# Patient Record
Sex: Female | Born: 1945 | Race: White | Hispanic: No | Smoking: Never smoker
Health system: Southern US, Community
[De-identification: ages and names within clinical notes are randomized; demographics above are authoritative.]

## PROBLEM LIST (undated history)

## (undated) DIAGNOSIS — M069 Rheumatoid arthritis, unspecified: Secondary | ICD-10-CM

## (undated) DIAGNOSIS — E041 Nontoxic single thyroid nodule: Secondary | ICD-10-CM

## (undated) DIAGNOSIS — K429 Umbilical hernia without obstruction or gangrene: Secondary | ICD-10-CM

## (undated) DIAGNOSIS — I5189 Other ill-defined heart diseases: Secondary | ICD-10-CM

## (undated) DIAGNOSIS — J45909 Unspecified asthma, uncomplicated: Secondary | ICD-10-CM

## (undated) DIAGNOSIS — M5090 Cervical disc disorder, unspecified, unspecified cervical region: Secondary | ICD-10-CM

## (undated) DIAGNOSIS — K219 Gastro-esophageal reflux disease without esophagitis: Secondary | ICD-10-CM

## (undated) DIAGNOSIS — G4733 Obstructive sleep apnea (adult) (pediatric): Secondary | ICD-10-CM

## (undated) DIAGNOSIS — N6019 Diffuse cystic mastopathy of unspecified breast: Secondary | ICD-10-CM

## (undated) DIAGNOSIS — J42 Unspecified chronic bronchitis: Secondary | ICD-10-CM

## (undated) DIAGNOSIS — I2699 Other pulmonary embolism without acute cor pulmonale: Secondary | ICD-10-CM

## (undated) DIAGNOSIS — E785 Hyperlipidemia, unspecified: Secondary | ICD-10-CM

## (undated) DIAGNOSIS — K589 Irritable bowel syndrome without diarrhea: Secondary | ICD-10-CM

## (undated) DIAGNOSIS — J449 Chronic obstructive pulmonary disease, unspecified: Secondary | ICD-10-CM

## (undated) DIAGNOSIS — D649 Anemia, unspecified: Secondary | ICD-10-CM

## (undated) DIAGNOSIS — F329 Major depressive disorder, single episode, unspecified: Secondary | ICD-10-CM

## (undated) DIAGNOSIS — J309 Allergic rhinitis, unspecified: Secondary | ICD-10-CM

## (undated) DIAGNOSIS — M2040 Other hammer toe(s) (acquired), unspecified foot: Secondary | ICD-10-CM

## (undated) DIAGNOSIS — M8580 Other specified disorders of bone density and structure, unspecified site: Secondary | ICD-10-CM

## (undated) DIAGNOSIS — N183 Chronic kidney disease, stage 3 unspecified: Secondary | ICD-10-CM

## (undated) DIAGNOSIS — I2510 Atherosclerotic heart disease of native coronary artery without angina pectoris: Secondary | ICD-10-CM

## (undated) DIAGNOSIS — E538 Deficiency of other specified B group vitamins: Secondary | ICD-10-CM

## (undated) DIAGNOSIS — N952 Postmenopausal atrophic vaginitis: Secondary | ICD-10-CM

## (undated) DIAGNOSIS — I471 Supraventricular tachycardia, unspecified: Secondary | ICD-10-CM

## (undated) DIAGNOSIS — R059 Cough, unspecified: Secondary | ICD-10-CM

## (undated) DIAGNOSIS — J039 Acute tonsillitis, unspecified: Secondary | ICD-10-CM

## (undated) DIAGNOSIS — F419 Anxiety disorder, unspecified: Secondary | ICD-10-CM

## (undated) DIAGNOSIS — D539 Nutritional anemia, unspecified: Secondary | ICD-10-CM

## (undated) DIAGNOSIS — M359 Systemic involvement of connective tissue, unspecified: Secondary | ICD-10-CM

## (undated) DIAGNOSIS — L97509 Non-pressure chronic ulcer of other part of unspecified foot with unspecified severity: Secondary | ICD-10-CM

## (undated) DIAGNOSIS — M1990 Unspecified osteoarthritis, unspecified site: Secondary | ICD-10-CM

## (undated) DIAGNOSIS — B029 Zoster without complications: Secondary | ICD-10-CM

## (undated) DIAGNOSIS — F32A Depression, unspecified: Secondary | ICD-10-CM

## (undated) DIAGNOSIS — E669 Obesity, unspecified: Secondary | ICD-10-CM

## (undated) HISTORY — DX: Nutritional anemia, unspecified: D53.9

## (undated) HISTORY — DX: Supraventricular tachycardia, unspecified: I47.10

## (undated) HISTORY — DX: Major depressive disorder, single episode, unspecified: F32.9

## (undated) HISTORY — DX: Chronic kidney disease, stage 3 unspecified: N18.30

## (undated) HISTORY — DX: Cough, unspecified: R05.9

## (undated) HISTORY — DX: Other ill-defined heart diseases: I51.89

## (undated) HISTORY — DX: Obesity, unspecified: E66.9

## (undated) HISTORY — DX: Other hammer toe(s) (acquired), unspecified foot: M20.40

## (undated) HISTORY — DX: Non-pressure chronic ulcer of other part of unspecified foot with unspecified severity: L97.509

## (undated) HISTORY — DX: Gastro-esophageal reflux disease without esophagitis: K21.9

## (undated) HISTORY — DX: Postmenopausal atrophic vaginitis: N95.2

## (undated) HISTORY — DX: Unspecified osteoarthritis, unspecified site: M19.90

## (undated) HISTORY — DX: Hyperlipidemia, unspecified: E78.5

## (undated) HISTORY — DX: Depression, unspecified: F32.A

## (undated) HISTORY — PX: BREAST BIOPSY: SHX20

## (undated) HISTORY — PX: TONSILLECTOMY: SUR1361

## (undated) HISTORY — PX: TUBAL LIGATION: SHX77

## (undated) HISTORY — PX: JOINT REPLACEMENT: SHX530

## (undated) HISTORY — DX: Unspecified chronic bronchitis: J42

## (undated) HISTORY — DX: Irritable bowel syndrome, unspecified: K58.9

## (undated) HISTORY — DX: Obstructive sleep apnea (adult) (pediatric): G47.33

## (undated) HISTORY — DX: Other specified disorders of bone density and structure, unspecified site: M85.80

## (undated) HISTORY — DX: Atherosclerotic heart disease of native coronary artery without angina pectoris: I25.10

## (undated) HISTORY — PX: OTHER SURGICAL HISTORY: SHX169

## (undated) HISTORY — PX: REPLACEMENT UNICONDYLAR JOINT KNEE: SUR1227

## (undated) HISTORY — DX: Allergic rhinitis, unspecified: J30.9

## (undated) HISTORY — DX: Deficiency of other specified B group vitamins: E53.8

## (undated) HISTORY — DX: Anemia, unspecified: D64.9

## (undated) HISTORY — PX: UPPER GI ENDOSCOPY: SHX6162

## (undated) HISTORY — DX: Acute tonsillitis, unspecified: J03.90

## (undated) HISTORY — PX: ESOPHAGOGASTRODUODENOSCOPY ENDOSCOPY: SHX5814

## (undated) HISTORY — PX: HERNIA REPAIR: SHX51

## (undated) HISTORY — DX: Cervical disc disorder, unspecified, unspecified cervical region: M50.90

## (undated) HISTORY — PX: EYE SURGERY: SHX253

## (undated) HISTORY — PX: CERVICAL FUSION: SHX112

## (undated) HISTORY — PX: SHOULDER ARTHROSCOPY WITH ROTATOR CUFF REPAIR: SHX5685

## (undated) HISTORY — PX: COLONOSCOPY: SHX174

---

## 1998-11-30 ENCOUNTER — Encounter: Admission: RE | Admit: 1998-11-30 | Discharge: 1998-11-30 | Payer: Self-pay | Admitting: Neurosurgery

## 1998-11-30 ENCOUNTER — Encounter: Payer: Self-pay | Admitting: Neurosurgery

## 1998-12-02 ENCOUNTER — Ambulatory Visit (HOSPITAL_COMMUNITY): Admission: RE | Admit: 1998-12-02 | Discharge: 1998-12-02 | Payer: Self-pay | Admitting: Neurosurgery

## 1998-12-02 ENCOUNTER — Encounter: Payer: Self-pay | Admitting: Neurosurgery

## 1998-12-12 ENCOUNTER — Encounter: Payer: Self-pay | Admitting: Neurosurgery

## 1998-12-12 ENCOUNTER — Ambulatory Visit (HOSPITAL_COMMUNITY): Admission: RE | Admit: 1998-12-12 | Discharge: 1998-12-12 | Payer: Self-pay | Admitting: Neurosurgery

## 2003-10-20 ENCOUNTER — Ambulatory Visit: Payer: Self-pay | Admitting: Rheumatology

## 2004-01-19 ENCOUNTER — Ambulatory Visit: Payer: Self-pay | Admitting: Ophthalmology

## 2004-02-23 ENCOUNTER — Ambulatory Visit: Payer: Self-pay | Admitting: Ophthalmology

## 2004-07-13 ENCOUNTER — Emergency Department: Payer: Self-pay | Admitting: General Practice

## 2004-10-06 ENCOUNTER — Ambulatory Visit: Payer: Self-pay | Admitting: Unknown Physician Specialty

## 2004-10-18 ENCOUNTER — Ambulatory Visit: Payer: Self-pay | Admitting: Unknown Physician Specialty

## 2005-08-31 ENCOUNTER — Ambulatory Visit: Payer: Self-pay | Admitting: Rheumatology

## 2006-03-13 ENCOUNTER — Ambulatory Visit: Payer: Self-pay | Admitting: Unknown Physician Specialty

## 2006-03-28 ENCOUNTER — Ambulatory Visit: Payer: Self-pay | Admitting: Otolaryngology

## 2006-05-17 ENCOUNTER — Ambulatory Visit: Payer: Self-pay | Admitting: Podiatry

## 2006-10-23 ENCOUNTER — Encounter: Admission: RE | Admit: 2006-10-23 | Discharge: 2006-10-23 | Payer: Self-pay | Admitting: Neurosurgery

## 2007-04-23 ENCOUNTER — Ambulatory Visit: Payer: Self-pay | Admitting: Unknown Physician Specialty

## 2007-05-09 ENCOUNTER — Ambulatory Visit: Payer: Self-pay | Admitting: Anesthesiology

## 2007-05-21 ENCOUNTER — Ambulatory Visit: Payer: Self-pay | Admitting: Anesthesiology

## 2007-06-17 ENCOUNTER — Ambulatory Visit: Payer: Self-pay | Admitting: Anesthesiology

## 2007-06-17 HISTORY — PX: REPLACEMENT TOTAL KNEE: SUR1224

## 2007-06-24 ENCOUNTER — Ambulatory Visit: Payer: Self-pay | Admitting: General Practice

## 2007-07-07 ENCOUNTER — Emergency Department: Payer: Self-pay | Admitting: Emergency Medicine

## 2007-07-07 ENCOUNTER — Other Ambulatory Visit: Payer: Self-pay

## 2007-07-09 ENCOUNTER — Inpatient Hospital Stay: Payer: Self-pay | Admitting: General Practice

## 2007-09-11 ENCOUNTER — Ambulatory Visit: Payer: Self-pay | Admitting: Anesthesiology

## 2008-04-29 ENCOUNTER — Ambulatory Visit: Payer: Self-pay | Admitting: Unknown Physician Specialty

## 2009-01-16 HISTORY — PX: BACK SURGERY: SHX140

## 2009-05-19 ENCOUNTER — Ambulatory Visit: Payer: Self-pay | Admitting: Unknown Physician Specialty

## 2009-06-05 ENCOUNTER — Ambulatory Visit: Payer: Self-pay | Admitting: Rheumatology

## 2009-10-27 ENCOUNTER — Ambulatory Visit: Payer: Self-pay | Admitting: Neurosurgery

## 2009-12-16 HISTORY — PX: LUMBAR DISC SURGERY: SHX700

## 2010-06-07 ENCOUNTER — Ambulatory Visit: Payer: Self-pay | Admitting: Unknown Physician Specialty

## 2010-07-21 ENCOUNTER — Ambulatory Visit: Payer: Self-pay | Admitting: Rheumatology

## 2011-06-19 ENCOUNTER — Ambulatory Visit: Payer: Self-pay | Admitting: Internal Medicine

## 2011-07-17 ENCOUNTER — Ambulatory Visit: Payer: Self-pay | Admitting: Unknown Physician Specialty

## 2011-09-13 ENCOUNTER — Ambulatory Visit: Payer: Self-pay | Admitting: Unknown Physician Specialty

## 2012-03-27 ENCOUNTER — Encounter: Payer: Self-pay | Admitting: *Deleted

## 2012-03-28 ENCOUNTER — Encounter: Payer: Self-pay | Admitting: *Deleted

## 2012-03-29 ENCOUNTER — Encounter: Payer: Self-pay | Admitting: *Deleted

## 2012-04-03 ENCOUNTER — Ambulatory Visit: Payer: Self-pay | Admitting: Internal Medicine

## 2012-04-30 ENCOUNTER — Other Ambulatory Visit: Payer: Self-pay | Admitting: *Deleted

## 2012-04-30 NOTE — Telephone Encounter (Signed)
I have not ever seen this pt.  She will need to call her primary MD for refill until can establish care with Dr Dan Humphreys.

## 2012-04-30 NOTE — Telephone Encounter (Signed)
Pt no showed for 3/19 appt & has appt on 7/2 with Dr. Dan Humphreys. Okay to refill?

## 2012-07-02 ENCOUNTER — Other Ambulatory Visit: Payer: Self-pay | Admitting: Physician Assistant

## 2012-07-17 ENCOUNTER — Ambulatory Visit: Payer: Self-pay | Admitting: Internal Medicine

## 2012-08-08 ENCOUNTER — Other Ambulatory Visit: Payer: Self-pay | Admitting: *Deleted

## 2012-08-09 ENCOUNTER — Other Ambulatory Visit: Payer: Self-pay | Admitting: *Deleted

## 2012-08-13 ENCOUNTER — Other Ambulatory Visit: Payer: Self-pay | Admitting: *Deleted

## 2012-08-14 ENCOUNTER — Other Ambulatory Visit: Payer: Self-pay | Admitting: *Deleted

## 2012-08-14 NOTE — Telephone Encounter (Signed)
I denied this yesterday as well. Pt has not been seen at [facility]. She has cancelled appt with Dr. Dan Humphreys & Dr. Lorin Picket, and no showed for appt with Dr. Lorin Picket. Pt must be seen for any refills or contact previous office.

## 2012-08-14 NOTE — Telephone Encounter (Signed)
Faxed denial for refill to [facility] also with a note

## 2012-10-23 ENCOUNTER — Ambulatory Visit: Payer: Self-pay | Admitting: Physician Assistant

## 2012-10-29 ENCOUNTER — Encounter: Payer: Self-pay | Admitting: Pulmonary Disease

## 2012-10-29 ENCOUNTER — Ambulatory Visit (INDEPENDENT_AMBULATORY_CARE_PROVIDER_SITE_OTHER): Payer: Medicare Other | Admitting: Pulmonary Disease

## 2012-10-29 VITALS — BP 120/76 | HR 69 | Temp 98.3°F | Ht 64.0 in | Wt 210.0 lb

## 2012-10-29 DIAGNOSIS — M069 Rheumatoid arthritis, unspecified: Secondary | ICD-10-CM

## 2012-10-29 DIAGNOSIS — M4802 Spinal stenosis, cervical region: Secondary | ICD-10-CM | POA: Insufficient documentation

## 2012-10-29 DIAGNOSIS — R29898 Other symptoms and signs involving the musculoskeletal system: Secondary | ICD-10-CM

## 2012-10-29 DIAGNOSIS — R0609 Other forms of dyspnea: Secondary | ICD-10-CM

## 2012-10-29 DIAGNOSIS — R0602 Shortness of breath: Secondary | ICD-10-CM | POA: Insufficient documentation

## 2012-10-29 NOTE — Patient Instructions (Signed)
Follow the GERD lifestyle modification sheet we gave you  Use Pepcid at night in addition to the Nexium Take a zyrtec daily Use your flonase regularly, 2 puffs each nostril once a day We will arrange pulmonary function testing, a chest x-ray, and a neurology referral for you  We will see you back in 2-4 weeks or sooner if needed

## 2012-10-29 NOTE — Assessment & Plan Note (Signed)
She is complaining of her current weakness in her right arm. Today on physical exam I cannot detect an obvious difference in strength between her right and left arms. Nor can I identify any other focal abnormality. However, she is very familiar with the symptoms she has had two neck surgeries in the past so it is wise for her to see a neurologist for further evaluation.  Plan: -Refer to neurology

## 2012-10-29 NOTE — Assessment & Plan Note (Signed)
I explained to Courtney Wilcox that there are multiple possible causes of shortness of breath for her. I am encouraged by the fact that her lung exam and oximetry today with ambulation in clinic was totally normal. She needs to have rudimentary pulmonary function testing as well as a chest x-ray for further evaluation of underlying lung disease.  However, I suspect she is going to have totally normal lungs that are trapped inside of an obese body and make it difficult for her to breathe. 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  Deconditioning is also likely contributing to her shortness of breath.  While the differential diagnosis includes rheumatoid related lung disease, I think this is less likely. I also need to see results of her recent stress test as well as a recent hemoglobin level which was checked.  She has had recurrent weakness in her right arm without other obvious focal symptoms. Considering her cervical stenosis she should be evaluated for recurrence of this problem. At this point. It is not clear to me that this is contributing to her shortness of breath.  Plan: -Full pulmonary function testing -Chest x-ray -Recommend a diet and weight loss -see cervical stenosis

## 2012-10-29 NOTE — Progress Notes (Signed)
Subjective:    Patient ID: Courtney Wilcox, female    DOB: 1945/06/12, 67 y.o.   MRN: [id number]  HPI  This is a very pleasant 67 year old female who comes to our clinic today for evaluation of shortness of breath. The symptom has been ongoing for 2-3 years and has been becoming more frequent and more intense in the last year or so. She states that minimal activity will make her short of breath and she frequently has to "breathing hard" while performing activities of daily living. Often strangers in the grocery store will last her if she is okay because she is breathing so hard. She states that walking at anything other than a slow pace will make her short of breath. She is capable of going to the grocery store but she does feel that activities like this that require a lot of walking her becoming more difficult. Vacuuming has become a problem for her lately. She recently had a stress test at [facility] which by report was normal. She said she had to stop because she became so short of breath with exercise. She often feels chest pain and discomfort with exertion. She had neck surgery in the past, one of which was an anterior approach which led to a choking sensation for some time afterwards. She eventually got over this. However she has had a raspy voice and problems with hoarseness since then. After one of her surgeries which was performed at [facility] she had unfortunately a laryngeal injury during intubation in the operating room. This required what sounds like the closure of a laryngeal laceration by Dr. Lenard Simmer [facility].  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 In 2013 she had an esophageal dilatation.  In the last several weeks she has been noticing increasing pain and weakness in her right arm which is similar to her previous cervical spine problems.  She has a lot of sinus drainage which she says is worse with allergic rhinitis.  She is supposed to undergo  a sleep study this week.  She has rheumatoid arthritis and was treated with methotrexate for many years. She's currently not taking that medicine. It does not sound like that was related to the onset or has any significant time course correlation with her dyspnea. To her knowledge she has never had an opportunistic pulmonary infection. She is currently taking Humira for this.   Past Medical History  Diagnosis Date  . Arthritis   . Anemia   . Hyperlipidemia   . Osteoarthrosis     Multi site  . Hammer toe   . Depression   . GERD (gastroesophageal reflux disease)   . IBS (irritable bowel syndrome)   . Tonsillitis     recurrent  . Atrophic vaginitis   . Bronchitis, chronic   . Allergic rhinitis   . Obstructive sleep apnea   . Obesity   . Foot ulcer   . Osteopenia   . Cervical disc disease   . B12 deficiency   . OSA (obstructive sleep apnea)     on CPAP     Family History  Problem Relation Age of Onset  . Asthma Mother   . Emphysema Father     smoked  . Heart disease Father   . Breast cancer Mother   . Lung cancer Father     smoked     History   Social History  . Marital Status: Married    Spouse Name: N/A  Number of Children: N/A  . Years of Education: N/A   Occupational History  . Not on file.   Social History Main Topics  . Smoking status: Never Smoker   . Smokeless tobacco: Never Used  . Alcohol Use: No  . Drug Use: No  . Sexual Activity: Not on file   Other Topics Concern  . Not on file   Social History Narrative  . No narrative on file     Allergies  Allergen Reactions  . Biaxin [Clarithromycin] Other (See Comments)    GI upset   . Sulfa Antibiotics Other (See Comments)    GI upset      Outpatient Prescriptions Prior to Visit  Medication Sig Dispense Refill  . Adalimumab (HUMIRA PEN [place]) Inject every two weeks      . albuterol (PROAIR HFA) 108 (90 BASE) MCG/ACT inhaler Inhale 2 puffs into the lungs every 6 (six) hours as needed for  wheezing.      . fluticasone (FLONASE) 50 MCG/ACT nasal spray Place 2 sprays into the nose daily.      . methotrexate (RHEUMATREX) 2.5 MG tablet Take 8 tablets by mouth once a week with meals (every Wednesday)  Caution:Chemotherapy. Protect from light.      . montelukast (SINGULAIR) 10 MG tablet Take 10 mg by mouth at bedtime.      Marland Kitchen PARoxetine (PAXIL) 20 MG tablet Take 30 mg by mouth every morning.       . traMADol (ULTRAM) 50 MG tablet Take 50 mg by mouth every 4 (four) hours as needed for pain.      Marland Kitchen estradiol (ESTRACE) 0.1 MG/GM vaginal cream Place 2 g vaginally daily.      Marland Kitchen estrogen, conjugated,-medroxyprogesterone (PREMPRO) 0.625-2.5 MG per tablet Take 1 tablet by mouth daily.      . folic acid (FOLVITE) 1 MG tablet Take 1 mg by mouth daily.      . hydrocortisone (ANUSOL-HC) 25 MG suppository Place 25 mg rectally as needed for hemorrhoids.      . hydrocortisone-pramoxine (PROCTOFOAM-HC) rectal foam Place 1 applicator rectally daily.      . Mouthwashes (BL ANTISEPTIC MOUTH WASH MT) [facility]      . naproxen (NAPROSYN) 500 MG tablet Take 500 mg by mouth 2 (two) times daily with a meal.      . omeprazole (PRILOSEC) 20 MG capsule Take 20 mg by mouth 2 (two) times daily.      Marland Kitchen zolpidem (AMBIEN) 10 MG tablet Take 1/2 tablet by mouth at bedtime as needed       No facility-administered medications prior to visit.      Review of Systems  Constitutional: Negative for fever, chills and unexpected weight change.  HENT: Positive for congestion, sinus pressure, sore throat, trouble swallowing and voice change. Negative for dental problem, ear pain, nosebleeds, postnasal drip, rhinorrhea and sneezing.   Eyes: Negative for visual disturbance.  Respiratory: Positive for cough and shortness of breath. Negative for choking.   Cardiovascular: Positive for chest pain. Negative for leg swelling.  Gastrointestinal: Negative for vomiting, abdominal pain and diarrhea.  Genitourinary: Negative for  difficulty urinating.  Musculoskeletal: Positive for arthralgias.  Skin: Negative for rash.  Neurological: Negative for tremors, syncope and headaches.  Hematological: Does not bruise/bleed easily.       Objective:   Physical Exam Filed Vitals:   10/29/12 1518  BP: 120/76  Pulse: 69  Temp: 98.3 F (36.8 C)  TempSrc: Oral  Height: 5\' 4"  (1.626 m)  Weight: 210 lb (95.255 kg)  SpO2: 95%   Gen: obese, no acute distress HEENT: NCAT, PERRL, EOMi, OP clear, neck supple without masses PULM: CTA B CV: RRR, no mgr, no JVD AB: BS+, soft, nontender, no hsm Ext: warm, trace edema, no clubbing, no cyanosis Derm: no rash or skin breakdown Neuro: A&Ox4, CN II-XII intact, strength 5/5 in all 4 extremities       Assessment & Plan:   Shortness of breath I explained to Ms. Cupit that there are multiple possible causes of shortness of breath for her. I am encouraged by the fact that her lung exam and oximetry today with ambulation in clinic was totally normal. She needs to have rudimentary pulmonary function testing as well as a chest x-ray for further evaluation of underlying lung disease.  However, I suspect she is going to have totally normal lungs that are trapped inside of an obese body and make it difficult for her to breathe. 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  Deconditioning is also likely contributing to her shortness of breath.  While the differential diagnosis includes rheumatoid related lung disease, I think this is less likely. I also need to see results of her recent stress test as well as a recent hemoglobin level which was checked.  She has had recurrent weakness in her right arm without other obvious focal symptoms. Considering her cervical stenosis she should be evaluated for recurrence of this problem. At this point. It is not clear to me that this is contributing to her shortness of breath.  Plan: -Full pulmonary  function testing -Chest x-ray -Recommend a diet and weight loss -see cervical stenosis  Spinal stenosis in cervical region She is complaining of her current weakness in her right arm. Today on physical exam I cannot detect an obvious difference in strength between her right and left arms. Nor can I identify any other focal abnormality. However, she is very familiar with the symptoms she has had two neck surgeries in the past so it is wise for her to see a neurologist for further evaluation.  Plan: -Refer to neurology  Rheumatoid arthritis Followed by Dr. Gavin Potters. Previously treated with methotrexate, currently on Humira I doubt there is any manifestation of rheumatoid arthritis causing pulmonary parenchymal disease considering her normal exam and oximetry.  We will see what the PFTs and CXR reveal.    Updated Medication List Outpatient Encounter Prescriptions as of 10/29/2012  Medication Sig Dispense Refill  . Adalimumab (HUMIRA PEN [place]) Inject every two weeks      . albuterol (PROAIR HFA) 108 (90 BASE) MCG/ACT inhaler Inhale 2 puffs into the lungs every 6 (six) hours as needed for wheezing.      Marland Kitchen ALPRAZolam (XANAX) 0.25 MG tablet Take 0.25 mg by mouth at bedtime as needed for sleep.      Marland Kitchen esomeprazole (NEXIUM) 40 MG capsule Take 40 mg by mouth daily before breakfast.      . fluticasone (FLONASE) 50 MCG/ACT nasal spray Place 2 sprays into the nose daily.      . folic acid (FOLVITE) 400 MCG tablet Take 400 mcg by mouth daily.      Marland Kitchen gabapentin (NEURONTIN) 300 MG capsule Take 300 mg by mouth daily.      . methotrexate (RHEUMATREX) 2.5 MG tablet Take 8 tablets by mouth once a week with meals (every Wednesday)  Caution:Chemotherapy. Protect from light.      . montelukast (SINGULAIR) 10 MG  tablet Take 10 mg by mouth at bedtime.      Marland Kitchen PARoxetine (PAXIL) 20 MG tablet Take 30 mg by mouth every morning.       . traMADol (ULTRAM) 50 MG tablet Take 50 mg by mouth every 4 (four) hours as  needed for pain.      . [DISCONTINUED] estradiol (ESTRACE) 0.1 MG/GM vaginal cream Place 2 g vaginally daily.      . [DISCONTINUED] estrogen, conjugated,-medroxyprogesterone (PREMPRO) 0.625-2.5 MG per tablet Take 1 tablet by mouth daily.      . [DISCONTINUED] folic acid (FOLVITE) 1 MG tablet Take 1 mg by mouth daily.      . [DISCONTINUED] hydrocortisone (ANUSOL-HC) 25 MG suppository Place 25 mg rectally as needed for hemorrhoids.      . [DISCONTINUED] hydrocortisone-pramoxine (PROCTOFOAM-HC) rectal foam Place 1 applicator rectally daily.      . [DISCONTINUED] Mouthwashes (BL ANTISEPTIC MOUTH WASH MT) [facility]      . [DISCONTINUED] naproxen (NAPROSYN) 500 MG tablet Take 500 mg by mouth 2 (two) times daily with a meal.      . [DISCONTINUED] omeprazole (PRILOSEC) 20 MG capsule Take 20 mg by mouth 2 (two) times daily.      . [DISCONTINUED] zolpidem (AMBIEN) 10 MG tablet Take 1/2 tablet by mouth at bedtime as needed       No facility-administered encounter medications on file as of 10/29/2012.

## 2012-10-30 DIAGNOSIS — M069 Rheumatoid arthritis, unspecified: Secondary | ICD-10-CM | POA: Insufficient documentation

## 2012-10-30 NOTE — Assessment & Plan Note (Signed)
Followed by Dr. Gavin Potters. Previously treated with methotrexate, currently on Humira I doubt there is any manifestation of rheumatoid arthritis causing pulmonary parenchymal disease considering her normal exam and oximetry.  We will see what the PFTs and CXR reveal.

## 2012-11-19 ENCOUNTER — Ambulatory Visit: Payer: Medicare Other | Admitting: Pulmonary Disease

## 2012-11-20 ENCOUNTER — Ambulatory Visit: Payer: Medicare Other | Admitting: Pulmonary Disease

## 2012-11-28 ENCOUNTER — Ambulatory Visit: Payer: Self-pay | Admitting: Pulmonary Disease

## 2012-11-28 LAB — PULMONARY FUNCTION TEST

## 2012-12-04 ENCOUNTER — Encounter: Payer: Self-pay | Admitting: Pulmonary Disease

## 2012-12-04 ENCOUNTER — Telehealth: Payer: Self-pay | Admitting: Pulmonary Disease

## 2012-12-04 DIAGNOSIS — R0602 Shortness of breath: Secondary | ICD-10-CM

## 2012-12-04 NOTE — Telephone Encounter (Signed)
I called and spoke with pt. Advised her the symbicort is 2 puffs BID. Make sure she washed her mouth out after each use. She voiced her understanding and needed nothing further

## 2012-12-04 NOTE — Telephone Encounter (Signed)
I called Courtney Wilcox to let her know that she had airflow obstruction on her PFT's so I want her to try Symbicort 160/4.5 2 puffs twice a day.  The [place] office is setting aside a sample for her to pickup.

## 2012-12-10 ENCOUNTER — Ambulatory Visit: Payer: Medicare Other | Admitting: Pulmonary Disease

## 2012-12-17 ENCOUNTER — Ambulatory Visit: Payer: Self-pay | Admitting: Physician Assistant

## 2012-12-18 ENCOUNTER — Ambulatory Visit: Payer: Medicare Other | Admitting: Pulmonary Disease

## 2012-12-18 NOTE — Progress Notes (Signed)
No show

## 2012-12-19 ENCOUNTER — Encounter: Payer: Self-pay | Admitting: Pulmonary Disease

## 2012-12-20 ENCOUNTER — Encounter: Payer: Self-pay | Admitting: Pulmonary Disease

## 2013-01-28 ENCOUNTER — Ambulatory Visit: Payer: Medicare Other | Admitting: Pulmonary Disease

## 2013-01-30 ENCOUNTER — Telehealth: Payer: Self-pay | Admitting: Pulmonary Disease

## 2013-01-30 MED ORDER — BUDESONIDE-FORMOTEROL FUMARATE 160-4.5 MCG/ACT IN AERO: 2.0000 | INHALATION_SPRAY | Freq: Two times a day (BID) | RESPIRATORY_TRACT | Status: DC

## 2013-01-30 NOTE — Telephone Encounter (Signed)
Rx has been sent in. Pt is aware. 

## 2013-02-12 ENCOUNTER — Ambulatory Visit: Payer: Self-pay | Admitting: General Practice

## 2013-02-19 ENCOUNTER — Encounter: Payer: Self-pay | Admitting: Pulmonary Disease

## 2013-02-19 ENCOUNTER — Ambulatory Visit (INDEPENDENT_AMBULATORY_CARE_PROVIDER_SITE_OTHER): Payer: Medicare Other | Admitting: Pulmonary Disease

## 2013-02-19 VITALS — BP 120/64 | HR 80 | Ht 64.0 in | Wt 218.0 lb

## 2013-02-19 DIAGNOSIS — J45909 Unspecified asthma, uncomplicated: Secondary | ICD-10-CM | POA: Insufficient documentation

## 2013-02-19 DIAGNOSIS — J8283 Eosinophilic asthma: Secondary | ICD-10-CM | POA: Insufficient documentation

## 2013-02-19 DIAGNOSIS — R079 Chest pain, unspecified: Secondary | ICD-10-CM | POA: Insufficient documentation

## 2013-02-19 DIAGNOSIS — R0602 Shortness of breath: Secondary | ICD-10-CM

## 2013-02-19 MED ORDER — PREDNISONE 10 MG PO TABS: ORAL_TABLET | ORAL | Status: DC

## 2013-02-19 MED ORDER — E-Z SPACER DEVI: Status: DC

## 2013-02-19 NOTE — Patient Instructions (Signed)
Take the prednisone taper as written Use the Symbicort with a spacer Use albuterol as needed for shortness of breath We will schedule a CT scan of your chest We will schedule and appointment with a cardiologist for your chest pain We will see you back in 3 months or sooner if needed

## 2013-02-19 NOTE — Assessment & Plan Note (Signed)
I was surprised to see that Courtney Wilcox had airflow obstruction on her pulmonary function test. We cannot really quantify this because she has concomitant restrictive lung disease as well.  I explained to her today that I think that the most likely etiology for the airflow obstruction is asthma which appears to be mild. However, she does have some wheezing today on exam with associated chest tightness and she has not completely responded to the Symbicort. It is not clear to me that she is taking it appropriately and she did not bring the medicine today to demonstrate to me her technique.  I doubt that she has rheumatoid lung disease as her chest x-ray and oxygenation are normal. However, she is quite nervous about this and considering her restrictive lung disease it would be worthwhile to rule this out.  Plan: -CT chest -Use Symbicort with spacer -Prednisone taper for wheezing

## 2013-02-19 NOTE — Progress Notes (Signed)
Subjective:    Patient ID: Courtney Wilcox, female    DOB: 07-Aug-1945, 68 y.o.   MRN: [id number]  Synopsis: This is a very pleasant 68 year old female with mild intermittent asthma as well as rheumatoid arthritis who came to the [facility] pulmonary clinic in December 2014 for evaluation of shortness of breath. She has taken methotrexate for many years for her rheumatoid arthritis as well as Humira.  HPI  02/19/2013 ROV> Courtney Wilcox states that she feels some tightness in her chest as well as a sharp pain.  Since the last visit she really had been doing well until the chest pain started a few weeks ago.  She started using the Symbicort sample that we gave her since the last visit.  She doesn't think that it helps a lot more than the proAir.  However, it has helped with the chest tightness a little bit. She only uses the proAir daily.  She feels like she can't keep a deep breath with the pain that started two weeks ago.  She has had a lot of thrush from the symbicort.  She had the flu back in December.  No recent fever or chills in the las tfew days, no sick contacts.    Past Medical History  Diagnosis Date  . Arthritis   . Anemia   . Hyperlipidemia   . Osteoarthrosis     Multi site  . Hammer toe   . Depression   . GERD (gastroesophageal reflux disease)   . IBS (irritable bowel syndrome)   . Tonsillitis     recurrent  . Atrophic vaginitis   . Bronchitis, chronic   . Allergic rhinitis   . Obstructive sleep apnea   . Obesity   . Foot ulcer   . Osteopenia   . Cervical disc disease   . B12 deficiency   . OSA (obstructive sleep apnea)     on CPAP     Review of Systems  Constitutional: Positive for fatigue. Negative for fever and chills.  HENT: Negative for postnasal drip, rhinorrhea and sinus pressure.   Respiratory: Positive for cough, shortness of breath and wheezing.   Cardiovascular: Positive for chest pain. Negative for palpitations and leg swelling.        Objective:   Physical Exam Filed Vitals:   02/19/13 1155  BP: 120/64  Pulse: 80  Height: 5\' 4"  (1.626 m)  Weight: 218 lb (98.884 kg)  SpO2: 100%  RA  Gen: anxious, no acute distress HEENT: NCAT, EOMi, OP clear, neck supple without masses PULM: mild wheezing bilaterally CV: RRR, no mgr, no JVD AB: BS+, soft, nontender, no hsm Ext: warm, no edema, no clubbing, no cyanosis Derm: no rash or skin breakdown      Assessment & Plan:   Shortness of breath I was surprised to see that Courtney Wilcox had airflow obstruction on her pulmonary function test. We cannot really quantify this because she has concomitant restrictive lung disease as well.  I explained to her today that I think that the most likely etiology for the airflow obstruction is asthma which appears to be mild. However, she does have some wheezing today on exam with associated chest tightness and she has not completely responded to the Symbicort. It is not clear to me that she is taking it appropriately and she did not bring the medicine today to demonstrate to me her technique.  I doubt that she has rheumatoid lung disease as her chest x-ray and oxygenation are normal. However, she is  quite nervous about this and considering her restrictive lung disease it would be worthwhile to rule this out.  Plan: -CT chest -Use Symbicort with spacer -Prednisone taper for wheezing  Asthma, mild As noted above Prednisone taper Add spacer to Symbicort  Chest pain This chest pain is not classic angina. However it has been sometime since her last stress test and she has previously seen cardiologist for her chest pain. Because she is quite nervous about this I have recommended that she see Dr. Mariah Milling or Dr. Kirke Corin for further evaluation.    Updated Medication List Outpatient Encounter Prescriptions as of 02/19/2013  Medication Sig  . Adalimumab (HUMIRA PEN [place]) Inject every two weeks  . albuterol (PROAIR HFA) 108 (90 BASE) MCG/ACT inhaler  Inhale 2 puffs into the lungs every 6 (six) hours as needed for wheezing.  Marland Kitchen ALPRAZolam (XANAX) 0.25 MG tablet Take 0.25 mg by mouth at bedtime as needed for sleep.  . budesonide-formoterol (SYMBICORT) 160-4.5 MCG/ACT inhaler Inhale 2 puffs into the lungs 2 (two) times daily.  Marland Kitchen esomeprazole (NEXIUM) 40 MG capsule Take 40 mg by mouth daily before breakfast.  . fluticasone (FLONASE) 50 MCG/ACT nasal spray Place 2 sprays into the nose daily.  . folic acid (FOLVITE) 400 MCG tablet Take 400 mcg by mouth daily.  . methotrexate (RHEUMATREX) 2.5 MG tablet Take 8 tablets by mouth once a week with meals (every Wednesday)  Caution:Chemotherapy. Protect from light.  . montelukast (SINGULAIR) 10 MG tablet Take 10 mg by mouth at bedtime.  Marland Kitchen PARoxetine (PAXIL) 20 MG tablet Take 30 mg by mouth every morning.   . traMADol (ULTRAM) 50 MG tablet Take 50 mg by mouth every 4 (four) hours as needed for pain.  . [DISCONTINUED] gabapentin (NEURONTIN) 300 MG capsule Take 300 mg by mouth daily.

## 2013-02-19 NOTE — Assessment & Plan Note (Signed)
As noted above Prednisone taper Add spacer to Symbicort

## 2013-02-19 NOTE — Assessment & Plan Note (Signed)
This chest pain is not classic angina. However it has been sometime since her last stress test and she has previously seen cardiologist for her chest pain. Because she is quite nervous about this I have recommended that she see Dr. Mariah Milling or Dr. Kirke Corin for further evaluation.

## 2013-02-21 ENCOUNTER — Ambulatory Visit: Payer: Medicare Other | Admitting: Cardiovascular Disease

## 2013-02-25 ENCOUNTER — Ambulatory Visit: Payer: Self-pay | Admitting: Pulmonary Disease

## 2013-03-05 ENCOUNTER — Encounter: Payer: Self-pay | Admitting: Pulmonary Disease

## 2013-03-05 ENCOUNTER — Ambulatory Visit: Payer: Medicare Other | Admitting: Cardiovascular Disease

## 2013-03-06 ENCOUNTER — Telehealth: Payer: Self-pay

## 2013-03-06 DIAGNOSIS — E041 Nontoxic single thyroid nodule: Secondary | ICD-10-CM

## 2013-03-06 NOTE — Telephone Encounter (Signed)
Pt aware of results.  She did not previously know about the thyroid nodule.  Per BQ's note, I have placed an order for the thyroid u/s.  Pt aware of this plan.  Nothing further needed at this time.

## 2013-03-06 NOTE — Telephone Encounter (Signed)
Message copied by Velvet Bathe on Thu Mar 06, 2013  4:45 PM ------      Message from: Max Fickle B      Created: Wed Mar 05, 2013 11:03 PM       A,            Please let her know that her CT lungs were normal.  HOwever there was a small thyroid nodule seen and the radiologist recommends an ultrasound to evaluate this.  We can order this if she is not aware of it and her PCP has not evaluated it previously.            Thanks      B ------

## 2013-03-11 ENCOUNTER — Ambulatory Visit (HOSPITAL_COMMUNITY): Payer: Medicare Other

## 2013-03-12 ENCOUNTER — Telehealth: Payer: Self-pay | Admitting: Pulmonary Disease

## 2013-03-12 NOTE — Telephone Encounter (Signed)
lmtcb x1 w/ spouse 

## 2013-03-14 NOTE — Telephone Encounter (Signed)
Called spoke with pt. gave her the # to r/s this. Nothing further needed

## 2013-03-19 ENCOUNTER — Telehealth: Payer: Self-pay | Admitting: Pulmonary Disease

## 2013-03-19 NOTE — Telephone Encounter (Signed)
I called and spoke with pt. She needs to r/s her appt to [facility] for her u/s. Please advise PCC's thanks

## 2013-03-19 NOTE — Telephone Encounter (Signed)
Patient is calling to check on the status of scheduling [place] of neck.  Please call to schedule at [facility].  [phone]

## 2013-03-19 NOTE — Telephone Encounter (Signed)
I called pt. She is going to call her insurance to see what is on her formulary. Will call us back

## 2013-03-19 NOTE — Telephone Encounter (Signed)
Spoke to pt and she wanted to call herself she was given to # to reschedule [place] Sos

## 2013-03-20 NOTE — Telephone Encounter (Signed)
Thyroid US scheduled @[facility]  03/21/13@3 :15pm pt aware 05/21/13

## 2013-03-26 ENCOUNTER — Ambulatory Visit: Payer: Self-pay | Admitting: Pulmonary Disease

## 2013-03-28 ENCOUNTER — Encounter: Payer: Self-pay | Admitting: Pulmonary Disease

## 2013-04-03 ENCOUNTER — Telehealth: Payer: Self-pay | Admitting: Pulmonary Disease

## 2013-04-03 DIAGNOSIS — E041 Nontoxic single thyroid nodule: Secondary | ICD-10-CM

## 2013-04-03 NOTE — Telephone Encounter (Signed)
Pt is aware that we are looking into this. She was just curious about the results, she was not upset.

## 2013-04-03 NOTE — Telephone Encounter (Signed)
In her chart under imaging tab an US soft tissue head/neck order by Morrie Sheldon back in Feb?  Maybe check with Morrie Sheldon

## 2013-04-03 NOTE — Telephone Encounter (Signed)
I've never ordered an ultrasound

## 2013-04-03 NOTE — Telephone Encounter (Signed)
You are right! She had a thyroid ultrasound ordered, but I haven't seen the results. I will check my look at folder and let you guys know by morning.  Sorry for the mix up, missed the phone note

## 2013-04-03 NOTE — Telephone Encounter (Signed)
Pt aware result not in box and will send message to BQ to give ultrasound results she is requesting. BQ please advise on these result thanks

## 2013-04-04 NOTE — Telephone Encounter (Signed)
Message copied by Velvet Bathe on Fri Apr 04, 2013  4:18 PM ------      Message from: Max Fickle B      Created: Fri Apr 04, 2013  6:14 AM       A,            Please let her know that her ultrasound report showed a nodule that is about 3 cm in her thyroid.  I really don't know what this is so she needs to see an endocrinologist.  I recommend Dr. Ellie Lunch in [place].            Thanks      B ------

## 2013-04-04 NOTE — Telephone Encounter (Signed)
LMTCB X1 to relay results to pt.  Have not placed order for endocrinology referral yet, want to speak to pt first.

## 2013-04-07 NOTE — Telephone Encounter (Signed)
Message copied by Velvet Bathe on Mon Apr 07, 2013  1:41 PM ------      Message from: Lupita Leash      Created: Fri Apr 04, 2013  6:14 AM       A,            Please let her know that her ultrasound report showed a nodule that is about 3 cm in her thyroid.  I really don't know what this is so she needs to see an endocrinologist.  I recommend Dr. Ellie Lunch in [place].            Thanks      B ------

## 2013-04-07 NOTE — Telephone Encounter (Signed)
Pt aware of results.  Referral to endocrinology placed.  Nothing further needed.

## 2013-04-09 ENCOUNTER — Telehealth: Payer: Self-pay | Admitting: Pulmonary Disease

## 2013-04-09 NOTE — Telephone Encounter (Signed)
Pt calling to check on statis of someone calling her to set up appointment at endo. Advised pt of Doctor's name and address and phone #. Also, told her someone would call her to set up appointment

## 2013-04-10 ENCOUNTER — Telehealth: Payer: Self-pay | Admitting: Pulmonary Disease

## 2013-04-10 NOTE — Telephone Encounter (Signed)
Spoke with the Courtney Wilcox  She states that her PCP prefers that she see Dr Rosalita Chessman rather than Dr Renae Fickle for her thyroid problem  Is this okay with you? Please advise thanks!

## 2013-04-11 NOTE — Telephone Encounter (Signed)
Pt is aware that BQ is okay with patient seeing Dr Rosalita Chessman instead of Dr Renae Fickle. I spoke with Junious Dresser at Dr Ollen Gross office in regards to changing pt's referral sent today-we need the referral to go to Dr Rosalita Chessman. Junious Dresser has made a note of this and will call our office back if any questions or concerns. Junious Dresser is covering for the usual referral employee today.

## 2013-04-11 NOTE — Telephone Encounter (Signed)
OK 

## 2013-05-15 ENCOUNTER — Ambulatory Visit: Payer: Self-pay | Admitting: General Practice

## 2013-05-15 DIAGNOSIS — R0602 Shortness of breath: Secondary | ICD-10-CM

## 2013-05-15 DIAGNOSIS — E669 Obesity, unspecified: Secondary | ICD-10-CM

## 2013-05-15 LAB — CBC WITH DIFFERENTIAL/PLATELET
BASOS ABS: 0.1 10*3/uL (ref 0.0–0.1)
Basophil %: 0.9 %
EOS PCT: 8.3 %
Eosinophil #: 0.5 10*3/uL (ref 0.0–0.7)
HCT: 34 % — AB (ref 35.0–47.0)
HGB: 11.1 g/dL — AB (ref 12.0–16.0)
LYMPHS ABS: 1.5 10*3/uL (ref 1.0–3.6)
LYMPHS PCT: 24.3 %
MCH: 30.7 pg (ref 26.0–34.0)
MCHC: 32.7 g/dL (ref 32.0–36.0)
MCV: 94 fL (ref 80–100)
Monocyte #: 0.6 x10 3/mm (ref 0.2–0.9)
Monocyte %: 9.6 %
NEUTROS ABS: 3.6 10*3/uL (ref 1.4–6.5)
Neutrophil %: 56.9 %
Platelet: 165 10*3/uL (ref 150–440)
RBC: 3.62 10*6/uL — ABNORMAL LOW (ref 3.80–5.20)
RDW: 13.9 % (ref 11.5–14.5)
WBC: 6.3 10*3/uL (ref 3.6–11.0)

## 2013-05-19 ENCOUNTER — Encounter: Payer: Self-pay | Admitting: Pulmonary Disease

## 2013-05-28 ENCOUNTER — Ambulatory Visit: Payer: Self-pay | Admitting: General Practice

## 2013-07-08 DIAGNOSIS — M8580 Other specified disorders of bone density and structure, unspecified site: Secondary | ICD-10-CM | POA: Insufficient documentation

## 2013-07-08 DIAGNOSIS — M1990 Unspecified osteoarthritis, unspecified site: Secondary | ICD-10-CM | POA: Insufficient documentation

## 2013-07-30 ENCOUNTER — Encounter: Payer: Self-pay | Admitting: Internal Medicine

## 2013-10-30 DIAGNOSIS — D649 Anemia, unspecified: Secondary | ICD-10-CM | POA: Insufficient documentation

## 2013-10-30 DIAGNOSIS — F419 Anxiety disorder, unspecified: Secondary | ICD-10-CM | POA: Insufficient documentation

## 2013-10-30 DIAGNOSIS — D638 Anemia in other chronic diseases classified elsewhere: Secondary | ICD-10-CM | POA: Insufficient documentation

## 2013-10-30 DIAGNOSIS — D539 Nutritional anemia, unspecified: Secondary | ICD-10-CM | POA: Insufficient documentation

## 2013-12-18 ENCOUNTER — Ambulatory Visit: Payer: Self-pay | Admitting: Gastroenterology

## 2014-02-13 ENCOUNTER — Ambulatory Visit: Payer: Self-pay

## 2014-02-13 ENCOUNTER — Ambulatory Visit: Payer: Self-pay | Admitting: Physician Assistant

## 2014-04-30 ENCOUNTER — Institutional Professional Consult (permissible substitution): Payer: Medicare Other | Admitting: Internal Medicine

## 2014-04-30 ENCOUNTER — Encounter: Payer: Self-pay | Admitting: *Deleted

## 2014-05-09 NOTE — Op Note (Signed)
PATIENT NAME:  Courtney Wilcox, Courtney Wilcox MR#:  [id number] DATE OF BIRTH:  1945/04/29  DATE OF PROCEDURE:  05/28/2013  PREOPERATIVE DIAGNOSIS: Right rotator cuff tear.   POSTOPERATIVE DIAGNOSIS: Tear of the supraspinatus tendon of the right shoulder.   PROCEDURE PERFORMED: Right subacromial decompression and rotator cuff repair.   SURGEON: Illene Labrador. Angie Fava., MD  ANESTHESIA: General.   ESTIMATED BLOOD LOSS: Minimal.   FLUIDS REPLACED: 1200 mL of crystalloid.   DRAINS: None.   IMPLANTS UTILIZED: ArthroCare Spartan 5.5 mm PEEK suture anchors x2.   INDICATIONS FOR SURGERY: The patient is a 69 year old female who has been seen for complaints of progressive right shoulder pain. MRI demonstrated findings consistent with full-thickness tear of the supraspinatus tendon. After discussion of the risks and benefits of surgical intervention, the patient expressed understanding of the risks and benefits and agreed with plans for surgical intervention.   PROCEDURE IN DETAIL: The patient was brought into the operating room and, after adequate general anesthesia was achieved, the patient was placed in the modified beach chair position. Head was secured in a headrest, and all bony prominences were well-padded. The patient's right shoulder and arm were cleaned and prepped with alcohol and DuraPrep and draped in the usual sterile fashion. A "timeout" was performed as per usual protocol. The anticipated incision site was injected with 0.25% Marcaine with epinephrine. An anterior oblique incision was made roughly bisecting the anterior aspect of the acromion. Fibers of the deltoid were split in line, and a "deltoid on" approach was utilized by elevating the deltoid in a subperiosteal fashion off of the acromion. The subdeltoid bursa was noted to be thickened and inflamed. Subdeltoid bursectomy was performed. A Darrach retractor was inserted so as to protect the cuff. An anterior-inferior wafer of bone was removed from the  acromion using an osteotome. Thickness of the fragment was approximately 3 to 4 mm. Additional debridement and contouring were performed using a TPS high-speed rasp. The wound was irrigated with copious amounts of normal saline with antibiotic solution. The shoulder was placed through range of motion, and there was noted to be a large full-thickness tear of the supraspinatus tendon with retraction. The supraspinatus tendon was mobilized and could be advanced to its insertion site. The bed of the insertion site was debrided using rongeurs. Two Spartan 5.5 mm PEEK suture anchors were inserted. The supraspinatus tendon was then repaired and anchored into place using the associated sutures. The shoulder was placed through range of motion, with good maintenance of the repair appreciated and good decompression of the subacromial space. The wound was irrigated with copious amounts of normal saline with antibiotic solution. The deltoid was repaired in a side-to-side fashion using interrupted sutures of #1 Ethibond. The subcutaneous tissue was approximated in layers using first #0 Vicryl, followed by #2-0 Vicryl. The skin was closed with a running subcuticular suture of #4-0 Vicryl. Steri-Strips were applied. Marcaine 0.25% with epinephrine was injected along the incision site as well as into the subacromial region. A sterile dressing was applied, followed by application of ice wrap.   The patient tolerated the procedure well. She was transported to the recovery room in stable condition.    ____________________________ Illene Labrador. Angie Fava., MD jph:lb D: 05/29/2013 09:13:50 ET T: 05/29/2013 09:43:42 ET JOB#: [id number]  cc: Fayrene Fearing P. Angie Fava., MD, <Dictator> JAMES P Angie Fava MD ELECTRONICALLY SIGNED 06/09/2013 6:52

## 2014-08-13 ENCOUNTER — Ambulatory Visit: Payer: Self-pay

## 2014-08-14 ENCOUNTER — Ambulatory Visit: Payer: Self-pay

## 2014-09-01 ENCOUNTER — Ambulatory Visit: Payer: Self-pay | Admitting: Urology

## 2014-09-15 ENCOUNTER — Ambulatory Visit: Payer: Self-pay | Admitting: Urology

## 2014-11-18 ENCOUNTER — Other Ambulatory Visit: Payer: Self-pay | Admitting: Physical Medicine and Rehabilitation

## 2014-11-18 DIAGNOSIS — M5416 Radiculopathy, lumbar region: Secondary | ICD-10-CM

## 2014-12-01 ENCOUNTER — Ambulatory Visit
Admission: RE | Admit: 2014-12-01 | Discharge: 2014-12-01 | Disposition: A | Discharge status: Home / Self Care | Payer: Medicare Other | Source: Ambulatory Visit | Attending: Physical Medicine and Rehabilitation | Admitting: Physical Medicine and Rehabilitation

## 2014-12-01 DIAGNOSIS — M4806 Spinal stenosis, lumbar region: Secondary | ICD-10-CM | POA: Insufficient documentation

## 2014-12-01 DIAGNOSIS — M47896 Other spondylosis, lumbar region: Secondary | ICD-10-CM | POA: Diagnosis not present

## 2014-12-01 DIAGNOSIS — M545 Low back pain: Secondary | ICD-10-CM | POA: Diagnosis present

## 2014-12-01 DIAGNOSIS — M5416 Radiculopathy, lumbar region: Secondary | ICD-10-CM

## 2014-12-30 ENCOUNTER — Other Ambulatory Visit: Payer: Self-pay | Admitting: Physician Assistant

## 2014-12-30 DIAGNOSIS — Z1231 Encounter for screening mammogram for malignant neoplasm of breast: Secondary | ICD-10-CM

## 2015-02-15 ENCOUNTER — Other Ambulatory Visit: Payer: Self-pay | Admitting: Pulmonary Disease

## 2015-02-15 ENCOUNTER — Ambulatory Visit: Payer: Medicare Other

## 2015-02-17 ENCOUNTER — Other Ambulatory Visit: Payer: Self-pay | Admitting: Pulmonary Disease

## 2015-02-18 ENCOUNTER — Other Ambulatory Visit: Payer: Self-pay | Admitting: Physician Assistant

## 2015-02-18 ENCOUNTER — Ambulatory Visit
Admission: RE | Admit: 2015-02-18 | Discharge: 2015-02-18 | Disposition: A | Discharge status: Home / Self Care | Payer: Medicare Other | Source: Ambulatory Visit | Attending: Physician Assistant | Admitting: Physician Assistant

## 2015-02-18 DIAGNOSIS — Z1231 Encounter for screening mammogram for malignant neoplasm of breast: Secondary | ICD-10-CM | POA: Insufficient documentation

## 2015-05-27 ENCOUNTER — Ambulatory Visit: Payer: Self-pay | Admitting: Urology

## 2015-06-10 ENCOUNTER — Ambulatory Visit: Payer: Self-pay | Admitting: Urology

## 2015-09-06 ENCOUNTER — Other Ambulatory Visit: Payer: Self-pay | Admitting: Physical Medicine and Rehabilitation

## 2015-09-06 DIAGNOSIS — M47816 Spondylosis without myelopathy or radiculopathy, lumbar region: Secondary | ICD-10-CM

## 2015-09-17 ENCOUNTER — Ambulatory Visit
Admission: RE | Admit: 2015-09-17 | Discharge: 2015-09-17 | Disposition: A | Discharge status: Home / Self Care | Payer: Medicare Other | Source: Ambulatory Visit | Attending: Physical Medicine and Rehabilitation | Admitting: Physical Medicine and Rehabilitation

## 2015-09-17 DIAGNOSIS — M5126 Other intervertebral disc displacement, lumbar region: Secondary | ICD-10-CM | POA: Diagnosis not present

## 2015-09-17 DIAGNOSIS — M4806 Spinal stenosis, lumbar region: Secondary | ICD-10-CM | POA: Insufficient documentation

## 2015-09-17 DIAGNOSIS — M47816 Spondylosis without myelopathy or radiculopathy, lumbar region: Secondary | ICD-10-CM | POA: Diagnosis present

## 2016-01-14 ENCOUNTER — Other Ambulatory Visit: Payer: Self-pay | Admitting: Physician Assistant

## 2016-01-14 DIAGNOSIS — Z1239 Encounter for other screening for malignant neoplasm of breast: Secondary | ICD-10-CM

## 2016-02-21 ENCOUNTER — Institutional Professional Consult (permissible substitution): Payer: Medicare Other | Admitting: Internal Medicine

## 2016-04-03 ENCOUNTER — Institutional Professional Consult (permissible substitution): Payer: Medicare Other | Admitting: Internal Medicine

## 2016-04-06 ENCOUNTER — Other Ambulatory Visit: Payer: Self-pay | Admitting: Physician Assistant

## 2016-04-06 DIAGNOSIS — R1011 Right upper quadrant pain: Secondary | ICD-10-CM

## 2016-04-06 DIAGNOSIS — R110 Nausea: Secondary | ICD-10-CM

## 2016-04-14 ENCOUNTER — Ambulatory Visit
Admission: RE | Admit: 2016-04-14 | Discharge: 2016-04-14 | Disposition: A | Discharge status: Home / Self Care | Payer: Medicare Other | Source: Ambulatory Visit | Attending: Physician Assistant | Admitting: Physician Assistant

## 2016-04-14 DIAGNOSIS — R1011 Right upper quadrant pain: Secondary | ICD-10-CM | POA: Diagnosis not present

## 2016-04-14 DIAGNOSIS — R110 Nausea: Secondary | ICD-10-CM

## 2016-04-18 ENCOUNTER — Ambulatory Visit: Payer: Medicare Other

## 2016-05-09 ENCOUNTER — Institutional Professional Consult (permissible substitution): Payer: Medicare Other | Admitting: Internal Medicine

## 2016-05-17 ENCOUNTER — Ambulatory Visit
Admission: RE | Admit: 2016-05-17 | Discharge: 2016-05-17 | Disposition: A | Discharge status: Home / Self Care | Payer: Medicare Other | Source: Ambulatory Visit | Attending: Physician Assistant | Admitting: Physician Assistant

## 2016-05-17 DIAGNOSIS — Z1239 Encounter for other screening for malignant neoplasm of breast: Secondary | ICD-10-CM

## 2016-05-17 DIAGNOSIS — Z1231 Encounter for screening mammogram for malignant neoplasm of breast: Secondary | ICD-10-CM | POA: Diagnosis not present

## 2016-06-28 ENCOUNTER — Encounter
Admission: RE | Admit: 2016-06-28 | Discharge: 2016-06-28 | Disposition: A | Discharge status: Home / Self Care | Payer: Medicare Other | Source: Ambulatory Visit | Attending: Orthopedic Surgery | Admitting: Orthopedic Surgery

## 2016-06-28 ENCOUNTER — Inpatient Hospital Stay: Admission: RE | Admit: 2016-06-28 | Payer: Medicare Other | Source: Ambulatory Visit

## 2016-06-28 DIAGNOSIS — Z01818 Encounter for other preprocedural examination: Secondary | ICD-10-CM | POA: Diagnosis present

## 2016-06-28 HISTORY — DX: Anxiety disorder, unspecified: F41.9

## 2016-06-28 LAB — URINALYSIS, ROUTINE W REFLEX MICROSCOPIC
BACTERIA UA: NONE SEEN
BILIRUBIN URINE: NEGATIVE
Glucose, UA: NEGATIVE mg/dL
HGB URINE DIPSTICK: NEGATIVE
KETONES UR: NEGATIVE mg/dL
Nitrite: NEGATIVE
Protein, ur: NEGATIVE mg/dL
RBC / HPF: NONE SEEN RBC/hpf (ref 0–5)
SPECIFIC GRAVITY, URINE: 1.029 (ref 1.005–1.030)
pH: 5 (ref 5.0–8.0)

## 2016-06-28 LAB — CBC
HEMATOCRIT: 35.6 % (ref 35.0–47.0)
HEMOGLOBIN: 12.4 g/dL (ref 12.0–16.0)
MCH: 31.9 pg (ref 26.0–34.0)
MCHC: 34.8 g/dL (ref 32.0–36.0)
MCV: 91.6 fL (ref 80.0–100.0)
Platelets: 187 10*3/uL (ref 150–440)
RBC: 3.89 MIL/uL (ref 3.80–5.20)
RDW: 13.7 % (ref 11.5–14.5)
WBC: 7.1 10*3/uL (ref 3.6–11.0)

## 2016-06-28 LAB — COMPREHENSIVE METABOLIC PANEL
ALK PHOS: 87 U/L (ref 38–126)
ALT: 18 U/L (ref 14–54)
ANION GAP: 6 (ref 5–15)
AST: 25 U/L (ref 15–41)
Albumin: 3.7 g/dL (ref 3.5–5.0)
BILIRUBIN TOTAL: 0.6 mg/dL (ref 0.3–1.2)
BUN: 13 mg/dL (ref 6–20)
CALCIUM: 9.1 mg/dL (ref 8.9–10.3)
CO2: 27 mmol/L (ref 22–32)
Chloride: 106 mmol/L (ref 101–111)
Creatinine, Ser: 1 mg/dL (ref 0.44–1.00)
GFR, EST NON AFRICAN AMERICAN: 57 mL/min — AB (ref >60)
Glucose, Bld: 86 mg/dL (ref 65–99)
Potassium: 3.3 mmol/L — ABNORMAL LOW (ref 3.5–5.1)
Sodium: 139 mmol/L (ref 135–145)
TOTAL PROTEIN: 6.6 g/dL (ref 6.5–8.1)

## 2016-06-28 LAB — PROTIME-INR
INR: 0.96
PROTHROMBIN TIME: 12.8 s (ref 11.4–15.2)

## 2016-06-28 LAB — SEDIMENTATION RATE: SED RATE: 26 mm/h (ref 0–30)

## 2016-06-28 LAB — SURGICAL PCR SCREEN
MRSA, PCR: NEGATIVE
Staphylococcus aureus: POSITIVE — AB

## 2016-06-28 LAB — APTT: aPTT: 26 seconds (ref 24–36)

## 2016-06-28 LAB — C-REACTIVE PROTEIN: CRP: 1 mg/dL — AB (ref <1.0)

## 2016-06-28 NOTE — Patient Instructions (Signed)
Your procedure is scheduled on: July 12, 2016 [facility]) Report to Same Day Surgery 2nd floor medical mall (Medical Mall Entrance-take elevator on left to 2nd floor.  Check in with surgery information desk.) To find out your arrival time please call [phone] between 1PM - 3PM on July 11, 2016 [facility])   Remember: Instructions that are not followed completely may result in serious medical risk, up to and including death, or upon the discretion of your surgeon and anesthesiologist your surgery may need to be rescheduled.    _x___ 1. Do not eat food or drink liquids after midnight. No gum chewing or hard candies                               __x__ 2. No Alcohol for 24 hours before or after surgery.   __x__3. No Smoking for 24 prior to surgery.   ____  4. Bring all medications with you on the day of surgery if instructed.    __x__ 5. Notify your doctor if there is any change in your medical condition     (cold, fever, infections).     Do not wear jewelry, make-up, hairpins, clips or nail polish.  Do not wear lotions, powders, or perfumes.   Do not shave 48 hours prior to surgery. Men may shave face and neck.  Do not bring valuables to the hospital.    [facility] is not responsible for any belongings or valuables.               Contacts, dentures or bridgework may not be worn into surgery.  Leave your suitcase in the car. After surgery it may be brought to your room.  For patients admitted to the hospital, discharge time is determined by your  treatment team                       Patients discharged the day of surgery will not be allowed to drive home.  You will need someone to drive you home and stay with you the night of your procedure.    Please read over the following fact sheets that you were given:   [facility] Preparing for Surgery and or MRSA Information   _x___ Take the following medications the morning of surgery with a sip of water :l  1.  CELEXA  2.    OMEPRAZOLE  3.  4.  5.  6.  ____Fleets enema or Magnesium Citrate as directed.   _x___ Use CHG Soap or sage wipes as directed on instruction sheet   _x___ Use inhalers on the day of surgery and bring to hospital day of surgery (USE SYMBICORT INHALER WITH SPACER THE MORNING OF SURGERY AND BRING TO HOSPITAL)  ____ Stop Metformin and Janumet 2 days prior to surgery.    ____ Take 1/2 of usual insulin dose the night before surgery and none on the morning  surgery     _x___ Follow recommendations from Cardiologist, Pulmonologist or PCP regarding          stopping Aspirin, Coumadin, Plavix ,Eliquis, Effient, or Pradaxa, and Pletal.  X____Stop Anti-inflammatories such as Advil, Aleve, Ibuprofen, Motrin, Naproxen, Naprosyn, Goodies powders or aspirin products. OK to take Tylenol  (STOP MELOXICAM ONE WEEK PRIOR TO SURGERY )   _x___ Stop supplements until after surgery.  But may continue Vitamin D, Vitamin B,  and multivitamin       ____  Bring C-Pap to the hospital.

## 2016-06-29 LAB — URINE CULTURE
Culture: <10000 — AB
Special Requests: NORMAL

## 2016-06-29 LAB — TYPE AND SCREEN
ABO/RH(D): O POS
Antibody Screen: NEGATIVE

## 2016-06-29 NOTE — Pre-Procedure Instructions (Signed)
Called kt 3.3 and positive staph called to tiffany at dr hooten,s

## 2016-07-12 ENCOUNTER — Encounter: Payer: Self-pay | Admitting: Orthopedic Surgery

## 2016-07-12 ENCOUNTER — Inpatient Hospital Stay
Admission: RE | Admit: 2016-07-12 | Discharge: 2016-07-14 | DRG: 470 | Disposition: A | Discharge status: Home / Self Care | Payer: Medicare Other | Source: Ambulatory Visit | Attending: Orthopedic Surgery | Admitting: Orthopedic Surgery

## 2016-07-12 ENCOUNTER — Inpatient Hospital Stay: Payer: Medicare Other | Admitting: Anesthesiology

## 2016-07-12 ENCOUNTER — Encounter
Admission: RE | Disposition: A | Discharge status: Home / Self Care | Payer: Self-pay | Source: Ambulatory Visit | Attending: Orthopedic Surgery

## 2016-07-12 ENCOUNTER — Inpatient Hospital Stay: Payer: Medicare Other

## 2016-07-12 DIAGNOSIS — D649 Anemia, unspecified: Secondary | ICD-10-CM | POA: Diagnosis present

## 2016-07-12 DIAGNOSIS — K589 Irritable bowel syndrome without diarrhea: Secondary | ICD-10-CM | POA: Diagnosis present

## 2016-07-12 DIAGNOSIS — Z79899 Other long term (current) drug therapy: Secondary | ICD-10-CM | POA: Diagnosis not present

## 2016-07-12 DIAGNOSIS — G4733 Obstructive sleep apnea (adult) (pediatric): Secondary | ICD-10-CM | POA: Diagnosis present

## 2016-07-12 DIAGNOSIS — M1712 Unilateral primary osteoarthritis, left knee: Principal | ICD-10-CM | POA: Diagnosis present

## 2016-07-12 DIAGNOSIS — F329 Major depressive disorder, single episode, unspecified: Secondary | ICD-10-CM | POA: Diagnosis present

## 2016-07-12 DIAGNOSIS — Z96659 Presence of unspecified artificial knee joint: Secondary | ICD-10-CM

## 2016-07-12 DIAGNOSIS — E669 Obesity, unspecified: Secondary | ICD-10-CM | POA: Diagnosis present

## 2016-07-12 DIAGNOSIS — Z888 Allergy status to other drugs, medicaments and biological substances status: Secondary | ICD-10-CM

## 2016-07-12 DIAGNOSIS — K219 Gastro-esophageal reflux disease without esophagitis: Secondary | ICD-10-CM | POA: Diagnosis present

## 2016-07-12 HISTORY — PX: KNEE ARTHROPLASTY: SHX992

## 2016-07-12 LAB — POCT I-STAT 4, (NA,K, GLUC, HGB,HCT)
GLUCOSE: 89 mg/dL (ref 65–99)
HEMATOCRIT: 32 % — AB (ref 36.0–46.0)
Hemoglobin: 10.9 g/dL — ABNORMAL LOW (ref 12.0–15.0)
POTASSIUM: 3.2 mmol/L — AB (ref 3.5–5.1)
SODIUM: 142 mmol/L (ref 135–145)

## 2016-07-12 LAB — ABO/RH: ABO/RH(D): O POS

## 2016-07-12 SURGERY — ARTHROPLASTY, KNEE, TOTAL, USING IMAGELESS COMPUTER-ASSISTED NAVIGATION
Anesthesia: General | Site: Knee | Laterality: Left | Wound class: Clean

## 2016-07-12 MED ORDER — ROCURONIUM BROMIDE 100 MG/10ML IV SOLN
INTRAVENOUS | Status: DC | PRN
  Administered 2016-07-12 (×2): 50 mg via INTRAVENOUS

## 2016-07-12 MED ORDER — TAPENTADOL HCL 50 MG PO TABS
50.0000 mg | ORAL_TABLET | ORAL | Status: DC | PRN
  Administered 2016-07-12 (×2): 50 mg via ORAL
  Administered 2016-07-13: 100 mg via ORAL
  Administered 2016-07-14: 50 mg via ORAL
  Filled 2016-07-12 (×3): qty 1
  Filled 2016-07-12: qty 2

## 2016-07-12 MED ORDER — LIDOCAINE HCL (PF) 2 % IJ SOLN
INTRAMUSCULAR | Status: AC
  Filled 2016-07-12: qty 2

## 2016-07-12 MED ORDER — LACTATED RINGERS IV SOLN
INTRAVENOUS | Status: DC
  Administered 2016-07-12 (×2): via INTRAVENOUS

## 2016-07-12 MED ORDER — PANTOPRAZOLE SODIUM 40 MG PO TBEC
40.0000 mg | DELAYED_RELEASE_TABLET | Freq: Two times a day (BID) | ORAL | Status: DC
  Administered 2016-07-12 – 2016-07-14 (×3): 40 mg via ORAL
  Filled 2016-07-12 (×4): qty 1

## 2016-07-12 MED ORDER — ENOXAPARIN SODIUM 30 MG/0.3ML ~~LOC~~ SOLN
30.0000 mg | Freq: Two times a day (BID) | SUBCUTANEOUS | Status: DC
  Administered 2016-07-13 – 2016-07-14 (×2): 30 mg via SUBCUTANEOUS
  Filled 2016-07-12 (×2): qty 0.3

## 2016-07-12 MED ORDER — MORPHINE SULFATE (PF) 2 MG/ML IV SOLN
2.0000 mg | INTRAVENOUS | Status: DC | PRN
  Administered 2016-07-12: 2 mg via INTRAVENOUS
  Filled 2016-07-12: qty 1

## 2016-07-12 MED ORDER — SENNOSIDES-DOCUSATE SODIUM 8.6-50 MG PO TABS
1.0000 | ORAL_TABLET | Freq: Two times a day (BID) | ORAL | Status: DC
  Administered 2016-07-13: 1 via ORAL
  Filled 2016-07-12 (×3): qty 1

## 2016-07-12 MED ORDER — FERROUS SULFATE 325 (65 FE) MG PO TABS
325.0000 mg | ORAL_TABLET | Freq: Two times a day (BID) | ORAL | Status: DC
  Administered 2016-07-12 – 2016-07-13 (×2): 325 mg via ORAL
  Filled 2016-07-12 (×3): qty 1

## 2016-07-12 MED ORDER — NEOMYCIN-POLYMYXIN B GU 40-200000 IR SOLN
Status: DC | PRN
  Administered 2016-07-12: 14 mL

## 2016-07-12 MED ORDER — SODIUM CHLORIDE 0.9 % IV SOLN
INTRAVENOUS | Status: DC
  Administered 2016-07-12 – 2016-07-13 (×2): via INTRAVENOUS

## 2016-07-12 MED ORDER — ONDANSETRON HCL 4 MG/2ML IJ SOLN: 4.0000 mg | Freq: Once | INTRAMUSCULAR | Status: DC | PRN

## 2016-07-12 MED ORDER — ALBUTEROL SULFATE HFA 108 (90 BASE) MCG/ACT IN AERS: 2.0000 | INHALATION_SPRAY | Freq: Four times a day (QID) | RESPIRATORY_TRACT | Status: DC | PRN

## 2016-07-12 MED ORDER — GLYCOPYRROLATE 0.2 MG/ML IJ SOLN
INTRAMUSCULAR | Status: AC
  Filled 2016-07-12: qty 1

## 2016-07-12 MED ORDER — BUPIVACAINE HCL (PF) 0.25 % IJ SOLN
INTRAMUSCULAR | Status: AC
  Filled 2016-07-12: qty 60

## 2016-07-12 MED ORDER — FENTANYL CITRATE (PF) 100 MCG/2ML IJ SOLN
INTRAMUSCULAR | Status: AC
  Filled 2016-07-12: qty 2

## 2016-07-12 MED ORDER — METOPROLOL TARTRATE 5 MG/5ML IV SOLN
INTRAVENOUS | Status: DC | PRN
  Administered 2016-07-12: 3 mg via INTRAVENOUS

## 2016-07-12 MED ORDER — CEFAZOLIN SODIUM-DEXTROSE 2-4 GM/100ML-% IV SOLN: 2.0000 g | INTRAVENOUS | Status: DC

## 2016-07-12 MED ORDER — MIDAZOLAM HCL 2 MG/2ML IJ SOLN
INTRAMUSCULAR | Status: AC
  Filled 2016-07-12: qty 2

## 2016-07-12 MED ORDER — SODIUM CHLORIDE 0.9 % IV SOLN
INTRAVENOUS | Status: DC | PRN
  Administered 2016-07-12: 60 mL

## 2016-07-12 MED ORDER — ALPRAZOLAM 0.5 MG PO TABS: 0.5000 mg | ORAL_TABLET | Freq: Every evening | ORAL | Status: DC | PRN

## 2016-07-12 MED ORDER — SODIUM CHLORIDE 0.9 % IJ SOLN
INTRAMUSCULAR | Status: AC
  Filled 2016-07-12: qty 50

## 2016-07-12 MED ORDER — ACETAMINOPHEN 10 MG/ML IV SOLN
INTRAVENOUS | Status: DC | PRN
  Administered 2016-07-12: 1000 mg via INTRAVENOUS

## 2016-07-12 MED ORDER — NEOMYCIN-POLYMYXIN B GU 40-200000 IR SOLN
Status: AC
  Filled 2016-07-12: qty 20

## 2016-07-12 MED ORDER — FENTANYL CITRATE (PF) 250 MCG/5ML IJ SOLN
INTRAMUSCULAR | Status: AC
  Filled 2016-07-12: qty 5

## 2016-07-12 MED ORDER — CEFAZOLIN SODIUM-DEXTROSE 2-4 GM/100ML-% IV SOLN
INTRAVENOUS | Status: AC
  Filled 2016-07-12: qty 100

## 2016-07-12 MED ORDER — BUPIVACAINE LIPOSOME 1.3 % IJ SUSP
INTRAMUSCULAR | Status: AC
  Filled 2016-07-12: qty 20

## 2016-07-12 MED ORDER — ONDANSETRON HCL 4 MG/2ML IJ SOLN
INTRAMUSCULAR | Status: DC | PRN
  Administered 2016-07-12: 4 mg via INTRAVENOUS

## 2016-07-12 MED ORDER — MAGNESIUM HYDROXIDE 400 MG/5ML PO SUSP: 30.0000 mL | Freq: Every day | ORAL | Status: DC | PRN

## 2016-07-12 MED ORDER — FLEET ENEMA 7-19 GM/118ML RE ENEM: 1.0000 | ENEMA | Freq: Once | RECTAL | Status: DC | PRN

## 2016-07-12 MED ORDER — ALUM & MAG HYDROXIDE-SIMETH 200-200-20 MG/5ML PO SUSP: 30.0000 mL | ORAL | Status: DC | PRN

## 2016-07-12 MED ORDER — ALBUTEROL SULFATE (2.5 MG/3ML) 0.083% IN NEBU: 2.5000 mg | INHALATION_SOLUTION | Freq: Four times a day (QID) | RESPIRATORY_TRACT | Status: DC | PRN

## 2016-07-12 MED ORDER — MENTHOL 3 MG MT LOZG
1.0000 | LOZENGE | OROMUCOSAL | Status: DC | PRN
  Filled 2016-07-12: qty 9

## 2016-07-12 MED ORDER — BUPIVACAINE HCL (PF) 0.25 % IJ SOLN
INTRAMUSCULAR | Status: DC | PRN
  Administered 2016-07-12: 60 mL

## 2016-07-12 MED ORDER — MOMETASONE FURO-FORMOTEROL FUM 200-5 MCG/ACT IN AERO
2.0000 | INHALATION_SPRAY | Freq: Two times a day (BID) | RESPIRATORY_TRACT | Status: DC
  Administered 2016-07-13 – 2016-07-14 (×2): 2 via RESPIRATORY_TRACT
  Filled 2016-07-12: qty 8.8

## 2016-07-12 MED ORDER — LIDOCAINE HCL (CARDIAC) 20 MG/ML IV SOLN
INTRAVENOUS | Status: DC | PRN
  Administered 2016-07-12: 100 mg via INTRAVENOUS

## 2016-07-12 MED ORDER — DEXAMETHASONE SODIUM PHOSPHATE 4 MG/ML IJ SOLN
INTRAMUSCULAR | Status: DC | PRN
  Administered 2016-07-12: 6 mg via INTRAVENOUS

## 2016-07-12 MED ORDER — FENTANYL CITRATE (PF) 100 MCG/2ML IJ SOLN
INTRAMUSCULAR | Status: DC | PRN
  Administered 2016-07-12: 250 ug via INTRAVENOUS
  Administered 2016-07-12: 50 ug via INTRAVENOUS
  Administered 2016-07-12 (×2): 25 ug via INTRAVENOUS
  Administered 2016-07-12: 50 ug via INTRAVENOUS

## 2016-07-12 MED ORDER — TRAMADOL HCL 50 MG PO TABS
50.0000 mg | ORAL_TABLET | ORAL | Status: DC | PRN
  Administered 2016-07-12: 100 mg via ORAL
  Administered 2016-07-13 – 2016-07-14 (×5): 50 mg via ORAL
  Filled 2016-07-12 (×4): qty 1
  Filled 2016-07-12: qty 2
  Filled 2016-07-12: qty 1

## 2016-07-12 MED ORDER — CHLORHEXIDINE GLUCONATE 4 % EX LIQD: 60.0000 mL | Freq: Once | CUTANEOUS | Status: DC

## 2016-07-12 MED ORDER — CITALOPRAM HYDROBROMIDE 20 MG PO TABS
20.0000 mg | ORAL_TABLET | Freq: Every day | ORAL | Status: DC
  Administered 2016-07-12 – 2016-07-14 (×3): 20 mg via ORAL
  Filled 2016-07-12 (×3): qty 1

## 2016-07-12 MED ORDER — ONDANSETRON HCL 4 MG/2ML IJ SOLN
INTRAMUSCULAR | Status: AC
  Filled 2016-07-12: qty 2

## 2016-07-12 MED ORDER — ONDANSETRON HCL 4 MG PO TABS: 4.0000 mg | ORAL_TABLET | Freq: Four times a day (QID) | ORAL | Status: DC | PRN

## 2016-07-12 MED ORDER — DIPHENHYDRAMINE HCL 12.5 MG/5ML PO ELIX: 12.5000 mg | ORAL_SOLUTION | ORAL | Status: DC | PRN

## 2016-07-12 MED ORDER — PSEUDOEPHEDRINE HCL 30 MG PO TABS
30.0000 mg | ORAL_TABLET | ORAL | Status: DC | PRN
  Filled 2016-07-12: qty 1

## 2016-07-12 MED ORDER — CHLORHEXIDINE GLUCONATE CLOTH 2 % EX PADS
6.0000 | MEDICATED_PAD | Freq: Every day | CUTANEOUS | Status: DC
  Administered 2016-07-12 – 2016-07-13 (×2): 6 via TOPICAL

## 2016-07-12 MED ORDER — ACETAMINOPHEN 10 MG/ML IV SOLN
INTRAVENOUS | Status: AC
  Filled 2016-07-12: qty 100

## 2016-07-12 MED ORDER — METOCLOPRAMIDE HCL 10 MG PO TABS
10.0000 mg | ORAL_TABLET | Freq: Three times a day (TID) | ORAL | Status: DC
  Administered 2016-07-12 – 2016-07-14 (×4): 10 mg via ORAL
  Filled 2016-07-12 (×6): qty 1

## 2016-07-12 MED ORDER — PROPOFOL 500 MG/50ML IV EMUL
INTRAVENOUS | Status: AC
  Filled 2016-07-12: qty 50

## 2016-07-12 MED ORDER — VITAMIN D 1000 UNITS PO TABS
2000.0000 [IU] | ORAL_TABLET | Freq: Every day | ORAL | Status: DC
  Administered 2016-07-13 – 2016-07-14 (×2): 2000 [IU] via ORAL
  Filled 2016-07-12: qty 1
  Filled 2016-07-12 (×2): qty 2

## 2016-07-12 MED ORDER — PROPOFOL 10 MG/ML IV BOLUS
INTRAVENOUS | Status: DC | PRN
  Administered 2016-07-12: 100 mg via INTRAVENOUS
  Administered 2016-07-12: 20 mg via INTRAVENOUS

## 2016-07-12 MED ORDER — ACETAMINOPHEN 325 MG PO TABS: 650.0000 mg | ORAL_TABLET | Freq: Four times a day (QID) | ORAL | Status: DC | PRN

## 2016-07-12 MED ORDER — SUGAMMADEX SODIUM 200 MG/2ML IV SOLN
INTRAVENOUS | Status: AC
  Filled 2016-07-12: qty 2

## 2016-07-12 MED ORDER — TRANEXAMIC ACID 1000 MG/10ML IV SOLN
1000.0000 mg | Freq: Once | INTRAVENOUS | Status: AC
  Administered 2016-07-12: 1000 mg via INTRAVENOUS
  Filled 2016-07-12: qty 10

## 2016-07-12 MED ORDER — ROCURONIUM BROMIDE 50 MG/5ML IV SOLN
INTRAVENOUS | Status: AC
  Filled 2016-07-12: qty 1

## 2016-07-12 MED ORDER — DEXAMETHASONE SODIUM PHOSPHATE 10 MG/ML IJ SOLN
INTRAMUSCULAR | Status: AC
  Filled 2016-07-12: qty 1

## 2016-07-12 MED ORDER — FENTANYL CITRATE (PF) 100 MCG/2ML IJ SOLN
25.0000 ug | INTRAMUSCULAR | Status: DC | PRN
  Administered 2016-07-12 (×4): 25 ug via INTRAVENOUS

## 2016-07-12 MED ORDER — SUGAMMADEX SODIUM 200 MG/2ML IV SOLN
INTRAVENOUS | Status: DC | PRN
  Administered 2016-07-12: 190 mg via INTRAVENOUS

## 2016-07-12 MED ORDER — CEFAZOLIN SODIUM-DEXTROSE 2-4 GM/100ML-% IV SOLN
2.0000 g | Freq: Four times a day (QID) | INTRAVENOUS | Status: AC
  Administered 2016-07-12 – 2016-07-13 (×4): 2 g via INTRAVENOUS
  Filled 2016-07-12 (×5): qty 100

## 2016-07-12 MED ORDER — ACETAMINOPHEN 650 MG RE SUPP
650.0000 mg | Freq: Four times a day (QID) | RECTAL | Status: DC | PRN
Start: 2016-07-12 — End: 2016-07-14

## 2016-07-12 MED ORDER — FLUTICASONE PROPIONATE 50 MCG/ACT NA SUSP
2.0000 | Freq: Every day | NASAL | Status: DC | PRN
  Filled 2016-07-12: qty 16

## 2016-07-12 MED ORDER — FENTANYL CITRATE (PF) 100 MCG/2ML IJ SOLN
INTRAMUSCULAR | Status: AC
  Administered 2016-07-12: 25 ug via INTRAVENOUS
  Filled 2016-07-12: qty 2

## 2016-07-12 MED ORDER — ONDANSETRON HCL 4 MG/2ML IJ SOLN: 4.0000 mg | Freq: Four times a day (QID) | INTRAMUSCULAR | Status: DC | PRN

## 2016-07-12 MED ORDER — MONTELUKAST SODIUM 10 MG PO TABS
10.0000 mg | ORAL_TABLET | Freq: Every day | ORAL | Status: DC
  Administered 2016-07-12: 10 mg via ORAL
  Filled 2016-07-12 (×2): qty 1

## 2016-07-12 MED ORDER — TRANEXAMIC ACID 1000 MG/10ML IV SOLN
1000.0000 mg | INTRAVENOUS | Status: DC
  Filled 2016-07-12: qty 10

## 2016-07-12 MED ORDER — PHENOL 1.4 % MT LIQD
1.0000 | OROMUCOSAL | Status: DC | PRN
  Filled 2016-07-12: qty 177

## 2016-07-12 MED ORDER — PROPOFOL 10 MG/ML IV BOLUS
INTRAVENOUS | Status: AC
  Filled 2016-07-12: qty 20

## 2016-07-12 MED ORDER — MUPIROCIN 2 % EX OINT
1.0000 "application " | TOPICAL_OINTMENT | Freq: Two times a day (BID) | CUTANEOUS | Status: DC
  Administered 2016-07-12 – 2016-07-14 (×3): 1 via NASAL
  Filled 2016-07-12: qty 22

## 2016-07-12 MED ORDER — BISACODYL 10 MG RE SUPP: 10.0000 mg | Freq: Every day | RECTAL | Status: DC | PRN

## 2016-07-12 MED ORDER — MIDAZOLAM HCL 5 MG/5ML IJ SOLN
INTRAMUSCULAR | Status: DC | PRN
  Administered 2016-07-12: 2 mg via INTRAVENOUS

## 2016-07-12 MED ORDER — MELOXICAM 7.5 MG PO TABS
7.5000 mg | ORAL_TABLET | Freq: Two times a day (BID) | ORAL | Status: DC
  Administered 2016-07-12 – 2016-07-14 (×3): 7.5 mg via ORAL
  Filled 2016-07-12 (×5): qty 1

## 2016-07-12 MED ORDER — CEFAZOLIN SODIUM-DEXTROSE 2-3 GM-% IV SOLR
INTRAVENOUS | Status: DC | PRN
  Administered 2016-07-12: 2 g via INTRAVENOUS

## 2016-07-12 MED ORDER — ACETAMINOPHEN 10 MG/ML IV SOLN
1000.0000 mg | Freq: Four times a day (QID) | INTRAVENOUS | Status: AC
  Administered 2016-07-12 – 2016-07-13 (×3): 1000 mg via INTRAVENOUS
  Filled 2016-07-12 (×4): qty 100

## 2016-07-12 MED ORDER — TRANEXAMIC ACID 1000 MG/10ML IV SOLN
INTRAVENOUS | Status: DC | PRN
  Administered 2016-07-12: 1000 mg via INTRAVENOUS

## 2016-07-12 SURGICAL SUPPLY — 69 items
BATTERY INSTRU NAVIGATION (MISCELLANEOUS) ×12 IMPLANT
BLADE SAW 1 (BLADE) ×3 IMPLANT
BLADE SAW 1/2 (BLADE) ×3 IMPLANT
BLADE SAW 70X12.5 (BLADE) IMPLANT
BONE CEMENT GENTAMICIN (Cement) ×6 IMPLANT
CANISTER SUCT 1200ML W/VALVE (MISCELLANEOUS) ×3 IMPLANT
CANISTER SUCT 3000ML PPV (MISCELLANEOUS) ×6 IMPLANT
CAP KNEE TOTAL 3 SIGMA ×3 IMPLANT
CATH TRAY METER 16FR LF (MISCELLANEOUS) ×3 IMPLANT
CEMENT BONE GENTAMICIN 40 (Cement) ×2 IMPLANT
COOLER POLAR GLACIER W/PUMP (MISCELLANEOUS) ×3 IMPLANT
CUFF TOURN 24 STER (MISCELLANEOUS) IMPLANT
CUFF TOURN 30 STER DUAL PORT (MISCELLANEOUS) ×3 IMPLANT
DRAPE SHEET LG 3/4 BI-LAMINATE (DRAPES) ×3 IMPLANT
DRESSING ALLEVYN LIFE SACRUM (GAUZE/BANDAGES/DRESSINGS) ×3 IMPLANT
DRSG DERMACEA 8X12 NADH (GAUZE/BANDAGES/DRESSINGS) ×3 IMPLANT
DRSG OPSITE POSTOP 4X14 (GAUZE/BANDAGES/DRESSINGS) ×3 IMPLANT
DRSG TEGADERM 4X4.75 (GAUZE/BANDAGES/DRESSINGS) ×3 IMPLANT
DURAPREP 26ML APPLICATOR (WOUND CARE) ×6 IMPLANT
ELECT CAUTERY BLADE 6.4 (BLADE) ×3 IMPLANT
ELECT REM PT RETURN 9FT ADLT (ELECTROSURGICAL) ×3
ELECTRODE REM PT RTRN 9FT ADLT (ELECTROSURGICAL) ×1 IMPLANT
EX-PIN ORTHOLOCK NAV 4X150 (PIN) ×6 IMPLANT
GLOVE BIO SURGEON STRL SZ7 (GLOVE) ×3 IMPLANT
GLOVE BIOGEL M STRL SZ7.5 (GLOVE) ×12 IMPLANT
GLOVE BIOGEL PI IND STRL 7.0 (GLOVE) ×2 IMPLANT
GLOVE BIOGEL PI IND STRL 7.5 (GLOVE) ×1 IMPLANT
GLOVE BIOGEL PI IND STRL 9 (GLOVE) ×2 IMPLANT
GLOVE BIOGEL PI INDICATOR 7.0 (GLOVE) ×4
GLOVE BIOGEL PI INDICATOR 7.5 (GLOVE) ×2
GLOVE BIOGEL PI INDICATOR 9 (GLOVE) ×4
GLOVE INDICATOR 8.0 STRL GRN (GLOVE) ×6 IMPLANT
GLOVE SURG SYN 9.0  PF PI (GLOVE) ×4
GLOVE SURG SYN 9.0 PF PI (GLOVE) ×2 IMPLANT
GOWN STRL REUS W/ TWL LRG LVL3 (GOWN DISPOSABLE) ×3 IMPLANT
GOWN STRL REUS W/TWL 2XL LVL3 (GOWN DISPOSABLE) ×3 IMPLANT
GOWN STRL REUS W/TWL LRG LVL3 (GOWN DISPOSABLE) ×6
HEMOVAC 400CC 10FR (MISCELLANEOUS) ×3 IMPLANT
HOLDER FOLEY CATH W/STRAP (MISCELLANEOUS) ×3 IMPLANT
HOOD PEEL AWAY FLYTE STAYCOOL (MISCELLANEOUS) ×6 IMPLANT
KIT RM TURNOVER STRD PROC AR (KITS) ×3 IMPLANT
KNIFE SCULPS 14X20 (INSTRUMENTS) ×3 IMPLANT
LABEL OR SOLS (LABEL) ×3 IMPLANT
NDL SAFETY 18GX1.5 (NEEDLE) ×3 IMPLANT
NEEDLE SPNL 20GX3.5 QUINCKE YW (NEEDLE) ×3 IMPLANT
NS IRRIG 500ML POUR BTL (IV SOLUTION) ×3 IMPLANT
PACK TOTAL KNEE (MISCELLANEOUS) ×3 IMPLANT
PAD WRAPON POLAR KNEE (MISCELLANEOUS) ×1 IMPLANT
PIN DRILL QUICK PACK ×3 IMPLANT
PIN FIXATION 1/8DIA X 3INL (PIN) ×3 IMPLANT
PULSAVAC PLUS IRRIG FAN TIP (DISPOSABLE) ×3
SOL .9 NS 3000ML IRR  AL (IV SOLUTION) ×2
SOL .9 NS 3000ML IRR UROMATIC (IV SOLUTION) ×1 IMPLANT
SOL PREP PVP 2OZ (MISCELLANEOUS) ×3
SOLUTION PREP PVP 2OZ (MISCELLANEOUS) ×1 IMPLANT
SPONGE DRAIN TRACH 4X4 STRL 2S (GAUZE/BANDAGES/DRESSINGS) ×3 IMPLANT
STAPLER SKIN PROX 35W (STAPLE) ×3 IMPLANT
STRAP TIBIA SHORT (MISCELLANEOUS) ×3 IMPLANT
SUCTION FRAZIER HANDLE 10FR (MISCELLANEOUS) ×2
SUCTION TUBE FRAZIER 10FR DISP (MISCELLANEOUS) ×1 IMPLANT
SUT VIC AB 0 CT1 36 (SUTURE) ×3 IMPLANT
SUT VIC AB 1 CT1 36 (SUTURE) ×6 IMPLANT
SUT VIC AB 2-0 CT2 27 (SUTURE) ×3 IMPLANT
SYR 20CC LL (SYRINGE) ×3 IMPLANT
SYR 30ML LL (SYRINGE) ×6 IMPLANT
TIP FAN IRRIG PULSAVAC PLUS (DISPOSABLE) ×1 IMPLANT
TOWEL OR 17X26 4PK STRL BLUE (TOWEL DISPOSABLE) IMPLANT
TOWER CARTRIDGE SMART MIX (DISPOSABLE) ×3 IMPLANT
WRAPON POLAR PAD KNEE (MISCELLANEOUS) ×3

## 2016-07-12 NOTE — Anesthesia Post-op Follow-up Note (Cosign Needed)
Anesthesia QCDR form completed.        

## 2016-07-12 NOTE — Anesthesia Procedure Notes (Signed)
Spinal  Patient location during procedure: OR Start time: 07/12/2016 12:00 PM End time: 07/12/2016 12:15 PM Staffing Anesthesiologist: Yves Dill Performed: anesthesiologist  Preanesthetic Checklist Completed: patient identified, site marked, surgical consent, pre-op evaluation, timeout performed, IV checked, risks and benefits discussed and monitors and equipment checked Spinal Block Patient position: sitting Prep: Betadine and site prepped and draped Patient monitoring: heart rate, cardiac monitor, continuous pulse ox and blood pressure Location: L3-4 Additional Notes Time out called.  Patient placed in sitting position.  Back prepped and draped in sterile fashion.  Adequate LA provided by skin wheal with 1% Lidocaine plain in the L3-L4 and L4-L5 interspaces.  Difficult to palpate spinous processes in both locations with lumbar lam scar down below.  Attempt at both locations was unsuccessful with no paresthesias or blood or CSF.  Patient tolerated the procedure well.   Decided on GOT

## 2016-07-12 NOTE — Op Note (Signed)
OPERATIVE NOTE  DATE OF SURGERY:  07/12/2016  PATIENT NAME:  Courtney Wilcox   DOB: 05/16/1945  MRN: [id number]  PRE-OPERATIVE DIAGNOSIS: Degenerative arthrosis of the left knee, primary  POST-OPERATIVE DIAGNOSIS:  Same  PROCEDURE:  Left total knee arthroplasty using computer-assisted navigation  SURGEON:  Jena Gauss. M.D.  ASSISTANT:  Van Clines, PA (present and scrubbed throughout the case, critical for assistance with exposure, retraction, instrumentation, and closure)  ANESTHESIA: general  ESTIMATED BLOOD LOSS: 50 mL  FLUIDS REPLACED: 1700 mL of crystalloid  TOURNIQUET TIME: 92 minutes  DRAINS: 2 medium Hemovac drains  SOFT TISSUE RELEASES: Anterior cruciate ligament, posterior cruciate ligament, deep medial collateral ligament, patellofemoral ligament  IMPLANTS UTILIZED: DePuy PFC Sigma size 3 posterior stabilized femoral component (cemented), size 3 MBT tibial component (cemented), 35 mm 3 peg oval dome patella (cemented), and a 10 mm stabilized rotating platform polyethylene insert.  INDICATIONS FOR SURGERY: Courtney Wilcox is a 71 y.o. year old female with a long history of progressive knee pain. X-rays demonstrated severe degenerative changes in tricompartmental fashion. The patient had not seen any significant improvement despite conservative nonsurgical intervention. After discussion of the risks and benefits of surgical intervention, the patient expressed understanding of the risks benefits and agree with plans for total knee arthroplasty.   The risks, benefits, and alternatives were discussed at length including but not limited to the risks of infection, bleeding, nerve injury, stiffness, blood clots, the need for revision surgery, cardiopulmonary complications, among others, and they were willing to proceed.  PROCEDURE IN DETAIL: The patient was brought into the operating room and, after adequate general anesthesia was achieved, a tourniquet was placed on the  patient's upper thigh. The patient's knee and leg were cleaned and prepped with alcohol and DuraPrep and draped in the usual sterile fashion. A "timeout" was performed as per usual protocol. The lower extremity was exsanguinated using an Esmarch, and the tourniquet was inflated to 300 mmHg. An anterior longitudinal incision was made followed by a standard mid vastus approach. The deep fibers of the medial collateral ligament were elevated in a subperiosteal fashion off of the medial flare of the tibia so as to maintain a continuous soft tissue sleeve. The patella was subluxed laterally and the patellofemoral ligament was incised. Inspection of the knee demonstrated severe degenerative changes with full-thickness loss of articular cartilage. Osteophytes were debrided using a rongeur. Anterior and posterior cruciate ligaments were excised. Two 4.0 mm Schanz pins were inserted in the femur and into the tibia for attachment of the array of trackers used for computer-assisted navigation. Hip center was identified using a circumduction technique. Distal landmarks were mapped using the computer. The distal femur and proximal tibia were mapped using the computer. The distal femoral cutting guide was positioned using computer-assisted navigation so as to achieve a 5 distal valgus cut. The femur was sized and it was felt that a size 3 femoral component was appropriate. A size 3 femoral cutting guide was positioned and the anterior cut was performed and verified using the computer. This was followed by completion of the posterior and chamfer cuts. Femoral cutting guide for the central box was then positioned in the center box cut was performed.  Attention was then directed to the proximal tibia. Medial and lateral menisci were excised. The extramedullary tibial cutting guide was positioned using computer-assisted navigation so as to achieve a 0 varus-valgus alignment and 0 posterior slope. The cut was performed and  verified using the computer. The  proximal tibia was sized and it was felt that a size 3 tibial tray was appropriate. Tibial and femoral trials were inserted followed by insertion of a 10 mm polyethylene insert. The knee was felt to be tight both in flexion and in extension and lacks full extension. The trial components were removed and the extra medullary tibial cutting guide was positioned so as to resect an additional 2 mm of bone. This was performed using the oscillating saw. Trial components were reinserted with the 10 mm polyethylene trial. This allowed for excellent mediolateral soft tissue balancing both in flexion and in full extension. Finally, the patella was cut and prepared so as to accommodate a 35 mm 3 peg oval dome patella. A patella trial was placed and the knee was placed through a range of motion with excellent patellar tracking appreciated. The femoral trial was removed after debridement of posterior osteophytes. The central post-hole for the tibial component was reamed followed by insertion of a keel punch. Tibial trials were then removed. Cut surfaces of bone were irrigated with copious amounts of normal saline with antibiotic solution using pulsatile lavage and then suctioned dry. Polymethylmethacrylate cement with gentamicin was prepared in the usual fashion using a vacuum mixer. Cement was applied to the cut surface of the proximal tibia as well as along the undersurface of a size 3 MBT tibial component. Tibial component was positioned and impacted into place. Excess cement was removed using Personal assistant. Cement was then applied to the cut surfaces of the femur as well as along the posterior flanges of the size 3 femoral component. The femoral component was positioned and impacted into place. Excess cement was removed using Personal assistant. A 10 mm polyethylene trial was inserted and the knee was brought into full extension with steady axial compression applied. Finally, cement was applied  to the backside of a 35 mm 3 peg oval dome patella and the patellar component was positioned and patellar clamp applied. Excess cement was removed using Personal assistant. After adequate curing of the cement, the tourniquet was deflated after a total tourniquet time of 92 minutes. Hemostasis was achieved using electrocautery. The knee was irrigated with copious amounts of normal saline with antibiotic solution using pulsatile lavage and then suctioned dry. 20 mL of 1.3% Exparel and 60 mL of 0.25% Marcaine in 40 mL of normal saline was injected along the posterior capsule, medial and lateral gutters, and along the arthrotomy site. A 10 mm stabilized rotating platform polyethylene insert was inserted and the knee was placed through a range of motion with excellent mediolateral soft tissue balancing appreciated and excellent patellar tracking noted. 2 medium drains were placed in the wound bed and brought out through separate stab incisions. The medial parapatellar portion of the incision was reapproximated using interrupted sutures of #1 Vicryl. Subcutaneous tissue was approximated in layers using first #0 Vicryl followed #2-0 Vicryl. The skin was approximated with skin staples. A sterile dressing was applied.  The patient tolerated the procedure well and was transported to the recovery room in stable condition.    James P. Angie Fava., M.D.

## 2016-07-12 NOTE — Progress Notes (Signed)
Admission: Patient alert and oriented. Dressing intact and hemovac. Patient complaining of moderate pain. Patient oriented to room and call bell system. Bone foam in place.   Harvie Heck, RN

## 2016-07-12 NOTE — Anesthesia Preprocedure Evaluation (Addendum)
Anesthesia Evaluation  Patient identified by MRN, date of birth, ID band Patient awake    Reviewed: Allergy & Precautions, NPO status , Patient's Chart, lab work & pertinent test results  Airway Mallampati: III  TM Distance: <3 FB     Dental  (+) Lower Dentures, Upper Dentures   Pulmonary shortness of breath and with exertion, asthma , sleep apnea ,           Cardiovascular negative cardio ROS Normal cardiovascular exam     Neuro/Psych PSYCHIATRIC DISORDERS Anxiety Depression    GI/Hepatic Neg liver ROS, GERD  Medicated,IBS   Endo/Other  negative endocrine ROS  Renal/GU negative Renal ROS  negative genitourinary   Musculoskeletal  (+) Arthritis , Osteoarthritis,    Abdominal (+) + obese,   Peds negative pediatric ROS (+)  Hematology  (+) anemia ,   Anesthesia Other Findings Past Medical History: No date: Allergic rhinitis No date: Anemia No date: Anxiety No date: Arthritis No date: Atrophic vaginitis No date: B12 deficiency No date: Bronchitis, chronic (HCC) No date: Cervical disc disease No date: Depression No date: Foot ulcer (HCC) No date: GERD (gastroesophageal reflux disease) No date: Hammer toe No date: Hyperlipidemia No date: IBS (irritable bowel syndrome) No date: Obesity No date: Obstructive sleep apnea No date: OSA (obstructive sleep apnea) No date: Osteoarthrosis     Comment: Multi site No date: Osteopenia No date: Tonsillitis     Comment: recurrent  Reproductive/Obstetrics                            Anesthesia Physical Anesthesia Plan  ASA: III  Anesthesia Plan: Spinal   Post-op Pain Management:    Induction: Intravenous  PONV Risk Score and Plan:   Airway Management Planned: Nasal Cannula  Additional Equipment:   Intra-op Plan:   Post-operative Plan:   Informed Consent: I have reviewed the patients History and Physical, chart, labs and discussed  the procedure including the risks, benefits and alternatives for the proposed anesthesia with the patient or authorized representative who has indicated his/her understanding and acceptance.   Dental advisory given  Plan Discussed with: CRNA and Surgeon  Anesthesia Plan Comments:         Anesthesia Quick Evaluation

## 2016-07-12 NOTE — Anesthesia Procedure Notes (Signed)
Procedure Name: Intubation Date/Time: 07/12/2016 12:22 PM Performed by: Rosaria Ferries, Dorthie Santini Pre-anesthesia Checklist: Patient identified, Emergency Drugs available, Suction available and Patient being monitored Patient Re-evaluated:Patient Re-evaluated prior to inductionOxygen Delivery Method: Circle system utilized Preoxygenation: Pre-oxygenation with 100% oxygen Intubation Type: IV induction Laryngoscope Size: Mac and 3 Grade View: Grade I Tube size: 7.0 mm Number of attempts: 1 Placement Confirmation: ETT inserted through vocal cords under direct vision,  positive ETCO2 and breath sounds checked- equal and bilateral Secured at: 21 cm Tube secured with: Tape Dental Injury: Teeth and Oropharynx as per pre-operative assessment

## 2016-07-12 NOTE — OR Nursing (Signed)
Dr. Noralyn Pick notified of K+ 3.2 and that patient stopped her plaquinil one week ago.

## 2016-07-12 NOTE — Transfer of Care (Signed)
Immediate Anesthesia Transfer of Care Note  Patient: Courtney Wilcox  Procedure(s) Performed: Procedure(s): COMPUTER ASSISTED TOTAL KNEE ARTHROPLASTY (Left)  Patient Location: PACU  Anesthesia Type:General  Level of Consciousness: sedated  Airway & Oxygen Therapy: Patient Spontanous Breathing and Patient connected to face mask oxygen  Post-op Assessment: Report given to RN and Post -op Vital signs reviewed and stable  Post vital signs: Reviewed and stable  Last Vitals:  Vitals:   07/12/16 1540 07/12/16 1545  BP:  138/64  Pulse: 66 (!) 58  Resp: 18 17  Temp: 36.2 C     Last Pain:  Vitals:   07/12/16 1107  TempSrc: Oral  PainSc: 5       Patients Stated Pain Goal: 2 (07/12/16 1107)  Complications: No apparent anesthesia complications

## 2016-07-12 NOTE — H&P (Signed)
The patient has been re-examined, and the chart reviewed, and there have been no interval changes to the documented history and physical.    The risks, benefits, and alternatives have been discussed at length. The patient expressed understanding of the risks benefits and agreed with plans for surgical intervention.  Steve Gregg P. Kelvyn Schunk, Jr. M.D.    

## 2016-07-12 NOTE — [place] FL2 (Signed)
[place] MEDICAID FL2 LEVEL OF CARE SCREENING TOOL     IDENTIFICATION  Patient Name: Courtney Wilcox Birthdate: [birth year] Sex: female Admission Date (Current Location): 07/12/2016  [place] and [place] Number:  Chiropodist and Address:  [facility], [address], [place], [place] [address]      Provider Number: [id number]  Attending Physician Name and Address:  Donato Heinz, MD  Relative Name and Phone Number:       Current Level of Care: Hospital Recommended Level of Care: Skilled Nursing Facility Prior Approval Number:    Date Approved/Denied:   PASRR Number:  [id number])  Discharge Plan: SNF    Current Diagnoses: Patient Active Problem List   Diagnosis Date Noted  . S/P total knee arthroplasty 07/12/2016  . Asthma, mild 02/19/2013  . Chest pain 02/19/2013  . Rheumatoid arthritis (HCC) 10/30/2012  . Shortness of breath 10/29/2012  . Spinal stenosis in cervical region 10/29/2012    Orientation RESPIRATION BLADDER Height & Weight     Self, Time, Situation, Place  Normal Incontinent Weight: 194 lb 0.1 oz (88 kg) Height:  5\' 1"  (154.9 cm)  BEHAVIORAL SYMPTOMS/MOOD NEUROLOGICAL BOWEL NUTRITION STATUS   (none)  (none) Continent Diet (Diet: clear liquid to be advanced )  AMBULATORY STATUS COMMUNICATION OF NEEDS Skin   Extensive Assist Verbally Surgical wounds (Incision: Left Knee)                       Personal Care Assistance Level of Assistance  Bathing, Feeding, Dressing Bathing Assistance: Limited assistance Feeding assistance: Independent Dressing Assistance: Limited assistance     Functional Limitations Info  Sight, Hearing, Speech Sight Info: Adequate Hearing Info: Adequate Speech Info: Adequate    SPECIAL CARE FACTORS FREQUENCY  PT (By licensed PT), OT (By licensed OT)     PT Frequency:  (5) OT Frequency:  (5)            Contractures      Additional Factors Info  Code Status Code Status  Info:  (Full Code. )             Current Medications (07/12/2016):  This is the current hospital active medication list Current Facility-Administered Medications  Medication Dose Route Frequency Provider Last Rate Last Dose  . 0.9 %  sodium chloride infusion   Intravenous Continuous Lyndell Allaire, 07/14/2016, MD 100 mL/hr at 07/12/16 1700    . acetaminophen (OFIRMEV) IV 1,000 mg  1,000 mg Intravenous Q6H Brenisha Tsui, 07/14/16, MD   Stopped at 07/12/16 1901  . acetaminophen (TYLENOL) tablet 650 mg  650 mg Oral Q6H PRN Oreatha Fabry, 07/14/16, MD       Or  . acetaminophen (TYLENOL) suppository 650 mg  650 mg Rectal Q6H PRN Kerron Sedano, Illene Labrador, MD      . albuterol (PROVENTIL) (2.5 MG/3ML) 0.083% nebulizer solution 2.5 mg  2.5 mg Nebulization Q6H PRN Illene Labrador, RPH      . ALPRAZolam (XANAX) tablet 0.5 mg  0.5 mg Oral QHS PRN Summer Mccolgan, Cindi Carbon, MD      . alum & mag hydroxide-simeth (MAALOX/MYLANTA) 200-200-20 MG/5ML suspension 30 mL  30 mL Oral Q4H PRN Tempie Gibeault, 07-25-2000, MD      . bisacodyl (DULCOLAX) suppository 10 mg  10 mg Rectal Daily PRN Alaynah Schutter, Illene Labrador, MD      . ceFAZolin (ANCEF) 2-4 GM/100ML-% IVPB           . ceFAZolin (ANCEF) IVPB 2g/100  mL premix  2 g Intravenous Q6H Mairead Schwarzkopf, Illene Labrador, MD 200 mL/hr at 07/12/16 2108 2 g at 07/12/16 2108  . Chlorhexidine Gluconate Cloth 2 % PADS 6 each  6 each Topical Daily Neenah Canter, Illene Labrador, MD   6 each at 07/12/16 1715  . citalopram (CELEXA) tablet 20 mg  20 mg Oral Daily Harriet Bollen, Illene Labrador, MD   20 mg at 07/12/16 1808  . diphenhydrAMINE (BENADRYL) 12.5 MG/5ML elixir 12.5-25 mg  12.5-25 mg Oral Q4H PRN Donato Heinz, MD      . Melene Muller ON 07/13/2016] enoxaparin (LOVENOX) injection 30 mg  30 mg Subcutaneous Q12H Fortune Brannigan, Illene Labrador, MD      . ferrous sulfate tablet 325 mg  325 mg Oral BID WC Donato Heinz, MD   325 mg at 07/12/16 2110  . fluticasone (FLONASE) 50 MCG/ACT nasal spray 2 spray  2 spray Each Nare Daily PRN Deitrich Steve, Illene Labrador, MD      . magnesium hydroxide (MILK OF MAGNESIA)  suspension 30 mL  30 mL Oral Daily PRN Vincent Streater, Illene Labrador, MD      . meloxicam (MOBIC) tablet 7.5 mg  7.5 mg Oral BID Donato Heinz, MD   7.5 mg at 07/12/16 2110  . menthol-cetylpyridinium (CEPACOL) lozenge 3 mg  1 lozenge Oral PRN Gwendlyon Zumbro, Illene Labrador, MD       Or  . phenol (CHLORASEPTIC) mouth spray 1 spray  1 spray Mouth/Throat PRN Jeydan Barner, Illene Labrador, MD      . metoCLOPramide (REGLAN) tablet 10 mg  10 mg Oral TID AC & HS Nupur Hohman, Illene Labrador, MD   10 mg at 07/12/16 2109  . mometasone-formoterol (DULERA) 200-5 MCG/ACT inhaler 2 puff  2 puff Inhalation BID Keanu Lesniak, Illene Labrador, MD      . montelukast (SINGULAIR) tablet 10 mg  10 mg Oral QHS Mikeila Burgen, Illene Labrador, MD   10 mg at 07/12/16 2109  . morphine 2 MG/ML injection 2 mg  2 mg Intravenous Q2H PRN Leilyn Frayre, Illene Labrador, MD   2 mg at 07/12/16 1926  . mupirocin ointment (BACTROBAN) 2 % 1 application  1 application Nasal BID Naraya Stoneberg, Illene Labrador, MD   1 application at 07/12/16 2108  . ondansetron (ZOFRAN) tablet 4 mg  4 mg Oral Q6H PRN Kenadi Miltner, Illene Labrador, MD       Or  . ondansetron (ZOFRAN) injection 4 mg  4 mg Intravenous Q6H PRN Dawit Tankard, Illene Labrador, MD      . pantoprazole (PROTONIX) EC tablet 40 mg  40 mg Oral BID Donato Heinz, MD   40 mg at 07/12/16 2109  . pseudoephedrine (SUDAFED) tablet 30 mg  30 mg Oral Q4H PRN Herbert Aguinaldo, Illene Labrador, MD      . senna-docusate (Senokot-S) tablet 1 tablet  1 tablet Oral BID Sion Reinders, Illene Labrador, MD      . sodium phosphate (FLEET) 7-19 GM/118ML enema 1 enema  1 enema Rectal Once PRN Galen Russman, Illene Labrador, MD      . tapentadol (NUCYNTA) tablet 50-100 mg  50-100 mg Oral Q4H PRN Donato Heinz, MD   50 mg at 07/12/16 2109  . traMADol (ULTRAM) tablet 50-100 mg  50-100 mg Oral Q4H PRN Donato Heinz, MD   100 mg at 07/12/16 1809  . [START ON 07/13/2016] Vitamin D 2,000 Units  2,000 Units Oral Daily Alexa Blish, Illene Labrador, MD         Discharge Medications: Please see discharge summary for a list of discharge medications.  Relevant  Imaging Results:  Relevant Lab  Results:   Additional Information  (SSN: [id number])  Sample, Darleen Crocker, LCSW

## 2016-07-13 ENCOUNTER — Encounter: Payer: Self-pay | Admitting: Orthopedic Surgery

## 2016-07-13 LAB — CBC
HCT: 31.5 % — ABNORMAL LOW (ref 35.0–47.0)
Hemoglobin: 10.8 g/dL — ABNORMAL LOW (ref 12.0–16.0)
MCH: 32.2 pg (ref 26.0–34.0)
MCHC: 34.3 g/dL (ref 32.0–36.0)
MCV: 93.9 fL (ref 80.0–100.0)
PLATELETS: 148 10*3/uL — AB (ref 150–440)
RBC: 3.35 MIL/uL — AB (ref 3.80–5.20)
RDW: 14.2 % (ref 11.5–14.5)
WBC: 7 10*3/uL (ref 3.6–11.0)

## 2016-07-13 LAB — BASIC METABOLIC PANEL
Anion gap: 3 — ABNORMAL LOW (ref 5–15)
BUN: 11 mg/dL (ref 6–20)
CALCIUM: 8.7 mg/dL — AB (ref 8.9–10.3)
CO2: 31 mmol/L (ref 22–32)
CREATININE: 0.99 mg/dL (ref 0.44–1.00)
Chloride: 108 mmol/L (ref 101–111)
GFR calc Af Amer: >60 mL/min (ref >60)
GFR, EST NON AFRICAN AMERICAN: 57 mL/min — AB (ref >60)
Glucose, Bld: 112 mg/dL — ABNORMAL HIGH (ref 65–99)
POTASSIUM: 4.1 mmol/L (ref 3.5–5.1)
SODIUM: 142 mmol/L (ref 135–145)

## 2016-07-13 NOTE — Progress Notes (Signed)
Subjective: 1 Day Post-Op Procedure(s) (LRB): COMPUTER ASSISTED TOTAL KNEE ARTHROPLASTY (Left) Patient reports pain as mild.   Patient is well, and has had no acute complaints or problems We will start therapy today. Patient set up that side. On the night but did not dangle leg. Plan is to go Home after hospital stay. no nausea and no vomiting Patient denies any chest pains or shortness of breath. Patient is placed on Nucynta secondary to allergies to oxycodone and hydrocodone.  Objective: Vital signs in last 24 hours: Temp:  [97.1 F (36.2 C)-98.6 F (37 C)] 98.2 F (36.8 C) (06/28 0537) Pulse Rate:  [54-82] 54 (06/28 0537) Resp:  [14-18] 17 (06/28 0537) BP: (128-158)/(48-84) 128/55 (06/28 0537) SpO2:  [97 %-100 %] 99 % (06/28 0537) Weight:  [88 kg (194 lb 0.1 oz)] 88 kg (194 lb 0.1 oz) (06/27 1750) Heels are non tender and elevated off the bed using rolled towels Lung sounds clear bilaterally. Bowel sounds active. Bone foam in place  Intake/Output from previous day: 06/27 0701 - 06/28 0700 In: 4408.3 [P.O.:760; I.V.:3038.3; IV Piggyback:610] Out: 2450 [Urine:2350; Drains:50; Blood:50] Intake/Output this shift: No intake/output data recorded.   Recent Labs  07/12/16 1128 07/13/16 0538  HGB 10.9* 10.8*    Recent Labs  07/12/16 1128 07/13/16 0538  WBC  --  7.0  RBC  --  3.35*  HCT 32.0* 31.5*  PLT  --  148*    Recent Labs  07/12/16 1128 07/13/16 0538  NA 142 142  K 3.2* 4.1  CL  --  108  CO2  --  31  BUN  --  11  CREATININE  --  0.99  GLUCOSE 89 112*  CALCIUM  --  8.7*   No results for input(s): LABPT, INR in the last 72 hours.  EXAM General - Patient is Alert, Appropriate and Oriented Extremity - Neurologically intact Neurovascular intact Sensation intact distally Intact pulses distally Dorsiflexion/Plantar flexion intact Compartment soft Dressing - dressing C/D/I Motor Function - intact, moving foot and toes well on exam. Patient able  to do straight leg raise on her own.  Past Medical History:  Diagnosis Date  . Allergic rhinitis   . Anemia   . Anxiety   . Arthritis   . Atrophic vaginitis   . B12 deficiency   . Bronchitis, chronic (HCC)   . Cervical disc disease   . Depression   . Foot ulcer (HCC)   . GERD (gastroesophageal reflux disease)   . Hammer toe   . Hyperlipidemia   . IBS (irritable bowel syndrome)   . Obesity   . Obstructive sleep apnea   . OSA (obstructive sleep apnea)   . Osteoarthrosis    Multi site  . Osteopenia   . Tonsillitis    recurrent    Assessment/Plan: 1 Day Post-Op Procedure(s) (LRB): COMPUTER ASSISTED TOTAL KNEE ARTHROPLASTY (Left) Active Problems:   S/P total knee arthroplasty  Estimated body mass index is 36.66 kg/m as calculated from the following:   Height as of this encounter: 5\' 1"  (1.549 m).   Weight as of this encounter: 88 kg (194 lb 0.1 oz). Advance diet Up with therapy D/C IV fluids Plan for discharge tomorrow Discharge home with home health  Labs: Were reviewed and acceptable DVT Prophylaxis - Lovenox, Foot Pumps and TED hose Weight-Bearing as tolerated to left leg D/C O2 and Pulse OX and try on Room Air Begin working on bowel movement Labs tomorrow morning  Moranda Billiot R. PA [facility] 07/13/2016, 7:09 AM

## 2016-07-13 NOTE — Discharge Instructions (Signed)

## 2016-07-13 NOTE — Progress Notes (Signed)
Clinical Social Worker (CSW) received SNF consult. PT is recommending home health. RN case manager aware of above. Please reconsult if future social work needs arise. CSW signing off.   Sashay Felling, LCSW [phone] 

## 2016-07-13 NOTE — Care Management Note (Signed)
Case Management Note  Patient Details  Name: Courtney Wilcox MRN: [id number] Date of Birth: 1945/04/05  Subjective/Objective:  POD # 1  Left TKA. Met with patient at bedside. She lives at home with her spouse. She has 2 daughters that will be caring for her over the next 1 1/2 weeks as well. Discussed PT recommendations. Offered choice of home health agencies. Referral to [facility] for home health PT. She has a walker and BSC. Pharmacy [facility] [address] [phone]. Call Lovenox  40 mg # 14 no refills. PCP is Dr. Carrie Mew.                   Action/Plan: [facility] for PT, Lovenox called in. It is anticipated that patient will discharge tomorrow.    Expected Discharge Date:                  Expected Discharge Plan:  [place]  In-House Referral:     Discharge planning Services  CM Consult  Post Acute Care Choice:  Home Health Choice offered to:  Patient  DME Arranged:    DME Agency:     HH Arranged:  PT [place]:  [facility] (formerly [facility])  Status of Service:  In process, will continue to follow  If discussed at Long Length of Stay Meetings, dates discussed:    Additional Comments:  Jolly Mango, RN 07/13/2016, 2:29 PM

## 2016-07-13 NOTE — Discharge Summary (Signed)
Physician Discharge Summary  Patient ID: Courtney Wilcox MRN: [id number] DOB/AGE: 1945/10/22 71 y.o.  Admit date: 07/12/2016 Discharge date: 07/14/2016  Admission Diagnoses:  PRIMARY OSTEOARTHRITIS OF LEFT KNEE   Discharge Diagnoses: Patient Active Problem List   Diagnosis Date Noted  . S/P total knee arthroplasty 07/12/2016  . Asthma, mild 02/19/2013  . Chest pain 02/19/2013  . Rheumatoid arthritis (HCC) 10/30/2012  . Shortness of breath 10/29/2012  . Spinal stenosis in cervical region 10/29/2012    Past Medical History:  Diagnosis Date  . Allergic rhinitis   . Anemia   . Anxiety   . Arthritis   . Atrophic vaginitis   . B12 deficiency   . Bronchitis, chronic (HCC)   . Cervical disc disease   . Depression   . Foot ulcer (HCC)   . GERD (gastroesophageal reflux disease)   . Hammer toe   . Hyperlipidemia   . IBS (irritable bowel syndrome)   . Obesity   . Obstructive sleep apnea   . OSA (obstructive sleep apnea)   . Osteoarthrosis    Multi site  . Osteopenia   . Tonsillitis    recurrent     Transfusion: No transfusions on this admission   Consultants (if any):   Discharged Condition: Improved  Hospital Course: Courtney Wilcox is an 71 y.o. female who was admitted 07/12/2016 with a diagnosis of degenerative arthrosis left knee and went to the operating room on 07/12/2016 and underwent the above named procedures.    Surgeries:Procedure(s): COMPUTER ASSISTED TOTAL KNEE ARTHROPLASTY on 07/12/2016  PRE-OPERATIVE DIAGNOSIS: Degenerative arthrosis of the left knee, primary  POST-OPERATIVE DIAGNOSIS:  Same  PROCEDURE:  Left total knee arthroplasty using computer-assisted navigation  SURGEON:  Jena Gauss. M.D.  ASSISTANT:  Van Clines, PA (present and scrubbed throughout the case, critical for assistance with exposure, retraction, instrumentation, and closure)  ANESTHESIA: general  ESTIMATED BLOOD LOSS: 50 mL  FLUIDS REPLACED: 1700 mL of  crystalloid  TOURNIQUET TIME: 92 minutes  DRAINS: 2 medium Hemovac drains  SOFT TISSUE RELEASES: Anterior cruciate ligament, posterior cruciate ligament, deep medial collateral ligament, patellofemoral ligament  IMPLANTS UTILIZED: DePuy PFC Sigma size 3 posterior stabilized femoral component (cemented), size 3 MBT tibial component (cemented), 35 mm 3 peg oval dome patella (cemented), and a 10 mm stabilized rotating platform polyethylene insert.  INDICATIONS FOR SURGERY: Courtney Wilcox is a 71 y.o. year old female with a long history of progressive knee pain. X-rays demonstrated severe degenerative changes in tricompartmental fashion. The patient had not seen any significant improvement despite conservative nonsurgical intervention. After discussion of the risks and benefits of surgical intervention, the patient expressed understanding of the risks benefits and agree with plans for total knee arthroplasty.   The risks, benefits, and alternatives were discussed at length including but not limited to the risks of infection, bleeding, nerve injury, stiffness, blood clots, the need for revision surgery, cardiopulmonary complications, among others, and they were willing to proceed.  Patient tolerated the surgery well. No complications .Patient was taken to PACU where she was stabilized and then transferred to the orthopedic floor.  Patient started on Lovenox 30 mg q 12 hrs. Foot pumps applied bilaterally at 80 mm hgb. Heels elevated off bed with rolled towels. No evidence of DVT. Calves non tender. Negative Homan. Physical therapy started on day #1 for gait training and transfer with OT starting on  day #1 for ADL and assisted devices. Patient has done well with therapy. Ambulated greater than 300  feet upon being discharged.  Patient's IV And Foley were discontinued on day #1 with Hemovac being discontinued on day #2. Dressing was changed on day 2 prior to patient being discharged   She was  given perioperative antibiotics:  Anti-infectives    Start     Dose/Rate Route Frequency Ordered Stop   07/12/16 1830  ceFAZolin (ANCEF) IVPB 2g/100 mL premix     2 g 200 mL/hr over 30 Minutes Intravenous Every 6 hours 07/12/16 1659 07/13/16 2059   07/12/16 1008  ceFAZolin (ANCEF) 2-4 GM/100ML-% IVPB    Comments:  Leilani Able: cabinet override      07/12/16 1008 07/12/16 2214   07/12/16 0050  ceFAZolin (ANCEF) IVPB 2g/100 mL premix  Status:  Discontinued     2 g 200 mL/hr over 30 Minutes Intravenous On call to O.R. 07/12/16 0050 07/12/16 1040    .  She was fitted with AV 1 compression foot pump devices, instructed on heel pumps, early ambulation, and fitted with TED stockings bilaterally for DVT prophylaxis.  She benefited maximally from the hospital stay and there were no complications.    Recent vital signs:  Vitals:   07/13/16 0009 07/13/16 0537  BP: (!) 148/60 (!) 128/55  Pulse: 82 (!) 54  Resp: 18 17  Temp: 98.6 F (37 C) 98.2 F (36.8 C)    Recent laboratory studies:  Lab Results  Component Value Date   HGB 10.8 (L) 07/13/2016   HGB 10.9 (L) 07/12/2016   HGB 12.4 06/28/2016   Lab Results  Component Value Date   WBC 7.0 07/13/2016   PLT 148 (L) 07/13/2016   Lab Results  Component Value Date   INR 0.96 06/28/2016   Lab Results  Component Value Date   NA 142 07/13/2016   K 4.1 07/13/2016   CL 108 07/13/2016   CO2 31 07/13/2016   BUN 11 07/13/2016   CREATININE 0.99 07/13/2016   GLUCOSE 112 (H) 07/13/2016    Discharge Medications:   Allergies as of 07/14/2016      Reactions   Lactose Other (See Comments), Diarrhea   Biaxin [clarithromycin] Other (See Comments)   GI upset   Hydrocodone-acetaminophen Itching   Tolerates acetaminophen    Oxycodone Itching   Sulfasalazine Diarrhea      Medication List    TAKE these medications   acetaminophen 500 MG tablet Commonly known as:  TYLENOL Take 1,000 mg by mouth every 8 (eight) hours as needed for  mild pain.   ALPRAZolam 0.5 MG tablet Commonly known as:  XANAX Take 0.5 mg by mouth at bedtime as needed for anxiety or sleep.   budesonide-formoterol 160-4.5 MCG/ACT inhaler Commonly known as:  SYMBICORT Inhale 2 puffs into the lungs 2 (two) times daily. What changed:  when to take this  reasons to take this   citalopram 20 MG tablet Commonly known as:  CELEXA Take 20 mg by mouth daily.   E-Z SPACER inhaler Use as instructed   enoxaparin 30 MG/0.3ML injection Commonly known as:  LOVENOX Inject 0.4 mLs (40 mg total) into the skin daily.   fluticasone 50 MCG/ACT nasal spray Commonly known as:  FLONASE Place 1-2 sprays into the nose daily as needed for allergies.   hydroxychloroquine 200 MG tablet Commonly known as:  PLAQUENIL Take 1 tablet (200 mg total) by mouth daily. Start taking on:  07/21/2016 What changed:  These instructions start on 07/21/2016. If you are unsure what to do until then, ask your doctor or other  care provider.   meloxicam 7.5 MG tablet Commonly known as:  MOBIC Take 7.5 mg by mouth 2 (two) times daily as needed for pain.   montelukast 10 MG tablet Commonly known as:  SINGULAIR Take 10 mg by mouth at bedtime.   omeprazole 20 MG capsule Commonly known as:  PRILOSEC Take 20 mg by mouth daily as needed (acid reflux).   PROAIR HFA 108 (90 Base) MCG/ACT inhaler Generic drug:  albuterol Inhale 2 puffs into the lungs every 6 (six) hours as needed for wheezing.   pseudoephedrine 30 MG tablet Commonly known as:  SUDAFED Take 30 mg by mouth every 4 (four) hours as needed for congestion.   ranitidine 150 MG tablet Commonly known as:  ZANTAC Take 150 mg by mouth every evening.   tapentadol 50 MG tablet Commonly known as:  NUCYNTA Take 1-2 tablets (50-100 mg total) by mouth every 4 (four) hours as needed for moderate pain.   traMADol 50 MG tablet Commonly known as:  ULTRAM Take 50-100 mg by mouth 2 (two) times daily as needed (pain).   Vitamin D  2000 units tablet Take 2,000 Units by mouth daily.            Durable Medical Equipment        Start     Ordered   07/12/16 1659  DME Walker rolling  Once    Question:  Patient needs a walker to treat with the following condition  Answer:  Total knee replacement status   07/12/16 1659   07/12/16 1659  DME Bedside commode  Once    Question:  Patient needs a bedside commode to treat with the following condition  Answer:  Total knee replacement status   07/12/16 1659      Diagnostic Studies: Dg Knee Left Port  Result Date: 07/12/2016 CLINICAL DATA:  Total knee replacement. EXAM: PORTABLE LEFT KNEE - 1-2 VIEW COMPARISON:  None. FINDINGS: The components of the total knee prosthesis appear in excellent position. No fracture. Surgical staples in soft tissue drains in place. IMPRESSION: Satisfactory appearance of the left knee after total knee replacement. Electronically Signed   By: Francene Boyers M.D.   On: 07/12/2016 16:02    Disposition:  Patient will begin taking her plaque [place] 1 week from surgery. Recommend that the patient follow-up here primary care physician in regards to her hypokalemia.   Follow-up Information    Tera Partridge, PA On 07/27/2016.   Specialty:  Physician Assistant Why:  at 1:15pm Contact information: [address] [facility] [place] [address] [phone]        Donato Heinz, MD On 08/24/2016.   Specialty:  Orthopedic Surgery Why:  at 1:30pm Contact information: [address] [facility] [address] [facility] [place] [address] [phone]            Signed: Tera Partridge. 07/13/2016, 7:17 AM

## 2016-07-13 NOTE — Progress Notes (Signed)
Physical Therapy Treatment Patient Details Name: Courtney Wilcox MRN: [id number] DOB: [birth year] Today's Date: 07/13/2016    History of Present Illness Pt is a 71 yo F, admitted to acute care post-op L TKA. Prior to admission pt ModI with all ADL's, using RW for amb; lives with spouse. PMH includes: anxiety, depression, arthritis, OA , anemia, allergic rhinitis, B12 deficiency, atrophic vaginitis, chronic bronchitis, cervical disc disease, foot ulcer, GERD, hammer toe, HLD, IBS, OSA, osteopenia, and tonsillitis. Per pt report, she also has history of R TKA.     PT Comments    Pt is a pleasant 71 y.o. F, admitted to acute care post-op L TKA. Bed mobility deferred as pt in chair upon arrival and preferred to remain in chair following session. Tranfers performed with supervision, due with supervision due to impaired LE strength and safety awareness. Performed total of 190 feet of ambulation with RW, with rest break after 100 ft for stair education. Initially CGA for amb, progressing to supervision due to reports of "stiffness" with initiation of gait. Pt slightly impulsive with ambulation and requires verbal cues for maintaining correct body mechanics and safety awareness, but does well with following cues and performs consistent reciprocal gait pattern. Pt able to ascend 1 step with B handrails x3 times with CGA secondary to impaired LE strength and safety awareness. Pt cont to present with the following deficits: strength, ROM, and safety awareness. Overall, pt responded well to today's treatment with no adverse affects,a dn is progressing towards mobility goals. Pt would benefit from cont skilled PT to address the previously mentioned impairments and promote return to PLOF. Currently recommending HHPT, pending d/c.    Follow Up Recommendations  Home health PT     Equipment Recommendations  None recommended by PT    Recommendations for Other Services       Precautions / Restrictions  Precautions Precautions: Fall Restrictions Weight Bearing Restrictions: Yes Other Position/Activity Restrictions: WBAT    Mobility  Bed Mobility Overal bed mobility: Needs Assistance Bed Mobility: Supine to Sit     Supine to sit: Min guard     General bed mobility comments: Pt in chair upon arrival and requested to sit back iin chair at end of session. Bed mobility not attempted during this session.   Transfers Overall transfer level: Needs assistance   Transfers: Sit to/from Stand Sit to Stand: Supervision         General transfer comment: Supervision with STS, secondary to impulsivity. Requires verbal cues to maintain correct body mechanics and safety awareness. However, pt demo good reciprical gait pattern, with no instances of LOB.   Ambulation/Gait Ambulation/Gait assistance: Supervision;Min guard Ambulation Distance (Feet): 190 Feet Assistive device: Rolling walker (2 wheeled) Gait Pattern/deviations: Step-through pattern     General Gait Details: Initially required CGA, aws L knee was "stiff;" once amb `20 ft, pt became supervision, requiring verbal cues for body mechanics and safety awareness. Follows cues well, but is slightly impulsive.    Stairs Stairs: Yes   Stair Management: Two rails (To mimick door frame) Number of Stairs: 3 (in increments of 1) General stair comments: Pt amb one step with B rails to mimick door frame. Performed x3, with no increase in pai. Required CGA due to impulivity, L LE weakness, and heavy cues for body mechanics and safety awareness. Overall, pt followed instructions well, and improved with each attempt.   Wheelchair Mobility    Modified Rankin (Stroke Patients Only)       Balance Overall  balance assessment: Modified Independent                                          Cognition Arousal/Alertness: Awake/alert Behavior During Therapy: WFL for tasks assessed/performed Overall Cognitive Status: Within  Functional Limits for tasks assessed                                 General Comments: Very talkative and slightly impulsive.       Exercises Total Joint Exercises Goniometric ROM: L knee lacks 1 deg extension; L knee flex at 87 degrees and limited by bandaging, per pt report.  Other Exercises Other Exercises: Seated therex in recliner performed with supervision to L LE x15: ankle pumps (B LE's), quad sets, glute sets. required mod verbal and tactile cues to maintain body mechanics with quad sets. Pt education re: HEP, d/c plan, and reveiw of correct body mechanics with functional mobility.   Other Exercises: Supine therex performed to B LE with supervision x10 reps: ankle pumps, glute sets, L hip abd, L LE SLR. Pt requires min verbal cues to maintian correct mechanics.   Other Exercises: Toileting at supervision level to manage transfer, hygeine, and clothing management. Requires verbal cues for correct mechanics and safety awareness. Pt impulsive.     General Comments        Pertinent Vitals/Pain Pain Assessment: Faces Pain Score: 5  (Max pain 5/5 at end of session.) Faces Pain Scale: Hurts a little bit Pain Location: L knee  Pain Descriptors / Indicators: Constant;Discomfort;Operative site guarding Pain Intervention(s): Limited activity within patient's tolerance;Monitored during session;Premedicated before session;Ice applied    Home Living Family/patient expects to be discharged to:: Private residence Living Arrangements: Spouse/significant other Available Help at Discharge: Family Type of Home: House Home Access: Stairs to enter Entrance Stairs-Rails: None (Has doorway) Home Layout: One level Home Equipment: Environmental consultant - 2 wheels (Ordering rollator)      Prior Function Level of Independence: Independent with assistive device(s)      Comments: modI with use of RW for amb. Indep in all ADL's.   PT Goals (current goals can now be found in the care plan section)  Acute Rehab PT Goals Patient Stated Goal: to go home  PT Goal Formulation: With patient/family Time For Goal Achievement: 07/27/16 Potential to Achieve Goals: Good Additional Goals Additional Goal #1: Pt to amb >125 ft with modI and LRAD with reports of pain no > 2/10 for return to PLOF. Progress towards PT goals: Progressing toward goals    Frequency    BID      PT Plan Current plan remains appropriate    Co-evaluation              AM-PAC PT "6 Clicks" Daily Activity  Outcome Measure  Difficulty turning over in bed (including adjusting bedclothes, sheets and blankets)?: Total Difficulty moving from lying on back to sitting on the side of the bed? : Total Difficulty sitting down on and standing up from a chair with arms (e.g., wheelchair, bedside commode, etc,.)?: A Little Help needed moving to and from a bed to chair (including a wheelchair)?: A Little Help needed walking in hospital room?: A Little Help needed climbing 3-5 steps with a railing? : A Little 6 Click Score: 14    End of Session Equipment Utilized During Treatment:  Gait belt Activity Tolerance: Patient tolerated treatment well Patient left: in chair;with call bell/phone within reach;with chair alarm set;with family/visitor present;with SCD's reapplied Nurse Communication: Mobility status PT Visit Diagnosis: Other abnormalities of gait and mobility (R26.89);Muscle weakness (generalized) (M62.81);History of falling (Z91.81);Pain Pain - Right/Left: Left Pain - part of body: Knee     Time: 9604-5409 PT Time Calculation (min) (ACUTE ONLY): 36 min  Charges:  $Therapeutic Exercise: 8-22 mins $Therapeutic Activity: 8-22 mins                    G Codes:       Sharman Cheek PT, SPT   Latanya Maudlin 07/13/2016, 2:15 PM

## 2016-07-13 NOTE — Anesthesia Postprocedure Evaluation (Signed)
Anesthesia Post Note  Patient: Courtney Wilcox  Procedure(s) Performed: Procedure(s) (LRB): COMPUTER ASSISTED TOTAL KNEE ARTHROPLASTY (Left)  Patient location during evaluation: PACU Anesthesia Type: General Level of consciousness: awake and alert and oriented Pain management: pain level controlled Vital Signs Assessment: post-procedure vital signs reviewed and stable Respiratory status: spontaneous breathing Cardiovascular status: blood pressure returned to baseline Anesthetic complications: no     Last Vitals:  Vitals:   07/13/16 0537 07/13/16 0800  BP: (!) 128/55 110/81  Pulse: (!) 54 60  Resp: 17 16  Temp: 36.8 C 37 C    Last Pain:  Vitals:   07/13/16 1228  TempSrc:   PainSc: 3                  Briza Bark

## 2016-07-13 NOTE — Evaluation (Signed)
Physical Therapy Evaluation Patient Details Name: Courtney Wilcox MRN: [id number] DOB: 1945-01-26 Today's Date: 07/13/2016   History of Present Illness  Pt is a 71 yo F, admitted to acute care post-op L TKA. Prior to admission pt ModI with all ADL's, using RW for amb; lives with spouse. PMH includes: anxiety, depression, arthritis, OA , anemia, allergic rhinitis, B12 deficiency, atrophic vaginitis, chronic bronchitis, cervical disc disease, foot ulcer, GERD, hammer toe, HLD, IBS, OSA, osteopenia, and tonsillitis. Per pt report, she also has history of R TKA.   Clinical Impression  Pt is a pleasant 71 y.o. F, admitted to acute care post-op L TKA. Pt performs bed mobility with min guard due to impulsivity and pt uneasy about allowing L LE "dangle." However, pt expressed no c/o pain or discomfort once performing transition. Tranfers performed with supervision due to impulsivity and required min cues for body mechanics and safety awareness. Pt performed max 300 ft amb (one break after 20 feet for toileting) with RW and CGA due to impulsivity and L LE weakness. Required min cues for maintaining close proximity to RW, good body mechanics, and safety awareness. Pt demo one instance of L knee buckling, but was able to self-correct with no assist from SPT. L knee AAROM: 87 deg flex and lacks 1 deg ext. Pt demonstrates ability to perform 10 SLRs with independence, therefore does not require KI for mobility. Pt presents with the following deficits: strength, ROM, and safety awareness. Overall, pt responded well to today's treatment with no adverse affects. Pt would benefit from skilled PT to address the previously mentioned impairments and promote return to PLOF.      Follow Up Recommendations Home health PT    Equipment Recommendations  None recommended by PT    Recommendations for Other Services       Precautions / Restrictions Precautions Precautions: Fall Restrictions Weight Bearing Restrictions:  Yes Other Position/Activity Restrictions: WBAT      Mobility  Bed Mobility Overal bed mobility: Needs Assistance Bed Mobility: Supine to Sit     Supine to sit: Min guard     General bed mobility comments: CGA with bed mobility; requires use of bed rail to pull self to sit and SPT provided CGA to L LE secodnary to pain.   Transfers Overall transfer level: Needs assistance   Transfers: Sit to/from Stand Sit to Stand: Supervision         General transfer comment: Supervision with STS transfers, requiring min cues on hand placement, beody mechanics, and safety awareness. Pt impulsive with transfers.   Ambulation/Gait Ambulation/Gait assistance: Min guard Ambulation Distance (Feet): 300 Feet Assistive device: Rolling walker (2 wheeled) Gait Pattern/deviations: Step-through pattern     General Gait Details: Pt able to amb total of 300 ft with RW and CGA. Pt experienced one instance of L knee buckling, but was able to self-correct, without physical assist from SPT. Education re: Technical brewer and safety awareness. Pt able to amb with reciprical gait pattern with min c/o pain. Highly confident with amb, but impulsive.   Stairs            Wheelchair Mobility    Modified Rankin (Stroke Patients Only)       Balance Overall balance assessment: Modified Independent  Pertinent Vitals/Pain Pain Assessment: 0-10 Pain Score: 5  (Max pain 5/5 at end of session.) Pain Location: L knee  Pain Descriptors / Indicators: Constant;Discomfort;Operative site guarding Pain Intervention(s): Limited activity within patient's tolerance;Monitored during session;Premedicated before session;Ice applied    Home Living Family/patient expects to be discharged to:: Private residence Living Arrangements: Spouse/significant other Available Help at Discharge: Family Type of Home: House Home Access: Stairs to enter Entrance  Stairs-Rails: None (Has doorway) Entrance Stairs-Number of Steps: 1 Home Layout: One level Home Equipment: Environmental consultant - 2 wheels (Ordering rollator)      Prior Function Level of Independence: Independent with assistive device(s)         Comments: modI with use of RW for amb. Indep in all ADL's.     Hand Dominance        Extremity/Trunk Assessment   Upper Extremity Assessment Upper Extremity Assessment: Overall WFL for tasks assessed    Lower Extremity Assessment Lower Extremity Assessment: Generalized weakness (MMT to B LE's grossly 4/5)       Communication   Communication: No difficulties  Cognition Arousal/Alertness: Awake/alert Behavior During Therapy: WFL for tasks assessed/performed Overall Cognitive Status: Within Functional Limits for tasks assessed                                 General Comments: Very talkative and slightly impulsive.       General Comments      Exercises Total Joint Exercises Goniometric ROM: L knee lacks 1 deg extension; L knee flex at 87 degrees and limited by bandaging, per pt report.  Other Exercises Other Exercises: Seated therex performed to L LE with supervision x 10 reps: knee flex stretch and quad sets (LE supported by reclyner chair). Min verbal and tactile cues provided for correct mechanics.  Other Exercises: Supine therex performed to B LE with supervision x10 reps: ankle pumps, glute sets, L hip abd, L LE SLR. Pt requires min verbal cues to maintian correct mechanics.   Other Exercises: Toileting at supervision level to manage transfer, hygeine, and clothing management. Requires verbal cues for correct mechanics and safety awareness. Pt impulsive.    Assessment/Plan    PT Assessment Patient needs continued PT services  PT Problem List Decreased strength;Decreased range of motion;Decreased activity tolerance;Decreased balance;Decreased mobility;Decreased coordination;Decreased knowledge of use of DME;Decreased  safety awareness;Pain       PT Treatment Interventions DME instruction;Gait training;Stair training;Functional mobility training;Therapeutic activities;Therapeutic exercise;Balance training;Neuromuscular re-education;Patient/family education    PT Goals (Current goals can be found in the Care Plan section)  Acute Rehab PT Goals Patient Stated Goal: to go home  PT Goal Formulation: With patient/family Time For Goal Achievement: 07/27/16 Potential to Achieve Goals: Good    Frequency BID   Barriers to discharge        Co-evaluation               AM-PAC PT "6 Clicks" Daily Activity  Outcome Measure Difficulty turning over in bed (including adjusting bedclothes, sheets and blankets)?: Total Difficulty moving from lying on back to sitting on the side of the bed? : Total Difficulty sitting down on and standing up from a chair with arms (e.g., wheelchair, bedside commode, etc,.)?: A Little Help needed moving to and from a bed to chair (including a wheelchair)?: A Little Help needed walking in hospital room?: A Little Help needed climbing 3-5 steps with a railing? : A Lot 6 Click Score: 13  End of Session Equipment Utilized During Treatment: Gait belt Activity Tolerance: Patient tolerated treatment well Patient left: in chair;with call bell/phone within reach;with chair alarm set;with family/visitor present;with SCD's reapplied Nurse Communication: Mobility status (Pill in bed) PT Visit Diagnosis: Other abnormalities of gait and mobility (R26.89);Muscle weakness (generalized) (M62.81);History of falling (Z91.81);Pain Pain - Right/Left: Left Pain - part of body: Knee    Time: 0850-0930 PT Time Calculation (min) (ACUTE ONLY): 40 min   Charges:         PT G Codes:        Sharman Cheek PT, SPT   Latanya Maudlin 07/13/2016, 11:14 AM

## 2016-07-13 NOTE — Progress Notes (Addendum)
POD 1. Pt. Alert and oriented. VSS. Pain controlled with meds per MAR. Bone foam and polar care in place. IS at the bedside and pt. Encouraged to use it. Heel up on towel roll.  Pt. Did excellent with dangling at the bedside. Neurochecks WDL. Pt. Resting quietly at this time.

## 2016-07-13 NOTE — Evaluation (Signed)
Occupational Therapy Evaluation Patient Details Name: Courtney Wilcox MRN: [id number] DOB: 1945/02/23 Today's Date: 07/13/2016    History of Present Illness Pt is a 71 yo F, admitted to acute care post-op L TKA. Prior to admission pt ModI with all ADL's, using RW for amb; lives with spouse. PMH includes: anxiety, depression, arthritis, OA , anemia, allergic rhinitis, B12 deficiency, atrophic vaginitis, chronic bronchitis, cervical disc disease, foot ulcer, GERD, hammer toe, HLD, IBS, OSA, osteopenia, and tonsillitis. Per pt report, she also has history of R TKA.    Clinical Impression   Pt is 71 year old female s/p L TKA who lives at home with her husband. She has 2 daughter with grand daughters in the area who are able to help her after she goes home.  Pt was independent in all ADLs prior to surgery using a RW for ambulation with moderate to severe pain in L knee and is eager to return to PLOF.  Pt currently requires min assist for LB dressing while in seated position due to pain and limited AROM of L knee.  Pt would benefit from instruction in dressing techniques with or without assistive devices for dressing and bathing skills.  Pt would also benefit from recommendations for home modifications to increase safety in the bathroom and prevent falls. Since she had her R knee replaced 9 years ago and is familiar with AD, most likely she will not need any HH OT services after discharge.     Follow Up Recommendations  No OT follow up    Equipment Recommendations  Other (comment) (rec non slip shower mat in and outside of tub)    Recommendations for Other Services       Precautions / Restrictions Precautions Precautions: Fall Restrictions Weight Bearing Restrictions: Yes Other Position/Activity Restrictions: WBAT      Mobility Bed Mobility               General bed mobility comments: Pt in chair upon arrival and requested to sit back iin chair at end of session. Bed mobility not  attempted during this session.   Transfers Overall transfer level: Needs assistance   Transfers: Sit to/from Stand Sit to Stand: Supervision         General transfer comment: Supervision with STS, secondary to impulsivity. Requires verbal cues to maintain correct body mechanics and safety awareness. However, pt demo good reciprical gait pattern, with no instances of LOB.     Balance                                           ADL either performed or assessed with clinical judgement   ADL Overall ADL's : Needs assistance/impaired Eating/Feeding: Independent;Set up   Grooming: Wash/dry hands;Wash/dry face;Applying deodorant;Oral care;Brushing hair;Independent;Set up           Upper Body Dressing : Independent;Set up   Lower Body Dressing: Minimal assistance;Set up;With adaptive equipment;Sit to/from stand Lower Body Dressing Details (indicate cue type and reason): using reacher and sock aid but most likely will not need sock aid at home. cues for impulsivity                     Vision Baseline Vision/History: Wears glasses Wears Glasses: At all times Patient Visual Report: No change from baseline       Perception     Praxis  Pertinent Vitals/Pain Pain Assessment: Faces Faces Pain Scale: Hurts a little bit Pain Location: L knee  Pain Descriptors / Indicators: Constant;Discomfort;Operative site guarding Pain Intervention(s): Limited activity within patient's tolerance;Monitored during session;Premedicated before session;Repositioned     Hand Dominance Right   Extremity/Trunk Assessment Upper Extremity Assessment Upper Extremity Assessment: Overall WFL for tasks assessed   Lower Extremity Assessment Lower Extremity Assessment: Defer to PT evaluation       Communication Communication Communication: No difficulties   Cognition Arousal/Alertness: Awake/alert Behavior During Therapy: WFL for tasks assessed/performed Overall  Cognitive Status: Within Functional Limits for tasks assessed                                 General Comments: Very talkative and slightly impulsive.    General Comments       Exercises Exercises: Other exercises Other Exercises Other Exercises: Seated therex in recliner performed with supervision to L LE x15: ankle pumps (B LE's), quad sets, glute sets. required mod verbal and tactile cues to maintain body mechanics with quad sets. Pt education re: HEP, d/c plan, and reveiw of correct body mechanics with functional mobility.     Shoulder Instructions      Home Living Family/patient expects to be discharged to:: Private residence Living Arrangements: Spouse/significant other Available Help at Discharge: Family Type of Home: House Home Access: Stairs to enter Secretary/administrator of Steps: 1 Entrance Stairs-Rails: None Home Layout: One level     Bathroom Shower/Tub: IT trainer: Standard Bathroom Accessibility: Yes How Accessible: Accessible via walker Home Equipment: Walker - 2 wheels;Shower seat;Adaptive equipment Adaptive Equipment: Reacher        Prior Functioning/Environment Level of Independence: Independent with assistive device(s)        Comments: modI with use of RW for amb. Indep in all ADL's.        OT Problem List: Decreased strength;Decreased range of motion;Decreased activity tolerance;Pain;Decreased safety awareness      OT Treatment/Interventions: Self-care/ADL training;DME and/or AE instruction;Therapeutic activities    OT Goals(Current goals can be found in the care plan section) Acute Rehab OT Goals Patient Stated Goal: to go home  OT Goal Formulation: With patient/family Time For Goal Achievement: 07/27/16 Potential to Achieve Goals: Good ADL Goals Pt Will Perform Lower Body Dressing: with supervision;with set-up;with adaptive equipment (with or without AD sitting in chair) Pt Will Transfer to  Toilet: with set-up;with supervision;bedside commode;regular height toilet (BSC over toilet with FWW for ambulation)  OT Frequency: Min 1X/week   Barriers to D/C:            Co-evaluation              AM-PAC PT "6 Clicks" Daily Activity     Outcome Measure Help from another person eating meals?: None Help from another person taking care of personal grooming?: None Help from another person toileting, which includes using toliet, bedpan, or urinal?: A Little Help from another person bathing (including washing, rinsing, drying)?: A Little Help from another person to put on and taking off regular upper body clothing?: None Help from another person to put on and taking off regular lower body clothing?: A Little 6 Click Score: 21   End of Session    Activity Tolerance: Patient tolerated treatment well Patient left: in bed;with call bell/phone within reach;with bed alarm set;with family/visitor present  OT Visit Diagnosis: Pain;Muscle weakness (generalized) (M62.81);Other abnormalities of gait and mobility (  R26.89) Pain - Right/Left: Left Pain - part of body: Knee                Time: 9798-9211 OT Time Calculation (min): 45 min Charges:  OT General Charges $OT Visit: 1 Procedure OT Evaluation $OT Eval Low Complexity: 1 Procedure OT Treatments $Self Care/Home Management : 23-37 mins G-Codes:        Susanne Borders, OTR/L ascom [phone] 07/13/16, 5:00 PM

## 2016-07-14 LAB — BASIC METABOLIC PANEL
ANION GAP: 5 (ref 5–15)
BUN: 13 mg/dL (ref 6–20)
CO2: 31 mmol/L (ref 22–32)
Calcium: 8.8 mg/dL — ABNORMAL LOW (ref 8.9–10.3)
Chloride: 106 mmol/L (ref 101–111)
Creatinine, Ser: 0.95 mg/dL (ref 0.44–1.00)
Glucose, Bld: 88 mg/dL (ref 65–99)
POTASSIUM: 3.3 mmol/L — AB (ref 3.5–5.1)
SODIUM: 142 mmol/L (ref 135–145)

## 2016-07-14 LAB — CBC
HCT: 30.1 % — ABNORMAL LOW (ref 35.0–47.0)
HEMOGLOBIN: 10.6 g/dL — AB (ref 12.0–16.0)
MCH: 33.2 pg (ref 26.0–34.0)
MCHC: 35.1 g/dL (ref 32.0–36.0)
MCV: 94.4 fL (ref 80.0–100.0)
PLATELETS: 128 10*3/uL — AB (ref 150–440)
RBC: 3.18 MIL/uL — AB (ref 3.80–5.20)
RDW: 14.3 % (ref 11.5–14.5)
WBC: 5.3 10*3/uL (ref 3.6–11.0)

## 2016-07-14 MED ORDER — HYDROXYCHLOROQUINE SULFATE 200 MG PO TABS: 200.0000 mg | ORAL_TABLET | Freq: Every day | ORAL | 0 refills | Status: DC

## 2016-07-14 MED ORDER — TAPENTADOL HCL 50 MG PO TABS: 50.0000 mg | ORAL_TABLET | ORAL | 0 refills | Status: DC | PRN

## 2016-07-14 MED ORDER — ENOXAPARIN SODIUM 30 MG/0.3ML ~~LOC~~ SOLN: 40.0000 mg | SUBCUTANEOUS | 0 refills | Status: DC

## 2016-07-14 NOTE — Progress Notes (Signed)
Physical Therapy Treatment Patient Details Name: LATAVIA GOGA MRN: [id number] DOB: Jun 15, 1945 Today's Date: 07/14/2016    History of Present Illness Pt is a 71 yo F, admitted to acute care post-op L TKA. Prior to admission pt ModI with all ADL's, using RW for amb; lives with spouse. PMH includes: anxiety, depression, arthritis, OA , anemia, allergic rhinitis, B12 deficiency, atrophic vaginitis, chronic bronchitis, cervical disc disease, foot ulcer, GERD, hammer toe, HLD, IBS, OSA, osteopenia, and tonsillitis. Per pt report, she also has history of R TKA.     PT Comments    Pt is a pleasant 71 y.o. F, admitted to acute care post-op L TKA. Found in bed upon arrival, being served breakfast; pt had not received pain medication at arrival. Pt performs bed mobility with minA this am, due to increased soreness and stiffness, likely from lying in bed throughout the night. Tranfers and ambulation performed with CGA due to increased soreness and stiffness. However, pt performed mobility with correct body mechanics and excellent safety awareness. She amb a total of 5 ft this am with RW and reciprical gait pattern. Pt not limited by pain/fatigue/weakness, but her breakfast had arrived, so she transitioned from bed to chair. Pt AAROM to L knee: full ext (0 deg) and 90 deg flex. Pt continues to present with the following deficits: strength, AROM, and pain. Overall, pt responded well to today's treatment with no adverse affects; she has met all functional goals.. Pt would benefit from continued skilled PT to address the previously mentioned impairments and promote return to PLOF. Currently recommending HHPT, pending d/c.    Follow Up Recommendations  Home health PT     Equipment Recommendations  None recommended by PT    Recommendations for Other Services       Precautions / Restrictions Precautions Precautions: Fall Restrictions Weight Bearing Restrictions: Yes Other Position/Activity  Restrictions: WBAT    Mobility  Bed Mobility Overal bed mobility: Needs Assistance Bed Mobility: Supine to Sit     Supine to sit: Min assist     General bed mobility comments: Pt required minA this am, secondary to increased stiffness and soreness through L knee  Transfers Overall transfer level: Needs assistance Equipment used: Rolling walker (2 wheeled) Transfers: Sit to/from Stand Sit to Stand: Min guard         General transfer comment: Min gurad with transfers this am, due to increased soreness and stiffness through L LE. Ptrequired increased time to obtain erect posture. Required no cues to maintain correct body mechanics/maintain safety awareness.   Ambulation/Gait Ambulation/Gait assistance: Min guard Ambulation Distance (Feet): 5 Feet Assistive device: Rolling walker (2 wheeled) Gait Pattern/deviations: Step-through pattern     General Gait Details: Min guard with amb this am, secondary to increased stiffness and soreness. Pt required no cueing, maintaining correct mechanics and safety awareness.    Stairs            Wheelchair Mobility    Modified Rankin (Stroke Patients Only)       Balance                                            Cognition Arousal/Alertness: Awake/alert Behavior During Therapy: WFL for tasks assessed/performed Overall Cognitive Status: Within Functional Limits for tasks assessed  General Comments: Very talkative and slightly impulsive.       Exercises Total Joint Exercises Goniometric ROM: L knee: able to obtain full ext (0 degrees); pt acheived 90 deg flex.  Other Exercises Other Exercises: Supine therex with supervision x15 reps: ankle pumps to B ft and quad sets to L knee. Required min verbal/tactile cues to correctly perform quad sets. Other Exercises: Seated therex performed with supervision to L LE x15 reps: flexion stretch with towel beneath L ft. Min  verbal cues required to obtain full AROM.     General Comments        Pertinent Vitals/Pain Pain Assessment: Faces Faces Pain Scale: Hurts little more Pain Location: L knee  Pain Descriptors / Indicators: Constant;Discomfort;Operative site guarding Pain Intervention(s): Limited activity within patient's tolerance;Monitored during session;RN gave pain meds during session;Repositioned;Ice applied    Home Living                      Prior Function            PT Goals (current goals can now be found in the care plan section) Acute Rehab PT Goals Patient Stated Goal: to go home  PT Goal Formulation: With patient/family Time For Goal Achievement: 07/27/16 Potential to Achieve Goals: Good Progress towards PT goals: Progressing toward goals    Frequency    BID      PT Plan Current plan remains appropriate    Co-evaluation              AM-PAC PT "6 Clicks" Daily Activity  Outcome Measure  Difficulty turning over in bed (including adjusting bedclothes, sheets and blankets)?: Total Difficulty moving from lying on back to sitting on the side of the bed? : Total Difficulty sitting down on and standing up from a chair with arms (e.g., wheelchair, bedside commode, etc,.)?: Total Help needed moving to and from a bed to chair (including a wheelchair)?: A Little Help needed walking in hospital room?: A Little Help needed climbing 3-5 steps with a railing? : A Little 6 Click Score: 12    End of Session Equipment Utilized During Treatment: Gait belt Activity Tolerance: Patient tolerated treatment well Patient left: in chair;with call bell/phone within reach;with chair alarm set;with SCD's reapplied (cryotherapy applied) Nurse Communication: Mobility status PT Visit Diagnosis: Other abnormalities of gait and mobility (R26.89);Muscle weakness (generalized) (M62.81);History of falling (Z91.81);Pain Pain - Right/Left: Left Pain - part of body: Knee     Time:  7510-2585 PT Time Calculation (min) (ACUTE ONLY): 21 min  Charges:                       G Codes:       Oran Rein PT, SPT   Bevelyn Ngo 07/14/2016, 9:25 AM

## 2016-07-14 NOTE — Care Management Important Message (Signed)
Important Message  Patient Details  Name: Courtney Wilcox MRN: [id number] Date of Birth: January 16, 1946   Medicare Important Message Given:       Marily Memos, RN 07/14/2016, 8:22 AM

## 2016-07-14 NOTE — Care Management Note (Signed)
Case Management Note  Patient Details  Name: Courtney Wilcox MRN: [id number] Date of Birth: 1945/03/12  Subjective/Objective:    Discharging today                Action/Plan: [facility] notified of discharge. Cost of Lovenox is $ 75.40. Patient denies issues paying for medications.   Expected Discharge Date:  07/14/16               Expected Discharge Plan:  Home w Home Health Services  In-House Referral:     Discharge planning Services  CM Consult  Post Acute Care Choice:  Home Health Choice offered to:  Patient  DME Arranged:    DME Agency:     HH Arranged:  PT HH Agency:  [facility] (formerly [facility]  Status of Service:  Completed, signed off  If discussed at [employer], dates discussed:    Additional Comments:  Marily Memos, RN 07/14/2016, 8:16 AM

## 2016-07-14 NOTE — Progress Notes (Signed)
Subjective: 2 Days Post-Op Procedure(s) (LRB): COMPUTER ASSISTED TOTAL KNEE ARTHROPLASTY (Left) Patient reports pain as 5 on 0-10 scale.   Patient is well, and has had no acute complaints or problems Patient did extremely well yesterday with physical therapy met all goals to go home today. Plan is to go Home after hospital stay. no nausea and no vomiting Patient denies any chest pains or shortness of breath. Patient a little quad side this morning. Appears to be slightly exhausted from yesterday's workout.  Objective: Vital signs in last 24 hours: Temp:  [97.8 F (36.6 C)-98.7 F (37.1 C)] 98.7 F (37.1 C) (06/28 2242) Pulse Rate:  [59-64] 64 (06/28 2242) Resp:  [16-18] 18 (06/28 2242) BP: (110-141)/(46-81) 126/46 (06/28 2242) SpO2:  [95 %-100 %] 96 % (06/28 2242) well approximated incision Heels are non tender and elevated off the bed using rolled towels Intake/Output from previous day: 06/28 0701 - 06/29 0700 In: 600 [P.O.:600] Out: 250 [Drains:250] Intake/Output this shift: No intake/output data recorded.   Recent Labs  07/12/16 1128 07/13/16 0538 07/14/16 0525  HGB 10.9* 10.8* 10.6*    Recent Labs  07/13/16 0538 07/14/16 0525  WBC 7.0 5.3  RBC 3.35* 3.18*  HCT 31.5* 30.1*  PLT 148* 128*    Recent Labs  07/13/16 0538 07/14/16 0525  NA 142 142  K 4.1 3.3*  CL 108 106  CO2 31 31  BUN 11 13  CREATININE 0.99 0.95  GLUCOSE 112* 88  CALCIUM 8.7* 8.8*   No results for input(s): LABPT, INR in the last 72 hours.  EXAM General - Patient is Alert, Appropriate and Oriented Extremity - Neurologically intact Neurovascular intact Sensation intact distally Intact pulses distally Dorsiflexion/Plantar flexion intact No cellulitis present Compartment soft Dressing - moderate drainage. This is mostly to the distal dressing and appeared to be more serosanguineous. Motor Function - intact, moving foot and toes well on exam.    Past Medical History:   Diagnosis Date  . Allergic rhinitis   . Anemia   . Anxiety   . Arthritis   . Atrophic vaginitis   . B12 deficiency   . Bronchitis, chronic ([place])   . Cervical disc disease   . Depression   . Foot ulcer ([place])   . GERD (gastroesophageal reflux disease)   . Hammer toe   . Hyperlipidemia   . IBS (irritable bowel syndrome)   . Obesity   . Obstructive sleep apnea   . OSA (obstructive sleep apnea)   . Osteoarthrosis    Multi site  . Osteopenia   . Tonsillitis    recurrent    Assessment/Plan: 2 Days Post-Op Procedure(s) (LRB): COMPUTER ASSISTED TOTAL KNEE ARTHROPLASTY (Left) Active Problems:   S/P total knee arthroplasty  Estimated body mass index is 36.66 kg/m as calculated from the following:   Height as of this encounter: _0  (1.549 m).   Weight as of this encounter: 88 kg (194 lb 0.1 oz). Up with therapy Discharge home with home health  Labs: Were reviewed. Potassium was 3.3. Looking over her past medical records this is the range that she stays in. DVT Prophylaxis - Lovenox, Foot Pumps and TED hose Weight-Bearing as tolerated to left leg Hemovac discontinued on today's visit Please wash the operative leg and apply TED stockings after a new dressing is applied. Please give the patient 2 extra honeycomb dressings to take home. Recommend that the patient follow up with her primary care physician for evaluation of her potassium.  Jillyn Ledger.  [place] 07/14/2016, 7:36 AM

## 2016-07-14 NOTE — Progress Notes (Signed)
Patient is being discharged home. IV removed by Martie Lee, CNA. Reviewed meds, scripts, last dose given, lovenox, and bone foam.  Discharge was reviewed with daughter at bedside. Allowed time for questions. Changed dressing and gave extra for patient to take home.

## 2016-12-13 DIAGNOSIS — M1611 Unilateral primary osteoarthritis, right hip: Secondary | ICD-10-CM | POA: Insufficient documentation

## 2016-12-13 DIAGNOSIS — M48062 Spinal stenosis, lumbar region with neurogenic claudication: Secondary | ICD-10-CM | POA: Insufficient documentation

## 2017-01-25 ENCOUNTER — Encounter
Admission: RE | Admit: 2017-01-25 | Discharge: 2017-01-25 | Disposition: A | Discharge status: Home / Self Care | Payer: Medicare Other | Source: Ambulatory Visit | Attending: Orthopedic Surgery | Admitting: Orthopedic Surgery

## 2017-01-25 ENCOUNTER — Other Ambulatory Visit: Payer: Self-pay

## 2017-01-25 DIAGNOSIS — Z01810 Encounter for preprocedural cardiovascular examination: Secondary | ICD-10-CM | POA: Insufficient documentation

## 2017-01-25 DIAGNOSIS — M1611 Unilateral primary osteoarthritis, right hip: Secondary | ICD-10-CM | POA: Insufficient documentation

## 2017-01-25 DIAGNOSIS — Z01812 Encounter for preprocedural laboratory examination: Secondary | ICD-10-CM | POA: Diagnosis present

## 2017-01-25 DIAGNOSIS — R9431 Abnormal electrocardiogram [ECG] [EKG]: Secondary | ICD-10-CM | POA: Insufficient documentation

## 2017-01-25 HISTORY — DX: Rheumatoid arthritis, unspecified: M06.9

## 2017-01-25 HISTORY — DX: Unspecified asthma, uncomplicated: J45.909

## 2017-01-25 HISTORY — DX: Umbilical hernia without obstruction or gangrene: K42.9

## 2017-01-25 HISTORY — DX: Zoster without complications: B02.9

## 2017-01-25 HISTORY — DX: Nontoxic single thyroid nodule: E04.1

## 2017-01-25 LAB — TYPE AND SCREEN
ABO/RH(D): O POS
Antibody Screen: NEGATIVE

## 2017-01-25 LAB — COMPREHENSIVE METABOLIC PANEL
ALBUMIN: 3.6 g/dL (ref 3.5–5.0)
ALK PHOS: 97 U/L (ref 38–126)
ALT: 15 U/L (ref 14–54)
ANION GAP: 8 (ref 5–15)
AST: 26 U/L (ref 15–41)
BILIRUBIN TOTAL: 0.6 mg/dL (ref 0.3–1.2)
BUN: 10 mg/dL (ref 6–20)
CALCIUM: 9.1 mg/dL (ref 8.9–10.3)
CO2: 29 mmol/L (ref 22–32)
CREATININE: 0.99 mg/dL (ref 0.44–1.00)
Chloride: 104 mmol/L (ref 101–111)
GFR calc Af Amer: >60 mL/min (ref >60)
GFR calc non Af Amer: 57 mL/min — ABNORMAL LOW (ref >60)
GLUCOSE: 92 mg/dL (ref 65–99)
Potassium: 3.2 mmol/L — ABNORMAL LOW (ref 3.5–5.1)
Sodium: 141 mmol/L (ref 135–145)
TOTAL PROTEIN: 6.4 g/dL — AB (ref 6.5–8.1)

## 2017-01-25 LAB — URINALYSIS, ROUTINE W REFLEX MICROSCOPIC
Bilirubin Urine: NEGATIVE
Glucose, UA: NEGATIVE mg/dL
Hgb urine dipstick: NEGATIVE
Ketones, ur: NEGATIVE mg/dL
Nitrite: NEGATIVE
Protein, ur: NEGATIVE mg/dL
SPECIFIC GRAVITY, URINE: 1.015 (ref 1.005–1.030)
pH: 5.5 (ref 5.0–8.0)

## 2017-01-25 LAB — PROTIME-INR
INR: 0.97
Prothrombin Time: 12.8 seconds (ref 11.4–15.2)

## 2017-01-25 LAB — CBC
HEMATOCRIT: 34.1 % — AB (ref 35.0–47.0)
HEMOGLOBIN: 11.6 g/dL — AB (ref 12.0–16.0)
MCH: 32 pg (ref 26.0–34.0)
MCHC: 34.1 g/dL (ref 32.0–36.0)
MCV: 93.9 fL (ref 80.0–100.0)
Platelets: 152 10*3/uL (ref 150–440)
RBC: 3.63 MIL/uL — ABNORMAL LOW (ref 3.80–5.20)
RDW: 13.9 % (ref 11.5–14.5)
WBC: 4.8 10*3/uL (ref 3.6–11.0)

## 2017-01-25 LAB — SEDIMENTATION RATE: Sed Rate: 15 mm/hr (ref 0–30)

## 2017-01-25 LAB — C-REACTIVE PROTEIN

## 2017-01-25 LAB — URINALYSIS, MICROSCOPIC (REFLEX)
BACTERIA UA: NONE SEEN
RBC / HPF: NONE SEEN RBC/hpf (ref 0–5)

## 2017-01-25 LAB — SURGICAL PCR SCREEN
MRSA, PCR: NEGATIVE
STAPHYLOCOCCUS AUREUS: POSITIVE — AB

## 2017-01-25 LAB — APTT: APTT: 27 s (ref 24–36)

## 2017-01-25 NOTE — Patient Instructions (Signed)
Your procedure is scheduled on: Wednesday, January 31, 2017 Report to Same Day Surgery on the 2nd floor in the [facility]. To find out your arrival time, please call [phone] between 1PM - 3PM on: Tuesday, January 30, 2017  REMEMBER: Instructions that are not followed completely may result in serious medical risk, up to and including death; or upon the discretion of your surgeon and anesthesiologist your surgery may need to be rescheduled.  Do not eat food after midnight the night before your procedure.  No gum chewing or hard candies.  You may however, drink CLEAR liquids up to 2 hours before you are scheduled to arrive at the hospital for your procedure.  Do not drink clear liquids within 2 hours of the start of your surgery.  Clear liquids include: - water  - apple juice without pulp - clear gatorade - black coffee or tea (Do NOT add anything to the coffee or tea) Do NOT drink anything that is not on this list.  No Alcohol for 24 hours before or after surgery.  No Smoking including e-cigarettes for 24 hours prior to surgery. No chewable tobacco products for at least 6 hours prior to surgery. No nicotine patches on the day of surgery.  Notify your doctor if there is any change in your medical condition (cold, fever, infection).  Do not wear jewelry, make-up, hairpins, clips or nail polish.  Do not wear lotions, powders, or perfumes. You may wear deodorant.  Do not shave 48 hours prior to surgery.   Contacts and dentures may not be worn into surgery.  Do not bring valuables to the hospital. [facility] is not responsible for any belongings or valuables.  TAKE THESE MEDICATIONS THE MORNING OF SURGERY WITH A SIP OF WATER:  1.  SYMBICORT INHALER 2.  OMEPRAZOLE 3.  TRAMADOL (if needed for pain)  Use CHG Soap or wipes as directed on instruction sheet.  Use inhalers on the day of surgery and bring to the hospital.  NOW!  Stop Anti-inflammatories such as MELOXICAM,  Advil, Aleve, Ibuprofen, Motrin, Naproxen, Naprosyn, Goodie powder, or aspirin products. (May take Tylenol or Acetaminophen if needed.)  NOW!  Stop ANY OVER THE COUNTER supplements (POTASSIUM) until after surgery. (May continue Vitamin D.)  If you are being admitted to the hospital overnight, leave your suitcase in the car. After surgery it may be brought to your room.  Please call the number above if you have any questions about these instructions.

## 2017-01-26 LAB — URINE CULTURE
Culture: <10000 — AB
Special Requests: NORMAL

## 2017-01-29 NOTE — Pre-Procedure Instructions (Signed)
EKG/REQUEST FOR CLEARANCE , AS INSTRUCTED BY DR Noralyn Pick FAXED TO DR [name] Legato AND LM FOR TIFFANY

## 2017-01-30 MED ORDER — CEFAZOLIN SODIUM-DEXTROSE 2-4 GM/100ML-% IV SOLN: 2.0000 g | INTRAVENOUS | Status: DC

## 2017-01-30 MED ORDER — SODIUM CHLORIDE 0.9 % IV SOLN
1000.0000 mg | INTRAVENOUS | Status: DC
  Filled 2017-01-30: qty 10

## 2017-01-31 ENCOUNTER — Inpatient Hospital Stay
Admission: RE | Admit: 2017-01-31 | Discharge: 2017-02-03 | DRG: 470 | Disposition: A | Discharge status: Home Health | Payer: Medicare Other | Source: Ambulatory Visit | Attending: Orthopedic Surgery | Admitting: Orthopedic Surgery

## 2017-01-31 ENCOUNTER — Encounter
Admission: RE | Disposition: A | Discharge status: Home Health | Payer: Self-pay | Source: Ambulatory Visit | Attending: Orthopedic Surgery

## 2017-01-31 ENCOUNTER — Inpatient Hospital Stay: Payer: Medicare Other | Admitting: Anesthesiology

## 2017-01-31 ENCOUNTER — Other Ambulatory Visit: Payer: Self-pay

## 2017-01-31 ENCOUNTER — Encounter: Payer: Self-pay | Admitting: Orthopedic Surgery

## 2017-01-31 ENCOUNTER — Inpatient Hospital Stay: Payer: Medicare Other

## 2017-01-31 DIAGNOSIS — G4733 Obstructive sleep apnea (adult) (pediatric): Secondary | ICD-10-CM | POA: Diagnosis present

## 2017-01-31 DIAGNOSIS — Z79899 Other long term (current) drug therapy: Secondary | ICD-10-CM | POA: Diagnosis not present

## 2017-01-31 DIAGNOSIS — J45909 Unspecified asthma, uncomplicated: Secondary | ICD-10-CM | POA: Diagnosis present

## 2017-01-31 DIAGNOSIS — Z882 Allergy status to sulfonamides status: Secondary | ICD-10-CM

## 2017-01-31 DIAGNOSIS — G4730 Sleep apnea, unspecified: Secondary | ICD-10-CM | POA: Diagnosis present

## 2017-01-31 DIAGNOSIS — Z881 Allergy status to other antibiotic agents status: Secondary | ICD-10-CM | POA: Diagnosis not present

## 2017-01-31 DIAGNOSIS — K589 Irritable bowel syndrome without diarrhea: Secondary | ICD-10-CM | POA: Diagnosis present

## 2017-01-31 DIAGNOSIS — F329 Major depressive disorder, single episode, unspecified: Secondary | ICD-10-CM | POA: Diagnosis present

## 2017-01-31 DIAGNOSIS — E669 Obesity, unspecified: Secondary | ICD-10-CM | POA: Diagnosis present

## 2017-01-31 DIAGNOSIS — Z888 Allergy status to other drugs, medicaments and biological substances status: Secondary | ICD-10-CM | POA: Diagnosis not present

## 2017-01-31 DIAGNOSIS — F419 Anxiety disorder, unspecified: Secondary | ICD-10-CM | POA: Diagnosis present

## 2017-01-31 DIAGNOSIS — M069 Rheumatoid arthritis, unspecified: Secondary | ICD-10-CM | POA: Diagnosis present

## 2017-01-31 DIAGNOSIS — Z6836 Body mass index (BMI) 36.0-36.9, adult: Secondary | ICD-10-CM

## 2017-01-31 DIAGNOSIS — J42 Unspecified chronic bronchitis: Secondary | ICD-10-CM | POA: Diagnosis present

## 2017-01-31 DIAGNOSIS — M8580 Other specified disorders of bone density and structure, unspecified site: Secondary | ICD-10-CM | POA: Diagnosis present

## 2017-01-31 DIAGNOSIS — K219 Gastro-esophageal reflux disease without esophagitis: Secondary | ICD-10-CM | POA: Diagnosis present

## 2017-01-31 DIAGNOSIS — Z885 Allergy status to narcotic agent status: Secondary | ICD-10-CM

## 2017-01-31 DIAGNOSIS — M1611 Unilateral primary osteoarthritis, right hip: Secondary | ICD-10-CM | POA: Diagnosis present

## 2017-01-31 DIAGNOSIS — E785 Hyperlipidemia, unspecified: Secondary | ICD-10-CM | POA: Diagnosis present

## 2017-01-31 DIAGNOSIS — E739 Lactose intolerance, unspecified: Secondary | ICD-10-CM | POA: Diagnosis present

## 2017-01-31 DIAGNOSIS — R000 Tachycardia, unspecified: Secondary | ICD-10-CM | POA: Diagnosis not present

## 2017-01-31 DIAGNOSIS — Z96649 Presence of unspecified artificial hip joint: Secondary | ICD-10-CM

## 2017-01-31 HISTORY — PX: TOTAL HIP ARTHROPLASTY: SHX124

## 2017-01-31 LAB — POCT I-STAT 4, (NA,K, GLUC, HGB,HCT)
GLUCOSE: 81 mg/dL (ref 65–99)
HCT: 34 % — ABNORMAL LOW (ref 36.0–46.0)
HEMOGLOBIN: 11.6 g/dL — AB (ref 12.0–15.0)
POTASSIUM: 3.3 mmol/L — AB (ref 3.5–5.1)
Sodium: 143 mmol/L (ref 135–145)

## 2017-01-31 SURGERY — ARTHROPLASTY, HIP, TOTAL,POSTERIOR APPROACH
Anesthesia: General | Site: Hip | Laterality: Right | Wound class: Clean

## 2017-01-31 MED ORDER — LIDOCAINE HCL (PF) 2 % IJ SOLN
INTRAMUSCULAR | Status: AC
  Filled 2017-01-31: qty 10

## 2017-01-31 MED ORDER — MIDAZOLAM HCL 2 MG/2ML IJ SOLN
INTRAMUSCULAR | Status: AC
  Filled 2017-01-31: qty 2

## 2017-01-31 MED ORDER — FLEET ENEMA 7-19 GM/118ML RE ENEM: 1.0000 | ENEMA | Freq: Once | RECTAL | Status: DC | PRN

## 2017-01-31 MED ORDER — GABAPENTIN 300 MG PO CAPS
ORAL_CAPSULE | ORAL | Status: AC
  Administered 2017-01-31: 300 mg via ORAL
  Filled 2017-01-31: qty 1

## 2017-01-31 MED ORDER — CELECOXIB 200 MG PO CAPS
400.0000 mg | ORAL_CAPSULE | Freq: Once | ORAL | Status: AC
  Administered 2017-01-31: 400 mg via ORAL

## 2017-01-31 MED ORDER — PROPOFOL 10 MG/ML IV BOLUS
INTRAVENOUS | Status: DC | PRN
  Administered 2017-01-31: 150 mg via INTRAVENOUS

## 2017-01-31 MED ORDER — SIMETHICONE 80 MG PO CHEW
80.0000 mg | CHEWABLE_TABLET | Freq: Four times a day (QID) | ORAL | Status: DC | PRN
  Filled 2017-01-31: qty 1

## 2017-01-31 MED ORDER — DIPHENHYDRAMINE HCL 12.5 MG/5ML PO ELIX: 12.5000 mg | ORAL_SOLUTION | ORAL | Status: DC | PRN

## 2017-01-31 MED ORDER — ONDANSETRON HCL 4 MG/2ML IJ SOLN
INTRAMUSCULAR | Status: DC | PRN
  Administered 2017-01-31: 4 mg via INTRAVENOUS

## 2017-01-31 MED ORDER — EPHEDRINE SULFATE 50 MG/ML IJ SOLN
INTRAMUSCULAR | Status: DC | PRN
  Administered 2017-01-31 (×2): 10 mg via INTRAVENOUS

## 2017-01-31 MED ORDER — TAPENTADOL HCL 50 MG PO TABS
50.0000 mg | ORAL_TABLET | ORAL | Status: DC | PRN
  Administered 2017-01-31 – 2017-02-01 (×4): 50 mg via ORAL
  Filled 2017-01-31 (×4): qty 1

## 2017-01-31 MED ORDER — PROPOFOL 10 MG/ML IV BOLUS
INTRAVENOUS | Status: AC
  Filled 2017-01-31: qty 20

## 2017-01-31 MED ORDER — NYSTATIN 100000 UNIT/ML MT SUSP: 1.0000 mL | Freq: Four times a day (QID) | OROMUCOSAL | Status: DC | PRN

## 2017-01-31 MED ORDER — SUGAMMADEX SODIUM 200 MG/2ML IV SOLN
INTRAVENOUS | Status: DC | PRN
  Administered 2017-01-31: 200 mg via INTRAVENOUS

## 2017-01-31 MED ORDER — POTASSIUM CHLORIDE ER 8 MEQ PO TBCR
4.0000 meq | EXTENDED_RELEASE_TABLET | Freq: Every day | ORAL | Status: DC
  Administered 2017-02-01 – 2017-02-02 (×2): 4 meq via ORAL
  Filled 2017-01-31 (×3): qty 1

## 2017-01-31 MED ORDER — GABAPENTIN 300 MG PO CAPS
300.0000 mg | ORAL_CAPSULE | Freq: Every day | ORAL | Status: DC
  Administered 2017-01-31 – 2017-02-02 (×3): 300 mg via ORAL
  Filled 2017-01-31 (×3): qty 1

## 2017-01-31 MED ORDER — SENNOSIDES-DOCUSATE SODIUM 8.6-50 MG PO TABS
1.0000 | ORAL_TABLET | Freq: Two times a day (BID) | ORAL | Status: DC
  Administered 2017-01-31 – 2017-02-02 (×4): 1 via ORAL
  Filled 2017-01-31 (×7): qty 1

## 2017-01-31 MED ORDER — FERROUS SULFATE 325 (65 FE) MG PO TABS
325.0000 mg | ORAL_TABLET | Freq: Two times a day (BID) | ORAL | Status: DC
  Filled 2017-01-31 (×3): qty 1

## 2017-01-31 MED ORDER — CELECOXIB 200 MG PO CAPS
ORAL_CAPSULE | ORAL | Status: AC
  Administered 2017-01-31: 400 mg via ORAL
  Filled 2017-01-31: qty 2

## 2017-01-31 MED ORDER — MOMETASONE FURO-FORMOTEROL FUM 200-5 MCG/ACT IN AERO
2.0000 | INHALATION_SPRAY | Freq: Two times a day (BID) | RESPIRATORY_TRACT | Status: DC
  Administered 2017-01-31 – 2017-02-01 (×2): 2 via RESPIRATORY_TRACT
  Filled 2017-01-31: qty 8.8

## 2017-01-31 MED ORDER — MORPHINE SULFATE (PF) 2 MG/ML IV SOLN: 2.0000 mg | INTRAVENOUS | Status: DC | PRN

## 2017-01-31 MED ORDER — CITALOPRAM HYDROBROMIDE 20 MG PO TABS
20.0000 mg | ORAL_TABLET | Freq: Every evening | ORAL | Status: DC
  Administered 2017-01-31 – 2017-02-02 (×3): 20 mg via ORAL
  Filled 2017-01-31 (×4): qty 1

## 2017-01-31 MED ORDER — TRAMADOL HCL 50 MG PO TABS
50.0000 mg | ORAL_TABLET | ORAL | Status: DC | PRN
  Administered 2017-02-01 – 2017-02-02 (×2): 50 mg via ORAL
  Administered 2017-02-02: 100 mg via ORAL
  Administered 2017-02-02: 50 mg via ORAL
  Filled 2017-01-31: qty 1
  Filled 2017-01-31: qty 2
  Filled 2017-01-31 (×2): qty 1

## 2017-01-31 MED ORDER — CEFAZOLIN SODIUM-DEXTROSE 2-4 GM/100ML-% IV SOLN
INTRAVENOUS | Status: AC
  Filled 2017-01-31: qty 100

## 2017-01-31 MED ORDER — CEFAZOLIN SODIUM-DEXTROSE 2-4 GM/100ML-% IV SOLN
2.0000 g | Freq: Four times a day (QID) | INTRAVENOUS | Status: AC
  Administered 2017-01-31 – 2017-02-01 (×4): 2 g via INTRAVENOUS
  Filled 2017-01-31 (×4): qty 100

## 2017-01-31 MED ORDER — ROCURONIUM BROMIDE 50 MG/5ML IV SOLN
INTRAVENOUS | Status: AC
  Filled 2017-01-31: qty 1

## 2017-01-31 MED ORDER — PANTOPRAZOLE SODIUM 40 MG PO TBEC
40.0000 mg | DELAYED_RELEASE_TABLET | Freq: Two times a day (BID) | ORAL | Status: DC
  Administered 2017-01-31 – 2017-02-03 (×5): 40 mg via ORAL
  Filled 2017-01-31 (×6): qty 1

## 2017-01-31 MED ORDER — MENTHOL 3 MG MT LOZG
1.0000 | LOZENGE | OROMUCOSAL | Status: DC | PRN
  Filled 2017-01-31: qty 9

## 2017-01-31 MED ORDER — FENTANYL CITRATE (PF) 100 MCG/2ML IJ SOLN
INTRAMUSCULAR | Status: AC
  Administered 2017-01-31: 25 ug via INTRAVENOUS
  Filled 2017-01-31: qty 2

## 2017-01-31 MED ORDER — FENTANYL CITRATE (PF) 100 MCG/2ML IJ SOLN
INTRAMUSCULAR | Status: DC | PRN
  Administered 2017-01-31: 50 ug via INTRAVENOUS
  Administered 2017-01-31: 200 ug via INTRAVENOUS
  Administered 2017-01-31 (×2): 50 ug via INTRAVENOUS
  Administered 2017-01-31: 150 ug via INTRAVENOUS

## 2017-01-31 MED ORDER — FLUTICASONE PROPIONATE 50 MCG/ACT NA SUSP
1.0000 | Freq: Every day | NASAL | Status: DC
  Administered 2017-01-31 – 2017-02-01 (×2): 2 via NASAL
  Filled 2017-01-31: qty 16

## 2017-01-31 MED ORDER — ACETAMINOPHEN 10 MG/ML IV SOLN
INTRAVENOUS | Status: DC | PRN
  Administered 2017-01-31: 1000 mg via INTRAVENOUS

## 2017-01-31 MED ORDER — CEFAZOLIN SODIUM-DEXTROSE 2-3 GM-%(50ML) IV SOLR
INTRAVENOUS | Status: DC | PRN
  Administered 2017-01-31: 2 g via INTRAVENOUS

## 2017-01-31 MED ORDER — CHLORHEXIDINE GLUCONATE 4 % EX LIQD: 60.0000 mL | Freq: Once | CUTANEOUS | Status: DC

## 2017-01-31 MED ORDER — ALUM & MAG HYDROXIDE-SIMETH 200-200-20 MG/5ML PO SUSP: 30.0000 mL | ORAL | Status: DC | PRN

## 2017-01-31 MED ORDER — ONDANSETRON HCL 4 MG/2ML IJ SOLN
INTRAMUSCULAR | Status: AC
  Filled 2017-01-31: qty 2

## 2017-01-31 MED ORDER — POTASSIUM 99 MG PO TABS: 99.0000 mg | ORAL_TABLET | Freq: Every day | ORAL | Status: DC

## 2017-01-31 MED ORDER — TRANEXAMIC ACID 1000 MG/10ML IV SOLN
1000.0000 mg | Freq: Once | INTRAVENOUS | Status: AC
  Administered 2017-01-31: 1000 mg via INTRAVENOUS
  Filled 2017-01-31: qty 10

## 2017-01-31 MED ORDER — BISACODYL 10 MG RE SUPP
10.0000 mg | Freq: Every day | RECTAL | Status: DC | PRN
Start: 2017-01-31 — End: 2017-02-03

## 2017-01-31 MED ORDER — LIDOCAINE HCL (CARDIAC) 20 MG/ML IV SOLN
INTRAVENOUS | Status: DC | PRN
  Administered 2017-01-31: 100 mg via INTRAVENOUS

## 2017-01-31 MED ORDER — ACETAMINOPHEN 10 MG/ML IV SOLN
1000.0000 mg | Freq: Four times a day (QID) | INTRAVENOUS | Status: AC
  Administered 2017-01-31 – 2017-02-01 (×4): 1000 mg via INTRAVENOUS
  Filled 2017-01-31 (×4): qty 100

## 2017-01-31 MED ORDER — PHENYLEPHRINE HCL 10 MG/ML IJ SOLN
INTRAMUSCULAR | Status: DC | PRN
  Administered 2017-01-31 (×4): 50 ug via INTRAVENOUS

## 2017-01-31 MED ORDER — VITAMIN D 1000 UNITS PO TABS
4000.0000 [IU] | ORAL_TABLET | Freq: Every day | ORAL | Status: DC
  Administered 2017-02-01 – 2017-02-03 (×3): 4000 [IU] via ORAL
  Filled 2017-01-31 (×3): qty 4

## 2017-01-31 MED ORDER — DEXAMETHASONE SODIUM PHOSPHATE 10 MG/ML IJ SOLN
INTRAMUSCULAR | Status: AC
  Administered 2017-01-31: 8 mg via INTRAVENOUS
  Filled 2017-01-31: qty 1

## 2017-01-31 MED ORDER — METOPROLOL TARTRATE 5 MG/5ML IV SOLN
INTRAVENOUS | Status: DC | PRN
  Administered 2017-01-31: 2 mg via INTRAVENOUS
  Administered 2017-01-31: 5 mg via INTRAVENOUS

## 2017-01-31 MED ORDER — PROMETHAZINE HCL 25 MG/ML IJ SOLN: 6.2500 mg | INTRAMUSCULAR | Status: DC | PRN

## 2017-01-31 MED ORDER — ROCURONIUM BROMIDE 100 MG/10ML IV SOLN
INTRAVENOUS | Status: DC | PRN
  Administered 2017-01-31 (×2): 50 mg via INTRAVENOUS

## 2017-01-31 MED ORDER — TAPENTADOL HCL 50 MG PO TABS: 100.0000 mg | ORAL_TABLET | ORAL | Status: DC | PRN

## 2017-01-31 MED ORDER — MAGNESIUM HYDROXIDE 400 MG/5ML PO SUSP: 30.0000 mL | Freq: Every day | ORAL | Status: DC | PRN

## 2017-01-31 MED ORDER — TRANEXAMIC ACID 1000 MG/10ML IV SOLN
INTRAVENOUS | Status: DC | PRN
  Administered 2017-01-31: 1000 mg via INTRAVENOUS

## 2017-01-31 MED ORDER — DEXAMETHASONE SODIUM PHOSPHATE 10 MG/ML IJ SOLN
8.0000 mg | Freq: Once | INTRAMUSCULAR | Status: AC
  Administered 2017-01-31: 8 mg via INTRAVENOUS

## 2017-01-31 MED ORDER — ENOXAPARIN SODIUM 30 MG/0.3ML ~~LOC~~ SOLN
30.0000 mg | Freq: Two times a day (BID) | SUBCUTANEOUS | Status: DC
  Administered 2017-02-01 – 2017-02-03 (×5): 30 mg via SUBCUTANEOUS
  Filled 2017-01-31 (×5): qty 0.3

## 2017-01-31 MED ORDER — ONDANSETRON HCL 4 MG/2ML IJ SOLN
4.0000 mg | Freq: Four times a day (QID) | INTRAMUSCULAR | Status: DC | PRN
  Administered 2017-02-01: 4 mg via INTRAVENOUS
  Filled 2017-01-31: qty 2

## 2017-01-31 MED ORDER — PHENYLEPHRINE HCL 10 MG/ML IJ SOLN
INTRAVENOUS | Status: DC | PRN
  Administered 2017-01-31: 15 ug/min via INTRAVENOUS

## 2017-01-31 MED ORDER — METOCLOPRAMIDE HCL 10 MG PO TABS
10.0000 mg | ORAL_TABLET | Freq: Three times a day (TID) | ORAL | Status: AC
  Administered 2017-01-31 – 2017-02-02 (×6): 10 mg via ORAL
  Filled 2017-01-31 (×7): qty 1

## 2017-01-31 MED ORDER — MONTELUKAST SODIUM 10 MG PO TABS: 10.0000 mg | ORAL_TABLET | Freq: Every day | ORAL | Status: DC | PRN

## 2017-01-31 MED ORDER — ACETAMINOPHEN 325 MG PO TABS: 650.0000 mg | ORAL_TABLET | ORAL | Status: DC | PRN

## 2017-01-31 MED ORDER — SUGAMMADEX SODIUM 200 MG/2ML IV SOLN
INTRAVENOUS | Status: AC
  Filled 2017-01-31: qty 2

## 2017-01-31 MED ORDER — ACETAMINOPHEN 650 MG RE SUPP
650.0000 mg | RECTAL | Status: DC | PRN
Start: 2017-01-31 — End: 2017-02-03

## 2017-01-31 MED ORDER — METOPROLOL TARTRATE 5 MG/5ML IV SOLN
INTRAVENOUS | Status: AC
  Filled 2017-01-31: qty 5

## 2017-01-31 MED ORDER — FENTANYL CITRATE (PF) 100 MCG/2ML IJ SOLN
25.0000 ug | INTRAMUSCULAR | Status: DC | PRN
  Administered 2017-01-31 (×4): 25 ug via INTRAVENOUS

## 2017-01-31 MED ORDER — ALPRAZOLAM 0.5 MG PO TABS
0.5000 mg | ORAL_TABLET | Freq: Every day | ORAL | Status: DC
  Administered 2017-01-31 – 2017-02-02 (×3): 0.5 mg via ORAL
  Filled 2017-01-31 (×3): qty 1

## 2017-01-31 MED ORDER — LACTATED RINGERS IV SOLN
INTRAVENOUS | Status: DC
  Administered 2017-01-31 (×2): via INTRAVENOUS

## 2017-01-31 MED ORDER — FENTANYL CITRATE (PF) 250 MCG/5ML IJ SOLN
INTRAMUSCULAR | Status: AC
  Filled 2017-01-31: qty 5

## 2017-01-31 MED ORDER — MEPERIDINE HCL 50 MG/ML IJ SOLN: 6.2500 mg | INTRAMUSCULAR | Status: DC | PRN

## 2017-01-31 MED ORDER — CELECOXIB 200 MG PO CAPS
200.0000 mg | ORAL_CAPSULE | Freq: Two times a day (BID) | ORAL | Status: DC
  Administered 2017-01-31 – 2017-02-03 (×6): 200 mg via ORAL
  Filled 2017-01-31 (×6): qty 1

## 2017-01-31 MED ORDER — GABAPENTIN 300 MG PO CAPS
300.0000 mg | ORAL_CAPSULE | Freq: Once | ORAL | Status: AC
  Administered 2017-01-31: 300 mg via ORAL

## 2017-01-31 MED ORDER — ONDANSETRON HCL 4 MG PO TABS: 4.0000 mg | ORAL_TABLET | Freq: Four times a day (QID) | ORAL | Status: DC | PRN

## 2017-01-31 MED ORDER — ACETAMINOPHEN 10 MG/ML IV SOLN
INTRAVENOUS | Status: AC
  Filled 2017-01-31: qty 100

## 2017-01-31 MED ORDER — ROCURONIUM BROMIDE 50 MG/5ML IV SOLN
INTRAVENOUS | Status: AC
Start: 2017-01-31 — End: 2017-01-31
  Filled 2017-01-31: qty 1

## 2017-01-31 MED ORDER — DEXAMETHASONE SODIUM PHOSPHATE 4 MG/ML IJ SOLN
INTRAMUSCULAR | Status: DC | PRN
  Administered 2017-01-31: 5 mg via INTRAVENOUS

## 2017-01-31 MED ORDER — NEOMYCIN-POLYMYXIN B GU 40-200000 IR SOLN
Status: DC | PRN
  Administered 2017-01-31: 14 mL

## 2017-01-31 MED ORDER — PHENOL 1.4 % MT LIQD
1.0000 | OROMUCOSAL | Status: DC | PRN
  Filled 2017-01-31: qty 177

## 2017-01-31 MED ORDER — SODIUM CHLORIDE 0.9 % IV SOLN
INTRAVENOUS | Status: DC
  Administered 2017-01-31: 19:00:00 via INTRAVENOUS

## 2017-01-31 MED ORDER — DEXAMETHASONE SODIUM PHOSPHATE 10 MG/ML IJ SOLN
10.0000 mg | Freq: Once | INTRAMUSCULAR | Status: AC
  Administered 2017-02-01: 10 mg via INTRAVENOUS
  Filled 2017-01-31: qty 1

## 2017-01-31 SURGICAL SUPPLY — 59 items
0 MONOCRYL 36" CT 1 ×3 IMPLANT
BLADE DRUM FLTD (BLADE) ×3 IMPLANT
BLADE SAW 1 (BLADE) ×3 IMPLANT
CANISTER SUCT 1200ML W/VALVE (MISCELLANEOUS) ×3 IMPLANT
CANISTER SUCT 3000ML PPV (MISCELLANEOUS) ×6 IMPLANT
CAPT HIP TOTAL 2 ×3 IMPLANT
CARTRIDGE OIL MAESTRO DRILL (MISCELLANEOUS) ×1 IMPLANT
DIFFUSER DRILL AIR PNEUMATIC (MISCELLANEOUS) ×3 IMPLANT
DRAPE INCISE IOBAN 66X60 STRL (DRAPES) ×3 IMPLANT
DRAPE SHEET LG 3/4 BI-LAMINATE (DRAPES) ×3 IMPLANT
DRSG DERMACEA 8X12 NADH (GAUZE/BANDAGES/DRESSINGS) ×3 IMPLANT
DRSG OPSITE POSTOP 4X12 (GAUZE/BANDAGES/DRESSINGS) ×3 IMPLANT
DRSG OPSITE POSTOP 4X14 (GAUZE/BANDAGES/DRESSINGS) IMPLANT
DRSG TEGADERM 4X4.75 (GAUZE/BANDAGES/DRESSINGS) ×3 IMPLANT
DURAPREP 26ML APPLICATOR (WOUND CARE) ×3 IMPLANT
ELECT BLADE 6.5 EXT (BLADE) ×3 IMPLANT
ELECT CAUTERY BLADE 6.4 (BLADE) ×3 IMPLANT
EVACUATOR 1/8 PVC DRAIN (DRAIN) ×3 IMPLANT
GLOVE BIOGEL M STRL SZ7.5 (GLOVE) ×6 IMPLANT
GLOVE BIOGEL PI IND STRL 7.0 (GLOVE) ×5 IMPLANT
GLOVE BIOGEL PI IND STRL 9 (GLOVE) ×1 IMPLANT
GLOVE BIOGEL PI INDICATOR 7.0 (GLOVE) ×10
GLOVE BIOGEL PI INDICATOR 9 (GLOVE) ×2
GLOVE INDICATOR 8.0 STRL GRN (GLOVE) ×3 IMPLANT
GLOVE SURG SYN 9.0  PF PI (GLOVE) ×2
GLOVE SURG SYN 9.0 PF PI (GLOVE) ×1 IMPLANT
GOWN STRL REUS W/ TWL LRG LVL3 (GOWN DISPOSABLE) ×3 IMPLANT
GOWN STRL REUS W/TWL 2XL LVL3 (GOWN DISPOSABLE) ×3 IMPLANT
GOWN STRL REUS W/TWL LRG LVL3 (GOWN DISPOSABLE) ×6
HOLDER FOLEY CATH W/STRAP (MISCELLANEOUS) ×3 IMPLANT
HOOD PEEL AWAY FLYTE STAYCOOL (MISCELLANEOUS) ×6 IMPLANT
KIT RM TURNOVER STRD PROC AR (KITS) ×3 IMPLANT
NDL SAFETY ECLIPSE 18X1.5 (NEEDLE) ×1 IMPLANT
NEEDLE HYPO 18GX1.5 SHARP (NEEDLE) ×2
NS IRRIG 500ML POUR BTL (IV SOLUTION) ×3 IMPLANT
OIL CARTRIDGE MAESTRO DRILL (MISCELLANEOUS) ×3
PACK HIP PROSTHESIS (MISCELLANEOUS) ×3 IMPLANT
PIN STEIN THRED 5/32 (Pin) ×3 IMPLANT
PULSAVAC PLUS IRRIG FAN TIP (DISPOSABLE) ×3
SOL .9 NS 3000ML IRR  AL (IV SOLUTION) ×2
SOL .9 NS 3000ML IRR UROMATIC (IV SOLUTION) ×1 IMPLANT
SOL PREP PVP 2OZ (MISCELLANEOUS) ×3
SOLUTION PREP PVP 2OZ (MISCELLANEOUS) ×1 IMPLANT
SPONGE DRAIN TRACH 4X4 STRL 2S (GAUZE/BANDAGES/DRESSINGS) ×3 IMPLANT
STAPLER SKIN PROX 35W (STAPLE) ×3 IMPLANT
SUT ETHIBOND #5 BRAIDED 30INL (SUTURE) ×3 IMPLANT
SUT MNCRL 0 1X36 CT-1 (SUTURE) ×1 IMPLANT
SUT MON AB 2-0 CT1 36 (SUTURE) ×3 IMPLANT
SUT MONOCRYL 0 (SUTURE) ×2
SUT VIC AB 0 CT1 36 (SUTURE) ×3 IMPLANT
SUT VIC AB 1 CT1 36 (SUTURE) ×6 IMPLANT
SUT VIC AB 2-0 CT1 27 (SUTURE) ×2
SUT VIC AB 2-0 CT1 TAPERPNT 27 (SUTURE) ×1 IMPLANT
SYR 20CC LL (SYRINGE) ×3 IMPLANT
TAPE ADH 3 LX (MISCELLANEOUS) ×3 IMPLANT
TAPE TRANSPORE STRL 2 31045 (GAUZE/BANDAGES/DRESSINGS) ×3 IMPLANT
TIP FAN IRRIG PULSAVAC PLUS (DISPOSABLE) ×1 IMPLANT
TOWEL OR 17X26 4PK STRL BLUE (TOWEL DISPOSABLE) ×3 IMPLANT
TRAY FOLEY W/METER SILVER 16FR (SET/KITS/TRAYS/PACK) ×3 IMPLANT

## 2017-01-31 NOTE — Op Note (Signed)
OPERATIVE NOTE  DATE OF SURGERY:  01/31/2017  PATIENT NAME:  Courtney Wilcox   DOB: 1946-01-11  MRN: [id number]  PRE-OPERATIVE DIAGNOSIS: Degenerative arthrosis of the right hip, primary  POST-OPERATIVE DIAGNOSIS:  Same  PROCEDURE:  Right total hip arthroplasty  SURGEON:  Jena Gauss. M.D.  ASSISTANT:  Van Clines, PA (present and scrubbed throughout the case, critical for assistance with exposure, retraction, instrumentation, and closure)  ANESTHESIA: general  ESTIMATED BLOOD LOSS: 400 mL  FLUIDS REPLACED: 1900 mL of crystalloid  DRAINS: 2 medium drains to a Hemovac reservoir  IMPLANTS UTILIZED: DePuy 13.5 mm all stature AML femoral stem, 50 mm OD Pinnacle 100 acetabular component, neutral Pinnacle Marathon polyethylene insert, and a 32 mm CoCr +1 mm hip ball  INDICATIONS FOR SURGERY: Courtney Wilcox is a 73 y.o. year old female with a long history of progressive hip and groin  pain. X-rays demonstrated severe degenerative changes. The patient had not seen any significant improvement despite conservative nonsurgical intervention. After discussion of the risks and benefits of surgical intervention, the patient expressed understanding of the risks benefits and agree with plans for total hip arthroplasty.   The risks, benefits, and alternatives were discussed at length including but not limited to the risks of infection, bleeding, nerve injury, stiffness, blood clots, the need for revision surgery, limb length inequality, dislocation, cardiopulmonary complications, among others, and they were willing to proceed.  PROCEDURE IN DETAIL: The patient was brought into the operating room and, after adequate general anesthesia was achieved, the patient was placed in a left lateral decubitus position. Axillary roll was placed and all bony prominences were well-padded. The patient's right hip was cleaned and prepped with alcohol and DuraPrep and draped in the usual sterile fashion. A  "timeout" was performed as per usual protocol. A lateral curvilinear incision was made gently curving towards the posterior superior iliac spine. The IT band was incised in line with the skin incision and the fibers of the gluteus maximus were split in line. The piriformis tendon was identified, skeletonized, and incised at its insertion to the proximal femur and reflected posteriorly. A T type posterior capsulotomy was performed. Prior to dislocation of the femoral head, a threaded Steinmann pin was inserted through a separate stab incision into the pelvis superior to the acetabulum and bent in the form of a stylus so as to assess limb length and hip offset throughout the procedure. The femoral head was then dislocated posteriorly. Inspection of the femoral head demonstrated severe degenerative changes with full-thickness loss of articular cartilage. The femoral neck cut was performed using an oscillating saw. The anterior capsule was elevated off of the femoral neck using a periosteal elevator. Attention was then directed to the acetabulum. The remnant of the labrum was excised using electrocautery. Inspection of the acetabulum also demonstrated significant degenerative changes. The acetabulum was reamed in sequential fashion up to a 49 mm diameter. Good punctate bleeding bone was encountered. A 50 mm Pinnacle 100 acetabular component was positioned and impacted into place. Good scratch fit was appreciated. A neutral polyethylene trial was inserted.  Attention was then directed to the proximal femur. A hole for reaming of the proximal femoral canal was created using a high-speed burr. The femoral canal was reamed in sequential fashion up to a 13 mm diameter. This allowed for approximately 6 cm of scratch fit. Serial broaches were inserted up to a 13.5 mm small stature femoral broach. Calcar region was planed and a trial reduction was performed  using a 32 mm hip ball with a +1 mm neck length. Good equalization of  limb lengths and hip offset was appreciated and excellent stability was noted both anteriorly and posteriorly. Trial components were removed. The acetabular shell was irrigated with copious amounts of normal saline with antibiotic solution and suctioned dry. A neutral Pinnacle Marathon polyethylene insert was positioned and impacted into place. Next, a 13.5 mm small stature AML femoral stem was positioned and impacted into place. Excellent scratch fit was appreciated. A trial reduction was again performed with a 32 mm hip ball with a +1 mm neck length. Again, good equalization of limb lengths was appreciated and excellent stability appreciated both anteriorly and posteriorly. The hip was then dislocated and the trial hip ball was removed. The Morse taper was cleaned and dried. A 32 mm cobalt chrome hip ball with a swollen mm neck length was placed on the trunnion and impacted into place. The hip was then reduced and placed through range of motion. Excellent stability was appreciated both anteriorly and posteriorly.  The wound was irrigated with copious amounts of normal saline with antibiotic solution and suctioned dry. Good hemostasis was appreciated. The posterior capsulotomy was repaired using #5 Ethibond. Piriformis tendon was reapproximated to the undersurface of the gluteus medius tendon using #5 Ethibond. Two medium drains were placed in the wound bed and brought out through separate stab incisions to be attached to a Hemovac reservoir. The IT band was reapproximated using interrupted sutures of #1 Vicryl. Subcutaneous tissue was approximated using first #0 Monocryl followed by #2-0 Monocryl. The skin was closed with skin staples.  The patient tolerated the procedure well and was transported to the recovery room in stable condition.   Jena Gauss., M.D.

## 2017-01-31 NOTE — Anesthesia Procedure Notes (Signed)
Procedure Name: Intubation Date/Time: 01/31/2017 10:50 AM Performed by: Bernardo Heater, CRNA Pre-anesthesia Checklist: Patient identified, Emergency Drugs available, Suction available and Patient being monitored Patient Re-evaluated:Patient Re-evaluated prior to induction Oxygen Delivery Method: Circle system utilized Preoxygenation: Pre-oxygenation with 100% oxygen Induction Type: IV induction Laryngoscope Size: Mac and 3 Grade View: Grade II Tube type: Oral Tube size: 7.0 mm Number of attempts: 1 Placement Confirmation: ETT inserted through vocal cords under direct vision,  positive ETCO2 and breath sounds checked- equal and bilateral Secured at: 21 cm Tube secured with: Tape Dental Injury: Teeth and Oropharynx as per pre-operative assessment

## 2017-01-31 NOTE — Anesthesia Post-op Follow-up Note (Signed)
Anesthesia QCDR form completed.        

## 2017-01-31 NOTE — Discharge Instructions (Signed)
Instructions after Total Hip Replacement ° ° °  Courtney Wilcox P. Deborah Lazcano, Jr., M.D.    ° Dept. of Orthopaedics & Sports Medicine ° [facility] ° [address] ° [place],   [address] ° Phone: [phone]   Fax: [phone] ° °  °DIET: °• Drink plenty of non-alcoholic fluids. °• Resume your normal diet. Include foods high in fiber. ° °ACTIVITY:  °• You may use crutches or a walker with weight-bearing as tolerated, unless instructed otherwise. °• You may be weaned off of the walker or crutches by your Physical Therapist.  °• Do NOT reach below the level of your knees or cross your legs until allowed.    °• Continue doing gentle exercises. Exercising will reduce the pain and swelling, increase motion, and prevent muscle weakness.   °• Please continue to use the TED compression stockings for 6 weeks. You may remove the stockings at night, but should reapply them in the morning. °• Do not drive or operate any equipment until instructed. ° °WOUND CARE:  °• Continue to use ice packs periodically to reduce pain and swelling. °• Keep the incision clean and dry. °• You may bathe or shower after the staples are removed at the first office visit following surgery. ° °MEDICATIONS: °• You may resume your regular medications. °• Please take the pain medication as prescribed on the medication. °• Do not take pain medication on an empty stomach. °• You have been given a prescription for a blood thinner to prevent blood clots. Please take the medication as instructed. (NOTE: After completing a 2 week course of Lovenox, take one Enteric-coated aspirin once a day.) °• Pain medications and iron supplements can cause constipation. Use a stool softener (Senokot or Colace) on a daily basis and a laxative (dulcolax or miralax) as needed. °• Do not drive or drink alcoholic beverages when taking pain medications. ° °CALL THE OFFICE FOR: °• Temperature above 101 degrees °• Excessive bleeding or drainage on the dressing. °• Excessive  swelling, coldness, or paleness of the toes. °• Persistent nausea and vomiting. ° °FOLLOW-UP:  °• You should have an appointment to return to the office in 6 weeks after surgery. °• Arrangements have been made for continuation of Physical Therapy (either home therapy or outpatient therapy). °  °

## 2017-01-31 NOTE — Transfer of Care (Signed)
Immediate Anesthesia Transfer of Care Note  Patient: Courtney Wilcox  Procedure(s) Performed: TOTAL HIP ARTHROPLASTY (Right Hip)  Patient Location: PACU  Anesthesia Type:General  Level of Consciousness: awake, alert , oriented and patient cooperative  Airway & Oxygen Therapy: Patient Spontanous Breathing and Patient connected to nasal cannula oxygen  Post-op Assessment: Report given to RN and Post -op Vital signs reviewed and stable  Post vital signs: Reviewed and stable  Last Vitals:  Vitals:   01/31/17 1000 01/31/17 1414  BP: (!) 149/86   Pulse: 80   Resp: 17 (P) 18  Temp: 36.9 C (P) 37.3 C  SpO2: 99%     Last Pain:  Vitals:   01/31/17 1414  TempSrc:   PainSc: (P) Asleep         Complications: No apparent anesthesia complications

## 2017-01-31 NOTE — [place] FL2 (Signed)
[place] MEDICAID FL2 LEVEL OF CARE SCREENING TOOL     IDENTIFICATION  Patient Name: Courtney Wilcox Birthdate: 1945/12/19 Sex: female Admission Date (Current Location): 01/31/2017  [place] and [place] Number:  Chiropodist and Address:  [facility], [address] [place], [place] [address]      Provider Number: [id number]  Attending Physician Name and Address:  Donato Heinz, MD  Relative Name and Phone Number:       Current Level of Care: Hospital Recommended Level of Care: Skilled Nursing Facility Prior Approval Number:    Date Approved/Denied:   PASRR Number: [id number]  Discharge Plan: SNF    Current Diagnoses: Patient Active Problem List   Diagnosis Date Noted  . 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/2017  . 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  . Primary osteoarthritis of right hip 12/13/2016  . S/P total knee arthroplasty 07/12/2016  . Asthma, mild 02/19/2013  . Chest pain 02/19/2013  . Rheumatoid arthritis (HCC) 10/30/2012  . Shortness of breath 10/29/2012  . Spinal stenosis in cervical region 10/29/2012    Orientation RESPIRATION BLADDER Height & Weight     Self, Time, Situation, Place  O2, Normal(2 liters) Continent Weight: 194 lb (88 kg) Height:  5\' 1"  (154.9 cm)  BEHAVIORAL SYMPTOMS/MOOD NEUROLOGICAL BOWEL NUTRITION STATUS  (none) (none) Continent Diet(regular)  AMBULATORY STATUS COMMUNICATION OF NEEDS Skin   Extensive Assist Verbally Surgical wounds                       Personal Care Assistance Level of Assistance  Bathing, Dressing, Feeding Bathing Assistance: Limited assistance Feeding assistance: Independent Dressing Assistance: Limited assistance     Functional Limitations Info  (no issues)          SPECIAL CARE FACTORS FREQUENCY  PT (By licensed PT)     PT Frequency: 5X Qweek,               Contractures Contractures Info: Not present     Additional Factors Info  Code Status Code Status Info: full             Current Medications (01/31/2017):  This is the current hospital active medication list Current Facility-Administered Medications  Medication Dose Route Frequency Provider Last Rate Last Dose  . 0.9 %  sodium chloride infusion   Intravenous Continuous Hooten, 02/02/2017, MD      . acetaminophen (OFIRMEV) IV 1,000 mg  1,000 mg Intravenous Q6H Hooten, Illene Labrador, MD      . acetaminophen (TYLENOL) tablet 650 mg  650 mg Oral Q4H PRN Hooten, Illene Labrador, MD       Or  . acetaminophen (TYLENOL) suppository 650 mg  650 mg Rectal Q4H PRN Hooten, Illene Labrador, MD      . ALPRAZolam Illene Labrador) tablet 0.5 mg  0.5 mg Oral QHS Hooten, Prudy Feeler, MD      . alum & mag hydroxide-simeth (MAALOX/MYLANTA) 200-200-20 MG/5ML suspension 30 mL  30 mL Oral Q4H PRN Hooten, 07-25-2000, MD      . bisacodyl (DULCOLAX) suppository 10 mg  10 mg Rectal Daily PRN Hooten, Illene Labrador, MD      . ceFAZolin (ANCEF) 2-4 GM/100ML-% IVPB           . ceFAZolin (ANCEF) IVPB 2g/100 mL premix  2 g Intravenous Q6H Hooten, Illene Labrador, MD      . celecoxib (CELEBREX) capsule 200 mg  200 mg Oral  Q12H Hooten, Illene Labrador, MD      . cholecalciferol (VITAMIN D) tablet 4,000 Units  4,000 Units Oral Daily Hooten, Illene Labrador, MD      . citalopram (CELEXA) tablet 20 mg  20 mg Oral QPM Hooten, Illene Labrador, MD      . Melene Muller ON 02/01/2017] dexamethasone (DECADRON) injection 10 mg  10 mg Intravenous Once Hooten, Illene Labrador, MD      . diphenhydrAMINE (BENADRYL) 12.5 MG/5ML elixir 12.5-25 mg  12.5-25 mg Oral Q4H PRN Hooten, Illene Labrador, MD      . Melene Muller ON 02/01/2017] enoxaparin (LOVENOX) injection 30 mg  30 mg Subcutaneous Q12H Hooten, Illene Labrador, MD      . ferrous sulfate tablet 325 mg  325 mg Oral BID WC Hooten, Illene Labrador, MD      . fluticasone (FLONASE) 50 MCG/ACT nasal spray 1-2 spray  1-2 spray Each Nare Daily Hooten, Illene Labrador, MD      . gabapentin (NEURONTIN) capsule 300 mg  300 mg Oral QHS Hooten, Illene Labrador, MD      .  magnesium hydroxide (MILK OF MAGNESIA) suspension 30 mL  30 mL Oral Daily PRN Hooten, Illene Labrador, MD      . menthol-cetylpyridinium (CEPACOL) lozenge 3 mg  1 lozenge Oral PRN Hooten, Illene Labrador, MD       Or  . phenol (CHLORASEPTIC) mouth spray 1 spray  1 spray Mouth/Throat PRN Hooten, Illene Labrador, MD      . metoCLOPramide (REGLAN) tablet 10 mg  10 mg Oral TID AC & HS Hooten, Illene Labrador, MD      . mometasone-formoterol (DULERA) 200-5 MCG/ACT inhaler 2 puff  2 puff Inhalation BID Hooten, Illene Labrador, MD      . montelukast (SINGULAIR) tablet 10 mg  10 mg Oral Daily PRN Hooten, Illene Labrador, MD      . morphine 2 MG/ML injection 2 mg  2 mg Intravenous Q2H PRN Hooten, Illene Labrador, MD      . nystatin (MYCOSTATIN) 100000 UNIT/ML suspension 100,000 Units  1 mL Oral QID PRN Hooten, Illene Labrador, MD      . ondansetron (ZOFRAN) tablet 4 mg  4 mg Oral Q6H PRN Hooten, Illene Labrador, MD       Or  . ondansetron (ZOFRAN) injection 4 mg  4 mg Intravenous Q6H PRN Hooten, Illene Labrador, MD      . pantoprazole (PROTONIX) EC tablet 40 mg  40 mg Oral BID Hooten, Illene Labrador, MD      . Melene Muller ON 02/01/2017] Potassium TABS 99 mg  99 mg Oral Q lunch Hooten, Illene Labrador, MD      . senna-docusate (Senokot-S) tablet 1 tablet  1 tablet Oral BID Hooten, Illene Labrador, MD      . simethicone (MYLICON) chewable tablet 80 mg  80 mg Oral QID PRN Hooten, Illene Labrador, MD      . sodium phosphate (FLEET) 7-19 GM/118ML enema 1 enema  1 enema Rectal Once PRN Hooten, Illene Labrador, MD      . tapentadol (NUCYNTA) tablet 100 mg  100 mg Oral Q4H PRN Hooten, Illene Labrador, MD      . tapentadol (NUCYNTA) tablet 50 mg  50 mg Oral Q4H PRN Hooten, Illene Labrador, MD      . traMADol Janean Sark) tablet 50-100 mg  50-100 mg Oral Q4H PRN Hooten, Illene Labrador, MD         Discharge Medications: Please see discharge summary for a list of discharge medications.  Relevant  Imaging Results:  Relevant Lab Results:   Additional Information ss: [id number]  York Spaniel, LCSW

## 2017-01-31 NOTE — Progress Notes (Signed)
   01/31/17 1800  Clinical Encounter Type  Visited With Patient  Visit Type Initial;Other (Comment) (HCPOA)  Referral From Nurse  Spiritual Encounters  Spiritual Needs Literature  HCPOA/AD materials dropped off with patient.  CH available to review as needed

## 2017-01-31 NOTE — Progress Notes (Signed)
Pt arrived to room 134 from PACU, awake, A&Ox4. Honeycomb dressing CDI to right hip, hemovac in place. Posterior hip precautions and pain medication regimen reviewed with pt and her daughter. Oriented to unit routine. VSS.  [place], Latricia Heft

## 2017-01-31 NOTE — Anesthesia Postprocedure Evaluation (Signed)
Anesthesia Post Note  Patient: Courtney Wilcox  Procedure(s) Performed: TOTAL HIP ARTHROPLASTY (Right Hip)  Patient location during evaluation: PACU Anesthesia Type: General Level of consciousness: awake and alert and oriented Pain management: pain level controlled Vital Signs Assessment: post-procedure vital signs reviewed and stable Respiratory status: spontaneous breathing, nonlabored ventilation and respiratory function stable Cardiovascular status: blood pressure returned to baseline and stable Postop Assessment: no signs of nausea or vomiting Anesthetic complications: no     Last Vitals:  Vitals:   01/31/17 1459 01/31/17 1514  BP: (!) 126/58 136/70  Pulse: 66 66  Resp: 16 16  Temp:    SpO2: 95% 99%    Last Pain:  Vitals:   01/31/17 1514  TempSrc:   PainSc: Asleep                 Lear Carstens

## 2017-01-31 NOTE — H&P (Signed)
The patient has been re-examined, and the chart reviewed, and there have been no interval changes to the documented history and physical.    The risks, benefits, and alternatives have been discussed at length. The patient expressed understanding of the risks benefits and agreed with plans for surgical intervention.  James P. Hooten, Jr. M.D.    

## 2017-01-31 NOTE — Anesthesia Preprocedure Evaluation (Signed)
Anesthesia Evaluation  Patient identified by MRN, date of birth, ID band Patient awake    Reviewed: Allergy & Precautions, NPO status , Patient's Chart, lab work & pertinent test results  History of Anesthesia Complications Negative for: history of anesthetic complications  Airway Mallampati: II  TM Distance: >3 FB Neck ROM: Full    Dental  (+) Upper Dentures, Edentulous Lower   Pulmonary asthma , sleep apnea and Continuous Positive Airway Pressure Ventilation ,    breath sounds clear to auscultation- rhonchi (-) wheezing      Cardiovascular (-) hypertension(-) CAD, (-) Past MI and (-) Cardiac Stents  Rhythm:Regular Rate:Normal - Systolic murmurs and - Diastolic murmurs    Neuro/Psych PSYCHIATRIC DISORDERS Anxiety Depression negative neurological ROS     GI/Hepatic Neg liver ROS, GERD  ,  Endo/Other  negative endocrine ROSneg diabetes  Renal/GU negative Renal ROS     Musculoskeletal  (+) Arthritis ,   Abdominal (+) + obese,   Peds  Hematology  (+) anemia ,   Anesthesia Other Findings Past Medical History: No date: Allergic rhinitis No date: Anemia No date: Anxiety No date: Arthritis No date: Asthma No date: Atrophic vaginitis No date: B12 deficiency No date: Bronchitis, chronic (HCC) No date: Cervical disc disease No date: Depression No date: Foot ulcer (HCC) No date: GERD (gastroesophageal reflux disease) No date: Hammer toe No date: Hyperlipidemia No date: IBS (irritable bowel syndrome) No date: Obesity No date: Obstructive sleep apnea No date: OSA (obstructive sleep apnea) No date: Osteoarthrosis     Comment:  Multi site No date: Osteopenia No date: Rheumatoid arthritis (HCC) No date: Shingles No date: Thyroid nodule     Comment:  MULTIPLE No date: Tonsillitis     Comment:  recurrent No date: Umbilical hernia   Reproductive/Obstetrics                              Anesthesia Physical Anesthesia Plan  ASA: III  Anesthesia Plan: General   Post-op Pain Management:    Induction: Intravenous  PONV Risk Score and Plan: 2 and Dexamethasone and Ondansetron  Airway Management Planned: Oral ETT  Additional Equipment:   Intra-op Plan:   Post-operative Plan: Extubation in OR  Informed Consent: I have reviewed the patients History and Physical, chart, labs and discussed the procedure including the risks, benefits and alternatives for the proposed anesthesia with the patient or authorized representative who has indicated his/her understanding and acceptance.   Dental advisory given  Plan Discussed with: CRNA and Anesthesiologist  Anesthesia Plan Comments:         Anesthesia Quick Evaluation

## 2017-02-01 ENCOUNTER — Encounter: Payer: Self-pay | Admitting: Orthopedic Surgery

## 2017-02-01 LAB — BASIC METABOLIC PANEL
ANION GAP: 8 (ref 5–15)
BUN: 15 mg/dL (ref 6–20)
CO2: 26 mmol/L (ref 22–32)
Calcium: 8.6 mg/dL — ABNORMAL LOW (ref 8.9–10.3)
Chloride: 104 mmol/L (ref 101–111)
Creatinine, Ser: 1.1 mg/dL — ABNORMAL HIGH (ref 0.44–1.00)
GFR, EST AFRICAN AMERICAN: 58 mL/min — AB (ref >60)
GFR, EST NON AFRICAN AMERICAN: 50 mL/min — AB (ref >60)
GLUCOSE: 104 mg/dL — AB (ref 65–99)
POTASSIUM: 3.8 mmol/L (ref 3.5–5.1)
Sodium: 138 mmol/L (ref 135–145)

## 2017-02-01 MED ORDER — ENOXAPARIN SODIUM 30 MG/0.3ML ~~LOC~~ SOLN: 30.0000 mg | Freq: Two times a day (BID) | SUBCUTANEOUS | 0 refills | Status: DC

## 2017-02-01 MED ORDER — TAPENTADOL HCL 50 MG PO TABS: 50.0000 mg | ORAL_TABLET | ORAL | 0 refills | Status: DC | PRN

## 2017-02-01 MED ORDER — TRAMADOL HCL 50 MG PO TABS: 50.0000 mg | ORAL_TABLET | ORAL | 0 refills | Status: DC | PRN

## 2017-02-01 NOTE — Progress Notes (Signed)
POD 1. Pt. Alert and oriented. VSS. Pain controled with PO pain meds. Foley patent. Pt. Dangled to bedside. Polarcare on and running fresh ice and water. Neurochecks WDL. Resting quietly. Will continue to monitor.

## 2017-02-01 NOTE — Progress Notes (Addendum)
PT in to see patient. Concerned regarding pt's heart rate. Pt laying comfortably in bed with daughter at bedside. Pt has been anxious with nausea all morning but nausea is "getting better" with holding Nucynta and scheduled Reglan and Zofran PRN. Pt noted to be jittery and rambling this AM but alert and oriented x 4. Pt states, "I think it is that pain medication that I took this morning." Discussed pt's ordered pain regimen and pt requests not to take Nucynta for pain but requests to proceed with Tramadol. Pt's pain has been well controlled today with neither pain medications. Pt states, "I have had good results with that Tramadol in the past." Vital signs retaken BP 162/81, temp 98.2, 92% 2 lpm, pulse 78 per dinamap and 72 bpm apical for one minute. Pt denies SOB, chest pain or any acute signs/symptoms. Pt is concerned about getting up due to PT's vital sign assessment. Reassured patient and daughter. Will mobilize patient after supper. Pt states, "I think I need to eat before getting up to chair cause I haven't been able to hold much on my stomach today." Instructed daughter and pt that we need to get her up and get her moving. Will rept all to oncoming night RN and will continue to monitor.

## 2017-02-01 NOTE — Progress Notes (Signed)
   Subjective: 1 Day Post-Op Procedure(s) (LRB): TOTAL HIP ARTHROPLASTY (Right) Patient reports pain as 5 on 0-10 scale.  Patient states the pain feels much better than it did prior to surgery Patient is well, and has had no acute complaints or problems We will start therapy today.  Plan is to go Rehab after hospital stay. no nausea and no vomiting Patient denies any chest pains or shortness of breath. Objective: Vital signs in last 24 hours: Temp:  [97.6 F (36.4 C)-99.1 F (37.3 C)] 98.5 F (36.9 C) (01/17 0353) Pulse Rate:  [65-80] 66 (01/17 0353) Resp:  [15-20] 19 (01/17 0353) BP: (120-152)/(49-86) 131/49 (01/17 0353) SpO2:  [95 %-100 %] 100 % (01/17 0353) Weight:  [88 kg (194 lb)] 88 kg (194 lb) (01/16 1000) well approximated incision Heels are non tender and elevated off the bed using rolled towels Intake/Output from previous day: 01/16 0701 - 01/17 0700 In: 4388.3 [P.O.:1200; I.V.:2888.3; IV Piggyback:300] Out: 1580 [Urine:950; Drains:230; Blood:400] Intake/Output this shift: No intake/output data recorded.  Recent Labs    01/31/17 1024  HGB 11.6*   Recent Labs    01/31/17 1024  HCT 34.0*   Recent Labs    01/31/17 1024  NA 143  K 3.3*  GLUCOSE 81   No results for input(s): LABPT, INR in the last 72 hours.  EXAM General - Patient is Alert, Appropriate and Oriented Extremity - Neurologically intact Neurovascular intact Sensation intact distally Intact pulses distally Dorsiflexion/Plantar flexion intact No cellulitis present Compartment soft Dressing - dressing C/D/I Motor Function - intact, moving foot and toes well on exam.    Past Medical History:  Diagnosis Date  . Allergic rhinitis   . Anemia   . Anxiety   . Arthritis   . Asthma   . Atrophic vaginitis   . B12 deficiency   . Bronchitis, chronic ([place])   . Cervical disc disease   . Depression   . Foot ulcer ([place])   . GERD (gastroesophageal reflux disease)   . Hammer toe   .  Hyperlipidemia   . IBS (irritable bowel syndrome)   . Obesity   . Obstructive sleep apnea   . OSA (obstructive sleep apnea)   . Osteoarthrosis    Multi site  . Osteopenia   . Rheumatoid arthritis ([place])   . Shingles   . Thyroid nodule    MULTIPLE  . Tonsillitis    recurrent  . Umbilical hernia     Assessment/Plan: 1 Day Post-Op Procedure(s) (LRB): TOTAL HIP ARTHROPLASTY (Right) Active Problems:   Status post total replacement of hip  Estimated body mass index is 36.66 kg/m as calculated from the following:   Height as of this encounter: _0  (1.549 m).   Weight as of this encounter: 88 kg (194 lb). Advance diet Up with therapy D/C IV fluids Plan for discharge tomorrow Discharge to SNF  Labs: Potassium 3.3. Yesterday. We'll repeat met B DVT Prophylaxis - Lovenox, Foot Pumps and TED hose Weight-Bearing as tolerated to right leg D/C O2 and Pulse OX and try on Room Air Begin working on bowel movement  Marlina Cataldi R. [place] 02/01/2017, 7:30 AM

## 2017-02-01 NOTE — Progress Notes (Addendum)
PT Cancellation Note  Patient Details Name: Courtney Wilcox MRN: [id number] DOB: 1945/11/19   Cancelled Treatment:    Reason Eval/Treat Not Completed: Medical issues which prohibited therapy. Treatment initiated with pt supine in bed discussing options for session. Pt has mild pain complaint 3-4/10, but notes nausea that is more under control lying in bed. Heart rate found to be 142-144 beats per minute at rest in bed; checked multiple ways and nursing called, also spoke to face to face due to concern of change in vitals. Pt was not noted to be overtly anxious at time of attempt and over course of time therapist in room. Deferred activity/exercise at this time. Re attempt tomorrow. Daughter has questions regarding recommendation to skilled nursing facility; spoke with daughter about why PT recommendations were such, and informed daughter that pt/family ultimately were able to make decision for best discharge plan according to pt needs/desires/comfort level.    Scot Dock, PTA 02/01/2017, 3:41 PM

## 2017-02-01 NOTE — Progress Notes (Signed)
Social work Theatre manager met with patient and her daughter Morey Hummingbird this afternoon to follow up on SNF bed choice. Per daughter they are going home and refused SNF. RN case Freight forwarder notified.   [employer], LCSW [phone]

## 2017-02-01 NOTE — Progress Notes (Signed)
Initial Nutrition Assessment  DOCUMENTATION CODES:   Obesity unspecified  INTERVENTION:  Recommend Ensure Enlive po daily, each supplement provides 350 kcal and 20 grams of protein.  NUTRITION DIAGNOSIS:   Increased nutrient needs related to post-op healing as evidenced by estimated needs.  GOAL:   Patient will meet greater than or equal to 90% of their needs  MONITOR:   PO intake, Supplement acceptance, Labs, Weight trends, Skin, I & O's  REASON FOR ASSESSMENT:   Malnutrition Screening Tool    ASSESSMENT:   72 year old female with PMHx of arthritis, HLD, OA, GERD, IBS, vitamin B12 deficiency, anxiety, OSA, RA admitted with degenerative arthrosis of right hip now s/p right total hip arthroplasty 01/31/2017.   Met with patient and her daughter at bedside. Patient reports she has had poor appetite and intake PTA for a while. She reports it is due to her acid reflux. She has had to take a lot of NSAIDs in setting of her pain PTA and it has made her stomach feel raw. She is also having occasional N/V and bloating. Reports she is tolerating her diet well here. Noted in chart she finished 100% of her breakfast today. She is lactose-intolerant.  UBW 230-235 lbs. Could not find a weight that high in chart all the way back to 2014. She was 194 lbs on 07/12/2016. RD obtained bed scale weight of 223 lbs, so unsure whether current weight in chart is accurate.  Meal Completion: 100%  Medications reviewed and include: Xanax, vitamin D 4000 units daily, ferrous sulfate 325 mg BID, Reglan 10 mg TID, pantoprazole, potassium chloride 4 mEq daily, senna, NS @ 100 mL/hr.  Labs reviewed: Creatinine 1.1.  Patient does not meet criteria for malnutrition.  NUTRITION - FOCUSED PHYSICAL EXAM:    Most Recent Value  Orbital Region  No depletion  Upper Arm Region  No depletion  Thoracic and Lumbar Region  No depletion  Buccal Region  No depletion  Temple Region  No depletion  Clavicle Bone Region   No depletion  Clavicle and Acromion Bone Region  No depletion  Scapular Bone Region  No depletion  Dorsal Hand  No depletion  Patellar Region  No depletion  Anterior Thigh Region  No depletion  Posterior Calf Region  No depletion  Edema (RD Assessment)  None  Hair  Reviewed  Eyes  Reviewed  Mouth  Reviewed  Skin  Reviewed  Nails  Reviewed     Diet Order:  Diet regular Room service appropriate? Yes; Fluid consistency: Thin Diet - low sodium heart healthy  EDUCATION NEEDS:   No education needs have been identified at this time  Skin:  Skin Assessment: Skin Integrity Issues: Skin Integrity Issues:: Incisions Incisions: closed incisions right hip and left knee  Last BM:  01/31/2017  Height:   Ht Readings from Last 1 Encounters:  01/31/17 _0  (1.549 m)    Weight:   Wt Readings from Last 1 Encounters:  01/31/17 194 lb (88 kg)    Ideal Body Weight:  47.7 kg  BMI:  Body mass index is 36.66 kg/m.  Estimated Nutritional Needs:   Kcal:  9485-4627 (MSJ x 1.2-1.4)  Protein:  90-105 grams (1-1.2 grams/kg)  Fluid:  1.4-1.7 L/day (30-35 mL/kg IBW)  Willey Blade, MS, RD, LDN Office: [phone] Pager: [phone] After Hours/Weekend Pager: [phone]

## 2017-02-01 NOTE — Progress Notes (Signed)
OT Cancellation Note  Patient Details Name: Courtney Wilcox MRN: [id number] DOB: 07-31-1945   Cancelled Treatment:     Attempted to see patient this afternoon and patient busy with nursing, patient then having dietary consult.   Re attempted later and patient with increased pain and increased heart rate.  Will continue attempts next date for OT evaluation.  Amy T Lovett, OTR/L, CLT   Lovett,Amy 02/01/2017, 4:41 PM

## 2017-02-01 NOTE — Evaluation (Signed)
Physical Therapy Evaluation Patient Details Name: Courtney Wilcox MRN: [id number] DOB: March 13, 1945 Today's Date: 02/01/2017   History of Present Illness  Pt is a 72 yo F s/p elective R THA with a PMH that includes anxiety, asthma, B12 deficiency, cervical disk Dz, depression, foot ulcer, GERD, HLD, IBS, OSA, OA, and RA.     Clinical Impression  Pt presents with deficits in strength, transfers, mobility, gait, balance, and activity tolerance.  Pt required Mod A with all bed mobility tasks and Min A with transfers.  Pt able to take several steps this session with RW with Min A to prevent posterior LOB and was generally unsteady during all standing activities.  Pt required extensive cueing during session to maintain posterior hip precautions during functional mobility tasks.  Pt's SpO2 and HR on room air WNL throughout session.  Pt will benefit from PT services in a SNF setting upon discharge to safely address above deficits for decreased caregiver assistance and eventual return to PLOF.      Follow Up Recommendations SNF    Equipment Recommendations  None recommended by PT    Recommendations for Other Services       Precautions / Restrictions Precautions Precautions: Posterior Hip;Fall Precaution Booklet Issued: Yes (comment) Precaution Comments: Extensive posterior hip precaution education provided to pt and daughter Restrictions Weight Bearing Restrictions: Yes RLE Weight Bearing: Weight bearing as tolerated Other Position/Activity Restrictions: Hip Abd pillow in place in bed/chair      Mobility  Bed Mobility Overal bed mobility: Needs Assistance Bed Mobility: Supine to Sit;Sit to Supine     Supine to sit: Mod assist Sit to supine: Mod assist   General bed mobility comments: Mod A for BLEs in and out of bed with cues for maintaining hip precautions  Transfers Overall transfer level: Needs assistance Equipment used: Rolling walker (2 wheeled) Transfers: Sit to/from  Stand Sit to Stand: Min assist         General transfer comment: Extensive cueing for sequencing for maintaining posterior hip precautions with min instability upon standing  Ambulation/Gait Ambulation/Gait assistance: Min assist Ambulation Distance (Feet): 5 Feet Assistive device: Rolling walker (2 wheeled) Gait Pattern/deviations: Step-to pattern;Decreased step length - left;Decreased stance time - right   Gait velocity interpretation: <1.8 ft/sec, indicative of risk for recurrent falls General Gait Details: Posterior instability during amb with min A required to prevent LOB  Stairs Stairs: (Deferred)          Wheelchair Mobility    Modified Rankin (Stroke Patients Only)       Balance Overall balance assessment: Needs assistance Sitting-balance support: Feet unsupported;Feet supported;No upper extremity supported Sitting balance-Leahy Scale: Good     Standing balance support: Bilateral upper extremity supported Standing balance-Leahy Scale: Poor Standing balance comment: Posterior instability in standing                             Pertinent Vitals/Pain Pain Assessment: 0-10 Pain Score: 4  Pain Location: R hip Pain Descriptors / Indicators: Aching;Operative site guarding;Sore Pain Intervention(s): Premedicated before session;Limited activity within patient's tolerance;Monitored during session    Home Living Family/patient expects to be discharged to:: Private residence Living Arrangements: Spouse/significant other Available Help at Discharge: Family;Available 24 hours/day Type of Home: House Home Access: Stairs to enter Entrance Stairs-Rails: None Entrance Stairs-Number of Steps: 1 Home Layout: One level;Other (Comment)(Has one step inside home without rails) Home Equipment: Walker - 2 wheels;Walker - 4 wheels  Prior Function Level of Independence: Needs assistance   Gait / Transfers Assistance Needed: Mod Ind amb limited community  distances with RW; occasional min A required with bed mobility and transfers, no fall history  ADL's / Homemaking Assistance Needed: Ind with ADLs        Hand Dominance   Dominant Hand: Right    Extremity/Trunk Assessment   Upper Extremity Assessment Upper Extremity Assessment: Defer to OT evaluation    Lower Extremity Assessment Lower Extremity Assessment: Generalized weakness;RLE deficits/detail RLE: Unable to fully assess due to pain       Communication   Communication: No difficulties  Cognition Arousal/Alertness: Awake/alert Behavior During Therapy: WFL for tasks assessed/performed Overall Cognitive Status: Within Functional Limits for tasks assessed                                        General Comments      Exercises Total Joint Exercises Ankle Circles/Pumps: AROM;Both;5 reps;10 reps Quad Sets: Strengthening;Both;5 reps;10 reps Gluteal Sets: Strengthening;Both;5 reps;10 reps Heel Slides: AROM;Both;5 reps(Limited ROM on RLE to maintain hip precautions) Hip ABduction/ADduction: AAROM;Right;5 reps Straight Leg Raises: AAROM;Right;5 reps Long Arc Quad: AROM;Both;10 reps Knee Flexion: AROM;Both;10 reps Marching in Standing: AROM;Both;10 reps Other Exercises Other Exercises: Extensive posterior hip precaution education to pt and daughter Other Exercises: Right turn education to pt and daugther to avoid CKC R hip IR Other Exercises: Sit to/from stand transfer training with extensive cueing for hand placement and general sequencing to prevent excess R hip flexion   Assessment/Plan    PT Assessment Patient needs continued PT services  PT Problem List Decreased strength;Decreased activity tolerance;Decreased balance;Decreased mobility;Decreased safety awareness;Decreased knowledge of precautions       PT Treatment Interventions DME instruction;Gait training;Stair training;Functional mobility training;Neuromuscular re-education;Balance  training;Therapeutic exercise;Therapeutic activities;Patient/family education    PT Goals (Current goals can be found in the Care Plan section)  Acute Rehab PT Goals Patient Stated Goal: To walk better PT Goal Formulation: With patient Time For Goal Achievement: 02/14/17 Potential to Achieve Goals: Good    Frequency BID   Barriers to discharge        Co-evaluation               AM-PAC PT "6 Clicks" Daily Activity  Outcome Measure Difficulty turning over in bed (including adjusting bedclothes, sheets and blankets)?: Unable Difficulty moving from lying on back to sitting on the side of the bed? : Unable Difficulty sitting down on and standing up from a chair with arms (e.g., wheelchair, bedside commode, etc,.)?: Unable Help needed moving to and from a bed to chair (including a wheelchair)?: A Lot Help needed walking in hospital room?: A Lot Help needed climbing 3-5 steps with a railing? : Total 6 Click Score: 8    End of Session Equipment Utilized During Treatment: Gait belt Activity Tolerance: Patient tolerated treatment well Patient left: in bed;with call bell/phone within reach;with SCD's reapplied;Other (comment);with family/visitor present;with bed alarm set(Hip abd pillow in place, ice bag to R hip) Nurse Communication: Mobility status PT Visit Diagnosis: Other abnormalities of gait and mobility (R26.89);Muscle weakness (generalized) (M62.81)    Time: 5361-4431 PT Time Calculation (min) (ACUTE ONLY): 56 min   Charges:   PT Evaluation $PT Eval Low Complexity: 1 Low PT Treatments $Gait Training: 8-22 mins $Therapeutic Exercise: 8-22 mins $Therapeutic Activity: 8-22 mins   PT G Codes:  Denyce Robert Gurnie Duris PT, DPT 02/01/17, 1:28 PM

## 2017-02-01 NOTE — Clinical Social Work Note (Addendum)
Clinical Social Work Assessment  Patient Details  Name: Courtney Wilcox MRN: [id number] Date of Birth: 11-12-1945  Date of referral:  02/01/17               Reason for consult:  Facility Placement, Discharge Planning                Permission sought to share information with:  Chartered certified accountant granted to share information::  Yes, Verbal Permission Granted  Name::      Courtney Wilcox::   [place]  Relationship::     Contact Information:     Housing/Transportation Living arrangements for the past 2 months:  [place] of Information:  Patient, Adult Children Patient Interpreter Needed:  None Criminal Activity/Legal Involvement Pertinent to Current Situation/Hospitalization:  No - Comment as needed Significant Relationships:  Adult Children, Spouse Lives with:  Spouse Do you feel safe going back to the place where you live?  Yes Need for family participation in patient care:  Yes (Comment)  Care giving concerns: Patient lives in [place] with husband Courtney Wilcox [phone]).   Social Worker assessment / plan: Holiday representative (CSW) received SNF consult. PT is recommending SNF. Social work Theatre manager met with patient and daughter Courtney Wilcox [phone]) at bedside. Patient was laying in bed and alert and oriented x4. Social work Theatre manager introduced self and explained the role of the [place]. Patient allowed her daughter to speak on her behalf. Patient's daughter shared that patient lives in [place] with her husband Courtney Wilcox but she is patient's primary contact. Social work Theatre manager explained that PT is recommending SNF, the process, and that medicare requires a qualifying 3 night inpatient stay. Patient was admitted on 02/01/16. Patient's daughter verbalized her understanding and wants to take patient home, however will agree to SNF if patient does not progress. FL2 completed and faxed out.   Social work Theatre manager presented bed  offers and patient's daughter is still reviewing them. CSW and Social work Theatre manager will continue to follow up and assist.  Employment status:  Retired Forensic scientist:  Medicare, Managed Care PT Recommendations:  [place] / Referral to community resources:  [place]  Patient/Family's Response to care: Patient's daughter prefers for her to go home, but is agreeable to SNF if no progress is made.  Patient/Family's Understanding of and Emotional Response to Diagnosis, Current Treatment, and Prognosis: Patient and her daughter were both pleasant and thanked social work Theatre manager for her assistance.  Emotional Assessment Appearance:  Appears stated age Attitude/Demeanor/Rapport:    Affect (typically observed):  Accepting, Pleasant, Calm Orientation:  Oriented to Self, Oriented to Place, Oriented to  Time, Oriented to Situation Alcohol / Substance use:  Not Applicable Psych involvement (Current and /or in the community):  No (Comment)  Discharge Needs  Concerns to be addressed:  Care Coordination, Discharge Planning Concerns Readmission within the last 30 days:  No Current discharge risk:  Dependent with Mobility Barriers to Discharge:  Continued Medical Work up   Smith Mince, Student-Social Work 02/01/2017, 2:00 PM

## 2017-02-01 NOTE — Discharge Summary (Signed)
Physician Discharge Summary  Patient ID: Courtney Wilcox MRN: [id number] DOB/AGE: 1945/08/03 72 y.o.  Admit date: 01/31/2017 Discharge date: 02/03/2017  Admission Diagnoses:  PRIMARY OSTEOARTHRITIS OF RIGHT HIP   Discharge Diagnoses: Patient Active Problem List   Diagnosis Date Noted  . 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/2017  . 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  . Primary osteoarthritis of right hip 12/13/2016  . S/P total knee arthroplasty 07/12/2016  . Asthma, mild 02/19/2013  . Chest pain 02/19/2013  . Rheumatoid arthritis (HCC) 10/30/2012  . Shortness of breath 10/29/2012  . Spinal stenosis in cervical region 10/29/2012    Past Medical History:  Diagnosis Date  . Allergic rhinitis   . Anemia   . Anxiety   . Arthritis   . Asthma   . Atrophic vaginitis   . B12 deficiency   . Bronchitis, chronic (HCC)   . Cervical disc disease   . Depression   . Foot ulcer (HCC)   . GERD (gastroesophageal reflux disease)   . Hammer toe   . Hyperlipidemia   . IBS (irritable bowel syndrome)   . Obesity   . Obstructive sleep apnea   . OSA (obstructive sleep apnea)   . Osteoarthrosis    Multi site  . Osteopenia   . Rheumatoid arthritis (HCC)   . Shingles   . Thyroid nodule    MULTIPLE  . Tonsillitis    recurrent  . Umbilical hernia      Transfusion: No transfusions during this admission   Consultants (if any):   Discharged Condition: Improved  Hospital Course: Courtney Wilcox is an 72 y.o. female who was admitted 01/31/2017 with a diagnosis of degenerative arthrosis right hip and went to the operating room on 01/31/2017 and underwent the above named procedures. The patient did minimal ambulation on postop day 1, but postop day 2 she improved. She was able to ambulate 175 feet and was able to go to the bedside commode with minimal assistance. She was ready to go home with home health physical therapy. She did have occasional  elevated heart rate to around 147 and then it would drop down to 90. Her EKG on postop day 2 was normal sinus rhythm. She had a bowel movement. Her labs were stable. She was ready to go home with home physical therapy.   Surgeries:Procedure(s): TOTAL HIP ARTHROPLASTY on 01/31/2017  PRE-OPERATIVE DIAGNOSIS: Degenerative arthrosis of the right hip, primary  POST-OPERATIVE DIAGNOSIS:  Same  PROCEDURE:  Right total hip arthroplasty  SURGEON:  Jena Gauss. M.D.  ASSISTANT:  Van Clines, PA (present and scrubbed throughout the case, critical for assistance with exposure, retraction, instrumentation, and closure)  ANESTHESIA: general  ESTIMATED BLOOD LOSS: 400 mL  FLUIDS REPLACED: 1900 mL of crystalloid  DRAINS: 2 medium drains to a Hemovac reservoir  IMPLANTS UTILIZED: DePuy 13.5 mm all stature AML femoral stem, 50 mm OD Pinnacle 100 acetabular component, neutral Pinnacle Marathon polyethylene insert, and a 32 mm CoCr +1 mm hip ball  INDICATIONS FOR SURGERY: Courtney Wilcox is a 72 y.o. year old female with a long history of progressive hip and groin  pain. X-rays demonstrated severe degenerative changes. The patient had not seen any significant improvement despite conservative nonsurgical intervention. After discussion of the risks and benefits of surgical intervention, the patient expressed understanding of the risks benefits and agree with plans for total hip arthroplasty.   The risks, benefits, and alternatives were discussed at length including but  not limited to the risks of infection, bleeding, nerve injury, stiffness, blood clots, the need for revision surgery, limb length inequality, dislocation, cardiopulmonary complications, among others, and they were willing to proceed.   Patient tolerated the surgery well. No complications .Patient was taken to PACU where she was stabilized and then transferred to the orthopedic floor.  Patient started on Lovenox 30 mg q 12  hrs. Foot pumps applied bilaterally at 80 mm hgb. Heels elevated off bed with rolled towels. No evidence of DVT. Calves non tender. Negative [place]. Physical therapy started on day #1 for gait training and transfer with OT starting on  day #1 for ADL and assisted devices. Patient has done well with therapy. Ambulated 175 feet upon being discharged.  Patient's IV And Foley were discontinued on day #1 with Hemovac being discontinued on day #2. Dressing was changed on day 2 prior to patient being discharged.  EKG on postoperative day 2 was normal sinus rhythm.  She had occasional tachycardia on exam.   She was given perioperative antibiotics:  Anti-infectives (From admission, onward)   Start     Dose/Rate Route Frequency Ordered Stop   01/31/17 1700  ceFAZolin (ANCEF) IVPB 2g/100 mL premix     2 g 200 mL/hr over 30 Minutes Intravenous Every 6 hours 01/31/17 1626 02/01/17 1344   01/31/17 1019  ceFAZolin (ANCEF) 2-4 GM/100ML-% IVPB    Comments:  Courtney Wilcox, Courtney Wilcox   : cabinet override      01/31/17 1019 01/31/17 2229   01/31/17 0600  ceFAZolin (ANCEF) IVPB 2g/100 mL premix  Status:  Discontinued     2 g 200 mL/hr over 30 Minutes Intravenous On call to O.R. 01/30/17 2212 01/31/17 2423    .  She was fitted with AV 1 compression foot pump devices, instructed on heel pumps, early ambulation, and fitted with TED stockings bilaterally for DVT prophylaxis.  She benefited maximally from the hospital stay and there were no complications.    Recent vital signs:  Vitals:   02/03/17 0358 02/03/17 0738  BP: 140/90 127/80  Pulse: (!) 147 (!) 155  Resp: 16 19  Temp: 98.3 F (36.8 C) 98 F (36.7 C)  SpO2: 95% 97%    Recent laboratory studies:  Lab Results  Component Value Date   HGB 11.6 (L) 01/31/2017   HGB 11.6 (L) 01/25/2017   HGB 10.6 (L) 07/14/2016   Lab Results  Component Value Date   WBC 4.8 01/25/2017   PLT 152 01/25/2017   Lab Results  Component Value Date   INR 0.97 01/25/2017    Lab Results  Component Value Date   NA 138 02/01/2017   K 3.8 02/01/2017   CL 104 02/01/2017   CO2 26 02/01/2017   BUN 15 02/01/2017   CREATININE 1.10 (H) 02/01/2017   GLUCOSE 104 (H) 02/01/2017    Discharge Medications:   Allergies as of 02/03/2017      Reactions   Lactose Other (See Comments), Diarrhea   Other Other (See Comments)   Pt ONLY tolerates SLOW-FE (slow released iron)---upsets IBS   Biaxin [clarithromycin] Other (See Comments)   GI upset   Hydrocodone-acetaminophen Itching   Tolerates acetaminophen    Oxycodone Itching   Sulfasalazine Diarrhea      Medication List    TAKE these medications   acetaminophen 500 MG tablet Commonly known as:  TYLENOL Take 1,000 mg by mouth 3 (three) times daily as needed for mild pain.   ALPRAZolam 0.5 MG tablet Commonly known as:  XANAX Take 0.5 mg by mouth at bedtime.   budesonide-formoterol 160-4.5 MCG/ACT inhaler Commonly known as:  SYMBICORT Inhale 2 puffs into the lungs 2 (two) times daily as needed.   citalopram 20 MG tablet Commonly known as:  CELEXA Take 20 mg by mouth every evening.   E-Z SPACER inhaler Use as instructed   enoxaparin 30 MG/0.3ML injection Commonly known as:  LOVENOX Inject 0.3 mLs (30 mg total) into the skin every 12 (twelve) hours for 14 days.   fluticasone 50 MCG/ACT nasal spray Commonly known as:  FLONASE Place 1-2 sprays into the nose daily.   hydroxychloroquine 200 MG tablet Commonly known as:  PLAQUENIL Take 1 tablet (200 mg total) by mouth daily. What changed:  when to take this   ibuprofen 200 MG tablet Commonly known as:  ADVIL,MOTRIN Take 400 mg by mouth every 6 (six) hours as needed (for pain.).   meloxicam 7.5 MG tablet Commonly known as:  MOBIC Take 7.5-15 mg by mouth See admin instructions. Take 1 tablet (7.5 mg) twice daily or 2 tablets (15 mg) in the evening.   montelukast 10 MG tablet Commonly known as:  SINGULAIR Take 10 mg by mouth daily as needed (for  allergies.).   naproxen sodium 220 MG tablet Commonly known as:  ALEVE Take 220 mg by mouth 2 (two) times daily as needed (for pain.).   nystatin 100000 UNIT/ML suspension Commonly known as:  MYCOSTATIN Take 1 mL by mouth 4 (four) times daily as needed. For thrush/mouth sores.   omeprazole 20 MG capsule Commonly known as:  PRILOSEC Take 20 mg by mouth daily before breakfast.   Potassium 99 MG Tabs Take 99 mg by mouth daily with lunch.   pseudoephedrine 30 MG tablet Commonly known as:  SUDAFED Take 30 mg by mouth every 4 (four) hours as needed for congestion.   ranitidine 150 MG tablet Commonly known as:  ZANTAC Take 150 mg by mouth 2 (two) times daily before a meal. Before lunch and before supper.   Simethicone 180 MG Caps Take 1-2 capsules by mouth as needed.   tapentadol 50 MG tablet Commonly known as:  NUCYNTA Take 1 tablet (50 mg total) by mouth every 4 (four) hours as needed for moderate pain.   traMADol 50 MG tablet Commonly known as:  ULTRAM Take 1-2 tablets (50-100 mg total) by mouth every 4 (four) hours as needed for moderate pain. What changed:    how much to take  when to take this  reasons to take this   Vitamin D 2000 units tablet Take 4,000 Units by mouth daily.            Durable Medical Equipment  (From admission, onward)        Start     Ordered   01/31/17 1626  DME Walker rolling  Once    Question:  Patient needs a walker to treat with the following condition  Answer:  S/P total hip arthroplasty   01/31/17 1626   01/31/17 1626  DME Bedside commode  Once    Question:  Patient needs a bedside commode to treat with the following condition  Answer:  S/P total hip arthroplasty   01/31/17 1626      Diagnostic Studies: Dg Hip Port Unilat With Pelvis 1v Right  Result Date: 01/31/2017 CLINICAL DATA:  Right hip replacement EXAM: DG HIP (WITH OR WITHOUT PELVIS) 1V PORT RIGHT COMPARISON:  None. FINDINGS: Interval right total hip arthroplasty  without failure complication. No fracture  or dislocation. Postsurgical changes in soft tissues surrounding the right hip. IMPRESSION: Interval right total hip arthroplasty. Electronically Signed   By: Elige Ko   On: 01/31/2017 15:07    Disposition: 01-Home or Self Care  Discharge Instructions    Diet - low sodium heart healthy   Complete by:  As directed    Increase activity slowly   Complete by:  As directed       Follow-up Information    Hooten, Illene Labrador, MD Follow up on 03/15/2017.   Specialty:  Orthopedic Surgery Why:  at 11:15am Contact information: [address] [facility] [place] [address] [phone]            Signed: Lenard Forth, Jourdin Connors 02/03/2017, 7:41 AM

## 2017-02-02 DIAGNOSIS — R000 Tachycardia, unspecified: Secondary | ICD-10-CM

## 2017-02-02 LAB — SURGICAL PATHOLOGY

## 2017-02-02 NOTE — Care Management (Signed)
RNCM spoke with patient. She hopes to be able to go home however she wants to see how she does with PT today.  She has used [facility] in the past and would agree to them again if she returns home. She has a front-wheeled walker available for use at home. She uses [facility] [phone] for medications. RNCM will follow.

## 2017-02-02 NOTE — Discharge Summary (Signed)
Physician Discharge Summary  Subjective: 2 Days Post-Op Procedure(s) (LRB): TOTAL HIP ARTHROPLASTY (Right) Patient reports pain as moderate.   Patient seen in rounds with Dr. Ernest Pine. Patient is well, and has had no acute complaints or problems Patient is ready to go to rehabilitation for physical therapy  Physician Discharge Summary  Patient ID: Courtney Wilcox MRN: [id number] DOB/AGE: 70-Aug-1947 70 y.o.  Admit date: 01/31/2017 Discharge date: 02/02/2017  Admission Diagnoses:  Discharge Diagnoses:  Active Problems:   Status post total replacement of hip   Discharged Condition: fair  Hospital Course: The patient is postop day 2 from a right total hip replacement. Postop day 1 she had nausea and vomiting and was unable to do much physical therapy. She had a potassium level of 3.3. She did improve with medication. Her pain management is becoming more controlled. On postop day 2 her drain was removed. Her potassium level was back up to 3.8. The patient was to do physical therapy before being discharged to rehabilitation.  Treatments: surgery:  Right total hip arthroplasty  SURGEON:  Jena Gauss. M.D.  ASSISTANT:  Van Clines, PA (present and scrubbed throughout the case, critical for assistance with exposure, retraction, instrumentation, and closure)  ANESTHESIA: general  ESTIMATED BLOOD LOSS: 400 mL  FLUIDS REPLACED: 1900 mL of crystalloid  DRAINS: 2 medium drains to a Hemovac reservoir  IMPLANTS UTILIZED: DePuy 13.5 mm all stature AML femoral stem, 50 mm OD Pinnacle 100 acetabular component, neutral Pinnacle Marathon polyethylene insert, and a 32 mm CoCr +1 mm hip ball    Discharge Exam: Blood pressure (!) 143/81, pulse (!) 137, temperature 97.9 F (36.6 C), temperature source Oral, resp. rate 19, height 5\' 1"  (1.549 m), weight 88 kg (194 lb), SpO2 95 %.   Disposition: 01-Home or Self Care  Discharge Instructions    Diet - low sodium heart healthy    Complete by:  As directed    Increase activity slowly   Complete by:  As directed      Allergies as of 02/02/2017      Reactions   Lactose Other (See Comments), Diarrhea   Other Other (See Comments)   Pt ONLY tolerates SLOW-FE (slow released iron)---upsets IBS   Biaxin [clarithromycin] Other (See Comments)   GI upset   Hydrocodone-acetaminophen Itching   Tolerates acetaminophen    Oxycodone Itching   Sulfasalazine Diarrhea      Medication List    TAKE these medications   acetaminophen 500 MG tablet Commonly known as:  TYLENOL Take 1,000 mg by mouth 3 (three) times daily as needed for mild pain.   ALPRAZolam 0.5 MG tablet Commonly known as:  XANAX Take 0.5 mg by mouth at bedtime.   budesonide-formoterol 160-4.5 MCG/ACT inhaler Commonly known as:  SYMBICORT Inhale 2 puffs into the lungs 2 (two) times daily as needed.   citalopram 20 MG tablet Commonly known as:  CELEXA Take 20 mg by mouth every evening.   E-Z SPACER inhaler Use as instructed   enoxaparin 30 MG/0.3ML injection Commonly known as:  LOVENOX Inject 0.3 mLs (30 mg total) into the skin every 12 (twelve) hours for 14 days. Start taking on:  02/03/2017   fluticasone 50 MCG/ACT nasal spray Commonly known as:  FLONASE Place 1-2 sprays into the nose daily.   hydroxychloroquine 200 MG tablet Commonly known as:  PLAQUENIL Take 1 tablet (200 mg total) by mouth daily. What changed:  when to take this   ibuprofen 200 MG tablet Commonly known as:  ADVIL,MOTRIN Take 400 mg by mouth every 6 (six) hours as needed (for pain.).   meloxicam 7.5 MG tablet Commonly known as:  MOBIC Take 7.5-15 mg by mouth See admin instructions. Take 1 tablet (7.5 mg) twice daily or 2 tablets (15 mg) in the evening.   montelukast 10 MG tablet Commonly known as:  SINGULAIR Take 10 mg by mouth daily as needed (for allergies.).   naproxen sodium 220 MG tablet Commonly known as:  ALEVE Take 220 mg by mouth 2 (two) times daily as  needed (for pain.).   nystatin 100000 UNIT/ML suspension Commonly known as:  MYCOSTATIN Take 1 mL by mouth 4 (four) times daily as needed. For thrush/mouth sores.   omeprazole 20 MG capsule Commonly known as:  PRILOSEC Take 20 mg by mouth daily before breakfast.   Potassium 99 MG Tabs Take 99 mg by mouth daily with lunch.   pseudoephedrine 30 MG tablet Commonly known as:  SUDAFED Take 30 mg by mouth every 4 (four) hours as needed for congestion.   ranitidine 150 MG tablet Commonly known as:  ZANTAC Take 150 mg by mouth 2 (two) times daily before a meal. Before lunch and before supper.   Simethicone 180 MG Caps Take 1-2 capsules by mouth as needed.   tapentadol 50 MG tablet Commonly known as:  NUCYNTA Take 1 tablet (50 mg total) by mouth every 4 (four) hours as needed for moderate pain.   traMADol 50 MG tablet Commonly known as:  ULTRAM Take 1-2 tablets (50-100 mg total) by mouth every 4 (four) hours as needed for moderate pain. What changed:    how much to take  when to take this  reasons to take this   Vitamin D 2000 units tablet Take 4,000 Units by mouth daily.            Durable Medical Equipment  (From admission, onward)        Start     Ordered   01/31/17 1626  DME Walker rolling  Once    Question:  Patient needs a walker to treat with the following condition  Answer:  S/P total hip arthroplasty   01/31/17 1626   01/31/17 1626  DME Bedside commode  Once    Question:  Patient needs a bedside commode to treat with the following condition  Answer:  S/P total hip arthroplasty   01/31/17 1626     Follow-up Information    Hooten, Illene Labrador, MD Follow up on 03/15/2017.   Specialty:  Orthopedic Surgery Why:  at 11:15am Contact information: [address] [facility] [place] [address] [phone]           Signed: Lenard Forth, Zayda Angell 02/02/2017, 7:39 AM   Objective: Vital signs in last 24 hours: Temp:  [97.9 F (36.6 C)-98.8 F  (37.1 C)] 97.9 F (36.6 C) (01/18 0732) Pulse Rate:  [70-144] 137 (01/18 0732) Resp:  [16-20] 19 (01/18 0732) BP: (141-162)/(62-81) 143/81 (01/18 0732) SpO2:  [92 %-100 %] 95 % (01/18 0732)  Intake/Output from previous day:  Intake/Output Summary (Last 24 hours) at 02/02/2017 0739 Last data filed at 02/01/2017 2256 Gross per 24 hour  Intake 1120 ml  Output 430 ml  Net 690 ml    Intake/Output this shift: No intake/output data recorded.  Labs: Recent Labs    01/31/17 1024  HGB 11.6*   Recent Labs    01/31/17 1024  HCT 34.0*   Recent Labs    01/31/17 1024 02/01/17 0804  NA 143 138  K 3.3* 3.8  CL  --  104  CO2  --  26  BUN  --  15  CREATININE  --  1.10*  GLUCOSE 81 104*  CALCIUM  --  8.6*   No results for input(s): LABPT, INR in the last 72 hours.  EXAM: General - Patient is Alert and Oriented Extremity - Neurovascular intact Dorsiflexion/Plantar flexion intact Compartment soft Incision - clean, dry, no drainage, with the Hemovac removed. Motor Function -  plantarflexion and dorsiflexion are intact.  Assessment/Plan: 2 Days Post-Op Procedure(s) (LRB): TOTAL HIP ARTHROPLASTY (Right) Procedure(s) (LRB): TOTAL HIP ARTHROPLASTY (Right) Past Medical History:  Diagnosis Date  . Allergic rhinitis   . Anemia   . Anxiety   . Arthritis   . Asthma   . Atrophic vaginitis   . B12 deficiency   . Bronchitis, chronic (HCC)   . Cervical disc disease   . Depression   . Foot ulcer (HCC)   . GERD (gastroesophageal reflux disease)   . Hammer toe   . Hyperlipidemia   . IBS (irritable bowel syndrome)   . Obesity   . Obstructive sleep apnea   . OSA (obstructive sleep apnea)   . Osteoarthrosis    Multi site  . Osteopenia   . Rheumatoid arthritis (HCC)   . Shingles   . Thyroid nodule    MULTIPLE  . Tonsillitis    recurrent  . Umbilical hernia    Active Problems:   Status post total replacement of hip  Estimated body mass index is 36.66 kg/m as calculated  from the following:   Height as of this encounter: 5\' 1"  (1.549 m).   Weight as of this encounter: 88 kg (194 lb). Advance diet Up with therapy Discharge to SNF Diet - Regular diet Follow up - in 6 weeks Activity - WBAT Disposition - Rehab Condition Upon Discharge - Stable DVT Prophylaxis - Lovenox and TED hose  , PA-C Orthopaedic Surgery 02/02/2017, 7:39 AM

## 2017-02-02 NOTE — Progress Notes (Addendum)
   Subjective: 2 Days Post-Op Procedure(s) (LRB): TOTAL HIP ARTHROPLASTY (Right) Patient reports pain as 5 on 0-10 scale.  Patient doing well this morning. Patient is well, and has had no acute complaints or problems We will continue therapy today.  Plan is to go home versus Rehab after hospital stay. Probable Saturday no nausea and no vomiting Patient denies any chest pains or shortness of breath.  Objective: Vital signs in last 24 hours: Temp:  [97.9 F (36.6 C)-98.8 F (37.1 C)] 97.9 F (36.6 C) (01/18 0732) Pulse Rate:  [70-144] 137 (01/18 0732) Resp:  [16-20] 19 (01/18 0732) BP: (141-162)/(62-81) 143/81 (01/18 0732) SpO2:  [92 %-100 %] 95 % (01/18 0732) well approximated incision Heels are non tender and elevated off the bed using rolled towels Intake/Output from previous day: 01/17 0701 - 01/18 0700 In: 1120 [P.O.:240; I.V.:880] Out: 430 [Urine:350; Drains:80] Intake/Output this shift: No intake/output data recorded.  Recent Labs    01/31/17 1024  HGB 11.6*   Recent Labs    01/31/17 1024  HCT 34.0*   Recent Labs    01/31/17 1024 02/01/17 0804  NA 143 138  K 3.3* 3.8  CL  --  104  CO2  --  26  BUN  --  15  CREATININE  --  1.10*  GLUCOSE 81 104*  CALCIUM  --  8.6*   No results for input(s): LABPT, INR in the last 72 hours.  EXAM General - Patient is Alert, Appropriate and Oriented Extremity - Neurologically intact Neurovascular intact Sensation intact distally Intact pulses distally Dorsiflexion/Plantar flexion intact No cellulitis present Compartment soft Dressing - dressing C/D/I. Hemovac removed. Motor Function - intact, moving foot and toes well on exam.    Past Medical History:  Diagnosis Date  . Allergic rhinitis   . Anemia   . Anxiety   . Arthritis   . Asthma   . Atrophic vaginitis   . B12 deficiency   . Bronchitis, chronic (HCC)   . Cervical disc disease   . Depression   . Foot ulcer (HCC)   . GERD (gastroesophageal reflux  disease)   . Hammer toe   . Hyperlipidemia   . IBS (irritable bowel syndrome)   . Obesity   . Obstructive sleep apnea   . OSA (obstructive sleep apnea)   . Osteoarthrosis    Multi site  . Osteopenia   . Rheumatoid arthritis (HCC)   . Shingles   . Thyroid nodule    MULTIPLE  . Tonsillitis    recurrent  . Umbilical hernia     Assessment/Plan: 2 Days Post-Op Procedure(s) (LRB): TOTAL HIP ARTHROPLASTY (Right) Active Problems:   Status post total replacement of hip  Estimated body mass index is 36.66 kg/m as calculated from the following:   Height as of this encounter: 5\' 1"  (1.549 m).   Weight as of this encounter: 88 kg (194 lb). Advance diet. Continue physical therapy. Discharge to home versus rehabilitation tomorrow. Patient had a bowel movement.  Labs: Potassium 3.8 DVT Prophylaxis - Lovenox, Foot Pumps and TED hose Weight-Bearing as tolerated to right leg D/C O2 and Pulse OX and try on Room Air   PA-C [facility] Orthopaedics 02/02/2017, 7:34 AM

## 2017-02-02 NOTE — Care Management (Signed)
Courtney Wilcox with [facility] is following for home health services. PT is currently recommending HHPT. I have reached out to [facility] [phone] regarding cost of Lovenox- $156.89. I have notified patient's granddaughter.

## 2017-02-02 NOTE — Evaluation (Signed)
Occupational Therapy Evaluation Patient Details Name: Courtney Wilcox MRN: [id number] DOB: 02-23-1945 Today's Date: 02/02/2017    History of Present Illness Pt is a 72 yo F s/p elective R THA with a PMH that includes anxiety, asthma, B12 deficiency, cervical disk Dz, depression, foot ulcer, GERD, HLD, IBS, OSA, OA, and RA.    Clinical Impression   Patient seen for OT evaluation this date.  Patient lives at home with her husband and has family support from her daughters as needed.  She is hoping to go home if possible.  She was previously independent with all tasks including driving.  She presents with muscle weakness, decreased transfers/mobility, decreased ability to perform self care tasks and now has posterior hip precautions to adhere to while performing self care.  Patient is able to state 2/3 precautions.  Patient would benefit from skilled OT to maximize safety and independence with self care tasks and would benefit from [facility] upon discharge.      Follow Up Recommendations  Home health OT    Equipment Recommendations       Recommendations for Other Services       Precautions / Restrictions Precautions Precautions: Posterior Hip;Fall Restrictions Weight Bearing Restrictions: Yes RLE Weight Bearing: Weight bearing as tolerated Other Position/Activity Restrictions: Hip Abd pillow in place in bed/chair      Mobility Bed Mobility Overal bed mobility: Needs Assistance Bed Mobility: Supine to Sit;Sit to Supine     Supine to sit: Min assist Sit to supine: Min assist      Transfers Overall transfer level: Needs assistance Equipment used: Rolling walker (2 wheeled) Transfers: Sit to/from Stand Sit to Stand: Min guard              Balance Overall balance assessment: Needs assistance Sitting-balance support: Feet unsupported;Feet supported;No upper extremity supported Sitting balance-Leahy Scale: Good     Standing balance support: Bilateral upper extremity  supported Standing balance-Leahy Scale: Good                             ADL either performed or assessed with clinical judgement   ADL Overall ADL's : Needs assistance/impaired Eating/Feeding: Modified independent   Grooming: Standing;Min guard   Upper Body Bathing: Modified independent   Lower Body Bathing: Maximal assistance;Adhering to hip precautions Lower Body Bathing Details (indicate cue type and reason): patient will require assistance due to hip precautions Upper Body Dressing : Modified independent   Lower Body Dressing: Maximal assistance Lower Body Dressing Details (indicate cue type and reason): Patient instructed on AE for lower body dressing and able to complete with min assist.  Otherwise she will require assistance due to hip precautions.  Toilet Transfer: Lawyer and Hygiene: Min guard       Functional mobility during ADLs: Min guard       Vision Baseline Vision/History: Wears glasses Wears Glasses: At all times       Perception     Praxis      Pertinent Vitals/Pain Pain Assessment: 0-10 Pain Score: 4  Pain Location: R hip Pain Descriptors / Indicators: Aching;Operative site guarding;Sore Pain Intervention(s): Limited activity within patient's tolerance;Monitored during session;Repositioned     Hand Dominance Right   Extremity/Trunk Assessment Upper Extremity Assessment Upper Extremity Assessment: Generalized weakness   Lower Extremity Assessment Lower Extremity Assessment: Generalized weakness       Communication Communication Communication: No difficulties   Cognition Arousal/Alertness: Awake/alert Behavior  During Therapy: WFL for tasks assessed/performed Overall Cognitive Status: Within Functional Limits for tasks assessed                                     General Comments       Exercises     Shoulder Instructions      Home Living Family/patient expects to  be discharged to:: Private residence Living Arrangements: Spouse/significant other Available Help at Discharge: Family;Available 24 hours/day Type of Home: House Home Access: Stairs to enter [employer] of Steps: 1 Entrance Stairs-Rails: None Home Layout: One level;Other (Comment)     Bathroom Shower/Tub: Tub/shower unit;Curtain   Firefighter: Standard     Home Equipment: Environmental consultant - 2 wheels;Walker - 4 wheels          Prior Functioning/Environment Level of Independence: Needs assistance  Gait / Transfers Assistance Needed: Mod Ind amb limited community distances with RW; occasional min A required with bed mobility and transfers, no fall history ADL's / Homemaking Assistance Needed: Ind with ADLs            OT Problem List: Decreased strength;Decreased knowledge of use of DME or AE;Decreased knowledge of precautions;Pain      OT Treatment/Interventions: Self-care/ADL training;DME and/or AE instruction;Therapeutic activities;Therapeutic exercise;Patient/family education    OT Goals(Current goals can be found in the care plan section) Acute Rehab OT Goals Patient Stated Goal: to be as independent as I can be OT Goal Formulation: With patient Time For Goal Achievement: 02/09/17 Potential to Achieve Goals: Good ADL Goals Pt Will Perform Lower Body Dressing: (P) with modified independence  OT Frequency: Min 2X/week   Barriers to D/C:            Co-evaluation              AM-PAC PT "6 Clicks" Daily Activity     Outcome Measure                 End of Session Equipment Utilized During Treatment: Gait belt;Rolling walker  Activity Tolerance: Patient tolerated treatment well Patient left: in bed;with call bell/phone within reach;with bed alarm set;with nursing/sitter in room  OT Visit Diagnosis: Unsteadiness on feet (R26.81);Muscle weakness (generalized) (M62.81);Repeated falls (R29.6);Pain Pain - Right/Left: Right Pain - part of body: Hip                 Time: 0828-0900 OT Time Calculation (min): 32 min Charges:  OT General Charges $OT Visit: 1 Visit OT Evaluation $OT Eval Low Complexity: 1 Low OT Treatments $Self Care/Home Management : 8-22 mins G-Codes:     Amy T Lovett, OTR/L, CLT   Lovett,Amy 02/02/2017, 10:13 AM

## 2017-02-02 NOTE — Progress Notes (Signed)
POD 1. Pt. Alert and oriented. VSS. Pain controlled with PO pain meds. Pt. Up ambulating to bathroom with walker. Steady gait. Neurochecks WDL. IS at bedside and pt. Has been encouraged to use it. Resting quietly. Will continue to monitor.

## 2017-02-02 NOTE — Progress Notes (Signed)
Physical Therapy Treatment Patient Details Name: Courtney Wilcox MRN: [id number] DOB: [birth year] Today's Date: 02/02/2017    History of Present Illness Pt is a 72 yo F s/p elective R THA with a PMH that includes anxiety, asthma, B12 deficiency, cervical disk Dz, depression, foot ulcer, GERD, HLD, IBS, OSA, OA, and RA.     PT Comments    Pt continues to improve with strength and endurance and was able to ambulate 175' using RW and supervision assist for the second time today.  Pt with good carry over from morning session and is more confident with her moements.  Pt tolerated therex well and did not have any increased c/o pain.  Discussed progression of PT goals and expectations of 1-step training next session.      Follow Up Recommendations  Home health PT     Equipment Recommendations  Rolling walker with 5" wheels    Recommendations for Other Services       Precautions / Restrictions Precautions Precautions: Posterior Hip;Fall Precaution Booklet Issued: Yes (comment) Precaution Comments: Reviewed all Posterior hip precautions with daughter present with demonstration and booklet review. Restrictions RLE Weight Bearing: Weight bearing as tolerated Other Position/Activity Restrictions: Hip Abd pillow in place in bed/chair    Mobility  Bed Mobility Overal bed mobility: Needs Assistance Bed Mobility: Sit to Supine       Sit to supine: Supervision   General bed mobility comments: HOB flat entering from R side, verbal cues for sequencing, some difficulty scooting hips to middle of bed but able to accomplish with repeated trials.  Transfers Overall transfer level: Needs assistance Equipment used: Rolling walker (2 wheeled) Transfers: Sit to/from Stand Sit to Stand: Supervision         General transfer comment: Pt able to rise while maintaining hip precautions but does not consistently extend surgical leg prio to transfering  Ambulation/Gait Ambulation/Gait  assistance: Supervision Ambulation Distance (Feet): 175 Feet Assistive device: Rolling walker (2 wheeled) Gait Pattern/deviations: Step-through pattern;Decreased stance time - right     General Gait Details: Short step through step pattern with consistent gait speed and no rest breaks or balance deviations   Stairs     Wheelchair Mobility    Modified Rankin (Stroke Patients Only)       Balance            Cognition Arousal/Alertness: Awake/alert Behavior During Therapy: WFL for tasks assessed/performed Overall Cognitive Status: Within Functional Limits for tasks assessed        Exercises Total Joint Exercises Ankle Circles/Pumps: AROM;Both;10 reps Quad Sets: Strengthening;Both;10 reps Gluteal Sets: Strengthening;Both;10 reps Heel Slides: AROM;Right;10 reps Hip ABduction/ADduction: AROM;Right;10 reps Straight Leg Raises: AAROM;Right;10 reps Other Exercises Other Exercises: education: pillow between legs in bed    General Comments General comments (skin integrity, edema, etc.): surgical dressing intact      Pertinent Vitals/Pain      Home Living         Prior Function         PT Goals (current goals can now be found in the care plan section) Acute Rehab PT Goals Patient Stated Goal: to be as independent as I can be PT Goal Formulation: With patient Time For Goal Achievement: 02/14/17 Potential to Achieve Goals: Good Progress towards PT goals: Progressing toward goals    Frequency    BID      PT Plan Current plan remains appropriate    Co-evaluation         AM-PAC PT "6 Clicks" Daily  Activity  Outcome Measure  Difficulty turning over in bed (including adjusting bedclothes, sheets and blankets)?: None Difficulty moving from lying on back to sitting on the side of the bed? : None Difficulty sitting down on and standing up from a chair with arms (e.g., wheelchair, bedside commode, etc,.)?: None Help needed moving to and from a bed to chair  (including a wheelchair)?: A Little Help needed walking in hospital room?: None Help needed climbing 3-5 steps with a railing? : A Little 6 Click Score: 22    End of Session   Activity Tolerance: Patient tolerated treatment well Patient left: in bed;with call bell/phone within reach;with family/visitor present Nurse Communication: Mobility status PT Visit Diagnosis: Other abnormalities of gait and mobility (R26.89);Muscle weakness (generalized) (M62.81)     Time: 5176-1607 PT Time Calculation (min) (ACUTE ONLY): 27 min  Charges:  $Gait Training: 8-22 mins $Therapeutic Exercise: 8-22 mins                    G Codes:       Colonel Krauser A Tavish Gettis, PT 02/02/2017, 4:31 PM

## 2017-02-02 NOTE — Progress Notes (Signed)
Clinical Social Worker (CSW) met with patient and her daughter Morey Hummingbird to discuss D/C plan. Per patient she did much better with PT today and made it around the nurses station. Per patient she feels comfortable going home. Daughter is in agreement with patient going home. CSW will continue to follow and assist as needed.   [employer], LCSW [phone]

## 2017-02-02 NOTE — Progress Notes (Signed)
Physical Therapy Treatment Patient Details Name: Courtney Wilcox MRN: [id number] DOB: Mar 19, 1945 Today's Date: 02/02/2017    History of Present Illness Pt is a 72 yo F s/p elective R THA with a PMH that includes anxiety, asthma, B12 deficiency, cervical disk Dz, depression, foot ulcer, GERD, HLD, IBS, OSA, OA, and RA.     PT Comments    Pt with improved tolerance to mobility during PT session this date.  Pt able to transfer supine to stand with supervision assist and cues for maintaining hip precautions.  Pt without balance deviations this date and able to increase gait distance to ~200 feet with RW and supervision assist.  Pain controlled at 4/5 at end of session. Discharge recs updated to reflect progress.  Cont with POC.   Follow Up Recommendations  Home health PT     Equipment Recommendations  Rolling walker with 5" wheels(wants new walker, current walker is ~70 years old)    Recommendations for Other Services       Precautions / Restrictions Precautions Precautions: Posterior Hip;Fall Precaution Booklet Issued: Yes (comment) Precaution Comments: Reviewed all Posterior hip precautions with daughter present with demonstration and booklet review. Required Braces or Orthoses: (ABD pillow) Restrictions Weight Bearing Restrictions: Yes RLE Weight Bearing: Weight bearing as tolerated Other Position/Activity Restrictions: Hip Abd pillow in place in bed/chair    Mobility  Bed Mobility Overal bed mobility: Needs Assistance Bed Mobility: Supine to Sit     Supine to sit: Supervision Sit to supine: Min assist   General bed mobility comments: HOB bed elevated, able to move legs off bed and rotate hips 90 degrees, verbal cues for using hands behind hips to scoot forward with emphasis on maintaining hip precuations.  Transfers Overall transfer level: Needs assistance Equipment used: Rolling walker (2 wheeled) Transfers: Sit to/from Stand Sit to Stand: Supervision          General transfer comment: Verbal cues to extend R LE before standing, good carry over of hand placement.  Ambulation/Gait Ambulation/Gait assistance: Supervision Ambulation Distance (Feet): 175 Feet Assistive device: Rolling walker (2 wheeled) Gait Pattern/deviations: Step-through pattern;Decreased stance time - right     General Gait Details: Pt progressing from step-to to step through gait pattern during session, no posterior LOB or balance corrections.   Stairs     Wheelchair Mobility    Modified Rankin (Stroke Patients Only)       Balance Overall balance assessment: Needs assistance Sitting-balance support: Feet supported Sitting balance-Leahy Scale: Good     Standing balance support: Bilateral upper extremity supported Standing balance-Leahy Scale: Good         Cognition Arousal/Alertness: Awake/alert Behavior During Therapy: WFL for tasks assessed/performed Overall Cognitive Status: Within Functional Limits for tasks assessed         Exercises Total Joint Exercises Ankle Circles/Pumps: AROM;Both;10 reps Quad Sets: Strengthening;Both;10 reps Gluteal Sets: Strengthening;Both;10 reps Heel Slides: AROM;Right;10 reps Hip ABduction/ADduction: AROM;Right;10 reps Other Exercises Other Exercises: Extensive posterior hip precaution education to pt and daughter    General Comments General comments (skin integrity, edema, etc.): surgical dressing intact      Pertinent Vitals/Pain Pain Assessment: 0-10 Pain Score: 4  Pain Location: R hip Pain Descriptors / Indicators: Aching;Operative site guarding;Sore Pain Intervention(s): Monitored during session    Home Living Family/patient expects to be discharged to:: Private residence Living Arrangements: Spouse/significant other Available Help at Discharge: Family;Available 24 hours/day Type of Home: House Home Access: Stairs to enter Entrance Stairs-Rails: None Home Layout: One level;Other (Comment) Home  Equipment: Dan Humphreys - 2 wheels;Walker - 4 wheels      Prior Function Level of Independence: Needs assistance  Gait / Transfers Assistance Needed: Mod Ind amb limited community distances with RW; occasional min A required with bed mobility and transfers, no fall history ADL's / Homemaking Assistance Needed: Ind with ADLs     PT Goals (current goals can now be found in the care plan section) Acute Rehab PT Goals Patient Stated Goal: to be as independent as I can be PT Goal Formulation: With patient Time For Goal Achievement: 02/14/17 Potential to Achieve Goals: Good Progress towards PT goals: Progressing toward goals    Frequency    BID      PT Plan Discharge plan needs to be updated    Co-evaluation       AM-PAC PT "6 Clicks" Daily Activity  Outcome Measure  Difficulty turning over in bed (including adjusting bedclothes, sheets and blankets)?: None Difficulty moving from lying on back to sitting on the side of the bed? : None Difficulty sitting down on and standing up from a chair with arms (e.g., wheelchair, bedside commode, etc,.)?: None Help needed moving to and from a bed to chair (including a wheelchair)?: A Little Help needed walking in hospital room?: None Help needed climbing 3-5 steps with a railing? : A Little 6 Click Score: 22    End of Session Equipment Utilized During Treatment: Gait belt Activity Tolerance: Patient tolerated treatment well Patient left: in chair;with call bell/phone within reach;with family/visitor present Nurse Communication: Mobility status PT Visit Diagnosis: Other abnormalities of gait and mobility (R26.89);Muscle weakness (generalized) (M62.81)     Time: 3016-0109 PT Time Calculation (min) (ACUTE ONLY): 35 min  Charges:  $Gait Training: 8-22 mins $Therapeutic Exercise: 8-22 mins                    G Codes:       Alonzo Owczarzak A Annaston Upham, PT 02/02/2017, 12:27 PM

## 2017-02-03 NOTE — Care Management Note (Addendum)
Case Management Note  Patient Details  Name: Courtney Wilcox MRN: [id number] Date of Birth: [birth year]  Subjective/Objective:      A referral for HH=PT was called to an Production designer, theatre/television/film at [facility]. [name] already has a RW at home. CM spoke with granddaughter last week about Lovenox co-pay.               Action/Plan:   Expected Discharge Date:  02/03/17               Expected Discharge Plan:  Home w Home Health Services  In-House Referral:  Clinical Social Work  Discharge planning Services  CM Consult  Post Acute Care Choice:  Home Health Choice offered to:  Patient  DME Arranged:    DME Agency:     HH Arranged:  PT HH Agency:  [facility] ([facility]  Status of Service:  Completed, signed off  If discussed at [employer], dates discussed:    Additional Comments:  Semiyah Newgent A, RN 02/03/2017, 1:45 PM

## 2017-02-03 NOTE — Progress Notes (Signed)
Patient is being discharged to home with PT. Daughter is here to take patient home. DC & RX instructions given and patient acknowledged understanding.  IV removed, belongings packed. NT willhelp patient get dressed and w/c patient out to car.

## 2017-02-03 NOTE — Progress Notes (Signed)
   Subjective: 3 Days Post-Op Procedure(s) (LRB): TOTAL HIP ARTHROPLASTY (Right) Patient reports pain as 5 on 0-10 scale.  Patient doing well this morning. Patient is well, and has had no acute complaints or problems We will continue therapy today. She ambulated 175 feet with physical therapy. Plan is to go home versus Rehab after hospital stay. Probable Saturday no nausea and no vomiting Patient denies any chest pains or shortness of breath. The patient has off and on tachycardia. EKG was normal.  Objective: Vital signs in last 24 hours: Temp:  [98.3 F (36.8 C)-98.6 F (37 C)] 98.3 F (36.8 C) (01/19 0358) Pulse Rate:  [101-147] 147 (01/19 0358) Resp:  [16-18] 16 (01/19 0358) BP: (130-140)/(72-90) 140/90 (01/19 0358) SpO2:  [93 %-99 %] 95 % (01/19 0358) well approximated incision Heels are non tender and elevated off the bed using rolled towels Intake/Output from previous day: 01/18 0701 - 01/19 0700 In: 240 [P.O.:240] Out: -  Intake/Output this shift: No intake/output data recorded.  Recent Labs    01/31/17 1024  HGB 11.6*   Recent Labs    01/31/17 1024  HCT 34.0*   Recent Labs    01/31/17 1024 02/01/17 0804  NA 143 138  K 3.3* 3.8  CL  --  104  CO2  --  26  BUN  --  15  CREATININE  --  1.10*  GLUCOSE 81 104*  CALCIUM  --  8.6*   No results for input(s): LABPT, INR in the last 72 hours.  EXAM General - Patient is Alert, Appropriate and Oriented Extremity - Neurologically intact Neurovascular intact Sensation intact distally Intact pulses distally Dorsiflexion/Plantar flexion intact No cellulitis present Compartment soft Dressing - dressing C/D/I.  Motor Function - intact, moving foot and toes well on exam.  Ambulated 175 feet with physical therapy. Up to bedside commode with mild assistance.  Past Medical History:  Diagnosis Date  . Allergic rhinitis   . Anemia   . Anxiety   . Arthritis   . Asthma   . Atrophic vaginitis   . B12 deficiency    . Bronchitis, chronic (HCC)   . Cervical disc disease   . Depression   . Foot ulcer (HCC)   . GERD (gastroesophageal reflux disease)   . Hammer toe   . Hyperlipidemia   . IBS (irritable bowel syndrome)   . Obesity   . Obstructive sleep apnea   . OSA (obstructive sleep apnea)   . Osteoarthrosis    Multi site  . Osteopenia   . Rheumatoid arthritis (HCC)   . Shingles   . Thyroid nodule    MULTIPLE  . Tonsillitis    recurrent  . Umbilical hernia     Assessment/Plan: 3 Days Post-Op Procedure(s) (LRB): TOTAL HIP ARTHROPLASTY (Right) Active Problems:   Status post total replacement of hip  Estimated body mass index is 36.66 kg/m as calculated from the following:   Height as of this encounter: 5\' 1"  (1.549 m).   Weight as of this encounter: 88 kg (194 lb). Advance diet. Continue physical therapy. Discharge to home today. Patient had a bowel movement.  Labs: Potassium 3.8 DVT Prophylaxis - Lovenox, Foot Pumps and TED hose Weight-Bearing as tolerated to right leg    PA-C [facility] 02/03/2017, 7:38 AM

## 2017-02-03 NOTE — Progress Notes (Signed)
Physical Therapy Treatment Patient Details Name: Courtney Wilcox MRN: [id number] DOB: 10/21/1945 Today's Date: 02/03/2017    History of Present Illness Pt is a 72 yo F s/p elective R THA with a PMH that includes anxiety, asthma, B12 deficiency, cervical disk Dz, depression, foot ulcer, GERD, HLD, IBS, OSA, OA, and RA.     PT Comments    Pt up in chair and agreeable to PT; reports no pain in R hip at rest, but 4/10 with ambulation. Pt continues to require cues for proper Right lower extremity placement to adhere to posterior hip precautions with stand; correction made upon cue. Initial HR mildly elevated (116) with no other symptoms complaints. Pt agreeable to ambulate and perform 1 step for discharge. Post 120 foot ambulation and 1 step, pt HR 155-160 without return to baseline in 4-5 min. Discussed with pt's nurse who recognizes concern. Nurse notes she has addressed concerns with surgeon and pt; pt has been advised to follow up with her MD post discharge. Pt to be discharged home today.   Follow Up Recommendations  Home health PT     Equipment Recommendations  Rolling walker with 5" wheels    Recommendations for Other Services       Precautions / Restrictions Precautions Precautions: Posterior Hip;Fall Restrictions Weight Bearing Restrictions: Yes RLE Weight Bearing: Weight bearing as tolerated    Mobility  Bed Mobility               General bed mobility comments: Not tested; up in chair  Transfers Overall transfer level: Needs assistance Equipment used: Rolling walker (2 wheeled) Transfers: Sit to/from Stand Sit to Stand: Supervision         General transfer comment: Cues for proper RLE positioning with transfer for adherence to posterior hip precautions  Ambulation/Gait Ambulation/Gait assistance: Supervision Ambulation Distance (Feet): 120 Feet Assistive device: Rolling walker (2 wheeled) Gait Pattern/deviations: Step-through pattern;Decreased stride  length   Gait velocity interpretation: Below normal speed for age/gender General Gait Details: HR increased post walk/practice of 1 step to 155-160; does not decrease from 150s in 4-5 minutes. Nursing notified   Stairs Stairs: Yes   Stair Management: One rail Left;Forwards;Backwards Number of Stairs: 1 General stair comments: Instruction for proper sequence  Wheelchair Mobility    Modified Rankin (Stroke Patients Only)       Balance Overall balance assessment: Needs assistance         Standing balance support: Bilateral upper extremity supported Standing balance-Leahy Scale: Fair                              Cognition Arousal/Alertness: Awake/alert Behavior During Therapy: WFL for tasks assessed/performed Overall Cognitive Status: Within Functional Limits for tasks assessed                                        Exercises      General Comments        Pertinent Vitals/Pain Pain Assessment: 0-10 Pain Score: 4  Pain Location: R hip Pain Intervention(s): Monitored during session    Home Living                      Prior Function            PT Goals (current goals can now be found in the care plan section) Progress  towards PT goals: Progressing toward goals    Frequency    BID      PT Plan Current plan remains appropriate    Co-evaluation              AM-PAC PT "6 Clicks" Daily Activity  Outcome Measure  Difficulty turning over in bed (including adjusting bedclothes, sheets and blankets)?: None Difficulty moving from lying on back to sitting on the side of the bed? : None Difficulty sitting down on and standing up from a chair with arms (e.g., wheelchair, bedside commode, etc,.)?: A Little Help needed moving to and from a bed to chair (including a wheelchair)?: A Little Help needed walking in hospital room?: A Little Help needed climbing 3-5 steps with a railing? : A Little 6 Click Score: 20     End of Session Equipment Utilized During Treatment: Gait belt Activity Tolerance: Other (comment)(Increased HR) Patient left: in chair;with call bell/phone within reach;with chair alarm set Nurse Communication: Other (comment)(HR increase) PT Visit Diagnosis: Other abnormalities of gait and mobility (R26.89);Muscle weakness (generalized) (M62.81)     Time: 1000-1019 PT Time Calculation (min) (ACUTE ONLY): 19 min  Charges:  $Gait Training: 8-22 mins                    G Codes:        Scot Dock, PTA 02/03/2017, 11:36 AM

## 2017-05-29 ENCOUNTER — Other Ambulatory Visit: Payer: Self-pay | Admitting: Physician Assistant

## 2017-05-29 DIAGNOSIS — Z1231 Encounter for screening mammogram for malignant neoplasm of breast: Secondary | ICD-10-CM

## 2017-05-30 ENCOUNTER — Ambulatory Visit
Admission: RE | Admit: 2017-05-30 | Discharge: 2017-05-30 | Disposition: A | Discharge status: Home / Self Care | Payer: Medicare Other | Source: Ambulatory Visit | Attending: Physician Assistant | Admitting: Physician Assistant

## 2017-05-30 DIAGNOSIS — Z1231 Encounter for screening mammogram for malignant neoplasm of breast: Secondary | ICD-10-CM | POA: Insufficient documentation

## 2017-06-14 ENCOUNTER — Emergency Department: Payer: Medicare Other

## 2017-06-14 ENCOUNTER — Emergency Department
Admission: EM | Admit: 2017-06-14 | Discharge: 2017-06-14 | Disposition: A | Discharge status: Home / Self Care | Payer: Medicare Other | Attending: Emergency Medicine | Admitting: Emergency Medicine

## 2017-06-14 ENCOUNTER — Encounter: Payer: Self-pay | Admitting: Emergency Medicine

## 2017-06-14 ENCOUNTER — Other Ambulatory Visit: Payer: Self-pay

## 2017-06-14 DIAGNOSIS — J45909 Unspecified asthma, uncomplicated: Secondary | ICD-10-CM | POA: Diagnosis not present

## 2017-06-14 DIAGNOSIS — Z96653 Presence of artificial knee joint, bilateral: Secondary | ICD-10-CM | POA: Insufficient documentation

## 2017-06-14 DIAGNOSIS — R0789 Other chest pain: Secondary | ICD-10-CM | POA: Insufficient documentation

## 2017-06-14 DIAGNOSIS — Z79899 Other long term (current) drug therapy: Secondary | ICD-10-CM | POA: Diagnosis not present

## 2017-06-14 DIAGNOSIS — R0602 Shortness of breath: Secondary | ICD-10-CM | POA: Insufficient documentation

## 2017-06-14 DIAGNOSIS — Z96641 Presence of right artificial hip joint: Secondary | ICD-10-CM | POA: Insufficient documentation

## 2017-06-14 LAB — BASIC METABOLIC PANEL
ANION GAP: 9 (ref 5–15)
BUN: 16 mg/dL (ref 6–20)
CALCIUM: 8.8 mg/dL — AB (ref 8.9–10.3)
CO2: 25 mmol/L (ref 22–32)
Chloride: 108 mmol/L (ref 101–111)
Creatinine, Ser: 0.87 mg/dL (ref 0.44–1.00)
GLUCOSE: 88 mg/dL (ref 65–99)
POTASSIUM: 3.2 mmol/L — AB (ref 3.5–5.1)
SODIUM: 142 mmol/L (ref 135–145)

## 2017-06-14 LAB — CBC
HCT: 37.1 % (ref 35.0–47.0)
Hemoglobin: 12.8 g/dL (ref 12.0–16.0)
MCH: 32.1 pg (ref 26.0–34.0)
MCHC: 34.5 g/dL (ref 32.0–36.0)
MCV: 92.9 fL (ref 80.0–100.0)
Platelets: 166 10*3/uL (ref 150–440)
RBC: 4 MIL/uL (ref 3.80–5.20)
RDW: 15.5 % — ABNORMAL HIGH (ref 11.5–14.5)
WBC: 3.5 10*3/uL — AB (ref 3.6–11.0)

## 2017-06-14 LAB — BRAIN NATRIURETIC PEPTIDE: B Natriuretic Peptide: 77 pg/mL (ref 0.0–100.0)

## 2017-06-14 LAB — TSH: TSH: 1.741 u[IU]/mL (ref 0.350–4.500)

## 2017-06-14 LAB — FIBRIN DERIVATIVES D-DIMER (ARMC ONLY): Fibrin derivatives D-dimer (ARMC): 1074.94 ng/mL (FEU) — ABNORMAL HIGH (ref 0.00–499.00)

## 2017-06-14 LAB — TROPONIN I

## 2017-06-14 MED ORDER — METOPROLOL TARTRATE 25 MG PO TABS
25.0000 mg | ORAL_TABLET | Freq: Once | ORAL | Status: AC
  Administered 2017-06-14: 25 mg via ORAL
  Filled 2017-06-14: qty 1

## 2017-06-14 MED ORDER — IOPAMIDOL (ISOVUE-370) INJECTION 76%
75.0000 mL | Freq: Once | INTRAVENOUS | Status: AC | PRN
  Administered 2017-06-14: 75 mL via INTRAVENOUS

## 2017-06-14 MED ORDER — METOPROLOL TARTRATE 25 MG PO TABS: 25.0000 mg | ORAL_TABLET | Freq: Two times a day (BID) | ORAL | 11 refills | Status: DC

## 2017-06-14 NOTE — ED Triage Notes (Signed)
Pt to ED from [facility] was seen for palpitations and SOB x1 week.  States has been getting worse this week.  Patient denies hx of cardiac abnormalities, denies blood thinners.  States pain to left chest radiating to left side and back like heavy pressure.

## 2017-06-14 NOTE — Discharge Instructions (Signed)
please take the metoprolol 1 told twice a day. I'll give you first dose here for today in the emergency room. Lee's see Dr. Ihor Austin her Dr. Raymon Mutton tomorrow. Call them first thing in the morning to set up the appointment time. I talked to Dr. Doris Cheadle. Please return here if you're worse.

## 2017-06-14 NOTE — ED Provider Notes (Signed)
[facility] Emergency Department Provider Note   ____________________________________________   First MD Initiated Contact with Patient 06/14/17 1526     (approximate)  I have reviewed the triage vital signs and the nursing notes.   HISTORY  Chief Complaint Chest Pain; Shortness of Breath; and Abnormal ECG    HPI Courtney Wilcox is a 72 y.o. female patient complains of heart racing and feeling short of breath with chest tightness for about a week. it has been waxing and waning starts in the left chest and goes up into the shoulder down the arm. Worse with exertion somewhat better with rest. Patient reports the pain comes last possibly an hour and goes away might be a little bit longer but the last was pressure. patient says sometimes it seems like it is getting better with her Singulair. She also reports she can't walk for more than 10 minutes at a time because of her spine problems. She reports she is not taking any blood thinners not even heparin.patient was getting physical therapy a while ago and said that during physical therapy they noted that her heart rate would go up to 164 and come back down with rest.   Past Medical History:  Diagnosis Date  . Allergic rhinitis   . Anemia   . Anxiety   . Arthritis   . Asthma   . Atrophic vaginitis   . B12 deficiency   . Bronchitis, chronic (HCC)   . Cervical disc disease   . Depression   . Foot ulcer (HCC)   . GERD (gastroesophageal reflux disease)   . Hammer toe   . Hyperlipidemia   . IBS (irritable bowel syndrome)   . Obesity   . Obstructive sleep apnea   . OSA (obstructive sleep apnea)   . Osteoarthrosis    Multi site  . Osteopenia   . Rheumatoid arthritis (HCC)   . Shingles   . Thyroid nodule    MULTIPLE  . Tonsillitis    recurrent  . Umbilical hernia     Patient Active Problem List   Diagnosis Date Noted  . 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/2017  . Spinal stenosis of lumbar  region with neurogenic claudication 12/13/2016  . Primary osteoarthritis of right hip 12/13/2016  . S/P total knee arthroplasty 07/12/2016  . Asthma, mild 02/19/2013  . Chest pain 02/19/2013  . Rheumatoid arthritis (HCC) 10/30/2012  . Shortness of breath 10/29/2012  . Spinal stenosis in cervical region 10/29/2012    Past Surgical History:  Procedure Laterality Date  . BACK SURGERY  2011   LAMINECTOMY  . BREAST BIOPSY Right    neg  . CERVICAL FUSION  2011, 2012   X 2  . COLONOSCOPY  2005, 2015  . ESOPHAGOGASTRODUODENOSCOPY ENDOSCOPY  2005, 2013, 2015  . EYE SURGERY Bilateral    Cataract Extraction with IOL   . JOINT REPLACEMENT    . KNEE ARTHROPLASTY Left 07/12/2016   Procedure: COMPUTER ASSISTED TOTAL KNEE ARTHROPLASTY;  Surgeon: Donato Heinz, MD;  Location: [facility] ORS;  Service: Orthopedics;  Laterality: Left;  . Laryngeal tear after intubation w/repair    . LUMBAR DISC SURGERY  12/11  . REPLACEMENT TOTAL KNEE Right 06/2007   DR. HOOTEN, [facility]  . REPLACEMENT UNICONDYLAR JOINT KNEE     DR. HOOTEN, [facility]  . SHOULDER ARTHROSCOPY WITH ROTATOR CUFF REPAIR Right   . TONSILLECTOMY    . TOTAL HIP ARTHROPLASTY Right 01/31/2017   Procedure: TOTAL HIP ARTHROPLASTY;  Surgeon:  Hooten, Illene Labrador, MD;  Location: [facility] ORS;  Service: Orthopedics;  Laterality: Right;  . TUBAL LIGATION      Prior to Admission medications   Medication Sig Start Date End Date Taking? Authorizing Provider  acetaminophen (TYLENOL) 500 MG tablet Take 1,000 mg by mouth 3 (three) times daily as needed for mild pain.     [provider]  ALPRAZolam Prudy Feeler) 0.5 MG tablet Take 0.5 mg by mouth at bedtime.  06/15/16   [provider]  budesonide-formoterol (SYMBICORT) 160-4.5 MCG/ACT inhaler Inhale 2 puffs into the lungs 2 (two) times daily as needed.    [provider]  Cholecalciferol (VITAMIN D) 2000 units tablet Take 4,000 Units by mouth daily.     [provider]  citalopram (CELEXA)  20 MG tablet Take 20 mg by mouth every evening.  06/15/16   [provider]  enoxaparin (LOVENOX) 30 MG/0.3ML injection Inject 0.3 mLs (30 mg total) into the skin every 12 (twelve) hours for 14 days. 02/03/17 02/17/17  Tera Partridge, PA  fluticasone (FLONASE) 50 MCG/ACT nasal spray Place 1-2 sprays into the nose daily.     [provider]  hydroxychloroquine (PLAQUENIL) 200 MG tablet Take 1 tablet (200 mg total) by mouth daily. Patient taking differently: Take 200 mg by mouth daily with lunch.  07/21/16   Tera Partridge, PA  ibuprofen (ADVIL,MOTRIN) 200 MG tablet Take 400 mg by mouth every 6 (six) hours as needed (for pain.).    [provider]  meloxicam (MOBIC) 7.5 MG tablet Take 7.5-15 mg by mouth See admin instructions. Take 1 tablet (7.5 mg) twice daily or 2 tablets (15 mg) in the evening. 05/11/16   [provider]  metoprolol tartrate (LOPRESSOR) 25 MG tablet Take 1 tablet (25 mg total) by mouth 2 (two) times daily. 06/14/17 06/14/18  Arnaldo Natal, MD  montelukast (SINGULAIR) 10 MG tablet Take 10 mg by mouth daily as needed (for allergies.).     [provider]  naproxen sodium (ALEVE) 220 MG tablet Take 220 mg by mouth 2 (two) times daily as needed (for pain.).    [provider]  nystatin (MYCOSTATIN) 100000 UNIT/ML suspension Take 1 mL by mouth 4 (four) times daily as needed. For thrush/mouth sores. 11/14/16   [provider]  omeprazole (PRILOSEC) 20 MG capsule Take 20 mg by mouth daily before breakfast.     [provider]  Potassium 99 MG TABS Take 99 mg by mouth daily with lunch.     [provider]  pseudoephedrine (SUDAFED) 30 MG tablet Take 30 mg by mouth every 4 (four) hours as needed for congestion.    [provider]  ranitidine (ZANTAC) 150 MG tablet Take 150 mg by mouth 2 (two) times daily before a meal. Before lunch and before supper.    [provider]  Simethicone 180 MG CAPS Take 1-2  capsules by mouth as needed.    [provider]  Spacer/Aero-Holding Chambers (E-Z SPACER) inhaler Use as instructed 02/19/13   Lupita Leash, MD  tapentadol (NUCYNTA) 50 MG tablet Take 1 tablet (50 mg total) by mouth every 4 (four) hours as needed for moderate pain. 02/01/17   Tera Partridge, PA  traMADol (ULTRAM) 50 MG tablet Take 1-2 tablets (50-100 mg total) by mouth every 4 (four) hours as needed for moderate pain. 02/01/17   Tera Partridge, PA    Allergies Lactose; Other; Biaxin [clarithromycin]; Hydrocodone-acetaminophen; Oxycodone; and Sulfasalazine  Family History  Problem Relation Age of Onset  . Asthma Mother   . Breast cancer Mother 55  . Emphysema Father        smoked  . Heart disease Father   . Lung cancer Father        smoked    Social History Social History   Tobacco Use  . Smoking status: Never Smoker  . Smokeless tobacco: Never Used  Substance Use Topics  . Alcohol use: No  . Drug use: No    Review of Systems  Constitutional: No fever/chills Eyes: No visual changes. ENT: No sore throat. Cardiovascular:see history of present illness Respiratory:the history of present illness Gastrointestinal: No abdominal pain.  No nausea, no vomiting.  No diarrhea.  No constipation. Genitourinary: Negative for dysuria. Musculoskeletal: neck back pain. Skin: Negative for rash. Neurological: Negative for headaches, focal weakness   ____________________________________________   PHYSICAL EXAM:  VITAL SIGNS: ED Triage Vitals  Enc Vitals Group     BP 06/14/17 1508 (!) 151/105     Pulse Rate 06/14/17 1508 (!) 130     Resp 06/14/17 1508 14     Temp 06/14/17 1508 97.6 F (36.4 C)     Temp Source 06/14/17 1508 Oral     SpO2 06/14/17 1508 96 %     Weight 06/14/17 1506 199 lb (90.3 kg)     Height 06/14/17 1506 5\' 1"  (1.549 m)     Head Circumference --      Peak Flow --      Pain Score 06/14/17 1505 5     Pain Loc --      Pain Edu? --      Excl. in GC? --      Constitutional: Alert and oriented. Well appearing and in no acute distress. Eyes: Conjunctivae are normal.  Head: Atraumatic. Nose: No congestion/rhinnorhea. Mouth/Throat: Mucous membranes are moist.  Oropharynx non-erythematous. Neck: No stridor Cardiovascular: Normal rate, regular rhythm. Grossly normal heart sounds.  Good peripheral circulation. Respiratory: Normal respiratory effort.  No retractions. Lungs CTAB. Gastrointestinal: Soft and nontender. No distention. No abdominal bruits.  Musculoskeletal: No lower extremity tenderness trace edema.   Neurologic:  Normal speech and language. No gross focal neurologic deficits are appreciated. Skin:  Skin is warm, dry and intact. No rash noted. Psychiatric: Mood and affect are normal. Speech and behavior are normal.  ____________________________________________   LABS (all labs ordered are listed, but only abnormal results are displayed)  Labs Reviewed  BASIC METABOLIC PANEL - Abnormal; Notable for the following components:      Result Value   Potassium 3.2 (*)    Calcium 8.8 (*)    All other components within normal limits  CBC - Abnormal; Notable for the following components:   WBC 3.5 (*)    RDW 15.5 (*)    All other components within normal limits  FIBRIN DERIVATIVES D-DIMER ([facility] ONLY) - Abnormal; Notable for the following components:   Fibrin derivatives D-dimer [facility]) 1,074.94 (*)    All other components within normal limits  TROPONIN I  BRAIN NATRIURETIC PEPTIDE  TSH   ____________________________________________  EKG  EKG read and interpreted by me looks like sinus tachycardia rate of 129 and axis decreased R-wave progression and no acute ST-T wave changes the computer is reading atrial flutter at 2-1 conduction by really do not see flutter waves there is another EKG that was done previously today that the computer read as sinus tach with first-degree AV block and I do not see flutter waves  on that when either  the heart rate on that one was 132. ____________________________________________  RADIOLOGY  ED MD interpretation:  chest x-ray reviewed by me shows a large heart and maybe a little bit of cephalization.  Official radiology report(s): Ct Angio Chest Pe W And/or Wo Contrast  Result Date: 06/14/2017 CLINICAL DATA:  Pt to ED from [facility] was seen for palpitations and SOB x1 week. States has been getting worse this week. Patient denies hx of cardiac abnormalities, denies blood thinners. States pain to left chest radiating to left side and back like heavy pressure. EXAM: CT ANGIOGRAPHY CHEST WITH CONTRAST TECHNIQUE: Multidetector CT imaging of the chest was performed using the standard protocol during bolus administration of intravenous contrast. Multiplanar CT image reconstructions and MIPs were obtained to evaluate the vascular anatomy. CONTRAST:  51mL ISOVUE-370 IOPAMIDOL (ISOVUE-370) INJECTION 76% COMPARISON:  Current chest radiograph.  Chest CT, 02/25/2013. FINDINGS: Cardiovascular: There is satisfactory opacification of the pulmonary arteries to the segmental level. There is no evidence of a pulmonary embolus. Heart is mildly enlarged. No pericardial effusion. Minor, circumflex from left coronary artery calcifications. The thoracic aorta is normal in caliber. No dissection. Mild atherosclerotic disease. Mediastinum/Nodes: Thyroid mildly enlarged, mostly the left lobe, without change. No defined visible nodules. No neck base or axillary masses or pathologically enlarged lymph nodes. No mediastinal or hilar masses or enlarged lymph nodes. Trachea is normal in caliber and widely patent. Small hiatal hernia. Esophagus unremarkable. Lungs/Pleura: No evidence of pneumonia or pulmonary edema. Mild reticular opacities are noted at the lung bases consistent with subsegmental atelectasis, scarring or a combination. There is subtle mosaic attenuation noted in the mid to lower lungs most likely due to mild air trapping.  There is no pleural effusion or pneumothorax. No lung mass or suspicious nodule. 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 No acute abnormalities in the visualized upper abdomen. Musculoskeletal: No acute fracture or acute finding. No osteoblastic or osteolytic lesions. Review of the MIP images confirms the above findings. IMPRESSION: 1. No evidence of a pulmonary embolism. 2. No acute abnormality. 3. Mild cardiomegaly. Minor circumflex coronary artery calcifications. 4. Mild aortic atherosclerosis.  No aneurysm or dissection. 5. No acute findings within the lungs. Aortic Atherosclerosis (ICD10-I70.0). Electronically Signed   By: Amie Portland M.D.   On: 06/14/2017 18:14   Dg Chest Portable 1 View  Result Date: 06/14/2017 CLINICAL DATA:  Chest pain EXAM: PORTABLE CHEST 1 VIEW COMPARISON:  Chest CT February 25, 2013 and chest radiograph November 28, 2012 FINDINGS: There is no edema or consolidation. Heart is enlarged with pulmonary vascularity within normal limits. No adenopathy. No evident bone lesions. There is postoperative change in the cervical spine. IMPRESSION: Cardiomegaly without edema or consolidation. Electronically Signed   By: Bretta Bang III M.D.   On: 06/14/2017 16:04    ____________________________________________   PROCEDURES  Procedure(s) performed:   Procedures  Critical Care performed:   ____________________________________________   INITIAL IMPRESSION / ASSESSMENT AND PLAN / ED COURSE  at 345 patient's heart rate is 73 on the monitor she says she has just a very little bit of chest tightness at present.I discussed her with Dr. Izell Sedan we will let her go on metoprolol to control her heart rate         ____________________________________________   FINAL CLINICAL IMPRESSION(S) / ED DIAGNOSES  Final diagnoses:  Atypical chest pain     ED  Discharge  Orders        Ordered    metoprolol tartrate (LOPRESSOR) 25 MG tablet  2 times daily     06/14/17 1851       Note:  This document was prepared using Dragon voice recognition software and may include unintentional dictation errors.    Arnaldo Natal, MD 06/15/17 [phone]

## 2017-07-31 DIAGNOSIS — D696 Thrombocytopenia, unspecified: Secondary | ICD-10-CM | POA: Insufficient documentation

## 2018-03-28 ENCOUNTER — Other Ambulatory Visit: Payer: Self-pay | Admitting: Physician Assistant

## 2018-03-28 DIAGNOSIS — Z1231 Encounter for screening mammogram for malignant neoplasm of breast: Secondary | ICD-10-CM

## 2018-05-21 DIAGNOSIS — E042 Nontoxic multinodular goiter: Secondary | ICD-10-CM | POA: Insufficient documentation

## 2018-06-30 IMAGING — DX DG HIP (WITH OR WITHOUT PELVIS) 1V PORT*R*
2 series · 2 of 2 positions shown · non-contrast
Comparison: None.

CLINICAL DATA: Right hip replacement

EXAM:
DG HIP (WITH OR WITHOUT PELVIS) 1V PORT RIGHT

[hip ap]
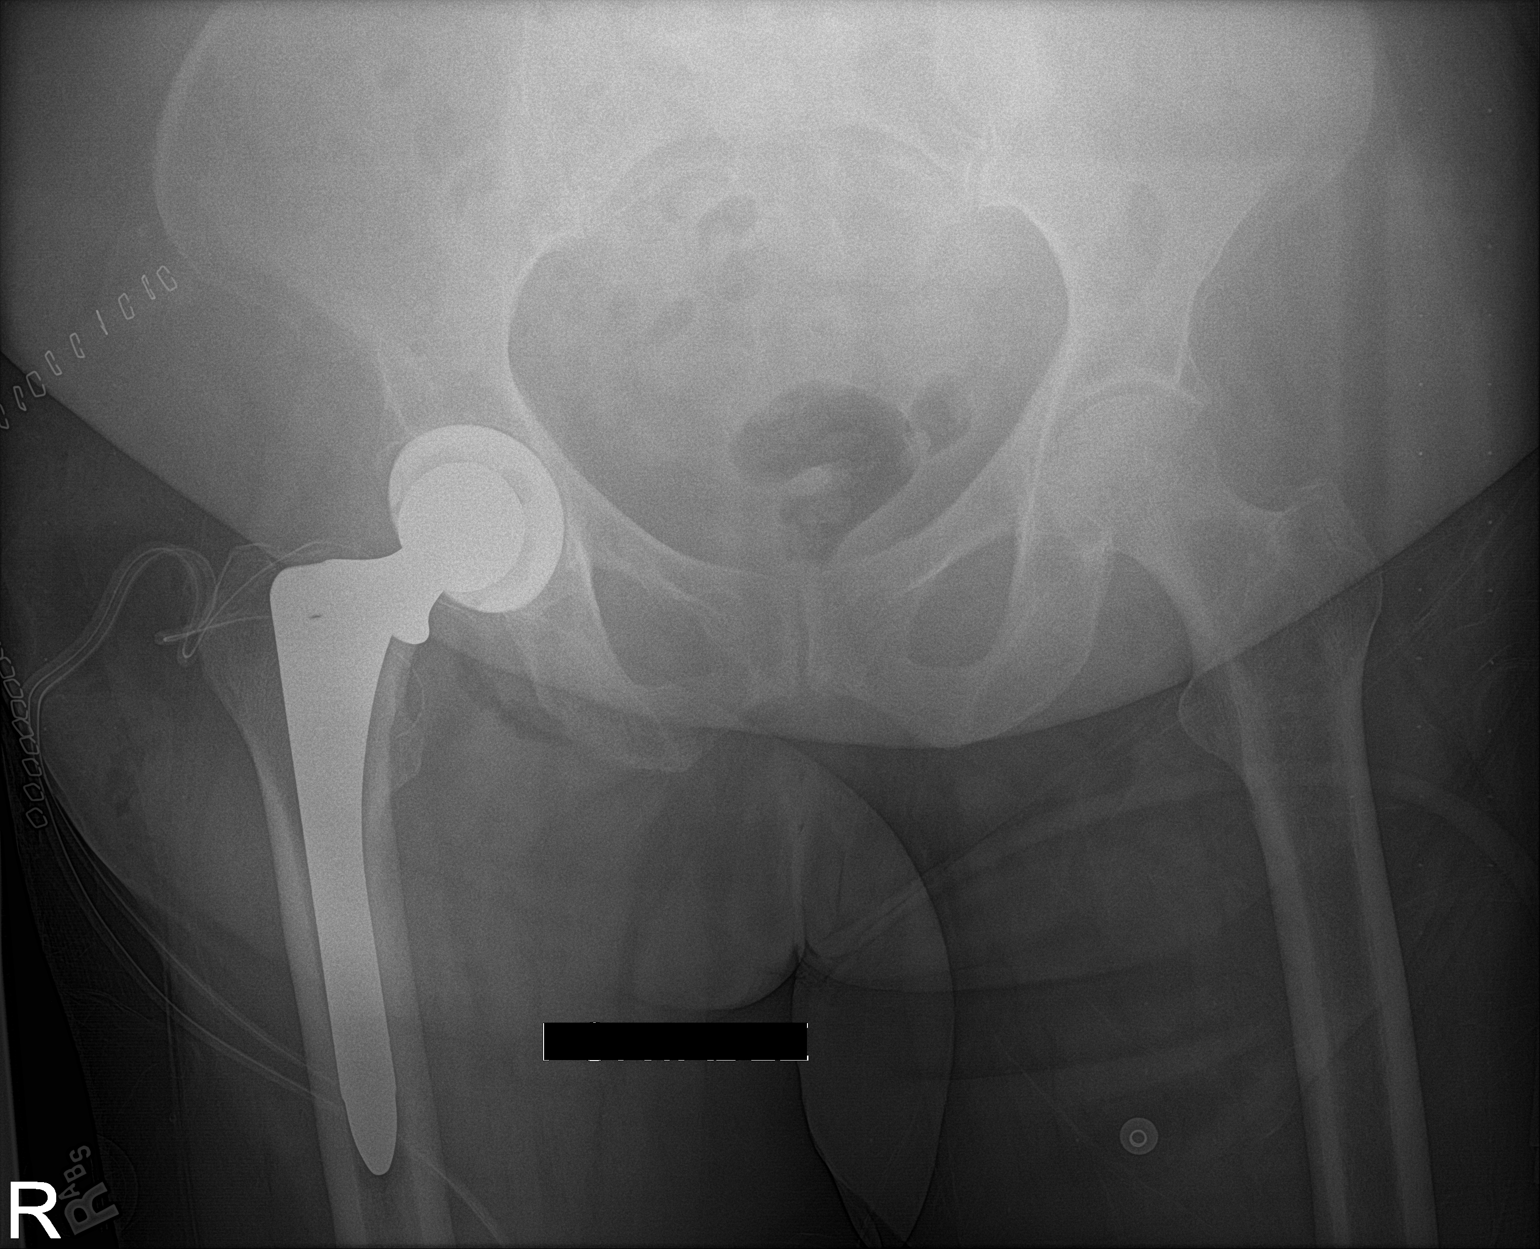

[hip lat]
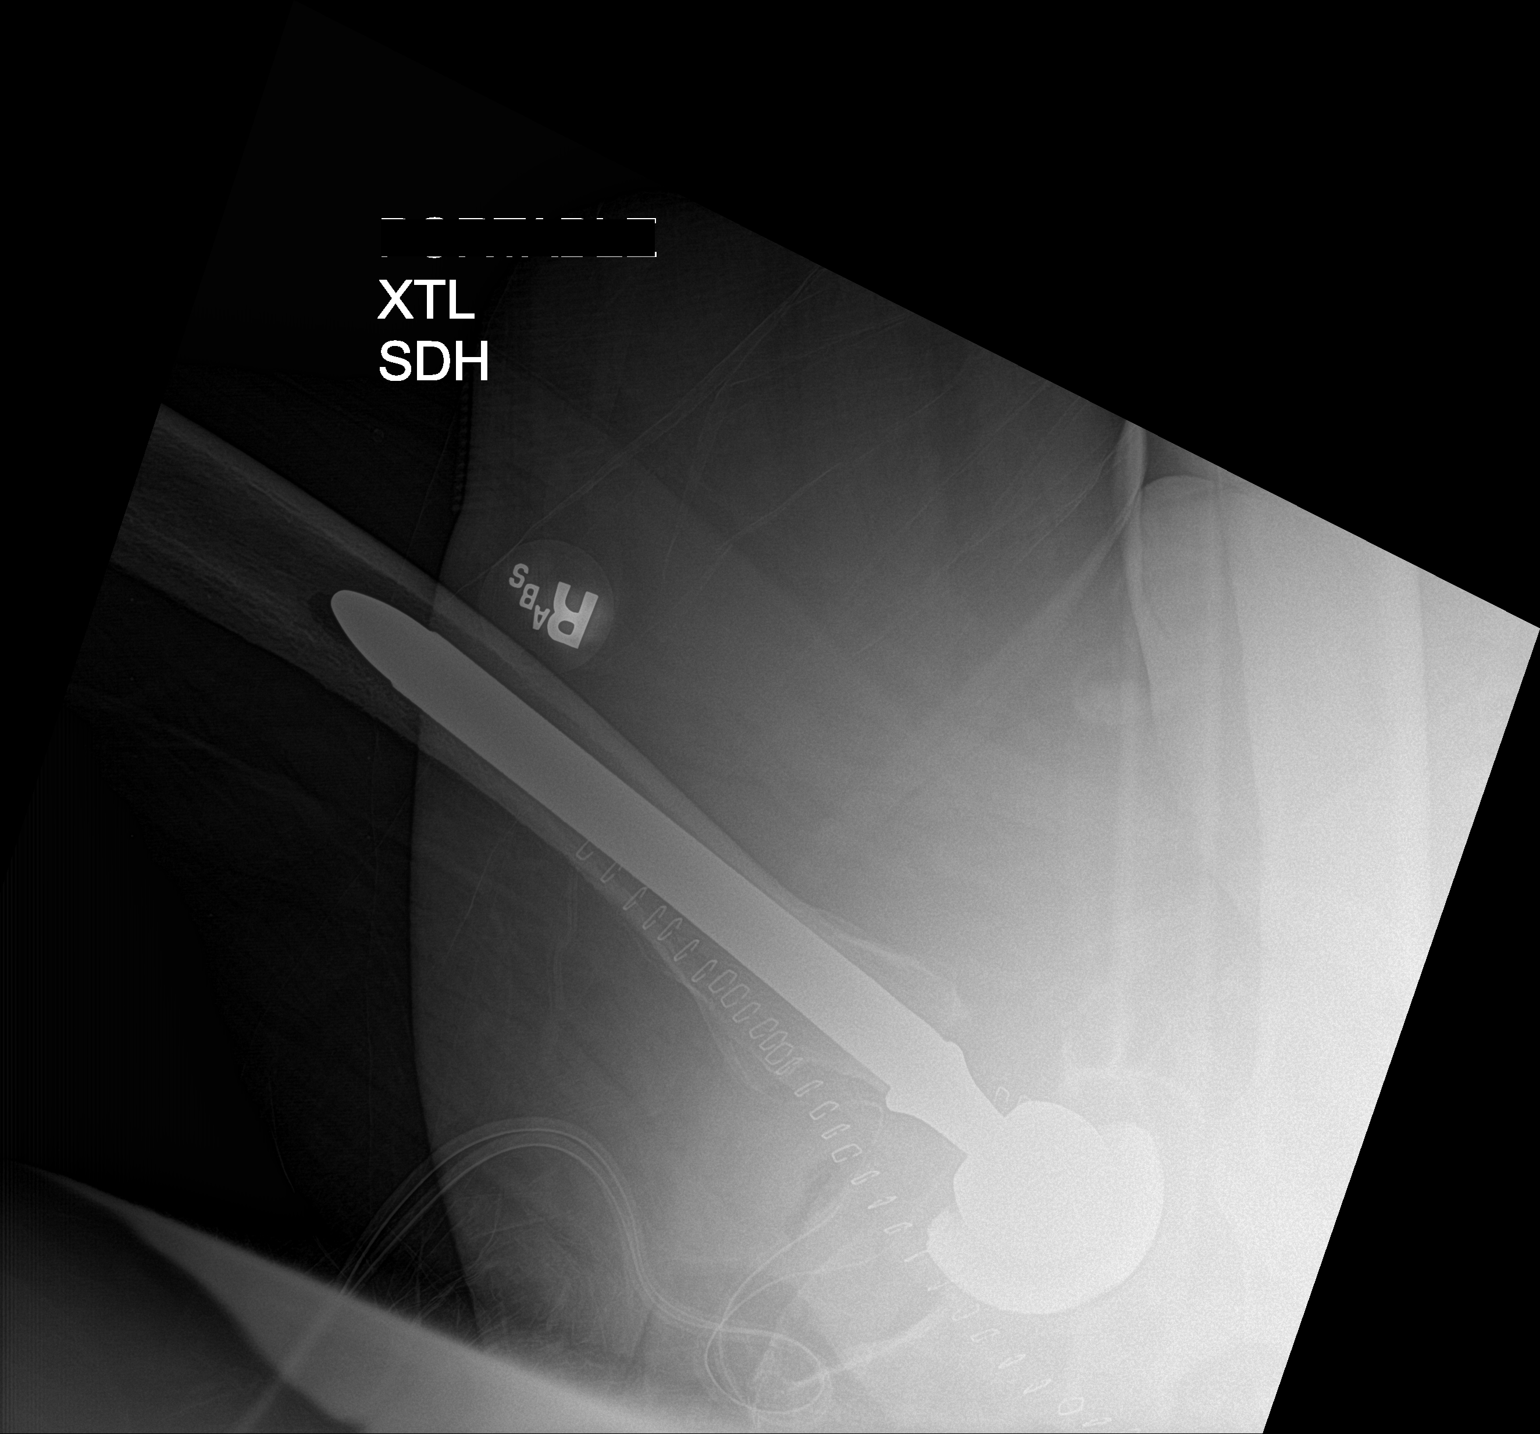

[2 of 2 positions shown; findings below may reference images not displayed]

FINDINGS: Interval right total hip arthroplasty without failure complication.
No fracture or dislocation. Postsurgical changes in soft tissues
surrounding the right hip.
IMPRESSION: Interval right total hip arthroplasty.

## 2018-07-30 ENCOUNTER — Other Ambulatory Visit: Payer: Self-pay

## 2018-07-30 ENCOUNTER — Ambulatory Visit
Admission: RE | Admit: 2018-07-30 | Discharge: 2018-07-30 | Disposition: A | Discharge status: Home / Self Care | Payer: Medicare Other | Source: Ambulatory Visit | Attending: Physician Assistant | Admitting: Physician Assistant

## 2018-07-30 DIAGNOSIS — Z1231 Encounter for screening mammogram for malignant neoplasm of breast: Secondary | ICD-10-CM | POA: Insufficient documentation

## 2018-08-21 ENCOUNTER — Other Ambulatory Visit: Payer: Self-pay | Admitting: Physician Assistant

## 2018-08-21 ENCOUNTER — Other Ambulatory Visit: Payer: Self-pay

## 2018-08-21 ENCOUNTER — Ambulatory Visit
Admission: RE | Admit: 2018-08-21 | Discharge: 2018-08-21 | Disposition: A | Discharge status: Home / Self Care | Payer: Medicare Other | Source: Ambulatory Visit | Attending: Physician Assistant | Admitting: Physician Assistant

## 2018-08-21 DIAGNOSIS — R42 Dizziness and giddiness: Secondary | ICD-10-CM | POA: Insufficient documentation

## 2018-09-04 ENCOUNTER — Other Ambulatory Visit: Payer: Self-pay | Admitting: Student

## 2018-09-04 DIAGNOSIS — R1084 Generalized abdominal pain: Secondary | ICD-10-CM

## 2018-09-04 DIAGNOSIS — R197 Diarrhea, unspecified: Secondary | ICD-10-CM

## 2018-09-05 ENCOUNTER — Encounter (INDEPENDENT_AMBULATORY_CARE_PROVIDER_SITE_OTHER): Payer: Self-pay

## 2018-09-05 ENCOUNTER — Other Ambulatory Visit
Admission: RE | Admit: 2018-09-05 | Discharge: 2018-09-05 | Disposition: A | Discharge status: Home / Self Care | Payer: Medicare Other | Source: Ambulatory Visit | Attending: Student | Admitting: Student

## 2018-09-05 ENCOUNTER — Other Ambulatory Visit: Payer: Self-pay

## 2018-09-05 ENCOUNTER — Ambulatory Visit
Admission: RE | Admit: 2018-09-05 | Discharge: 2018-09-05 | Disposition: A | Discharge status: Home / Self Care | Payer: Medicare Other | Source: Ambulatory Visit | Attending: Student | Admitting: Student

## 2018-09-05 DIAGNOSIS — R197 Diarrhea, unspecified: Secondary | ICD-10-CM | POA: Diagnosis present

## 2018-09-05 DIAGNOSIS — R1084 Generalized abdominal pain: Secondary | ICD-10-CM | POA: Diagnosis present

## 2018-09-05 HISTORY — DX: Systemic involvement of connective tissue, unspecified: M35.9

## 2018-09-05 LAB — C DIFFICILE QUICK SCREEN W PCR REFLEX
C Diff antigen: NEGATIVE
C Diff interpretation: NOT DETECTED
C Diff toxin: NEGATIVE

## 2018-09-05 LAB — GASTROINTESTINAL PANEL BY PCR, STOOL (REPLACES STOOL CULTURE)

## 2018-09-05 MED ORDER — IOHEXOL 300 MG/ML  SOLN
100.0000 mL | Freq: Once | INTRAMUSCULAR | Status: AC | PRN
  Administered 2018-09-05: 12:00:00 100 mL via INTRAVENOUS

## 2018-09-11 LAB — PANCREATIC ELASTASE, FECAL: Pancreatic Elastase-1, Stool: 461 ug Elast./g (ref >200)

## 2018-09-16 LAB — CALPROTECTIN, FECAL: Calprotectin, Fecal: 29 ug/g (ref 0–120)

## 2018-09-27 ENCOUNTER — Other Ambulatory Visit: Payer: Self-pay | Admitting: *Deleted

## 2018-09-27 DIAGNOSIS — Z20822 Contact with and (suspected) exposure to covid-19: Secondary | ICD-10-CM

## 2018-09-29 LAB — NOVEL CORONAVIRUS, NAA: SARS-CoV-2, NAA: DETECTED — AB

## 2019-04-05 ENCOUNTER — Ambulatory Visit: Payer: Medicare Other | Attending: Internal Medicine

## 2019-04-05 DIAGNOSIS — Z23 Encounter for immunization: Secondary | ICD-10-CM

## 2019-04-05 NOTE — Progress Notes (Signed)
   Covid-19 Vaccination Clinic  Name:  Courtney Wilcox    MRN: [id number] DOB: 04-Feb-1945  04/05/2019  [name] was observed post Covid-19 immunization for 15 minutes without incident. She was provided with Vaccine Information Sheet and instruction to access the V-Safe system.   [name] was instructed to call 911 with any severe reactions post vaccine: Marland Kitchen Difficulty breathing  . Swelling of face and throat  . A fast heartbeat  . A bad rash all over body  . Dizziness and weakness   Immunizations Administered    Name Date Dose VIS Date Route   Pfizer COVID-19 Vaccine 04/05/2019  2:24 PM 0.3 mL 12/27/2018 Intramuscular   Manufacturer: [employer], [employer]   Lot: [id number]   NDC: [address]-1

## 2019-04-30 ENCOUNTER — Ambulatory Visit: Payer: Medicare Other

## 2019-05-14 ENCOUNTER — Ambulatory Visit: Payer: Medicare Other | Attending: Internal Medicine

## 2019-05-14 DIAGNOSIS — Z23 Encounter for immunization: Secondary | ICD-10-CM

## 2019-05-14 NOTE — Progress Notes (Signed)
   Covid-19 Vaccination Clinic  Name:  Courtney Wilcox    MRN: [id number] DOB: Nov 02, 1945  05/14/2019  Courtney Wilcox was observed post Covid-19 immunization for 15 minutes without incident. She was provided with Vaccine Information Sheet and instruction to access the V-Safe system.   Courtney Wilcox was instructed to call 911 with any severe reactions post vaccine: Marland Kitchen Difficulty breathing  . Swelling of face and throat  . A fast heartbeat  . A bad rash all over body  . Dizziness and weakness   Immunizations Administered    Name Date Dose VIS Date Route   Pfizer COVID-19 Vaccine 05/14/2019  2:49 PM 0.3 mL 03/12/2018 Intramuscular   Manufacturer: [employer], [employer]   Lot: [id number]   NDC: [address]-6

## 2019-05-20 ENCOUNTER — Ambulatory Visit: Payer: Medicare Other

## 2020-01-18 IMAGING — CT CT HEAD WITHOUT CONTRAST
3 series · 15 of 47 positions shown, 18 images · non-contrast
Comparison: None

CLINICAL DATA: Dizziness over the last 3 days.

EXAM:
CT HEAD WITHOUT CONTRAST
TECHNIQUE: Contiguous axial images were obtained from the base of the skull
through the vertex without intravenous contrast.

[Series 2: head wo · axial · 0.47mm/px · z∈[-136,-11]mm · 9 of 30 slices shown, 12 images]
[im 3/30  brain]
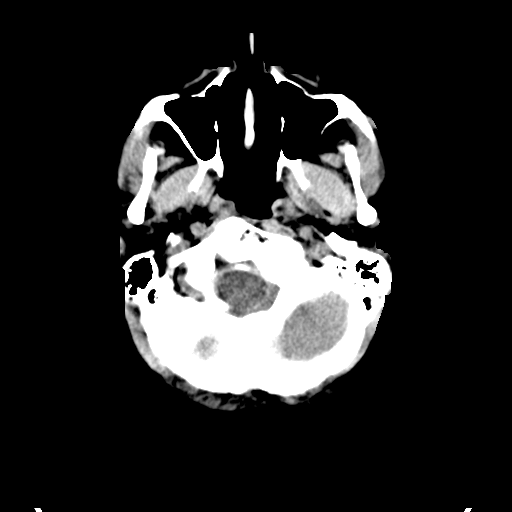
[im 3/30  bone]
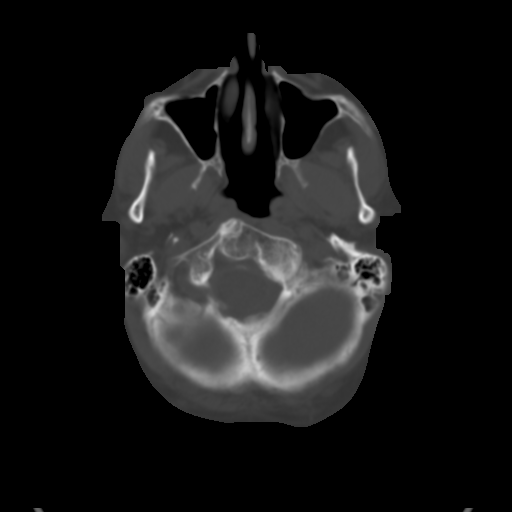
[im 6/30  brain]
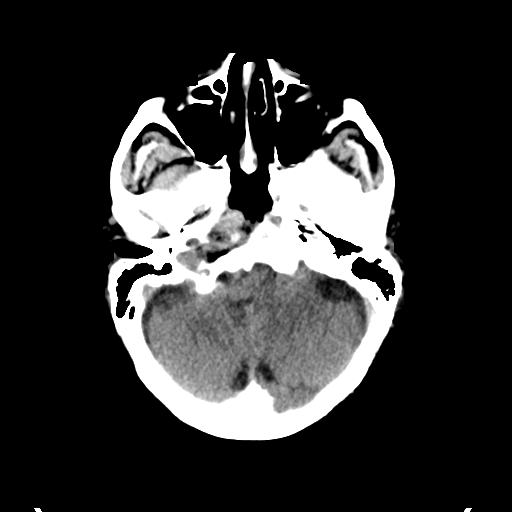
[im 9/30  brain]
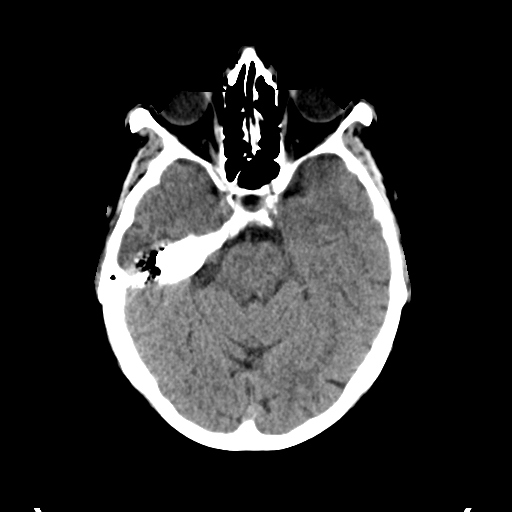
[im 12/30  brain]
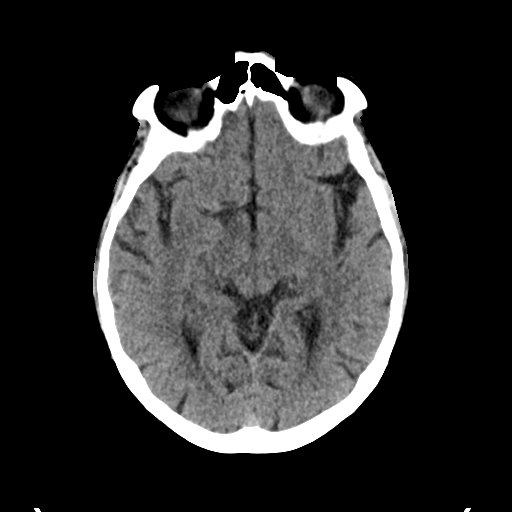
[im 16/30  brain]
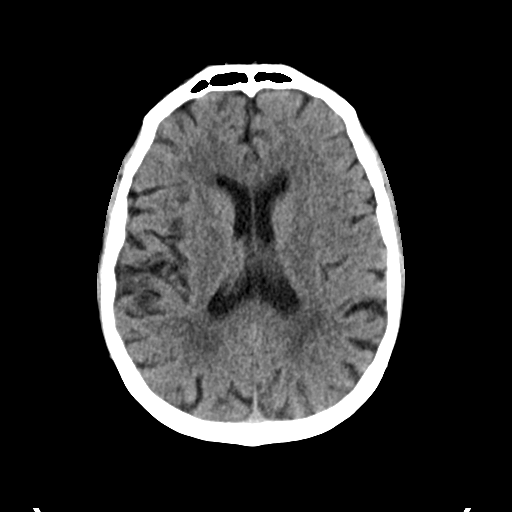
[im 16/30  bone]
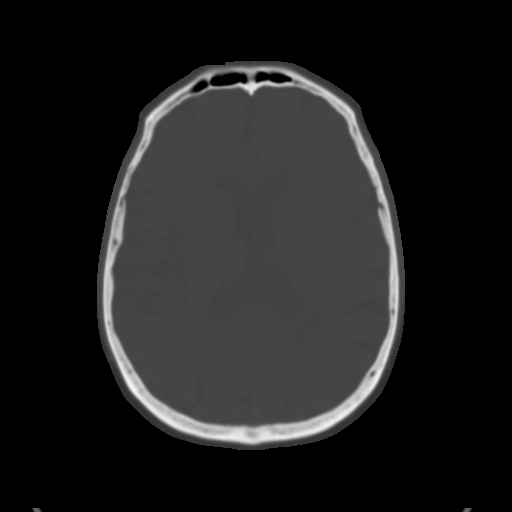
[im 19/30  brain]
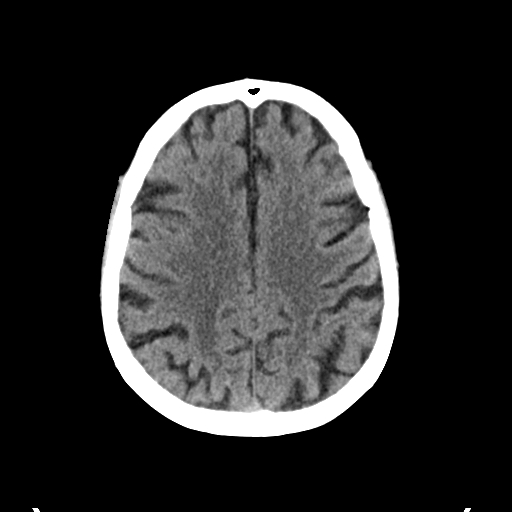
[im 22/30  brain]
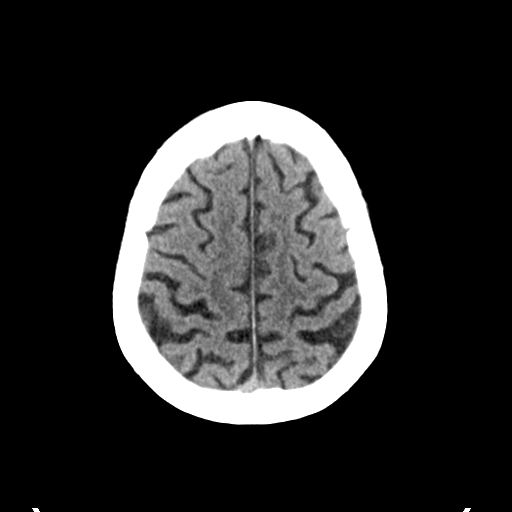
[im 25/30  brain]
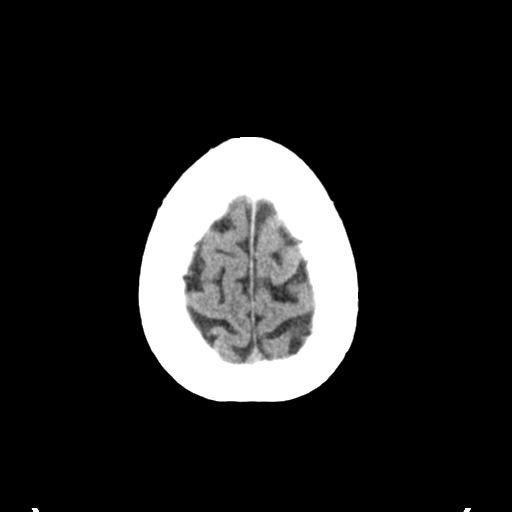
[im 28/30  brain]
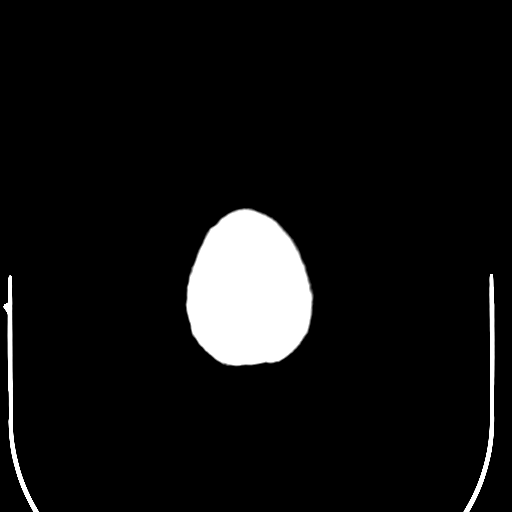
[im 28/30  bone]
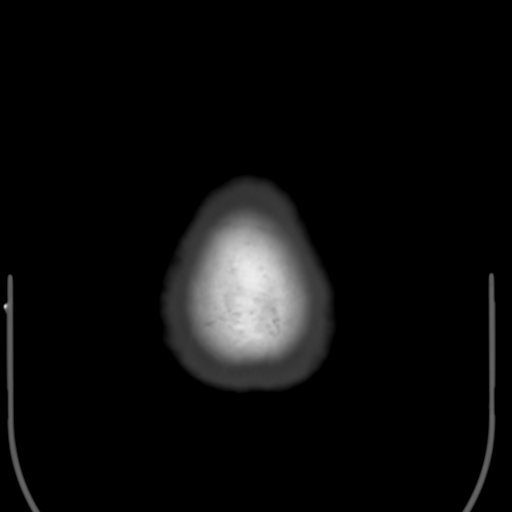

[Series 4: coronal soft tissue · coronal · 0.32mm/px · 3 of 64 slices shown]
[im 22/64  brain]
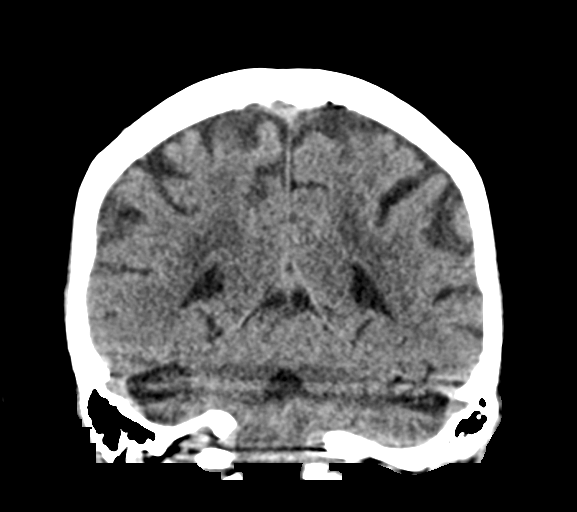
[im 29/64  brain]
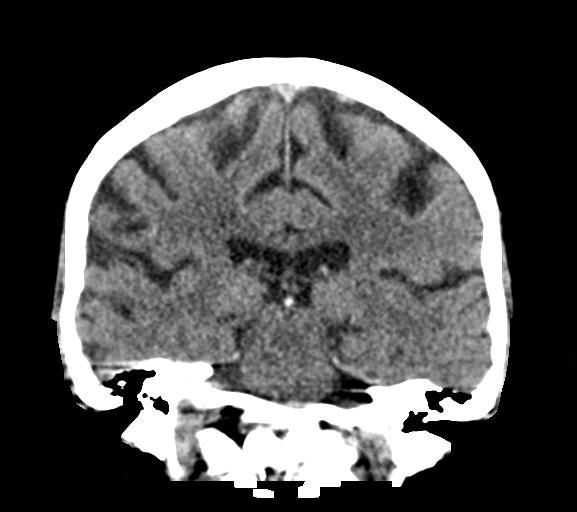
[im 36/64  brain]
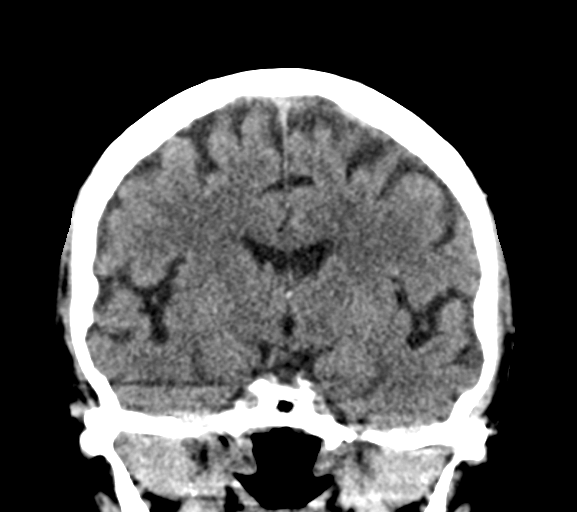

[Series 5: sagittal soft tissue · sagittal · 0.30mm/px · 3 of 52 slices shown]
[im 18/52  brain]
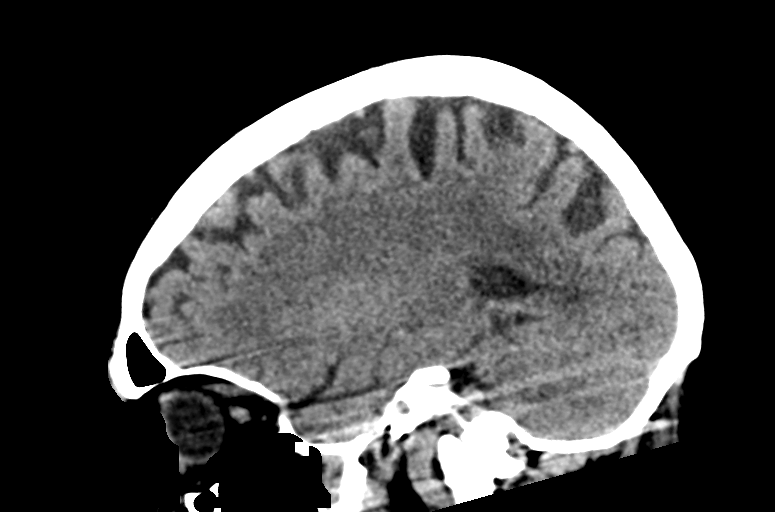
[im 26/52  brain]
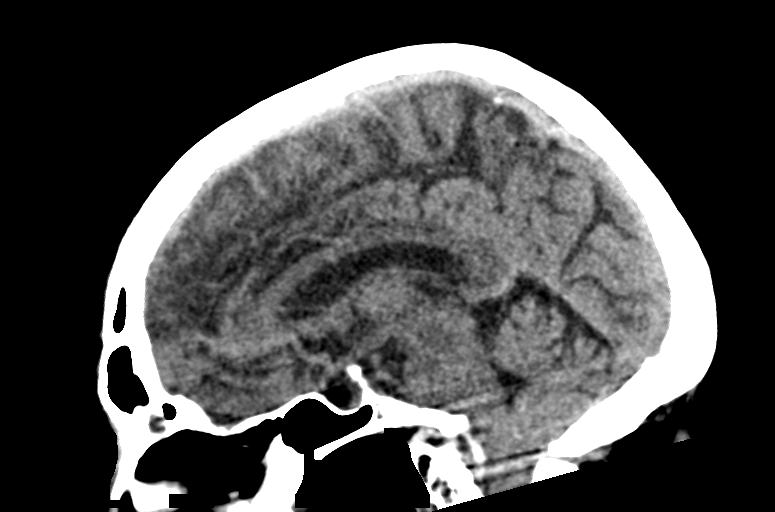
[im 35/52  brain]
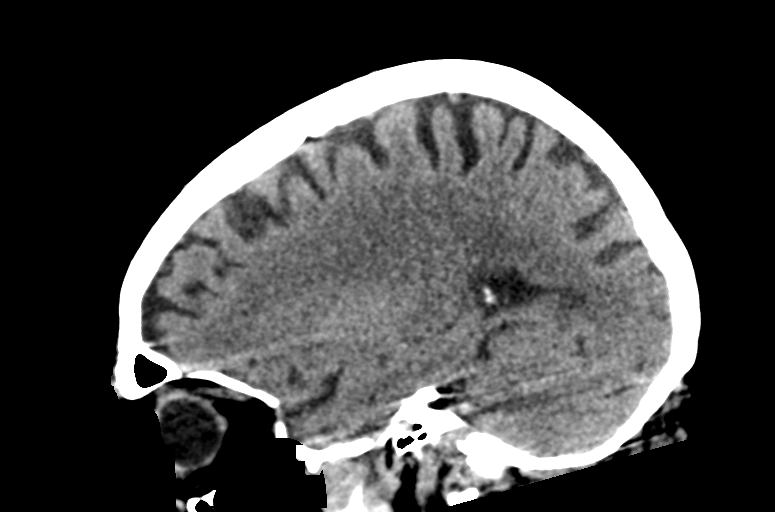

[15 of 47 positions shown; findings below may reference images not displayed]

FINDINGS: Brain: Mild age related atrophy. Mild chronic small-vessel ischemic
change of the cerebral hemispheric white matter. No evidence of
acute or subacute infarction, mass lesion, hemorrhage, hydrocephalus
or extra-axial collection. Patient does have a craniocervical
malformation with cerebellar tonsillar extension through the foramen
magnum, left more than right.

Vascular: No abnormal vascular finding. Ordinary atherosclerotic
calcification of the major vessels at base of the brain.

Skull: Otherwise negative

Sinuses/Orbits: Clear/normal

Other: None
IMPRESSION: No acute finding. Chronic craniocervical malformation as shown
previously. Cerebellar tonsillar extension through the foramen
magnum, left more than right. These findings are all chronic and
would not be expected to relate to a clinical change. Patient does
have generalized age related atrophy and chronic small-vessel
ischemic changes of the hemispheric white matter

## 2020-02-04 DIAGNOSIS — I6529 Occlusion and stenosis of unspecified carotid artery: Secondary | ICD-10-CM | POA: Insufficient documentation

## 2020-02-04 DIAGNOSIS — E785 Hyperlipidemia, unspecified: Secondary | ICD-10-CM | POA: Insufficient documentation

## 2020-02-04 NOTE — Progress Notes (Signed)
MRN : [id number]  Courtney Wilcox is a 75 y.o. (21-Jul-1945) female who presents with chief complaint of blockage in the carotid arteries.  History of Present Illness:   The patient is seen for evaluation of carotid stenosis. The carotid stenosis was identified after a duplex ultrasound of the carotid arteries obtained at [facility] clinic on 01/06/2020.  The patient denies amaurosis fugax. There is no recent history of TIA symptoms or focal motor deficits. There is no prior documented CVA.  There is no history of migraine headaches. There is no history of seizures.  The patient is taking enteric-coated aspirin 81 mg daily.  The patient has a history of coronary artery disease, no recent episodes of angina or shortness of breath. The patient denies PAD or claudication symptoms. There is a history of hyperlipidemia which is being treated with a statin.   Duplex ultrasound from [facility] dated 01/06/2020 is reported as less than 70% bilateral ICA  No outpatient medications have been marked as taking for the 02/05/20 encounter (Appointment) with Gilda Crease, Latina Craver, MD.    Past Medical History:  Diagnosis Date  . Allergic rhinitis   . Anemia   . Anxiety   . Arthritis   . Asthma   . Atrophic vaginitis   . B12 deficiency   . Bronchitis, chronic (HCC)   . Cervical disc disease   . Collagen vascular disease (HCC)    RA  . Depression   . Foot ulcer (HCC)   . GERD (gastroesophageal reflux disease)   . Hammer toe   . Hyperlipidemia   . IBS (irritable bowel syndrome)   . Obesity   . Obstructive sleep apnea   . OSA (obstructive sleep apnea)   . Osteoarthrosis    Multi site  . Osteopenia   . Rheumatoid arthritis (HCC)   . Shingles   . Thyroid nodule    MULTIPLE  . Tonsillitis    recurrent  . Umbilical hernia     Past Surgical History:  Procedure Laterality Date  . BACK SURGERY  2011   LAMINECTOMY  . BREAST BIOPSY Right    neg  . CERVICAL FUSION  2011, 2012   X 2  .  COLONOSCOPY  2005, 2015  . ESOPHAGOGASTRODUODENOSCOPY ENDOSCOPY  2005, 2013, 2015  . EYE SURGERY Bilateral    Cataract Extraction with IOL   . JOINT REPLACEMENT    . KNEE ARTHROPLASTY Left 07/12/2016   Procedure: COMPUTER ASSISTED TOTAL KNEE ARTHROPLASTY;  Surgeon: Donato Heinz, MD;  Location: [facility] ORS;  Service: Orthopedics;  Laterality: Left;  . Laryngeal tear after intubation w/repair    . LUMBAR DISC SURGERY  12/11  . REPLACEMENT TOTAL KNEE Right 06/2007   DR. HOOTEN, [facility]  . REPLACEMENT UNICONDYLAR JOINT KNEE     DR. HOOTEN, [facility]  . SHOULDER ARTHROSCOPY WITH ROTATOR CUFF REPAIR Right   . TONSILLECTOMY    . TOTAL HIP ARTHROPLASTY Right 01/31/2017   Procedure: TOTAL HIP ARTHROPLASTY;  Surgeon: Donato Heinz, MD;  Location: [facility] ORS;  Service: Orthopedics;  Laterality: Right;  . TUBAL LIGATION      Social History Social History   Tobacco Use  . Smoking status: Never Smoker  . Smokeless tobacco: Never Used  Vaping Use  . Vaping Use: Never used  Substance Use Topics  . Alcohol use: No  . Drug use: No    Family History Family History  Problem Relation Age of Onset  . Asthma Mother   . Breast cancer Mother  65  . Emphysema Father        smoked  . Heart disease Father   . Lung cancer Father        smoked  No family history of bleeding/clotting disorders, porphyria or autoimmune disease   Allergies  Allergen Reactions  . Lactose Other (See Comments) and Diarrhea  . Other Other (See Comments)    Pt ONLY tolerates SLOW-FE (slow released iron)---upsets IBS  . Biaxin [Clarithromycin] Other (See Comments)    GI upset   . Hydrocodone-Acetaminophen Itching    Tolerates acetaminophen   . Oxycodone Itching  . Sulfasalazine Diarrhea     REVIEW OF SYSTEMS (Negative unless checked)  Constitutional: [] Weight loss  [] Fever  [] Chills Cardiac: [] Chest pain   [] Chest pressure   [] Palpitations   [] Shortness of breath when laying flat   [] Shortness of breath with  exertion. Vascular:  [] Pain in legs with walking   [] Pain in legs at rest  [] History of DVT   [] Phlebitis   [] Swelling in legs   [] Varicose veins   [] Non-healing ulcers Pulmonary:   [] Uses home oxygen   [] Productive cough   [] Hemoptysis   [] Wheeze  [] COPD   [] Asthma Neurologic:  [] Dizziness   [] Seizures   [] History of stroke   [] History of TIA  [] Aphasia   [] Vissual changes   [] Weakness or numbness in arm   [] Weakness or numbness in leg Musculoskeletal:   [] Joint swelling   [] Joint pain   [] Low back pain Hematologic:  [] Easy bruising  [] Easy bleeding   [] Hypercoagulable state   [] Anemic Gastrointestinal:  [] Diarrhea   [] Vomiting  [] Gastroesophageal reflux/heartburn   [] Difficulty swallowing. Genitourinary:  [] Chronic kidney disease   [] Difficult urination  [] Frequent urination   [] Blood in urine Skin:  [] Rashes   [] Ulcers  Psychological:  [] History of anxiety   []  History of major depression.  Physical Examination  There were no vitals filed for this visit. There is no height or weight on file to calculate BMI. Gen: WD/WN, NAD Head: [place]/AT, No temporalis wasting.  Ear/Nose/Throat: Hearing grossly intact, nares w/o erythema or drainage, poor dentition Eyes: PER, EOMI, sclera nonicteric.  Neck: Supple, no masses.  No bruit or JVD.  Pulmonary:  Good air movement, clear to auscultation bilaterally, no use of accessory muscles.  Cardiac: RRR, normal S1, S2, no Murmurs. Vascular: bilateral carotid bruits Vessel Right Left  Radial Palpable Palpable  Carotid Palpable Palpable  PT Palpable Palpable  DP Palpable Palpable  Gastrointestinal: soft, non-distended. No guarding/no peritoneal signs.  Musculoskeletal: M/S 5/5 throughout.  No deformity or atrophy.  Neurologic: CN 2-12 intact. Pain and light touch intact in extremities.  Symmetrical.  Speech is fluent. Motor exam as listed above. Psychiatric: Judgment intact, Mood & affect appropriate for pt's clinical situation. Dermatologic: No rashes  or ulcers noted.  No changes consistent with cellulitis. Lymph : No Cervical lymphadenopathy, no lichenification or skin changes of chronic lymphedema.  CBC Lab Results  Component Value Date   WBC 3.5 (L) 06/14/2017   HGB 12.8 06/14/2017   HCT 37.1 06/14/2017   MCV 92.9 06/14/2017   PLT 166 06/14/2017    BMET    Component Value Date/Time   NA 142 06/14/2017 1513   K 3.2 (L) 06/14/2017 1513   CL 108 06/14/2017 1513   CO2 25 06/14/2017 1513   GLUCOSE 88 06/14/2017 1513   BUN 16 06/14/2017 1513   CREATININE 0.87 06/14/2017 1513   CALCIUM 8.8 (L) 06/14/2017 1513   GFRNONAA >60 06/14/2017 1513  GFRAA >60 06/14/2017 1513   CrCl cannot be calculated (Patient's most recent lab result is older than the maximum 21 days allowed.).  COAG Lab Results  Component Value Date   INR 0.97 01/25/2017   INR 0.96 06/28/2016    Radiology No results found.   Assessment/Plan 1. Bilateral carotid artery stenosis Recommend:  Given the patient's asymptomatic subcritical stenosis no further invasive testing or surgery at this time.  Duplex ultrasound shows <70% stenosis bilaterally.  Continue antiplatelet therapy as prescribed Continue management of CAD, HTN and Hyperlipidemia Healthy heart diet,  encouraged exercise at least 4 times per week Follow up in 6 months with duplex ultrasound and physical exam  - VAS US CAROTID; Future  2. Mild asthma without complication, unspecified whether persistent Continue pulmonary medications and aerosols as already ordered, these medications have been reviewed and there are no changes at this time.    3. Spinal stenosis of lumbar region with neurogenic claudication Continue NSAID medications as already ordered, these medications have been reviewed and there are no changes at this time.  Continued activity and therapy was stressed.   4. Mixed hyperlipidemia Continue statin as ordered and reviewed, no changes at this time     Levora Dredge, MD  02/04/2020 3:25 PM

## 2020-02-05 ENCOUNTER — Ambulatory Visit (INDEPENDENT_AMBULATORY_CARE_PROVIDER_SITE_OTHER): Payer: Medicare Other | Admitting: Vascular Surgery

## 2020-02-05 ENCOUNTER — Other Ambulatory Visit: Payer: Self-pay

## 2020-02-05 ENCOUNTER — Encounter (INDEPENDENT_AMBULATORY_CARE_PROVIDER_SITE_OTHER): Payer: Self-pay | Admitting: Vascular Surgery

## 2020-02-05 VITALS — BP 137/86 | HR 86 | Ht 61.0 in | Wt 193.0 lb

## 2020-02-05 DIAGNOSIS — E782 Mixed hyperlipidemia: Secondary | ICD-10-CM | POA: Diagnosis not present

## 2020-02-05 DIAGNOSIS — J45909 Unspecified asthma, uncomplicated: Secondary | ICD-10-CM | POA: Diagnosis not present

## 2020-02-05 DIAGNOSIS — I6523 Occlusion and stenosis of bilateral carotid arteries: Secondary | ICD-10-CM

## 2020-02-05 DIAGNOSIS — M48062 Spinal stenosis, lumbar region with neurogenic claudication: Secondary | ICD-10-CM | POA: Diagnosis not present

## 2020-02-20 ENCOUNTER — Encounter (INDEPENDENT_AMBULATORY_CARE_PROVIDER_SITE_OTHER): Payer: Self-pay | Admitting: Vascular Surgery

## 2020-04-04 ENCOUNTER — Emergency Department: Payer: Medicare Other

## 2020-04-04 ENCOUNTER — Other Ambulatory Visit: Payer: Self-pay

## 2020-04-04 ENCOUNTER — Emergency Department
Admission: EM | Admit: 2020-04-04 | Discharge: 2020-04-04 | Disposition: A | Discharge status: Home / Self Care | Payer: Medicare Other | Attending: Emergency Medicine | Admitting: Emergency Medicine

## 2020-04-04 DIAGNOSIS — W01198A Fall on same level from slipping, tripping and stumbling with subsequent striking against other object, initial encounter: Secondary | ICD-10-CM | POA: Insufficient documentation

## 2020-04-04 DIAGNOSIS — S0993XA Unspecified injury of face, initial encounter: Secondary | ICD-10-CM | POA: Insufficient documentation

## 2020-04-04 DIAGNOSIS — S6992XA Unspecified injury of left wrist, hand and finger(s), initial encounter: Secondary | ICD-10-CM | POA: Diagnosis present

## 2020-04-04 DIAGNOSIS — S63259A Unspecified dislocation of unspecified finger, initial encounter: Secondary | ICD-10-CM

## 2020-04-04 DIAGNOSIS — Z79899 Other long term (current) drug therapy: Secondary | ICD-10-CM | POA: Diagnosis not present

## 2020-04-04 DIAGNOSIS — Z96653 Presence of artificial knee joint, bilateral: Secondary | ICD-10-CM | POA: Diagnosis not present

## 2020-04-04 DIAGNOSIS — S62617A Displaced fracture of proximal phalanx of left little finger, initial encounter for closed fracture: Secondary | ICD-10-CM | POA: Insufficient documentation

## 2020-04-04 DIAGNOSIS — Z96641 Presence of right artificial hip joint: Secondary | ICD-10-CM | POA: Diagnosis not present

## 2020-04-04 DIAGNOSIS — I6782 Cerebral ischemia: Secondary | ICD-10-CM | POA: Diagnosis not present

## 2020-04-04 DIAGNOSIS — Y9302 Activity, running: Secondary | ICD-10-CM | POA: Diagnosis not present

## 2020-04-04 DIAGNOSIS — Y92007 Garden or yard of unspecified non-institutional (private) residence as the place of occurrence of the external cause: Secondary | ICD-10-CM | POA: Insufficient documentation

## 2020-04-04 DIAGNOSIS — J45909 Unspecified asthma, uncomplicated: Secondary | ICD-10-CM | POA: Insufficient documentation

## 2020-04-04 DIAGNOSIS — W19XXXA Unspecified fall, initial encounter: Secondary | ICD-10-CM

## 2020-04-04 MED ORDER — HYDROCODONE-ACETAMINOPHEN 5-325 MG PO TABS: 1.0000 | ORAL_TABLET | Freq: Four times a day (QID) | ORAL | 0 refills | Status: AC | PRN

## 2020-04-04 MED ORDER — HYDROCODONE-ACETAMINOPHEN 5-325 MG PO TABS
1.0000 | ORAL_TABLET | Freq: Once | ORAL | Status: AC
Start: 2020-04-04 — End: 2020-04-04
  Administered 2020-04-04: 1 via ORAL
  Filled 2020-04-04: qty 1

## 2020-04-04 MED ORDER — LIDOCAINE HCL 1 % IJ SOLN
5.0000 mL | Freq: Once | INTRAMUSCULAR | Status: AC
  Administered 2020-04-04: 5 mL
  Filled 2020-04-04: qty 10

## 2020-04-04 MED ORDER — HYDROXYZINE HCL 10 MG PO TABS
10.0000 mg | ORAL_TABLET | Freq: Once | ORAL | Status: DC
  Filled 2020-04-04: qty 1

## 2020-04-04 MED ORDER — ONDANSETRON 4 MG PO TBDP
4.0000 mg | ORAL_TABLET | Freq: Once | ORAL | Status: AC
  Administered 2020-04-04: 4 mg via ORAL
  Filled 2020-04-04: qty 1

## 2020-04-04 MED ORDER — ONDANSETRON 4 MG PO TBDP: 4.0000 mg | ORAL_TABLET | Freq: Three times a day (TID) | ORAL | 0 refills | Status: AC | PRN

## 2020-04-04 NOTE — ED Triage Notes (Addendum)
Pt states she had a fall in her yard chasing a little tikes car- pt states she tripped and fell forward- pt having pain in her left pinky- obvious deformity noted, pinky folded on top of ring finger- pt states she fell face first but is not having pain in her head- does not take blood thinners

## 2020-04-04 NOTE — Discharge Instructions (Signed)
Take Norco as needed for pain. Please make follow-up appointment with Dr. Ernest Pine. Please wear finger splint until evaluated by Ortho.

## 2020-04-04 NOTE — ED Provider Notes (Signed)
[facility]EMERGENCY DEPARTMENT  ____________________________________________  Time seen: Approximately 8:20 PM  I have reviewed the triage vital signs and the nursing notes.   HISTORY  Chief Complaint Fall   Historian Patient     HPI Courtney Wilcox is a 75 y.o. female presents to the emergency department with acute left hand pain.  Patient was chasing a take scar in her yard and tripped in her grass, falling forward.  She states that she did hit her face but did not lose consciousness.  She has a deformity of the left fifth digit.  She denies left wrist pain.  No chest pain, chest tightness or abdominal pain.  Patient has been able to ambulate easily since fall occurred.  Patient has small abrasion at superior aspect of nose but no lacerations.  No other alleviating measures have been attempted.   Past Medical History:  Diagnosis Date  . Allergic rhinitis   . Anemia   . Anxiety   . Arthritis   . Asthma   . Atrophic vaginitis   . B12 deficiency   . Bronchitis, chronic (HCC)   . Cervical disc disease   . Collagen vascular disease (HCC)    RA  . Depression   . Foot ulcer (HCC)   . GERD (gastroesophageal reflux disease)   . Hammer toe   . Hyperlipidemia   . IBS (irritable bowel syndrome)   . Obesity   . Obstructive sleep apnea   . OSA (obstructive sleep apnea)   . Osteoarthrosis    Multi site  . Osteopenia   . Rheumatoid arthritis (HCC)   . Shingles   . Thyroid nodule    MULTIPLE  . Tonsillitis    recurrent  . Umbilical hernia      Immunizations up to date:  Yes.     Past Medical History:  Diagnosis Date  . Allergic rhinitis   . Anemia   . Anxiety   . Arthritis   . Asthma   . Atrophic vaginitis   . B12 deficiency   . Bronchitis, chronic (HCC)   . Cervical disc disease   . Collagen vascular disease (HCC)    RA  . Depression   . Foot ulcer (HCC)   . GERD (gastroesophageal reflux disease)   . Hammer toe   . Hyperlipidemia   . IBS (irritable  bowel syndrome)   . Obesity   . Obstructive sleep apnea   . OSA (obstructive sleep apnea)   . Osteoarthrosis    Multi site  . Osteopenia   . Rheumatoid arthritis (HCC)   . Shingles   . Thyroid nodule    MULTIPLE  . Tonsillitis    recurrent  . Umbilical hernia     Patient Active Problem List   Diagnosis Date Noted  . Carotid stenosis 02/04/2020  . Hyperlipidemia 02/04/2020  . Multiple thyroid nodules 05/21/2018  . Thrombocytopenia (HCC) 07/31/2017  . 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/2017  . 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  . Primary osteoarthritis of right hip 12/13/2016  . S/P total knee arthroplasty 07/12/2016  . Anemia 10/30/2013  . Anxiety 10/30/2013  . Osteoarthritis 07/08/2013  . Osteopenia 07/08/2013  . Asthma, mild 02/19/2013  . Chest pain 02/19/2013  . Rheumatoid arthritis (HCC) 10/30/2012  . Shortness of breath 10/29/2012  . Spinal stenosis in cervical region 10/29/2012    Past Surgical History:  Procedure Laterality Date  . BACK SURGERY  2011   LAMINECTOMY  . BREAST BIOPSY Right  neg  . CERVICAL FUSION  2011, 2012   X 2  . COLONOSCOPY  2005, 2015  . ESOPHAGOGASTRODUODENOSCOPY ENDOSCOPY  2005, 2013, 2015  . EYE SURGERY Bilateral    Cataract Extraction with IOL   . JOINT REPLACEMENT    . KNEE ARTHROPLASTY Left 07/12/2016   Procedure: COMPUTER ASSISTED TOTAL KNEE ARTHROPLASTY;  Surgeon: Donato Heinz, MD;  Location: [facility] ORS;  Service: Orthopedics;  Laterality: Left;  . Laryngeal tear after intubation w/repair    . LUMBAR DISC SURGERY  12/11  . REPLACEMENT TOTAL KNEE Right 06/2007   DR. HOOTEN, [facility]  . REPLACEMENT UNICONDYLAR JOINT KNEE     DR. HOOTEN, [facility]  . SHOULDER ARTHROSCOPY WITH ROTATOR CUFF REPAIR Right   . TONSILLECTOMY    . TOTAL HIP ARTHROPLASTY Right 01/31/2017   Procedure: TOTAL HIP ARTHROPLASTY;  Surgeon: Donato Heinz, MD;  Location: [facility] ORS;  Service: Orthopedics;  Laterality:  Right;  . TUBAL LIGATION      Prior to Admission medications   Medication Sig Start Date End Date Taking? Authorizing Provider  acetaminophen (TYLENOL) 500 MG tablet Take 1,000 mg by mouth 3 (three) times daily as needed for mild pain.     [provider]  Adalimumab (HUMIRA PEN) 40 MG/0.4ML PNKT Inject into the skin. 04/08/19   [provider]  ALPRAZolam Prudy Feeler) 0.5 MG tablet Take 0.5 mg by mouth at bedtime. 06/15/16   [provider]  budesonide-formoterol (SYMBICORT) 160-4.5 MCG/ACT inhaler Inhale 2 puffs into the lungs 2 (two) times daily as needed.    [provider]  Cholecalciferol (VITAMIN D) 2000 units tablet Take 4,000 Units by mouth daily.     [provider]  citalopram (CELEXA) 20 MG tablet Take 20 mg by mouth every evening.  06/15/16   [provider]  dicyclomine (BENTYL) 10 MG capsule Take by mouth. 11/14/19 11/13/20  [provider]  enoxaparin (LOVENOX) 30 MG/0.3ML injection Inject 0.3 mLs (30 mg total) into the skin every 12 (twelve) hours for 14 days. 02/03/17 02/17/17  Tera Partridge, PA  estradiol (ESTRACE) 0.1 MG/GM vaginal cream Insert pea size amount vaginally nightly x 2 weeks, then every other night x 2 weeks, then 2-3 times weekly for maintenance 06/18/18   [provider]  famotidine (PEPCID) 20 MG tablet Take by mouth.    [provider]  fluticasone (FLONASE) 50 MCG/ACT nasal spray Place 1-2 sprays into the nose daily.    [provider]  fluticasone furoate-vilanterol (BREO ELLIPTA) 100-25 MCG/INH AEPB Inhale into the lungs. 06/25/19   [provider]  hydroxychloroquine (PLAQUENIL) 200 MG tablet Take 1 tablet (200 mg total) by mouth daily. Patient taking differently: Take 200 mg by mouth daily with lunch. 07/21/16   Tera Partridge, PA  ibuprofen (ADVIL,MOTRIN) 200 MG tablet Take 400 mg by mouth every 6 (six) hours as needed (for pain.).    [provider]  leflunomide  (ARAVA) 20 MG tablet Take 1 tablet by mouth daily. 09/27/17   [provider]  meclizine (ANTIVERT) 12.5 MG tablet PLEASE SEE ATTACHED FOR DETAILED DIRECTIONS 10/05/18   [provider]  meloxicam (MOBIC) 7.5 MG tablet Take 7.5-15 mg by mouth See admin instructions. Take 1 tablet (7.5 mg) twice daily or 2 tablets (15 mg) in the evening. 05/11/16   [provider]  metoprolol succinate (TOPROL-XL) 25 MG 24 hr tablet Take 1 tablet by mouth daily. 09/21/17   [provider]  metoprolol tartrate (LOPRESSOR) 25  MG tablet Take 1 tablet (25 mg total) by mouth 2 (two) times daily. 06/14/17 06/14/18  Arnaldo Natal, MD  montelukast (SINGULAIR) 10 MG tablet Take 10 mg by mouth daily as needed (for allergies.).     [provider]  Multiple Vitamins-Minerals (CENTRUM SILVER 50+WOMEN PO) Take by mouth.    [provider]  naproxen sodium (ALEVE) 220 MG tablet Take 220 mg by mouth 2 (two) times daily as needed (for pain.).    [provider]  nystatin (MYCOSTATIN) 100000 UNIT/ML suspension Take 1 mL by mouth 4 (four) times daily as needed. For thrush/mouth sores. 11/14/16   [provider]  omeprazole (PRILOSEC) 20 MG capsule Take 20 mg by mouth daily before breakfast.    [provider]  Potassium 99 MG TABS Take 99 mg by mouth daily with lunch.     [provider]  potassium chloride (KLOR-CON) 10 MEQ tablet Take by mouth. 06/26/19   [provider]  pseudoephedrine (SUDAFED) 30 MG tablet Take 30 mg by mouth every 4 (four) hours as needed for congestion.    [provider]  ranitidine (ZANTAC) 150 MG tablet Take 150 mg by mouth 2 (two) times daily before a meal. Before lunch and before supper.    [provider]  Simethicone 180 MG CAPS Take 1-2 capsules by mouth as needed.    [provider]  Spacer/Aero-Holding Chambers (E-Z SPACER) inhaler Use as instructed 02/19/13   Lupita Leash, MD   tapentadol (NUCYNTA) 50 MG tablet Take 1 tablet (50 mg total) by mouth every 4 (four) hours as needed for moderate pain. 02/01/17   Tera Partridge, PA  traMADol (ULTRAM) 50 MG tablet Take 1-2 tablets (50-100 mg total) by mouth every 4 (four) hours as needed for moderate pain. 02/01/17   Tera Partridge, PA    Allergies Lactose, Other, Biaxin [clarithromycin], Hydrocodone-acetaminophen, Oxycodone, Sulfa antibiotics, and Sulfasalazine  Family History  Problem Relation Age of Onset  . Asthma Mother   . Breast cancer Mother 96  . Emphysema Father        smoked  . Heart disease Father   . Lung cancer Father        smoked    Social History Social History   Tobacco Use  . Smoking status: Never Smoker  . Smokeless tobacco: Never Used  Vaping Use  . Vaping Use: Never used  Substance Use Topics  . Alcohol use: No  . Drug use: No     Review of Systems  Constitutional: No fever/chills Eyes:  No discharge ENT: Patient has facial pain.  Respiratory: no cough. No SOB/ use of accessory muscles to breath Gastrointestinal:   No nausea, no vomiting.  No diarrhea.  No constipation. Musculoskeletal: Patient has left fifth digit pain.  Skin: Negative for rash, abrasions, lacerations, ecchymosis.   ____________________________________________   PHYSICAL EXAM:  VITAL SIGNS: ED Triage Vitals [04/04/20 1642]  Enc Vitals Group     BP (!) 134/114     Pulse Rate (!) 58     Resp 18     Temp 98.1 F (36.7 C)     Temp Source Oral     SpO2 99 %     Weight 194 lb (88 kg)     Height 5' (1.524 m)     Head Circumference      Peak Flow      Pain Score 9     Pain Loc  Pain Edu?      Excl. in GC?      Constitutional: Alert and oriented. Well appearing and in no acute distress. Eyes: Conjunctivae are normal. PERRL. EOMI. Head: Atraumatic. ENT:      Nose: No congestion/rhinnorhea.      Mouth/Throat: Mucous membranes are moist.  Neck: No stridor.  FROM.  Cardiovascular: Normal rate,  regular rhythm. Normal S1 and S2.  Good peripheral circulation. Respiratory: Normal respiratory effort without tachypnea or retractions. Lungs CTAB. Good air entry to the bases with no decreased or absent breath sounds Gastrointestinal: Bowel sounds x 4 quadrants. Soft and nontender to palpation. No guarding or rigidity. No distention. Musculoskeletal: Full range of motion to all extremities. No obvious deformities noted.  Patient performs limited range of motion at the left fifth digit.  Palpable radial pulse, left.  Capillary refill less than 2 seconds on the left. Neurologic:  Normal for age. No gross focal neurologic deficits are appreciated.  Skin:  Skin is warm, dry and intact. No rash noted. Psychiatric: Mood and affect are normal for age. Speech and behavior are normal.   ____________________________________________   LABS (all labs ordered are listed, but only abnormal results are displayed)  Labs Reviewed - No data to display ____________________________________________  EKG   ____________________________________________  RADIOLOGY Geraldo Pitter, personally viewed and evaluated these images (plain radiographs) as part of my medical decision making, as well as reviewing the written report by the radiologist.  DG Hand Complete Left  Result Date: 04/04/2020 CLINICAL DATA:  Pt states she had a fall in her yard chasing a little tikes car- pt states she tripped and fell forward EXAM: LEFT HAND - COMPLETE 3+ VIEW COMPARISON:  None. FINDINGS: There is a displaced and angulated transverse fracture at the base of the proximal phalanx of the little finger. No definite intra-articular extension. No evidence of dislocation. Osteopenia. Degenerative changes throughout the carpal bones and distal interphalangeal joints. There is bony irregularity at the base of the fifth metacarpal which is favored to be degenerative. Regional soft tissues are unremarkable. IMPRESSION: 1. Displaced and  angulated fracture at the base of the proximal phalanx of the left little finger. 2. Bony irregularity at the base of the fifth metacarpal, favored to be degenerative. Recommend correlation with point tenderness. Electronically Signed   By: Emmaline Kluver M.D.   On: 04/04/2020 18:26   DG Finger Little Left  Result Date: 04/04/2020 CLINICAL DATA:  Postreduction film EXAM: LEFT LITTLE FINGER 2+V COMPARISON:  April 04, 2020 6pm FINDINGS: There is also comminuted nondisplaced fracture at the base of the fifth proximal phalanx. Again noted is slight cortical irregularity at the base of the fifth metacarpal. Overlying splint material is noted. IMPRESSION: Again noted is nondisplaced fracture at the base of the fifth proximal phalanx. Electronically Signed   By: Jonna Clark M.D.   On: 04/04/2020 20:05    ____________________________________________    PROCEDURES  Procedure(s) performed:     Reduction of fracture  Date/Time: 04/04/2020 8:23 PM Performed by: Orvil Feil, PA-C Authorized by: Orvil Feil, PA-C  Consent: Verbal consent obtained. Consent given by: patient Patient identity confirmed: verbally with patient Local anesthesia used: yes Anesthesia: digital block  Anesthesia: Local anesthesia used: yes Local Anesthetic: lidocaine 1% without epinephrine Anesthetic total: 5 mL  Sedation: Patient sedated: no         Medications  hydrOXYzine (ATARAX/VISTARIL) tablet 10 mg (0 mg Oral Hold 04/04/20 1855)  HYDROcodone-acetaminophen (NORCO/VICODIN) 5-325 MG per  tablet 1 tablet (1 tablet Oral Given 04/04/20 1852)  ondansetron (ZOFRAN-ODT) disintegrating tablet 4 mg (4 mg Oral Given 04/04/20 1852)  lidocaine (XYLOCAINE) 1 % (with pres) injection 5 mL (5 mLs Infiltration Given by Other 04/04/20 1938)     ____________________________________________   INITIAL IMPRESSION / ASSESSMENT AND PLAN / ED COURSE  Pertinent labs & imaging results that were available during my  care of the patient were reviewed by me and considered in my medical decision making (see chart for details).  Clinical Course as of 04/04/20 2021  Sun Apr 04, 2020  2020 DG Hand Complete Left [RB]    Clinical Course User Index [RB] Lorre Munroe, NP      Assessment and plan 75 year old female presents to the emergency department with acute left hand pain after mechanical fall.  Patient was hypertensive and mildly bradycardic at triage.  She had an initial deformity visualized of the left fifth digit.  Initial x-ray showed a displaced and angulated proximal phalanx fracture.  Patient's digit was reduced in the emergency department and post reduction films showed significantly improved alignment.  Patient's left fifth digit was splinted into extension.  Patient was given Norco in the emergency department for pain and tolerated medication well.  CT head shows no evidence of intracranial bleed or skull fracture.  Patient was given a prescription for Norco for pain and advised to follow-up with her orthopedist, Dr. Ernest Pine.  Return precautions were given to return with new or worsening symptoms.   ____________________________________________  FINAL CLINICAL IMPRESSION(S) / ED DIAGNOSES  Final diagnoses:  Fall      NEW MEDICATIONS STARTED DURING THIS VISIT:  ED Discharge Orders    None          This chart was dictated using voice recognition software/Dragon. Despite best efforts to proofread, errors can occur which can change the meaning. Any change was purely unintentional.     Orvil Feil, PA-C 04/04/20 2128    Gilles Chiquito, MD 04/05/20 [phone]

## 2020-06-18 ENCOUNTER — Other Ambulatory Visit (HOSPITAL_COMMUNITY): Payer: Self-pay | Admitting: Physician Assistant

## 2020-06-18 ENCOUNTER — Other Ambulatory Visit: Payer: Self-pay | Admitting: Physician Assistant

## 2020-06-18 DIAGNOSIS — R1032 Left lower quadrant pain: Secondary | ICD-10-CM

## 2020-06-24 ENCOUNTER — Other Ambulatory Visit: Payer: Self-pay | Admitting: Gastroenterology

## 2020-06-24 DIAGNOSIS — R194 Change in bowel habit: Secondary | ICD-10-CM

## 2020-06-24 DIAGNOSIS — R1032 Left lower quadrant pain: Secondary | ICD-10-CM

## 2020-06-24 DIAGNOSIS — D61818 Other pancytopenia: Secondary | ICD-10-CM

## 2020-06-25 ENCOUNTER — Other Ambulatory Visit: Payer: Self-pay

## 2020-06-25 ENCOUNTER — Ambulatory Visit
Admission: RE | Admit: 2020-06-25 | Discharge: 2020-06-25 | Disposition: A | Discharge status: Home / Self Care | Payer: Medicare Other | Source: Ambulatory Visit | Attending: Gastroenterology | Admitting: Gastroenterology

## 2020-06-25 DIAGNOSIS — R194 Change in bowel habit: Secondary | ICD-10-CM | POA: Diagnosis present

## 2020-06-25 DIAGNOSIS — D61818 Other pancytopenia: Secondary | ICD-10-CM | POA: Diagnosis present

## 2020-06-25 DIAGNOSIS — R1032 Left lower quadrant pain: Secondary | ICD-10-CM | POA: Diagnosis not present

## 2020-07-07 ENCOUNTER — Ambulatory Visit: Payer: Medicare Other

## 2020-07-08 ENCOUNTER — Inpatient Hospital Stay: Payer: Medicare Other

## 2020-07-08 ENCOUNTER — Inpatient Hospital Stay: Payer: Medicare Other | Admitting: Nurse Practitioner

## 2020-07-15 ENCOUNTER — Inpatient Hospital Stay: Payer: Medicare Other | Attending: Nurse Practitioner | Admitting: Nurse Practitioner

## 2020-07-15 ENCOUNTER — Encounter: Payer: Self-pay | Admitting: Nurse Practitioner

## 2020-07-15 ENCOUNTER — Inpatient Hospital Stay: Payer: Medicare Other

## 2020-07-15 VITALS — BP 140/90 | HR 90 | Temp 97.3°F | Resp 19 | Ht 61.2 in | Wt 194.6 lb

## 2020-07-15 DIAGNOSIS — Z803 Family history of malignant neoplasm of breast: Secondary | ICD-10-CM

## 2020-07-15 DIAGNOSIS — K582 Mixed irritable bowel syndrome: Secondary | ICD-10-CM | POA: Insufficient documentation

## 2020-07-15 DIAGNOSIS — E538 Deficiency of other specified B group vitamins: Secondary | ICD-10-CM

## 2020-07-15 DIAGNOSIS — Z862 Personal history of diseases of the blood and blood-forming organs and certain disorders involving the immune mechanism: Secondary | ICD-10-CM

## 2020-07-15 DIAGNOSIS — D649 Anemia, unspecified: Secondary | ICD-10-CM

## 2020-07-15 DIAGNOSIS — D61818 Other pancytopenia: Secondary | ICD-10-CM

## 2020-07-15 DIAGNOSIS — Z801 Family history of malignant neoplasm of trachea, bronchus and lung: Secondary | ICD-10-CM | POA: Insufficient documentation

## 2020-07-15 DIAGNOSIS — R7402 Elevation of levels of lactic acid dehydrogenase (LDH): Secondary | ICD-10-CM | POA: Diagnosis not present

## 2020-07-15 DIAGNOSIS — Z836 Family history of other diseases of the respiratory system: Secondary | ICD-10-CM

## 2020-07-15 DIAGNOSIS — M549 Dorsalgia, unspecified: Secondary | ICD-10-CM

## 2020-07-15 DIAGNOSIS — R197 Diarrhea, unspecified: Secondary | ICD-10-CM

## 2020-07-15 DIAGNOSIS — Z8249 Family history of ischemic heart disease and other diseases of the circulatory system: Secondary | ICD-10-CM

## 2020-07-15 DIAGNOSIS — K5900 Constipation, unspecified: Secondary | ICD-10-CM

## 2020-07-15 LAB — IRON AND TIBC
Iron: 52 ug/dL (ref 28–170)
Saturation Ratios: 17 % (ref 10.4–31.8)
TIBC: 314 ug/dL (ref 250–450)
UIBC: 262 ug/dL

## 2020-07-15 LAB — FOLATE: Folate: 7.2 ng/mL (ref >5.9)

## 2020-07-15 LAB — HEPATITIS B SURFACE ANTIGEN: Hepatitis B Surface Ag: NONREACTIVE

## 2020-07-15 LAB — FIBRINOGEN: Fibrinogen: 293 mg/dL (ref 210–475)

## 2020-07-15 LAB — DAT, POLYSPECIFIC AHG (ARMC ONLY): Polyspecific AHG test: NEGATIVE

## 2020-07-15 LAB — VITAMIN B12: Vitamin B-12: 575 pg/mL (ref 180–914)

## 2020-07-15 LAB — FERRITIN: Ferritin: 23 ng/mL (ref 11–307)

## 2020-07-15 LAB — LACTATE DEHYDROGENASE: LDH: 220 U/L — ABNORMAL HIGH (ref 98–192)

## 2020-07-15 LAB — SEDIMENTATION RATE: Sed Rate: 16 mm/hr (ref 0–30)

## 2020-07-15 NOTE — Progress Notes (Signed)
[facility] [address], [address] [place], [place] [address] [phone] (phone) [phone] (fax)  Clinic Day:  07/15/2020  Referring physician: Freda Munro*  CHIEF COMPLAINT:  CC: Pancytopenia  HISTORY OF PRESENT ILLNESS:  JAYLN BRANSCOM is a 75 y.o. female with a history of cad, asthma, RA, osteoarthritis, DDD, HLD, anemia, anxiety, thrombocytopenia, IBS, who is referred in consultation with {Christiane Kathryne Hitch, [place] for assessment and management of pancytopenia.  Patient is followed for IBS and was seen by GI for ongoing symptoms.  Reports changes in her bowel movements which prompted blood work.  CBC showed pancytopenia.  CT was performed for GI symptoms and was negative.  Per patient she has a long history of anemia which was thought to be secondary to her IBS.  Believes she has had low platelets in the past.  Unaware of low white blood cell count.  She generally feels well other than arthritis and GI complaints. Denies any neurologic complaints. Denies recent fevers or illnesses. Denies any easy bleeding or bruising. No melena or hematochezia. No pica or restless leg. Reports good appetite and denies weight loss. Denies chest pain. Denies any nausea, vomiting, constipation, or diarrhea. Denies urinary complaints. Patient offers no further specific complaints today.  REVIEW OF SYSTEMS:  Review of Systems  Constitutional:  Negative for appetite change, chills, fatigue, fever and unexpected weight change.  HENT:   Negative for lump/mass, mouth sores, sore throat and trouble swallowing.   Respiratory:  Negative for chest tightness, cough, hemoptysis and shortness of breath.   Cardiovascular:  Negative for leg swelling.  Gastrointestinal:  Positive for constipation and diarrhea. Negative for abdominal pain, nausea and vomiting.  Genitourinary:  Negative for bladder incontinence, dysuria, hematuria, pelvic pain and vaginal bleeding.    Musculoskeletal:  Positive for back pain. Negative for flank pain and neck stiffness.  Skin:  Negative for itching, rash and wound.  Neurological:  Negative for dizziness, headaches, light-headedness and numbness.  Hematological:  Negative for adenopathy. Does not bruise/bleed easily.  Psychiatric/Behavioral:  Negative for confusion, depression and sleep disturbance. The patient is not nervous/anxious.      HISTORY:   Past Medical History:  Diagnosis Date   Allergic rhinitis    Anemia    Anxiety    Arthritis    Asthma    Atrophic vaginitis    B12 deficiency    Bronchitis, chronic (HCC)    Cervical disc disease    Collagen vascular disease (HCC)    RA   Depression    Foot ulcer (HCC)    GERD (gastroesophageal reflux disease)    Hammer toe    Hyperlipidemia    IBS (irritable bowel syndrome)    Obesity    Obstructive sleep apnea    OSA (obstructive sleep apnea)    Osteoarthrosis    Multi site   Osteopenia    Rheumatoid arthritis (HCC)    Shingles    Thyroid nodule    MULTIPLE   Tonsillitis    recurrent   Umbilical hernia    Past Surgical History:  Procedure Laterality Date   BACK SURGERY  2011   LAMINECTOMY   BREAST BIOPSY Right    neg   CERVICAL FUSION  2011, 2012   X 2   COLONOSCOPY  2005, 2015   ESOPHAGOGASTRODUODENOSCOPY ENDOSCOPY  2005, 2013, 2015   EYE SURGERY Bilateral    Cataract Extraction with IOL    JOINT REPLACEMENT     KNEE ARTHROPLASTY Left  07/12/2016   Procedure: COMPUTER ASSISTED TOTAL KNEE ARTHROPLASTY;  Surgeon: Donato Heinz, MD;  Location: [facility] ORS;  Service: Orthopedics;  Laterality: Left;   Laryngeal tear after intubation w/repair     LUMBAR DISC SURGERY  12/11   REPLACEMENT TOTAL KNEE Right 06/2007   DR. HOOTEN, [facility]   REPLACEMENT UNICONDYLAR JOINT KNEE     DR. HOOTEN, [facility]   SHOULDER ARTHROSCOPY WITH ROTATOR CUFF REPAIR Right    TONSILLECTOMY     TOTAL HIP ARTHROPLASTY Right 01/31/2017   Procedure: TOTAL HIP ARTHROPLASTY;   Surgeon: Donato Heinz, MD;  Location: [facility] OR[facility];  Service: Orthopedics;  Laterality: Right;   TUBAL LIGATION     Family History  Problem Relation Age of Onset   Asthma Mother    Breast cancer Mother 20   Emphysema Father        smoked   Heart disease Father    Lung cancer Father        smoked   Social History   Socioeconomic History   Marital status: Married    Spouse name: Not on file   Number of children: Not on file   Years of education: Not on file   Highest education level: Not on file  Occupational History   Not on file  Tobacco Use   Smoking status: Never   Smokeless tobacco: Never  Vaping Use   Vaping Use: Never used  Substance and Sexual Activity   Alcohol use: No   Drug use: No   Sexual activity: Not on file  Other Topics Concern   Not on file  Social History Narrative   Not on file   Social Determinants of Health   Financial Resource Strain: Not on file  Food Insecurity: Not on file  Transportation Needs: Not on file  Physical Activity: Not on file  Stress: Not on file  Social Connections: Not on file   Allergies  Allergen Reactions   Lactose Other (See Comments) and Diarrhea   Other Other (See Comments)    Pt ONLY tolerates SLOW-FE (slow released iron)---upsets IBS   Biaxin [Clarithromycin] Other (See Comments)    GI upset    Hydrocodone-Acetaminophen Itching    Tolerates acetaminophen    Oxycodone Itching   Sulfa Antibiotics Other (See Comments) and Diarrhea    GI upset  GI upset GI upset    Sulfasalazine Diarrhea   Current Outpatient Medications on File Prior to Visit  Medication Sig Dispense Refill   acetaminophen (TYLENOL) 500 MG tablet Take 1,000 mg by mouth 3 (three) times daily as needed for mild pain.      ALPRAZolam (XANAX) 0.5 MG tablet Take by mouth.     budesonide (PULMICORT) 0.25 MG/2ML nebulizer solution Inhale into the lungs.     budesonide-formoterol (SYMBICORT) 160-4.5 MCG/ACT inhaler Inhale 2 puffs into the lungs 2  (two) times daily as needed.     Cholecalciferol (VITAMIN D) 2000 units tablet Take 4,000 Units by mouth daily.      citalopram (CELEXA) 20 MG tablet Take 20 mg by mouth every evening.   5   dicyclomine (BENTYL) 10 MG capsule Take by mouth.     estradiol (ESTRACE) 0.1 MG/GM vaginal cream Insert pea size amount vaginally nightly x 2 weeks, then every other night x 2 weeks, then 2-3 times weekly for maintenance     famotidine (PEPCID) 20 MG tablet Take by mouth.     ferrous sulfate 325 (65 FE) MG tablet Take 1 tablet by mouth  daily with breakfast.     fluticasone furoate-vilanterol (BREO ELLIPTA) 100-25 MCG/INH AEPB Inhale into the lungs.     hydroxychloroquine (PLAQUENIL) 200 MG tablet Take 1 tablet (200 mg total) by mouth daily. (Patient taking differently: Take 200 mg by mouth daily with lunch.) 1 tablet 0   ibuprofen (ADVIL,MOTRIN) 200 MG tablet Take 400 mg by mouth every 6 (six) hours as needed (for pain.).     leflunomide (ARAVA) 20 MG tablet Take by mouth.     levalbuterol (XOPENEX) 1.25 MG/3ML nebulizer solution Inhale into the lungs.     meclizine (ANTIVERT) 12.5 MG tablet PLEASE SEE ATTACHED FOR DETAILED DIRECTIONS     meloxicam (MOBIC) 7.5 MG tablet Take 7.5-15 mg by mouth See admin instructions. Take 1 tablet (7.5 mg) twice daily or 2 tablets (15 mg) in the evening.  0   metoprolol succinate (TOPROL-XL) 25 MG 24 hr tablet Take 1 tablet by mouth daily.     montelukast (SINGULAIR) 10 MG tablet Take 10 mg by mouth daily as needed (for allergies.).      Multiple Vitamins-Minerals (CENTRUM SILVER 50+WOMEN PO) Take by mouth.     naproxen sodium (ALEVE) 220 MG tablet Take 220 mg by mouth 2 (two) times daily as needed (for pain.).     omeprazole (PRILOSEC) 40 MG capsule Take by mouth.     potassium chloride (KLOR-CON) 10 MEQ tablet Take by mouth.     Simethicone 180 MG CAPS Take 1-2 capsules by mouth as needed.     Spacer/Aero-Holding Chambers (E-Z SPACER) inhaler Use as instructed 1 each 2    triamcinolone (NASACORT ALLERGY 24HR) 55 MCG/ACT AERO nasal inhaler Place 2 sprays into the nose daily.     Adalimumab (HUMIRA PEN) 40 MG/0.4ML PNKT Inject into the skin. (Patient not taking: Reported on 07/15/2020)     albuterol (VENTOLIN HFA) 108 (90 Base) MCG/ACT inhaler SMARTSIG:2 Inhalation Via Inhaler Every 6 Hours PRN     enoxaparin (LOVENOX) 30 MG/0.3ML injection Inject 0.3 mLs (30 mg total) into the skin every 12 (twelve) hours for 14 days. 28 Syringe 0   fluconazole (DIFLUCAN) 150 MG tablet Take 150 mg by mouth once.     nystatin (MYCOSTATIN) 100000 UNIT/ML suspension Take 1 mL by mouth 4 (four) times daily as needed. For thrush/mouth sores. (Patient not taking: Reported on 07/15/2020)  0   Potassium 99 MG TABS Take 99 mg by mouth daily with lunch.      pseudoephedrine (SUDAFED) 30 MG tablet Take 30 mg by mouth every 4 (four) hours as needed for congestion. (Patient not taking: Reported on 07/15/2020)     No current facility-administered medications on file prior to visit.    VITALS:  Blood pressure 140/90, pulse 90, temperature (!) 97.3 F (36.3 C), temperature source Tympanic, resp. rate 19, height 5' 1.2" (1.554 m), weight 194 lb 9.6 oz (88.3 kg), SpO2 97 %.  Wt Readings from Last 3 Encounters:  07/15/20 194 lb 9.6 oz (88.3 kg)  04/04/20 194 lb (88 kg)  02/05/20 193 lb (87.5 kg)    Body mass index is 36.53 kg/m.  Performance status (ECOG): 1 - Symptomatic but completely ambulatory  PHYSICAL EXAM:  Physical Exam Nursing note reviewed.  Constitutional:      Appearance: She is not ill-appearing.  HENT:     Head: Normocephalic.  Eyes:     General: No scleral icterus.    Conjunctiva/sclera: Conjunctivae normal.  Cardiovascular:     Rate and Rhythm: Normal rate and regular  rhythm.     Pulses: Normal pulses.  Pulmonary:     Effort: Pulmonary effort is normal. No respiratory distress.     Breath sounds: Normal breath sounds.  Abdominal:     General: There is no distension.      Tenderness: There is no abdominal tenderness. There is no guarding.  Musculoskeletal:        General: No swelling or deformity.     Cervical back: Neck supple.  Lymphadenopathy:     Cervical: No cervical adenopathy.  Skin:    General: Skin is warm and dry.     Coloration: Skin is not pale.     Findings: No rash.  Neurological:     Mental Status: She is alert and oriented to person, place, and time.  Psychiatric:        Behavior: Behavior normal.        Thought Content: Thought content normal.        Judgment: Judgment normal.    LABS:   CBC EXTENDED Latest Ref Rng & Units 06/14/2017 01/31/2017 01/25/2017  WBC 3.6 - 11.0 K/uL 3.5(L) - 4.8  RBC 3.80 - 5.20 MIL/uL 4.00 - 3.63(L)  HGB 12.0 - 16.0 g/dL 96.0 11.6(L) 11.6(L)  HCT 35.0 - 47.0 % 37.1 34.0(L) 34.1(L)  PLT 150 - 440 K/uL 166 - 152  NEUTROABS 1.4 - 6.5 x10 3/mm 3 - - -  LYMPHSABS 1.0 - 3.6 x10 3/mm 3 - - -   CMP Latest Ref Rng & Units 06/14/2017 02/01/2017 01/31/2017  Glucose 65 - 99 mg/dL 88 454(U) 81  BUN 6 - 20 mg/dL 16 15 -  Creatinine 9.81 - 1.00 mg/dL 1.91 4.78(G) -  Sodium 135 - 145 mmol/L 142 138 143  Potassium 3.5 - 5.1 mmol/L 3.2(L) 3.8 3.3(L)  Chloride 101 - 111 mmol/L 108 104 -  CO2 22 - 32 mmol/L 25 26 -  Calcium 8.9 - 10.3 mg/dL 9.5(A) 2.1(H) -  Total Protein 6.5 - 8.1 g/dL - - -  Total Bilirubin 0.3 - 1.2 mg/dL - - -  Alkaline Phos 38 - 126 U/L - - -  AST 15 - 41 U/L - - -  ALT 14 - 54 U/L - - -   No results found for: CEA1 / No results found for: CEA1 No results found for: PSA1 No results found for: YQM578 No results found for: ION629  Lab Results  Component Value Date   TOTALPROTELP 6.1 07/15/2020   ALBUMINELP 3.6 07/15/2020   A1GS 0.2 07/15/2020   A2GS 0.7 07/15/2020   BETS 0.9 07/15/2020   GAMS 0.6 07/15/2020   MSPIKE Not Observed 07/15/2020   SPEI Comment 07/15/2020   Lab Results  Component Value Date   TIBC 314 07/15/2020   FERRITIN 23 07/15/2020   IRONPCTSAT 17 07/15/2020    Lab Results  Component Value Date   LDH 220 (H) 07/15/2020    STUDIES:  CT ABDOMEN PELVIS WO CONTRAST  Result Date: 06/25/2020 CLINICAL DATA:  Left lower quadrant abdominal pain x2 months EXAM: CT ABDOMEN AND PELVIS WITHOUT CONTRAST TECHNIQUE: Multidetector CT imaging of the abdomen and pelvis was performed following the standard protocol without IV contrast. COMPARISON:  09/05/2018 FINDINGS: Lower chest: Lung bases are clear. Hepatobiliary: Unenhanced liver is unremarkable. Gallbladder is unremarkable. No intrahepatic or extrahepatic ductal dilatation. Pancreas: Within normal limits. Spleen: Within normal limits. Adrenals/Urinary Tract: Adrenal glands are within normal limits. Kidneys are within normal limits. No renal, ureteral, or bladder calculi. No hydronephrosis. Bladder is  underdistended but unremarkable. Stomach/Bowel: Stomach is notable for a small hiatal hernia. No evidence of bowel obstruction. Normal appendix (series 2/image 53). No colonic wall thickening or inflammatory changes. Vascular/Lymphatic: No evidence of abdominal aortic aneurysm. Atherosclerotic calcifications of the abdominal aorta and branch vessels. No suspicious abdominopelvic lymphadenopathy. Reproductive: Uterus is within normal limits. Bilateral ovaries are within normal limits. Other: No abdominopelvic ascites. Large fat containing periumbilical hernia (series 2/image 47). Musculoskeletal: Degenerative changes of the visualized thoracolumbar spine. Right hip arthroplasty, without evidence of complication. IMPRESSION: No colonic wall thickening or inflammatory changes. No CT findings to account for the patient's chronic left lower quadrant abdominal pain. Additional stable ancillary findings as above. Electronically Signed   By: Charline Bills M.D.   On: 06/25/2020 09:21      ASSESSMENT & PLAN:   Assessment:  FELIPE CABELL is a 76 y.o. female who presents as new patient for evaluation of pancytopenia.    Anemia- chronic since at least 2015 ranging from 10 to 12s. Macrocytic and Hyperchromic most recently.  Leukopenia- recently resolved with normal wbc count of 5.0 however, elevated AEC 7.9%. Etiology unclear.  Thrombocytopenia- chronic since at least 2018, ranging from 120s to 160s.  Alternating constipation and diarrhea- plan for colonoscopy on 08/04/20.   Plan: 1.  Labs today: cbc w/ diff, cmp, ana, coombs test, ferritin, iron and tibc, folate, b12, hep b & c, spep, ldh, haptoglobin, fibrinogen, sed rate, crp, platelet antibody profile, blood smear.   LDH was elevated. Recommend ultrasound liver and spleen. If negative, consider bone marrow.   Will have patient follow up with MD for labs and discussion of results and management.   I discussed the assessment and treatment plan with the patient.  The patient was provided an opportunity to ask questions and all were answered.  The patient agreed with the plan and demonstrated an understanding of the instructions.  The patient was advised to call back if the symptoms worsen or if the condition fails to improve as anticipated.  Thank you for the opportunity to participate in the care of this pleasant patient.    I spent 45 minutes face-to-face visit time dedicated to the care of this patient on the date of this encounter to including pre-visit review of pcp, gi notes, face-to-face time with the patient, and post visit ordering of testing/documentation.    Consuello Masse, DNP, AGNP-C [facility]

## 2020-07-15 NOTE — Progress Notes (Signed)
New pt today for pancytopenia. States she has had anemia for the last year or 2.Has Hx asthma. Dyspnea walking to exam room, she states she has had this dypnea for a couple of years now. Has hx diverticulitis and pain in left side of abdomen with a lot of gas. This started bothering her in January.

## 2020-07-16 LAB — PLATELET ANTIBODY PROFILE
Glycoprotein IV Antibody: NEGATIVE
HLA Ab Ser Ql EIA: NEGATIVE
IA/IIA Antibody: NEGATIVE
IB/IX Antibody: NEGATIVE
IIB/IIIA Antibody: NEGATIVE

## 2020-07-16 LAB — PROTEIN ELECTROPHORESIS, SERUM
A/G Ratio: 1.4 (ref 0.7–1.7)
Albumin ELP: 3.6 g/dL (ref 2.9–4.4)
Alpha-1-Globulin: 0.2 g/dL (ref 0.0–0.4)
Alpha-2-Globulin: 0.7 g/dL (ref 0.4–1.0)
Beta Globulin: 0.9 g/dL (ref 0.7–1.3)
Gamma Globulin: 0.6 g/dL (ref 0.4–1.8)
Globulin, Total: 2.5 g/dL (ref 2.2–3.9)
Total Protein ELP: 6.1 g/dL (ref 6.0–8.5)

## 2020-07-16 LAB — HAPTOGLOBIN: Haptoglobin: 160 mg/dL (ref 42–346)

## 2020-07-20 LAB — ANA W/REFLEX: Anti Nuclear Antibody (ANA): NEGATIVE

## 2020-08-02 ENCOUNTER — Ambulatory Visit (INDEPENDENT_AMBULATORY_CARE_PROVIDER_SITE_OTHER): Payer: Medicare Other | Admitting: Vascular Surgery

## 2020-08-02 ENCOUNTER — Encounter (INDEPENDENT_AMBULATORY_CARE_PROVIDER_SITE_OTHER): Payer: Medicare Other

## 2020-08-03 ENCOUNTER — Other Ambulatory Visit: Payer: Self-pay | Admitting: General Surgery

## 2020-08-03 ENCOUNTER — Encounter: Payer: Self-pay | Admitting: General Surgery

## 2020-08-03 ENCOUNTER — Ambulatory Visit: Admission: RE | Admit: 2020-08-03 | Payer: Medicare Other | Source: Ambulatory Visit

## 2020-08-03 NOTE — Progress Notes (Signed)
PATIENT PROFILE: Courtney Wilcox is a 75 y.o. 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)   Osteopenia(GWK)   Chronic bronchitis (CMS-HCC)   Asthma without status asthmaticus, unspecified   Spinal stenosis in cervical region   Cervical disc disease   Lumbar disc disease   Fibrocystic disease of both breasts   Anemia   Sleep apnea   GERD (gastroesophageal reflux disease)   Anxiety   Other specified rheumatoid arthritis, multiple sites (CMS-HCC)   Thyroid nodule   Recurrent UTI   Osteoarthritis of spine with radiculopathy, lumbar region   DDD (degenerative disc disease), lumbar   Lumbar radiculitis   Lumbar stenosis with neurogenic claudication   Other asthma   Chest pain, atypical   Status post total left knee replacement   Spinal 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 Random biopsy negative. 10y repeat noted. EGD 12/18/13- Dr Wohl/gerd- benign intrinsic esophageal stenosis that was dilated, gastritis normal duodenum. There was no H pylori,  Dysplasia/malignancy. Barium swallow 1/16- dysphagia- negative exam RUQ [place] 3/18- fatty liver CT 8/20 abdomen/pelvis with contras-t IMPRESSION:  1. Mild bowel wall thickening of duodenum and proximal jejunum which  may be secondary to underdis tention versus mild enteritis.  2. Fat containing umbilical hernia.   (Enteritis was treated with cipro and flagyl with improvement   Treated for SIBO with  metronidazole   INTERVAL HISTORY:   Courtney Wilcox was last seen on 04/27/20 for IBSD/+ SIBO that had improved after treatment with metronidazole- still had some intermittent LLQ pain. Noted dicyclomine made her sleepy. At that time "1. IBSD- better after metronidazole- try some ib gard otc prior to meals, discouraged dicyclomine due to side effects she has reported. Diet mods reviewed trial of lactose free milk - may have component of lactose maldigestion- can also try some lactaid wafers. As she reports she is better after recent treatment will hold further testing at this time. 2. GERD- stable 3. Follow up 21m sooner if problems * in further review note some mild thrombocytopenia over the last year after patient left- plan on discussing at next visit."   Since the last visit, was seen by PCP last month for recurrence of bloating/gassiness and increase in llq pain- symptoms started a few weeks after her last appt with me CBC with some pancytopenia, cmp unremarkable, covid negative and stool pcr negative- was to have CT a/p but this has not been done yet- this is now scheduled for 22 Jun States she is having problems with peristent llq pain and gassiness/bloating. Bowels have been irregular in consistency from "normal" to explosive/loose, sometimes bowels difficult to evacuate her LLQ pain has increased Appetite down due to symptoms but weight up 3lb since last visit Feels better after she passes gas but not necessarily defecating. She has been mindful about her diet and avoiding known triggers however does endorse drinking several pepsis daily- not taking any fiber at this time Denies all  further GI concerns. There is some intermittent dysuria but she has no vaginal complaints.  She has RA and is to start Humira for same soon. She is on SSRI.   Medications:   Encounter Medications        Outpatient Encounter Medications as of 06/24/2020  Medication Sig Dispense Refill   acetaminophen (TYLENOL  EXTRA STRENGTH) 500 MG tablet 2 tab by mouth as needed as needed       albuterol 90 mcg/actuation inhaler INHALE 2 INHALATIONS INTO THE LUNGS EVERY 6 (SIX) HOURS AS NEEDED 18 g 2   ALPRAZolam (XANAX) 0.5 MG tablet TAKE 1 TABLET (0.5 MG TOTAL) BY MOUTH ONCE DAILY AS NEEDED FOR SLEEP OR ANXIETY 30 tablet 1   cholecalciferol (VITAMIN D3) 2,000 unit tablet Take 2,000 Units by mouth once daily.          citalopram (CELEXA) 20 MG tablet TAKE 1 AND 1/2 TABLETS BY MOUTH EVERY DAY 45 tablet 5   dicyclomine (BENTYL) 10 mg capsule Take 1 capsule (10 mg total) by mouth 3 (three) times daily as needed 120 capsule 11   estradioL (ESTRACE) 0.01 % (0.1 mg/gram) vaginal cream Insert pea size amount vaginally nightly x 2 weeks, then every other night x 2 weeks, then 2-3 times weekly for maintenance 30 g 3   famotidine (PEPCID) 20 MG tablet Take 20 mg by mouth as needed for Heartburn       ferrous sulfate 325 (65 FE) MG tablet Take 1 tablet (325 mg total) by mouth daily with breakfast 30 tablet 1   leflunomide (ARAVA) 20 MG tablet Take 1 tablet (20 mg total) by mouth once daily 30 tablet 6   metoprolol succinate (TOPROL-XL) 25 MG XL tablet TAKE 1 TABLET BY MOUTH EVERY DAY 30 tablet 11   montelukast (SINGULAIR) 10 mg tablet TAKE 1 TABLET BY MOUTH EVERY DAY AT NIGHT 90 tablet 0   nystatin (MYCOSTATIN) 100,000 unit/mL suspension SWISH AND SWALLOW 5 MLS 4 (FOUR) TIMES DAILY FOR 10 DAYS 200 mL 2   omeprazole (PRILOSEC) 40 MG DR capsule Take 1 capsule (40 mg total) by mouth once daily Take 30 minutes before breakfast. 90 capsule 1   prednisoLONE acetate (PRED FORTE) 1 % ophthalmic suspension Administer 1 drop to the right eye Four (4) times a day.       pseudoephedrine (SUDAFED) 30 mg tablet Take 30 mg by mouth once daily as needed       adalimumab (HUMIRA,CF, PEN) 40 mg/0.4 mL pen injector kit Inject 40 mg subcutaneously every 14 (fourteen) days (Patient not taking: Reported on 06/24/2020) 6 each 1   budesonide (PULMICORT)  0.25 mg/2 mL nebulizer solution Take 2 mLs (0.25 mg total) by nebulization 2 (two) times daily (Patient not taking: No sig reported) 360 mL 3   fluticasone furoate-vilanteroL (BREO ELLIPTA) 100-25 mcg/dose DsDv inhaler Inhale 1 inhalation into the lungs once daily (Patient not taking: No sig reported) 3 Inhaler 3   gabapentin (NEURONTIN) 100 MG capsule TAKE 1 CAPSULE BY MOUTH EVERY EVENING (Patient not taking: No sig reported) 30 capsule 3   HYDROcodone-acetaminophen (NORCO) 5-325 mg tablet 1 tablet every 4 (four) hours as needed    (Patient not taking: No sig reported)       levalbuterol (XOPENEX) 1.25 mg/3 mL nebulizer solution Take 3 mLs (1.25 mg total) by nebulization 2 (two) times daily (Patient not taking: No sig reported) 540 mL 3   meloxicam (MOBIC) 7.5 MG tablet Take 1 tablet (7.5 mg total) by  mouth 2 (two) times daily with meals (Patient not taking: Reported on 06/24/2020) 60 tablet 1   predniSONE (DELTASONE) 10 MG tablet 6-day taper; 6 pills day one, 5 pills day 2, 4 pills day 3, etc. Follow package instructions. (Patient not taking: Reported on 06/24/2020) 21 tablet 0             Facility-Administered Encounter Medications as of 06/24/2020  Medication Dose Route Frequency Provider Last Rate Last Admin   cyanocobalamin (VITAMIN B12) injection 1,000 mcg  1,000 mcg Intramuscular Q30 Days Paulita Cradle [place], [place]   1,000 mcg at 05/24/20 1552        ALLERGIES: Biaxin [clarithromycin], Lactose, Norco [hydrocodone-acetaminophen], Other, Oxycontin [oxycodone], and Sulfa (sulfonamide antibiotics)     PMHx:       Past Medical History:  Diagnosis Date   Allergic state     Anemia     Anxiety     Arthritis     Asthma without status asthmaticus, unspecified     Atrophic vaginitis     Cervical disc disease      with spinal stenosis   Chronic bronchitis (CMS-HCC)     Depression     Environmental allergies     Fibrocystic disease of both breasts     GERD (gastroesophageal reflux disease)      H/O mammogram 12/17/2012   IBS (irritable bowel syndrome)     Lumbar disc disease     Multiple thyroid nodules      Stable on follow-up ultrasounds from 2015 - 2020. No routine follow-up needed.    Obesity     Osteoarthritis     Osteopenia     Recurrent tonsillitis     Rheumatoid arthritis (CMS-HCC)     Shingles     Sleep apnea     Ulcer      Foot      PSHx:      Past Surgical History:  Procedure Laterality Date   ANTERIOR FUSION CERVICAL SPINE   2011   BACK SURGERY   2011    laminectomy   Breast biopsies       CATARACT EXTRACTION       COLONOSCOPY   04/28/2003    Hyperplastic Polyp: CBF 07/2013   COLONOSCOPY   12/18/2013    Int Hemorrhoids (Wohl): CBF 12/2023   EGD   09/13/2011, 07/28/2003   EGD   12/18/2013   ENDOSCOPIC CARPAL TUNNEL RELEASE Left 07/09/2019    Dr. Rudene Christians   Exterior cervical fusion   2012    cleaned out infection   HERNIA REPAIR       Left total knee arthroplasty using computer-assisted navigation   07/12/2016    Dr Marry Guan   Right subacromial decompression and rotator cuff repair Right 05/28/13   Right total hip arthroplasty   01/31/2017    Dr Marry Guan   Right total knee arthroplasty   07/09/2007    Dr. Marry Guan   TONSILLECTOMY       TUBAL LIGATION            FAMILY HISTORY:      Family History  Problem Relation Age of Onset   Breast cancer Mother     High blood pressure (Hypertension) Mother     Diabetes Mother     Myocardial Infarction (Heart attack) Father     Lung cancer Father     Heart murmur Sister        Social History: Social History  Socioeconomic History   Marital status: Married      Spouse name: Dominica Severin   Number of children: 3   Years of education: 35  Occupational History   Occupation: Retired - Futures trader  Tobacco Use   Smoking status: Never Smoker   Smokeless tobacco: Never Used  Scientific laboratory technician Use: Never used  Substance and Sexual Activity   Alcohol use: No      Alcohol/week: 0.0 standard  drinks   Drug use: No   Sexual activity: Not Currently      Partners: Male      Birth control/protection: Surgical, Post-menopausal         REVIEW OF SYSTEMS:    10 systems reviewed, unremarkable other than what is written above with the exception of arthalgias, back pain.   PHYSICAL EXAM:      Vitals:    06/24/20 0932  BP: (!) 151/87  Pulse: 67  Temp: 36.2 C (97.1 F)    Body mass index is 36.66 kg/m. Weight: 88 kg (194 lb)    General Appearance:    Alert, cooperative, no distress, appears stated age Head:     Atraumatic, normocephalic Eyes:     Anciteric, conjunctiva pink. Mouth: no oral ulcers Lungs:               Respirations eupneic.  Clear to auscultation bilaterally  Heart:               Regular rate and rhythm, S1 and S2 normal, no murmur, rub   or gallop Abdomen:          Flat, bowel sounds x 4.Soft, there is some llq tenderness/nondistended. No guarding, rigidity, rebound tenderness or other peritoneal signs Extremities:       No cyanosis or edema, moves all extremities well. Strength 5/5. Skin:      Skin color, texture, turgor normal, no rashes or lesions Neuro: alert, oriented x 3, cranial nerves II-XII intact Psych: pleasant, calm, cooperative, Logical thought and good insight.     REVIEW OF DATA: I have reviewed the following data today:   Prior notes, labs, imaging       ASSESSMENT AND PLAN:   1. LLQ pain/bowel habit changes- try some daily fiber and ib gard. Recommend increasing water intake and no more than one 12oz pepsi/d. Ua/c&s, cbc, cmp Move ct up ( now for tomorrow) and schedule colonoscopy.  Procedure information given: indications, benefits, risks- including, but not limited to bleeding, infection, perforation, difficulty with sedation, were discussed with the patient, and they are agreeable to the procedure 2. Pancytopenia- hematology referral 3. Follow up a couple weeks after procedure, sooner if problems     Addendum 6/10- ct  unrevealing. UA with some wbc and leuks but no nitrites-and no rbcs. Culture pending- will wait for this before prescibing. CT A/P today unrevealing. CBC with improved WBC,  CMP unremarkable with the exception of gfr 49

## 2020-08-04 ENCOUNTER — Ambulatory Visit: Payer: Medicare Other | Admitting: Anesthesiology

## 2020-08-04 ENCOUNTER — Ambulatory Visit
Admission: RE | Admit: 2020-08-04 | Discharge: 2020-08-04 | Disposition: A | Discharge status: Home / Self Care | Payer: Medicare Other | Attending: General Surgery | Admitting: General Surgery

## 2020-08-04 ENCOUNTER — Encounter
Admission: RE | Disposition: A | Discharge status: Home / Self Care | Payer: Self-pay | Source: Home / Self Care | Attending: General Surgery

## 2020-08-04 ENCOUNTER — Encounter: Payer: Self-pay | Admitting: General Surgery

## 2020-08-04 ENCOUNTER — Ambulatory Visit: Payer: Medicare Other | Admitting: Oncology

## 2020-08-04 DIAGNOSIS — Z7951 Long term (current) use of inhaled steroids: Secondary | ICD-10-CM | POA: Insufficient documentation

## 2020-08-04 DIAGNOSIS — K5730 Diverticulosis of large intestine without perforation or abscess without bleeding: Secondary | ICD-10-CM | POA: Insufficient documentation

## 2020-08-04 DIAGNOSIS — K589 Irritable bowel syndrome without diarrhea: Secondary | ICD-10-CM | POA: Insufficient documentation

## 2020-08-04 DIAGNOSIS — Z791 Long term (current) use of non-steroidal anti-inflammatories (NSAID): Secondary | ICD-10-CM | POA: Diagnosis not present

## 2020-08-04 DIAGNOSIS — Z885 Allergy status to narcotic agent status: Secondary | ICD-10-CM | POA: Diagnosis not present

## 2020-08-04 DIAGNOSIS — Z882 Allergy status to sulfonamides status: Secondary | ICD-10-CM | POA: Insufficient documentation

## 2020-08-04 DIAGNOSIS — R1032 Left lower quadrant pain: Secondary | ICD-10-CM | POA: Diagnosis present

## 2020-08-04 DIAGNOSIS — Z79899 Other long term (current) drug therapy: Secondary | ICD-10-CM | POA: Insufficient documentation

## 2020-08-04 DIAGNOSIS — Z96653 Presence of artificial knee joint, bilateral: Secondary | ICD-10-CM | POA: Insufficient documentation

## 2020-08-04 DIAGNOSIS — Z96641 Presence of right artificial hip joint: Secondary | ICD-10-CM | POA: Diagnosis not present

## 2020-08-04 HISTORY — PX: COLONOSCOPY WITH PROPOFOL: SHX5780

## 2020-08-04 HISTORY — DX: Diffuse cystic mastopathy of unspecified breast: N60.19

## 2020-08-04 SURGERY — COLONOSCOPY WITH PROPOFOL
Anesthesia: General

## 2020-08-04 MED ORDER — PROPOFOL 500 MG/50ML IV EMUL
INTRAVENOUS | Status: DC | PRN
  Administered 2020-08-04: 115 ug/kg/min via INTRAVENOUS

## 2020-08-04 MED ORDER — PROPOFOL 10 MG/ML IV BOLUS
INTRAVENOUS | Status: DC | PRN
  Administered 2020-08-04: 30 mg via INTRAVENOUS

## 2020-08-04 MED ORDER — SODIUM CHLORIDE 0.9 % IV SOLN: INTRAVENOUS | Status: DC

## 2020-08-04 MED ORDER — LIDOCAINE HCL (CARDIAC) PF 100 MG/5ML IV SOSY
PREFILLED_SYRINGE | INTRAVENOUS | Status: DC | PRN
  Administered 2020-08-04: 40 mg via INTRAVENOUS

## 2020-08-04 NOTE — H&P (Signed)
Courtney Wilcox [id number] [birth year]     HPI: 75 year old woman with a long history of IBS.  49-monthhistory of increasing gas and intermittent left lower quadrant pain.  Not dietary associated.  No change in daily regular stool.  No history of blood or mucus in the stools.  No associated with nausea or vomiting.  Not activity related.  [facility] June, 2022.  History of an umbilical hernia in the distant past.  Recurrence based on recent CT.  No tenderness in this area reported by the patient.  History of mildly depressed white blood cell, hemoglobin and platelet count.  Recent evaluation by hematology.  Modest thrombocytopenia unchanged since 2018.  Work-up in progress.  Colonoscopy in 2015 showed diverticulosis.  Medications Prior to Admission  Medication Sig Dispense Refill Last Dose   Cholecalciferol (VITAMIN D) 2000 units tablet Take 4,000 Units by mouth daily.    Past Week   citalopram (CELEXA) 20 MG tablet Take 20 mg by mouth every evening.   5 08/03/2020   Cyanocobalamin 1000 MCG/ML KIT Inject as directed every 30 (thirty) days.   Past Week   famotidine (PEPCID) 20 MG tablet Take by mouth.   08/03/2020   hydroxychloroquine (PLAQUENIL) 200 MG tablet Take 1 tablet (200 mg total) by mouth daily. (Patient taking differently: Take 200 mg by mouth daily with lunch.) 1 tablet 0 08/03/2020   leflunomide (ARAVA) 20 MG tablet Take by mouth.   08/03/2020   levalbuterol (XOPENEX) 1.25 MG/3ML nebulizer solution Inhale into the lungs.   08/04/2020   meloxicam (MOBIC) 7.5 MG tablet Take 7.5-15 mg by mouth See admin instructions. Take 1 tablet (7.5 mg) twice daily or 2 tablets (15 mg) in the evening.  0 Past Month   Multiple Vitamins-Minerals (CENTRUM SILVER 50+WOMEN PO) Take by mouth.   Past Week   prednisoLONE acetate (PRED FORTE) 1 % ophthalmic suspension 1 drop 4 (four) times daily.      acetaminophen (TYLENOL) 500 MG tablet Take 1,000 mg by mouth 3 (three) times daily as needed for mild  pain.       Adalimumab (HUMIRA PEN) 40 MG/0.4ML PNKT Inject into the skin. (Patient not taking: Reported on 07/15/2020)      albuterol (VENTOLIN HFA) 108 (90 Base) MCG/ACT inhaler SMARTSIG:2 Inhalation Via Inhaler Every 6 Hours PRN      ALPRAZolam (XANAX) 0.5 MG tablet Take by mouth daily as needed.      budesonide (PULMICORT) 0.25 MG/2ML nebulizer solution Inhale into the lungs.      budesonide-formoterol (SYMBICORT) 160-4.5 MCG/ACT inhaler Inhale 2 puffs into the lungs 2 (two) times daily as needed.      dicyclomine (BENTYL) 10 MG capsule Take by mouth 3 (three) times daily as needed.      enoxaparin (LOVENOX) 30 MG/0.3ML injection Inject 0.3 mLs (30 mg total) into the skin every 12 (twelve) hours for 14 days. 28 Syringe 0    estradiol (ESTRACE) 0.1 MG/GM vaginal cream Insert pea size amount vaginally nightly x 2 weeks, then every other night x 2 weeks, then 2-3 times weekly for maintenance      ferrous sulfate 325 (65 FE) MG tablet Take 1 tablet by mouth daily with breakfast. (Patient not taking: Reported on 08/04/2020)   Not Taking   fluconazole (DIFLUCAN) 150 MG tablet Take 150 mg by mouth once.      fluticasone furoate-vilanterol (BREO ELLIPTA) 100-25 MCG/INH AEPB Inhale into the lungs.      ibuprofen (ADVIL,MOTRIN) 200 MG tablet  Take 400 mg by mouth every 6 (six) hours as needed (for pain.).      meclizine (ANTIVERT) 12.5 MG tablet PLEASE SEE ATTACHED FOR DETAILED DIRECTIONS      metoprolol succinate (TOPROL-XL) 25 MG 24 hr tablet Take 1 tablet by mouth daily. (Patient not taking: Reported on 08/04/2020)   Not Taking   montelukast (SINGULAIR) 10 MG tablet Take 10 mg by mouth daily as needed (for allergies.).       naproxen sodium (ALEVE) 220 MG tablet Take 220 mg by mouth 2 (two) times daily as needed (for pain.).      nystatin (MYCOSTATIN) 100000 UNIT/ML suspension Take 1 mL by mouth 4 (four) times daily as needed. For thrush/mouth sores. (Patient not taking: Reported on 07/15/2020)  0     omeprazole (PRILOSEC) 40 MG capsule Take by mouth.      Potassium 99 MG TABS Take 99 mg by mouth daily with lunch.       potassium chloride (KLOR-CON) 10 MEQ tablet Take by mouth.      pseudoephedrine (SUDAFED) 30 MG tablet Take 30 mg by mouth every 4 (four) hours as needed for congestion. (Patient not taking: Reported on 07/15/2020)      Simethicone 180 MG CAPS Take 1-2 capsules by mouth as needed.      Spacer/Aero-Holding Chambers (E-Z SPACER) inhaler Use as instructed 1 each 2    triamcinolone (NASACORT ALLERGY 24HR) 55 MCG/ACT AERO nasal inhaler Place 2 sprays into the nose daily.      Allergies  Allergen Reactions   Lactose Other (See Comments) and Diarrhea   Other Other (See Comments)    Pt ONLY tolerates SLOW-FE (slow released iron)---upsets IBS   Biaxin [Clarithromycin] Other (See Comments)    GI upset    Hydrocodone-Acetaminophen Itching    Tolerates acetaminophen    Oxycodone Itching   Sulfa Antibiotics Other (See Comments) and Diarrhea    GI upset  GI upset GI upset    Sulfasalazine Diarrhea   Past Medical History:  Diagnosis Date   Allergic rhinitis    Anemia    Anxiety    Arthritis    Asthma    Atrophic vaginitis    B12 deficiency    Bronchitis, chronic (HCC)    Cervical disc disease    Collagen vascular disease (HCC)    RA   Depression    Fibrocystic breast disease    Foot ulcer (HCC)    GERD (gastroesophageal reflux disease)    Hammer toe    Hyperlipidemia    IBS (irritable bowel syndrome)    Obesity    Obstructive sleep apnea    OSA (obstructive sleep apnea)    Osteoarthrosis    Multi site   Osteopenia    Rheumatoid arthritis ([place])    Shingles    Thyroid nodule    MULTIPLE   Tonsillitis    recurrent   Umbilical hernia    Past Surgical History:  Procedure Laterality Date   BACK SURGERY  2011   LAMINECTOMY   BREAST BIOPSY Right    neg   CERVICAL FUSION  2011, 2012   X 2   COLONOSCOPY  2005, 2015   ESOPHAGOGASTRODUODENOSCOPY ENDOSCOPY   2005, 2013, 2015   EYE SURGERY Bilateral    Cataract Extraction with IOL    HERNIA REPAIR     JOINT REPLACEMENT     KNEE ARTHROPLASTY Left 07/12/2016   Procedure: COMPUTER ASSISTED TOTAL KNEE ARTHROPLASTY;  Surgeon: Dereck Leep, MD;  Location:  [facility] ORS;  Service: Orthopedics;  Laterality: Left;   Laryngeal tear after intubation w/repair     LUMBAR DISC SURGERY  12/2009   REPLACEMENT TOTAL KNEE Right 06/2007   DR. HOOTEN, [facility]   REPLACEMENT UNICONDYLAR JOINT KNEE     DR. HOOTEN, [facility]   SHOULDER ARTHROSCOPY WITH ROTATOR CUFF REPAIR Right    TONSILLECTOMY     TOTAL HIP ARTHROPLASTY Right 01/31/2017   Procedure: TOTAL HIP ARTHROPLASTY;  Surgeon: Dereck Leep, MD;  Location: [facility] ORS;  Service: Orthopedics;  Laterality: Right;   TUBAL LIGATION     UPPER GI ENDOSCOPY     Social History   Socioeconomic History   Marital status: Married    Spouse name: Not on file   Number of children: Not on file   Years of education: Not on file   Highest education level: Not on file  Occupational History   Not on file  Tobacco Use   Smoking status: Never   Smokeless tobacco: Never  Vaping Use   Vaping Use: Never used  Substance and Sexual Activity   Alcohol use: No   Drug use: No   Sexual activity: Not on file  Other Topics Concern   Not on file  Social History Narrative   Not on file   Social Determinants of Health   Financial Resource Strain: Not on file  Food Insecurity: Not on file  Transportation Needs: Not on file  Physical Activity: Not on file  Stress: Not on file  Social Connections: Not on file  Intimate Partner Violence: Not on file   Social History   Social History Narrative   Not on file     ROS: Negative.   June 25, 2020 CT of the abdomen and pelvis: IMPRESSION: No colonic wall thickening or inflammatory changes.   No CT findings to account for the patient's chronic left lower quadrant abdominal pain.   Additional stable ancillary findings as  above.     PE: HEENT: Negative. Lungs: Clear. Cardio: RR. Abdomen: Soft, nontender.  4 cm fullness above the level of the umbilicus consistent grade a ventral hernia with enclosed fat..   Assessment/Plan:  Proceed with planned endoscopy to assess for possible source of left lower quadrant pain.Forest Gleason Tashi Andujo 08/04/2020

## 2020-08-04 NOTE — Anesthesia Postprocedure Evaluation (Signed)
Anesthesia Post Note  Patient: Courtney Wilcox  Procedure(s) Performed: COLONOSCOPY WITH PROPOFOL  Patient location during evaluation: Endoscopy Anesthesia Type: General Level of consciousness: awake and alert Pain management: pain level controlled Vital Signs Assessment: post-procedure vital signs reviewed and stable Respiratory status: spontaneous breathing, nonlabored ventilation, respiratory function stable and patient connected to nasal cannula oxygen Cardiovascular status: blood pressure returned to baseline and stable Postop Assessment: no apparent nausea or vomiting Anesthetic complications: no   No notable events documented.   Last Vitals:  Vitals:   08/04/20 0955 08/04/20 1005  BP: (!) 156/119 (!) 173/78  Pulse: 67 66  Resp: 15 14  Temp:    SpO2: 99% 99%    Last Pain:  Vitals:   08/04/20 1005  TempSrc:   PainSc: 0-No pain                 Cleda Mccreedy [name]

## 2020-08-04 NOTE — Transfer of Care (Signed)
Immediate Anesthesia Transfer of Care Note  Patient: Courtney Wilcox  Procedure(s) Performed: COLONOSCOPY WITH PROPOFOL  Patient Location: PACU and Endoscopy Unit  Anesthesia Type:General  Level of Consciousness: drowsy  Airway & Oxygen Therapy: Patient Spontanous Breathing  Post-op Assessment: Report given to RN and Post -op Vital signs reviewed and stable  Post vital signs: Reviewed and stable  Last Vitals:  Vitals Value Taken Time  BP 113/95 08/04/20 0935  Temp 36.2 C 08/04/20 0935  Pulse 72 08/04/20 0936  Resp 23 08/04/20 0936  SpO2 97 % 08/04/20 0936  Vitals shown include unvalidated device data.  Last Pain:  Vitals:   08/04/20 0935  TempSrc: Tympanic  PainSc: Asleep         Complications: No notable events documented.

## 2020-08-04 NOTE — Op Note (Signed)
[facility] Gastroenterology Patient Name: Courtney Wilcox Procedure Date: 08/04/2020 8:53 AM MRN: [id number] Account #: [id number] Date of Birth: [birth year] Admit Type: Outpatient Age: 75 Room: [facility] ENDO ROOM 1 Gender: Female Note Status: Finalized Procedure:             Colonoscopy Indications:           Abdominal pain in the left lower quadrant Providers:             Earline Mayotte, MD Referring MD:          Marilynne Halsted, MD (Referring MD) Medicines:             Propofol per Anesthesia Complications:         No immediate complications. Procedure:             Pre-Anesthesia Assessment:                        - Prior to the procedure, a History and Physical was                         performed, and patient medications, allergies and                         sensitivities were reviewed. The patient's tolerance                         of previous anesthesia was reviewed.                        - The risks and benefits of the procedure and the                         sedation options and risks were discussed with the                         patient. All questions were answered and informed                         consent was obtained.                        After obtaining informed consent, the colonoscope was                         passed under direct vision. Throughout the procedure,                         the patient's blood pressure, pulse, and oxygen                         saturations were monitored continuously. The                         Colonoscope was introduced through the anus and                         advanced to the the cecum, identified by appendiceal  orifice and ileocecal valve. The colonoscopy was                         performed without difficulty. The patient tolerated                         the procedure well. The quality of the bowel                         preparation was excellent. Findings:      A  few small-mouthed diverticula were found in the sigmoid colon. Modest       muscular hypertrophy in the sigmoid was appreciated.      The retroflexed view of the distal rectum and anal verge was normal and       showed no anal or rectal abnormalities. Impression:            - Diverticulosis in the sigmoid colon.                        - The distal rectum and anal verge are normal on                         retroflexion view.                        - No specimens collected. Recommendation:        - Discharge patient to home (via wheelchair).                        - Resume previous diet. Procedure Code(s):     --- Professional ---                        [phone], Colonoscopy, flexible; diagnostic, including                         collection of specimen(s) by brushing or washing, when                         performed (separate procedure) Diagnosis Code(s):     --- Professional ---                        R10.32, Left lower quadrant pain                        K57.30, Diverticulosis of large intestine without                         perforation or abscess without bleeding CPT copyright 2019 American Medical Association. All rights reserved. The codes documented in this report are preliminary and upon coder review may  be revised to meet current compliance requirements. Earline Mayotte, MD 08/04/2020 9:32:04 AM This report has been signed electronically. Number of Addenda: 0 Note Initiated On: 08/04/2020 8:53 AM Scope Withdrawal Time: 0 hours 9 minutes 48 seconds  Total Procedure Duration: 0 hours 20 minutes 9 seconds  Estimated Blood Loss:  Estimated blood loss: none.      [facility]

## 2020-08-04 NOTE — Anesthesia Preprocedure Evaluation (Signed)
Anesthesia Evaluation  Patient identified by MRN, date of birth, ID band Patient awake    Reviewed: Allergy & Precautions, NPO status , Patient's Chart, lab work & pertinent test results  History of Anesthesia Complications (+) DIFFICULT AIRWAY and history of anesthetic complications  Airway Mallampati: III  TM Distance: <3 FB Neck ROM: limited    Dental  (+) Chipped, Poor Dentition, Missing, Upper Dentures   Pulmonary shortness of breath, asthma , sleep apnea ,    Pulmonary exam normal        Cardiovascular Exercise Tolerance: Good (-) angina(-) Past MI and (-) DOE negative cardio ROS Normal cardiovascular exam     Neuro/Psych PSYCHIATRIC DISORDERS negative neurological ROS     GI/Hepatic Neg liver ROS, GERD  Medicated and Controlled,  Endo/Other  negative endocrine ROS  Renal/GU negative Renal ROS  negative genitourinary   Musculoskeletal  (+) Arthritis ,   Abdominal   Peds  Hematology negative hematology ROS (+)   Anesthesia Other Findings Past Medical History: No date: Allergic rhinitis No date: Anemia No date: Anxiety No date: Arthritis No date: Asthma No date: Atrophic vaginitis No date: B12 deficiency No date: Bronchitis, chronic (HCC) No date: Cervical disc disease No date: Collagen vascular disease (HCC)     Comment:  RA No date: Depression No date: Fibrocystic breast disease No date: Foot ulcer (HCC) No date: GERD (gastroesophageal reflux disease) No date: Hammer toe No date: Hyperlipidemia No date: IBS (irritable bowel syndrome) No date: Obesity No date: Obstructive sleep apnea No date: OSA (obstructive sleep apnea) No date: Osteoarthrosis     Comment:  Multi site No date: Osteopenia No date: Rheumatoid arthritis (HCC) No date: Shingles No date: Thyroid nodule     Comment:  MULTIPLE No date: Tonsillitis     Comment:  recurrent No date: Umbilical hernia  Past Surgical  History: 2011: BACK SURGERY     Comment:  LAMINECTOMY No date: BREAST BIOPSY; Right     Comment:  neg 2011, 2012: CERVICAL FUSION     Comment:  X 2 2005, 2015: COLONOSCOPY 2005, 2013, 2015: ESOPHAGOGASTRODUODENOSCOPY ENDOSCOPY No date: EYE SURGERY; Bilateral     Comment:  Cataract Extraction with IOL  No date: HERNIA REPAIR No date: JOINT REPLACEMENT 07/12/2016: KNEE ARTHROPLASTY; Left     Comment:  Procedure: COMPUTER ASSISTED TOTAL KNEE ARTHROPLASTY;                Surgeon: Donato Heinz, MD;  Location: [facility] ORS;                Service: Orthopedics;  Laterality: Left; No date: Laryngeal tear after intubation w/repair 12/2009: LUMBAR DISC SURGERY 06/2007: REPLACEMENT TOTAL KNEE; Right     Comment:  DR. Ernest Pine, [facility] No date: REPLACEMENT UNICONDYLAR JOINT KNEE     Comment:  DR. Ernest Pine, [facility] No date: SHOULDER ARTHROSCOPY WITH ROTATOR CUFF REPAIR; Right No date: TONSILLECTOMY 01/31/2017: TOTAL HIP ARTHROPLASTY; Right     Comment:  Procedure: TOTAL HIP ARTHROPLASTY;  Surgeon: Donato Heinz, MD;  Location: [facility] ORS;  Service: Orthopedics;               Laterality: Right; No date: TUBAL LIGATION No date: UPPER GI ENDOSCOPY  BMI    Body Mass Index: 35.90 kg/m      Reproductive/Obstetrics negative OB ROS  Anesthesia Physical Anesthesia Plan  ASA: 3  Anesthesia Plan: General   Post-op Pain Management:    Induction: Intravenous  PONV Risk Score and Plan: Propofol infusion and TIVA  Airway Management Planned: Natural Airway and Nasal Cannula  Additional Equipment:   Intra-op Plan:   Post-operative Plan:   Informed Consent: I have reviewed the patients History and Physical, chart, labs and discussed the procedure including the risks, benefits and alternatives for the proposed anesthesia with the patient or authorized representative who has indicated his/her understanding and acceptance.      Dental Advisory Given  Plan Discussed with: Anesthesiologist, CRNA and Surgeon  Anesthesia Plan Comments: (Patient consented for risks of anesthesia including but not limited to:  - adverse reactions to medications - risk of airway placement if required - damage to eyes, teeth, lips or other oral mucosa - nerve damage due to positioning  - sore throat or hoarseness - Damage to heart, brain, nerves, lungs, other parts of body or loss of life  Patient voiced understanding.)        Anesthesia Quick Evaluation

## 2020-08-05 ENCOUNTER — Encounter: Payer: Self-pay | Admitting: General Surgery

## 2020-08-09 ENCOUNTER — Inpatient Hospital Stay: Payer: Medicare Other

## 2020-08-09 ENCOUNTER — Other Ambulatory Visit: Payer: Self-pay

## 2020-08-09 ENCOUNTER — Inpatient Hospital Stay: Payer: Medicare Other | Attending: Oncology | Admitting: Oncology

## 2020-08-09 ENCOUNTER — Encounter: Payer: Self-pay | Admitting: Oncology

## 2020-08-09 VITALS — BP 138/61 | HR 72 | Temp 98.1°F | Resp 18

## 2020-08-09 DIAGNOSIS — D696 Thrombocytopenia, unspecified: Secondary | ICD-10-CM

## 2020-08-09 DIAGNOSIS — R5383 Other fatigue: Secondary | ICD-10-CM | POA: Diagnosis not present

## 2020-08-09 DIAGNOSIS — D649 Anemia, unspecified: Secondary | ICD-10-CM

## 2020-08-09 DIAGNOSIS — D61818 Other pancytopenia: Secondary | ICD-10-CM

## 2020-08-09 LAB — RETIC PANEL
Immature Retic Fract: 8.2 % (ref 2.3–15.9)
RBC.: 3.63 MIL/uL — ABNORMAL LOW (ref 3.87–5.11)
Retic Count, Absolute: 40.7 10*3/uL (ref 19.0–186.0)
Retic Ct Pct: 1.1 % (ref 0.4–3.1)
Reticulocyte Hemoglobin: 33.2 pg (ref >27.9)

## 2020-08-09 LAB — CBC WITH DIFFERENTIAL/PLATELET
Abs Immature Granulocytes: 0.01 10*3/uL (ref 0.00–0.07)
Basophils Absolute: 0.1 10*3/uL (ref 0.0–0.1)
Basophils Relative: 2 %
Eosinophils Absolute: 0.3 10*3/uL (ref 0.0–0.5)
Eosinophils Relative: 8 %
HCT: 35.9 % — ABNORMAL LOW (ref 36.0–46.0)
Hemoglobin: 11.7 g/dL — ABNORMAL LOW (ref 12.0–15.0)
Immature Granulocytes: 0 %
Lymphocytes Relative: 32 %
Lymphs Abs: 1.4 10*3/uL (ref 0.7–4.0)
MCH: 32.2 pg (ref 26.0–34.0)
MCHC: 32.6 g/dL (ref 30.0–36.0)
MCV: 98.9 fL (ref 80.0–100.0)
Monocytes Absolute: 0.4 10*3/uL (ref 0.1–1.0)
Monocytes Relative: 9 %
Neutro Abs: 2.1 10*3/uL (ref 1.7–7.7)
Neutrophils Relative %: 49 %
Platelets: 137 10*3/uL — ABNORMAL LOW (ref 150–400)
RBC: 3.63 MIL/uL — ABNORMAL LOW (ref 3.87–5.11)
RDW: 13.2 % (ref 11.5–15.5)
WBC: 4.3 10*3/uL (ref 4.0–10.5)
nRBC: 0 % (ref 0.0–0.2)

## 2020-08-09 LAB — COMPREHENSIVE METABOLIC PANEL
ALT: 15 U/L (ref 0–44)
AST: 20 U/L (ref 15–41)
Albumin: 3.6 g/dL (ref 3.5–5.0)
Alkaline Phosphatase: 71 U/L (ref 38–126)
Anion gap: 7 (ref 5–15)
BUN: 16 mg/dL (ref 8–23)
CO2: 26 mmol/L (ref 22–32)
Calcium: 8.5 mg/dL — ABNORMAL LOW (ref 8.9–10.3)
Chloride: 107 mmol/L (ref 98–111)
Creatinine, Ser: 1.09 mg/dL — ABNORMAL HIGH (ref 0.44–1.00)
GFR, Estimated: 54 mL/min — ABNORMAL LOW (ref >60)
Glucose, Bld: 108 mg/dL — ABNORMAL HIGH (ref 70–99)
Potassium: 3.2 mmol/L — ABNORMAL LOW (ref 3.5–5.1)
Sodium: 140 mmol/L (ref 135–145)
Total Bilirubin: 0.5 mg/dL (ref 0.3–1.2)
Total Protein: 6.2 g/dL — ABNORMAL LOW (ref 6.5–8.1)

## 2020-08-09 LAB — HEPATITIS C ANTIBODY: HCV Ab: NONREACTIVE

## 2020-08-09 LAB — TECHNOLOGIST SMEAR REVIEW
Plt Morphology: ADEQUATE
WBC MORPHOLOGY: ABNORMAL

## 2020-08-09 LAB — IMMATURE PLATELET FRACTION: Immature Platelet Fraction: 8.2 % (ref 1.2–8.6)

## 2020-08-09 LAB — C-REACTIVE PROTEIN: CRP: 0.7 mg/dL (ref <1.0)

## 2020-08-09 LAB — TSH: TSH: 1.628 u[IU]/mL (ref 0.350–4.500)

## 2020-08-09 NOTE — Progress Notes (Signed)
Hematology/Oncology Follow Up Note [facility]  [phone] [phone]  Patient Care Team: Marinda Elk, MD as PCP - General (Physician Assistant)   Name of the patient: Courtney Wilcox  [id number]  Oct 14, 1945   REASON FOR VISIT Follow up for pancytopenia   INTERVAL HISTORY KEASHA MALKIEWICZ is a 75 y.o. female who has above history reviewed by me today presents for follow up visit for management of pancytopenia. Patient was seen by Beckey Rutter 2 weeks ago and had blood work done.  Presents to discuss lab results.  Accompanied by her daughter.  Reports feeling tired. She has insomnia..   She had chronic LLQ abdominal pain and CT 06/25/20 did not showed any acute process to explain her chronic left lower quadrant pain.  08/04/2020 colonoscopy by Dr.Byrnett, showed diverticulosis in the sigmoid colon. No specimen was collected. She has Rheumatoid arthritis, follows up with Dr.Kernodle.  She is on Leflutamide and recently resumed on plaquenil. She previously was off plaquenil for about 6 months.      Review of Systems  Constitutional:  Positive for fatigue. Negative for appetite change, chills and fever.  HENT:   Negative for hearing loss and voice change.   Eyes:  Negative for eye problems.  Respiratory:  Negative for chest tightness and cough.   Cardiovascular:  Negative for chest pain.  Gastrointestinal:  Positive for abdominal pain. Negative for abdominal distention and blood in stool.  Endocrine: Negative for hot flashes.  Genitourinary:  Negative for difficulty urinating and frequency.   Musculoskeletal:  Negative for arthralgias.  Skin:  Negative for itching and rash.  Neurological:  Negative for extremity weakness.  Hematological:  Negative for adenopathy.  Psychiatric/Behavioral:  Negative for confusion.      Allergies  Allergen Reactions   Lactose Other (See Comments) and Diarrhea   Other Other (See Comments)     Pt ONLY tolerates SLOW-FE (slow released iron)---upsets IBS   Biaxin [Clarithromycin] Other (See Comments)    GI upset    Hydrocodone-Acetaminophen Itching    Tolerates acetaminophen    Oxycodone Itching   Sulfa Antibiotics Other (See Comments) and Diarrhea    GI upset  GI upset GI upset    Sulfasalazine Diarrhea     Past Medical History:  Diagnosis Date   Allergic rhinitis    Anemia    Anxiety    Arthritis    Asthma    Atrophic vaginitis    B12 deficiency    Bronchitis, chronic (HCC)    Cervical disc disease    Collagen vascular disease (HCC)    RA   Depression    Fibrocystic breast disease    Foot ulcer (HCC)    GERD (gastroesophageal reflux disease)    Hammer toe    Hyperlipidemia    IBS (irritable bowel syndrome)    Obesity    Obstructive sleep apnea    OSA (obstructive sleep apnea)    Osteoarthrosis    Multi site   Osteopenia    Rheumatoid arthritis ([place])    Shingles    Thyroid nodule    MULTIPLE   Tonsillitis    recurrent   Umbilical hernia      Past Surgical History:  Procedure Laterality Date   BACK SURGERY  2011   LAMINECTOMY   BREAST BIOPSY Right    neg   CERVICAL FUSION  2011, 2012   X 2   COLONOSCOPY  2005, 2015   COLONOSCOPY WITH PROPOFOL N/A 08/04/2020  Procedure: COLONOSCOPY WITH PROPOFOL;  Surgeon: Robert Bellow, MD;  Location: [facility] ENDOSCOPY;  Service: Endoscopy;  Laterality: N/A;   ESOPHAGOGASTRODUODENOSCOPY ENDOSCOPY  2005, 2013, 2015   EYE SURGERY Bilateral    Cataract Extraction with IOL    HERNIA REPAIR     JOINT REPLACEMENT     KNEE ARTHROPLASTY Left 07/12/2016   Procedure: COMPUTER ASSISTED TOTAL KNEE ARTHROPLASTY;  Surgeon: Dereck Leep, MD;  Location: [facility] ORS;  Service: Orthopedics;  Laterality: Left;   Laryngeal tear after intubation w/repair     LUMBAR DISC SURGERY  12/2009   REPLACEMENT TOTAL KNEE Right 06/2007   DR. HOOTEN, [facility]   REPLACEMENT UNICONDYLAR JOINT KNEE     DR. HOOTEN, [facility]   SHOULDER  ARTHROSCOPY WITH ROTATOR CUFF REPAIR Right    TONSILLECTOMY     TOTAL HIP ARTHROPLASTY Right 01/31/2017   Procedure: TOTAL HIP ARTHROPLASTY;  Surgeon: Dereck Leep, MD;  Location: [facility] ORS;  Service: Orthopedics;  Laterality: Right;   TUBAL LIGATION     UPPER GI ENDOSCOPY      Social History   Socioeconomic History   Marital status: Married    Spouse name: Not on file   Number of children: Not on file   Years of education: Not on file   Highest education level: Not on file  Occupational History   Not on file  Tobacco Use   Smoking status: Never   Smokeless tobacco: Never  Vaping Use   Vaping Use: Never used  Substance and Sexual Activity   Alcohol use: No   Drug use: No   Sexual activity: Not on file  Other Topics Concern   Not on file  Social History Narrative   Not on file   Social Determinants of Health   Financial Resource Strain: Not on file  Food Insecurity: Not on file  Transportation Needs: Not on file  Physical Activity: Not on file  Stress: Not on file  Social Connections: Not on file  Intimate Partner Violence: Not on file    Family History  Problem Relation Age of Onset   Asthma Mother    Breast cancer Mother 73   Emphysema Father        smoked   Heart disease Father    Lung cancer Father        smoked     Current Outpatient Medications:    acetaminophen (TYLENOL) 500 MG tablet, Take 1,000 mg by mouth 3 (three) times daily as needed for mild pain. , Disp: , Rfl:    albuterol (VENTOLIN HFA) 108 (90 Base) MCG/ACT inhaler, SMARTSIG:2 Inhalation Via Inhaler Every 6 Hours PRN, Disp: , Rfl:    ALPRAZolam (XANAX) 0.5 MG tablet, Take by mouth daily as needed., Disp: , Rfl:    budesonide (PULMICORT) 0.25 MG/2ML nebulizer solution, Inhale into the lungs., Disp: , Rfl:    budesonide-formoterol (SYMBICORT) 160-4.5 MCG/ACT inhaler, Inhale 2 puffs into the lungs 2 (two) times daily as needed., Disp: , Rfl:    Cholecalciferol (VITAMIN D) 2000 units tablet,  Take 4,000 Units by mouth daily. , Disp: , Rfl:    citalopram (CELEXA) 20 MG tablet, Take 20 mg by mouth every evening. , Disp: , Rfl: 5   Cyanocobalamin 1000 MCG/ML KIT, Inject as directed every 30 (thirty) days., Disp: , Rfl:    dicyclomine (BENTYL) 10 MG capsule, Take by mouth 3 (three) times daily as needed., Disp: , Rfl:    estradiol (ESTRACE) 0.1 MG/GM vaginal cream, Insert pea size  amount vaginally nightly x 2 weeks, then every other night x 2 weeks, then 2-3 times weekly for maintenance, Disp: , Rfl:    famotidine (PEPCID) 20 MG tablet, Take by mouth., Disp: , Rfl:    ferrous sulfate 325 (65 FE) MG tablet, Take 1 tablet by mouth daily with breakfast., Disp: , Rfl:    hydroxychloroquine (PLAQUENIL) 200 MG tablet, Take 1 tablet (200 mg total) by mouth daily. (Patient taking differently: Take 200 mg by mouth daily with lunch.), Disp: 1 tablet, Rfl: 0   ibuprofen (ADVIL,MOTRIN) 200 MG tablet, Take 400 mg by mouth every 6 (six) hours as needed (for pain.)., Disp: , Rfl:    leflunomide (ARAVA) 20 MG tablet, Take by mouth., Disp: , Rfl:    levalbuterol (XOPENEX) 1.25 MG/3ML nebulizer solution, Inhale into the lungs., Disp: , Rfl:    meclizine (ANTIVERT) 12.5 MG tablet, PLEASE SEE ATTACHED FOR DETAILED DIRECTIONS, Disp: , Rfl:    meloxicam (MOBIC) 7.5 MG tablet, Take 7.5-15 mg by mouth See admin instructions. Take 1 tablet (7.5 mg) twice daily or 2 tablets (15 mg) in the evening., Disp: , Rfl: 0   montelukast (SINGULAIR) 10 MG tablet, Take 10 mg by mouth daily as needed (for allergies.). , Disp: , Rfl:    Multiple Vitamins-Minerals (CENTRUM SILVER 50+WOMEN PO), Take by mouth., Disp: , Rfl:    naproxen sodium (ALEVE) 220 MG tablet, Take 220 mg by mouth 2 (two) times daily as needed (for pain.)., Disp: , Rfl:    omeprazole (PRILOSEC) 40 MG capsule, Take by mouth., Disp: , Rfl:    Potassium 99 MG TABS, Take 99 mg by mouth daily with lunch. , Disp: , Rfl:    potassium chloride (KLOR-CON) 10 MEQ tablet,  Take by mouth., Disp: , Rfl:    prednisoLONE acetate (PRED FORTE) 1 % ophthalmic suspension, 1 drop 4 (four) times daily., Disp: , Rfl:    pseudoephedrine (SUDAFED) 30 MG tablet, Take 30 mg by mouth every 4 (four) hours as needed for congestion., Disp: , Rfl:    Simethicone 180 MG CAPS, Take 1-2 capsules by mouth as needed., Disp: , Rfl:    Spacer/Aero-Holding Chambers (E-Z SPACER) inhaler, Use as instructed, Disp: 1 each, Rfl: 2   triamcinolone (NASACORT) 55 MCG/ACT AERO nasal inhaler, Place 2 sprays into the nose daily., Disp: , Rfl:    fluconazole (DIFLUCAN) 150 MG tablet, Take 150 mg by mouth once., Disp: , Rfl:    fluticasone furoate-vilanterol (BREO ELLIPTA) 100-25 MCG/INH AEPB, Inhale into the lungs., Disp: , Rfl:    metoprolol succinate (TOPROL-XL) 25 MG 24 hr tablet, Take 1 tablet by mouth daily. (Patient not taking: Reported on 08/09/2020), Disp: , Rfl:   Physical exam:  Vitals:   08/09/20 1520  BP: 138/61  Pulse: 72  Resp: 18  Temp: 98.1 F (36.7 C)  TempSrc: Oral  SpO2: 99%   Physical Exam Constitutional:      General: She is not in acute distress.    Comments: She ambulates independantly  HENT:     Head: Normocephalic and atraumatic.  Eyes:     General: No scleral icterus. Cardiovascular:     Rate and Rhythm: Normal rate and regular rhythm.     Heart sounds: Normal heart sounds.  Pulmonary:     Effort: Pulmonary effort is normal. No respiratory distress.     Breath sounds: No wheezing.  Abdominal:     General: Bowel sounds are normal. There is no distension.     Palpations:  Abdomen is soft.  Musculoskeletal:        General: No deformity. Normal range of motion.     Cervical back: Normal range of motion and neck supple.  Skin:    General: Skin is warm and dry.     Findings: No erythema or rash.  Neurological:     Mental Status: She is alert and oriented to person, place, and time. Mental status is at baseline.     Cranial Nerves: No cranial nerve deficit.      Coordination: Coordination normal.  Psychiatric:        Mood and Affect: Mood normal.    CMP Latest Ref Rng & Units 08/09/2020  Glucose 70 - 99 mg/dL 108(H)  BUN 8 - 23 mg/dL 16  Creatinine 0.44 - 1.00 mg/dL 1.09(H)  Sodium 135 - 145 mmol/L 140  Potassium 3.5 - 5.1 mmol/L 3.2(L)  Chloride 98 - 111 mmol/L 107  CO2 22 - 32 mmol/L 26  Calcium 8.9 - 10.3 mg/dL 8.5(L)  Total Protein 6.5 - 8.1 g/dL 6.2(L)  Total Bilirubin 0.3 - 1.2 mg/dL 0.5  Alkaline Phos 38 - 126 U/L 71  AST 15 - 41 U/L 20  ALT 0 - 44 U/L 15   CBC Latest Ref Rng & Units 08/09/2020  WBC 4.0 - 10.5 K/uL 4.3  Hemoglobin 12.0 - 15.0 g/dL 11.7(L)  Hematocrit 36.0 - 46.0 % 35.9(L)  Platelets 150 - 400 K/uL 137(L)    RADIOGRAPHIC STUDIES: I have personally reviewed the radiological images as listed and agreed with the findings in the report. No results found.   Assessment and plan 1. Anemia, unspecified type   2. Thrombocytopenia ([place])   3. Other fatigue     #  Normocytic anemia,  Labs are reviewed and discussed with patient. Normal Iron panel, negative DAT, normal B12 and folate, normal ESR, fibrinogen,normal haptoglobin, slightly increase LDH, no M protein on SPEP, negative ANA Probably due to Leflutamide, plaquenil side effects, and/or autoimmune disease. Check retic panel, smear, Zinc Anemia is mild, recommend continue observation.   # Thrombocytopenia Negative platelet antibody. Hep B surface antigen negative.  check immature platelet fraction, Hep C, flowcytometry.  Recommend observation.   # Fatigue, check TSH   Orders Placed This Encounter  Procedures   Retic Panel    Standing Status:   Future    Number of Occurrences:   1    Standing Expiration Date:   08/09/2021   Immature Platelet Fraction    Standing Status:   Future    Number of Occurrences:   1    Standing Expiration Date:   08/09/2021   TSH    Standing Status:   Future    Number of Occurrences:   1    Standing Expiration Date:    08/09/2021   Flow cytometry panel-leukemia/lymphoma work-up    Standing Status:   Future    Number of Occurrences:   1    Standing Expiration Date:   08/09/2021   Zinc    Standing Status:   Future    Number of Occurrences:   1    Standing Expiration Date:   08/09/2021   CBC with Differential/Platelet    Standing Status:   Future    Standing Expiration Date:   08/09/2021   Comprehensive metabolic panel    Standing Status:   Future    Standing Expiration Date:   08/09/2021   Technologist smear review    Standing Status:   Future  Standing Expiration Date:   08/09/2021   Hepatitis C antibody    Standing Status:   Future    Number of Occurrences:   1    Standing Expiration Date:   08/09/2021     Follow up in 4 months.    Earlie Server, MD, PhD Hematology Oncology [facility] Pager- [phone] 08/09/2020

## 2020-08-10 LAB — ZINC: Zinc: 66 ug/dL (ref 44–115)

## 2020-08-11 LAB — COMP PANEL: LEUKEMIA/LYMPHOMA

## 2020-08-12 ENCOUNTER — Ambulatory Visit (INDEPENDENT_AMBULATORY_CARE_PROVIDER_SITE_OTHER): Payer: Medicare Other

## 2020-08-12 ENCOUNTER — Other Ambulatory Visit: Payer: Self-pay

## 2020-08-12 ENCOUNTER — Ambulatory Visit (INDEPENDENT_AMBULATORY_CARE_PROVIDER_SITE_OTHER): Payer: Medicare Other | Admitting: Vascular Surgery

## 2020-08-12 ENCOUNTER — Encounter (INDEPENDENT_AMBULATORY_CARE_PROVIDER_SITE_OTHER): Payer: Self-pay | Admitting: Vascular Surgery

## 2020-08-12 VITALS — BP 147/82 | HR 66 | Resp 15 | Wt 191.4 lb

## 2020-08-12 DIAGNOSIS — M8949 Other hypertrophic osteoarthropathy, multiple sites: Secondary | ICD-10-CM

## 2020-08-12 DIAGNOSIS — I872 Venous insufficiency (chronic) (peripheral): Secondary | ICD-10-CM

## 2020-08-12 DIAGNOSIS — I6523 Occlusion and stenosis of bilateral carotid arteries: Secondary | ICD-10-CM | POA: Diagnosis not present

## 2020-08-12 DIAGNOSIS — M159 Polyosteoarthritis, unspecified: Secondary | ICD-10-CM

## 2020-08-12 DIAGNOSIS — E782 Mixed hyperlipidemia: Secondary | ICD-10-CM

## 2020-08-12 DIAGNOSIS — J45909 Unspecified asthma, uncomplicated: Secondary | ICD-10-CM

## 2020-08-12 NOTE — Progress Notes (Signed)
MRN : [id number]  Courtney Wilcox is a 75 y.o. ([birth year]) female who presents with chief complaint of No chief complaint on file. Marland Kitchen  History of Present Illness:   The patient is seen for evaluation of carotid stenosis. The carotid stenosis was identified after a duplex ultrasound of the carotid arteries obtained at [facility] clinic on 01/06/2020.   The patient denies amaurosis fugax. There is no recent history of TIA symptoms or focal motor deficits. There is no prior documented CVA.   There is no history of migraine headaches. There is no history of seizures.   The patient is taking enteric-coated aspirin 81 mg daily.  The patient is also seen for venous insufficiency.  The swelling has improved quite a bit and the pain associated with swelling has decreased substantially. There have not been any interval development of a ulcerations or wounds.  Since the previous visit the patient has been wearing graduated compression stockings and has noted little significant improvement in the lymphedema. The patient has been using compression routinely morning until night.  The patient also states elevation during the day and exercise is being done too.   The patient has a history of coronary artery disease, no recent episodes of angina or shortness of breath. The patient denies PAD or claudication symptoms. There is a history of hyperlipidemia which is being treated with a statin.    Duplex ultrasound of the carotid arteries done today shows RICA<40% and LICA <40%.  Previous duplex ultrasound from [facility] dated 01/06/2020 is reported as less than 70% bilateral ICA  No outpatient medications have been marked as taking for the 08/12/20 encounter (Appointment) with Gilda Crease, Latina Craver, MD.    Past Medical History:  Diagnosis Date   Allergic rhinitis    Anemia    Anxiety    Arthritis    Asthma    Atrophic vaginitis    B12 deficiency    Bronchitis, chronic (HCC)    Cervical disc disease     Collagen vascular disease (HCC)    RA   Depression    Fibrocystic breast disease    Foot ulcer (HCC)    GERD (gastroesophageal reflux disease)    Hammer toe    Hyperlipidemia    IBS (irritable bowel syndrome)    Obesity    Obstructive sleep apnea    OSA (obstructive sleep apnea)    Osteoarthrosis    Multi site   Osteopenia    Rheumatoid arthritis (HCC)    Shingles    Thyroid nodule    MULTIPLE   Tonsillitis    recurrent   Umbilical hernia     Past Surgical History:  Procedure Laterality Date   BACK SURGERY  2011   LAMINECTOMY   BREAST BIOPSY Right    neg   CERVICAL FUSION  2011, 2012   X 2   COLONOSCOPY  2005, 2015   COLONOSCOPY WITH PROPOFOL N/A 08/04/2020   Procedure: COLONOSCOPY WITH PROPOFOL;  Surgeon: Earline Mayotte, MD;  Location: [facility] ENDOSCOPY;  Service: Endoscopy;  Laterality: N/A;   ESOPHAGOGASTRODUODENOSCOPY ENDOSCOPY  2005, 2013, 2015   EYE SURGERY Bilateral    Cataract Extraction with IOL    HERNIA REPAIR     JOINT REPLACEMENT     KNEE ARTHROPLASTY Left 07/12/2016   Procedure: COMPUTER ASSISTED TOTAL KNEE ARTHROPLASTY;  Surgeon: Donato Heinz, MD;  Location: [facility] ORS;  Service: Orthopedics;  Laterality: Left;   Laryngeal tear after intubation w/repair     LUMBAR DISC  SURGERY  12/2009   REPLACEMENT TOTAL KNEE Right 06/2007   DR. HOOTEN, [facility]   REPLACEMENT UNICONDYLAR JOINT KNEE     DR. HOOTEN, [facility]   SHOULDER ARTHROSCOPY WITH ROTATOR CUFF REPAIR Right    TONSILLECTOMY     TOTAL HIP ARTHROPLASTY Right 01/31/2017   Procedure: TOTAL HIP ARTHROPLASTY;  Surgeon: Donato Heinz, MD;  Location: [facility] ORS;  Service: Orthopedics;  Laterality: Right;   TUBAL LIGATION     UPPER GI ENDOSCOPY      Social History Social History   Tobacco Use   Smoking status: Never   Smokeless tobacco: Never  Vaping Use   Vaping Use: Never used  Substance Use Topics   Alcohol use: No   Drug use: No    Family History Family History  Problem Relation Age of  Onset   Asthma Mother    Breast cancer Mother 73   Emphysema Father        smoked   Heart disease Father    Lung cancer Father        smoked    Allergies  Allergen Reactions   Lactose Other (See Comments) and Diarrhea   Other Other (See Comments)    Pt ONLY tolerates SLOW-FE (slow released iron)---upsets IBS   Biaxin [Clarithromycin] Other (See Comments)    GI upset    Hydrocodone-Acetaminophen Itching    Tolerates acetaminophen    Oxycodone Itching   Sulfa Antibiotics Other (See Comments) and Diarrhea    GI upset  GI upset GI upset    Sulfasalazine Diarrhea     REVIEW OF SYSTEMS (Negative unless checked)  Constitutional: [] Weight loss  [] Fever  [] Chills Cardiac: [] Chest pain   [] Chest pressure   [] Palpitations   [] Shortness of breath when laying flat   [] Shortness of breath with exertion. Vascular:  [] Pain in legs with walking   [] Pain in legs at rest  [] History of DVT   [] Phlebitis   [x] Swelling in legs   [x] Varicose veins   [] Non-healing ulcers Pulmonary:   [] Uses home oxygen   [] Productive cough   [] Hemoptysis   [] Wheeze  [] COPD   [] Asthma Neurologic:  [] Dizziness   [] Seizures   [] History of stroke   [] History of TIA  [] Aphasia   [] Vissual changes   [] Weakness or numbness in arm   [] Weakness or numbness in leg Musculoskeletal:   [] Joint swelling   [] Joint pain   [] Low back pain Hematologic:  [] Easy bruising  [] Easy bleeding   [] Hypercoagulable state   [] Anemic Gastrointestinal:  [] Diarrhea   [] Vomiting  [] Gastroesophageal reflux/heartburn   [] Difficulty swallowing. Genitourinary:  [] Chronic kidney disease   [] Difficult urination  [] Frequent urination   [] Blood in urine Skin:  [] Rashes   [] Ulcers  Psychological:  [] History of anxiety   []  History of major depression.  Physical Examination  There were no vitals filed for this visit. There is no height or weight on file to calculate BMI. Gen: WD/WN, NAD Head: [place]/AT, No temporalis wasting.  Ear/Nose/Throat: Hearing  grossly intact, nares w/o erythema or drainage Eyes: PER, EOMI, sclera nonicteric.  Neck: Supple, no large masses.   Pulmonary:  Good air movement, no audible wheezing bilaterally, no use of accessory muscles.  Cardiac: RRR, no JVD Vascular:   bilateral carotid bruits; scattered varicosities present bilaterally.  Moderate venous stasis changes to the legs bilaterally.  1+ soft pitting edema  Vessel Right Left  Radial Palpable Palpable  Carotid Palpable Palpable  PT Palpable Palpable  DP Palpable Palpable  Gastrointestinal: Non-distended.  No guarding/no peritoneal signs.  Musculoskeletal: M/S 5/5 throughout.  No deformity or atrophy.  Neurologic: CN 2-12 intact. Symmetrical.  Speech is fluent. Motor exam as listed above. Psychiatric: Judgment intact, Mood & affect appropriate for pt's clinical situation. Dermatologic: Moderate venous rashes no ulcers noted.  No changes consistent with cellulitis. Lymph : No lichenification or skin changes of chronic lymphedema.  CBC Lab Results  Component Value Date   WBC 4.3 08/09/2020   HGB 11.7 (L) 08/09/2020   HCT 35.9 (L) 08/09/2020   MCV 98.9 08/09/2020   PLT 137 (L) 08/09/2020    BMET    Component Value Date/Time   NA 140 08/09/2020 1453   K 3.2 (L) 08/09/2020 1453   CL 107 08/09/2020 1453   CO2 26 08/09/2020 1453   GLUCOSE 108 (H) 08/09/2020 1453   BUN 16 08/09/2020 1453   CREATININE 1.09 (H) 08/09/2020 1453   CALCIUM 8.5 (L) 08/09/2020 1453   GFRNONAA 54 (L) 08/09/2020 1453   GFRAA >60 06/14/2017 1513   Estimated Creatinine Clearance: 46.5 mL/min (A) (by C-G formula based on SCr of 1.09 mg/dL (H)).  COAG Lab Results  Component Value Date   INR 0.97 01/25/2017   INR 0.96 06/28/2016    Radiology No results found.   Assessment/Plan 1. Bilateral carotid artery stenosis Recommend:  Given the patient's asymptomatic subcritical stenosis no further invasive testing or surgery at this time.  Duplex ultrasound shows <40%  stenosis bilaterally.  Continue antiplatelet therapy as prescribed Continue management of CAD, HTN and Hyperlipidemia Healthy heart diet,  encouraged exercise at least 4 times per week Follow up in 12 months with duplex ultrasound and physical exam.    - VAS US CAROTID; Future  2. Chronic venous insufficiency No surgery or intervention at this point in time.    I have had a long discussion with the patient regarding venous insufficiency and why it  causes symptoms. I have discussed with the patient the chronic skin changes that accompany venous insufficiency and the long term sequela such as infection and ulceration.  Patient will begin wearing graduated compression stockings class 1 (20-30 mmHg) or compression wraps on a daily basis a prescription was given. The patient will put the stockings on first thing in the morning and removing them in the evening. The patient is instructed specifically not to sleep in the stockings.    In addition, behavioral modification including several periods of elevation of the lower extremities during the day will be continued. I have demonstrated that proper elevation is a position with the ankles at heart level.  The patient is instructed to begin routine exercise, especially walking on a daily basis   3. Mixed hyperlipidemia Continue statin as ordered and reviewed, no changes at this time   4. Primary osteoarthritis involving multiple joints Continue NSAID medications as already ordered, these medications have been reviewed and there are no changes at this time.  Continued activity and therapy was stressed.   5. Mild asthma without complication, unspecified whether persistent Continue pulmonary medications and aerosols as already ordered, these medications have been reviewed and there are no changes at this time.      Levora Dredge, MD  08/12/2020 11:03 AM

## 2020-08-15 ENCOUNTER — Encounter (INDEPENDENT_AMBULATORY_CARE_PROVIDER_SITE_OTHER): Payer: Self-pay | Admitting: Vascular Surgery

## 2020-08-15 DIAGNOSIS — I872 Venous insufficiency (chronic) (peripheral): Secondary | ICD-10-CM | POA: Insufficient documentation

## 2020-11-19 ENCOUNTER — Other Ambulatory Visit: Payer: Self-pay

## 2020-11-19 ENCOUNTER — Ambulatory Visit
Admission: RE | Admit: 2020-11-19 | Discharge: 2020-11-19 | Disposition: A | Discharge status: Home / Self Care | Payer: Medicare Other | Source: Ambulatory Visit | Attending: Pulmonary Disease | Admitting: Pulmonary Disease

## 2020-11-19 ENCOUNTER — Inpatient Hospital Stay: Payer: Medicare Other

## 2020-11-19 ENCOUNTER — Inpatient Hospital Stay
Admission: EM | Admit: 2020-11-19 | Discharge: 2020-11-22 | DRG: 176 | Disposition: A | Discharge status: Home / Self Care | Payer: Medicare Other | Attending: Internal Medicine | Admitting: Internal Medicine

## 2020-11-19 ENCOUNTER — Other Ambulatory Visit: Payer: Self-pay | Admitting: Pulmonary Disease

## 2020-11-19 DIAGNOSIS — Z8679 Personal history of other diseases of the circulatory system: Secondary | ICD-10-CM

## 2020-11-19 DIAGNOSIS — Z825 Family history of asthma and other chronic lower respiratory diseases: Secondary | ICD-10-CM

## 2020-11-19 DIAGNOSIS — J45909 Unspecified asthma, uncomplicated: Secondary | ICD-10-CM | POA: Diagnosis present

## 2020-11-19 DIAGNOSIS — I4892 Unspecified atrial flutter: Secondary | ICD-10-CM | POA: Diagnosis present

## 2020-11-19 DIAGNOSIS — Z96641 Presence of right artificial hip joint: Secondary | ICD-10-CM | POA: Diagnosis present

## 2020-11-19 DIAGNOSIS — I480 Paroxysmal atrial fibrillation: Secondary | ICD-10-CM | POA: Diagnosis present

## 2020-11-19 DIAGNOSIS — I2694 Multiple subsegmental pulmonary emboli without acute cor pulmonale: Principal | ICD-10-CM | POA: Diagnosis present

## 2020-11-19 DIAGNOSIS — J42 Unspecified chronic bronchitis: Secondary | ICD-10-CM | POA: Diagnosis present

## 2020-11-19 DIAGNOSIS — M069 Rheumatoid arthritis, unspecified: Secondary | ICD-10-CM | POA: Diagnosis present

## 2020-11-19 DIAGNOSIS — E782 Mixed hyperlipidemia: Secondary | ICD-10-CM

## 2020-11-19 DIAGNOSIS — I2699 Other pulmonary embolism without acute cor pulmonale: Secondary | ICD-10-CM | POA: Diagnosis not present

## 2020-11-19 DIAGNOSIS — E538 Deficiency of other specified B group vitamins: Secondary | ICD-10-CM | POA: Diagnosis present

## 2020-11-19 DIAGNOSIS — Z86718 Personal history of other venous thrombosis and embolism: Secondary | ICD-10-CM

## 2020-11-19 DIAGNOSIS — Z981 Arthrodesis status: Secondary | ICD-10-CM | POA: Diagnosis not present

## 2020-11-19 DIAGNOSIS — Z20822 Contact with and (suspected) exposure to covid-19: Secondary | ICD-10-CM | POA: Diagnosis present

## 2020-11-19 DIAGNOSIS — I471 Supraventricular tachycardia, unspecified: Secondary | ICD-10-CM | POA: Diagnosis present

## 2020-11-19 DIAGNOSIS — Z79899 Other long term (current) drug therapy: Secondary | ICD-10-CM | POA: Diagnosis not present

## 2020-11-19 DIAGNOSIS — Z7952 Long term (current) use of systemic steroids: Secondary | ICD-10-CM

## 2020-11-19 DIAGNOSIS — F32A Depression, unspecified: Secondary | ICD-10-CM | POA: Diagnosis present

## 2020-11-19 DIAGNOSIS — F419 Anxiety disorder, unspecified: Secondary | ICD-10-CM | POA: Diagnosis present

## 2020-11-19 DIAGNOSIS — M5090 Cervical disc disorder, unspecified, unspecified cervical region: Secondary | ICD-10-CM | POA: Diagnosis present

## 2020-11-19 DIAGNOSIS — K589 Irritable bowel syndrome without diarrhea: Secondary | ICD-10-CM | POA: Diagnosis present

## 2020-11-19 DIAGNOSIS — Z9851 Tubal ligation status: Secondary | ICD-10-CM

## 2020-11-19 DIAGNOSIS — E785 Hyperlipidemia, unspecified: Secondary | ICD-10-CM | POA: Diagnosis present

## 2020-11-19 DIAGNOSIS — Z79890 Hormone replacement therapy (postmenopausal): Secondary | ICD-10-CM | POA: Diagnosis not present

## 2020-11-19 DIAGNOSIS — M8580 Other specified disorders of bone density and structure, unspecified site: Secondary | ICD-10-CM | POA: Diagnosis present

## 2020-11-19 DIAGNOSIS — Z882 Allergy status to sulfonamides status: Secondary | ICD-10-CM

## 2020-11-19 DIAGNOSIS — Z96653 Presence of artificial knee joint, bilateral: Secondary | ICD-10-CM | POA: Diagnosis present

## 2020-11-19 DIAGNOSIS — R000 Tachycardia, unspecified: Secondary | ICD-10-CM

## 2020-11-19 DIAGNOSIS — G4733 Obstructive sleep apnea (adult) (pediatric): Secondary | ICD-10-CM | POA: Diagnosis present

## 2020-11-19 DIAGNOSIS — Z888 Allergy status to other drugs, medicaments and biological substances status: Secondary | ICD-10-CM

## 2020-11-19 DIAGNOSIS — K219 Gastro-esophageal reflux disease without esophagitis: Secondary | ICD-10-CM | POA: Diagnosis present

## 2020-11-19 DIAGNOSIS — Z885 Allergy status to narcotic agent status: Secondary | ICD-10-CM

## 2020-11-19 DIAGNOSIS — J4541 Moderate persistent asthma with (acute) exacerbation: Secondary | ICD-10-CM | POA: Insufficient documentation

## 2020-11-19 LAB — CBC WITH DIFFERENTIAL/PLATELET
Abs Immature Granulocytes: 0.02 10*3/uL (ref 0.00–0.07)
Basophils Absolute: 0.1 10*3/uL (ref 0.0–0.1)
Basophils Relative: 1 %
Eosinophils Absolute: 0.1 10*3/uL (ref 0.0–0.5)
Eosinophils Relative: 1 %
HCT: 36.2 % (ref 36.0–46.0)
Hemoglobin: 12 g/dL (ref 12.0–15.0)
Immature Granulocytes: 0 %
Lymphocytes Relative: 8 %
Lymphs Abs: 0.5 10*3/uL — ABNORMAL LOW (ref 0.7–4.0)
MCH: 33 pg (ref 26.0–34.0)
MCHC: 33.1 g/dL (ref 30.0–36.0)
MCV: 99.5 fL (ref 80.0–100.0)
Monocytes Absolute: 0.2 10*3/uL (ref 0.1–1.0)
Monocytes Relative: 3 %
Neutro Abs: 5.6 10*3/uL (ref 1.7–7.7)
Neutrophils Relative %: 87 %
Platelets: 139 10*3/uL — ABNORMAL LOW (ref 150–400)
RBC: 3.64 MIL/uL — ABNORMAL LOW (ref 3.87–5.11)
RDW: 14.1 % (ref 11.5–15.5)
WBC: 6.5 10*3/uL (ref 4.0–10.5)
nRBC: 0 % (ref 0.0–0.2)

## 2020-11-19 LAB — COMPREHENSIVE METABOLIC PANEL
ALT: 21 U/L (ref 0–44)
AST: 23 U/L (ref 15–41)
Albumin: 3.6 g/dL (ref 3.5–5.0)
Alkaline Phosphatase: 84 U/L (ref 38–126)
Anion gap: 9 (ref 5–15)
BUN: 13 mg/dL (ref 8–23)
CO2: 25 mmol/L (ref 22–32)
Calcium: 8.8 mg/dL — ABNORMAL LOW (ref 8.9–10.3)
Chloride: 107 mmol/L (ref 98–111)
Creatinine, Ser: 1.21 mg/dL — ABNORMAL HIGH (ref 0.44–1.00)
GFR, Estimated: 47 mL/min — ABNORMAL LOW (ref >60)
Glucose, Bld: 114 mg/dL — ABNORMAL HIGH (ref 70–99)
Potassium: 3.7 mmol/L (ref 3.5–5.1)
Sodium: 141 mmol/L (ref 135–145)
Total Bilirubin: 0.8 mg/dL (ref 0.3–1.2)
Total Protein: 6.8 g/dL (ref 6.5–8.1)

## 2020-11-19 LAB — PROTIME-INR
INR: 1 (ref 0.8–1.2)
Prothrombin Time: 13 seconds (ref 11.4–15.2)

## 2020-11-19 LAB — RESP PANEL BY RT-PCR (FLU A&B, COVID) ARPGX2
Influenza A by PCR: NEGATIVE
Influenza B by PCR: NEGATIVE
SARS Coronavirus 2 by RT PCR: NEGATIVE

## 2020-11-19 LAB — TROPONIN I (HIGH SENSITIVITY)
Troponin I (High Sensitivity): 17 ng/L (ref <18)
Troponin I (High Sensitivity): 17 ng/L (ref <18)

## 2020-11-19 LAB — POCT I-STAT CREATININE: Creatinine, Ser: 1.1 mg/dL — ABNORMAL HIGH (ref 0.44–1.00)

## 2020-11-19 LAB — APTT: aPTT: 25 seconds (ref 24–36)

## 2020-11-19 MED ORDER — METOPROLOL TARTRATE 5 MG/5ML IV SOLN: 5.0000 mg | Freq: Once | INTRAVENOUS | Status: AC

## 2020-11-19 MED ORDER — ACETAMINOPHEN 650 MG RE SUPP
650.0000 mg | Freq: Four times a day (QID) | RECTAL | Status: DC | PRN
  Filled 2020-11-19: qty 1

## 2020-11-19 MED ORDER — IOHEXOL 350 MG/ML SOLN
75.0000 mL | Freq: Once | INTRAVENOUS | Status: AC | PRN
  Administered 2020-11-19: 75 mL via INTRAVENOUS

## 2020-11-19 MED ORDER — ACETAMINOPHEN 325 MG PO TABS
650.0000 mg | ORAL_TABLET | Freq: Four times a day (QID) | ORAL | Status: DC | PRN
  Administered 2020-11-19 – 2020-11-21 (×3): 650 mg via ORAL
  Filled 2020-11-19 (×3): qty 2

## 2020-11-19 MED ORDER — HYDROXYCHLOROQUINE SULFATE 200 MG PO TABS
200.0000 mg | ORAL_TABLET | Freq: Every day | ORAL | Status: DC
  Administered 2020-11-20 – 2020-11-21 (×2): 200 mg via ORAL
  Filled 2020-11-19 (×3): qty 1

## 2020-11-19 MED ORDER — SODIUM CHLORIDE 0.9 % IV BOLUS
500.0000 mL | Freq: Once | INTRAVENOUS | Status: AC
  Administered 2020-11-19: 500 mL via INTRAVENOUS

## 2020-11-19 MED ORDER — DILTIAZEM HCL-DEXTROSE 125-5 MG/125ML-% IV SOLN (PREMIX)
5.0000 mg/h | INTRAVENOUS | Status: DC
  Administered 2020-11-19 – 2020-11-21 (×2): 5 mg/h via INTRAVENOUS
  Filled 2020-11-19 (×2): qty 125

## 2020-11-19 MED ORDER — DILTIAZEM LOAD VIA INFUSION
10.0000 mg | Freq: Once | INTRAVENOUS | Status: DC
  Filled 2020-11-19: qty 10

## 2020-11-19 MED ORDER — MELATONIN 5 MG PO TABS
10.0000 mg | ORAL_TABLET | Freq: Every evening | ORAL | Status: DC | PRN
  Administered 2020-11-20: 10 mg via ORAL
  Filled 2020-11-19 (×3): qty 2

## 2020-11-19 MED ORDER — POTASSIUM CHLORIDE 10 MEQ/100ML IV SOLN
10.0000 meq | INTRAVENOUS | Status: AC
  Administered 2020-11-19: 10 meq via INTRAVENOUS
  Filled 2020-11-19 (×2): qty 100

## 2020-11-19 MED ORDER — APIXABAN 5 MG PO TABS
10.0000 mg | ORAL_TABLET | Freq: Two times a day (BID) | ORAL | Status: DC
  Administered 2020-11-19 – 2020-11-22 (×6): 10 mg via ORAL
  Filled 2020-11-19 (×6): qty 2

## 2020-11-19 MED ORDER — METOPROLOL TARTRATE 5 MG/5ML IV SOLN
2.5000 mg | Freq: Once | INTRAVENOUS | Status: AC
  Administered 2020-11-19: 2.5 mg via INTRAVENOUS
  Filled 2020-11-19: qty 5

## 2020-11-19 MED ORDER — ADENOSINE 6 MG/2ML IV SOLN: 6.0000 mg | Freq: Once | INTRAVENOUS | Status: AC

## 2020-11-19 MED ORDER — ONDANSETRON HCL 4 MG/2ML IJ SOLN: 4.0000 mg | Freq: Four times a day (QID) | INTRAMUSCULAR | Status: DC | PRN

## 2020-11-19 MED ORDER — ADENOSINE 6 MG/2ML IV SOLN
INTRAVENOUS | Status: AC
  Administered 2020-11-19: 6 mg via INTRAVENOUS
  Filled 2020-11-19: qty 2

## 2020-11-19 MED ORDER — METOPROLOL TARTRATE 5 MG/5ML IV SOLN
2.5000 mg | Freq: Once | INTRAVENOUS | Status: AC
  Administered 2020-11-19: 2.5 mg via INTRAVENOUS

## 2020-11-19 MED ORDER — MAGNESIUM SULFATE 2 GM/50ML IV SOLN
2.0000 g | Freq: Once | INTRAVENOUS | Status: AC
  Administered 2020-11-19: 2 g via INTRAVENOUS
  Filled 2020-11-19: qty 50

## 2020-11-19 MED ORDER — PANTOPRAZOLE SODIUM 40 MG PO TBEC
40.0000 mg | DELAYED_RELEASE_TABLET | Freq: Every day | ORAL | Status: DC
  Administered 2020-11-20 – 2020-11-22 (×3): 40 mg via ORAL
  Filled 2020-11-19 (×3): qty 1

## 2020-11-19 MED ORDER — APIXABAN 5 MG PO TABS: 5.0000 mg | ORAL_TABLET | Freq: Two times a day (BID) | ORAL | Status: DC

## 2020-11-19 MED ORDER — ALPRAZOLAM 0.25 MG PO TABS
0.2500 mg | ORAL_TABLET | Freq: Every day | ORAL | Status: DC | PRN
  Administered 2020-11-19 – 2020-11-21 (×3): 0.25 mg via ORAL
  Filled 2020-11-19 (×3): qty 1

## 2020-11-19 MED ORDER — METOPROLOL TARTRATE 5 MG/5ML IV SOLN
INTRAVENOUS | Status: AC
  Administered 2020-11-19: 5 mg via INTRAVENOUS
  Filled 2020-11-19: qty 5

## 2020-11-19 MED ORDER — ONDANSETRON HCL 4 MG PO TABS: 4.0000 mg | ORAL_TABLET | Freq: Four times a day (QID) | ORAL | Status: DC | PRN

## 2020-11-19 MED ORDER — MONTELUKAST SODIUM 10 MG PO TABS: 10.0000 mg | ORAL_TABLET | Freq: Every day | ORAL | Status: DC | PRN

## 2020-11-19 MED ORDER — CITALOPRAM HYDROBROMIDE 20 MG PO TABS
20.0000 mg | ORAL_TABLET | Freq: Every evening | ORAL | Status: DC
  Administered 2020-11-20: 20 mg via ORAL
  Filled 2020-11-19 (×2): qty 1

## 2020-11-19 NOTE — Subjective & Objective (Addendum)
CC: pulmonary embolism HPI: 75 year old female with a history of rheumatoid arthritis, hyperlipidemia, asthma presents to the ER today from home.  She was seen yesterday by her pulmonologist.  She has been having increasing shortness of breath.  D-dimer was positive as an outpatient.  Patient underwent CTPA today which demonstrated bilateral subsegmental PE.  Sent to the ER for evaluation.  Patient states that she has been having more difficulty breathing of the last week.  Her inhalers have not been working.  Patient does have a history of atrial flutter.  She is not on any anticoagulation.  This is on EKG from May 2019.  Patient is followed by [facility] cardiology.  Not clear if her EKG from May 2019 was reviewed by cardiology.  Patient has not taken any anticoagulants for atrial flutter.  She has a prior history of DVT in her left leg that she says was over 30 years ago.  Patient states that intermittently over the last several months, she has noticed that she has left arm pain radiating to her left shoulder blade and back.  She is not sure if she has felt any palpitations.  In the ER, patient noted to be tachycardic with a heart rate 141.  This continued despite 5 mg of IV Lopressor.  When the patient's heart rate was in the high 130s and 140s, the patient was complaining of left arm pain and back pain.  She states this is the same pain she has had at home.  She wonders if her arm pain and back pain is related to her fast heart rate.  6 mg of IV adenosine were given which did slow her down to approximately 60 bpm.  This did show normal sinus rhythm and then the patient went back to a heart rate of approximately 135 beats a minute.  Due the patient's SVT and acute PE, patient be admitted to stepdown ICU.  Patient is currently getting 20 mEq of IV potassium and 2 g of IV magnesium.

## 2020-11-19 NOTE — Assessment & Plan Note (Signed)
Stable

## 2020-11-19 NOTE — Assessment & Plan Note (Signed)
Patient with a prior history of atrial flutter.  This was evident on her EKG from May 2019.

## 2020-11-19 NOTE — ED Triage Notes (Signed)
Pt to ED saw pulmonologist yesterday for asthma flare and d dimer was elevated, told to have CT scan today so pt came here with daughter. CT was done and showed bilateral PE s and they were called and told to come to ED. Pt has dyspnea with exertion, ongoing for about 3 weeks.  Had palpitations and cold sweats last night and felt HR was fast.HR currently 143 on monitor.  Pt has been off of metoprolol for 5 months (ran out). Hx a fib.

## 2020-11-19 NOTE — ED Notes (Signed)
Dr Darnelle Catalan informed HR still 137 after 2.5 mg metoprolol IV.

## 2020-11-19 NOTE — ED Provider Notes (Signed)
[facility] Emergency Department Provider Note  ____________________________________________   Event Date/Time   First MD Initiated Contact with Patient 11/19/20 1759     (approximate)  I have reviewed the triage vital signs and the nursing notes.   HISTORY  Chief Complaint Shortness of Breath    HPI Courtney Wilcox is a 75 y.o. female patient reports she saw her pulmonologist yesterday for asthma flare and had a high the D-dimer so she had a CAT scan ordered CAT scan shows bilateral PEs and they came to the emergency room.  Patient reports she has been short of breath with exertion for about 3 weeks.  She has had palpitations last night and this morning.  Patient has a history of A. fib/a flutter.  She was taking metoprolol 25 a day but has not had it for about 5 months because she ran out.  She has never been on a blood thinner.  She had a DVT while pregnant many years ago.         Past Medical History:  Diagnosis Date   Allergic rhinitis    Anemia    Anxiety    Arthritis    Asthma    Atrophic vaginitis    B12 deficiency    Bronchitis, chronic (HCC)    Cervical disc disease    Collagen vascular disease ([place])    RA   Depression    Fibrocystic breast disease    Foot ulcer ([place])    GERD (gastroesophageal reflux disease)    Hammer toe    Hyperlipidemia    IBS (irritable bowel syndrome)    Obesity    Obstructive sleep apnea    OSA (obstructive sleep apnea)    Osteoarthrosis    Multi site   Osteopenia    Rheumatoid arthritis ([place])    Shingles    Thyroid nodule    MULTIPLE   Tonsillitis    recurrent   Umbilical hernia     Patient Active Problem List   Diagnosis Date Noted   SVT (supraventricular tachycardia) ([place]) 11/19/2020   Acute pulmonary embolism ([place]) 11/19/2020   History of atrial flutter 11/19/2020   Chronic venous insufficiency 08/15/2020   Carotid stenosis 02/04/2020   Hyperlipidemia 02/04/2020   Multiple thyroid  nodules 05/21/2018   Thrombocytopenia ([place]) 07/31/2017   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/2017   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   Primary osteoarthritis of right hip 12/13/2016   S/P total knee arthroplasty 07/12/2016   Anemia 10/30/2013   Anxiety 10/30/2013   Osteoarthritis 07/08/2013   Osteopenia 07/08/2013   Asthma, mild 02/19/2013   Chest pain 02/19/2013   Rheumatoid arthritis ([place]) 10/30/2012   Shortness of breath 10/29/2012   Spinal stenosis in cervical region 10/29/2012    Past Surgical History:  Procedure Laterality Date   BACK SURGERY  2011   LAMINECTOMY   BREAST BIOPSY Right    neg   CERVICAL FUSION  2011, 2012   X 2   COLONOSCOPY  2005, 2015   COLONOSCOPY WITH PROPOFOL N/A 08/04/2020   Procedure: COLONOSCOPY WITH PROPOFOL;  Surgeon: Robert Bellow, MD;  Location: [facility] ENDOSCOPY;  Service: Endoscopy;  Laterality: N/A;   ESOPHAGOGASTRODUODENOSCOPY ENDOSCOPY  2005, 2013, 2015   EYE SURGERY Bilateral    Cataract Extraction with IOL    HERNIA REPAIR     JOINT REPLACEMENT     KNEE ARTHROPLASTY Left 07/12/2016   Procedure: COMPUTER ASSISTED TOTAL KNEE ARTHROPLASTY;  Surgeon: Dereck Leep, MD;  Location: [facility] ORS;  Service: Orthopedics;  Laterality: Left;   Laryngeal tear after intubation w/repair     LUMBAR DISC SURGERY  12/2009   REPLACEMENT TOTAL KNEE Right 06/2007   DR. HOOTEN, [facility]   REPLACEMENT UNICONDYLAR JOINT KNEE     DR. HOOTEN, [facility]   SHOULDER ARTHROSCOPY WITH ROTATOR CUFF REPAIR Right    TONSILLECTOMY     TOTAL HIP ARTHROPLASTY Right 01/31/2017   Procedure: TOTAL HIP ARTHROPLASTY;  Surgeon: Dereck Leep, MD;  Location: [facility] ORS;  Service: Orthopedics;  Laterality: Right;   TUBAL LIGATION     UPPER GI ENDOSCOPY      Prior to Admission medications   Medication Sig Start Date End Date Taking? Authorizing Provider  acetaminophen (TYLENOL) 500 MG tablet Take 1,000 mg by mouth 3 (three) times  daily as needed for mild pain.    Yes [provider]  albuterol (VENTOLIN HFA) 108 (90 Base) MCG/ACT inhaler SMARTSIG:2 Inhalation Via Inhaler Every 6 Hours PRN 06/17/20  Yes [provider]  ALPRAZolam Duanne Moron) 0.5 MG tablet Take by mouth daily as needed. 07/07/20  Yes [provider]  budesonide (PULMICORT) 0.25 MG/2ML nebulizer solution Inhale into the lungs. 04/01/20 04/01/21 Yes [provider]  budesonide-formoterol (SYMBICORT) 160-4.5 MCG/ACT inhaler Inhale 2 puffs into the lungs 2 (two) times daily as needed.   Yes [provider]  Cholecalciferol (VITAMIN D) 2000 units tablet Take 4,000 Units by mouth daily.    Yes [provider]  citalopram (CELEXA) 20 MG tablet Take 20 mg by mouth every evening.  06/15/16  Yes [provider]  estradiol (ESTRACE) 0.1 MG/GM vaginal cream Insert pea size amount vaginally nightly x 2 weeks, then every other night x 2 weeks, then 2-3 times weekly for maintenance 06/18/18  Yes [provider]  famotidine (PEPCID) 20 MG tablet Take by mouth.   Yes [provider]  ferrous sulfate 325 (65 FE) MG tablet Take 1 tablet by mouth daily with breakfast. 01/01/20 12/31/20 Yes [provider]  fluconazole (DIFLUCAN) 150 MG tablet Take 150 mg by mouth once. 03/19/20  Yes [provider]  hydroxychloroquine (PLAQUENIL) 200 MG tablet Take 1 tablet (200 mg total) by mouth daily. Patient taking differently: Take 200 mg by mouth daily with lunch. 07/21/16  Yes Watt Climes, PA  ibuprofen (ADVIL,MOTRIN) 200 MG tablet Take 400 mg by mouth every 6 (six) hours as needed (for pain.).   Yes [provider]  leflunomide (ARAVA) 20 MG tablet Take by mouth. 06/30/20  Yes [provider]  levalbuterol Penne Lash) 1.25 MG/3ML nebulizer solution Inhale into the lungs. 04/01/20 04/01/21 Yes [provider]  meclizine (ANTIVERT) 12.5 MG tablet PLEASE SEE ATTACHED FOR DETAILED  DIRECTIONS 10/05/18  Yes [provider]  metoprolol succinate (TOPROL-XL) 25 MG 24 hr tablet Take 1 tablet by mouth daily. 09/21/17  Yes [provider]  montelukast (SINGULAIR) 10 MG tablet Take 10 mg by mouth daily as needed (for allergies.).    Yes [provider]  Multiple Vitamins-Minerals (CENTRUM SILVER 50+WOMEN PO) Take by mouth.   Yes [provider]  omeprazole (PRILOSEC) 40 MG capsule Take by mouth. 09/04/18  Yes [provider]  Potassium 99 MG TABS Take 99 mg by mouth daily with lunch.    Yes [provider]  potassium chloride (KLOR-CON) 10 MEQ tablet Take by mouth. 06/26/19  Yes [provider]  predniSONE (DELTASONE) 10 MG tablet Take 10 mg  by mouth daily. 11/18/20  Yes [provider]  pseudoephedrine (SUDAFED) 30 MG tablet Take 30 mg by mouth every 4 (four) hours as needed for congestion.   Yes [provider]  Simethicone 180 MG CAPS Take 1-2 capsules by mouth as needed.   Yes [provider]  triamcinolone (NASACORT) 55 MCG/ACT AERO nasal inhaler Place 2 sprays into the nose daily.   Yes [provider]  Cyanocobalamin 1000 MCG/ML KIT Inject as directed every 30 (thirty) days.    [provider]  dicyclomine (BENTYL) 10 MG capsule Take by mouth 3 (three) times daily as needed. 11/14/19 11/13/20  [provider]  fluticasone furoate-vilanterol (BREO ELLIPTA) 100-25 MCG/INH AEPB Inhale into the lungs. Patient not taking: No sig reported 06/25/19   [provider]  meloxicam (MOBIC) 7.5 MG tablet Take 7.5-15 mg by mouth See admin instructions. Take 1 tablet (7.5 mg) twice daily or 2 tablets (15 mg) in the evening. Patient not taking: Reported on 11/19/2020 05/11/16   [provider]  naproxen sodium (ALEVE) 220 MG tablet Take 220 mg by mouth 2 (two) times daily as needed (for pain.). Patient not taking: Reported on 11/19/2020    [provider]   prednisoLONE acetate (PRED FORTE) 1 % ophthalmic suspension 1 drop 4 (four) times daily. Patient not taking: Reported on 11/19/2020    [provider]  Spacer/Aero-Holding Chambers (E-Z SPACER) inhaler Use as instructed 02/19/13   Juanito Doom, MD    Allergies Lactose, Other, Biaxin [clarithromycin], Hydrocodone-acetaminophen, Oxycodone, Sulfa antibiotics, and Sulfasalazine  Family History  Problem Relation Age of Onset   Asthma Mother    Breast cancer Mother 5   Emphysema Father        smoked   Heart disease Father    Lung cancer Father        smoked    Social History Social History   Tobacco Use   Smoking status: Never   Smokeless tobacco: Never  Vaping Use   Vaping Use: Never used  Substance Use Topics   Alcohol use: No   Drug use: No    Review of Systems  Constitutional: No fever/chills Eyes: No visual changes. ENT: No sore throat. Cardiovascular:   Respiratory:  shortness of breath. Gastrointestinal: No abdominal pain.  No nausea, no vomiting.  No diarrhea.  No constipation. Genitourinary: Negative for dysuria. Musculoskeletal: Negative for back pain. Skin: Negative for rash. Neurological: Negative for headaches, focal weakness or numbness  ____________________________________________   PHYSICAL EXAM:  VITAL SIGNS: ED Triage Vitals  Enc Vitals Group     BP 11/19/20 1753 (!) 137/106     Pulse Rate 11/19/20 1753 (!) 141     Resp 11/19/20 1753 16     Temp --      Temp src --      SpO2 11/19/20 1747 97 %     Weight 11/19/20 1759 190 lb (86.2 kg)     Height 11/19/20 1759 _0  (1.549 m)     Head Circumference --      Peak Flow --      Pain Score 11/19/20 1754 5     Pain Loc --      Pain Edu? --      Excl. in [place]? --     Constitutional: Alert and oriented. Well appearing and in no acute distress patient reports she does not feel well however. Eyes: Conjunctivae are normal. Head: Atraumatic. Nose: No  congestion/rhinnorhea. Mouth/Throat: Mucous membranes are moist.  Oropharynx non-erythematous. Neck: No stridor. Cardiovascular: Rapid ate, regular rhythm. Grossly normal heart sounds.  Good peripheral circulation. Respiratory: Normal respiratory effort.  No retractions. Lungs CTAB. Gastrointestinal: Soft and nontender. No distention. No abdominal bruits Musculoskeletal: No lower extremity tenderness slight trace edema.   Neurologic:  Normal speech and language. No gross focal neurologic deficits are appreciated.  Skin:  Skin is warm, dry and intact. No rash noted.   ____________________________________________   LABS (all labs ordered are listed, but only abnormal results are displayed)  Labs Reviewed  CBC WITH DIFFERENTIAL/PLATELET - Abnormal; Notable for the following components:      Result Value   RBC 3.64 (*)    Platelets 139 (*)    Lymphs Abs 0.5 (*)    All other components within normal limits  COMPREHENSIVE METABOLIC PANEL - Abnormal; Notable for the following components:   Glucose, Bld 114 (*)    Creatinine, Ser 1.21 (*)    Calcium 8.8 (*)    GFR, Estimated 47 (*)    All other components within normal limits  RESP PANEL BY RT-PCR (FLU A&B, COVID) ARPGX2  APTT  PROTIME-INR  CBC  TROPONIN I (HIGH SENSITIVITY)  TROPONIN I (HIGH SENSITIVITY)  TROPONIN I (HIGH SENSITIVITY)  TROPONIN I (HIGH SENSITIVITY)   ____________________________________________  EKG EKG read interpreted by me shows sinus tachycardia at a rate of 137 nonspecific ST-T wave changes likely rate related. _______________________________________  RADIOLOGY Gertha Calkin, personally viewed and evaluated these images (plain radiographs) as part of my medical decision making, as well as reviewing the written report by the radiologist.  ED MD interpretation: Chest CT read by radiology reviewed by me shows bilateral pulmonary emboli  Official radiology report(s): CT Angio Chest Pulmonary Embolism  (PE) W or WO Contrast  Result Date: 11/19/2020 CLINICAL DATA:  No known injury, shortness of breath EXAM: CT ANGIOGRAPHY CHEST WITH CONTRAST TECHNIQUE: Multidetector CT imaging of the chest was performed using the standard protocol during bolus administration of intravenous contrast. Multiplanar CT image reconstructions and MIPs were obtained to evaluate the vascular anatomy. CONTRAST:  33m OMNIPAQUE IOHEXOL 350 MG/ML SOLN COMPARISON:  None. FINDINGS: Cardiovascular: Satisfactory opacification of the pulmonary arteries to the segmental level. Small segmental pulmonary embolus in the left lower lobe and subsegmental branch of the right upper lobe. Main pulmonary artery is mildly dilated measuring 3.3 cm in diameter as can be seen with pulmonary arterial hypertension. Normal heart size. No pericardial effusion. Thoracic aortic atherosclerosis. Multi vessel coronary artery atherosclerosis. Mediastinum/Nodes: No enlarged mediastinal, hilar, or axillary lymph nodes. Thyroid gland, trachea, and esophagus demonstrate no significant findings. Lungs/Pleura: Lungs are clear. No pleural effusion or pneumothorax. Upper Abdomen: No acute abnormality. Musculoskeletal: No acute osseous abnormality. No aggressive osseous lesion. Review of the MIP images confirms the above findings. IMPRESSION: 1. Small segmental pulmonary embolus in the left lower lobe and subsegmental branch of the right upper lobe. Critical Value/emergent results were called by telephone at the time of interpretation on 11/19/2020 at 12:26 pm to provider FUAD ALESKEROV , who verbally acknowledged these results. Electronically Signed   By: HKathreen DevoidM.D.   On: 11/19/2020 12:37    ____________________________________________   PROCEDURES  Procedure(s) performed (including Critical Care):  Procedures   ____________________________________________   INITIAL IMPRESSION / ASSESSMENT AND PLAN / ED COURSE   Patient with bilateral pulmonary emboli  not on any blood thinners history of A. fib flutter.  She is currently in sinus tach at a rate of up to  140.  Will she normally takes metoprolol but ran out I will try a little bit of metoprolol and see if I get her heart rate down and see if this improves her chest tightness.  There is no sign of MI on the EKG.  I will call the hospitalist shortly and discuss treatment for the PEs.   ----------------------------------------- 10:55 PM on 11/19/2020 ----------------------------------------- Hospitalist had come and assume management tried some more beta-blockers and then it looked low so the patient went into SVT 1 dose of adenosine briefly brought her heart rate down but it went back up again hospitalist is managing this problem.  It may be due to either to her past history or the pulmonary emboli.  Patient otherwise appears to be stable she is maintaining good blood pressure good sats.       ____________________________________________   FINAL CLINICAL IMPRESSION(S) / ED DIAGNOSES  Final diagnoses:  Acute pulmonary embolism, unspecified pulmonary embolism type, unspecified whether acute cor pulmonale present ([place])  Sinus tachycardia     ED Discharge Orders     None        Note:  This document was prepared using Dragon voice recognition software and may include unintentional dictation errors.    Nena Polio, MD 11/19/20 2256

## 2020-11-19 NOTE — ED Notes (Signed)
Dr Imogene Burn at bedside. Pt daughter and son at bedside.

## 2020-11-19 NOTE — Assessment & Plan Note (Signed)
Admit to stepdown unit.  Telemetry.  Patient's ready been given 5 mg of IV Lopressor x2.  If her heart rate remains elevated after getting IV potassium and IV magnesium, will give the other 5 mg of IV Lopressor.  If her heart rate still has not slowed down, could try another 6 mg of IV adenosine while in the ICU.  We had difficulty getting her rhythm strip captured while in the ER.  This shows atrial flutter, she may benefit from IV Cardizem drip.  If she does have atrial flutter, patient will need cardiology consultation in the morning.  We will check echocardiogram.

## 2020-11-19 NOTE — Progress Notes (Signed)
   There appears to be atrial flutter waves in lead II.  Will start cardizem gtts.  Will need cardiology consult in AM. May need TEE and cardioversion.

## 2020-11-19 NOTE — Assessment & Plan Note (Signed)
We will check lower extremity ultrasounds.  Patient started on Eliquis.  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

## 2020-11-19 NOTE — Assessment & Plan Note (Signed)
Patient on Plaquenil and Arava

## 2020-11-19 NOTE — H&P (Signed)
History and Physical    MARGEART ALLENDER IRW:[id number] DOB: 18-May-1945 DOA: 11/19/2020  PCP: Marinda Elk, MD   Patient coming from: Home  I have personally briefly reviewed patient's old medical records in [place]  CC: pulmonary embolism HPI: 75 year old female with a history of rheumatoid arthritis, hyperlipidemia, asthma presents to the ER today from home.  She was seen yesterday by her pulmonologist.  She has been having increasing shortness of breath.  D-dimer was positive as an outpatient.  Patient underwent CTPA today which demonstrated bilateral subsegmental PE.  Sent to the ER for evaluation.  Patient states that she has been having more difficulty breathing of the last week.  Her inhalers have not been working.  Patient does have a history of atrial flutter.  She is not on any anticoagulation.  This is on EKG from May 2019.  Patient is followed by [facility] cardiology.  Not clear if her EKG from May 2019 was reviewed by cardiology.  Patient has not taken any anticoagulants for atrial flutter.  She has a prior history of DVT in her left leg that she says was over 30 years ago.  Patient states that intermittently over the last several months, she has noticed that she has left arm pain radiating to her left shoulder blade and back.  She is not sure if she has felt any palpitations.  In the ER, patient noted to be tachycardic with a heart rate 141.  This continued despite 5 mg of IV Lopressor.  When the patient's heart rate was in the high 130s and 140s, the patient was complaining of left arm pain and back pain.  She states this is the same pain she has had at home.  She wonders if her arm pain and back pain is related to her fast heart rate.  6 mg of IV adenosine were given which did slow her down to approximately 60 bpm.  This did show normal sinus rhythm and then the patient went back to a heart rate of approximately 135 beats a minute.  Due the patient's SVT and  acute PE, patient be admitted to stepdown ICU.  Patient is currently getting 20 mEq of IV potassium and 2 g of IV magnesium.   ED Course: noted to be tachycardic. Given Iv lopressor 5 mg. No change in HR.  6 mg IV adenosine given. EKG leads fell off while IV adenosine given. Only partial EKG obtained.  Review of Systems:  Review of Systems  Constitutional: Negative.   HENT: Negative.    Respiratory:  Positive for cough and shortness of breath. Negative for wheezing.   Cardiovascular:  Positive for chest pain.       With radiation to left arm and back. Only happens intermittently. No associated with SOB or diaphoresis. Resolves spontaneously.  Gastrointestinal: Negative.   Musculoskeletal:  Positive for back pain.  Skin: Negative.   Neurological: Negative.   Endo/Heme/Allergies: Negative.   Psychiatric/Behavioral: Negative.    All other systems reviewed and are negative.  Past Medical History:  Diagnosis Date   Allergic rhinitis    Anemia    Anxiety    Arthritis    Asthma    Atrophic vaginitis    B12 deficiency    Bronchitis, chronic (HCC)    Cervical disc disease    Collagen vascular disease ([place])    RA   Depression    Fibrocystic breast disease    Foot ulcer (HCC)    GERD (gastroesophageal reflux disease)  Hammer toe    Hyperlipidemia    IBS (irritable bowel syndrome)    Obesity    Obstructive sleep apnea    OSA (obstructive sleep apnea)    Osteoarthrosis    Multi site   Osteopenia    Rheumatoid arthritis ([place])    Shingles    Thyroid nodule    MULTIPLE   Tonsillitis    recurrent   Umbilical hernia     Past Surgical History:  Procedure Laterality Date   BACK SURGERY  2011   LAMINECTOMY   BREAST BIOPSY Right    neg   CERVICAL FUSION  2011, 2012   X 2   COLONOSCOPY  2005, 2015   COLONOSCOPY WITH PROPOFOL N/A 08/04/2020   Procedure: COLONOSCOPY WITH PROPOFOL;  Surgeon: Robert Bellow, MD;  Location: [facility] ENDOSCOPY;  Service: Endoscopy;  Laterality:  N/A;   ESOPHAGOGASTRODUODENOSCOPY ENDOSCOPY  2005, 2013, 2015   EYE SURGERY Bilateral    Cataract Extraction with IOL    HERNIA REPAIR     JOINT REPLACEMENT     KNEE ARTHROPLASTY Left 07/12/2016   Procedure: COMPUTER ASSISTED TOTAL KNEE ARTHROPLASTY;  Surgeon: Dereck Leep, MD;  Location: [facility] ORS;  Service: Orthopedics;  Laterality: Left;   Laryngeal tear after intubation w/repair     LUMBAR DISC SURGERY  12/2009   REPLACEMENT TOTAL KNEE Right 06/2007   DR. HOOTEN, [facility]   REPLACEMENT UNICONDYLAR JOINT KNEE     DR. HOOTEN, [facility]   SHOULDER ARTHROSCOPY WITH ROTATOR CUFF REPAIR Right    TONSILLECTOMY     TOTAL HIP ARTHROPLASTY Right 01/31/2017   Procedure: TOTAL HIP ARTHROPLASTY;  Surgeon: Dereck Leep, MD;  Location: [facility] ORS;  Service: Orthopedics;  Laterality: Right;   TUBAL LIGATION     UPPER GI ENDOSCOPY       reports that she has never smoked. She has never used smokeless tobacco. She reports that she does not drink alcohol and does not use drugs.  Allergies  Allergen Reactions   Lactose Other (See Comments) and Diarrhea   Other Other (See Comments)    Pt ONLY tolerates SLOW-FE (slow released iron)---upsets IBS   Biaxin [Clarithromycin] Other (See Comments)    GI upset    Hydrocodone-Acetaminophen Itching    Tolerates acetaminophen    Oxycodone Itching   Sulfa Antibiotics Other (See Comments) and Diarrhea    GI upset  GI upset GI upset    Sulfasalazine Diarrhea    Family History  Problem Relation Age of Onset   Asthma Mother    Breast cancer Mother 37   Emphysema Father        smoked   Heart disease Father    Lung cancer Father        smoked    Prior to Admission medications   Medication Sig Start Date End Date Taking? Authorizing Provider  acetaminophen (TYLENOL) 500 MG tablet Take 1,000 mg by mouth 3 (three) times daily as needed for mild pain.    Yes [provider]  albuterol (VENTOLIN HFA) 108 (90 Base) MCG/ACT inhaler SMARTSIG:2  Inhalation Via Inhaler Every 6 Hours PRN 06/17/20  Yes [provider]  ALPRAZolam Duanne Moron) 0.5 MG tablet Take by mouth daily as needed. 07/07/20  Yes [provider]  budesonide (PULMICORT) 0.25 MG/2ML nebulizer solution Inhale into the lungs. 04/01/20 04/01/21 Yes [provider]  budesonide-formoterol (SYMBICORT) 160-4.5 MCG/ACT inhaler Inhale 2 puffs into the lungs 2 (two) times daily as needed.   Yes [provider]  Cholecalciferol (VITAMIN D) 2000 units tablet Take 4,000 Units by mouth daily.    Yes [provider]  citalopram (CELEXA) 20 MG tablet Take 20 mg by mouth every evening.  06/15/16  Yes [provider]  estradiol (ESTRACE) 0.1 MG/GM vaginal cream Insert pea size amount vaginally nightly x 2 weeks, then every other night x 2 weeks, then 2-3 times weekly for maintenance 06/18/18  Yes [provider]  famotidine (PEPCID) 20 MG tablet Take by mouth.   Yes [provider]  ferrous sulfate 325 (65 FE) MG tablet Take 1 tablet by mouth daily with breakfast. 01/01/20 12/31/20 Yes [provider]  fluconazole (DIFLUCAN) 150 MG tablet Take 150 mg by mouth once. 03/19/20  Yes [provider]  hydroxychloroquine (PLAQUENIL) 200 MG tablet Take 1 tablet (200 mg total) by mouth daily. Patient taking differently: Take 200 mg by mouth daily with lunch. 07/21/16  Yes Watt Climes, PA  ibuprofen (ADVIL,MOTRIN) 200 MG tablet Take 400 mg by mouth every 6 (six) hours as needed (for pain.).   Yes [provider]  leflunomide (ARAVA) 20 MG tablet Take by mouth. 06/30/20  Yes [provider]  levalbuterol Penne Lash) 1.25 MG/3ML nebulizer solution Inhale into the lungs. 04/01/20 04/01/21 Yes [provider]  meclizine (ANTIVERT) 12.5 MG tablet PLEASE SEE ATTACHED FOR DETAILED DIRECTIONS 10/05/18  Yes [provider]  metoprolol succinate (TOPROL-XL) 25 MG 24 hr tablet Take 1 tablet by mouth daily.  09/21/17  Yes [provider]  montelukast (SINGULAIR) 10 MG tablet Take 10 mg by mouth daily as needed (for allergies.).    Yes [provider]  Multiple Vitamins-Minerals (CENTRUM SILVER 50+WOMEN PO) Take by mouth.   Yes [provider]  omeprazole (PRILOSEC) 40 MG capsule Take by mouth. 09/04/18  Yes [provider]  Potassium 99 MG TABS Take 99 mg by mouth daily with lunch.    Yes [provider]  potassium chloride (KLOR-CON) 10 MEQ tablet Take by mouth. 06/26/19  Yes [provider]  predniSONE (DELTASONE) 10 MG tablet Take 10 mg by mouth daily. 11/18/20  Yes [provider]  pseudoephedrine (SUDAFED) 30 MG tablet Take 30 mg by mouth every 4 (four) hours as needed for congestion.   Yes [provider]  Simethicone 180 MG CAPS Take 1-2 capsules by mouth as needed.   Yes [provider]  triamcinolone (NASACORT) 55 MCG/ACT AERO nasal inhaler Place 2 sprays into the nose daily.   Yes [provider]  Cyanocobalamin 1000 MCG/ML KIT Inject as directed every 30 (thirty) days.    [provider]  dicyclomine (BENTYL) 10 MG capsule Take by mouth 3 (three) times daily as needed. 11/14/19 11/13/20  [provider]  fluticasone furoate-vilanterol (BREO ELLIPTA) 100-25 MCG/INH AEPB Inhale into the lungs. Patient not taking: No sig reported 06/25/19   [provider]  meloxicam (MOBIC) 7.5 MG tablet Take 7.5-15 mg by mouth See admin instructions. Take 1 tablet (7.5 mg) twice daily or 2 tablets (15 mg) in the evening. Patient not taking: Reported on 11/19/2020 05/11/16   [provider]  naproxen sodium (ALEVE) 220 MG tablet Take 220 mg by mouth 2 (two) times daily as needed (for pain.). Patient not taking: Reported on 11/19/2020    [provider]  prednisoLONE acetate (PRED FORTE) 1 % ophthalmic suspension 1 drop 4 (four) times daily. Patient not taking: Reported on 11/19/2020     [provider]  Spacer/Aero-Holding Chambers (E-Z SPACER)  inhaler Use as instructed 02/19/13   Juanito Doom, MD    Physical Exam: Vitals:   11/19/20 1810 11/19/20 1812 11/19/20 1815 11/19/20 1820  BP:      Pulse: (!) 138  (!) 135 (!) 136  Resp: (!) 24  (!) 21 (!) 21  Temp:  98.6 F (37 C)    TempSrc:  Oral    SpO2: 97%  97% 97%  Weight:      Height:        Physical Exam Vitals and nursing note reviewed.  Constitutional:      General: She is not in acute distress.    Appearance: Normal appearance. She is normal weight. She is not ill-appearing, toxic-appearing or diaphoretic.  HENT:     Head: Normocephalic and atraumatic.     Nose: Nose normal. No rhinorrhea.  Eyes:     General: No scleral icterus.       Right eye: No discharge.        Left eye: No discharge.  Cardiovascular:     Rate and Rhythm: Regular rhythm. Tachycardia present.     Pulses: Normal pulses.     Heart sounds: No murmur heard. Pulmonary:     Effort: Pulmonary effort is normal. No respiratory distress.     Breath sounds: Normal breath sounds. No wheezing or rales.  Abdominal:     General: Bowel sounds are normal. There is no distension.     Palpations: Abdomen is soft.     Tenderness: There is no abdominal tenderness. There is no guarding or rebound.  Musculoskeletal:     Right lower leg: No edema.     Left lower leg: No edema.  Skin:    General: Skin is warm and dry.     Capillary Refill: Capillary refill takes less than 2 seconds.  Neurological:     General: No focal deficit present.     Mental Status: She is alert and oriented to person, place, and time.     Labs on Admission: I have personally reviewed following labs and imaging studies  CBC: Recent Labs  Lab 11/19/20 1802  WBC 6.5  NEUTROABS 5.6  HGB 12.0  HCT 36.2  MCV 99.5  PLT 557*   Basic Metabolic Panel: Recent Labs  Lab 11/19/20 1208 11/19/20 1802  NA  --  141  K  --  3.7  CL  --  107  CO2  --  25   GLUCOSE  --  114*  BUN  --  13  CREATININE 1.10* 1.21*  CALCIUM  --  8.8*   GFR: Estimated Creatinine Clearance: 41.3 mL/min (A) (by C-G formula based on SCr of 1.21 mg/dL (H)). Liver Function Tests: Recent Labs  Lab 11/19/20 1802  AST 23  ALT 21  ALKPHOS 84  BILITOT 0.8  PROT 6.8  ALBUMIN 3.6   No results for input(s): LIPASE, AMYLASE in the last 168 hours. No results for input(s): AMMONIA in the last 168 hours. Coagulation Profile: Recent Labs  Lab 11/19/20 1802  INR 1.0   Cardiac Enzymes: No results for input(s): CKTOTAL, CKMB, CKMBINDEX, TROPONINI in the last 168 hours. BNP (last 3 results) No results for input(s): PROBNP in the last 8760 hours. HbA1C: No results for input(s): HGBA1C in the last 72 hours. CBG: No results for input(s): GLUCAP in the last 168 hours. Lipid Profile: No results for input(s): CHOL, HDL, LDLCALC, TRIG, CHOLHDL, LDLDIRECT in the last 72 hours. Thyroid Function Tests: No results for input(s): TSH,  T4TOTAL, FREET4, T3FREE, THYROIDAB in the last 72 hours. Anemia Panel: No results for input(s): VITAMINB12, FOLATE, FERRITIN, TIBC, IRON, RETICCTPCT in the last 72 hours. Urine analysis:    Component Value Date/Time   COLORURINE YELLOW 01/25/2017 [place] 01/25/2017 1219   LABSPEC 1.015 01/25/2017 1219   PHURINE 5.5 01/25/2017 [place] 01/25/2017 1219   HGBUR NEGATIVE 01/25/2017 1219   [place] 01/25/2017 1219   KETONESUR NEGATIVE 01/25/2017 1219   PROTEINUR NEGATIVE 01/25/2017 1219   NITRITE NEGATIVE 01/25/2017 1219   LEUKOCYTESUR SMALL (A) 01/25/2017 1219    Radiological Exams on Admission: I have personally reviewed images CT Angio Chest Pulmonary Embolism (PE) W or WO Contrast  Result Date: 11/19/2020 CLINICAL DATA:  No known injury, shortness of breath EXAM: CT ANGIOGRAPHY CHEST WITH CONTRAST TECHNIQUE: Multidetector CT imaging of the chest was performed using the standard protocol  during bolus administration of intravenous contrast. Multiplanar CT image reconstructions and MIPs were obtained to evaluate the vascular anatomy. CONTRAST:  20m OMNIPAQUE IOHEXOL 350 MG/ML SOLN COMPARISON:  None. FINDINGS: Cardiovascular: Satisfactory opacification of the pulmonary arteries to the segmental level. Small segmental pulmonary embolus in the left lower lobe and subsegmental branch of the right upper lobe. Main pulmonary artery is mildly dilated measuring 3.3 cm in diameter as can be seen with pulmonary arterial hypertension. Normal heart size. No pericardial effusion. Thoracic aortic atherosclerosis. Multi vessel coronary artery atherosclerosis. Mediastinum/Nodes: No enlarged mediastinal, hilar, or axillary lymph nodes. Thyroid gland, trachea, and esophagus demonstrate no significant findings. Lungs/Pleura: Lungs are clear. No pleural effusion or pneumothorax. Upper Abdomen: No acute abnormality. Musculoskeletal: No acute osseous abnormality. No aggressive osseous lesion. Review of the MIP images confirms the above findings. IMPRESSION: 1. Small segmental pulmonary embolus in the left lower lobe and subsegmental branch of the right upper lobe. Critical Value/emergent results were called by telephone at the time of interpretation on 11/19/2020 at 12:26 pm to provider FUAD ALESKEROV , who verbally acknowledged these results. Electronically Signed   By: HKathreen DevoidM.D.   On: 11/19/2020 12:37    EKG: I have personally reviewed EKG:    Assessment/Plan Principal Problem:   SVT (supraventricular tachycardia) (HCC) Active Problems:   Acute pulmonary embolism (HCC)   History of atrial flutter   [place]oid [place]   Hyperlipidemia    [place]ricular tachycardia) ([place] Admit to stepdown unit.  Telemetry.  Patient's ready been given 5 mg of IV Lopressor x2.  If her heart rate remains elevated after getting IV potassium and IV magnesium, will give the other 5 mg of IV Lopressor.  If  her heart rate still has not slowed down, could try another 6 mg of IV adenosine while in the ICU.  We had difficulty getting her rhythm strip captured while in the ER.  This shows atrial flutter, she may benefit from IV Cardizem drip.  If she does have atrial flutter, patient will need cardiology consultation in the morning.  We will check echocardiogram.  Acute pulmonary embolism ([place] We will check lower extremity ultrasounds.  Patient started on Eliquis.  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  History of atrial flutter Patient with a prior history of atrial flutter.  This was evident on her EKG from May 2019.  [place] on Plaquenil and Arava  Hyperlipidemia Stable  DVT prophylaxis: Eliquis Code Status: Full Code Family Communication: discussed with  pt's dtr carrie at bedside  Disposition Plan: return home  Consults called: none  Admission status: Inpatient, Step Down Unit   Kristopher Oppenheim, DO [facility] 11/19/2020, 7:23 PM

## 2020-11-19 NOTE — ED Notes (Signed)
Repeat EKG obtained. Verbal order for 2.5mg  metoprolol IV again.

## 2020-11-19 NOTE — ED Notes (Signed)
Fluids added to potassium infusion and rate changed to 70 due to pt complaining of burning in her IV.

## 2020-11-20 ENCOUNTER — Inpatient Hospital Stay
Admit: 2020-11-20 | Discharge: 2020-11-20 | Disposition: A | Discharge status: Home / Self Care | Payer: Medicare Other | Attending: Internal Medicine | Admitting: Internal Medicine

## 2020-11-20 DIAGNOSIS — I2699 Other pulmonary embolism without acute cor pulmonale: Secondary | ICD-10-CM | POA: Diagnosis present

## 2020-11-20 LAB — CBC WITH DIFFERENTIAL/PLATELET
Abs Immature Granulocytes: 0.01 10*3/uL (ref 0.00–0.07)
Basophils Absolute: 0 10*3/uL (ref 0.0–0.1)
Basophils Relative: 1 %
Eosinophils Absolute: 0.1 10*3/uL (ref 0.0–0.5)
Eosinophils Relative: 3 %
HCT: 32.5 % — ABNORMAL LOW (ref 36.0–46.0)
Hemoglobin: 10.1 g/dL — ABNORMAL LOW (ref 12.0–15.0)
Immature Granulocytes: 0 %
Lymphocytes Relative: 34 %
Lymphs Abs: 1.8 10*3/uL (ref 0.7–4.0)
MCH: 31.2 pg (ref 26.0–34.0)
MCHC: 31.1 g/dL (ref 30.0–36.0)
MCV: 100.3 fL — ABNORMAL HIGH (ref 80.0–100.0)
Monocytes Absolute: 0.5 10*3/uL (ref 0.1–1.0)
Monocytes Relative: 9 %
Neutro Abs: 2.9 10*3/uL (ref 1.7–7.7)
Neutrophils Relative %: 53 %
Platelets: 123 10*3/uL — ABNORMAL LOW (ref 150–400)
RBC: 3.24 MIL/uL — ABNORMAL LOW (ref 3.87–5.11)
RDW: 14.1 % (ref 11.5–15.5)
WBC: 5.3 10*3/uL (ref 4.0–10.5)
nRBC: 0 % (ref 0.0–0.2)

## 2020-11-20 LAB — COMPREHENSIVE METABOLIC PANEL
ALT: 18 U/L (ref 0–44)
AST: 22 U/L (ref 15–41)
Albumin: 3.2 g/dL — ABNORMAL LOW (ref 3.5–5.0)
Alkaline Phosphatase: 63 U/L (ref 38–126)
Anion gap: 6 (ref 5–15)
BUN: 13 mg/dL (ref 8–23)
CO2: 26 mmol/L (ref 22–32)
Calcium: 8.3 mg/dL — ABNORMAL LOW (ref 8.9–10.3)
Chloride: 109 mmol/L (ref 98–111)
Creatinine, Ser: 1.03 mg/dL — ABNORMAL HIGH (ref 0.44–1.00)
GFR, Estimated: 57 mL/min — ABNORMAL LOW (ref >60)
Glucose, Bld: 94 mg/dL (ref 70–99)
Potassium: 3.5 mmol/L (ref 3.5–5.1)
Sodium: 141 mmol/L (ref 135–145)
Total Bilirubin: 0.8 mg/dL (ref 0.3–1.2)
Total Protein: 5.5 g/dL — ABNORMAL LOW (ref 6.5–8.1)

## 2020-11-20 LAB — ECHOCARDIOGRAM COMPLETE
AR max vel: 1.73 cm2
AV Peak grad: 5.3 mmHg
Ao pk vel: 1.15 m/s
Area-P 1/2: 4.46 cm2
Height: 61 in
S' Lateral: 3.68 cm
Single Plane A4C EF: 56.1 %
Weight: 3040 oz

## 2020-11-20 LAB — MAGNESIUM: Magnesium: 2.3 mg/dL (ref 1.7–2.4)

## 2020-11-20 MED ORDER — ZOLPIDEM TARTRATE 5 MG PO TABS: 5.0000 mg | ORAL_TABLET | Freq: Every evening | ORAL | Status: DC | PRN

## 2020-11-20 MED ORDER — FERROUS SULFATE 325 (65 FE) MG PO TABS
325.0000 mg | ORAL_TABLET | Freq: Every day | ORAL | Status: DC
  Filled 2020-11-20 (×2): qty 1

## 2020-11-20 MED ORDER — ALPRAZOLAM 0.5 MG PO TABS
0.2500 mg | ORAL_TABLET | Freq: Once | ORAL | Status: AC
  Administered 2020-11-20: 0.25 mg via ORAL
  Filled 2020-11-20: qty 1

## 2020-11-20 MED ORDER — BUDESONIDE 0.25 MG/2ML IN SUSP
0.2500 mg | Freq: Every day | RESPIRATORY_TRACT | Status: DC
  Administered 2020-11-20 – 2020-11-22 (×3): 0.25 mg via RESPIRATORY_TRACT
  Filled 2020-11-20 (×3): qty 2

## 2020-11-20 MED ORDER — FAMOTIDINE 20 MG PO TABS
10.0000 mg | ORAL_TABLET | Freq: Every day | ORAL | Status: DC
  Administered 2020-11-20 – 2020-11-22 (×3): 10 mg via ORAL
  Filled 2020-11-20 (×3): qty 1

## 2020-11-20 MED ORDER — LEFLUNOMIDE 20 MG PO TABS
20.0000 mg | ORAL_TABLET | Freq: Every day | ORAL | Status: DC
  Administered 2020-11-20 – 2020-11-21 (×2): 20 mg via ORAL
  Filled 2020-11-20 (×3): qty 1

## 2020-11-20 MED ORDER — VITAMIN D3 25 MCG (1000 UNIT) PO TABS
4000.0000 [IU] | ORAL_TABLET | Freq: Every day | ORAL | Status: DC
Start: 2020-11-21 — End: 2020-11-22
  Administered 2020-11-21 – 2020-11-22 (×2): 4000 [IU] via ORAL
  Filled 2020-11-20 (×4): qty 4

## 2020-11-20 MED ORDER — SIMETHICONE 80 MG PO CHEW
80.0000 mg | CHEWABLE_TABLET | Freq: Four times a day (QID) | ORAL | Status: DC | PRN
  Filled 2020-11-20: qty 1

## 2020-11-20 MED ORDER — METOPROLOL TARTRATE 25 MG PO TABS
25.0000 mg | ORAL_TABLET | Freq: Two times a day (BID) | ORAL | Status: DC
  Administered 2020-11-20 – 2020-11-22 (×5): 25 mg via ORAL
  Filled 2020-11-20 (×5): qty 1

## 2020-11-20 NOTE — ED Notes (Signed)
Patient reports she is starting to feel anxious.  States "I just found out recently that I have clots and it is worrying me."  Reassurance provided to patient.  Patient requesting medication to help with anxiety and states she would like something to help her sleep.  Patient medicated per PRN orders

## 2020-11-20 NOTE — ED Notes (Signed)
First contact: Assumed care of patient. Pt noted to be ambulating in room to bathroom with daughter. Pt able to void sans complications.   Pt placed back on monitor, VS updated. Given personal care items: Tooth brush, rags with hot water basin & no rinse wash, deodorant. Pt requests to perform her own self care.  Call light in reach, linens changed.  Denies further needs at this time.

## 2020-11-20 NOTE — Progress Notes (Signed)
[facility]  - [place] at [facility]   PATIENT NAME: Courtney Wilcox    MR#:  [id number]  DATE OF BIRTH:  1945/10/02  SUBJECTIVE:   patient was sent in from pulmonary office after her CT scan of the chest as outpatient showed PE. Patient was followed up for ongoing cough and shortness of breath. She follows with [facility]  patient was also found to be in tachyarrhythmia. Initially thought it was SBT received adenosine. Then she went into flutter failure and was placed on IV diltiazem drip. REVIEW OF SYSTEMS:   Review of Systems  Constitutional:  Negative for chills, fever and weight loss.  HENT:  Negative for ear discharge, ear pain and nosebleeds.   Eyes:  Negative for blurred vision, pain and discharge.  Respiratory:  Positive for cough and shortness of breath. Negative for sputum production, wheezing and stridor.   Cardiovascular:  Positive for palpitations. Negative for chest pain, orthopnea and PND.  Gastrointestinal:  Negative for abdominal pain, diarrhea, nausea and vomiting.  Genitourinary:  Negative for frequency and urgency.  Musculoskeletal:  Negative for back pain and joint pain.  Neurological:  Negative for sensory change, speech change, focal weakness and weakness.  Psychiatric/Behavioral:  Negative for depression and hallucinations. The patient is not nervous/anxious.   Tolerating Diet: Tolerating PT:   DRUG ALLERGIES:   Allergies  Allergen Reactions   Lactose Other (See Comments) and Diarrhea   Other Other (See Comments)    Pt ONLY tolerates SLOW-FE (slow released iron)---upsets IBS   Biaxin [Clarithromycin] Other (See Comments)    GI upset    Hydrocodone-Acetaminophen Itching    Tolerates acetaminophen    Oxycodone Itching   Sulfa Antibiotics Other (See Comments) and Diarrhea    GI upset  GI upset GI upset    Sulfasalazine Diarrhea    VITALS:  Blood pressure 133/89, pulse 71, temperature 98.6 F (37 C),  temperature source Oral, resp. rate (!) 22, height 5\' 1"  (1.549 m), weight 86.2 kg, SpO2 94 %.  PHYSICAL EXAMINATION:   Physical Exam  GENERAL:  75 y.o.-year-old patient lying in the bed with no acute distress.  LUNGS: Normal breath sounds bilaterally, no wheezing, rales, rhonchi. No use of accessory muscles of respiration.  CARDIOVASCULAR: S1, S2 normal. No murmurs, rubs, or gallops. Tachy-brady ABDOMEN: Soft, nontender, nondistended. Bowel sounds present. No organomegaly or mass.  EXTREMITIES: No cyanosis, clubbing or edema b/l.    NEUROLOGIC: Cranial nerves II through XII are intact. No focal Motor or sensory deficits b/l.   PSYCHIATRIC:  patient is alert and oriented x 3.  SKIN: No obvious rash, lesion, or ulcer.   LABORATORY PANEL:  CBC Recent Labs  Lab 11/20/20 0656  WBC 5.3  HGB 10.1*  HCT 32.5*  PLT 123*    Chemistries  Recent Labs  Lab 11/20/20 0656  NA 141  K 3.5  CL 109  CO2 26  GLUCOSE 94  BUN 13  CREATININE 1.03*  CALCIUM 8.3*  MG 2.3  AST 22  ALT 18  ALKPHOS 63  BILITOT 0.8   Cardiac Enzymes No results for input(s): TROPONINI in the last 168 hours. RADIOLOGY:  CT Angio Chest Pulmonary Embolism (PE) W or WO Contrast  Result Date: 11/19/2020 CLINICAL DATA:  No known injury, shortness of breath EXAM: CT ANGIOGRAPHY CHEST WITH CONTRAST TECHNIQUE: Multidetector CT imaging of the chest was performed using the standard protocol during bolus administration of intravenous contrast. Multiplanar CT image reconstructions and MIPs were obtained to evaluate  the vascular anatomy. CONTRAST:  74mL OMNIPAQUE IOHEXOL 350 MG/ML SOLN COMPARISON:  None. FINDINGS: Cardiovascular: Satisfactory opacification of the pulmonary arteries to the segmental level. Small segmental pulmonary embolus in the left lower lobe and subsegmental branch of the right upper lobe. Main pulmonary artery is mildly dilated measuring 3.3 cm in diameter as can be seen with pulmonary arterial  hypertension. Normal heart size. No pericardial effusion. Thoracic aortic atherosclerosis. Multi vessel coronary artery atherosclerosis. Mediastinum/Nodes: No enlarged mediastinal, hilar, or axillary lymph nodes. Thyroid gland, trachea, and esophagus demonstrate no significant findings. Lungs/Pleura: Lungs are clear. No pleural effusion or pneumothorax. Upper Abdomen: No acute abnormality. Musculoskeletal: No acute osseous abnormality. No aggressive osseous lesion. Review of the MIP images confirms the above findings. IMPRESSION: 1. Small segmental pulmonary embolus in the left lower lobe and subsegmental branch of the right upper lobe. Critical Value/emergent results were called by telephone at the time of interpretation on 11/19/2020 at 12:26 pm to provider FUAD ALESKEROV , who verbally acknowledged these results. Electronically Signed   By: Kathreen Devoid M.D.   On: 11/19/2020 12:37   US Venous Img Lower Bilateral (DVT)  Result Date: 11/20/2020 CLINICAL DATA:  Known pulmonary embolism EXAM: BILATERAL LOWER EXTREMITY VENOUS DOPPLER ULTRASOUND TECHNIQUE: Gray-scale sonography with graded compression, as well as color Doppler and duplex ultrasound were performed to evaluate the lower extremity deep venous systems from the level of the common femoral vein and including the common femoral, femoral, profunda femoral, popliteal and calf veins including the posterior tibial, peroneal and gastrocnemius veins when visible. The superficial great saphenous vein was also interrogated. Spectral Doppler was utilized to evaluate flow at rest and with distal augmentation maneuvers in the common femoral, femoral and popliteal veins. COMPARISON:  None. FINDINGS: RIGHT LOWER EXTREMITY Common Femoral Vein: No evidence of thrombus. Normal compressibility, respiratory phasicity and response to augmentation. Saphenofemoral Junction: No evidence of thrombus. Normal compressibility and flow on color Doppler imaging. Profunda Femoral  Vein: No evidence of thrombus. Normal compressibility and flow on color Doppler imaging. Femoral Vein: No evidence of thrombus. Normal compressibility, respiratory phasicity and response to augmentation. Popliteal Vein: No evidence of thrombus. Normal compressibility, respiratory phasicity and response to augmentation. Calf Veins: No evidence of thrombus. Normal compressibility and flow on color Doppler imaging. Superficial Great Saphenous Vein: No evidence of thrombus. Normal compressibility. Venous Reflux:  None. Other Findings:  None. LEFT LOWER EXTREMITY Common Femoral Vein: No evidence of thrombus. Normal compressibility, respiratory phasicity and response to augmentation. Saphenofemoral Junction: No evidence of thrombus. Normal compressibility and flow on color Doppler imaging. Profunda Femoral Vein: No evidence of thrombus. Normal compressibility and flow on color Doppler imaging. Femoral Vein: No evidence of thrombus. Normal compressibility, respiratory phasicity and response to augmentation. Popliteal Vein: No evidence of thrombus. Normal compressibility, respiratory phasicity and response to augmentation. Calf Veins: No evidence of thrombus. Normal compressibility and flow on color Doppler imaging. Superficial Great Saphenous Vein: No evidence of thrombus. Normal compressibility. Venous Reflux:  None. Other Findings:  None. IMPRESSION: No evidence of deep venous thrombosis in either lower extremity. Electronically Signed   By: Inez Catalina M.D.   On: 11/20/2020 00:51   ECHOCARDIOGRAM COMPLETE  Result Date: 11/20/2020    ECHOCARDIOGRAM REPORT   Patient Name:   Courtney Wilcox Date of Exam: 11/20/2020 Medical Rec #:  [id number]         Height:       61.0 in Accession #:    [id number]  Weight:       190.0 lb Date of Birth:  05-18-1945         BSA:          1.848 m Patient Age:    15 years          BP:           130/84 mmHg Patient Gender: F                 HR:           63 bpm. Exam Location:  [facility]  Procedure: 2D Echo Indications:     Atrial Flutter I48.92  History:         Patient has no prior history of Echocardiogram examinations.  Sonographer:     Kathlen Brunswick RDCS Referring Phys:  [place] Diagnosing Phys: [place]  1. Left ventricular ejection fraction, by estimation, is 45 to 50%. The left ventricle has mildly decreased function. The left ventricle demonstrates global hypokinesis. The left ventricular internal cavity size was mildly to moderately dilated. There is mild concentric left ventricular hypertrophy. Left ventricular diastolic parameters are consistent with Grade I diastolic dysfunction (impaired relaxation).  2. Right ventricular systolic function is mildly reduced. The right ventricular size is normal.  3. Left atrial size was severely dilated.  4. Right atrial size was moderately dilated.  5. The mitral valve is grossly normal. Mild to moderate mitral valve regurgitation.  6. Tricuspid valve regurgitation is mild to moderate.  7. The aortic valve is calcified. Aortic valve regurgitation is not visualized. Mild aortic valve sclerosis is present, with no evidence of aortic valve stenosis. FINDINGS  Left Ventricle: Left ventricular ejection fraction, by estimation, is 45 to 50%. The left ventricle has mildly decreased function. The left ventricle demonstrates global hypokinesis. The left ventricular internal cavity size was mildly to moderately dilated. There is mild concentric left ventricular hypertrophy. Left ventricular diastolic parameters are consistent with Grade I diastolic dysfunction (impaired relaxation). Right Ventricle: The right ventricular size is normal. No increase in right ventricular wall thickness. Right ventricular systolic function is mildly reduced. Left Atrium: Left atrial size was severely dilated. Right Atrium: Right atrial size was moderately dilated. Pericardium: There is no evidence of pericardial effusion. Mitral Valve: The mitral valve is  grossly normal. Mild to moderate mitral valve regurgitation. Tricuspid Valve: The tricuspid valve is grossly normal. Tricuspid valve regurgitation is mild to moderate. Aortic Valve: The aortic valve is calcified. Aortic valve regurgitation is not visualized. Mild aortic valve sclerosis is present, with no evidence of aortic valve stenosis. Aortic valve peak gradient measures 5.3 mmHg. Pulmonic Valve: The pulmonic valve was normal in structure. Pulmonic valve regurgitation is trivial. Aorta: The aortic root, ascending aorta and aortic arch are all structurally normal, with no evidence of dilitation or obstruction. IAS/Shunts: The interatrial septum was not well visualized.  LEFT VENTRICLE PLAX 2D LVIDd:         4.94 cm     Diastology LVIDs:         3.68 cm     LV e' medial:   4.57 cm/s LV PW:         1.29 cm     LV E/e' medial: 20.6 LV IVS:        1.34 cm LVOT diam:     1.80 cm LV SV:         44 LV SV Index:   24 LVOT Area:     2.54  cm  LV Volumes (MOD) LV vol d, MOD A4C: 90.5 ml LV vol s, MOD A4C: 39.7 ml LV SV MOD A4C:     90.5 ml RIGHT VENTRICLE RV Basal diam:  2.79 cm RV S prime:     11.10 cm/s TAPSE (M-mode): 1.6 cm LEFT ATRIUM             Index        RIGHT ATRIUM           Index LA diam:        3.70 cm 2.00 cm/m   RA Area:     12.90 cm LA Vol (A2C):   52.6 ml 28.46 ml/m  RA Volume:   30.20 ml  16.34 ml/m LA Vol (A4C):   65.6 ml 35.50 ml/m LA Biplane Vol: 63.4 ml 34.31 ml/m  AORTIC VALVE                 PULMONIC VALVE AV Area (Vmax): 1.73 cm     PV Vmax:       0.65 m/s AV Vmax:        115.00 cm/s  PV Peak grad:  1.7 mmHg AV Peak Grad:   5.3 mmHg LVOT Vmax:      78.30 cm/s LVOT Vmean:     51.400 cm/s LVOT VTI:       0.172 m  AORTA Ao Root diam: 3.10 cm Ao Asc diam:  2.60 cm MITRAL VALVE MV Area (PHT): 4.46 cm    SHUNTS MV Decel Time: 170 msec    Systemic VTI:  0.17 m MV E velocity: 94.15 cm/s  Systemic Diam: 1.80 cm MV A velocity: 57.80 cm/s MV E/A ratio:  1.63 [employer] signed by  Neoma Laming Signature Date/Time: 11/20/2020/12:12:53 PM    Final    ASSESSMENT AND PLAN:  75 year old female with a history of rheumatoid arthritis, hyperlipidemia, asthma presents to the ER today from home.  She was seen yesterday by her pulmonologist.  She has been having increasing shortness of breath.  D-dimer was positive as an outpatient.  Patient underwent CTPA today which demonstrated bilateral subsegmental PE.  Sent to the ER for evaluation.   Patient states that she has been having more difficulty breathing of the last week.  Her inhalers have not been working.  Patient does have a history of atrial flutter.  She is not on any anticoagulation.  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 Risk and benefits of blood thinners discussed  Arrhythmia-- SVT versus flutter fib -- patient has history of atrial flutter/fibrillation per old records -- will resume metoprolol 25 BID -- try wean her off diltiazem drip -- currently on eliquis for PE should be adequate for anticoagulation -- cardiology consultation with Dr. Yancey Flemings (covering for [facility])  rheumatoid arthritis -- patient on plaquenil and Arava  history of asthma -- continue bronchodilators -- sats 99-100% on room air  Procedures: Family communication :none Consults :[facility] cards CODE STATUS: full DVT Prophylaxis :Eliquis Level of care: Progressive Cardiac Status is: Inpatient  Remains inpatient appropriate because: PE and arrhythmia        TOTAL TIME TAKING CARE OF THIS PATIENT: 30 minutes.  >50% time spent on counselling and coordination of care  Note: This dictation was prepared with Dragon dictation along with smaller phrase technology. Any  transcriptional errors that result from this process are unintentional.  Fritzi Mandes M.D    [facility]   CC: Primary care physician; Marinda Elk, MD Patient ID: Jenne Campus, female   DOB: 1945/02/13, 75 y.o.   MRN: [id number]

## 2020-11-20 NOTE — ED Notes (Signed)
Message sent to pharmacy for arava

## 2020-11-20 NOTE — ED Notes (Signed)
Pt up to toilet in room, tolerate well but had increase in heart rate and work of breathing.  Patient will try a purewick device.  Purewick placed.

## 2020-11-20 NOTE — ED Notes (Signed)
NT assisted Pt to the bedside commode.

## 2020-11-20 NOTE — ED Notes (Signed)
Patient provided with hospital bed for comfort.  Assisted to use bedside commode and back to bed.  Warm blankets provided for comfort.  Patient updated on plan of care.

## 2020-11-20 NOTE — Progress Notes (Signed)
*  PRELIMINARY RESULTS* Echocardiogram 2D Echocardiogram has been performed.  Lenor Coffin 11/20/2020, 11:41 AM

## 2020-11-21 MED ORDER — DILTIAZEM HCL 30 MG PO TABS
30.0000 mg | ORAL_TABLET | Freq: Four times a day (QID) | ORAL | Status: DC
  Administered 2020-11-21 – 2020-11-22 (×4): 30 mg via ORAL
  Filled 2020-11-21 (×4): qty 1

## 2020-11-21 NOTE — ED Notes (Signed)
Pt states she would like to wait a little while to take her ferrous sulfate- pt given breakfast tray, will do budesonide after pt eats

## 2020-11-21 NOTE — Progress Notes (Signed)
[facility]  - [place] at [facility]   PATIENT NAME: Courtney Wilcox    MR#:  [id number]  DATE OF BIRTH:  11-03-1945  SUBJECTIVE:   patient was sent in from pulmonary office after her CT scan of the chest as outpatient showed PE. Patient was followed up for ongoing cough and shortness of breath. She follows with [facility]  daughter at bedside in the ER. Patient discusses with me a lot of her issues with anxiety/panic at home. She is worried about too many things. Explain to her at length. Daughter was appreciative of the conversation.  REVIEW OF SYSTEMS:   Review of Systems  Constitutional:  Negative for chills, fever and weight loss.  HENT:  Negative for ear discharge, ear pain and nosebleeds.   Eyes:  Negative for blurred vision, pain and discharge.  Respiratory:  Positive for cough and shortness of breath. Negative for sputum production, wheezing and stridor.   Cardiovascular:  Positive for palpitations. Negative for chest pain, orthopnea and PND.  Gastrointestinal:  Negative for abdominal pain, diarrhea, nausea and vomiting.  Genitourinary:  Negative for frequency and urgency.  Musculoskeletal:  Negative for back pain and joint pain.  Neurological:  Negative for sensory change, speech change, focal weakness and weakness.  Psychiatric/Behavioral:  Negative for depression and hallucinations. The patient is not nervous/anxious.   Tolerating Diet:yes Tolerating PT:   DRUG ALLERGIES:   Allergies  Allergen Reactions   Lactose Other (See Comments) and Diarrhea   Other Other (See Comments)    Pt ONLY tolerates SLOW-FE (slow released iron)---upsets IBS   Biaxin [Clarithromycin] Other (See Comments)    GI upset    Hydrocodone-Acetaminophen Itching    Tolerates acetaminophen    Oxycodone Itching   Sulfa Antibiotics Other (See Comments) and Diarrhea    GI upset  GI upset GI upset    Sulfasalazine Diarrhea    VITALS:  Blood pressure (!)  141/84, pulse 91, temperature (!) 97.3 F (36.3 C), temperature source Oral, resp. rate 15, height 5\' 1"  (1.549 m), weight 86.2 kg, SpO2 98 %.  PHYSICAL EXAMINATION:   Physical Exam  GENERAL:  75 y.o.-year-old patient lying in the bed with no acute distress.  LUNGS: Normal breath sounds bilaterally, no wheezing, rales, rhonchi. No use of accessory muscles of respiration.  CARDIOVASCULAR: S1, S2 normal. No murmurs, rubs, or gallops. Tachy-brady ABDOMEN: Soft, nontender, nondistended. Bowel sounds present. No organomegaly or mass.  EXTREMITIES: No cyanosis, clubbing or edema b/l.    NEUROLOGIC: Cranial nerves II through XII are intact. No focal Motor or sensory deficits b/l.   PSYCHIATRIC:  patient is alert and oriented x 3.  SKIN: No obvious rash, lesion, or ulcer.   LABORATORY PANEL:  CBC Recent Labs  Lab 11/20/20 0656  WBC 5.3  HGB 10.1*  HCT 32.5*  PLT 123*     Chemistries  Recent Labs  Lab 11/20/20 0656  NA 141  K 3.5  CL 109  CO2 26  GLUCOSE 94  BUN 13  CREATININE 1.03*  CALCIUM 8.3*  MG 2.3  AST 22  ALT 18  ALKPHOS 63  BILITOT 0.8    Cardiac Enzymes No results for input(s): TROPONINI in the last 168 hours. RADIOLOGY:  13/05/22 Venous Img Lower Bilateral (DVT)  Result Date: 11/20/2020 CLINICAL DATA:  Known pulmonary embolism EXAM: BILATERAL LOWER EXTREMITY VENOUS DOPPLER ULTRASOUND TECHNIQUE: Gray-scale sonography with graded compression, as well as color Doppler and duplex ultrasound were performed to evaluate the lower extremity deep  venous systems from the level of the common femoral vein and including the common femoral, femoral, profunda femoral, popliteal and calf veins including the posterior tibial, peroneal and gastrocnemius veins when visible. The superficial great saphenous vein was also interrogated. Spectral Doppler was utilized to evaluate flow at rest and with distal augmentation maneuvers in the common femoral, femoral and popliteal veins. COMPARISON:   None. FINDINGS: RIGHT LOWER EXTREMITY Common Femoral Vein: No evidence of thrombus. Normal compressibility, respiratory phasicity and response to augmentation. Saphenofemoral Junction: No evidence of thrombus. Normal compressibility and flow on color Doppler imaging. Profunda Femoral Vein: No evidence of thrombus. Normal compressibility and flow on color Doppler imaging. Femoral Vein: No evidence of thrombus. Normal compressibility, respiratory phasicity and response to augmentation. Popliteal Vein: No evidence of thrombus. Normal compressibility, respiratory phasicity and response to augmentation. Calf Veins: No evidence of thrombus. Normal compressibility and flow on color Doppler imaging. Superficial Great Saphenous Vein: No evidence of thrombus. Normal compressibility. Venous Reflux:  None. Other Findings:  None. LEFT LOWER EXTREMITY Common Femoral Vein: No evidence of thrombus. Normal compressibility, respiratory phasicity and response to augmentation. Saphenofemoral Junction: No evidence of thrombus. Normal compressibility and flow on color Doppler imaging. Profunda Femoral Vein: No evidence of thrombus. Normal compressibility and flow on color Doppler imaging. Femoral Vein: No evidence of thrombus. Normal compressibility, respiratory phasicity and response to augmentation. Popliteal Vein: No evidence of thrombus. Normal compressibility, respiratory phasicity and response to augmentation. Calf Veins: No evidence of thrombus. Normal compressibility and flow on color Doppler imaging. Superficial Great Saphenous Vein: No evidence of thrombus. Normal compressibility. Venous Reflux:  None. Other Findings:  None. IMPRESSION: No evidence of deep venous thrombosis in either lower extremity. Electronically Signed   By: Inez Catalina M.D.   On: 11/20/2020 00:51   ECHOCARDIOGRAM COMPLETE  Result Date: 11/20/2020    ECHOCARDIOGRAM REPORT   Patient Name:   CLEOPHAS GAYDON Date of Exam: 11/20/2020 Medical Rec #:   [id number]         Height:       61.0 in Accession #:    [id number]        Weight:       190.0 lb Date of Birth:  1945-02-13         BSA:          1.848 m Patient Age:    49 years          BP:           130/84 mmHg Patient Gender: F                 HR:           63 bpm. Exam Location:  [facility] Procedure: 2D Echo Indications:     Atrial Flutter I48.92  History:         Patient has no prior history of Echocardiogram examinations.  Sonographer:     Kathlen Brunswick RDCS Referring Phys:  [place] Diagnosing Phys: [place]  1. Left ventricular ejection fraction, by estimation, is 45 to 50%. The left ventricle has mildly decreased function. The left ventricle demonstrates global hypokinesis. The left ventricular internal cavity size was mildly to moderately dilated. There is mild concentric left ventricular hypertrophy. Left ventricular diastolic parameters are consistent with Grade I diastolic dysfunction (impaired relaxation).  2. Right ventricular systolic function is mildly reduced. The right ventricular size is normal.  3. Left atrial size was severely dilated.  4. Right atrial size  was moderately dilated.  5. The mitral valve is grossly normal. Mild to moderate mitral valve regurgitation.  6. Tricuspid valve regurgitation is mild to moderate.  7. The aortic valve is calcified. Aortic valve regurgitation is not visualized. Mild aortic valve sclerosis is present, with no evidence of aortic valve stenosis. FINDINGS  Left Ventricle: Left ventricular ejection fraction, by estimation, is 45 to 50%. The left ventricle has mildly decreased function. The left ventricle demonstrates global hypokinesis. The left ventricular internal cavity size was mildly to moderately dilated. There is mild concentric left ventricular hypertrophy. Left ventricular diastolic parameters are consistent with Grade I diastolic dysfunction (impaired relaxation). Right Ventricle: The right ventricular size is normal. No increase in  right ventricular wall thickness. Right ventricular systolic function is mildly reduced. Left Atrium: Left atrial size was severely dilated. Right Atrium: Right atrial size was moderately dilated. Pericardium: There is no evidence of pericardial effusion. Mitral Valve: The mitral valve is grossly normal. Mild to moderate mitral valve regurgitation. Tricuspid Valve: The tricuspid valve is grossly normal. Tricuspid valve regurgitation is mild to moderate. Aortic Valve: The aortic valve is calcified. Aortic valve regurgitation is not visualized. Mild aortic valve sclerosis is present, with no evidence of aortic valve stenosis. Aortic valve peak gradient measures 5.3 mmHg. Pulmonic Valve: The pulmonic valve was normal in structure. Pulmonic valve regurgitation is trivial. Aorta: The aortic root, ascending aorta and aortic arch are all structurally normal, with no evidence of dilitation or obstruction. IAS/Shunts: The interatrial septum was not well visualized.  LEFT VENTRICLE PLAX 2D LVIDd:         4.94 cm     Diastology LVIDs:         3.68 cm     LV e' medial:   4.57 cm/s LV PW:         1.29 cm     LV E/e' medial: 20.6 LV IVS:        1.34 cm LVOT diam:     1.80 cm LV SV:         44 LV SV Index:   24 LVOT Area:     2.54 cm  LV Volumes (MOD) LV vol d, MOD A4C: 90.5 ml LV vol s, MOD A4C: 39.7 ml LV SV MOD A4C:     90.5 ml RIGHT VENTRICLE RV Basal diam:  2.79 cm RV S prime:     11.10 cm/s TAPSE (M-mode): 1.6 cm LEFT ATRIUM             Index        RIGHT ATRIUM           Index LA diam:        3.70 cm 2.00 cm/m   RA Area:     12.90 cm LA Vol (A2C):   52.6 ml 28.46 ml/m  RA Volume:   30.20 ml  16.34 ml/m LA Vol (A4C):   65.6 ml 35.50 ml/m LA Biplane Vol: 63.4 ml 34.31 ml/m  AORTIC VALVE                 PULMONIC VALVE AV Area (Vmax): 1.73 cm     PV Vmax:       0.65 m/s AV Vmax:        115.00 cm/s  PV Peak grad:  1.7 mmHg AV Peak Grad:   5.3 mmHg LVOT Vmax:      78.30 cm/s LVOT Vmean:     51.400 cm/s LVOT VTI:  0.172 m  AORTA Ao Root diam: 3.10 cm Ao Asc diam:  2.60 cm MITRAL VALVE MV Area (PHT): 4.46 cm    SHUNTS MV Decel Time: 170 msec    Systemic VTI:  0.17 m MV E velocity: 94.15 cm/s  Systemic Diam: 1.80 cm MV A velocity: 57.80 cm/s MV E/A ratio:  1.63 [employer] signed by Neoma Laming Signature Date/Time: 11/20/2020/12:12:53 PM    Final    ASSESSMENT AND PLAN:  75 year old female with a history of rheumatoid arthritis, hyperlipidemia, asthma presents to the ER today from home.  She was seen yesterday by her pulmonologist.  She has been having increasing shortness of breath.  D-dimer was positive as an outpatient.  Patient underwent CTPA today which demonstrated bilateral subsegmental PE.  Sent to the ER for evaluation.   Patient states that she has been having more difficulty breathing of the last week.  Her inhalers have not been working.  Patient does have a history of atrial flutter.  She is not on any anticoagulation.  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 Risk and benefits of blood thinners discussed  Arrhythmia-- SVT versus flutter fib -- patient has history of atrial flutter/fibrillation per old records -- will resume metoprolol 25 BID -- try wean her off diltiazem drip -- currently on eliquis for PE should be adequate for anticoagulation -- cardiology consultation with Dr. Yancey Flemings (covering for [facility]) --will add cardizem po today and wean her off the gtt  rheumatoid arthritis -- patient on plaquenil and Arava  history of asthma -- continue bronchodilators -- sats 99-100% on room air  Chronic anxiety -- continue PRN Xanax, continue Celexa, discussed with patient regarding mindfulness, meditation and listening to music hopefully to help her with her  anxiety.  Procedures: Family communication : daughter at bedside  consults :[facility] cards CODE STATUS: full DVT Prophylaxis :Eliquis Level of care: Progressive Cardiac Status is: Inpatient  Remains inpatient appropriate because: PE and arrhythmia  anticipate discharge tomorrow if remains stable.        TOTAL TIME TAKING CARE OF THIS PATIENT: 25 minutes.  >50% time spent on counselling and coordination of care  Note: This dictation was prepared with Dragon dictation along with smaller phrase technology. Any transcriptional errors that result from this process are unintentional.  Fritzi Mandes M.D    [facility]   CC: Primary care physician; Marinda Elk, MD Patient ID: Jenne Campus, female   DOB: [birth year], 75 y.o.   MRN: [id number]

## 2020-11-21 NOTE — Consult Note (Signed)
Courtney Wilcox is a 75 y.o. female  [id number]  Primary Cardiologist: Neoma Laming Reason for Consultation: Paroxysmal atrial fibrillation  HPI: 75 year old white female with a history of multiple medical problems including sleep apnea was diagnosed of pulmonary embolism by pulmonologist and was sent to the emergency room yesterday since she was having paroxysmal atrial fibrillation along with occasional sinus tachycardia but currently in sinus rhythm.  She denies any chest pain or shortness of breath.   Review of Systems: Yesterday felt palpitation and shortness of breath   Past Medical History:  Diagnosis Date   Allergic rhinitis    Anemia    Anxiety    Arthritis    Asthma    Atrophic vaginitis    B12 deficiency    Bronchitis, chronic (HCC)    Cervical disc disease    Collagen vascular disease (HCC)    RA   Depression    Fibrocystic breast disease    Foot ulcer (HCC)    GERD (gastroesophageal reflux disease)    Hammer toe    Hyperlipidemia    IBS (irritable bowel syndrome)    Obesity    Obstructive sleep apnea    OSA (obstructive sleep apnea)    Osteoarthrosis    Multi site   Osteopenia    Rheumatoid arthritis (HCC)    Shingles    Thyroid nodule    MULTIPLE   Tonsillitis    recurrent   Umbilical hernia     (Not in a hospital admission)     apixaban  10 mg Oral BID   Followed by   Derrill Memo ON 11/26/2020] apixaban  5 mg Oral BID   budesonide  0.25 mg Nebulization Daily   cholecalciferol  4,000 Units Oral Daily   citalopram  20 mg Oral QPM   famotidine  10 mg Oral Daily   ferrous sulfate  325 mg Oral Q breakfast   hydroxychloroquine  200 mg Oral Q lunch   leflunomide  20 mg Oral Daily   metoprolol tartrate  25 mg Oral BID   pantoprazole  40 mg Oral Daily    Infusions:  diltiazem (CARDIZEM) infusion 10 mg/hr (11/21/20 0416)    Allergies  Allergen Reactions   Lactose Other (See Comments) and Diarrhea   Other Other (See Comments)    Pt ONLY  tolerates SLOW-FE (slow released iron)---upsets IBS   Biaxin [Clarithromycin] Other (See Comments)    GI upset    Hydrocodone-Acetaminophen Itching    Tolerates acetaminophen    Oxycodone Itching   Sulfa Antibiotics Other (See Comments) and Diarrhea    GI upset  GI upset GI upset    Sulfasalazine Diarrhea    Social History   Socioeconomic History   Marital status: Married    Spouse name: Not on file   Number of children: Not on file   Years of education: Not on file   Highest education level: Not on file  Occupational History   Not on file  Tobacco Use   Smoking status: Never   Smokeless tobacco: Never  Vaping Use   Vaping Use: Never used  Substance and Sexual Activity   Alcohol use: No   Drug use: No   Sexual activity: Not on file  Other Topics Concern   Not on file  Social History Narrative   Not on file   Social Determinants of Health   Financial Resource Strain: Not on file  Food Insecurity: Not on file  Transportation Needs: Not on file  Physical  Activity: Not on file  Stress: Not on file  Social Connections: Not on file  Intimate Partner Violence: Not on file    Family History  Problem Relation Age of Onset   Asthma Mother    Breast cancer Mother 88   Emphysema Father        smoked   Heart disease Father    Lung cancer Father        smoked    PHYSICAL EXAM: Vitals:   11/21/20 0952 11/21/20 1000  BP:    Pulse: 70 68  Resp:  (!) 22  Temp:    SpO2:  96%     Intake/Output Summary (Last 24 hours) at 11/21/2020 1052 Last data filed at 11/21/2020 0416 Gross per 24 hour  Intake 99.43 ml  Output --  Net 99.43 ml    General:  Well appearing. No respiratory difficulty HEENT: normal Neck: supple. no JVD. Carotids 2+ bilat; no bruits. No lymphadenopathy or thryomegaly appreciated. Cor: PMI nondisplaced. Regular rate & rhythm. No rubs, gallops or murmurs. Lungs: clear Abdomen: soft, nontender, nondistended. No hepatosplenomegaly. No bruits  or masses. Good bowel sounds. Extremities: no cyanosis, clubbing, rash, edema Neuro: alert & oriented x 3, cranial nerves grossly intact. moves all 4 extremities w/o difficulty. Affect pleasant.  ECG: EKG today shows sinus rhythm with nonspecific ST-T changes and occasional PVCs but EKG yesterday and day before showed paroxysmal atrial fibrillation and sinus tachycardia  No results found for this or any previous visit (from the past 24 hour(s)). CT Angio Chest Pulmonary Embolism (PE) W or WO Contrast  Result Date: 11/19/2020 CLINICAL DATA:  No known injury, shortness of breath EXAM: CT ANGIOGRAPHY CHEST WITH CONTRAST TECHNIQUE: Multidetector CT imaging of the chest was performed using the standard protocol during bolus administration of intravenous contrast. Multiplanar CT image reconstructions and MIPs were obtained to evaluate the vascular anatomy. CONTRAST:  35mL OMNIPAQUE IOHEXOL 350 MG/ML SOLN COMPARISON:  None. FINDINGS: Cardiovascular: Satisfactory opacification of the pulmonary arteries to the segmental level. Small segmental pulmonary embolus in the left lower lobe and subsegmental branch of the right upper lobe. Main pulmonary artery is mildly dilated measuring 3.3 cm in diameter as can be seen with pulmonary arterial hypertension. Normal heart size. No pericardial effusion. Thoracic aortic atherosclerosis. Multi vessel coronary artery atherosclerosis. Mediastinum/Nodes: No enlarged mediastinal, hilar, or axillary lymph nodes. Thyroid gland, trachea, and esophagus demonstrate no significant findings. Lungs/Pleura: Lungs are clear. No pleural effusion or pneumothorax. Upper Abdomen: No acute abnormality. Musculoskeletal: No acute osseous abnormality. No aggressive osseous lesion. Review of the MIP images confirms the above findings. IMPRESSION: 1. Small segmental pulmonary embolus in the left lower lobe and subsegmental branch of the right upper lobe. Critical Value/emergent results were called by  telephone at the time of interpretation on 11/19/2020 at 12:26 pm to provider FUAD ALESKEROV , who verbally acknowledged these results. Electronically Signed   By: Kathreen Devoid M.D.   On: 11/19/2020 12:37   US Venous Img Lower Bilateral (DVT)  Result Date: 11/20/2020 CLINICAL DATA:  Known pulmonary embolism EXAM: BILATERAL LOWER EXTREMITY VENOUS DOPPLER ULTRASOUND TECHNIQUE: Gray-scale sonography with graded compression, as well as color Doppler and duplex ultrasound were performed to evaluate the lower extremity deep venous systems from the level of the common femoral vein and including the common femoral, femoral, profunda femoral, popliteal and calf veins including the posterior tibial, peroneal and gastrocnemius veins when visible. The superficial great saphenous vein was also interrogated. Spectral Doppler was utilized to evaluate flow  at rest and with distal augmentation maneuvers in the common femoral, femoral and popliteal veins. COMPARISON:  None. FINDINGS: RIGHT LOWER EXTREMITY Common Femoral Vein: No evidence of thrombus. Normal compressibility, respiratory phasicity and response to augmentation. Saphenofemoral Junction: No evidence of thrombus. Normal compressibility and flow on color Doppler imaging. Profunda Femoral Vein: No evidence of thrombus. Normal compressibility and flow on color Doppler imaging. Femoral Vein: No evidence of thrombus. Normal compressibility, respiratory phasicity and response to augmentation. Popliteal Vein: No evidence of thrombus. Normal compressibility, respiratory phasicity and response to augmentation. Calf Veins: No evidence of thrombus. Normal compressibility and flow on color Doppler imaging. Superficial Great Saphenous Vein: No evidence of thrombus. Normal compressibility. Venous Reflux:  None. Other Findings:  None. LEFT LOWER EXTREMITY Common Femoral Vein: No evidence of thrombus. Normal compressibility, respiratory phasicity and response to augmentation.  Saphenofemoral Junction: No evidence of thrombus. Normal compressibility and flow on color Doppler imaging. Profunda Femoral Vein: No evidence of thrombus. Normal compressibility and flow on color Doppler imaging. Femoral Vein: No evidence of thrombus. Normal compressibility, respiratory phasicity and response to augmentation. Popliteal Vein: No evidence of thrombus. Normal compressibility, respiratory phasicity and response to augmentation. Calf Veins: No evidence of thrombus. Normal compressibility and flow on color Doppler imaging. Superficial Great Saphenous Vein: No evidence of thrombus. Normal compressibility. Venous Reflux:  None. Other Findings:  None. IMPRESSION: No evidence of deep venous thrombosis in either lower extremity. Electronically Signed   By: Inez Catalina M.D.   On: 11/20/2020 00:51   ECHOCARDIOGRAM COMPLETE  Result Date: 11/20/2020    ECHOCARDIOGRAM REPORT   Patient Name:   Courtney Wilcox Date of Exam: 11/20/2020 Medical Rec #:  [id number]         Height:       61.0 in Accession #:    [id number]        Weight:       190.0 lb Date of Birth:  1945/05/28         BSA:          1.848 m Patient Age:    27 years          BP:           130/84 mmHg Patient Gender: F                 HR:           63 bpm. Exam Location:  [facility] Procedure: 2D Echo Indications:     Atrial Flutter I48.92  History:         Patient has no prior history of Echocardiogram examinations.  Sonographer:     Kathlen Brunswick RDCS Referring Phys:  [place] Diagnosing Phys: [place]  1. Left ventricular ejection fraction, by estimation, is 45 to 50%. The left ventricle has mildly decreased function. The left ventricle demonstrates global hypokinesis. The left ventricular internal cavity size was mildly to moderately dilated. There is mild concentric left ventricular hypertrophy. Left ventricular diastolic parameters are consistent with Grade I diastolic dysfunction (impaired relaxation).  2. Right ventricular  systolic function is mildly reduced. The right ventricular size is normal.  3. Left atrial size was severely dilated.  4. Right atrial size was moderately dilated.  5. The mitral valve is grossly normal. Mild to moderate mitral valve regurgitation.  6. Tricuspid valve regurgitation is mild to moderate.  7. The aortic valve is calcified. Aortic valve regurgitation is not visualized. Mild aortic valve sclerosis is present, with no  evidence of aortic valve stenosis. FINDINGS  Left Ventricle: Left ventricular ejection fraction, by estimation, is 45 to 50%. The left ventricle has mildly decreased function. The left ventricle demonstrates global hypokinesis. The left ventricular internal cavity size was mildly to moderately dilated. There is mild concentric left ventricular hypertrophy. Left ventricular diastolic parameters are consistent with Grade I diastolic dysfunction (impaired relaxation). Right Ventricle: The right ventricular size is normal. No increase in right ventricular wall thickness. Right ventricular systolic function is mildly reduced. Left Atrium: Left atrial size was severely dilated. Right Atrium: Right atrial size was moderately dilated. Pericardium: There is no evidence of pericardial effusion. Mitral Valve: The mitral valve is grossly normal. Mild to moderate mitral valve regurgitation. Tricuspid Valve: The tricuspid valve is grossly normal. Tricuspid valve regurgitation is mild to moderate. Aortic Valve: The aortic valve is calcified. Aortic valve regurgitation is not visualized. Mild aortic valve sclerosis is present, with no evidence of aortic valve stenosis. Aortic valve peak gradient measures 5.3 mmHg. Pulmonic Valve: The pulmonic valve was normal in structure. Pulmonic valve regurgitation is trivial. Aorta: The aortic root, ascending aorta and aortic arch are all structurally normal, with no evidence of dilitation or obstruction. IAS/Shunts: The interatrial septum was not well visualized.  LEFT  VENTRICLE PLAX 2D LVIDd:         4.94 cm     Diastology LVIDs:         3.68 cm     LV e' medial:   4.57 cm/s LV PW:         1.29 cm     LV E/e' medial: 20.6 LV IVS:        1.34 cm LVOT diam:     1.80 cm LV SV:         44 LV SV Index:   24 LVOT Area:     2.54 cm  LV Volumes (MOD) LV vol d, MOD A4C: 90.5 ml LV vol s, MOD A4C: 39.7 ml LV SV MOD A4C:     90.5 ml RIGHT VENTRICLE RV Basal diam:  2.79 cm RV S prime:     11.10 cm/s TAPSE (M-mode): 1.6 cm LEFT ATRIUM             Index        RIGHT ATRIUM           Index LA diam:        3.70 cm 2.00 cm/m   RA Area:     12.90 cm LA Vol (A2C):   52.6 ml 28.46 ml/m  RA Volume:   30.20 ml  16.34 ml/m LA Vol (A4C):   65.6 ml 35.50 ml/m LA Biplane Vol: 63.4 ml 34.31 ml/m  AORTIC VALVE                 PULMONIC VALVE AV Area (Vmax): 1.73 cm     PV Vmax:       0.65 m/s AV Vmax:        115.00 cm/s  PV Peak grad:  1.7 mmHg AV Peak Grad:   5.3 mmHg LVOT Vmax:      78.30 cm/s LVOT Vmean:     51.400 cm/s LVOT VTI:       0.172 m  AORTA Ao Root diam: 3.10 cm Ao Asc diam:  2.60 cm MITRAL VALVE MV Area (PHT): 4.46 cm    SHUNTS MV Decel Time: 170 msec    Systemic VTI:  0.17 m MV E velocity: 94.15 cm/s  Systemic Diam:  1.80 cm MV A velocity: 57.80 cm/s MV E/A ratio:  1.63 Courtney Wilcox Electronically signed by Neoma Laming Signature Date/Time: 11/20/2020/12:12:53 PM    Final      ASSESSMENT AND PLAN: Atrial fibrillation which appears to be paroxysmal as currently in sinus rhythm.  Patient can be given p.o. Cardizem and Eliquis.  Will get echocardiogram to further evaluate and make further recommendations.  Patient has pulmonary embolism which probably causing paroxysmal atrial fibrillation.  Courtney Wilcox A

## 2020-11-21 NOTE — ED Notes (Signed)
Pt not started back to cardizem gtt as she continues to self convert to SB at rate of 40's to 50's

## 2020-11-21 NOTE — ED Notes (Signed)
This RN responds to call light, Pt disconnected from monitor to ambulate to bathroom. Able to void sans complications. Pt notably anxious, therapeutic communication attempted. Pt expresses relief. Call light in reach. Denies further needs at this time.

## 2020-11-22 MED ORDER — DILTIAZEM HCL 60 MG PO TABS: 60.0000 mg | ORAL_TABLET | Freq: Two times a day (BID) | ORAL | 2 refills | Status: DC

## 2020-11-22 MED ORDER — APIXABAN 5 MG PO TABS: 10.0000 mg | ORAL_TABLET | Freq: Two times a day (BID) | ORAL | 3 refills | Status: DC

## 2020-11-22 MED ORDER — METOPROLOL TARTRATE 25 MG PO TABS: 25.0000 mg | ORAL_TABLET | Freq: Two times a day (BID) | ORAL | 3 refills | Status: DC

## 2020-11-22 MED ORDER — DILTIAZEM HCL 30 MG PO TABS: 60.0000 mg | ORAL_TABLET | Freq: Two times a day (BID) | ORAL | Status: DC

## 2020-11-22 NOTE — Consult Note (Addendum)
Pulmonary Medicine          Date: 11/22/2020,   MRN# [id number] Courtney Wilcox October 08, 1945     AdmissionWeight: 86.2 kg                 CurrentWeight: 84.7 kg   Referring physician: Dr Posey Pronto    CHIEF COMPLAINT:   Subsegmental pulmonary venous thromboembolism   HISTORY OF PRESENT ILLNESS   75 year old female with a history of rheumatoid arthritis, hyperlipidemia, asthma presents to the ER today from home.  She was seen yesterday by her pulmonologist.  She has been having increasing shortness of breath.  D-dimer was positive as an outpatient.  Patient underwent CTPA today which demonstrated bilateral subsegmental PE.  Sent to the ER for evaluation.  Patient states that she has been having more difficulty breathing of the last week.  Her inhalers have not been working.  Patient does have a history of atrial flutter.  She is not on any anticoagulation.  This is on EKG from May 2019.  Patient is followed by [facility] cardiology.  Not clear if her EKG from May 2019 was reviewed by cardiology.  Patient has not taken any anticoagulants for atrial flutter.  She has a prior history of DVT in her left leg that she says was over 30 years ago.  Patient states that intermittently over the last several months, she has noticed that she has left arm pain radiating to her left shoulder blade and back.  She is not sure if she has felt any palpitations.  In the ER, patient noted to be tachycardic with a heart rate 141.  This continued despite 5 mg of IV Lopressor.  When the patient's heart rate was in the high 130s and 140s, the patient was complaining of left arm pain and back pain.  She states this is the same pain she has had at home.  She wonders if her arm pain and back pain is related to her fast heart rate.  6 mg of IV adenosine were given which did slow her down to approximately 60 bpm.  PCCM called to evaluate clinical significance of PE due to patient request.    PAST MEDICAL HISTORY    Past Medical History:  Diagnosis Date   Allergic rhinitis    Anemia    Anxiety    Arthritis    Asthma    Atrophic vaginitis    B12 deficiency    Bronchitis, chronic ([place])    Cervical disc disease    Collagen vascular disease ([place])    RA   Depression    Fibrocystic breast disease    Foot ulcer ([place])    GERD (gastroesophageal reflux disease)    Hammer toe    Hyperlipidemia    IBS (irritable bowel syndrome)    Obesity    Obstructive sleep apnea    OSA (obstructive sleep apnea)    Osteoarthrosis    Multi site   Osteopenia    Rheumatoid arthritis ([place])    Shingles    Thyroid nodule    MULTIPLE   Tonsillitis    recurrent   Umbilical hernia      SURGICAL HISTORY   Past Surgical History:  Procedure Laterality Date   BACK SURGERY  2011   LAMINECTOMY   BREAST BIOPSY Right    neg   CERVICAL FUSION  2011, 2012   X 2   COLONOSCOPY  2005, 2015   COLONOSCOPY WITH PROPOFOL N/A 08/04/2020   Procedure:  COLONOSCOPY WITH PROPOFOL;  Surgeon: Robert Bellow, MD;  Location: [facility] ENDOSCOPY;  Service: Endoscopy;  Laterality: N/A;   ESOPHAGOGASTRODUODENOSCOPY ENDOSCOPY  2005, 2013, 2015   EYE SURGERY Bilateral    Cataract Extraction with IOL    HERNIA REPAIR     JOINT REPLACEMENT     KNEE ARTHROPLASTY Left 07/12/2016   Procedure: COMPUTER ASSISTED TOTAL KNEE ARTHROPLASTY;  Surgeon: Dereck Leep, MD;  Location: [facility] ORS;  Service: Orthopedics;  Laterality: Left;   Laryngeal tear after intubation w/repair     LUMBAR DISC SURGERY  12/2009   REPLACEMENT TOTAL KNEE Right 06/2007   DR. HOOTEN, [facility]   REPLACEMENT UNICONDYLAR JOINT KNEE     DR. HOOTEN, [facility]   SHOULDER ARTHROSCOPY WITH ROTATOR CUFF REPAIR Right    TONSILLECTOMY     TOTAL HIP ARTHROPLASTY Right 01/31/2017   Procedure: TOTAL HIP ARTHROPLASTY;  Surgeon: Dereck Leep, MD;  Location: [facility] ORS;  Service: Orthopedics;  Laterality: Right;   TUBAL LIGATION     UPPER GI ENDOSCOPY       FAMILY HISTORY   Family  History  Problem Relation Age of Onset   Asthma Mother    Breast cancer Mother 90   Emphysema Father        smoked   Heart disease Father    Lung cancer Father        smoked     SOCIAL HISTORY   Social History   Tobacco Use   Smoking status: Never   Smokeless tobacco: Never  Vaping Use   Vaping Use: Never used  Substance Use Topics   Alcohol use: No   Drug use: No     MEDICATIONS    Home Medication:    Current Medication:  Current Facility-Administered Medications:    acetaminophen (TYLENOL) tablet 650 mg, 650 mg, Oral, Q6H PRN, 650 mg at 11/21/20 0951 **OR** acetaminophen (TYLENOL) suppository 650 mg, 650 mg, Rectal, Q6H PRN, Kristopher Oppenheim, DO   ALPRAZolam Duanne Moron) tablet 0.25 mg, 0.25 mg, Oral, Daily PRN, Kristopher Oppenheim, DO, 0.25 mg at 11/21/20 2253   apixaban (ELIQUIS) tablet 10 mg, 10 mg, Oral, BID, 10 mg at 11/22/20 0900 **FOLLOWED BY** [START ON 11/26/2020] apixaban (ELIQUIS) tablet 5 mg, 5 mg, Oral, BID, Bridgett Larsson, Eric, DO   budesonide (PULMICORT) nebulizer solution 0.25 mg, 0.25 mg, Nebulization, Daily, Posey Pronto, Sona, MD, 0.25 mg at 11/22/20 0830   cholecalciferol (VITAMIN D) tablet 4,000 Units, 4,000 Units, Oral, Daily, Fritzi Mandes, MD, 4,000 Units at 11/22/20 0900   citalopram (CELEXA) tablet 20 mg, 20 mg, Oral, QPM, Chen, Eric, DO, 20 mg at 11/20/20 1754   diltiazem (CARDIZEM) tablet 30 mg, 30 mg, Oral, Q6H, Fritzi Mandes, MD, 30 mg at 11/22/20 0517   famotidine (PEPCID) tablet 10 mg, 10 mg, Oral, Daily, Posey Pronto, Sona, MD, 10 mg at 11/22/20 0900   ferrous sulfate tablet 325 mg, 325 mg, Oral, Q breakfast, Fritzi Mandes, MD   hydroxychloroquine (PLAQUENIL) tablet 200 mg, 200 mg, Oral, Q lunch, Kristopher Oppenheim, DO, 200 mg at 11/21/20 1350   leflunomide (ARAVA) tablet 20 mg, 20 mg, Oral, Daily, Fritzi Mandes, MD, 20 mg at 11/21/20 0951   melatonin tablet 10 mg, 10 mg, Oral, QHS PRN, Kristopher Oppenheim, DO, 10 mg at 11/20/20 2218   metoprolol tartrate (LOPRESSOR) tablet 25 mg, 25 mg, Oral, BID,  Fritzi Mandes, MD, 25 mg at 11/22/20 0900   montelukast (SINGULAIR) tablet 10 mg, 10 mg, Oral, Daily PRN, Kristopher Oppenheim, DO   ondansetron [facility])  tablet 4 mg, 4 mg, Oral, Q6H PRN **OR** ondansetron (ZOFRAN) injection 4 mg, 4 mg, Intravenous, Q6H PRN, Carollee Herter, DO   pantoprazole (PROTONIX) EC tablet 40 mg, 40 mg, Oral, Daily, Carollee Herter, DO, 40 mg at 11/22/20 0900   simethicone (MYLICON) chewable tablet 80 mg, 80 mg, Oral, QID PRN, Enedina Finner, MD   zolpidem (AMBIEN) tablet 5 mg, 5 mg, Oral, QHS PRN, Enedina Finner, MD    ALLERGIES   Lactose, Other, Biaxin [clarithromycin], Hydrocodone-acetaminophen, Oxycodone, Sulfa antibiotics, and Sulfasalazine     REVIEW OF SYSTEMS    Review of Systems:  Gen:  Denies  fever, sweats, chills weigh loss  HEENT: Denies blurred vision, double vision, ear pain, eye pain, hearing loss, nose bleeds, sore throat Cardiac:  No dizziness, chest pain or heaviness, chest tightness,edema Resp:   Denies cough or sputum porduction, shortness of breath,wheezing, hemoptysis,  Gi: Denies swallowing difficulty, stomach pain, nausea or vomiting, diarrhea, constipation, bowel incontinence Gu:  Denies bladder incontinence, burning urine Ext:   Denies Joint pain, stiffness or swelling Skin: Denies  skin rash, easy bruising or bleeding or hives Endoc:  Denies polyuria, polydipsia , polyphagia or weight change Psych:   Denies depression, insomnia or hallucinations   Other:  All other systems negative   VS: BP (!) 158/93 (BP Location: Left Arm)   Pulse (!) 53   Temp 97.8 F (36.6 C) (Oral)   Resp 18   Ht 5\' 2"  (1.575 m)   Wt 84.7 kg   SpO2 99%   BMI 34.17 kg/m      PHYSICAL EXAM    GENERAL:NAD, no fevers, chills, no weakness no fatigue HEAD: Normocephalic, atraumatic.  EYES: Pupils equal, round, reactive to light. Extraocular muscles intact. No scleral icterus.  MOUTH: Moist mucosal membrane. Dentition intact. No abscess noted.  EAR, NOSE, THROAT: Clear  without exudates. No external lesions.  NECK: Supple. No thyromegaly. No nodules. No JVD.  PULMONARY: clear to auscultation.  CARDIOVASCULAR: S1 and S2. Regular rate and rhythm. No murmurs, rubs, or gallops. No edema. Pedal pulses 2+ bilaterally.  GASTROINTESTINAL: Soft, nontender, nondistended. No masses. Positive bowel sounds. No hepatosplenomegaly.  MUSCULOSKELETAL: No swelling, clubbing, or edema. Range of motion full in all extremities.  NEUROLOGIC: Cranial nerves II through XII are intact. No gross focal neurological deficits. Sensation intact. Reflexes intact.  SKIN: No ulceration, lesions, rashes, or cyanosis. Skin warm and dry. Turgor intact.  PSYCHIATRIC: Mood, affect within normal limits. The patient is awake, alert and oriented x 3. Insight, judgment intact.       IMAGING    CT Angio Chest Pulmonary Embolism (PE) W or WO Contrast  Result Date: 11/19/2020 CLINICAL DATA:  No known injury, shortness of breath EXAM: CT ANGIOGRAPHY CHEST WITH CONTRAST TECHNIQUE: Multidetector CT imaging of the chest was performed using the standard protocol during bolus administration of intravenous contrast. Multiplanar CT image reconstructions and MIPs were obtained to evaluate the vascular anatomy. CONTRAST:  43mL OMNIPAQUE IOHEXOL 350 MG/ML SOLN COMPARISON:  None. FINDINGS: Cardiovascular: Satisfactory opacification of the pulmonary arteries to the segmental level. Small segmental pulmonary embolus in the left lower lobe and subsegmental branch of the right upper lobe. Main pulmonary artery is mildly dilated measuring 3.3 cm in diameter as can be seen with pulmonary arterial hypertension. Normal heart size. No pericardial effusion. Thoracic aortic atherosclerosis. Multi vessel coronary artery atherosclerosis. Mediastinum/Nodes: No enlarged mediastinal, hilar, or axillary lymph nodes. Thyroid gland, trachea, and esophagus demonstrate no significant findings. Lungs/Pleura: Lungs are clear. No  pleural  effusion or pneumothorax. Upper Abdomen: No acute abnormality. Musculoskeletal: No acute osseous abnormality. No aggressive osseous lesion. Review of the MIP images confirms the above findings. IMPRESSION: 1. Small segmental pulmonary embolus in the left lower lobe and subsegmental branch of the right upper lobe. Critical Value/emergent results were called by telephone at the time of interpretation on 11/19/2020 at 12:26 pm to provider Katsumi Wisler , who verbally acknowledged these results. Electronically Signed   By: Kathreen Devoid M.D.   On: 11/19/2020 12:37   US Venous Img Lower Bilateral (DVT)  Result Date: 11/20/2020 CLINICAL DATA:  Known pulmonary embolism EXAM: BILATERAL LOWER EXTREMITY VENOUS DOPPLER ULTRASOUND TECHNIQUE: Gray-scale sonography with graded compression, as well as color Doppler and duplex ultrasound were performed to evaluate the lower extremity deep venous systems from the level of the common femoral vein and including the common femoral, femoral, profunda femoral, popliteal and calf veins including the posterior tibial, peroneal and gastrocnemius veins when visible. The superficial great saphenous vein was also interrogated. Spectral Doppler was utilized to evaluate flow at rest and with distal augmentation maneuvers in the common femoral, femoral and popliteal veins. COMPARISON:  None. FINDINGS: RIGHT LOWER EXTREMITY Common Femoral Vein: No evidence of thrombus. Normal compressibility, respiratory phasicity and response to augmentation. Saphenofemoral Junction: No evidence of thrombus. Normal compressibility and flow on color Doppler imaging. Profunda Femoral Vein: No evidence of thrombus. Normal compressibility and flow on color Doppler imaging. Femoral Vein: No evidence of thrombus. Normal compressibility, respiratory phasicity and response to augmentation. Popliteal Vein: No evidence of thrombus. Normal compressibility, respiratory phasicity and response to augmentation. Calf Veins:  No evidence of thrombus. Normal compressibility and flow on color Doppler imaging. Superficial Great Saphenous Vein: No evidence of thrombus. Normal compressibility. Venous Reflux:  None. Other Findings:  None. LEFT LOWER EXTREMITY Common Femoral Vein: No evidence of thrombus. Normal compressibility, respiratory phasicity and response to augmentation. Saphenofemoral Junction: No evidence of thrombus. Normal compressibility and flow on color Doppler imaging. Profunda Femoral Vein: No evidence of thrombus. Normal compressibility and flow on color Doppler imaging. Femoral Vein: No evidence of thrombus. Normal compressibility, respiratory phasicity and response to augmentation. Popliteal Vein: No evidence of thrombus. Normal compressibility, respiratory phasicity and response to augmentation. Calf Veins: No evidence of thrombus. Normal compressibility and flow on color Doppler imaging. Superficial Great Saphenous Vein: No evidence of thrombus. Normal compressibility. Venous Reflux:  None. Other Findings:  None. IMPRESSION: No evidence of deep venous thrombosis in either lower extremity. Electronically Signed   By: Inez Catalina M.D.   On: 11/20/2020 00:51   ECHOCARDIOGRAM COMPLETE  Result Date: 11/20/2020    ECHOCARDIOGRAM REPORT   Patient Name:   ANNELYSE WAHEED Date of Exam: 11/20/2020 Medical Rec #:  [id number]         Height:       61.0 in Accession #:    [id number]        Weight:       190.0 lb Date of Birth:  [birth year]         BSA:          1.848 m Patient Age:    27 years          BP:           130/84 mmHg Patient Gender: F                 HR:           63 bpm. Exam Location:  [facility] Procedure: 2D Echo Indications:     Atrial Flutter I48.92  History:         Patient has no prior history of Echocardiogram examinations.  Sonographer:     Kathlen Brunswick RDCS Referring Phys:  [place] Diagnosing Phys: [place]  1. Left ventricular ejection fraction, by estimation, is 45 to 50%. The left  ventricle has mildly decreased function. The left ventricle demonstrates global hypokinesis. The left ventricular internal cavity size was mildly to moderately dilated. There is mild concentric left ventricular hypertrophy. Left ventricular diastolic parameters are consistent with Grade I diastolic dysfunction (impaired relaxation).  2. Right ventricular systolic function is mildly reduced. The right ventricular size is normal.  3. Left atrial size was severely dilated.  4. Right atrial size was moderately dilated.  5. The mitral valve is grossly normal. Mild to moderate mitral valve regurgitation.  6. Tricuspid valve regurgitation is mild to moderate.  7. The aortic valve is calcified. Aortic valve regurgitation is not visualized. Mild aortic valve sclerosis is present, with no evidence of aortic valve stenosis. FINDINGS  Left Ventricle: Left ventricular ejection fraction, by estimation, is 45 to 50%. The left ventricle has mildly decreased function. The left ventricle demonstrates global hypokinesis. The left ventricular internal cavity size was mildly to moderately dilated. There is mild concentric left ventricular hypertrophy. Left ventricular diastolic parameters are consistent with Grade I diastolic dysfunction (impaired relaxation). Right Ventricle: The right ventricular size is normal. No increase in right ventricular wall thickness. Right ventricular systolic function is mildly reduced. Left Atrium: Left atrial size was severely dilated. Right Atrium: Right atrial size was moderately dilated. Pericardium: There is no evidence of pericardial effusion. Mitral Valve: The mitral valve is grossly normal. Mild to moderate mitral valve regurgitation. Tricuspid Valve: The tricuspid valve is grossly normal. Tricuspid valve regurgitation is mild to moderate. Aortic Valve: The aortic valve is calcified. Aortic valve regurgitation is not visualized. Mild aortic valve sclerosis is present, with no evidence of aortic  valve stenosis. Aortic valve peak gradient measures 5.3 mmHg. Pulmonic Valve: The pulmonic valve was normal in structure. Pulmonic valve regurgitation is trivial. Aorta: The aortic root, ascending aorta and aortic arch are all structurally normal, with no evidence of dilitation or obstruction. IAS/Shunts: The interatrial septum was not well visualized.  LEFT VENTRICLE PLAX 2D LVIDd:         4.94 cm     Diastology LVIDs:         3.68 cm     LV e' medial:   4.57 cm/s LV PW:         1.29 cm     LV E/e' medial: 20.6 LV IVS:        1.34 cm LVOT diam:     1.80 cm LV SV:         44 LV SV Index:   24 LVOT Area:     2.54 cm  LV Volumes (MOD) LV vol d, MOD A4C: 90.5 ml LV vol s, MOD A4C: 39.7 ml LV SV MOD A4C:     90.5 ml RIGHT VENTRICLE RV Basal diam:  2.79 cm RV S prime:     11.10 cm/s TAPSE (M-mode): 1.6 cm LEFT ATRIUM             Index        RIGHT ATRIUM           Index LA diam:        3.70 cm 2.00 cm/m  RA Area:     12.90 cm LA Vol (A2C):   52.6 ml 28.46 ml/m  RA Volume:   30.20 ml  16.34 ml/m LA Vol (A4C):   65.6 ml 35.50 ml/m LA Biplane Vol: 63.4 ml 34.31 ml/m  AORTIC VALVE                 PULMONIC VALVE AV Area (Vmax): 1.73 cm     PV Vmax:       0.65 m/s AV Vmax:        115.00 cm/s  PV Peak grad:  1.7 mmHg AV Peak Grad:   5.3 mmHg LVOT Vmax:      78.30 cm/s LVOT Vmean:     51.400 cm/s LVOT VTI:       0.172 m  AORTA Ao Root diam: 3.10 cm Ao Asc diam:  2.60 cm MITRAL VALVE MV Area (PHT): 4.46 cm    SHUNTS MV Decel Time: 170 msec    Systemic VTI:  0.17 m MV E velocity: 94.15 cm/s  Systemic Diam: 1.80 cm MV A velocity: 57.80 cm/s MV E/A ratio:  1.63 [employer] signed by Neoma Laming Signature Date/Time: 11/20/2020/12:12:53 PM    Final       ASSESSMENT/PLAN   Subsegmental PE Small segmental pulmonary embolus in the left lower lobe and subsegmental branch of the right upper lobe. -patient is stable on room air she may continue eliquis 5mg  bid for 3-6 months and may need it for life if she  has permanent AF    Thank you for allowing me to participate in the care of this patient.  Total face to face encounter time for this patient visit was 45 min. >50% of the time was  spent in counseling and coordination of care.   Patient/Family are satisfied with care plan and all questions have been answered.  This document was prepared using Dragon voice recognition software and may include unintentional dictation errors.     Ottie Glazier, M.D.  Division of [place]

## 2020-11-22 NOTE — Discharge Summary (Signed)
[place] [facility] [place] NAME: Courtney Wilcox    MR#:  [id number]  [place] OF BIRTH:  1946-01-09  DATE OF ADMISSION:  11/19/2020 ADMITTING PHYSICIAN: Fritzi Mandes, MD  DATE OF DISCHARGE: 11/22/2020  PRIMARY CARE PHYSICIAN: Marinda Elk, MD    ADMISSION DIAGNOSIS:  Sinus tachycardia [R00.0] SVT (supraventricular tachycardia) (HCC) [I47.1] PE (pulmonary thromboembolism) ([place]) [I26.99] Acute pulmonary embolism (HCC) [I26.99] Acute pulmonary embolism, unspecified pulmonary embolism type, unspecified whether acute cor pulmonale present ([place]) [I26.99]  DISCHARGE DIAGNOSIS:  acute pulmonary embolism, bilateral sub segmental atrial fibrillation acute on chronic  SECONDARY DIAGNOSIS:   Past Medical History:  Diagnosis Date   Allergic rhinitis    Anemia    Anxiety    Arthritis    Asthma    Atrophic vaginitis    B12 deficiency    Bronchitis, chronic (HCC)    Cervical disc disease    Collagen vascular disease (HCC)    RA   Depression    Fibrocystic breast disease    Foot ulcer (HCC)    GERD (gastroesophageal reflux disease)    Hammer toe    Hyperlipidemia    IBS (irritable bowel syndrome)    Obesity    Obstructive sleep apnea    OSA (obstructive sleep apnea)    Osteoarthrosis    Multi site   Osteopenia    Rheumatoid arthritis (HCC)    Shingles    Thyroid nodule    MULTIPLE   Tonsillitis    recurrent   Umbilical hernia     HOSPITAL COURSE:   75 year old female with a history of rheumatoid arthritis, hyperlipidemia, asthma presents to the ER today from home.  She was seen yesterday by her pulmonologist.  She has been having increasing shortness of breath.  D-dimer was positive as an outpatient.  Patient underwent CTPA today which demonstrated bilateral subsegmental PE.  Sent to the ER for evaluation.   Patient states that she has been having more difficulty breathing of the last week.  Her inhalers have not been working.   Patient does have a history of atrial flutter.  She is not on any anticoagulation.   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 Risk and benefits of blood thinners discussed   Arrhythmia-- SVT versus flutter fib -- patient has history of atrial flutter/fibrillation per old records -- will resume metoprolol 25 BID -- try wean her off diltiazem drip -- currently on eliquis for PE should be adequate for anticoagulation -- cardiology consultation with Dr. Humphrey Rolls (covering for [facility]) --will add cardizem po today and wean her off the gtt --11/7--switched to po cardizem 60 mg bid and BB 25 mg bd--HR 49--70 with few NSVT --pt and dters preference was to get [facility] cardiology to follow up as out pt. Pt has appt to see dr Quentin Ore 12/15/20   rheumatoid arthritis -- patient on plaquenil and Arava   history of asthma -- continue bronchodilators -- sats 99-100% on room air   Chronic anxiety -- continue PRN Xanax, continue Celexa, discussed with patient regarding mindfulness, meditation and listening to music hopefully to help her with her anxiety.   Procedures: Family communication : daughter at bedside  consults :[facility] cards CODE STATUS: full DVT Prophylaxis :Eliquis Level of care: Progressive Cardiac Status is: Inpatient   Discharge plan  was d/w pt and dter CONSULTS OBTAINED:  Treatment Team:  Yolonda Kida, MD Ottie Glazier, MD  DRUG ALLERGIES:   Allergies  Allergen Reactions   Lactose Other (See Comments) and Diarrhea   Other Other (See Comments)    Pt ONLY tolerates SLOW-FE (slow released iron)---upsets IBS   Biaxin [Clarithromycin] Other (See Comments)    GI upset    Hydrocodone-Acetaminophen Itching    Tolerates acetaminophen    Oxycodone Itching   Sulfa Antibiotics Other  (See Comments) and Diarrhea    GI upset  GI upset GI upset    Sulfasalazine Diarrhea    DISCHARGE MEDICATIONS:   Allergies as of 11/22/2020       Reactions   Lactose Other (See Comments), Diarrhea   Other Other (See Comments)   Pt ONLY tolerates SLOW-FE (slow released iron)---upsets IBS   Biaxin [clarithromycin] Other (See Comments)   GI upset   Hydrocodone-acetaminophen Itching   Tolerates acetaminophen    Oxycodone Itching   Sulfa Antibiotics Other (See Comments), Diarrhea   GI upset GI upset GI upset   Sulfasalazine Diarrhea        Medication List     STOP taking these medications    budesonide 0.25 MG/2ML nebulizer solution Commonly known as: PULMICORT   dicyclomine 10 MG capsule Commonly known as: BENTYL   fluconazole 150 MG tablet Commonly known as: DIFLUCAN   ibuprofen 200 MG tablet Commonly known as: ADVIL   meloxicam 7.5 MG tablet Commonly known as: MOBIC   metoprolol succinate 25 MG 24 hr tablet Commonly known as: TOPROL-XL   naproxen sodium 220 MG tablet Commonly known as: ALEVE   Potassium 99 MG Tabs   prednisoLONE acetate 1 % ophthalmic suspension Commonly known as: PRED FORTE       TAKE these medications    acetaminophen 500 MG tablet Commonly known as: TYLENOL Take 1,000 mg by mouth 3 (three) times daily as needed for mild pain.   albuterol 108 (90 Base) MCG/ACT inhaler Commonly known as: VENTOLIN HFA SMARTSIG:2 Inhalation Via Inhaler Every 6 Hours PRN   ALPRAZolam 0.5 MG tablet Commonly known as: XANAX Take by mouth daily as needed.   apixaban 5 MG Tabs tablet Commonly known as: ELIQUIS Take 2 tablets (10 mg total) by mouth 2 (two) times daily. Take 5 mg two times a day from 11/26/2020   budesonide-formoterol 160-4.5 MCG/ACT inhaler Commonly known as: SYMBICORT Inhale 2 puffs into the lungs 2 (two) times daily as needed.   CENTRUM SILVER 50+WOMEN PO Take by mouth.   citalopram 20 MG tablet Commonly known as:  CELEXA Take 20 mg by mouth every evening.   Cyanocobalamin 1000 MCG/ML Kit Inject as directed every 30 (thirty) days.   diltiazem 60 MG tablet Commonly known as: CARDIZEM Take 1 tablet (60 mg total) by mouth every 12 (twelve) hours.   E-Z Spacer inhaler Use as instructed   estradiol 0.1 MG/GM vaginal cream Commonly known as: ESTRACE Insert pea size amount vaginally nightly x 2 weeks, then every other night x 2 weeks, then 2-3 times weekly for maintenance   famotidine 20 MG tablet Commonly known as: PEPCID Take by mouth.   ferrous sulfate 325 (65 FE) MG tablet Take 1 tablet by mouth daily with breakfast.   hydroxychloroquine 200 MG tablet Commonly known as: PLAQUENIL Take 1 tablet (200 mg total) by mouth daily. What changed: when to take this   leflunomide 20 MG tablet Commonly known as: ARAVA Take by mouth.   levalbuterol  1.25 MG/3ML nebulizer solution Commonly known as: XOPENEX Inhale into the lungs.   meclizine 12.5 MG tablet Commonly known as: ANTIVERT PLEASE SEE ATTACHED FOR DETAILED DIRECTIONS   metoprolol tartrate 25 MG tablet Commonly known as: LOPRESSOR Take 1 tablet (25 mg total) by mouth 2 (two) times daily.   montelukast 10 MG tablet Commonly known as: SINGULAIR Take 10 mg by mouth daily as needed (for allergies.).   omeprazole 40 MG capsule Commonly known as: PRILOSEC Take by mouth.   potassium chloride 10 MEQ tablet Commonly known as: KLOR-CON Take by mouth.   predniSONE 10 MG tablet Commonly known as: DELTASONE Take 10 mg by mouth daily.   pseudoephedrine 30 MG tablet Commonly known as: SUDAFED Take 30 mg by mouth every 4 (four) hours as needed for congestion.   Simethicone 180 MG Caps Take 1-2 capsules by mouth as needed.   triamcinolone 55 MCG/ACT Aero nasal inhaler Commonly known as: NASACORT Place 2 sprays into the nose daily.   Vitamin D 50 MCG (2000 UT) tablet Take 4,000 Units by mouth daily.        If you experience  worsening of your admission symptoms, develop shortness of breath, life threatening emergency, suicidal or homicidal thoughts you must seek medical attention immediately by calling 911 or calling your MD immediately  if symptoms less severe.  You Must read complete instructions/literature along with all the possible adverse reactions/side effects for all the Medicines you take and that have been prescribed to you. Take any new Medicines after you have completely understood and accept all the possible adverse reactions/side effects.   Please note  You were cared for by a hospitalist during your hospital stay. If you have any questions about your discharge medications or the care you received while you were in the hospital after you are discharged, you can call the unit and asked to speak with the hospitalist on call if the hospitalist that took care of you is not available. Once you are discharged, your primary care physician will handle any further medical issues. Please note that NO REFILLS for any discharge medications will be authorized once you are discharged, as it is imperative that you return to your primary care physician (or establish a relationship with a primary care physician if you do not have one) for your aftercare needs so that they can reassess your need for medications and monitor your lab values. Today   SUBJECTIVE   Doing overall well. Dter at bedside. Slept better  VITAL SIGNS:  Blood pressure (!) 141/70, pulse (!) 59, temperature 98.3 F (36.8 C), temperature source Oral, resp. rate 18, height $RemoveBe'5\' 2"'jvBnlCkeR$  (1.575 m), weight 84.7 kg, SpO2 96 %.  I/O:   Intake/Output Summary (Last 24 hours) at 11/22/2020 1348 Last data filed at 11/22/2020 1023 Gross per 24 hour  Intake 444.96 ml  Output --  Net 444.96 ml    PHYSICAL EXAMINATION:  GENERAL:  75 y.o.-year-old patient lying in the bed with no acute distress.  LUNGS: Normal breath sounds bilaterally, no wheezing, rales,rhonchi or  crepitation. No use of accessory muscles of respiration.  CARDIOVASCULAR: S1, S2 normal. No murmurs, rubs, or gallops.  ABDOMEN: Soft, non-tender, non-distended. Bowel sounds present. No organomegaly or mass.  EXTREMITIES: No pedal edema, cyanosis, or clubbing.  NEUROLOGIC: non-focal PSYCHIATRIC:  patient is alert and oriented x 3.  SKIN: No obvious rash, lesion, or ulcer.   DATA REVIEW:   CBC  Recent Labs  Lab 11/20/20 0656  WBC 5.3  HGB 10.1*  HCT 32.5*  PLT 123*    Chemistries  Recent Labs  Lab 11/20/20 0656  NA 141  K 3.5  CL 109  CO2 26  GLUCOSE 94  BUN 13  CREATININE 1.03*  CALCIUM 8.3*  MG 2.3  AST 22  ALT 18  ALKPHOS 63  BILITOT 0.8    Microbiology Results   Recent Results (from the past 240 hour(s))  Resp Panel by RT-PCR (Flu A&B, Covid) Nasopharyngeal Swab     Status: None   Collection Time: 11/19/20  6:02 PM   Specimen: Nasopharyngeal Swab; Nasopharyngeal(NP) swabs in vial transport medium  Result Value Ref Range Status   SARS Coronavirus 2 by RT PCR NEGATIVE NEGATIVE Final    Comment: (NOTE) SARS-CoV-2 target nucleic acids are NOT DETECTED.  The SARS-CoV-2 RNA is generally detectable in upper respiratory specimens during the acute phase of infection. The lowest concentration of SARS-CoV-2 viral copies this assay can detect is 138 copies/mL. A negative result does not preclude SARS-Cov-2 infection and should not be used as the sole basis for treatment or other patient management decisions. A negative result may occur with  improper specimen collection/handling, submission of specimen other than nasopharyngeal swab, presence of viral mutation(s) within the areas targeted by this assay, and inadequate number of viral copies(<138 copies/mL). A negative result must be combined with clinical observations, patient history, and epidemiological information. The expected result is Negative.  Fact Sheet for Patients:   EntrepreneurPulse.com.au  Fact Sheet for Healthcare Providers:  IncredibleEmployment.be  This test is no t yet approved or cleared by the [place] FDA and  has been authorized for detection and/or diagnosis of SARS-CoV-2 by FDA under an Emergency Use Authorization (EUA). This EUA will remain  in effect (meaning this test can be used) for the duration of the COVID-19 declaration under Section 564(b)(1) of the Act, 21 U.S.C.section 360bbb-3(b)(1), unless the authorization is terminated  or revoked sooner.       Influenza A by PCR NEGATIVE NEGATIVE Final   Influenza B by PCR NEGATIVE NEGATIVE Final    Comment: (NOTE) The Xpert Xpress SARS-CoV-2/FLU/RSV plus assay is intended as an aid in the diagnosis of influenza from Nasopharyngeal swab specimens and should not be used as a sole basis for treatment. Nasal washings and aspirates are unacceptable for Xpert Xpress SARS-CoV-2/FLU/RSV testing.  Fact Sheet for Patients: EntrepreneurPulse.com.au  Fact Sheet for Healthcare Providers: IncredibleEmployment.be  This test is not yet approved or cleared by the [place] FDA and has been authorized for detection and/or diagnosis of SARS-CoV-2 by FDA under an Emergency Use Authorization (EUA). This EUA will remain in effect (meaning this test can be used) for the duration of the COVID-19 declaration under Section 564(b)(1) of the Act, 21 U.S.C. section 360bbb-3(b)(1), unless the authorization is terminated or revoked.  Performed at [facility], [address] [place], [place] [address]     RADIOLOGY:  No results found.   CODE STATUS:     Code Status Orders  (From admission, onward)           Start     Ordered   11/19/20 2317  Full code  Continuous        11/19/20 2316           Code Status History     Date Active Date Inactive Code Status Order ID Comments User Context    11/19/2020 1848 11/19/2020 2316 Full Code [id number]  Kristopher Oppenheim, DO ED   01/31/2017  1626 02/03/2017 1621 Full Code [id number]  Dereck Leep, MD Inpatient   07/12/2016 1659 07/14/2016 1549 Full Code [id number]  Dereck Leep, MD Inpatient      Advance Directive Documentation    Flowsheet Row Most Recent Value  Type of Advance Directive Living will  Pre-existing out of facility DNR order (yellow form or pink MOST form) --  "MOST" Form in Place? --        TOTAL TIME TAKING CARE OF THIS PATIENT: 35 minutes.    Fritzi Mandes M.D  [facility]    CC: Primary care physician; Marinda Elk, MD

## 2020-11-22 NOTE — Plan of Care (Signed)

## 2020-11-22 NOTE — Progress Notes (Signed)
SUBJECTIVE: No chest pain   Vitals:   11/21/20 2000 11/22/20 0000 11/22/20 0400 11/22/20 0500  BP: (!) 141/77 137/83 (!) 160/91   Pulse: 62  (!) 54   Resp: 18 20 20    Temp: 98.1 F (36.7 C) 98.2 F (36.8 C) 98.4 F (36.9 C)   TempSrc:      SpO2: 98% 96% 100%   Weight:    84.7 kg  Height:        Intake/Output Summary (Last 24 hours) at 11/22/2020 0738 Last data filed at 11/21/2020 2000 Gross per 24 hour  Intake 204.96 ml  Output --  Net 204.96 ml    LABS: Basic Metabolic Panel: Recent Labs    11/19/20 1802 11/20/20 0656  NA 141 141  K 3.7 3.5  CL 107 109  CO2 25 26  GLUCOSE 114* 94  BUN 13 13  CREATININE 1.21* 1.03*  CALCIUM 8.8* 8.3*  MG  --  2.3   Liver Function Tests: Recent Labs    11/19/20 1802 11/20/20 0656  AST 23 22  ALT 21 18  ALKPHOS 84 63  BILITOT 0.8 0.8  PROT 6.8 5.5*  ALBUMIN 3.6 3.2*   No results for input(s): LIPASE, AMYLASE in the last 72 hours. CBC: Recent Labs    11/19/20 1802 11/20/20 0656  WBC 6.5 5.3  NEUTROABS 5.6 2.9  HGB 12.0 10.1*  HCT 36.2 32.5*  MCV 99.5 100.3*  PLT 139* 123*   Cardiac Enzymes: No results for input(s): CKTOTAL, CKMB, CKMBINDEX, TROPONINI in the last 72 hours. BNP: Invalid input(s): POCBNP D-Dimer: No results for input(s): DDIMER in the last 72 hours. Hemoglobin A1C: No results for input(s): HGBA1C in the last 72 hours. Fasting Lipid Panel: No results for input(s): CHOL, HDL, LDLCALC, TRIG, CHOLHDL, LDLDIRECT in the last 72 hours. Thyroid Function Tests: No results for input(s): TSH, T4TOTAL, T3FREE, THYROIDAB in the last 72 hours.  Invalid input(s): FREET3 Anemia Panel: No results for input(s): VITAMINB12, FOLATE, FERRITIN, TIBC, IRON, RETICCTPCT in the last 72 hours.   PHYSICAL EXAM General: Well developed, well nourished, in no acute distress HEENT:  Normocephalic and atramatic Neck:  No JVD.  Lungs: Clear bilaterally to auscultation and percussion. Heart: HRRR . Normal S1 and S2  without gallops or murmurs.  Abdomen: Bowel sounds are positive, abdomen soft and non-tender  Msk:  Back normal, normal gait. Normal strength and tone for age. Extremities: No clubbing, cyanosis or edema.   Neuro: Alert and oriented X 3. Psych:  Good affect, responds appropriately  TELEMETRY: Sinus rhythm  ASSESSMENT AND PLAN: Status post paroxysmal atrial fibrillation/sinus tachycardia in the setting of pulmonary embolism.  Patient is on Cardizem and doing very well no chest pain or palpitation.  Cardiac point of view.  10 patient can be discharged with follow-up in the office next week on Monday at 9 AM.  Principal Problem:   SVT (supraventricular tachycardia) (HCC) Active Problems:   Rheumatoid arthritis (HCC)   Hyperlipidemia   Acute pulmonary embolism (HCC)   History of atrial flutter   PE (pulmonary thromboembolism) (HCC)    Saturday A, MD, [facility] 11/22/2020 7:38 AM

## 2020-12-08 ENCOUNTER — Telehealth: Payer: Self-pay | Admitting: Oncology

## 2020-12-08 NOTE — Telephone Encounter (Signed)
Pt called to cancel her appt. Needs to reschedule. Please call back at [phone]

## 2020-12-13 ENCOUNTER — Inpatient Hospital Stay: Payer: Medicare Other

## 2020-12-13 ENCOUNTER — Telehealth: Payer: Self-pay | Admitting: Oncology

## 2020-12-13 ENCOUNTER — Inpatient Hospital Stay: Payer: Medicare Other | Admitting: Oncology

## 2020-12-13 NOTE — Telephone Encounter (Signed)
Daughter called to cancel appt. Again for today. Please call her at [phone]

## 2020-12-15 ENCOUNTER — Institutional Professional Consult (permissible substitution): Payer: Medicare Other | Admitting: Cardiology

## 2020-12-16 ENCOUNTER — Other Ambulatory Visit
Admission: RE | Admit: 2020-12-16 | Discharge: 2020-12-16 | Disposition: A | Discharge status: Home / Self Care | Payer: Medicare Other | Source: Ambulatory Visit | Attending: Cardiology | Admitting: Cardiology

## 2020-12-16 DIAGNOSIS — I2699 Other pulmonary embolism without acute cor pulmonale: Secondary | ICD-10-CM | POA: Insufficient documentation

## 2020-12-16 DIAGNOSIS — I471 Supraventricular tachycardia: Secondary | ICD-10-CM | POA: Insufficient documentation

## 2020-12-16 DIAGNOSIS — R0602 Shortness of breath: Secondary | ICD-10-CM | POA: Diagnosis not present

## 2020-12-16 LAB — BRAIN NATRIURETIC PEPTIDE: B Natriuretic Peptide: 483.9 pg/mL — ABNORMAL HIGH (ref 0.0–100.0)

## 2020-12-29 ENCOUNTER — Other Ambulatory Visit: Payer: Self-pay | Admitting: Physician Assistant

## 2020-12-29 DIAGNOSIS — Z1231 Encounter for screening mammogram for malignant neoplasm of breast: Secondary | ICD-10-CM

## 2021-01-16 DIAGNOSIS — H3530 Unspecified macular degeneration: Secondary | ICD-10-CM

## 2021-01-16 HISTORY — DX: Unspecified macular degeneration: H35.30

## 2021-02-09 ENCOUNTER — Institutional Professional Consult (permissible substitution): Payer: Medicare Other | Admitting: Cardiology

## 2021-03-09 ENCOUNTER — Institutional Professional Consult (permissible substitution): Payer: Medicare Other | Admitting: Cardiology

## 2021-05-04 ENCOUNTER — Other Ambulatory Visit
Admission: RE | Admit: 2021-05-04 | Discharge: 2021-05-04 | Disposition: A | Discharge status: Home / Self Care | Payer: Medicare Other | Source: Ambulatory Visit | Attending: Cardiology | Admitting: Cardiology

## 2021-05-04 DIAGNOSIS — R0602 Shortness of breath: Secondary | ICD-10-CM | POA: Insufficient documentation

## 2021-05-04 LAB — BRAIN NATRIURETIC PEPTIDE: B Natriuretic Peptide: 590.5 pg/mL — ABNORMAL HIGH (ref 0.0–100.0)

## 2021-05-16 ENCOUNTER — Encounter: Admission: RE | Payer: Self-pay | Source: Home / Self Care

## 2021-05-16 ENCOUNTER — Ambulatory Visit: Admission: RE | Admit: 2021-05-16 | Payer: Medicare Other | Source: Home / Self Care

## 2021-05-16 SURGERY — ESOPHAGOGASTRODUODENOSCOPY (EGD) WITH PROPOFOL
Anesthesia: General

## 2021-07-13 ENCOUNTER — Ambulatory Visit (INDEPENDENT_AMBULATORY_CARE_PROVIDER_SITE_OTHER): Payer: Medicare Other | Admitting: Cardiology

## 2021-07-13 ENCOUNTER — Encounter: Payer: Self-pay | Admitting: Cardiology

## 2021-07-13 ENCOUNTER — Other Ambulatory Visit
Admission: RE | Admit: 2021-07-13 | Discharge: 2021-07-13 | Disposition: A | Discharge status: Home / Self Care | Payer: Medicare Other | Attending: Cardiology | Admitting: Cardiology

## 2021-07-13 VITALS — BP 126/76 | HR 55 | Ht 62.0 in | Wt 195.0 lb

## 2021-07-13 DIAGNOSIS — R0602 Shortness of breath: Secondary | ICD-10-CM | POA: Diagnosis not present

## 2021-07-13 DIAGNOSIS — I471 Supraventricular tachycardia: Secondary | ICD-10-CM | POA: Insufficient documentation

## 2021-07-13 DIAGNOSIS — I483 Typical atrial flutter: Secondary | ICD-10-CM | POA: Insufficient documentation

## 2021-07-13 LAB — BASIC METABOLIC PANEL
Anion gap: 9 (ref 5–15)
BUN: 18 mg/dL (ref 8–23)
CO2: 31 mmol/L (ref 22–32)
Calcium: 8.9 mg/dL (ref 8.9–10.3)
Chloride: 106 mmol/L (ref 98–111)
Creatinine, Ser: 1.55 mg/dL — ABNORMAL HIGH (ref 0.44–1.00)
GFR, Estimated: 35 mL/min — ABNORMAL LOW (ref >60)
Glucose, Bld: 88 mg/dL (ref 70–99)
Potassium: 3 mmol/L — ABNORMAL LOW (ref 3.5–5.1)
Sodium: 146 mmol/L — ABNORMAL HIGH (ref 135–145)

## 2021-07-13 LAB — MAGNESIUM: Magnesium: 1.9 mg/dL (ref 1.7–2.4)

## 2021-07-13 LAB — BRAIN NATRIURETIC PEPTIDE: B Natriuretic Peptide: 239.1 pg/mL — ABNORMAL HIGH (ref 0.0–100.0)

## 2021-07-13 MED ORDER — METOPROLOL SUCCINATE ER 50 MG PO TB24: 75.0000 mg | ORAL_TABLET | Freq: Every day | ORAL | 3 refills | Status: DC

## 2021-07-13 NOTE — Patient Instructions (Addendum)
Medications: Stop Metoprolol Tartrate Start Metoprolol Succinate 75 mg two times a day. (1.5 tablets)  Your physician recommends that you continue on your current medications as directed. Please refer to the Current Medication list given to you today. *If you need a refill on your cardiac medications before your next appointment, please call your pharmacy*  Lab Work: BMP, MAG, BNP If you have labs (blood work) drawn today and your tests are completely normal, you will receive your results only by: MyChart Message (if you have MyChart) OR A paper copy in the mail If you have any lab test that is abnormal or we need to change your treatment, we will call you to review the results.  Testing/Procedures: None.  Follow-Up: At [facility], you and your health needs are our priority.  As part of our continuing mission to provide you with exceptional heart care, we have created designated Provider Care Teams.  These Care Teams include your primary Cardiologist (physician) and Advanced Practice Providers (APPs -  Physician Assistants and Nurse Practitioners) who all work together to provide you with the care you need, when you need it.  Your physician wants you to follow-up in: 6 weeks with Steffanie Dunn, MD  We recommend signing up for the patient portal called "MyChart".  Sign up information is provided on this After Visit Summary.  MyChart is used to connect with patients for Virtual Visits (Telemedicine).  Patients are able to view lab/test results, encounter notes, upcoming appointments, etc.  Non-urgent messages can be sent to your provider as well.   To learn more about what you can do with MyChart, go to [email or web address].    Any Other Special Instructions Will Be Listed Below (If Applicable).

## 2021-07-13 NOTE — Progress Notes (Signed)
Electrophysiology Office Note:    Date:  07/13/2021   ID:  Courtney Wilcox, DOB 05-29-1945, MRN [id number]  PCP:  Marinda Elk, MD  [facility] Cardiologist:  None  [facility] Electrophysiologist:  None   Referring MD: Marinda Elk, MD and Dr Corky Sox  Chief Complaint: SVT  History of Present Illness:    Courtney Wilcox is a 76 y.o. female who presents for an evaluation of SVT at the request of Dr Corky Sox. Their medical history includes OSA, obesity, atypical chest pain and PE. She last saw Dr Corky Sox 06/06/2021 for SVT. She reports chronic dyspnea. She is with her daughter today in clinic. She reports dyspnea that occurs with minimal exertion. Today she used a wheelchair to get to clinic.     Past Medical History:  Diagnosis Date   Allergic rhinitis    Anemia    Anxiety    Arthritis    Asthma    Atrophic vaginitis    B12 deficiency    Bronchitis, chronic (HCC)    Cervical disc disease    Collagen vascular disease (HCC)    RA   Depression    Fibrocystic breast disease    Foot ulcer (HCC)    GERD (gastroesophageal reflux disease)    Hammer toe    Hyperlipidemia    IBS (irritable bowel syndrome)    Obesity    Obstructive sleep apnea    OSA (obstructive sleep apnea)    Osteoarthrosis    Multi site   Osteopenia    Rheumatoid arthritis ([place])    Shingles    Thyroid nodule    MULTIPLE   Tonsillitis    recurrent   Umbilical hernia     Past Surgical History:  Procedure Laterality Date   BACK SURGERY  2011   LAMINECTOMY   BREAST BIOPSY Right    neg   CERVICAL FUSION  2011, 2012   X 2   COLONOSCOPY  2005, 2015   COLONOSCOPY WITH PROPOFOL N/A 08/04/2020   Procedure: COLONOSCOPY WITH PROPOFOL;  Surgeon: Robert Bellow, MD;  Location: [facility] ENDOSCOPY;  Service: Endoscopy;  Laterality: N/A;   ESOPHAGOGASTRODUODENOSCOPY ENDOSCOPY  2005, 2013, 2015   EYE SURGERY Bilateral    Cataract Extraction with IOL    HERNIA REPAIR     JOINT REPLACEMENT      KNEE ARTHROPLASTY Left 07/12/2016   Procedure: COMPUTER ASSISTED TOTAL KNEE ARTHROPLASTY;  Surgeon: Dereck Leep, MD;  Location: [facility] ORS;  Service: Orthopedics;  Laterality: Left;   Laryngeal tear after intubation w/repair     LUMBAR DISC SURGERY  12/2009   REPLACEMENT TOTAL KNEE Right 06/2007   DR. HOOTEN, [facility]   REPLACEMENT UNICONDYLAR JOINT KNEE     DR. HOOTEN, [facility]   SHOULDER ARTHROSCOPY WITH ROTATOR CUFF REPAIR Right    TONSILLECTOMY     TOTAL HIP ARTHROPLASTY Right 01/31/2017   Procedure: TOTAL HIP ARTHROPLASTY;  Surgeon: Dereck Leep, MD;  Location: [facility] ORS;  Service: Orthopedics;  Laterality: Right;   TUBAL LIGATION     UPPER GI ENDOSCOPY      Current Medications: Current Meds  Medication Sig   acetaminophen (TYLENOL) 500 MG tablet Take 1,000 mg by mouth 3 (three) times daily as needed for mild pain.    ALPRAZolam (XANAX) 0.5 MG tablet Take by mouth daily as needed.   apixaban (ELIQUIS) 5 MG TABS tablet Take 2 tablets (10 mg total) by mouth 2 (two) times daily. Take 5 mg two times a day  from 11/26/2020   budesonide-formoterol (SYMBICORT) 160-4.5 MCG/ACT inhaler Inhale 2 puffs into the lungs 2 (two) times daily as needed.   Cholecalciferol (VITAMIN D) 2000 units tablet Take 4,000 Units by mouth daily.    citalopram (CELEXA) 20 MG tablet Take 20 mg by mouth every evening.    Cyanocobalamin 1000 MCG/ML KIT Inject as directed every 30 (thirty) days.   estradiol (ESTRACE) 0.1 MG/GM vaginal cream Insert pea size amount vaginally nightly x 2 weeks, then every other night x 2 weeks, then 2-3 times weekly for maintenance   famotidine (PEPCID) 20 MG tablet Take by mouth.   furosemide (LASIX) 40 MG tablet Take 40 mg by mouth daily.   hydroxychloroquine (PLAQUENIL) 200 MG tablet Take 1 tablet (200 mg total) by mouth daily.   leflunomide (ARAVA) 20 MG tablet Take by mouth.   levalbuterol (XOPENEX) 0.63 MG/3ML nebulizer solution as needed for wheezing.   meclizine (ANTIVERT) 12.5  MG tablet PLEASE SEE ATTACHED FOR DETAILED DIRECTIONS   metoprolol succinate (TOPROL-XL) 50 MG 24 hr tablet Take 1.5 tablets (75 mg total) by mouth daily. Take with or immediately following a meal.   montelukast (SINGULAIR) 10 MG tablet Take 10 mg by mouth daily as needed (for allergies.).    Multiple Vitamins-Minerals (CENTRUM SILVER 50+WOMEN PO) Take by mouth.   nystatin (MYCOSTATIN) 100000 UNIT/ML suspension Take 5 mLs by mouth as needed.   pantoprazole (PROTONIX) 40 MG tablet Take 40 mg by mouth 2 (two) times daily.   predniSONE (DELTASONE) 10 MG tablet Take 10 mg by mouth daily.   Simethicone 180 MG CAPS Take 1-2 capsules by mouth as needed.   triamcinolone (NASACORT) 55 MCG/ACT AERO nasal inhaler Place 2 sprays into the nose daily.   [DISCONTINUED] albuterol (VENTOLIN HFA) 108 (90 Base) MCG/ACT inhaler SMARTSIG:2 Inhalation Via Inhaler Every 6 Hours PRN   [DISCONTINUED] budesonide (PULMICORT) 0.25 MG/2ML nebulizer solution Take 2 mLs by nebulization 2 (two) times daily.   [DISCONTINUED] diltiazem (CARDIZEM) 60 MG tablet Take 1 tablet (60 mg total) by mouth every 12 (twelve) hours.   [DISCONTINUED] metoprolol tartrate (LOPRESSOR) 25 MG tablet Take 1 tablet (25 mg total) by mouth 2 (two) times daily. (Patient taking differently: Take 50 mg by mouth 2 (two) times daily.)   [DISCONTINUED] omeprazole (PRILOSEC) 40 MG capsule Take by mouth.   [DISCONTINUED] potassium chloride (KLOR-CON) 10 MEQ tablet Take by mouth.   [DISCONTINUED] pseudoephedrine (SUDAFED) 30 MG tablet Take 30 mg by mouth every 4 (four) hours as needed for congestion.   [DISCONTINUED] Spacer/Aero-Holding Chambers (E-Z SPACER) inhaler Use as instructed     Allergies:   Lactose, Other, Biaxin [clarithromycin], Hydrocodone-acetaminophen, Oxycodone, Sulfa antibiotics, and Sulfasalazine   Social History   Socioeconomic History   Marital status: Married    Spouse name: Not on file   Number of children: Not on file   Years of  education: Not on file   Highest education level: Not on file  Occupational History   Not on file  Tobacco Use   Smoking status: Never   Smokeless tobacco: Never  Vaping Use   Vaping Use: Never used  Substance and Sexual Activity   Alcohol use: No   Drug use: No   Sexual activity: Not on file  Other Topics Concern   Not on file  Social History Narrative   Not on file   Social Determinants of Health   Financial Resource Strain: Not on file  Food Insecurity: Not on file  Transportation Needs: Not on file  Physical Activity: Not on file  Stress: Not on file  Social Connections: Not on file     Family History: The patient's family history includes Asthma in her mother; Breast cancer (age of onset: 55) in her mother; Emphysema in her father; Heart disease in her father; Lung cancer in her father.  ROS:   Please see the history of present illness.    All other systems reviewed and are negative.  EKGs/Labs/Other Studies Reviewed:    The following studies were reviewed today:  11/20/2020 Echo EF 45-50% RV mildly reduced Severely dilated LA Moderately dilated RA Mild-mod MR Mild-mod TR   EKG:  The ekg ordered today demonstrates sinus bradycardia with PAC. PR 1109m. QRS duration 1067m   Recent Labs: 08/09/2020: TSH 1.628 11/20/2020: ALT 18; Hemoglobin 10.1; Platelets 123 07/13/2021: B Natriuretic Peptide 239.1; BUN 18; Creatinine, Ser 1.55; Magnesium 1.9; Potassium 3.0; Sodium 146  Recent Lipid Panel No results found for: "CHOL", "TRIG", "HDL", "CHOLHDL", "VLDL", "LDLCALC", "LDLDIRECT"  Physical Exam:    VS:  BP 126/76   Pulse (!) 55   Ht '5\' 2"'  (1.575 m)   Wt 195 lb (88.5 kg)   SpO2 97%   BMI 35.67 kg/m     Wt Readings from Last 3 Encounters:  07/13/21 195 lb (88.5 kg)  11/22/20 186 lb 12.8 oz (84.7 kg)  08/12/20 191 lb 6.4 oz (86.8 kg)     GEN:  Well nourished, well developed in mild distress 2/2 dyspnea HEENT: Normal NECK: No JVD; No carotid  bruits LYMPHATICS: No lymphadenopathy CARDIAC: RRR, no murmurs, rubs, gallops RESPIRATORY:  Clear to auscultation without rales, wheezing or rhonchi  ABDOMEN: Soft, non-tender, non-distended MUSCULOSKELETAL:  No edema; No deformity  SKIN: Warm and dry NEUROLOGIC:  Alert and oriented x 3 PSYCHIATRIC:  Normal affect       ASSESSMENT:    1. SVT (supraventricular tachycardia) (HCPike  2. Shortness of breath    PLAN:    In order of problems listed above:  #SVT Likely AT. Today doing well without AT and still feeling very dyspneic. I do not think her SVT is driving her symptoms. I do suspect that the SVT is 2/2 to her severe lung disease. For now, I have recommended changing from short to long acting metoprolol.   The next step in management would be to add a Class IC agent such as flecainide or propafenone. This would require her to transition off Citalopram. I have asked the patient to reach out to her PCP to discuss this transition. I would recommend changing to a SNRI which are better tolerated alongside Class IC agents.  #Dyspnea I suspect her dyspnea is mainly driven by her lung disease. While the arrhythmias could exacerbate the dyspnea at times, it is not the main driver. Recommend continued pulmonary evaluation/treatment.  I will plan to check blood work today and have the patient return in 6 weeks to discuss possible addition of antiarrhythmic.   Medication Adjustments/Labs and Tests Ordered: Current medicines are reviewed at length with the patient today.  Concerns regarding medicines are outlined above.  Orders Placed This Encounter  Procedures   Basic Metabolic Panel (BMET)   Magnesium   B Nat Peptide   EKG 12-Lead   Meds ordered this encounter  Medications   metoprolol succinate (TOPROL-XL) 50 MG 24 hr tablet    Sig: Take 1.5 tablets (75 mg total) by mouth daily. Take with or immediately following a meal.    Dispense:  135 tablet  Refill:  3      Signed, Lysbeth Galas T. Quentin Ore, MD, [facility], [facility] 07/13/2021 6:16 PM    Electrophysiology [facility]

## 2021-07-15 ENCOUNTER — Telehealth: Payer: Self-pay | Admitting: Cardiology

## 2021-07-15 ENCOUNTER — Telehealth: Payer: Self-pay | Admitting: *Deleted

## 2021-07-15 MED ORDER — POTASSIUM CHLORIDE CRYS ER 20 MEQ PO TBCR: 40.0000 meq | EXTENDED_RELEASE_TABLET | ORAL | 3 refills | Status: DC

## 2021-07-15 MED ORDER — FUROSEMIDE 40 MG PO TABS: 40.0000 mg | ORAL_TABLET | ORAL | 3 refills | Status: DC

## 2021-07-15 MED ORDER — METOPROLOL SUCCINATE ER 50 MG PO TB24: 75.0000 mg | ORAL_TABLET | Freq: Two times a day (BID) | ORAL | 3 refills | Status: DC

## 2021-07-15 NOTE — Telephone Encounter (Signed)
-----   Message from Lanier Prude, MD sent at 07/14/2021  8:18 AM EDT ----- Labs reviewed.  I recommend we decrease the lasix to every other day. On days she takes the lasix, she needs to also take potassium. Please send a prescription for Kdur once daily on days she takes lasix.  Thanks!  Sheria Lang T. Lalla Brothers, MD, [facility], [facility] Cardiac Electrophysiology

## 2021-07-15 NOTE — Addendum Note (Signed)
Addended by: Sampson Goon on: 07/15/2021 04:27 PM   Modules accepted: Orders

## 2021-07-15 NOTE — Telephone Encounter (Signed)
Pt c/o medication issue:  1. Name of Medication: metoprolol succinate (TOPROL-XL) 50 MG 24 hr tablet  2. How are you currently taking this medication (dosage and times per day)? As written  3. Are you having a reaction (difficulty breathing--STAT)? no  4. What is your medication issue? Pt states that she was told to take medication twice a day but prescription and pharmacy says once daily. She just needs clarification. Please advise.

## 2021-07-15 NOTE — Telephone Encounter (Signed)
Adjusted prescriptions.

## 2021-08-11 ENCOUNTER — Encounter (INDEPENDENT_AMBULATORY_CARE_PROVIDER_SITE_OTHER): Payer: Medicare Other

## 2021-08-11 ENCOUNTER — Ambulatory Visit (INDEPENDENT_AMBULATORY_CARE_PROVIDER_SITE_OTHER): Payer: Medicare Other | Admitting: Vascular Surgery

## 2021-08-15 ENCOUNTER — Telehealth: Payer: Self-pay | Admitting: Cardiology

## 2021-08-15 NOTE — Telephone Encounter (Signed)
Incoming call number keeps ringing busy. Left voicemail to call back on listed number.

## 2021-08-15 NOTE — Telephone Encounter (Signed)
Discussed the blood work that Dr. Lalla Brothers got in June and reduced her lasix because of her kidney function. Patient since had lab with PCP and he has put it on hold because of kidney function, recommending she see a Nephrologist. Talked to the patient about her general cardiologist Dr. Beatrix Fetters and they said he had retired and they are not sure who she is going to follow with now. They will try and get an new gen cards at [facility] and if not will see if Dr. Lalla Brothers can refer to one in our practice.  Will remove put Lasix on hold in patients chart.

## 2021-08-15 NOTE — Telephone Encounter (Signed)
Daughter called in to say that meds that dr Lalla Brothers has on her could be effecting a kidney. Daughter would like to speak with the dr about it. Please advise

## 2021-08-24 ENCOUNTER — Other Ambulatory Visit
Admission: RE | Admit: 2021-08-24 | Discharge: 2021-08-24 | Disposition: A | Discharge status: Home / Self Care | Payer: Medicare Other | Source: Ambulatory Visit | Attending: Physician Assistant | Admitting: Physician Assistant

## 2021-08-24 DIAGNOSIS — N1832 Chronic kidney disease, stage 3b: Secondary | ICD-10-CM | POA: Insufficient documentation

## 2021-08-24 DIAGNOSIS — R0602 Shortness of breath: Secondary | ICD-10-CM | POA: Insufficient documentation

## 2021-08-24 LAB — BRAIN NATRIURETIC PEPTIDE: B Natriuretic Peptide: 181.1 pg/mL — ABNORMAL HIGH (ref 0.0–100.0)

## 2021-08-26 ENCOUNTER — Other Ambulatory Visit: Payer: Self-pay | Admitting: Physician Assistant

## 2021-08-26 DIAGNOSIS — N1832 Chronic kidney disease, stage 3b: Secondary | ICD-10-CM

## 2021-08-30 ENCOUNTER — Ambulatory Visit
Admission: RE | Admit: 2021-08-30 | Discharge: 2021-08-30 | Disposition: A | Discharge status: Home / Self Care | Payer: Medicare Other | Source: Ambulatory Visit | Attending: Physician Assistant | Admitting: Physician Assistant

## 2021-08-30 DIAGNOSIS — N1832 Chronic kidney disease, stage 3b: Secondary | ICD-10-CM

## 2021-09-15 DIAGNOSIS — I119 Hypertensive heart disease without heart failure: Secondary | ICD-10-CM | POA: Insufficient documentation

## 2021-09-21 ENCOUNTER — Encounter: Payer: Self-pay | Admitting: Cardiology

## 2021-09-21 ENCOUNTER — Ambulatory Visit: Payer: Medicare Other | Attending: Cardiology | Admitting: Cardiology

## 2021-09-21 VITALS — BP 140/66 | HR 70 | Ht 62.0 in | Wt 190.2 lb

## 2021-09-21 DIAGNOSIS — I471 Supraventricular tachycardia: Secondary | ICD-10-CM | POA: Diagnosis not present

## 2021-09-21 DIAGNOSIS — I5022 Chronic systolic (congestive) heart failure: Secondary | ICD-10-CM | POA: Insufficient documentation

## 2021-09-21 DIAGNOSIS — R0602 Shortness of breath: Secondary | ICD-10-CM | POA: Diagnosis not present

## 2021-09-21 MED ORDER — METOPROLOL SUCCINATE ER 50 MG PO TB24: 50.0000 mg | ORAL_TABLET | Freq: Two times a day (BID) | ORAL | 3 refills | Status: DC

## 2021-09-21 NOTE — Patient Instructions (Signed)
Medication Instructions:  Reduce Metoprolol Succinate to 50 mg two times a day  *If you need a refill on your cardiac medications before your next appointment, please call your pharmacy*   Lab Work: none If you have labs (blood work) drawn today and your tests are completely normal, you will receive your results only by: MyChart Message (if you have MyChart) OR A paper copy in the mail If you have any lab test that is abnormal or we need to change your treatment, we will call you to review the results.   Testing/Procedures: Your physician has requested that you have an echocardiogram. Echocardiography is a painless test that uses sound waves to create images of your heart. It provides your doctor with information about the size and shape of your heart and how well your heart's chambers and valves are working. This procedure takes approximately one hour. There are no restrictions for this procedure.    Follow-Up: At [facility], you and your health needs are our priority.  As part of our continuing mission to provide you with exceptional heart care, we have created designated Provider Care Teams.  These Care Teams include your primary Cardiologist (physician) and Advanced Practice Providers (APPs -  Physician Assistants and Nurse Practitioners) who all work together to provide you with the care you need, when you need it.  We recommend signing up for the patient portal called "MyChart".  Sign up information is provided on this After Visit Summary.  MyChart is used to connect with patients for Virtual Visits (Telemedicine).  Patients are able to view lab/test results, encounter notes, upcoming appointments, etc.  Non-urgent messages can be sent to your provider as well.   To learn more about what you can do with MyChart, go to [email or web address].    Your next appointment:   Next available with Dr. Lalla Brothers for a loop implant. - MDT device  The format for your next appointment:    In Person  Provider:   Steffanie Dunn, MD    Important Information About Sugar

## 2021-09-21 NOTE — Progress Notes (Signed)
Electrophysiology Office Follow up Visit Note:    Date:  09/21/2021   ID:  Courtney Wilcox, DOB [birth year], MRN [id number]  PCP:  Marinda Elk, MD  [facility] Cardiologist:  None  [facility] Electrophysiologist:  None    Interval History:    Courtney Wilcox is a 76 y.o. female who presents for a follow up visit. They were last seen in clinic July 13, 2021.  She has SVT and was previously seen by Dr. Corky Sox.  She has severe lung disease and I suspected her SVT was secondary to her respiratory difficulties.  She saw Dr. Nehemiah Massed in follow-up September 15, 2021.  The patient also carries a diagnosis of pulmonary embolism and is on Eliquis.  She is in clinic today with her family.  She reports a lot of frustration dealing with her kidney, lung and heart issues.  She has not noticed a significant amount of palpitations recently.  She does report feeling wobbly in her legs and she is concerned that it may be the metoprolol.  She is taking her Lasix on an as-needed basis.  She has an appointment next week with a nephrologist.     Past Medical History:  Diagnosis Date   Allergic rhinitis    Anemia    Anxiety    Arthritis    Asthma    Atrophic vaginitis    B12 deficiency    Bronchitis, chronic ([place])    Cervical disc disease    Collagen vascular disease ([place])    RA   Depression    Fibrocystic breast disease    Foot ulcer ([place])    GERD (gastroesophageal reflux disease)    Hammer toe    Hyperlipidemia    IBS (irritable bowel syndrome)    Obesity    Obstructive sleep apnea    OSA (obstructive sleep apnea)    Osteoarthrosis    Multi site   Osteopenia    Rheumatoid arthritis ([place])    Shingles    Thyroid nodule    MULTIPLE   Tonsillitis    recurrent   Umbilical hernia     Past Surgical History:  Procedure Laterality Date   BACK SURGERY  2011   LAMINECTOMY   BREAST BIOPSY Right    neg   CERVICAL FUSION  2011, 2012   X 2   COLONOSCOPY  2005, 2015    COLONOSCOPY WITH PROPOFOL N/A 08/04/2020   Procedure: COLONOSCOPY WITH PROPOFOL;  Surgeon: Robert Bellow, MD;  Location: [facility] ENDOSCOPY;  Service: Endoscopy;  Laterality: N/A;   ESOPHAGOGASTRODUODENOSCOPY ENDOSCOPY  2005, 2013, 2015   EYE SURGERY Bilateral    Cataract Extraction with IOL    HERNIA REPAIR     JOINT REPLACEMENT     KNEE ARTHROPLASTY Left 07/12/2016   Procedure: COMPUTER ASSISTED TOTAL KNEE ARTHROPLASTY;  Surgeon: Dereck Leep, MD;  Location: [facility] ORS;  Service: Orthopedics;  Laterality: Left;   Laryngeal tear after intubation w/repair     LUMBAR DISC SURGERY  12/2009   REPLACEMENT TOTAL KNEE Right 06/2007   DR. HOOTEN, [facility]   REPLACEMENT UNICONDYLAR JOINT KNEE     DR. HOOTEN, [facility]   SHOULDER ARTHROSCOPY WITH ROTATOR CUFF REPAIR Right    TONSILLECTOMY     TOTAL HIP ARTHROPLASTY Right 01/31/2017   Procedure: TOTAL HIP ARTHROPLASTY;  Surgeon: Dereck Leep, MD;  Location: [facility] ORS;  Service: Orthopedics;  Laterality: Right;   TUBAL LIGATION     UPPER GI ENDOSCOPY  Current Medications: Current Meds  Medication Sig   acetaminophen (TYLENOL) 500 MG tablet Take 1,000 mg by mouth 3 (three) times daily as needed for mild pain.    ALPRAZolam (XANAX) 0.5 MG tablet Take by mouth daily as needed.   apixaban (ELIQUIS) 5 MG TABS tablet Take 2 tablets (10 mg total) by mouth 2 (two) times daily. Take 5 mg two times a day from 11/26/2020   budesonide-formoterol (SYMBICORT) 160-4.5 MCG/ACT inhaler Inhale 2 puffs into the lungs 2 (two) times daily as needed.   Cholecalciferol (VITAMIN D) 2000 units tablet Take 4,000 Units by mouth daily.    citalopram (CELEXA) 20 MG tablet Take 20 mg by mouth every evening.    Cyanocobalamin 1000 MCG/ML KIT Inject as directed every 30 (thirty) days.   estradiol (ESTRACE) 0.1 MG/GM vaginal cream Insert pea size amount vaginally nightly x 2 weeks, then every other night x 2 weeks, then 2-3 times weekly for maintenance   famotidine (PEPCID) 20  MG tablet Take by mouth.   furosemide (LASIX) 40 MG tablet Take 1 tablet (40 mg total) by mouth every other day.   hydroxychloroquine (PLAQUENIL) 200 MG tablet Take 1 tablet (200 mg total) by mouth daily.   leflunomide (ARAVA) 20 MG tablet Take by mouth.   levalbuterol (XOPENEX) 0.63 MG/3ML nebulizer solution as needed for wheezing.   meclizine (ANTIVERT) 12.5 MG tablet PLEASE SEE ATTACHED FOR DETAILED DIRECTIONS   metoprolol succinate (TOPROL-XL) 50 MG 24 hr tablet Take 1 tablet (50 mg total) by mouth 2 (two) times daily after a meal. Take with or immediately following a meal.   montelukast (SINGULAIR) 10 MG tablet Take 10 mg by mouth daily as needed (for allergies.).    Multiple Vitamins-Minerals (CENTRUM SILVER 50+WOMEN PO) Take by mouth.   nystatin (MYCOSTATIN) 100000 UNIT/ML suspension Take 5 mLs by mouth as needed.   pantoprazole (PROTONIX) 40 MG tablet Take 40 mg by mouth 2 (two) times daily.   potassium chloride SA (KLOR-CON M) 20 MEQ tablet Take 2 tablets (40 mEq total) by mouth every other day. With your Lasix.   predniSONE (DELTASONE) 10 MG tablet Take 10 mg by mouth daily.   Simethicone 180 MG CAPS Take 1-2 capsules by mouth as needed.   triamcinolone (NASACORT) 55 MCG/ACT AERO nasal inhaler Place 2 sprays into the nose daily.   [DISCONTINUED] metoprolol succinate (TOPROL-XL) 50 MG 24 hr tablet Take 1.5 tablets (75 mg total) by mouth in the morning and at bedtime. Take with or immediately following a meal.     Allergies:   Lactose, Other, Biaxin [clarithromycin], Hydrocodone-acetaminophen, Oxycodone, Sulfa antibiotics, and Sulfasalazine   Social History   Socioeconomic History   Marital status: Married    Spouse name: Not on file   Number of children: Not on file   Years of education: Not on file   Highest education level: Not on file  Occupational History   Not on file  Tobacco Use   Smoking status: Never   Smokeless tobacco: Never  Vaping Use   Vaping Use: Never used   Substance and Sexual Activity   Alcohol use: No   Drug use: No   Sexual activity: Not on file  Other Topics Concern   Not on file  Social History Narrative   Not on file   Social Determinants of Health   Financial Resource Strain: Not on file  Food Insecurity: Not on file  Transportation Needs: Not on file  Physical Activity: Not on file  Stress: Not  on file  Social Connections: Not on file     Family History: The patient's family history includes Asthma in her mother; Breast cancer (age of onset: 60) in her mother; Emphysema in her father; Heart disease in her father; Lung cancer in her father.  ROS:   Please see the history of present illness.    All other systems reviewed and are negative.  EKGs/Labs/Other Studies Reviewed:    The following studies were reviewed today:      Recent Labs: 11/20/2020: ALT 18; Hemoglobin 10.1; Platelets 123 07/13/2021: BUN 18; Creatinine, Ser 1.55; Magnesium 1.9; Potassium 3.0; Sodium 146 08/24/2021: B Natriuretic Peptide 181.1  Recent Lipid Panel No results found for: "CHOL", "TRIG", "HDL", "CHOLHDL", "VLDL", "LDLCALC", "LDLDIRECT"  Physical Exam:    VS:  BP (!) 140/66   Pulse 70   Ht '5\' 2"'  (1.575 m)   Wt 190 lb 3.2 oz (86.3 kg)   SpO2 96%   BMI 34.79 kg/m     Wt Readings from Last 3 Encounters:  09/21/21 190 lb 3.2 oz (86.3 kg)  07/13/21 195 lb (88.5 kg)  11/22/20 186 lb 12.8 oz (84.7 kg)     GEN: Elderly, obese, no distress sitting at 90 degrees in a chair  HEENT: Normal NECK: No JVD; No carotid bruits LYMPHATICS: No lymphadenopathy CARDIAC: RRR, no murmurs, rubs, gallops RESPIRATORY: Scattered expiratory wheezing bilaterally.  No rhonchi.  No increased work of breathing.   ABDOMEN: Soft, non-tender, non-distended MUSCULOSKELETAL:  No edema; No deformity  SKIN: Warm and dry NEUROLOGIC:  Alert and oriented x 3 PSYCHIATRIC:  Normal affect        ASSESSMENT:    1. SVT (supraventricular tachycardia) ([place])   2.  Shortness of breath   3. Chronic systolic heart failure (HCC)    PLAN:    In order of problems listed above:   #SVT   Most likely AT given ECG history. See above image of termination and initiation of rhythm. Continue metoprolol succinate 75 mg by mouth twice daily.  It is unclear to me right now what the burden of her arrhythmia truly is.  She is unable to tell when she is in or out of atrial tachycardia.  I am concerned that it could be causing worsening diastolic heart failure symptoms and fluid retention when more frequent.  We discussed monitoring strategies during today's appointment and she would like to proceed with loop recorder monitoring.  If it turns out that there is a significant burden of arrhythmia on metoprolol, would favor initiating an antiarrhythmic drug.  Class Ic agents will be limited because of her use of citalopram.  Would likely need to reach for amiodarone.  Decrease the metoprolol to 50 mg by mouth twice daily to see if this helps her wobbly sensation in the legs.  #History of PE Continue Eliquis  #Chronic systolic heart failure NYHA class II-III.  Unclear how much of her symptoms are related to heart disease versus her lung disease.  Her last EF was 45 to 50% in November 2022.  I will repeat her echocardiogram today.  She would like to establish with one of the [facility] general cardiologists.  I will put a referral in.  Follow-up in clinic for loop recorder implant.  We will likely plan to see her back 3 months after loop recorder implant to review data and to adjust treatment plan accordingly.     Total time of encounter: 45 minutes total time of encounter, including face-to-face patient care, coordination of  care and counseling regarding high complexity medical decision making re: AT and HFrEF.  Medication Adjustments/Labs and Tests Ordered: Current medicines are reviewed at length with the patient today.  Concerns regarding medicines are outlined above.   Orders Placed This Encounter  Procedures   Ambulatory referral to Cardiology   ECHOCARDIOGRAM COMPLETE   Meds ordered this encounter  Medications   metoprolol succinate (TOPROL-XL) 50 MG 24 hr tablet    Sig: Take 1 tablet (50 mg total) by mouth 2 (two) times daily after a meal. Take with or immediately following a meal.    Dispense:  180 tablet    Refill:  3     Signed, Lars Mage, MD, [facility], [facility] 09/21/2021 3:25 PM    Electrophysiology [place]

## 2021-09-26 ENCOUNTER — Encounter
Admission: RE | Disposition: A | Discharge status: Home / Self Care | Payer: Self-pay | Source: Home / Self Care | Attending: Gastroenterology

## 2021-09-26 ENCOUNTER — Encounter: Payer: Self-pay | Admitting: Gastroenterology

## 2021-09-26 ENCOUNTER — Ambulatory Visit: Payer: Medicare Other | Admitting: Anesthesiology

## 2021-09-26 ENCOUNTER — Ambulatory Visit
Admission: RE | Admit: 2021-09-26 | Discharge: 2021-09-26 | Disposition: A | Discharge status: Home / Self Care | Payer: Medicare Other | Attending: Gastroenterology | Admitting: Gastroenterology

## 2021-09-26 DIAGNOSIS — J449 Chronic obstructive pulmonary disease, unspecified: Secondary | ICD-10-CM | POA: Insufficient documentation

## 2021-09-26 DIAGNOSIS — Z7952 Long term (current) use of systemic steroids: Secondary | ICD-10-CM | POA: Insufficient documentation

## 2021-09-26 DIAGNOSIS — K219 Gastro-esophageal reflux disease without esophagitis: Secondary | ICD-10-CM | POA: Insufficient documentation

## 2021-09-26 DIAGNOSIS — K449 Diaphragmatic hernia without obstruction or gangrene: Secondary | ICD-10-CM | POA: Diagnosis not present

## 2021-09-26 DIAGNOSIS — Z7951 Long term (current) use of inhaled steroids: Secondary | ICD-10-CM | POA: Diagnosis not present

## 2021-09-26 DIAGNOSIS — K2960 Other gastritis without bleeding: Secondary | ICD-10-CM | POA: Diagnosis not present

## 2021-09-26 DIAGNOSIS — Z6835 Body mass index (BMI) 35.0-35.9, adult: Secondary | ICD-10-CM | POA: Insufficient documentation

## 2021-09-26 DIAGNOSIS — M069 Rheumatoid arthritis, unspecified: Secondary | ICD-10-CM | POA: Diagnosis not present

## 2021-09-26 DIAGNOSIS — G4733 Obstructive sleep apnea (adult) (pediatric): Secondary | ICD-10-CM | POA: Diagnosis not present

## 2021-09-26 DIAGNOSIS — Z86711 Personal history of pulmonary embolism: Secondary | ICD-10-CM | POA: Insufficient documentation

## 2021-09-26 DIAGNOSIS — Z7901 Long term (current) use of anticoagulants: Secondary | ICD-10-CM | POA: Insufficient documentation

## 2021-09-26 DIAGNOSIS — Z79899 Other long term (current) drug therapy: Secondary | ICD-10-CM | POA: Insufficient documentation

## 2021-09-26 DIAGNOSIS — K224 Dyskinesia of esophagus: Secondary | ICD-10-CM | POA: Diagnosis not present

## 2021-09-26 DIAGNOSIS — K589 Irritable bowel syndrome without diarrhea: Secondary | ICD-10-CM | POA: Diagnosis not present

## 2021-09-26 DIAGNOSIS — K317 Polyp of stomach and duodenum: Secondary | ICD-10-CM | POA: Insufficient documentation

## 2021-09-26 DIAGNOSIS — R1310 Dysphagia, unspecified: Secondary | ICD-10-CM | POA: Diagnosis present

## 2021-09-26 DIAGNOSIS — E785 Hyperlipidemia, unspecified: Secondary | ICD-10-CM | POA: Insufficient documentation

## 2021-09-26 DIAGNOSIS — E669 Obesity, unspecified: Secondary | ICD-10-CM | POA: Insufficient documentation

## 2021-09-26 DIAGNOSIS — F418 Other specified anxiety disorders: Secondary | ICD-10-CM | POA: Insufficient documentation

## 2021-09-26 DIAGNOSIS — Q399 Congenital malformation of esophagus, unspecified: Secondary | ICD-10-CM | POA: Insufficient documentation

## 2021-09-26 HISTORY — PX: ESOPHAGOGASTRODUODENOSCOPY (EGD) WITH PROPOFOL: SHX5813

## 2021-09-26 SURGERY — ESOPHAGOGASTRODUODENOSCOPY (EGD) WITH PROPOFOL
Anesthesia: General

## 2021-09-26 MED ORDER — EPHEDRINE SULFATE (PRESSORS) 50 MG/ML IJ SOLN
INTRAMUSCULAR | Status: DC | PRN
  Administered 2021-09-26: 10 mg via INTRAVENOUS

## 2021-09-26 MED ORDER — PROPOFOL 10 MG/ML IV BOLUS
INTRAVENOUS | Status: DC | PRN
  Administered 2021-09-26: 20 mg via INTRAVENOUS
  Administered 2021-09-26 (×2): 10 mg via INTRAVENOUS
  Administered 2021-09-26: 30 mg via INTRAVENOUS

## 2021-09-26 MED ORDER — PROPOFOL 500 MG/50ML IV EMUL
INTRAVENOUS | Status: DC | PRN
  Administered 2021-09-26: 125 ug/kg/min via INTRAVENOUS

## 2021-09-26 MED ORDER — GLYCOPYRROLATE 0.2 MG/ML IJ SOLN
INTRAMUSCULAR | Status: DC | PRN
  Administered 2021-09-26: .2 mg via INTRAVENOUS

## 2021-09-26 MED ORDER — LIDOCAINE HCL (CARDIAC) PF 100 MG/5ML IV SOSY
PREFILLED_SYRINGE | INTRAVENOUS | Status: DC | PRN
  Administered 2021-09-26: 100 mg via INTRAVENOUS

## 2021-09-26 MED ORDER — SODIUM CHLORIDE 0.9 % IV SOLN
INTRAVENOUS | Status: DC
Start: 2021-09-26 — End: 2021-09-26
  Administered 2021-09-26: 20 mL/h via INTRAVENOUS

## 2021-09-26 NOTE — Anesthesia Preprocedure Evaluation (Addendum)
Anesthesia Evaluation  Patient identified by MRN, date of birth, ID band Patient awake    Reviewed: Allergy & Precautions, NPO status , Patient's Chart, lab work & pertinent test results  History of Anesthesia Complications (+) DIFFICULT AIRWAY and history of anesthetic complications  Airway Mallampati: II  TM Distance: <3 FB Neck ROM: limited    Dental  (+) Chipped, Poor Dentition, Missing, Upper Dentures   Pulmonary shortness of breath, asthma , sleep apnea ,  H/o pulmonary embolism and is on Eliquis. Held per cardiology recs   Pulmonary exam normal        Cardiovascular Exercise Tolerance: Good (-) angina+CHF (HFrEF)  (-) Past MI and (-) DOE Normal cardiovascular exam  H/o SVT   Neuro/Psych PSYCHIATRIC DISORDERS negative neurological ROS     GI/Hepatic Neg liver ROS, GERD  Medicated and Controlled,  Endo/Other  negative endocrine ROS  Renal/GU negative Renal ROS  negative genitourinary   Musculoskeletal  (+) Arthritis , Rheumatoid disorders,    Abdominal Normal abdominal exam  (+)   Peds  Hematology negative hematology ROS (+)   Anesthesia Other Findings Past Medical History: No date: Allergic rhinitis No date: Anemia No date: Anxiety No date: Arthritis No date: Asthma No date: Atrophic vaginitis No date: B12 deficiency No date: Bronchitis, chronic (HCC) No date: Cervical disc disease No date: Collagen vascular disease (HCC)     Comment:  RA No date: Depression No date: Fibrocystic breast disease No date: Foot ulcer (HCC) No date: GERD (gastroesophageal reflux disease) No date: Hammer toe No date: Hyperlipidemia No date: IBS (irritable bowel syndrome) No date: Obesity No date: Obstructive sleep apnea No date: OSA (obstructive sleep apnea) No date: Osteoarthrosis     Comment:  Multi site No date: Osteopenia No date: Rheumatoid arthritis (HCC) No date: Shingles No date: Thyroid nodule      Comment:  MULTIPLE No date: Tonsillitis     Comment:  recurrent No date: Umbilical hernia  Past Surgical History: 2011: BACK SURGERY     Comment:  LAMINECTOMY No date: BREAST BIOPSY; Right     Comment:  neg 2011, 2012: CERVICAL FUSION     Comment:  X 2 2005, 2015: COLONOSCOPY 2005, 2013, 2015: ESOPHAGOGASTRODUODENOSCOPY ENDOSCOPY No date: EYE SURGERY; Bilateral     Comment:  Cataract Extraction with IOL  No date: HERNIA REPAIR No date: JOINT REPLACEMENT 07/12/2016: KNEE ARTHROPLASTY; Left     Comment:  Procedure: COMPUTER ASSISTED TOTAL KNEE ARTHROPLASTY;                Surgeon: Donato Heinz, MD;  Location: [facility] ORS;                Service: Orthopedics;  Laterality: Left; No date: Laryngeal tear after intubation w/repair 12/2009: LUMBAR DISC SURGERY 06/2007: REPLACEMENT TOTAL KNEE; Right     Comment:  DR. Ernest Pine, [facility] No date: REPLACEMENT UNICONDYLAR JOINT KNEE     Comment:  DR. Ernest Pine, [facility] No date: SHOULDER ARTHROSCOPY WITH ROTATOR CUFF REPAIR; Right No date: TONSILLECTOMY 01/31/2017: TOTAL HIP ARTHROPLASTY; Right     Comment:  Procedure: TOTAL HIP ARTHROPLASTY;  Surgeon: Donato Heinz, MD;  Location: [facility] ORS;  Service: Orthopedics;               Laterality: Right; No date: TUBAL LIGATION No date: UPPER GI ENDOSCOPY  BMI    Body Mass Index: 35.90 kg/m      Reproductive/Obstetrics  negative OB ROS                           Anesthesia Physical  Anesthesia Plan  ASA: 3  Anesthesia Plan: General   Post-op Pain Management: Minimal or no pain anticipated   Induction: Intravenous  PONV Risk Score and Plan: Propofol infusion and TIVA  Airway Management Planned: Natural Airway and Nasal Cannula  Additional Equipment:   Intra-op Plan:   Post-operative Plan:   Informed Consent: I have reviewed the patients History and Physical, chart, labs and discussed the procedure including the risks, benefits and alternatives for  the proposed anesthesia with the patient or authorized representative who has indicated his/her understanding and acceptance.     Dental Advisory Given  Plan Discussed with: Anesthesiologist, CRNA and Surgeon  Anesthesia Plan Comments: (Patient consented for risks of anesthesia including but not limited to:  - adverse reactions to medications - risk of airway placement if required - damage to eyes, teeth, lips or other oral mucosa - nerve damage due to positioning  - sore throat or hoarseness - Damage to heart, brain, nerves, lungs, other parts of body or loss of life  Patient voiced understanding.)       Anesthesia Quick Evaluation

## 2021-09-26 NOTE — Anesthesia Postprocedure Evaluation (Signed)
Anesthesia Post Note  Patient: Courtney Wilcox  Procedure(s) Performed: ESOPHAGOGASTRODUODENOSCOPY (EGD) WITH PROPOFOL  Patient location during evaluation: PACU Anesthesia Type: General Level of consciousness: awake and alert Pain management: pain level controlled Vital Signs Assessment: post-procedure vital signs reviewed and stable Respiratory status: spontaneous breathing, nonlabored ventilation and respiratory function stable Cardiovascular status: stable and blood pressure returned to baseline Postop Assessment: no apparent nausea or vomiting Anesthetic complications: no   No notable events documented.   Last Vitals:  Vitals:   09/26/21 1006 09/26/21 1026  BP: 116/61   Pulse: 66   Resp: (!) 23   Temp: (!) 35.8 C   SpO2: 91% 100%    Last Pain:  Vitals:   09/26/21 1026  TempSrc:   PainSc: 0-No pain                 Foye Deer

## 2021-09-26 NOTE — H&P (Addendum)
Pre-Procedure H&P   Patient ID: Courtney Wilcox is a 76 y.o. female.  Gastroenterology Provider: Annamaria Helling, DO  Referring Provider: Stephens November, NP PCP: Marinda Elk, MD  Date: 09/26/2021  HPI Courtney Wilcox is a 76 y.o. female who presents today for Esophagogastroduodenoscopy for dysphagia.  Patient with a history of SVT on Eliquis which has been held since Friday (last dose Thursday night), rheumatoid arthritis on Plaquenil and leflunomide who presents for evaluation for worsening heartburn and solid food dysphagia.  She has no abnormal weight loss or history of odynophagia.  Similar symptoms in 2015 where she underwent EGD with dilation with through-the-scope balloon up to 18 mm.  She is known to have a medium size hiatal hernia and negative for H. pylori at that point in time.  She does have a right hip replacement  Denies any melena hematochezia or constipation.  Has noted some loose stools but is started multiple new medications as well.  Last hemoglobin 9.3 MCV 99.7 platelets 157,000   Past Medical History:  Diagnosis Date   Allergic rhinitis    Anemia    Anxiety    Arthritis    Asthma    Atrophic vaginitis    B12 deficiency    Bronchitis, chronic (HCC)    Cervical disc disease    Collagen vascular disease (HCC)    RA   Depression    Fibrocystic breast disease    Foot ulcer (HCC)    GERD (gastroesophageal reflux disease)    Hammer toe    Hyperlipidemia    IBS (irritable bowel syndrome)    Obesity    Obstructive sleep apnea    OSA (obstructive sleep apnea)    Osteoarthrosis    Multi site   Osteopenia    Rheumatoid arthritis ([place])    Shingles    Thyroid nodule    MULTIPLE   Tonsillitis    recurrent   Umbilical hernia     Past Surgical History:  Procedure Laterality Date   BACK SURGERY  2011   LAMINECTOMY   BREAST BIOPSY Right    neg   CERVICAL FUSION  2011, 2012   X 2   COLONOSCOPY  2005, 2015   COLONOSCOPY  WITH PROPOFOL N/A 08/04/2020   Procedure: COLONOSCOPY WITH PROPOFOL;  Surgeon: Robert Bellow, MD;  Location: [facility];  Service: Endoscopy;  Laterality: N/A;   ESOPHAGOGASTRODUODENOSCOPY ENDOSCOPY  2005, 2013, 2015   EYE SURGERY Bilateral    Cataract Extraction with IOL    HERNIA REPAIR     JOINT REPLACEMENT     KNEE ARTHROPLASTY Left 07/12/2016   Procedure: COMPUTER ASSISTED TOTAL KNEE ARTHROPLASTY;  Surgeon: Dereck Leep, MD;  Location: [facility] ORS;  Service: Orthopedics;  Laterality: Left;   Laryngeal tear after intubation w/repair     LUMBAR DISC SURGERY  12/2009   REPLACEMENT TOTAL KNEE Right 06/2007   DR. HOOTEN, [facility]   REPLACEMENT UNICONDYLAR JOINT KNEE     DR. HOOTEN, [facility]   SHOULDER ARTHROSCOPY WITH ROTATOR CUFF REPAIR Right    TONSILLECTOMY     TOTAL HIP ARTHROPLASTY Right 01/31/2017   Procedure: TOTAL HIP ARTHROPLASTY;  Surgeon: Dereck Leep, MD;  Location: [facility] ORS;  Service: Orthopedics;  Laterality: Right;   TUBAL LIGATION     UPPER GI ENDOSCOPY      Family History No h/o GI disease or malignancy  Review of Systems  Constitutional:  Negative for activity change, appetite change, chills, diaphoresis, fatigue, fever  and unexpected weight change.  HENT:  Positive for trouble swallowing. Negative for voice change.   Respiratory:  Negative for shortness of breath and wheezing.   Cardiovascular:  Negative for chest pain, palpitations and leg swelling.  Gastrointestinal:  Positive for nausea. Negative for abdominal distention, abdominal pain, anal bleeding, blood in stool, constipation, diarrhea, rectal pain and vomiting.  Musculoskeletal:  Negative for arthralgias and myalgias.  Skin:  Negative for color change and pallor.  Neurological:  Negative for dizziness, syncope and weakness.  Psychiatric/Behavioral:  Negative for confusion.   All other systems reviewed and are negative.    Medications No current facility-administered medications on file prior to  encounter.   Current Outpatient Medications on File Prior to Encounter  Medication Sig Dispense Refill   ALPRAZolam (XANAX) 0.5 MG tablet Take by mouth daily as needed.     apixaban (ELIQUIS) 5 MG TABS tablet Take 2 tablets (10 mg total) by mouth 2 (two) times daily. Take 5 mg two times a day from 11/26/2020 60 tablet 3   budesonide-formoterol (SYMBICORT) 160-4.5 MCG/ACT inhaler Inhale 2 puffs into the lungs 2 (two) times daily as needed.     Cholecalciferol (VITAMIN D) 2000 units tablet Take 4,000 Units by mouth daily.      citalopram (CELEXA) 20 MG tablet Take 20 mg by mouth every evening.   5   Cyanocobalamin 1000 MCG/ML KIT Inject as directed every 30 (thirty) days.     estradiol (ESTRACE) 0.1 MG/GM vaginal cream Insert pea size amount vaginally nightly x 2 weeks, then every other night x 2 weeks, then 2-3 times weekly for maintenance     famotidine (PEPCID) 20 MG tablet Take by mouth.     furosemide (LASIX) 40 MG tablet Take 1 tablet (40 mg total) by mouth every other day. 45 tablet 3   hydroxychloroquine (PLAQUENIL) 200 MG tablet Take 1 tablet (200 mg total) by mouth daily. 1 tablet 0   leflunomide (ARAVA) 20 MG tablet Take by mouth.     levalbuterol (XOPENEX) 0.63 MG/3ML nebulizer solution as needed for wheezing.     meclizine (ANTIVERT) 12.5 MG tablet PLEASE SEE ATTACHED FOR DETAILED DIRECTIONS     montelukast (SINGULAIR) 10 MG tablet Take 10 mg by mouth daily as needed (for allergies.).      Multiple Vitamins-Minerals (CENTRUM SILVER 50+WOMEN PO) Take by mouth.     nystatin (MYCOSTATIN) 100000 UNIT/ML suspension Take 5 mLs by mouth as needed.     pantoprazole (PROTONIX) 40 MG tablet Take 40 mg by mouth 2 (two) times daily.     potassium chloride SA (KLOR-CON M) 20 MEQ tablet Take 2 tablets (40 mEq total) by mouth every other day. With your Lasix. 90 tablet 3   predniSONE (DELTASONE) 10 MG tablet Take 10 mg by mouth daily.     Simethicone 180 MG CAPS Take 1-2 capsules by mouth as  needed.     triamcinolone (NASACORT) 55 MCG/ACT AERO nasal inhaler Place 2 sprays into the nose daily.     acetaminophen (TYLENOL) 500 MG tablet Take 1,000 mg by mouth 3 (three) times daily as needed for mild pain.       Pertinent medications related to GI and procedure were reviewed by me with the patient prior to the procedure   Current Facility-Administered Medications:    0.9 %  sodium chloride infusion, , Intravenous, Continuous, Annamaria Helling, DO, Last Rate: 20 mL/hr at 09/26/21 0848, 20 mL/hr at 09/26/21 0848  sodium chloride 20 mL/hr (09/26/21  [email or web address])       Allergies  Allergen Reactions   Lactose Other (See Comments) and Diarrhea   Other Other (See Comments)    Pt ONLY tolerates SLOW-FE (slow released iron)---upsets IBS   Biaxin [Clarithromycin] Other (See Comments)    GI upset    Hydrocodone-Acetaminophen Itching    Tolerates acetaminophen    Oxycodone Itching   Sulfa Antibiotics Other (See Comments) and Diarrhea    GI upset  GI upset GI upset    Sulfasalazine Diarrhea   Allergies were reviewed by me prior to the procedure  Objective   Body mass index is 34.39 kg/m. Vitals:   09/26/21 0837  BP: (!) 146/71  Pulse: 70  Resp: 20  Temp: (!) 96.9 F (36.1 C)  TempSrc: Temporal  SpO2: 100%  Weight: 85.3 kg  Height: '5\' 2"'  (1.575 m)     Physical Exam Vitals and nursing note reviewed.  Constitutional:      General: She is not in acute distress.    Appearance: She is obese. She is not ill-appearing, toxic-appearing or diaphoretic.     Comments: Frail appearing  HENT:     Head: Normocephalic and atraumatic.     Nose: Nose normal.     Mouth/Throat:     Mouth: Mucous membranes are moist.     Pharynx: Oropharynx is clear.     Comments: Edentulous Eyes:     General: No scleral icterus.    Extraocular Movements: Extraocular movements intact.  Cardiovascular:     Rate and Rhythm: Normal rate and regular rhythm.     Heart sounds: Normal heart  sounds. No murmur heard.    No friction rub. No gallop.  Pulmonary:     Effort: Pulmonary effort is normal. No respiratory distress.     Breath sounds: Normal breath sounds. No wheezing, rhonchi or rales.  Abdominal:     General: Bowel sounds are normal. There is no distension.     Palpations: Abdomen is soft.     Tenderness: There is no abdominal tenderness. There is no guarding or rebound.  Musculoskeletal:     Cervical back: Neck supple.     Right lower leg: No edema.     Left lower leg: No edema.  Skin:    General: Skin is warm and dry.     Coloration: Skin is not jaundiced or pale.  Neurological:     General: No focal deficit present.     Mental Status: She is alert and oriented to person, place, and time. Mental status is at baseline.  Psychiatric:        Mood and Affect: Mood normal.        Behavior: Behavior normal.        Thought Content: Thought content normal.        Judgment: Judgment normal.      Assessment:  Courtney Wilcox is a 76 y.o. female  who presents today for Esophagogastroduodenoscopy for dysphagia.  Plan:  Esophagogastroduodenoscopy with possible intervention today  Esophagogastroduodenoscopy with possible biopsy, control of bleeding, polypectomy, and interventions as necessary has been discussed with the patient/patient representative. Informed consent was obtained from the patient/patient representative after explaining the indication, nature, and risks of the procedure including but not limited to death, bleeding, perforation, missed neoplasm/lesions, cardiorespiratory compromise, and reaction to medications. Opportunity for questions was given and appropriate answers were provided. Patient/patient representative has verbalized understanding is amenable to undergoing the procedure.   Annamaria Helling, DO  [facility]  Portions of the record may have been created with voice recognition software. Occasional wrong-word or  'sound-a-like' substitutions may have occurred due to the inherent limitations of voice recognition software.  Read the chart carefully and recognize, using context, where substitutions may have occurred.

## 2021-09-26 NOTE — Transfer of Care (Signed)
Immediate Anesthesia Transfer of Care Note  Patient: Courtney Wilcox  Procedure(s) Performed: ESOPHAGOGASTRODUODENOSCOPY (EGD) WITH PROPOFOL  Patient Location: Endoscopy Unit  Anesthesia Type:General  Level of Consciousness: awake, drowsy and patient cooperative  Airway & Oxygen Therapy: Patient Spontanous Breathing and Patient connected to face mask oxygen  Post-op Assessment: Report given to RN and Post -op Vital signs reviewed and stable  Post vital signs: Reviewed and stable  Last Vitals:  Vitals Value Taken Time  BP 116/61 09/26/21 1007  Temp 35.8 C 09/26/21 1006  Pulse 77 09/26/21 1008  Resp 26 09/26/21 1009  SpO2 80 % 09/26/21 1008  Vitals shown include unvalidated device data.  Last Pain:  Vitals:   09/26/21 1006  TempSrc: Temporal  PainSc:          Complications: No notable events documented.

## 2021-09-26 NOTE — Interval H&P Note (Signed)
History and Physical Interval Note: Preprocedure H&P from 09/26/21  was reviewed and there was no interval change after seeing and examining the patient.  Written consent was obtained from the patient after discussion of risks, benefits, and alternatives. Patient has consented to proceed with Esophagogastroduodenoscopy with possible intervention   09/26/2021 9:36 AM  Courtney Wilcox  has presented today for surgery, with the diagnosis of GERD, ESOPHAGEAL DYSPHAGIA.  The various methods of treatment have been discussed with the patient and family. After consideration of risks, benefits and other options for treatment, the patient has consented to  Procedure(s): ESOPHAGOGASTRODUODENOSCOPY (EGD) WITH PROPOFOL (N/A) as a surgical intervention.  The patient's history has been reviewed, patient examined, no change in status, stable for surgery.  I have reviewed the patient's chart and labs.  Questions were answered to the patient's satisfaction.     Jaynie Collins

## 2021-09-26 NOTE — Op Note (Signed)
[facility] Gastroenterology Patient Name: Courtney Wilcox Procedure Date: 09/26/2021 9:32 AM MRN: [id number] Account #: [id number] Date of Birth: Mar 28, 1945 Admit Type: Outpatient Age: 76 Room: [facility] ENDO ROOM 2 Gender: Female Note Status: Finalized Instrument Name: Upper Endoscope [id number] Procedure:             Upper GI endoscopy Indications:           Dysphagia Providers:             Annamaria Helling DO, DO Medicines:             Monitored Anesthesia Care Complications:         No immediate complications. Estimated blood loss:                         Minimal. Procedure:             Pre-Anesthesia Assessment:                        - Prior to the procedure, a History and Physical was                         performed, and patient medications and allergies were                         reviewed. The patient is competent. The risks and                         benefits of the procedure and the sedation options and                         risks were discussed with the patient. All questions                         were answered and informed consent was obtained.                         Patient identification and proposed procedure were                         verified by the physician, the nurse, the anesthetist                         and the technician in the endoscopy suite. Mental                         Status Examination: alert and oriented. Airway                         Examination: normal oropharyngeal airway and neck                         mobility. Respiratory Examination: clear to                         auscultation. CV Examination: RRR, no murmurs, no S3                         or S4. Prophylactic Antibiotics: The patient does not  require prophylactic antibiotics. Prior                         Anticoagulants: The patient has taken Eliquis                         (apixaban), last dose was 4 days prior to procedure.                          ASA Grade Assessment: III - A patient with severe                         systemic disease. After reviewing the risks and                         benefits, the patient was deemed in satisfactory                         condition to undergo the procedure. The anesthesia                         plan was to use monitored anesthesia care (MAC).                         Immediately prior to administration of medications,                         the patient was re-assessed for adequacy to receive                         sedatives. The heart rate, respiratory rate, oxygen                         saturations, blood pressure, adequacy of pulmonary                         ventilation, and response to care were monitored                         throughout the procedure. The physical status of the                         patient was re-assessed after the procedure.                        After obtaining informed consent, the endoscope was                         passed under direct vision. Throughout the procedure,                         the patient's blood pressure, pulse, and oxygen                         saturations were monitored continuously. The Endoscope                         was introduced through the mouth, and advanced to the  second part of duodenum. The upper GI endoscopy was                         accomplished without difficulty. The patient tolerated                         the procedure well. Findings:      The duodenal bulb, first portion of the duodenum and second portion of       the duodenum were normal. Estimated blood loss: none.      Localized mild inflammation characterized by erosions and erythema was       found in the stomach. Biopsies were taken with a cold forceps for       histology. Biopsies taken of antrum and body separately. Estimated blood       loss was minimal.      A single less than 1 mm sessile polyp with no bleeding and no  stigmata       of recent bleeding was found in the gastric antrum. The polyp was       removed with a cold biopsy forceps. Resection and retrieval were       complete. Estimated blood loss was minimal.      A small hiatal hernia was present. Estimated blood loss: none.      The Z-line was regular. Estimated blood loss: none.      Esophagogastric landmarks were identified: the gastroesophageal junction       was found at 35 cm from the incisors.      Normal mucosa was found in the entire esophagus. Biopsies were taken       with a cold forceps for histology. Bx from mid esophagus. Estimated       blood loss was minimal.      The examined esophagus was mildly tortuous. Estimated blood loss: none.      Abnormal motility was noted in the esophagus. The cricopharyngeus was       normal. There is spasticity of the esophageal body. The distal       esophagus/lower esophageal sphincter is open. Tertiary peristaltic waves       are noted. A guidewire was placed and the scope was withdrawn. Dilation       was performed with a Savary dilator with no resistance at 42 Fr, 45 Fr       and 51 Fr. The dilation site was examined following endoscope       reinsertion and showed mild mucosal disruption. Superficial tear noted       on 51 Fr Savary. Estimated blood loss was minimal.      The exam of the esophagus was otherwise normal. Impression:            - Normal duodenal bulb, first portion of the duodenum                         and second portion of the duodenum.                        - Gastritis. Biopsied.                        - A single gastric polyp. Resected and retrieved.                        -  Small hiatal hernia.                        - Z-line regular.                        - Esophagogastric landmarks identified.                        - Normal mucosa was found in the entire esophagus.                         Biopsied.                        - Tortuous esophagus.                         - Abnormal esophageal motility, suspicious for                         presbyesophagus. Dilated. Recommendation:        - Patient has a contact number available for                         emergencies. The signs and symptoms of potential                         delayed complications were discussed with the patient.                         Return to normal activities tomorrow. Written                         discharge instructions were provided to the patient.                        - Discharge patient to home.                        - Resume previous diet.                        - Continue present medications.                        - No aspirin, ibuprofen, naproxen, or other                         non-steroidal anti-inflammatory drugs for 5 days.                        - continue twice a day protonix/pantroprazole.                        - Resume Eliquis (apixaban) at prior dose in 2 days.                         Refer to managing physician for further adjustment of                         therapy.                        - (  [facility] 09/28/21)                        - Await pathology results.                        - Return to GI clinic as previously scheduled.                        - The findings and recommendations were discussed with                         the patient. Procedure Code(s):     --- Professional ---                        [phone], Esophagogastroduodenoscopy, flexible,                         transoral; with insertion of guide wire followed by                         passage of dilator(s) through esophagus over guide wire                        [id number], 59,51, Esophagogastroduodenoscopy, flexible,                         transoral; with biopsy, single or multiple Diagnosis Code(s):     --- Professional ---                        K29.70, Gastritis, unspecified, without bleeding                        K31.7, Polyp of stomach and duodenum                        K44.9,  Diaphragmatic hernia without obstruction or                         gangrene                        Q39.9, Congenital malformation of esophagus,                         unspecified                        K22.4, Dyskinesia of esophagus                        R13.10, Dysphagia, unspecified CPT copyright 2019 [employer]. All rights reserved. The codes documented in this report are preliminary and upon coder review may  be revised to meet current compliance requirements. Attending Participation:      I personally performed the entire procedure. Volney American, DO Annamaria Helling DO, DO 09/26/2021 10:12:31 AM This report has been signed electronically. Number of Addenda: 0 Note Initiated On: 09/26/2021 9:32 AM Estimated Blood Loss:  Estimated blood loss was minimal.      [facility]

## 2021-09-27 ENCOUNTER — Encounter: Payer: Self-pay | Admitting: Gastroenterology

## 2021-09-27 LAB — SURGICAL PATHOLOGY

## 2021-09-28 ENCOUNTER — Other Ambulatory Visit: Payer: Self-pay | Admitting: Nephrology

## 2021-09-28 DIAGNOSIS — N179 Acute kidney failure, unspecified: Secondary | ICD-10-CM

## 2021-10-06 ENCOUNTER — Ambulatory Visit
Admission: RE | Admit: 2021-10-06 | Discharge: 2021-10-06 | Disposition: A | Discharge status: Home / Self Care | Payer: Medicare Other | Source: Ambulatory Visit | Attending: Nephrology | Admitting: Nephrology

## 2021-10-06 DIAGNOSIS — N179 Acute kidney failure, unspecified: Secondary | ICD-10-CM

## 2021-10-11 ENCOUNTER — Other Ambulatory Visit (HOSPITAL_COMMUNITY): Payer: Self-pay | Admitting: Gastroenterology

## 2021-10-11 ENCOUNTER — Other Ambulatory Visit: Payer: Self-pay | Admitting: Gastroenterology

## 2021-10-11 DIAGNOSIS — R197 Diarrhea, unspecified: Secondary | ICD-10-CM

## 2021-10-11 DIAGNOSIS — R1032 Left lower quadrant pain: Secondary | ICD-10-CM

## 2021-10-11 DIAGNOSIS — K219 Gastro-esophageal reflux disease without esophagitis: Secondary | ICD-10-CM

## 2021-10-21 ENCOUNTER — Ambulatory Visit: Payer: Medicare Other

## 2021-10-25 ENCOUNTER — Ambulatory Visit
Admission: RE | Admit: 2021-10-25 | Discharge: 2021-10-25 | Disposition: A | Discharge status: Home / Self Care | Payer: Medicare Other | Source: Ambulatory Visit | Attending: Gastroenterology | Admitting: Gastroenterology

## 2021-10-25 DIAGNOSIS — R1032 Left lower quadrant pain: Secondary | ICD-10-CM | POA: Diagnosis present

## 2021-10-28 ENCOUNTER — Ambulatory Visit
Admission: RE | Admit: 2021-10-28 | Discharge: 2021-10-28 | Disposition: A | Discharge status: Home / Self Care | Payer: Medicare Other | Source: Ambulatory Visit | Attending: Gastroenterology | Admitting: Gastroenterology

## 2021-10-28 DIAGNOSIS — K219 Gastro-esophageal reflux disease without esophagitis: Secondary | ICD-10-CM | POA: Insufficient documentation

## 2021-10-28 DIAGNOSIS — R197 Diarrhea, unspecified: Secondary | ICD-10-CM | POA: Insufficient documentation

## 2021-10-31 ENCOUNTER — Ambulatory Visit: Payer: Medicare Other | Attending: Cardiology

## 2021-10-31 DIAGNOSIS — R0602 Shortness of breath: Secondary | ICD-10-CM

## 2021-10-31 DIAGNOSIS — R079 Chest pain, unspecified: Secondary | ICD-10-CM | POA: Diagnosis not present

## 2021-10-31 DIAGNOSIS — I471 Supraventricular tachycardia, unspecified: Secondary | ICD-10-CM

## 2021-10-31 DIAGNOSIS — I517 Cardiomegaly: Secondary | ICD-10-CM | POA: Insufficient documentation

## 2021-10-31 DIAGNOSIS — E785 Hyperlipidemia, unspecified: Secondary | ICD-10-CM | POA: Diagnosis not present

## 2021-10-31 DIAGNOSIS — I340 Nonrheumatic mitral (valve) insufficiency: Secondary | ICD-10-CM | POA: Insufficient documentation

## 2021-11-01 LAB — ECHOCARDIOGRAM COMPLETE
AR max vel: 3.08 cm2
AV Area VTI: 2.87 cm2
AV Area mean vel: 2.91 cm2
AV Mean grad: 4 mmHg
AV Peak grad: 8.5 mmHg
Ao pk vel: 1.46 m/s
Area-P 1/2: 3.97 cm2
Calc EF: 55 %
S' Lateral: 3.5 cm
Single Plane A2C EF: 55.7 %
Single Plane A4C EF: 54.7 %

## 2021-11-02 ENCOUNTER — Encounter: Payer: Self-pay | Admitting: Cardiology

## 2021-11-02 ENCOUNTER — Ambulatory Visit: Payer: Medicare Other | Attending: Cardiology | Admitting: Cardiology

## 2021-11-02 VITALS — BP 132/84 | HR 69 | Ht 62.0 in | Wt 193.8 lb

## 2021-11-02 DIAGNOSIS — R0602 Shortness of breath: Secondary | ICD-10-CM | POA: Insufficient documentation

## 2021-11-02 DIAGNOSIS — I471 Supraventricular tachycardia, unspecified: Secondary | ICD-10-CM | POA: Diagnosis not present

## 2021-11-02 NOTE — Progress Notes (Signed)
Electrophysiology Office Follow up Visit Note:    Date:  11/02/2021   ID:  Courtney Wilcox, DOB [birth year], MRN [id number]  PCP:  Marinda Elk, MD  [facility] Cardiologist:  None  [facility] Electrophysiologist:  None    Interval History:    Courtney Wilcox is a 76 y.o. female who presents for a follow up visit. They were last seen in clinic September 21, 2021 for SVT and shortness of breath.  Today she continues to report severe shortness of breath with exertion and wheezing.  This is only minimally responsive to inhaler therapies.   At her last appointment we discussed potentially using a loop recorder for ongoing heart rhythm surveillance given history of SVT.  Today her history is more consistent with a primary pulmonary process as the cause of her shortness of breath.  She is with her family who I have previously met.       Past Medical History:  Diagnosis Date   Allergic rhinitis    Anemia    Anxiety    Arthritis    Asthma    Atrophic vaginitis    B12 deficiency    Bronchitis, chronic (HCC)    Cervical disc disease    Collagen vascular disease (HCC)    RA   Depression    Fibrocystic breast disease    Foot ulcer (HCC)    GERD (gastroesophageal reflux disease)    Hammer toe    Hyperlipidemia    IBS (irritable bowel syndrome)    Obesity    Obstructive sleep apnea    OSA (obstructive sleep apnea)    Osteoarthrosis    Multi site   Osteopenia    Rheumatoid arthritis ([place])    Shingles    Thyroid nodule    MULTIPLE   Tonsillitis    recurrent   Umbilical hernia     Past Surgical History:  Procedure Laterality Date   BACK SURGERY  2011   LAMINECTOMY   BREAST BIOPSY Right    neg   CERVICAL FUSION  2011, 2012   X 2   COLONOSCOPY  2005, 2015   COLONOSCOPY WITH PROPOFOL N/A 08/04/2020   Procedure: COLONOSCOPY WITH PROPOFOL;  Surgeon: Robert Bellow, MD;  Location: [facility];  Service: Endoscopy;  Laterality: N/A;    ESOPHAGOGASTRODUODENOSCOPY (EGD) WITH PROPOFOL N/A 09/26/2021   Procedure: ESOPHAGOGASTRODUODENOSCOPY (EGD) WITH PROPOFOL;  Surgeon: Annamaria Helling, DO;  Location: [place];  Service: Gastroenterology;  Laterality: N/A;   ESOPHAGOGASTRODUODENOSCOPY ENDOSCOPY  2005, 2013, 2015   EYE SURGERY Bilateral    Cataract Extraction with IOL    HERNIA REPAIR     JOINT REPLACEMENT     KNEE ARTHROPLASTY Left 07/12/2016   Procedure: COMPUTER ASSISTED TOTAL KNEE ARTHROPLASTY;  Surgeon: Dereck Leep, MD;  Location: [facility];  Service: Orthopedics;  Laterality: Left;   Laryngeal tear after intubation w/repair     LUMBAR DISC SURGERY  12/2009   REPLACEMENT TOTAL KNEE Right 06/2007   DR. HOOTEN, [facility]   REPLACEMENT UNICONDYLAR JOINT KNEE     DR. HOOTEN, [facility]   SHOULDER ARTHROSCOPY WITH ROTATOR CUFF REPAIR Right    TONSILLECTOMY     TOTAL HIP ARTHROPLASTY Right 01/31/2017   Procedure: TOTAL HIP ARTHROPLASTY;  Surgeon: Dereck Leep, MD;  Location: [facility];  Service: Orthopedics;  Laterality: Right;   TUBAL LIGATION     UPPER GI ENDOSCOPY      Current Medications: Current Meds  Medication Sig   acetaminophen (TYLENOL)  500 MG tablet Take 1,000 mg by mouth 3 (three) times daily as needed for mild pain.    ALPRAZolam (XANAX) 0.5 MG tablet Take by mouth daily as needed.   apixaban (ELIQUIS) 5 MG TABS tablet Take 2 tablets (10 mg total) by mouth 2 (two) times daily. Take 5 mg two times a day from 11/26/2020   budesonide (PULMICORT) 0.25 MG/2ML nebulizer solution Inhale into the lungs.   Cholecalciferol (VITAMIN D) 2000 units tablet Take 4,000 Units by mouth daily.    citalopram (CELEXA) 20 MG tablet Take 20 mg by mouth every evening.    Cyanocobalamin 1000 MCG/ML KIT Inject as directed every 30 (thirty) days.   estradiol (ESTRACE) 0.1 MG/GM vaginal cream Insert pea size amount vaginally nightly x 2 weeks, then every other night x 2 weeks, then 2-3 times weekly for maintenance   famotidine  (PEPCID) 20 MG tablet Take by mouth.   furosemide (LASIX) 40 MG tablet Take 1 tablet (40 mg total) by mouth every other day.   hydroxychloroquine (PLAQUENIL) 200 MG tablet Take 1 tablet (200 mg total) by mouth daily.   leflunomide (ARAVA) 20 MG tablet Take by mouth.   levalbuterol (XOPENEX) 0.63 MG/3ML nebulizer solution as needed for wheezing.   meclizine (ANTIVERT) 12.5 MG tablet PLEASE SEE ATTACHED FOR DETAILED DIRECTIONS   metoprolol succinate (TOPROL-XL) 50 MG 24 hr tablet Take 1 tablet (50 mg total) by mouth 2 (two) times daily after a meal. Take with or immediately following a meal.   montelukast (SINGULAIR) 10 MG tablet Take 10 mg by mouth daily as needed (for allergies.).    Multiple Vitamins-Minerals (CENTRUM SILVER 50+WOMEN PO) Take by mouth.   nystatin (MYCOSTATIN) 100000 UNIT/ML suspension Take 5 mLs by mouth as needed.   pantoprazole (PROTONIX) 40 MG tablet Take 40 mg by mouth 2 (two) times daily.   potassium chloride SA (KLOR-CON M) 20 MEQ tablet Take 2 tablets (40 mEq total) by mouth every other day. With your Lasix.   predniSONE (DELTASONE) 10 MG tablet Take 10 mg by mouth daily.   Simethicone 180 MG CAPS Take 1-2 capsules by mouth as needed.   triamcinolone (NASACORT) 55 MCG/ACT AERO nasal inhaler Place 2 sprays into the nose daily.     Allergies:   Lactose, Other, Biaxin [clarithromycin], Hydrocodone-acetaminophen, Oxycodone, Sulfa antibiotics, and Sulfasalazine   Social History   Socioeconomic History   Marital status: Married    Spouse name: Not on file   Number of children: Not on file   Years of education: Not on file   Highest education level: Not on file  Occupational History   Not on file  Tobacco Use   Smoking status: Never   Smokeless tobacco: Never  Vaping Use   Vaping Use: Never used  Substance and Sexual Activity   Alcohol use: No   Drug use: No   Sexual activity: Not on file  Other Topics Concern   Not on file  Social History Narrative   Not on  file   Social Determinants of Health   Financial Resource Strain: Not on file  Food Insecurity: Not on file  Transportation Needs: Not on file  Physical Activity: Not on file  Stress: Not on file  Social Connections: Not on file     Family History: The patient's family history includes Asthma in her mother; Breast cancer (age of onset: 37) in her mother; Emphysema in her father; Heart disease in her father; Lung cancer in her father.  ROS:  Please see the history of present illness.    All other systems reviewed and are negative.  EKGs/Labs/Other Studies Reviewed:    The following studies were reviewed today:   EKG:  The ekg ordered today demonstrates sinus rhythm  Recent Labs: 11/20/2020: ALT 18; Hemoglobin 10.1; Platelets 123 07/13/2021: BUN 18; Creatinine, Ser 1.55; Magnesium 1.9; Potassium 3.0; Sodium 146 08/24/2021: B Natriuretic Peptide 181.1  Recent Lipid Panel No results found for: "CHOL", "TRIG", "HDL", "CHOLHDL", "VLDL", "LDLCALC", "LDLDIRECT"  Physical Exam:    VS:  BP 132/84   Pulse 69   Ht '5\' 2"'  (1.575 m)   Wt 193 lb 12.8 oz (87.9 kg)   SpO2 96%   BMI 35.45 kg/m     Wt Readings from Last 3 Encounters:  11/02/21 193 lb 12.8 oz (87.9 kg)  09/26/21 188 lb (85.3 kg)  09/21/21 190 lb 3.2 oz (86.3 kg)     GEN: Elderly, in no distress HEENT: Normal NECK: No JVD; No carotid bruits LYMPHATICS: No lymphadenopathy CARDIAC: RRR, no murmurs, rubs, gallops RESPIRATORY: Scattered wheezing on end expiration.  No rhonchi or rales.    ABDOMEN: Soft, non-tender, non-distended MUSCULOSKELETAL:  No edema; No deformity  SKIN: Warm and dry NEUROLOGIC:  Alert and oriented x 3 PSYCHIATRIC:  Normal affect        ASSESSMENT:    1. SVT (supraventricular tachycardia)   2. Shortness of breath    PLAN:    In order of problems listed above:  #SVT Very low burden.  Does not seem to be the primary cause of her symptoms.  I have recommended that she continue taking  metoprolol succinate as prescribed.  #DVT/PE history Continue Eliquis  #Shortness of breath Her shortness of breath seems most consistent with a primary pulmonary process.  She has requested a referral to [facility] pulmonology for second opinion.  I put that in for her today.  She will continue with her current medication regimen.   Follow-up with APP in 6 months.   Medication Adjustments/Labs and Tests Ordered: Current medicines are reviewed at length with the patient today.  Concerns regarding medicines are outlined above.  Orders Placed This Encounter  Procedures   Ambulatory referral to Pulmonology   EKG 12-Lead   No orders of the defined types were placed in this encounter.    Signed, Lars Mage, MD, [facility], [facility] 11/02/2021 8:32 PM    Electrophysiology [facility]

## 2021-11-02 NOTE — Patient Instructions (Signed)
Medication Instructions:  None *If you need a refill on your cardiac medications before your next appointment, please call your pharmacy*   Lab Work: none If you have labs (blood work) drawn today and your tests are completely normal, you will receive your results only by: [place] (if you have MyChart) OR A paper copy in the mail If you have any lab test that is abnormal or we need to change your treatment, we will call you to review the results.   Testing/Procedures: none   Follow-Up: At [facility], you and your health needs are our priority.  As part of our continuing mission to provide you with exceptional heart care, we have created designated Provider Care Teams.  These Care Teams include your primary Cardiologist (physician) and Advanced Practice Providers (APPs -  Physician Assistants and Nurse Practitioners) who all work together to provide you with the care you need, when you need it.  We recommend signing up for the patient portal called "MyChart".  Sign up information is provided on this After Visit Summary.  MyChart is used to connect with patients for Virtual Visits (Telemedicine).  Patients are able to view lab/test results, encounter notes, upcoming appointments, etc.  Non-urgent messages can be sent to your provider as well.   To learn more about what you can do with MyChart, go to [email or web address].    Your next appointment:   6 month(s)  The format for your next appointment:   In Person  Provider:   You will see one of the following Advanced Practice Providers on your designated Care Team:   Murray Hodgkins, NP Christell Faith, PA-C Cadence Kathlen Mody, PA-C Gerrie Nordmann, NP      Other Instructions None   Important Information About Sugar

## 2021-11-14 ENCOUNTER — Encounter (INDEPENDENT_AMBULATORY_CARE_PROVIDER_SITE_OTHER): Payer: Self-pay

## 2021-11-29 ENCOUNTER — Ambulatory Visit: Payer: Medicare Other | Admitting: Oncology

## 2021-11-29 ENCOUNTER — Other Ambulatory Visit: Payer: Medicare Other

## 2021-11-30 ENCOUNTER — Institutional Professional Consult (permissible substitution): Payer: Medicare Other | Admitting: Student in an Organized Health Care Education/Training Program

## 2021-12-02 ENCOUNTER — Ambulatory Visit: Payer: Medicare Other | Admitting: Internal Medicine

## 2021-12-07 ENCOUNTER — Encounter: Payer: Self-pay | Admitting: Oncology

## 2021-12-07 ENCOUNTER — Inpatient Hospital Stay: Payer: Medicare Other | Attending: Oncology | Admitting: Oncology

## 2021-12-07 ENCOUNTER — Inpatient Hospital Stay: Payer: Medicare Other

## 2021-12-07 ENCOUNTER — Ambulatory Visit: Payer: Medicare Other | Admitting: Oncology

## 2021-12-07 ENCOUNTER — Other Ambulatory Visit: Payer: Medicare Other

## 2021-12-07 VITALS — BP 155/74 | HR 55 | Temp 98.7°F | Resp 18 | Wt 185.1 lb

## 2021-12-07 DIAGNOSIS — D631 Anemia in chronic kidney disease: Secondary | ICD-10-CM

## 2021-12-07 DIAGNOSIS — M069 Rheumatoid arthritis, unspecified: Secondary | ICD-10-CM | POA: Diagnosis not present

## 2021-12-07 DIAGNOSIS — Z86711 Personal history of pulmonary embolism: Secondary | ICD-10-CM | POA: Diagnosis not present

## 2021-12-07 DIAGNOSIS — Z7901 Long term (current) use of anticoagulants: Secondary | ICD-10-CM | POA: Insufficient documentation

## 2021-12-07 DIAGNOSIS — D539 Nutritional anemia, unspecified: Secondary | ICD-10-CM

## 2021-12-07 DIAGNOSIS — Z79899 Other long term (current) drug therapy: Secondary | ICD-10-CM | POA: Diagnosis not present

## 2021-12-07 DIAGNOSIS — D61818 Other pancytopenia: Secondary | ICD-10-CM | POA: Diagnosis not present

## 2021-12-07 DIAGNOSIS — N183 Chronic kidney disease, stage 3 unspecified: Secondary | ICD-10-CM

## 2021-12-07 DIAGNOSIS — R0602 Shortness of breath: Secondary | ICD-10-CM

## 2021-12-07 DIAGNOSIS — D696 Thrombocytopenia, unspecified: Secondary | ICD-10-CM | POA: Diagnosis not present

## 2021-12-07 LAB — CBC WITH DIFFERENTIAL/PLATELET
Abs Immature Granulocytes: 0.06 10*3/uL (ref 0.00–0.07)
Basophils Absolute: 0.1 10*3/uL (ref 0.0–0.1)
Basophils Relative: 1 %
Eosinophils Absolute: 0.3 10*3/uL (ref 0.0–0.5)
Eosinophils Relative: 5 %
HCT: 31.8 % — ABNORMAL LOW (ref 36.0–46.0)
Hemoglobin: 9.9 g/dL — ABNORMAL LOW (ref 12.0–15.0)
Immature Granulocytes: 1 %
Lymphocytes Relative: 16 %
Lymphs Abs: 0.9 10*3/uL (ref 0.7–4.0)
MCH: 31.9 pg (ref 26.0–34.0)
MCHC: 31.1 g/dL (ref 30.0–36.0)
MCV: 102.6 fL — ABNORMAL HIGH (ref 80.0–100.0)
Monocytes Absolute: 0.4 10*3/uL (ref 0.1–1.0)
Monocytes Relative: 6 %
Neutro Abs: 4.1 10*3/uL (ref 1.7–7.7)
Neutrophils Relative %: 71 %
Platelets: 140 10*3/uL — ABNORMAL LOW (ref 150–400)
RBC: 3.1 MIL/uL — ABNORMAL LOW (ref 3.87–5.11)
RDW: 13.6 % (ref 11.5–15.5)
WBC: 5.8 10*3/uL (ref 4.0–10.5)
nRBC: 0 % (ref 0.0–0.2)

## 2021-12-07 LAB — TECHNOLOGIST SMEAR REVIEW
Plt Morphology: NORMAL
RBC MORPHOLOGY: NORMAL
WBC MORPHOLOGY: NORMAL

## 2021-12-07 LAB — COMPREHENSIVE METABOLIC PANEL
ALT: 20 U/L (ref 0–44)
AST: 30 U/L (ref 15–41)
Albumin: 3.1 g/dL — ABNORMAL LOW (ref 3.5–5.0)
Alkaline Phosphatase: 68 U/L (ref 38–126)
Anion gap: 6 (ref 5–15)
BUN: 10 mg/dL (ref 8–23)
CO2: 28 mmol/L (ref 22–32)
Calcium: 8.2 mg/dL — ABNORMAL LOW (ref 8.9–10.3)
Chloride: 105 mmol/L (ref 98–111)
Creatinine, Ser: 1.12 mg/dL — ABNORMAL HIGH (ref 0.44–1.00)
GFR, Estimated: 52 mL/min — ABNORMAL LOW (ref >60)
Glucose, Bld: 80 mg/dL (ref 70–99)
Potassium: 3.3 mmol/L — ABNORMAL LOW (ref 3.5–5.1)
Sodium: 139 mmol/L (ref 135–145)
Total Bilirubin: 0.7 mg/dL (ref 0.3–1.2)
Total Protein: 5.8 g/dL — ABNORMAL LOW (ref 6.5–8.1)

## 2021-12-07 LAB — RETIC PANEL
Immature Retic Fract: 10.8 % (ref 2.3–15.9)
RBC.: 3.12 MIL/uL — ABNORMAL LOW (ref 3.87–5.11)
Retic Count, Absolute: 32.1 10*3/uL (ref 19.0–186.0)
Retic Ct Pct: 1 % (ref 0.4–3.1)
Reticulocyte Hemoglobin: 33.4 pg (ref >27.9)

## 2021-12-07 LAB — LACTATE DEHYDROGENASE: LDH: 216 U/L — ABNORMAL HIGH (ref 98–192)

## 2021-12-07 LAB — FERRITIN: Ferritin: 21 ng/mL (ref 11–307)

## 2021-12-07 LAB — IRON AND TIBC
Iron: 76 ug/dL (ref 28–170)
Saturation Ratios: 25 % (ref 10.4–31.8)
TIBC: 302 ug/dL (ref 250–450)
UIBC: 226 ug/dL

## 2021-12-07 LAB — IMMATURE PLATELET FRACTION: Immature Platelet Fraction: 8.2 % (ref 1.2–8.6)

## 2021-12-07 LAB — FOLATE: Folate: 9.4 ng/mL (ref >5.9)

## 2021-12-07 NOTE — Progress Notes (Signed)
PT here for follow. Daughter reports that short of breath has increased and pulmonology was concerned about low blood count levels. Pt also reports paleness and tiredness.

## 2021-12-09 ENCOUNTER — Encounter: Payer: Self-pay | Admitting: Oncology

## 2021-12-09 DIAGNOSIS — D631 Anemia in chronic kidney disease: Secondary | ICD-10-CM | POA: Insufficient documentation

## 2021-12-09 LAB — KAPPA/LAMBDA LIGHT CHAINS
Kappa free light chain: 27.2 mg/L — ABNORMAL HIGH (ref 3.3–19.4)
Kappa, lambda light chain ratio: 2.03 — ABNORMAL HIGH (ref 0.26–1.65)
Lambda free light chains: 13.4 mg/L (ref 5.7–26.3)

## 2021-12-09 NOTE — Assessment & Plan Note (Addendum)
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 Patient has normal B12 and folate level, check CBC, CMP, iron, TIBC ferritin, reticulocyte panel, CMP, myeloma panel, peripheral blood flow cytometry, LDH, immature platelet fraction, folate,   I suspect this is either secondary to the marrow suppression due to by rheumatology arthritis medication [Leflutamide, Plaquenil] versus underlying bone marrow disorders.  Recommend discussion with her rheumatologist about other alternative medications, temporarily holding off her current regimen to see if marrow shows any improvement.  If not, consider bone marrow biopsy for further evaluation.  I have placed a call to her rheumatologist office, awaiting callback.  There is also likely a component of anemia secondary to chronic kidney disease. Iron panel was ordered  I discussed about the option of IV Venofer treatments. I discussed about the potential risks including but not limited to allergic reactions/infusion reactions including anaphylactic reactions, phlebitis, high blood pressure, wheezing, SOB, skin rash, weight gain, leg swelling, headache, nausea and fatigue, etc. patient and her daughter agreed with IV Venofer treatment if needed.  Results were available after her clinical encounter.  Plan IV venofer weekly x 4

## 2021-12-09 NOTE — Assessment & Plan Note (Signed)
Chronic thrombocytopenia, mild.  Previous workup was negative. Pending above workup.

## 2021-12-09 NOTE — Progress Notes (Signed)
Hematology/Oncology Follow Up Note [facility]  [phone] [phone]  Patient Care Team: Courtney Elk, MD as PCP - General (Physician Assistant) Courtney Epley, MD as PCP - Electrophysiology (Cardiology)   ASSESSMENT & PLAN:   Macrocytic anemia Microcytic anemia, this is a chronic issue for her. Patient has normal B12 and folate level, check CBC, CMP, iron, TIBC ferritin, reticulocyte panel, CMP, myeloma panel, peripheral blood flow cytometry, LDH, immature platelet fraction, folate,   I suspect this is either secondary to the marrow suppression due to by rheumatology arthritis medication [Leflutamide, Plaquenil] versus underlying bone marrow disorders.  Recommend discussion with her rheumatologist about other alternative medications, temporarily holding off her current regimen to see if marrow shows any improvement.  If not, consider bone marrow biopsy for further evaluation.  I have placed a call to her rheumatologist office, awaiting callback.  There is also likely a component of anemia secondary to chronic kidney disease. Iron panel was ordered  I discussed about the option of IV Venofer treatments. I discussed about the potential risks including but not limited to allergic reactions/infusion reactions including anaphylactic reactions, phlebitis, high blood pressure, wheezing, SOB, skin rash, weight gain, leg swelling, headache, nausea and fatigue, etc. patient and her daughter agreed with IV Venofer treatment if needed.  Results were available after her clinical encounter.  Plan IV venofer weekly x 4    Rheumatoid arthritis ([place]) Patient is currently on leflunomide and Plaquenil. Awaiting rheumatologist to call back for further discussion of her current regimen.  Shortness of breath Multifactorial, secondary to her chronic lung disease, anemia, may be possible drug side effects.  Thrombocytopenia (HCC) Chronic  thrombocytopenia, mild.  Previous workup was negative. Pending above workup.  Orders Placed This Encounter  Procedures   Ferritin    Standing Status:   Future    Number of Occurrences:   1    Standing Expiration Date:   06/07/2022   Iron and TIBC    Standing Status:   Future    Number of Occurrences:   1    Standing Expiration Date:   12/08/2022   CBC with Differential/Platelet    Standing Status:   Future    Number of Occurrences:   1    Standing Expiration Date:   12/08/2022   Retic Panel    Standing Status:   Future    Number of Occurrences:   1    Standing Expiration Date:   12/08/2022   Comprehensive metabolic panel    Standing Status:   Future    Number of Occurrences:   1    Standing Expiration Date:   12/08/2022   Multiple Myeloma Panel (SPEP&IFE w/QIG)    Standing Status:   Future    Number of Occurrences:   1    Standing Expiration Date:   12/08/2022   Kappa/lambda light chains    Standing Status:   Future    Number of Occurrences:   1    Standing Expiration Date:   12/08/2022   Flow cytometry panel-leukemia/lymphoma work-up    Standing Status:   Future    Number of Occurrences:   1    Standing Expiration Date:   12/08/2022   Lactate dehydrogenase    Standing Status:   Future    Number of Occurrences:   1    Standing Expiration Date:   12/08/2022   Immature Platelet Fraction    Standing Status:   Future  Number of Occurrences:   1    Standing Expiration Date:   12/08/2022   Folate    Standing Status:   Future    Number of Occurrences:   1    Standing Expiration Date:   12/08/2022   Technologist smear review    Standing Status:   Future    Number of Occurrences:   1    Standing Expiration Date:   12/08/2022    Order Specific Question:   Clinical information:    Answer:   anemia thrombocytopenia   Follow-up to be determined. All questions were answered. The patient knows to call the clinic with any problems, questions or concerns.  Courtney Server, MD,  PhD [facility] Hematology Oncology 12/07/2021   REASON FOR VISIT Follow up for pancytopenia   INTERVAL HISTORY Courtney Wilcox is a 76 y.o. female who has above history reviewed by me today presents for follow up visit for management of pancytopenia. She has Rheumatoid arthritis, follows up with rheumatology. She is on Leflutamide and on plaquenil.  Patient reports progressively worsening of fatigue level, shortness of breath."  Choking " sensation of her throat. 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 She has a history of acute pulmonary embolism currently she takes Eliquis 5 mg twice daily.  Pulmonology is obtaining PFT for evaluation of her lung function. Some weight loss of 5 to 7 pounds since September.  Review of Systems  Constitutional:  Positive for fatigue. Negative for appetite change, chills and fever.  HENT:   Negative for hearing loss and voice change.   Eyes:  Negative for eye problems.  Respiratory:  Positive for shortness of breath. Negative for chest tightness and cough.   Cardiovascular:  Negative for chest pain.  Gastrointestinal:  Negative for abdominal distention and blood in stool.  Endocrine: Negative for hot flashes.  Genitourinary:  Negative for difficulty urinating and frequency.   Musculoskeletal:  Negative for arthralgias.  Skin:  Negative for itching and rash.  Neurological:  Negative for extremity weakness.  Hematological:  Negative for adenopathy.  Psychiatric/Behavioral:  Negative for confusion.       Allergies  Allergen Reactions   Lactose Other (See Comments) and Diarrhea   Other Other (See Comments)    Pt ONLY tolerates SLOW-FE (slow released iron)---upsets IBS   Biaxin [Clarithromycin] Other (See Comments)    GI upset    Hydrocodone-Acetaminophen Itching    Tolerates acetaminophen    Oxycodone Itching   Sulfa Antibiotics Other (See Comments) and Diarrhea    GI  upset  GI upset GI upset    Sulfasalazine Diarrhea     Past Medical History:  Diagnosis Date   Allergic rhinitis    Anemia    Anxiety    Arthritis    Asthma    Atrophic vaginitis    B12 deficiency    Bronchitis, chronic (HCC)    Cervical disc disease    Collagen vascular disease (HCC)    RA   Depression    Fibrocystic breast disease    Foot ulcer (HCC)    GERD (gastroesophageal reflux disease)    Hammer toe    Hyperlipidemia    IBS (irritable bowel syndrome)    Obesity    Obstructive sleep apnea    OSA (obstructive sleep apnea)    Osteoarthrosis    Multi site   Osteopenia    Rheumatoid arthritis (HCC)    Shingles    Thyroid nodule  MULTIPLE   Tonsillitis    recurrent   Umbilical hernia      Past Surgical History:  Procedure Laterality Date   BACK SURGERY  2011   LAMINECTOMY   BREAST BIOPSY Right    neg   CERVICAL FUSION  2011, 2012   X 2   COLONOSCOPY  2005, 2015   COLONOSCOPY WITH PROPOFOL N/A 08/04/2020   Procedure: COLONOSCOPY WITH PROPOFOL;  Surgeon: Robert Bellow, MD;  Location: [facility];  Service: Endoscopy;  Laterality: N/A;   ESOPHAGOGASTRODUODENOSCOPY (EGD) WITH PROPOFOL N/A 09/26/2021   Procedure: ESOPHAGOGASTRODUODENOSCOPY (EGD) WITH PROPOFOL;  Surgeon: Annamaria Helling, DO;  Location: [place];  Service: Gastroenterology;  Laterality: N/A;   ESOPHAGOGASTRODUODENOSCOPY ENDOSCOPY  2005, 2013, 2015   EYE SURGERY Bilateral    Cataract Extraction with IOL    HERNIA REPAIR     JOINT REPLACEMENT     KNEE ARTHROPLASTY Left 07/12/2016   Procedure: COMPUTER ASSISTED TOTAL KNEE ARTHROPLASTY;  Surgeon: Dereck Leep, MD;  Location: [facility];  Service: Orthopedics;  Laterality: Left;   Laryngeal tear after intubation w/repair     LUMBAR DISC SURGERY  12/2009   REPLACEMENT TOTAL KNEE Right 06/2007   DR. HOOTEN, [facility]   REPLACEMENT UNICONDYLAR JOINT KNEE     DR. HOOTEN, [facility]   SHOULDER ARTHROSCOPY WITH ROTATOR CUFF REPAIR  Right    TONSILLECTOMY     TOTAL HIP ARTHROPLASTY Right 01/31/2017   Procedure: TOTAL HIP ARTHROPLASTY;  Surgeon: Dereck Leep, MD;  Location: [facility];  Service: Orthopedics;  Laterality: Right;   TUBAL LIGATION     UPPER GI ENDOSCOPY      Social History   Socioeconomic History   Marital status: Married    Spouse name: Not on file   Number of children: Not on file   Years of education: Not on file   Highest education level: Not on file  Occupational History   Not on file  Tobacco Use   Smoking status: Never   Smokeless tobacco: Never  Vaping Use   Vaping Use: Never used  Substance and Sexual Activity   Alcohol use: No   Drug use: No   Sexual activity: Not on file  Other Topics Concern   Not on file  Social History Narrative   Not on file   Social Determinants of Health   Financial Resource Strain: Not on file  Food Insecurity: Not on file  Transportation Needs: Not on file  Physical Activity: Not on file  Stress: Not on file  Social Connections: Not on file  Intimate Partner Violence: Not on file    Family History  Problem Relation Age of Onset   Asthma Mother    Breast cancer Mother 28   Emphysema Father        smoked   Heart disease Father    Lung cancer Father        smoked     Current Outpatient Medications:    acetaminophen (TYLENOL) 500 MG tablet, Take 1,000 mg by mouth 3 (three) times daily as needed for mild pain. , Disp: , Rfl:    ALPRAZolam (XANAX) 0.5 MG tablet, Take by mouth daily as needed., Disp: , Rfl:    apixaban (ELIQUIS) 5 MG TABS tablet, Take 2 tablets (10 mg total) by mouth 2 (two) times daily. Take 5 mg two times a day from 11/26/2020, Disp: 60 tablet, Rfl: 3   budesonide (PULMICORT) 0.25 MG/2ML nebulizer solution, Inhale into the lungs., Disp: , Rfl:  Cholecalciferol (VITAMIN D) 2000 units tablet, Take 4,000 Units by mouth daily. , Disp: , Rfl:    citalopram (CELEXA) 20 MG tablet, Take 20 mg by mouth every evening. , Disp: ,  Rfl: 5   Cyanocobalamin 1000 MCG/ML KIT, Inject as directed every 30 (thirty) days., Disp: , Rfl:    estradiol (ESTRACE) 0.1 MG/GM vaginal cream, Insert pea size amount vaginally nightly x 2 weeks, then every other night x 2 weeks, then 2-3 times weekly for maintenance, Disp: , Rfl:    famotidine (PEPCID) 20 MG tablet, Take by mouth., Disp: , Rfl:    hydroxychloroquine (PLAQUENIL) 200 MG tablet, Take 1 tablet (200 mg total) by mouth daily., Disp: 1 tablet, Rfl: 0   leflunomide (ARAVA) 20 MG tablet, Take by mouth., Disp: , Rfl:    levalbuterol (XOPENEX) 0.63 MG/3ML nebulizer solution, as needed for wheezing., Disp: , Rfl:    meclizine (ANTIVERT) 12.5 MG tablet, PLEASE SEE ATTACHED FOR DETAILED DIRECTIONS, Disp: , Rfl:    metoprolol succinate (TOPROL-XL) 50 MG 24 hr tablet, Take 1 tablet (50 mg total) by mouth 2 (two) times daily after a meal. Take with or immediately following a meal., Disp: 180 tablet, Rfl: 3   montelukast (SINGULAIR) 10 MG tablet, Take 10 mg by mouth daily as needed (for allergies.). , Disp: , Rfl:    Multiple Vitamins-Minerals (CENTRUM SILVER 50+WOMEN PO), Take by mouth., Disp: , Rfl:    nystatin (MYCOSTATIN) 100000 UNIT/ML suspension, Take 5 mLs by mouth as needed., Disp: , Rfl:    pantoprazole (PROTONIX) 40 MG tablet, Take 40 mg by mouth 2 (two) times daily., Disp: , Rfl:    potassium chloride SA (KLOR-CON M) 20 MEQ tablet, Take 2 tablets (40 mEq total) by mouth every other day. With your Lasix., Disp: 90 tablet, Rfl: 3   predniSONE (DELTASONE) 10 MG tablet, Take 10 mg by mouth daily., Disp: , Rfl:    Simethicone 180 MG CAPS, Take 1-2 capsules by mouth as needed., Disp: , Rfl:    triamcinolone (NASACORT) 55 MCG/ACT AERO nasal inhaler, Place 2 sprays into the nose daily., Disp: , Rfl:    furosemide (LASIX) 40 MG tablet, Take 1 tablet (40 mg total) by mouth every other day. (Patient not taking: Reported on 12/07/2021), Disp: 45 tablet, Rfl: 3  Physical exam:  Vitals:    12/07/21 1329  BP: (!) 155/74  Pulse: (!) 55  Resp: 18  Temp: 98.7 F (37.1 C)  SpO2: 99%  Weight: 185 lb 1.6 oz (84 kg)   Physical Exam Constitutional:      General: She is not in acute distress.    Comments: She ambulates independantly  HENT:     Head: Normocephalic and atraumatic.  Eyes:     General: No scleral icterus. Cardiovascular:     Rate and Rhythm: Normal rate and regular rhythm.  Pulmonary:     Effort: Pulmonary effort is normal. No respiratory distress.     Breath sounds: No wheezing.  Abdominal:     General: There is no distension.     Palpations: Abdomen is soft.  Musculoskeletal:        General: No deformity. Normal range of motion.     Cervical back: Normal range of motion and neck supple.  Skin:    General: Skin is warm and dry.     Findings: No erythema or rash.  Neurological:     Mental Status: She is alert and oriented to person, place, and time. Mental status is  at baseline.     Cranial Nerves: No cranial nerve deficit.     Coordination: Coordination normal.  Psychiatric:        Mood and Affect: Mood normal.        Latest Ref Rng & Units 12/07/2021    2:10 PM  CMP  Glucose 70 - 99 mg/dL 80   BUN 8 - 23 mg/dL 10   Creatinine 0.44 - 1.00 mg/dL 1.12   Sodium 135 - 145 mmol/L 139   Potassium 3.5 - 5.1 mmol/L 3.3   Chloride 98 - 111 mmol/L 105   CO2 22 - 32 mmol/L 28   Calcium 8.9 - 10.3 mg/dL 8.2   Total Protein 6.5 - 8.1 g/dL 5.8   Total Bilirubin 0.3 - 1.2 mg/dL 0.7   Alkaline Phos 38 - 126 U/L 68   AST 15 - 41 U/L 30   ALT 0 - 44 U/L 20       Latest Ref Rng & Units 12/07/2021    2:10 PM  CBC  WBC 4.0 - 10.5 K/uL 5.8   Hemoglobin 12.0 - 15.0 g/dL 9.9   Hematocrit 36.0 - 46.0 % 31.8   Platelets 150 - 400 K/uL 140     RADIOGRAPHIC STUDIES: I have personally reviewed the radiological images as listed and agreed with the findings in the report. No results found.

## 2021-12-09 NOTE — Assessment & Plan Note (Signed)
Multifactorial, secondary to her chronic lung disease, anemia, may be possible drug side effects.

## 2021-12-09 NOTE — Assessment & Plan Note (Signed)
Patient is currently on leflunomide and Plaquenil. Awaiting rheumatologist to call back for further discussion of her current regimen.

## 2021-12-12 LAB — COMP PANEL: LEUKEMIA/LYMPHOMA

## 2021-12-12 LAB — MULTIPLE MYELOMA PANEL, SERUM
Albumin SerPl Elph-Mcnc: 3.2 g/dL (ref 2.9–4.4)
Albumin/Glob SerPl: 1.5 (ref 0.7–1.7)
Alpha 1: 0.2 g/dL (ref 0.0–0.4)
Alpha2 Glob SerPl Elph-Mcnc: 0.6 g/dL (ref 0.4–1.0)
B-Globulin SerPl Elph-Mcnc: 0.8 g/dL (ref 0.7–1.3)
Gamma Glob SerPl Elph-Mcnc: 0.5 g/dL (ref 0.4–1.8)
Globulin, Total: 2.2 g/dL (ref 2.2–3.9)
IgA: 123 mg/dL (ref 64–422)
IgG (Immunoglobin G), Serum: 625 mg/dL (ref 586–1602)
IgM (Immunoglobulin M), Srm: 26 mg/dL (ref 26–217)
Total Protein ELP: 5.4 g/dL — ABNORMAL LOW (ref 6.0–8.5)

## 2021-12-25 ENCOUNTER — Other Ambulatory Visit: Payer: Self-pay | Admitting: Oncology

## 2021-12-25 DIAGNOSIS — D539 Nutritional anemia, unspecified: Secondary | ICD-10-CM

## 2021-12-26 ENCOUNTER — Telehealth: Payer: Self-pay

## 2021-12-26 DIAGNOSIS — D539 Nutritional anemia, unspecified: Secondary | ICD-10-CM

## 2021-12-26 NOTE — Telephone Encounter (Signed)
Pt daughter Lyla Son) called and is concerned about pt's care at [facility] rheumatology. She wants to know if you are willing to send a referral to a new rheum for second opinion.

## 2021-12-26 NOTE — Telephone Encounter (Signed)
Pt informed of results and MD recommendations. She requests appt to be scheduled after lunch.   Please schedule and inform pt of appts:   IV venofer weekly x4 Labs in 6 weeks MD/ venofer or retacrit 1-2 days AFTER labs

## 2021-12-26 NOTE — Telephone Encounter (Signed)
-----   Message from [name], MD sent at 12/25/2021  9:09 PM EST ----- Please let her know that her iron level could be further improved, hopefully to help her blood level. Recommend IV Venofer weekly x4  Follow up in 6 weeks, labs prior to MD +/- Venofer +/- Retacrit. Thanks.

## 2021-12-29 ENCOUNTER — Encounter: Payer: Self-pay | Admitting: Oncology

## 2021-12-29 NOTE — Telephone Encounter (Signed)
Dr. Cathie Hoops, do you have an update?

## 2022-01-02 ENCOUNTER — Inpatient Hospital Stay: Payer: Medicare Other | Attending: Oncology

## 2022-01-02 VITALS — BP 132/95 | HR 68 | Temp 97.2°F | Resp 18

## 2022-01-02 DIAGNOSIS — D631 Anemia in chronic kidney disease: Secondary | ICD-10-CM | POA: Insufficient documentation

## 2022-01-02 DIAGNOSIS — N189 Chronic kidney disease, unspecified: Secondary | ICD-10-CM | POA: Diagnosis not present

## 2022-01-02 MED ORDER — SODIUM CHLORIDE 0.9 % IV SOLN
Freq: Once | INTRAVENOUS | Status: AC
  Filled 2022-01-02: qty 250

## 2022-01-02 MED ORDER — SODIUM CHLORIDE 0.9 % IV SOLN
200.0000 mg | Freq: Once | INTRAVENOUS | Status: AC
  Administered 2022-01-02: 200 mg via INTRAVENOUS
  Filled 2022-01-02: qty 200

## 2022-01-02 NOTE — Patient Instructions (Signed)

## 2022-01-03 ENCOUNTER — Institutional Professional Consult (permissible substitution): Payer: Medicare Other | Admitting: Pulmonary Disease

## 2022-01-04 ENCOUNTER — Telehealth: Payer: Self-pay | Admitting: Cardiology

## 2022-01-04 DIAGNOSIS — I2699 Other pulmonary embolism without acute cor pulmonale: Secondary | ICD-10-CM

## 2022-01-04 NOTE — Telephone Encounter (Signed)
*  STAT* If patient is at the pharmacy, call can be transferred to refill team.   1. Which medications need to be refilled? (please list name of each medication and dose if known)   apixaban (ELIQUIS) 5 MG TABS tablet   2. Which pharmacy/location (including street and city if local pharmacy) is medication to be sent to?  [id number] - [place], [place] - [address]   3. Do they need a 30 day or 90 day supply?  30 days  Daughter stated the patient is completely out of this medication.

## 2022-01-05 MED ORDER — APIXABAN 5 MG PO TABS: 5.0000 mg | ORAL_TABLET | Freq: Two times a day (BID) | ORAL | 2 refills | Status: DC

## 2022-01-05 NOTE — Telephone Encounter (Signed)
Prescription refill request for Eliquis received. Indication: DVT/PE Last office visit: 11/02/21 Lalla Brothers)  Scr: 1.12 (12/07/21)  Age: 76 Weight: 84kg  Appropriate dose and refill sent to requested pharmacy.

## 2022-01-05 NOTE — Telephone Encounter (Signed)
Refill Request.  

## 2022-01-11 ENCOUNTER — Inpatient Hospital Stay: Payer: Medicare Other

## 2022-01-11 VITALS — BP 139/61 | HR 60 | Temp 98.2°F | Resp 16

## 2022-01-11 DIAGNOSIS — N189 Chronic kidney disease, unspecified: Secondary | ICD-10-CM | POA: Diagnosis not present

## 2022-01-11 DIAGNOSIS — D631 Anemia in chronic kidney disease: Secondary | ICD-10-CM

## 2022-01-11 MED ORDER — EPOETIN ALFA-EPBX 20000 UNIT/ML IJ SOLN: 20000.0000 [IU] | Freq: Once | INTRAMUSCULAR | Status: DC

## 2022-01-11 MED ORDER — SODIUM CHLORIDE 0.9 % IV SOLN
Freq: Once | INTRAVENOUS | Status: AC
  Filled 2022-01-11: qty 250

## 2022-01-11 MED ORDER — SODIUM CHLORIDE 0.9 % IV SOLN
200.0000 mg | Freq: Once | INTRAVENOUS | Status: AC
  Administered 2022-01-11: 200 mg via INTRAVENOUS
  Filled 2022-01-11: qty 200

## 2022-01-11 NOTE — Patient Instructions (Signed)

## 2022-01-17 ENCOUNTER — Inpatient Hospital Stay: Payer: Medicare Other | Attending: Oncology

## 2022-01-17 VITALS — BP 149/79 | HR 59 | Temp 97.5°F | Resp 17

## 2022-01-17 DIAGNOSIS — N189 Chronic kidney disease, unspecified: Secondary | ICD-10-CM | POA: Diagnosis not present

## 2022-01-17 DIAGNOSIS — D631 Anemia in chronic kidney disease: Secondary | ICD-10-CM | POA: Insufficient documentation

## 2022-01-17 DIAGNOSIS — N183 Chronic kidney disease, stage 3 unspecified: Secondary | ICD-10-CM

## 2022-01-17 MED ORDER — SODIUM CHLORIDE 0.9 % IV SOLN
Freq: Once | INTRAVENOUS | Status: AC
  Filled 2022-01-17: qty 250

## 2022-01-17 MED ORDER — SODIUM CHLORIDE 0.9 % IV SOLN
200.0000 mg | Freq: Once | INTRAVENOUS | Status: AC
  Administered 2022-01-17: 200 mg via INTRAVENOUS
  Filled 2022-01-17: qty 200

## 2022-01-19 ENCOUNTER — Ambulatory Visit: Payer: Medicare Other | Attending: Internal Medicine | Admitting: Internal Medicine

## 2022-01-19 ENCOUNTER — Encounter: Payer: Self-pay | Admitting: Internal Medicine

## 2022-01-19 VITALS — BP 100/58 | HR 54 | Ht 62.0 in | Wt 185.0 lb

## 2022-01-19 DIAGNOSIS — R0989 Other specified symptoms and signs involving the circulatory and respiratory systems: Secondary | ICD-10-CM

## 2022-01-19 DIAGNOSIS — I5022 Chronic systolic (congestive) heart failure: Secondary | ICD-10-CM

## 2022-01-19 DIAGNOSIS — Z86711 Personal history of pulmonary embolism: Secondary | ICD-10-CM

## 2022-01-19 DIAGNOSIS — I471 Supraventricular tachycardia, unspecified: Secondary | ICD-10-CM | POA: Diagnosis not present

## 2022-01-19 DIAGNOSIS — R0602 Shortness of breath: Secondary | ICD-10-CM | POA: Diagnosis not present

## 2022-01-19 DIAGNOSIS — M79606 Pain in leg, unspecified: Secondary | ICD-10-CM

## 2022-01-19 MED ORDER — METOPROLOL SUCCINATE ER 50 MG PO TB24: 50.0000 mg | ORAL_TABLET | Freq: Every day | ORAL | 3 refills | Status: DC

## 2022-01-19 NOTE — Progress Notes (Signed)
New Outpatient Visit Date: 01/19/2022  Primary Care provider: Marinda Elk, MD [facility] [place],  [place] [address]  Chief Complaint: Shortness of breath  HPI:  Courtney Wilcox is a 77 y.o. female who is being seen today for the evaluation of shortness of breath. She has a history of evaluation of supraventricular tachycardia, pulmonary embolism (11/2020), rheumatoid arthritis, and obstructive sleep apnea.  She was previously followed at [facility] clinic by Dr. Corky Sox and has also seen Dr. Quentin Ore in our practice for EP evaluation.  She last saw Dr. Quentin Ore in October, at which time she complained of severe shortness of breath and wheezing, only minimally responsive to her inhaled therapies.  It was felt that her SVT burden was low and not contributing to her dyspnea.  She was referred to [facility] pulmonary for a second opinion regarding her shortness of breath.  Today, Courtney Wilcox reports that she has had progressive shortness of breath for over a year.  It is present with minimal activity such as walking from our waiting room to the exam room.  She also wakes up at night short of breath at times.  She typically sleeps on 2-3 pillows, which is stable.  She and her daughter both noticed persistent wheezing.  She has been on multiple treatments for her asthma and has not noticed much improvement.  She was recently started on dupilumab by Dr. Glenis Smoker off.  She has also started iron infusions through Dr. Tasia Catchings for treatment of her iron deficiency anemia.  Courtney Wilcox has not noticed much improvement, though she believes not enough time has elapsed to see a response.  Courtney Wilcox has occasional chest pain localized to the left upper side just below the axilla.  It is tender and does not worsen with activity.  She has not had any palpitations, lightheadedness, or edema.  She is concerned about PAD because her legs often feel weak.  She also notes cramping in her calves, usually at  the Kamya Watling of the day.  --------------------------------------------------------------------------------------------------  Cardiovascular History & Procedures: Cardiovascular Problems: Supraventricular tachycardia Shortness of breath  Risk Factors: Age greater than 105, family history, and obesity  Cath/PCI: LHC (04/16/2003): Normal coronary arteries LVEF 60-65%.  LVEDP 17 mmHg.  CV Surgery: None  EP Procedures and Devices: None  Non-Invasive Evaluation(s): TTE (10/31/2021): Normal LV size with mild LVH.  LVEF 60-65% with grade 2 diastolic dysfunction.  Normal RV size and function.  Severe left atrial enlargement.  Mild mitral regurgitation.  Trivial tricuspid regurgitation.  Normal aortic valve.  Normal CVP.  Recent CV Pertinent Labs: Lab Results  Component Value Date   INR 1.0 11/19/2020   BNP 181.1 (H) 08/24/2021   K 3.3 (L) 12/07/2021   MG 1.9 07/13/2021   BUN 10 12/07/2021   CREATININE 1.12 (H) 12/07/2021    --------------------------------------------------------------------------------------------------  Past Medical History:  Diagnosis Date   Allergic rhinitis    Anemia    Anxiety    Arthritis    Asthma    Atrophic vaginitis    B12 deficiency    Bronchitis, chronic (HCC)    Cervical disc disease    Collagen vascular disease (HCC)    RA   Depression    Fibrocystic breast disease    Foot ulcer (HCC)    GERD (gastroesophageal reflux disease)    Hammer toe    Hyperlipidemia    IBS (irritable bowel syndrome)    Obesity    Obstructive sleep apnea  OSA (obstructive sleep apnea)    Osteoarthrosis    Multi site   Osteopenia    Rheumatoid arthritis ([place])    Shingles    Thyroid nodule    MULTIPLE   Tonsillitis    recurrent   Umbilical hernia     Past Surgical History:  Procedure Laterality Date   BACK SURGERY  2011   LAMINECTOMY   BREAST BIOPSY Right    neg   CERVICAL FUSION  2011, 2012   X 2   COLONOSCOPY  2005, 2015   COLONOSCOPY WITH  PROPOFOL N/A 08/04/2020   Procedure: COLONOSCOPY WITH PROPOFOL;  Surgeon: Robert Bellow, MD;  Location: [facility];  Service: Endoscopy;  Laterality: N/A;   ESOPHAGOGASTRODUODENOSCOPY (EGD) WITH PROPOFOL N/A 09/26/2021   Procedure: ESOPHAGOGASTRODUODENOSCOPY (EGD) WITH PROPOFOL;  Surgeon: Annamaria Helling, DO;  Location: [facility];  Service: Gastroenterology;  Laterality: N/A;   ESOPHAGOGASTRODUODENOSCOPY ENDOSCOPY  2005, 2013, 2015   EYE SURGERY Bilateral    Cataract Extraction with IOL    HERNIA REPAIR     JOINT REPLACEMENT     KNEE ARTHROPLASTY Left 07/12/2016   Procedure: COMPUTER ASSISTED TOTAL KNEE ARTHROPLASTY;  Surgeon: Dereck Leep, MD;  Location: [facility];  Service: Orthopedics;  Laterality: Left;   Laryngeal tear after intubation w/repair     LUMBAR DISC SURGERY  12/2009   REPLACEMENT TOTAL KNEE Right 06/2007   DR. HOOTEN, [facility]   REPLACEMENT UNICONDYLAR JOINT KNEE     DR. HOOTEN, [facility]   SHOULDER ARTHROSCOPY WITH ROTATOR CUFF REPAIR Right    TONSILLECTOMY     TOTAL HIP ARTHROPLASTY Right 01/31/2017   Procedure: TOTAL HIP ARTHROPLASTY;  Surgeon: Dereck Leep, MD;  Location: [facility];  Service: Orthopedics;  Laterality: Right;   TUBAL LIGATION     UPPER GI ENDOSCOPY      Current Meds  Medication Sig   acetaminophen (TYLENOL) 500 MG tablet Take 1,000 mg by mouth 3 (three) times daily as needed for mild pain.    ALPRAZolam (XANAX) 0.5 MG tablet Take by mouth daily as needed.   apixaban (ELIQUIS) 5 MG TABS tablet Take 1 tablet (5 mg total) by mouth 2 (two) times daily.   budesonide (PULMICORT) 0.25 MG/2ML nebulizer solution Take 0.25 mg by nebulization daily.   Cholecalciferol (VITAMIN D) 2000 units tablet Take 4,000 Units by mouth daily.    citalopram (CELEXA) 20 MG tablet Take 20 mg by mouth every evening.    Cyanocobalamin 1000 MCG/ML KIT Inject as directed every 30 (thirty) days.   Dupilumab 300 MG/2ML SOPN Inject 4 mLs into the skin every 14  (fourteen) days.   EPINEPHrine 0.3 mg/0.3 mL IJ SOAJ injection Inject 0.3 mg into the muscle as needed.   estradiol (ESTRACE) 0.1 MG/GM vaginal cream Insert pea size amount vaginally nightly x 2 weeks, then every other night x 2 weeks, then 2-3 times weekly for maintenance   famotidine (PEPCID) 20 MG tablet Take by mouth daily as needed.   hydroxychloroquine (PLAQUENIL) 200 MG tablet Take 1 tablet (200 mg total) by mouth daily.   leflunomide (ARAVA) 20 MG tablet Take 10 mg by mouth daily.   levalbuterol (XOPENEX) 0.63 MG/3ML nebulizer solution as needed for wheezing.   meclizine (ANTIVERT) 12.5 MG tablet PLEASE SEE ATTACHED FOR DETAILED DIRECTIONS   metoprolol succinate (TOPROL-XL) 50 MG 24 hr tablet Take 1 tablet (50 mg total) by mouth 2 (two) times daily after a meal. Take with or immediately following a meal.   montelukast (  SINGULAIR) 10 MG tablet Take 10 mg by mouth daily as needed (for allergies.).    nystatin (MYCOSTATIN) 100000 UNIT/ML suspension Take 5 mLs by mouth as needed.   pantoprazole (PROTONIX) 40 MG tablet Take 40 mg by mouth 2 (two) times daily.   potassium chloride SA (KLOR-CON M) 20 MEQ tablet Take 2 tablets (40 mEq total) by mouth every other day. With your Lasix.   predniSONE (DELTASONE) 10 MG tablet Take 10 mg by mouth daily.   Simethicone 180 MG CAPS Take 1-2 capsules by mouth as needed.   triamcinolone (NASACORT) 55 MCG/ACT AERO nasal inhaler Place 2 sprays into the nose daily.    Allergies: Lactose, Other, Biaxin [clarithromycin], Hydrocodone-acetaminophen, Oxycodone, Sulfa antibiotics, and Sulfasalazine  Social History   Tobacco Use   Smoking status: Never   Smokeless tobacco: Never  Vaping Use   Vaping Use: Never used  Substance Use Topics   Alcohol use: No   Drug use: No    Family History  Problem Relation Age of Onset   Asthma Mother    Breast cancer Mother 26   Emphysema Father        smoked   Lung cancer Father        smoked   Coronary artery  disease Father 35    Review of Systems: A 12-system review of systems was performed and was negative except as noted in the HPI.  --------------------------------------------------------------------------------------------------  Physical Exam: BP (!) 100/58 (BP Location: Right Arm, Patient Position: Sitting, Cuff Size: Large)   Pulse (!) 54   Ht _0  (1.575 m)   Wt 185 lb (83.9 kg)   SpO2 97%   BMI 33.84 kg/m   General: NAD.  Accompanied by her daughter. HEENT: Conjunctival pallor noted.  No scleral icterus. Neck: Supple without lymphadenopathy, thyromegaly, JVD, or HJR. No carotid bruit. Lungs: Normal work of breathing.  Mildly Minich breath sounds throughout with scattered wheezes.  No crackles. Heart: Regular rate and rhythm without murmurs, rubs, or gallops. Non-displaced PMI. Abd: Bowel sounds present. Soft, NT/ND without hepatosplenomegaly Ext: Trace left pretibial edema noted with varicose veins present.  1+ radial and dorsalis pedis pulses bilaterally.  Posterior tibial pulses trace bilaterally. Skin: Warm and dry without rash. Neuro: CNIII-XII intact. Strength and fine-touch sensation intact in upper and lower extremities bilaterally. Psych: Normal mood and affect.  EKG: Sinus bradycardia with sinus arrhythmia and nonspecific ST segment changes.  Lab Results  Component Value Date   WBC 5.8 12/07/2021   HGB 9.9 (L) 12/07/2021   HCT 31.8 (L) 12/07/2021   MCV 102.6 (H) 12/07/2021   PLT 140 (L) 12/07/2021    Lab Results  Component Value Date   NA 139 12/07/2021   K 3.3 (L) 12/07/2021   CL 105 12/07/2021   CO2 28 12/07/2021   BUN 10 12/07/2021   CREATININE 1.12 (H) 12/07/2021   GLUCOSE 80 12/07/2021   ALT 20 12/07/2021    --------------------------------------------------------------------------------------------------  ASSESSMENT AND PLAN: Shortness of breath: This has been a longstanding problem for Ms. Courtney Wilcox and is likely multifactorial.  It is  suspected that her underlying asthma is playing a role.  She should continue to follow closely with Dr. Lanney Gins of for management of this.  Her anemia may also be playing a role; hopefully this will improve with iron infusions managed by Dr. Tasia Catchings.  Her recent echocardiogram showed preserved LVEF with grade 2 diastolic dysfunction and mild MR.  This may be playing a role in her dyspnea, though degree  of shortness of breath seems to be out of proportion to the echo findings.  She is at risk for pulmonary hypertension in the setting of her prior PEs, though they were fairly small.  If her dyspnea does not improve with iron therapy and optimization of her asthma treatment, I think it would be prudent to move forward with right and left heart catheterization to better assess her hemodynamics and also exclude underlying CAD.  In the meantime, we have agreed to decrease metoprolol succinate from 50 mg twice daily to 50 mg daily, as bradycardia and soft blood pressure could be contributing to some of her dyspnea and fatigue.  SVT: EKG today shows sinus bradycardia.  As above, we will decrease metoprolol to succinate to 50 mg daily.  Continue follow-up with Dr. Quentin Ore.  History of pulmonary embolism: Continue apixaban 5 mg twice daily with ongoing management per Drs. Aleskerov and Yu.  Leg pain and decreased pedal pulses: Description of leg pain is not typical for claudication.  However, peripheral pulses are decreased on exam today.  We have agreed to obtain ABIs for further evaluation.  Follow-up: Return to clinic in 3 months.  Nelva Bush, MD 01/19/2022 3:56 PM

## 2022-01-19 NOTE — Patient Instructions (Signed)
Medication Instructions:  Your physician recommends the following medication changes.  DECREASE: Metoprolol Succinate to 50 mg by mouth once daily    *If you need a refill on your cardiac medications before your next appointment, please call your pharmacy*   Lab Work: None ordered today   Testing/Procedures: Your physician has requested that you have an ankle brachial index (ABI). During this test an ultrasound and blood pressure cuff are used to evaluate the arteries that supply the arms and legs with blood.  Allow thirty minutes for this exam.  There are no restrictions or special instructions.  This will take place at [place] ([place]) [id number], [place]    Follow-Up: At [facility], you and your health needs are our priority.  As part of our continuing mission to provide you with exceptional heart care, we have created designated Provider Care Teams.  These Care Teams include your primary Cardiologist (physician) and Advanced Practice Providers (APPs -  Physician Assistants and Nurse Practitioners) who all work together to provide you with the care you need, when you need it.  We recommend signing up for the patient portal called "MyChart".  Sign up information is provided on this After Visit Summary.  MyChart is used to connect with patients for Virtual Visits (Telemedicine).  Patients are able to view lab/test results, encounter notes, upcoming appointments, etc.  Non-urgent messages can be sent to your provider as well.   To learn more about what you can do with MyChart, go to [email or web address].    Your next appointment:   3 month(s)  The format for your next appointment:   In Person  Provider:   You may see Nelva Bush, MD or one of the following Advanced Practice Providers on your designated Care Team:   Murray Hodgkins, NP Christell Faith, PA-C Cadence Kathlen Mody, PA-C Gerrie Nordmann, NP

## 2022-01-20 ENCOUNTER — Encounter: Payer: Self-pay | Admitting: Internal Medicine

## 2022-01-20 ENCOUNTER — Encounter: Payer: Self-pay | Admitting: Oncology

## 2022-01-20 DIAGNOSIS — Z86711 Personal history of pulmonary embolism: Secondary | ICD-10-CM | POA: Insufficient documentation

## 2022-01-23 ENCOUNTER — Other Ambulatory Visit: Payer: Self-pay | Admitting: Internal Medicine

## 2022-01-23 ENCOUNTER — Encounter: Payer: Self-pay | Admitting: Oncology

## 2022-01-23 ENCOUNTER — Inpatient Hospital Stay: Payer: Medicare Other

## 2022-01-23 VITALS — BP 133/74 | HR 59 | Temp 98.1°F | Resp 18

## 2022-01-23 DIAGNOSIS — D631 Anemia in chronic kidney disease: Secondary | ICD-10-CM

## 2022-01-23 DIAGNOSIS — N189 Chronic kidney disease, unspecified: Secondary | ICD-10-CM | POA: Diagnosis not present

## 2022-01-23 DIAGNOSIS — R0989 Other specified symptoms and signs involving the circulatory and respiratory systems: Secondary | ICD-10-CM

## 2022-01-23 MED ORDER — SODIUM CHLORIDE 0.9 % IV SOLN
200.0000 mg | Freq: Once | INTRAVENOUS | Status: AC
  Administered 2022-01-23: 200 mg via INTRAVENOUS
  Filled 2022-01-23: qty 200

## 2022-01-23 MED ORDER — SODIUM CHLORIDE 0.9 % IV SOLN
Freq: Once | INTRAVENOUS | Status: AC
  Filled 2022-01-23: qty 250

## 2022-02-09 ENCOUNTER — Inpatient Hospital Stay: Payer: Medicare Other

## 2022-02-09 DIAGNOSIS — D539 Nutritional anemia, unspecified: Secondary | ICD-10-CM

## 2022-02-09 DIAGNOSIS — N189 Chronic kidney disease, unspecified: Secondary | ICD-10-CM | POA: Diagnosis not present

## 2022-02-09 LAB — CBC WITH DIFFERENTIAL/PLATELET
Abs Immature Granulocytes: 0.05 10*3/uL (ref 0.00–0.07)
Basophils Absolute: 0.1 10*3/uL (ref 0.0–0.1)
Basophils Relative: 1 %
Eosinophils Absolute: 0.2 10*3/uL (ref 0.0–0.5)
Eosinophils Relative: 4 %
HCT: 33.5 % — ABNORMAL LOW (ref 36.0–46.0)
Hemoglobin: 10.1 g/dL — ABNORMAL LOW (ref 12.0–15.0)
Immature Granulocytes: 1 %
Lymphocytes Relative: 26 %
Lymphs Abs: 1.4 10*3/uL (ref 0.7–4.0)
MCH: 31.9 pg (ref 26.0–34.0)
MCHC: 30.1 g/dL (ref 30.0–36.0)
MCV: 105.7 fL — ABNORMAL HIGH (ref 80.0–100.0)
Monocytes Absolute: 0.6 10*3/uL (ref 0.1–1.0)
Monocytes Relative: 11 %
Neutro Abs: 3.1 10*3/uL (ref 1.7–7.7)
Neutrophils Relative %: 57 %
Platelets: 127 10*3/uL — ABNORMAL LOW (ref 150–400)
RBC: 3.17 MIL/uL — ABNORMAL LOW (ref 3.87–5.11)
RDW: 13.7 % (ref 11.5–15.5)
WBC: 5.5 10*3/uL (ref 4.0–10.5)
nRBC: 0 % (ref 0.0–0.2)

## 2022-02-09 LAB — IRON AND TIBC
Iron: 82 ug/dL (ref 28–170)
Saturation Ratios: 32 % — ABNORMAL HIGH (ref 10.4–31.8)
TIBC: 258 ug/dL (ref 250–450)
UIBC: 176 ug/dL

## 2022-02-09 LAB — RETIC PANEL
Immature Retic Fract: 6.9 % (ref 2.3–15.9)
RBC.: 3.15 MIL/uL — ABNORMAL LOW (ref 3.87–5.11)
Retic Count, Absolute: 34.3 10*3/uL (ref 19.0–186.0)
Retic Ct Pct: 1.1 % (ref 0.4–3.1)
Reticulocyte Hemoglobin: 33 pg (ref >27.9)

## 2022-02-09 LAB — FERRITIN: Ferritin: 169 ng/mL (ref 11–307)

## 2022-02-10 MED FILL — Iron Sucrose Inj 20 MG/ML (Fe Equiv): INTRAVENOUS | Qty: 10 | Status: AC

## 2022-02-13 ENCOUNTER — Inpatient Hospital Stay: Payer: Medicare Other

## 2022-02-13 ENCOUNTER — Inpatient Hospital Stay (HOSPITAL_BASED_OUTPATIENT_CLINIC_OR_DEPARTMENT_OTHER): Payer: Medicare Other | Admitting: Oncology

## 2022-02-13 ENCOUNTER — Encounter: Payer: Self-pay | Admitting: Oncology

## 2022-02-13 VITALS — BP 123/80 | HR 114 | Temp 96.2°F | Resp 20 | Ht 62.0 in | Wt 187.8 lb

## 2022-02-13 DIAGNOSIS — D631 Anemia in chronic kidney disease: Secondary | ICD-10-CM

## 2022-02-13 DIAGNOSIS — D539 Nutritional anemia, unspecified: Secondary | ICD-10-CM | POA: Diagnosis not present

## 2022-02-13 DIAGNOSIS — D696 Thrombocytopenia, unspecified: Secondary | ICD-10-CM

## 2022-02-13 DIAGNOSIS — M069 Rheumatoid arthritis, unspecified: Secondary | ICD-10-CM | POA: Diagnosis not present

## 2022-02-13 DIAGNOSIS — N189 Chronic kidney disease, unspecified: Secondary | ICD-10-CM | POA: Diagnosis not present

## 2022-02-13 DIAGNOSIS — N183 Chronic kidney disease, stage 3 unspecified: Secondary | ICD-10-CM | POA: Diagnosis not present

## 2022-02-13 DIAGNOSIS — R0602 Shortness of breath: Secondary | ICD-10-CM | POA: Diagnosis not present

## 2022-02-13 NOTE — Assessment & Plan Note (Addendum)
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 Patient has normal B12 and folate level, check CBC, CMP, iron, TIBC ferritin, reticulocyte panel, CMP, myeloma panel, peripheral blood flow cytometry, LDH, immature platelet fraction, folate,   I suspect this is either secondary to the marrow suppression due to by rheumatology arthritis medication [Leflutamide, Plaquenil] versus underlying bone marrow disorders. Leflutamide has been decreased to 10mg  for about 4 weeks, no further decrease of hemoglobin. Hemoglobin has slightly increased to 10.1 Given the long half life of leflutamide, I recommend to continue observation, repeat cbc in 4 weeks.  If her hemoglobin drops further, recommend bone marrow biopsy .   There is also likely a component of anemia secondary to chronic kidney disease. S/p IV venofer weekly x 4, iron panel has improved. Hb is >10, no need for retacrit.

## 2022-02-13 NOTE — Assessment & Plan Note (Signed)
Chronic thrombocytopenia, mild.  Previous workup was negative. Maybe due to drug side effects vs underlying bone marrow disorders.

## 2022-02-13 NOTE — Progress Notes (Signed)
Hematology/Oncology Follow Up Note [facility]  [phone] [phone]  Patient Care Team: Marinda Elk, MD as PCP - General (Physician Assistant) Vickie Epley, MD as PCP - Electrophysiology (Cardiology) End, Harrell Gave, MD as PCP - Cardiology (Cardiology)   ASSESSMENT & PLAN:   Macrocytic anemia Microcytic anemia, this is a chronic issue for her. Patient has normal B12 and folate level, check CBC, CMP, iron, TIBC ferritin, reticulocyte panel, CMP, myeloma panel, peripheral blood flow cytometry, LDH, immature platelet fraction, folate,   I suspect this is either secondary to the marrow suppression due to by rheumatology arthritis medication [Leflutamide, Plaquenil] versus underlying bone marrow disorders. Leflutamide has been decreased to 10mg  for about 4 weeks, no further decrease of hemoglobin. Hemoglobin has slightly increased to 10.1 Given the long half life of leflutamide, I recommend to continue observation, repeat cbc in 4 weeks.  If her hemoglobin drops further, recommend bone marrow biopsy .   There is also likely a component of anemia secondary to chronic kidney disease. S/p IV venofer weekly x 4, iron panel has improved. Hb is >10, no need for retacrit.     Rheumatoid arthritis ([place]) Patient is currently on leflunomide and Plaquenil. Continue follow up with rheumatology  Shortness of breath Multifactorial, secondary to her chronic lung disease, anemia Worse of SOB today. Tachycardia.  Recommend CT chest angiogram. She would like to defer for now, she wants to  further discuss with her pulmonology and decide.  I recommend patient to go to ER if SOB persists.   Thrombocytopenia (HCC) Chronic thrombocytopenia, mild.  Previous workup was negative. Maybe due to drug side effects vs underlying bone marrow disorders.    Orders Placed This Encounter  Procedures   CBC with Differential/Platelet    Standing  Status:   Future    Standing Expiration Date:   02/14/2023   Ferritin    Standing Status:   Future    Standing Expiration Date:   02/14/2023   Iron and TIBC    Standing Status:   Future    Standing Expiration Date:   02/14/2023   CBC with Differential/Platelet    Standing Status:   Future    Standing Expiration Date:   02/14/2023   Follow-up  Lab in 4 weeks Follow up in 3 months.  All questions were answered. The patient knows to call the clinic with any problems, questions or concerns.  Earlie Server, MD, PhD [facility] Hematology Oncology 02/13/2022   REASON FOR VISIT Follow up for pancytopenia   INTERVAL HISTORY Courtney Wilcox is a 77 y.o. female who has above history reviewed by me today presents for follow up visit for management of pancytopenia. She has Rheumatoid arthritis, follows up with rheumatology She is on Leflutamide and on plaquenil. Leflutamide dose has been decreased to 10mg  in December 2023.  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 She has a history of acute pulmonary embolism,  she takes Eliquis 5 mg twice daily and reports being complaint.  She reports that her SOB has been better, however today SOB Is worse.  HR is 114 in the clinic. No chest pain.   Review of Systems  Constitutional:  Positive for fatigue. Negative for appetite change, chills and fever.  HENT:   Negative for hearing loss and voice change.   Eyes:  Negative for eye problems.  Respiratory:  Positive for shortness of breath. Negative for chest tightness and cough.  Cardiovascular:  Negative for chest pain.  Gastrointestinal:  Negative for abdominal distention and blood in stool.  Endocrine: Negative for hot flashes.  Genitourinary:  Negative for difficulty urinating and frequency.   Musculoskeletal:  Negative for arthralgias.  Skin:  Negative for itching and rash.  Neurological:  Negative for extremity weakness.   Hematological:  Negative for adenopathy.  Psychiatric/Behavioral:  Negative for confusion.       Allergies  Allergen Reactions   Lactose Other (See Comments) and Diarrhea   Other Other (See Comments)    Pt ONLY tolerates SLOW-FE (slow released iron)---upsets IBS   Biaxin [Clarithromycin] Other (See Comments)    GI upset    Hydrocodone-Acetaminophen Itching    Tolerates acetaminophen    Oxycodone Itching   Sulfa Antibiotics Other (See Comments) and Diarrhea    GI upset  GI upset GI upset    Sulfasalazine Diarrhea     Past Medical History:  Diagnosis Date   Allergic rhinitis    Anemia    Anxiety    Arthritis    Asthma    Atrophic vaginitis    B12 deficiency    Bronchitis, chronic (HCC)    Cervical disc disease    Collagen vascular disease (HCC)    RA   Depression    Fibrocystic breast disease    Foot ulcer (HCC)    GERD (gastroesophageal reflux disease)    Hammer toe    Hyperlipidemia    IBS (irritable bowel syndrome)    Obesity    Obstructive sleep apnea    OSA (obstructive sleep apnea)    Osteoarthrosis    Multi site   Osteopenia    Rheumatoid arthritis (HCC)    Shingles    Thyroid nodule    MULTIPLE   Tonsillitis    recurrent   Umbilical hernia      Past Surgical History:  Procedure Laterality Date   BACK SURGERY  2011   LAMINECTOMY   BREAST BIOPSY Right    neg   CERVICAL FUSION  2011, 2012   X 2   COLONOSCOPY  2005, 2015   COLONOSCOPY WITH PROPOFOL N/A 08/04/2020   Procedure: COLONOSCOPY WITH PROPOFOL;  Surgeon: Earline Mayotte, MD;  Location: [facility] ENDOSCOPY;  Service: Endoscopy;  Laterality: N/A;   ESOPHAGOGASTRODUODENOSCOPY (EGD) WITH PROPOFOL N/A 09/26/2021   Procedure: ESOPHAGOGASTRODUODENOSCOPY (EGD) WITH PROPOFOL;  Surgeon: Jaynie Collins, DO;  Location: [facility] ENDOSCOPY;  Service: Gastroenterology;  Laterality: N/A;   ESOPHAGOGASTRODUODENOSCOPY ENDOSCOPY  2005, 2013, 2015   EYE SURGERY Bilateral    Cataract Extraction with  IOL    HERNIA REPAIR     JOINT REPLACEMENT     KNEE ARTHROPLASTY Left 07/12/2016   Procedure: COMPUTER ASSISTED TOTAL KNEE ARTHROPLASTY;  Surgeon: Donato Heinz, MD;  Location: [facility] ORS;  Service: Orthopedics;  Laterality: Left;   Laryngeal tear after intubation w/repair     LUMBAR DISC SURGERY  12/2009   REPLACEMENT TOTAL KNEE Right 06/2007   DR. HOOTEN, [facility]   REPLACEMENT UNICONDYLAR JOINT KNEE     DR. HOOTEN, [facility]   SHOULDER ARTHROSCOPY WITH ROTATOR CUFF REPAIR Right    TONSILLECTOMY     TOTAL HIP ARTHROPLASTY Right 01/31/2017   Procedure: TOTAL HIP ARTHROPLASTY;  Surgeon: Donato Heinz, MD;  Location: [facility] ORS;  Service: Orthopedics;  Laterality: Right;   TUBAL LIGATION     UPPER GI ENDOSCOPY      Social History   Socioeconomic History   Marital status: Married    Spouse  name: Not on file   Number of children: Not on file   Years of education: Not on file   Highest education level: Not on file  Occupational History   Not on file  Tobacco Use   Smoking status: Never   Smokeless tobacco: Never  Vaping Use   Vaping Use: Never used  Substance and Sexual Activity   Alcohol use: No   Drug use: No   Sexual activity: Not on file  Other Topics Concern   Not on file  Social History Narrative   Not on file   Social Determinants of Health   Financial Resource Strain: Not on file  Food Insecurity: Not on file  Transportation Needs: Not on file  Physical Activity: Not on file  Stress: Not on file  Social Connections: Not on file  Intimate Partner Violence: Not on file    Family History  Problem Relation Age of Onset   Asthma Mother    Breast cancer Mother 52   Emphysema Father        smoked   Lung cancer Father        smoked   Coronary artery disease Father 47     Current Outpatient Medications:    acetaminophen (TYLENOL) 500 MG tablet, Take 1,000 mg by mouth 3 (three) times daily as needed for mild pain. , Disp: , Rfl:    ALPRAZolam (XANAX) 0.5 MG  tablet, Take by mouth daily as needed., Disp: , Rfl:    apixaban (ELIQUIS) 5 MG TABS tablet, Take 1 tablet (5 mg total) by mouth 2 (two) times daily., Disp: 60 tablet, Rfl: 2   budesonide (PULMICORT) 0.25 MG/2ML nebulizer solution, Take 0.25 mg by nebulization daily., Disp: , Rfl:    Cholecalciferol (VITAMIN D) 2000 units tablet, Take 4,000 Units by mouth daily. , Disp: , Rfl:    citalopram (CELEXA) 20 MG tablet, Take 20 mg by mouth every evening. , Disp: , Rfl: 5   Cyanocobalamin 1000 MCG/ML KIT, Inject as directed every 30 (thirty) days., Disp: , Rfl:    Dupilumab 300 MG/2ML SOPN, Inject 4 mLs into the skin every 14 (fourteen) days., Disp: , Rfl:    EPINEPHrine 0.3 mg/0.3 mL IJ SOAJ injection, Inject 0.3 mg into the muscle as needed., Disp: , Rfl:    estradiol (ESTRACE) 0.1 MG/GM vaginal cream, Insert pea size amount vaginally nightly x 2 weeks, then every other night x 2 weeks, then 2-3 times weekly for maintenance, Disp: , Rfl:    famotidine (PEPCID) 20 MG tablet, Take by mouth daily as needed., Disp: , Rfl:    hydroxychloroquine (PLAQUENIL) 200 MG tablet, Take 1 tablet (200 mg total) by mouth daily., Disp: 1 tablet, Rfl: 0   leflunomide (ARAVA) 20 MG tablet, Take 10 mg by mouth daily., Disp: , Rfl:    levalbuterol (XOPENEX) 0.63 MG/3ML nebulizer solution, as needed for wheezing., Disp: , Rfl:    meclizine (ANTIVERT) 12.5 MG tablet, PLEASE SEE ATTACHED FOR DETAILED DIRECTIONS, Disp: , Rfl:    metoprolol succinate (TOPROL-XL) 50 MG 24 hr tablet, Take 1 tablet (50 mg total) by mouth daily. Take with or immediately following a meal., Disp: 90 tablet, Rfl: 3   montelukast (SINGULAIR) 10 MG tablet, Take 10 mg by mouth daily as needed (for allergies.). , Disp: , Rfl:    nystatin (MYCOSTATIN) 100000 UNIT/ML suspension, Take 5 mLs by mouth as needed., Disp: , Rfl:    pantoprazole (PROTONIX) 40 MG tablet, Take 40 mg by mouth 2 (two)  times daily., Disp: , Rfl:    potassium chloride SA (KLOR-CON M) 20 MEQ  tablet, Take 2 tablets (40 mEq total) by mouth every other day. With your Lasix., Disp: 90 tablet, Rfl: 3   predniSONE (DELTASONE) 10 MG tablet, Take 10 mg by mouth daily., Disp: , Rfl:    Simethicone 180 MG CAPS, Take 1-2 capsules by mouth as needed., Disp: , Rfl:    triamcinolone (NASACORT) 55 MCG/ACT AERO nasal inhaler, Place 2 sprays into the nose daily., Disp: , Rfl:    ZENPEP 25000-79000 units CPEP, Take by mouth., Disp: , Rfl:   Physical exam:  Vitals:   02/13/22 1410  BP: 123/80  Pulse: (!) 114  Resp: 20  Temp: (!) 96.2 F (35.7 C)  TempSrc: Tympanic  SpO2: 100%  Weight: 187 lb 12.8 oz (85.2 kg)  Height: 5\' 2"  (1.575 m)   Physical Exam Constitutional:      General: She is not in acute distress.    Comments: She ambulates independantly  HENT:     Head: Normocephalic and atraumatic.  Eyes:     General: No scleral icterus. Cardiovascular:     Rate and Rhythm: Normal rate.  Pulmonary:     Effort: Pulmonary effort is normal. No respiratory distress.     Breath sounds: No wheezing.     Comments: Decreased breath sounds bilaterally.  Abdominal:     General: There is no distension.     Palpations: Abdomen is soft.  Musculoskeletal:        General: No deformity. Normal range of motion.     Cervical back: Normal range of motion and neck supple.  Skin:    General: Skin is warm.  Neurological:     Mental Status: She is alert and oriented to person, place, and time. Mental status is at baseline.     Cranial Nerves: No cranial nerve deficit.     Coordination: Coordination normal.  Psychiatric:        Mood and Affect: Mood normal.        Latest Ref Rng & Units 12/07/2021    2:10 PM  CMP  Glucose 70 - 99 mg/dL 80   BUN 8 - 23 mg/dL 10   Creatinine 0.44 - 1.00 mg/dL 1.12   Sodium 135 - 145 mmol/L 139   Potassium 3.5 - 5.1 mmol/L 3.3   Chloride 98 - 111 mmol/L 105   CO2 22 - 32 mmol/L 28   Calcium 8.9 - 10.3 mg/dL 8.2   Total Protein 6.5 - 8.1 g/dL 5.8   Total  Bilirubin 0.3 - 1.2 mg/dL 0.7   Alkaline Phos 38 - 126 U/L 68   AST 15 - 41 U/L 30   ALT 0 - 44 U/L 20       Latest Ref Rng & Units 02/09/2022   11:26 AM  CBC  WBC 4.0 - 10.5 K/uL 5.5   Hemoglobin 12.0 - 15.0 g/dL 10.1   Hematocrit 36.0 - 46.0 % 33.5   Platelets 150 - 400 K/uL 127     RADIOGRAPHIC STUDIES: I have personally reviewed the radiological images as listed and agreed with the findings in the report. No results found.

## 2022-02-13 NOTE — Assessment & Plan Note (Addendum)
Patient is currently on leflunomide and Plaquenil. Continue follow up with rheumatology.

## 2022-02-13 NOTE — Assessment & Plan Note (Addendum)
Multifactorial, secondary to her chronic lung disease, anemia Worse of SOB today. Tachycardia.  Recommend CT chest angiogram. She would like to defer for now, she wants to  further discuss with her pulmonology and decide.  I recommend patient to go to ER if SOB persists.

## 2022-03-08 ENCOUNTER — Other Ambulatory Visit: Payer: Self-pay | Admitting: Physician Assistant

## 2022-03-08 DIAGNOSIS — Z1231 Encounter for screening mammogram for malignant neoplasm of breast: Secondary | ICD-10-CM

## 2022-03-13 ENCOUNTER — Other Ambulatory Visit: Payer: Medicare Other

## 2022-03-13 ENCOUNTER — Inpatient Hospital Stay: Payer: Medicare Other | Attending: Oncology

## 2022-03-13 DIAGNOSIS — D539 Nutritional anemia, unspecified: Secondary | ICD-10-CM | POA: Insufficient documentation

## 2022-03-13 DIAGNOSIS — D696 Thrombocytopenia, unspecified: Secondary | ICD-10-CM | POA: Insufficient documentation

## 2022-03-13 DIAGNOSIS — D631 Anemia in chronic kidney disease: Secondary | ICD-10-CM

## 2022-03-13 LAB — IRON AND TIBC
Iron: 77 ug/dL (ref 28–170)
Saturation Ratios: 31 % (ref 10.4–31.8)
TIBC: 248 ug/dL — ABNORMAL LOW (ref 250–450)
UIBC: 171 ug/dL

## 2022-03-13 LAB — CBC WITH DIFFERENTIAL/PLATELET
Abs Immature Granulocytes: 0 10*3/uL (ref 0.00–0.07)
Basophils Absolute: 0 10*3/uL (ref 0.0–0.1)
Basophils Relative: 1 %
Eosinophils Absolute: 0.3 10*3/uL (ref 0.0–0.5)
Eosinophils Relative: 6 %
HCT: 31.5 % — ABNORMAL LOW (ref 36.0–46.0)
Hemoglobin: 9.8 g/dL — ABNORMAL LOW (ref 12.0–15.0)
Immature Granulocytes: 0 %
Lymphocytes Relative: 30 %
Lymphs Abs: 1.2 10*3/uL (ref 0.7–4.0)
MCH: 32.3 pg (ref 26.0–34.0)
MCHC: 31.1 g/dL (ref 30.0–36.0)
MCV: 104 fL — ABNORMAL HIGH (ref 80.0–100.0)
Monocytes Absolute: 0.6 10*3/uL (ref 0.1–1.0)
Monocytes Relative: 14 %
Neutro Abs: 2 10*3/uL (ref 1.7–7.7)
Neutrophils Relative %: 49 %
Platelets: 117 10*3/uL — ABNORMAL LOW (ref 150–400)
RBC: 3.03 MIL/uL — ABNORMAL LOW (ref 3.87–5.11)
RDW: 14.1 % (ref 11.5–15.5)
WBC: 4.1 10*3/uL (ref 4.0–10.5)
nRBC: 0 % (ref 0.0–0.2)

## 2022-03-13 LAB — FERRITIN: Ferritin: 114 ng/mL (ref 11–307)

## 2022-03-14 ENCOUNTER — Telehealth: Payer: Self-pay

## 2022-03-14 NOTE — Telephone Encounter (Signed)
-----   Message from Earlie Server, MD sent at 03/13/2022 11:14 PM EST ----- Repeat cbc showed blood level is still low. I recommend to obtain bone marrow biopsy further work up.  If she agrees, please arrange.

## 2022-03-14 NOTE — Telephone Encounter (Signed)
Unable to reach pt, detailed VM left. Also spoke to daughter, who states she will discuss with Courtney Wilcox and call back to let us know if she would like to proceed with BMBX

## 2022-03-20 ENCOUNTER — Other Ambulatory Visit: Payer: Self-pay

## 2022-03-20 DIAGNOSIS — D539 Nutritional anemia, unspecified: Secondary | ICD-10-CM

## 2022-03-20 DIAGNOSIS — D631 Anemia in chronic kidney disease: Secondary | ICD-10-CM

## 2022-03-20 NOTE — Telephone Encounter (Signed)
Patient agreeable to biopsy. Request has been faxed to [facility]

## 2022-03-21 ENCOUNTER — Encounter: Payer: Self-pay | Admitting: Oncology

## 2022-03-21 NOTE — Telephone Encounter (Addendum)
Pt scheduled for biopsy on 3/15 '@9'$ :30a arrive 8:30a. Pt aware of apt

## 2022-03-28 ENCOUNTER — Telehealth: Payer: Self-pay | Admitting: *Deleted

## 2022-03-28 NOTE — Telephone Encounter (Signed)
Called and spoke to pt and they will r/s to the week of 4/15. BmBx for 3/15 will be cancelled.   New Bx date pending.

## 2022-03-28 NOTE — Telephone Encounter (Signed)
Patient called and states that she is having problems with her allergies and she wants to put off her BMBx until April 10 th or so  Just before she has to see Dr Tasia Catchings on the 29th of April. Or she is asking if there is a reason that it needs to be done sooner then she would keep her appointment for this Friday. Also she is asking about whether or not she needs to stop her Eliquis before the biopsy. Please return her call to discuss

## 2022-03-29 ENCOUNTER — Encounter: Payer: Self-pay | Admitting: Oncology

## 2022-03-31 ENCOUNTER — Ambulatory Visit: Payer: Medicare Other

## 2022-04-04 NOTE — Telephone Encounter (Signed)
Patient informed of biopsy date and location.

## 2022-04-28 ENCOUNTER — Ambulatory Visit (INDEPENDENT_AMBULATORY_CARE_PROVIDER_SITE_OTHER): Payer: Medicare Other

## 2022-04-28 ENCOUNTER — Ambulatory Visit: Payer: Medicare Other | Attending: Internal Medicine | Admitting: Internal Medicine

## 2022-04-28 ENCOUNTER — Encounter: Payer: Self-pay | Admitting: Internal Medicine

## 2022-04-28 VITALS — BP 132/80 | HR 61 | Ht 64.0 in | Wt 189.2 lb

## 2022-04-28 DIAGNOSIS — I471 Supraventricular tachycardia, unspecified: Secondary | ICD-10-CM

## 2022-04-28 DIAGNOSIS — R0602 Shortness of breath: Secondary | ICD-10-CM | POA: Diagnosis not present

## 2022-04-28 DIAGNOSIS — I2699 Other pulmonary embolism without acute cor pulmonale: Secondary | ICD-10-CM

## 2022-04-28 MED ORDER — APIXABAN 5 MG PO TABS
5.0000 mg | ORAL_TABLET | Freq: Two times a day (BID) | ORAL | 6 refills | Status: DC
Start: 2022-04-28 — End: 2022-10-03

## 2022-04-28 NOTE — Progress Notes (Unsigned)
Follow-up Outpatient Visit Date: 04/28/2022  Primary Care Provider: Patrice Paradise, MD [address] [facility] [place] [address]  Chief Complaint: Follow-up chest pain and SVT  HPI:  Ms. Heidler is a 77 y.o. female with history of supraventricular tachycardia, pulmonary embolism (11/2020), rheumatoid arthritis, and obstructive sleep apnea, who presents for follow-up of shortness of breath and SVT.  I met her in January after she transitioned her care from Dr. Beatrix Fetters at [facility].  At that time, she complained of progressive shortness of breath for at least a year, now present with minimal exertion.  She reported stable 2-3 pillow orthopnea.  Despite multiple asthma treatments, her breathing had not improved.  She had recently been started on dupilumab by Dr. Karna Christmas of as well as iron infusions by Dr. Cathie Hoops.  Preceding echocardiogram had shown normal LVEF with grade 2 diastolic dysfunction and mild mitral regurgitation.  We agreed to decrease metoprolol succinate to 50 mg daily to see if this would help some of her symptoms.  Due to leg pain and decreased pedal pulses, ABIs were also ordered (though never performed).  Today, Ms. Fana reports that she is feeling a little better with less chest pressure.  It seems to respond to pantoprazole use and is most pronounced when she is sitting in her recliner in the evening.  She has continued to get short of breath easily, often accompanied by wheezing.  She has continued iron infusions and Dupixan but is not sure that the therapies are helping her breathing much.  She did not notice any improvement in her fatigue/dyspnea with de-escalation of metoprolol at our last visit.  Denies lightheadedness or significant edema.  She is scheduled for a bone marrow biopsy next week at the direction of Dr. Cathie Hoops to better understand her chronic anemia and insufficient response to IV  iron.  --------------------------------------------------------------------------------------------------  Cardiovascular History & Procedures: Cardiovascular Problems: Supraventricular tachycardia Shortness of breath   Risk Factors: Age greater than 41, family history, and obesity   Cath/PCI: LHC (04/16/2003): Normal coronary arteries LVEF 60-65%.  LVEDP 17 mmHg.   CV Surgery: None   EP Procedures and Devices: None   Non-Invasive Evaluation(s): TTE (10/31/2021): Normal LV size with mild LVH.  LVEF 60-65% with grade 2 diastolic dysfunction.  Normal RV size and function.  Severe left atrial enlargement.  Mild mitral regurgitation.  Trivial tricuspid regurgitation.  Normal aortic valve.  Normal CVP.  Recent CV Pertinent Labs: Lab Results  Component Value Date   INR 1.0 11/19/2020   BNP 181.1 (H) 08/24/2021   K 3.3 (L) 12/07/2021   MG 1.9 07/13/2021   BUN 10 12/07/2021   CREATININE 1.12 (H) 12/07/2021    Past medical and surgical history were reviewed and updated in EPIC.  Current Meds  Medication Sig   acetaminophen (TYLENOL) 500 MG tablet Take 1,000 mg by mouth 3 (three) times daily as needed for mild pain.    ALPRAZolam (XANAX) 0.5 MG tablet Take by mouth daily as needed.   apixaban (ELIQUIS) 5 MG TABS tablet Take 1 tablet (5 mg total) by mouth 2 (two) times daily.   Cholecalciferol (VITAMIN D) 2000 units tablet Take 4,000 Units by mouth daily.    citalopram (CELEXA) 20 MG tablet Take 20 mg by mouth every evening.    Cyanocobalamin 1000 MCG/ML KIT Inject as directed every 30 (thirty) days.   Dupilumab 300 MG/2ML SOPN Inject 4 mLs into the skin every 14 (fourteen) days.   EPINEPHrine 0.3  mg/0.3 mL IJ SOAJ injection Inject 0.3 mg into the muscle as needed.   estradiol (ESTRACE) 0.1 MG/GM vaginal cream Insert pea size amount vaginally nightly x 2 weeks, then every other night x 2 weeks, then 2-3 times weekly for maintenance   famotidine (PEPCID) 20 MG tablet Take by mouth  daily as needed.   hydroxychloroquine (PLAQUENIL) 200 MG tablet Take 1 tablet (200 mg total) by mouth daily.   leflunomide (ARAVA) 20 MG tablet Take 10 mg by mouth daily.   levalbuterol (XOPENEX) 0.63 MG/3ML nebulizer solution as needed for wheezing.   meclizine (ANTIVERT) 12.5 MG tablet PLEASE SEE ATTACHED FOR DETAILED DIRECTIONS   metoprolol succinate (TOPROL-XL) 50 MG 24 hr tablet Take 1 tablet (50 mg total) by mouth daily. Take with or immediately following a meal.   montelukast (SINGULAIR) 10 MG tablet Take 10 mg by mouth daily as needed (for allergies.).    nystatin (MYCOSTATIN) 100000 UNIT/ML suspension Take 5 mLs by mouth as needed.   pantoprazole (PROTONIX) 40 MG tablet Take 40 mg by mouth 2 (two) times daily.   potassium chloride SA (KLOR-CON M) 20 MEQ tablet Take 2 tablets (40 mEq total) by mouth every other day. With your Lasix.   predniSONE (DELTASONE) 10 MG tablet Take 10 mg by mouth daily.   Simethicone 180 MG CAPS Take 1-2 capsules by mouth as needed.   triamcinolone (NASACORT) 55 MCG/ACT AERO nasal inhaler Place 2 sprays into the nose daily.   ZENPEP 25000-79000 units CPEP Take by mouth.    Allergies: Lactose, Other, Biaxin [clarithromycin], Hydrocodone-acetaminophen, Oxycodone, Sulfa antibiotics, and Sulfasalazine  Social History   Tobacco Use   Smoking status: Never   Smokeless tobacco: Never  Vaping Use   Vaping Use: Never used  Substance Use Topics   Alcohol use: No   Drug use: No    Family History  Problem Relation Age of Onset   Asthma Mother    Breast cancer Mother 75   Emphysema Father        smoked   Lung cancer Father        smoked   Coronary artery disease Father 40    Review of Systems: A 12-system review of systems was performed and was negative except as noted in the HPI.  --------------------------------------------------------------------------------------------------  Physical Exam: BP 132/80 (BP Location: Left Arm, Patient Position:  Sitting, Cuff Size: Normal)   Pulse 61   Ht 5\' 4"  (1.626 m)   Wt 189 lb 4 oz (85.8 kg)   SpO2 96%   BMI 32.48 kg/m   General:  NAD. Neck: No JVD or HJR. Lungs: Mildly diminished breath sounds throughout without wheezes or crackles. Heart: Regular rate and rhythm without murmurs, rubs, or gallops. Abdomen: Soft, nontender, nondistended. Extremities: No lower extremity edema.  EKG: Brief atrial run with conversion to sinus bradycardia.  Lab Results  Component Value Date   WBC 4.1 03/13/2022   HGB 9.8 (L) 03/13/2022   HCT 31.5 (L) 03/13/2022   MCV 104.0 (H) 03/13/2022   PLT 117 (L) 03/13/2022    Lab Results  Component Value Date   NA 139 12/07/2021   K 3.3 (L) 12/07/2021   CL 105 12/07/2021   CO2 28 12/07/2021   BUN 10 12/07/2021   CREATININE 1.12 (H) 12/07/2021   GLUCOSE 80 12/07/2021   ALT 20 12/07/2021    --------------------------------------------------------------------------------------------------  ASSESSMENT AND PLAN: SVT: Episodes of tachycardia noted on EKG and rhythm strips today.  Question if there is some correlation between  her vague chest discomfort and worsening shortness of breath and the palpitations/tachycardia.  We discussed switching her from metoprolol to diltiazem to see if this would offer some improvement but have agreed to defer medication changes in favor of ambulatory cardiac monitoring (ZIO AT x 14 days).  If significant SVT burden is identified, Ms. Genevie Ann will need to follow-up soon with Dr. Lalla Brothers for further directions.  Shortness of breath: This is still incompletely understood but likely multifactorial including underlying lung disease, chronic anemia, and PSVT.  I worry that a component of pulmonary hypertension could also be playing a role given history of PE.  Recent coronary CTA did not show significant CAD.  Echo last fall was notable for grade 2 diastolic dysfunction and normal RV size and contraction.  PA pressure could not be  estimated.  If symptoms persist despite unrevealing event monitor/bone marrow biopsy, we will need to consider moving forward with right +/- left heart catheterization.  Follow-up: Return to clinic in 6 weeks.  Yvonne Kendall, MD 04/28/2022 3:56 PM

## 2022-04-28 NOTE — Patient Instructions (Signed)
Medication Instructions:  Your Physician recommend you continue on your current medication as directed.    *If you need a refill on your cardiac medications before your next appointment, please call your pharmacy*   Lab Work: None ordered today   Testing/Procedures: ZIO AT Long term monitor-Live Telemetry  Your physician has requested you wear a ZIO patch monitor for 14 days.  This is a single patch monitor. [employer] supplies one patch monitor per enrollment. Additional  stickers are not available.  Please do not apply patch if you will be having a Nuclear Stress Test, Echocardiogram, Cardiac CT, MRI,  or Chest Xray during the period you would be wearing the monitor. The patch cannot be worn during  these tests. You cannot remove and re-apply the ZIO AT patch monitor.  Your ZIO patch monitor will be mailed 3 day USPS to your address on file. It may take 3-5 days to  receive your monitor after you have been enrolled.  Once you have received your monitor, please review the enclosed instructions. Your monitor has  already been registered assigning a specific monitor serial # to you.   Billing and Patient Assistance Program information  Meredeth Ide has been supplied with any insurance information on record for billing. [employer] offers a sliding scale Patient Assistance Program for patients without insurance, or whose  insurance does not completely cover the cost of the ZIO patch monitor. You must apply for the  Patient Assistance Program to qualify for the discounted rate. To apply, call [employer] at [phone],  select option 4, select option 2 , ask to apply for the Patient Assistance Program, (you can request an  interpreter if needed). [employer] will ask your household income and how many people are in your  household. [employer] will quote your out-of-pocket cost based on this information. They will also be able  to set up a 12 month interest free payment plan if needed.  Applying the  monitor   Shave hair from upper left chest.  Hold the abrader disc by orange tab. Rub the abrader in 40 strokes over left upper chest as indicated in  your monitor instructions.  Clean area with 4 enclosed alcohol pads. Use all pads to ensure the area is cleaned thoroughly. Let  dry.  Apply patch as indicated in monitor instructions. Patch will be placed under collarbone on left side of  chest with arrow pointing upward.  Rub patch adhesive wings for 2 minutes. Remove the white label marked "1". Remove the white label  marked "2". Rub patch adhesive wings for 2 additional minutes.  While looking in a mirror, press and release button in center of patch. A small green light will flash 3-4  times. This will be your only indicator that the monitor has been turned on.  Do not shower for the first 24 hours. You may shower after the first 24 hours.  Press the button if you feel a symptom. You will hear a small click. Record Date, Time and Symptom in  the Patient Log.   Starting the Gateway  In your kit there is a Audiological scientist box the size of a cellphone. This is Buyer, retail. It transmits all your  recorded data to [facility]. This box must always stay within 10 feet of you. Open the box and push the *  button. There will be a light that blinks orange and then green a few times. When the light stops  blinking, the Gateway is connected to the ZIO patch. Call  [employer] at [phone] to confirm your monitor is transmitting.  Returning your monitor  Remove your patch and place it inside the Gateway. In the lower half of the Gateway there is a white  bag with prepaid postage on it. Place Gateway in bag and seal. Mail package back to [place] as soon as  possible. Your physician should have your final report approximately 7 days after you have mailed back  your monitor. Call [facility] Customer Care at [phone] if you have questions regarding your ZIO AT  patch monitor. Call them  immediately if you see an orange light blinking on your monitor.  If your monitor falls off in less than 4 days, contact our Monitor department at [phone]. If your  monitor becomes loose or falls off after 4 days call [employer] at [phone] for suggestions on  securing your monitor    Follow-Up: At [facility], you and your health needs are our priority.  As part of our continuing mission to provide you with exceptional heart care, we have created designated Provider Care Teams.  These Care Teams include your primary Cardiologist (physician) and Advanced Practice Providers (APPs -  Physician Assistants and Nurse Practitioners) who all work together to provide you with the care you need, when you need it.  We recommend signing up for the patient portal called "MyChart".  Sign up information is provided on this After Visit Summary.  MyChart is used to connect with patients for Virtual Visits (Telemedicine).  Patients are able to view lab/test results, encounter notes, upcoming appointments, etc.  Non-urgent messages can be sent to your provider as well.   To learn more about what you can do with MyChart, go to [email or web address].    Your next appointment:   6 week(s)  Provider:   You may see Yvonne Kendall, MD or one of the following Advanced Practice Providers on your designated Care Team:   Nicolasa Ducking, NP Eula Listen, PA-C Cadence Fransico Michael, PA-C Charlsie Quest, NP

## 2022-04-30 ENCOUNTER — Encounter: Payer: Self-pay | Admitting: Internal Medicine

## 2022-05-02 NOTE — H&P (Addendum)
Chief Complaint: Patient was seen in consultation today for anemia at the request of Yu,Zhou  Referring Physician(s): Yu,Zhou  Supervising Physician: Pernell Dupre  Patient Status: [facility] - Out-pt  History of Present Illness: Courtney Wilcox is a 77 y.o. female with PMHx of SVT, PE, RA, OSA and anemia and thrombocytopenia who follows with hematology, anemia was felt to be secondary to her RA medication, however underlying bone marrow disorder has not been fully evaluated. Patient is scheduled today for bone marrow biopsy.   Past Medical History:  Diagnosis Date   Allergic rhinitis    Anemia    Anxiety    Arthritis    Asthma    Atrophic vaginitis    B12 deficiency    Bronchitis, chronic    Cervical disc disease    Collagen vascular disease    RA   Depression    Fibrocystic breast disease    Foot ulcer    GERD (gastroesophageal reflux disease)    Hammer toe    Hyperlipidemia    IBS (irritable bowel syndrome)    Obesity    Obstructive sleep apnea    OSA (obstructive sleep apnea)    Osteoarthrosis    Multi site   Osteopenia    Rheumatoid arthritis    Shingles    Thyroid nodule    MULTIPLE   Tonsillitis    recurrent   Umbilical hernia     Past Surgical History:  Procedure Laterality Date   BACK SURGERY  2011   LAMINECTOMY   BREAST BIOPSY Right    neg   CERVICAL FUSION  2011, 2012   X 2   COLONOSCOPY  2005, 2015   COLONOSCOPY WITH PROPOFOL N/A 08/04/2020   Procedure: COLONOSCOPY WITH PROPOFOL;  Surgeon: Earline Mayotte, MD;  Location: [facility] ENDOSCOPY;  Service: Endoscopy;  Laterality: N/A;   ESOPHAGOGASTRODUODENOSCOPY (EGD) WITH PROPOFOL N/A 09/26/2021   Procedure: ESOPHAGOGASTRODUODENOSCOPY (EGD) WITH PROPOFOL;  Surgeon: Jaynie Collins, DO;  Location: [facility] ENDOSCOPY;  Service: Gastroenterology;  Laterality: N/A;   ESOPHAGOGASTRODUODENOSCOPY ENDOSCOPY  2005, 2013, 2015   EYE SURGERY Bilateral    Cataract Extraction with IOL    HERNIA REPAIR      JOINT REPLACEMENT     KNEE ARTHROPLASTY Left 07/12/2016   Procedure: COMPUTER ASSISTED TOTAL KNEE ARTHROPLASTY;  Surgeon: Donato Heinz, MD;  Location: [facility] ORS;  Service: Orthopedics;  Laterality: Left;   Laryngeal tear after intubation w/repair     LUMBAR DISC SURGERY  12/2009   REPLACEMENT TOTAL KNEE Right 06/2007   DR. HOOTEN, [facility]   REPLACEMENT UNICONDYLAR JOINT KNEE     DR. HOOTEN, [facility]   SHOULDER ARTHROSCOPY WITH ROTATOR CUFF REPAIR Right    TONSILLECTOMY     TOTAL HIP ARTHROPLASTY Right 01/31/2017   Procedure: TOTAL HIP ARTHROPLASTY;  Surgeon: Donato Heinz, MD;  Location: [facility] ORS;  Service: Orthopedics;  Laterality: Right;   TUBAL LIGATION     UPPER GI ENDOSCOPY      Allergies: Lactose, Other, Biaxin [clarithromycin], Hydrocodone-acetaminophen, Oxycodone, Sulfa antibiotics, and Sulfasalazine  Medications: Prior to Admission medications   Medication Sig Start Date End Date Taking? Authorizing Provider  acetaminophen (TYLENOL) 500 MG tablet Take 1,000 mg by mouth 3 (three) times daily as needed for mild pain.     [provider]  ALPRAZolam Prudy Feeler) 0.5 MG tablet Take by mouth daily as needed. 07/07/20   [provider]  apixaban (ELIQUIS) 5 MG TABS tablet Take 1 tablet (5 mg total) by  mouth 2 (two) times daily. 04/28/22   End, Cristal Deer, MD  Cholecalciferol (VITAMIN D) 2000 units tablet Take 4,000 Units by mouth daily.     [provider]  citalopram (CELEXA) 20 MG tablet Take 20 mg by mouth every evening.  06/15/16   [provider]  Cyanocobalamin 1000 MCG/ML KIT Inject as directed every 30 (thirty) days.    [provider]  Dupilumab 300 MG/2ML SOPN Inject 4 mLs into the skin every 14 (fourteen) days. 12/15/21   [provider]  EPINEPHrine 0.3 mg/0.3 mL IJ SOAJ injection Inject 0.3 mg into the muscle as needed. 11/29/21   [provider]  estradiol (ESTRACE) 0.1 MG/GM vaginal cream Insert pea size amount  vaginally nightly x 2 weeks, then every other night x 2 weeks, then 2-3 times weekly for maintenance 06/18/18   [provider]  famotidine (PEPCID) 20 MG tablet Take by mouth daily as needed.    [provider]  hydroxychloroquine (PLAQUENIL) 200 MG tablet Take 1 tablet (200 mg total) by mouth daily. 07/21/16   Tera Partridge, PA  leflunomide (ARAVA) 20 MG tablet Take 10 mg by mouth daily. 06/30/20   [provider]  levalbuterol Pauline Aus) 0.63 MG/3ML nebulizer solution as needed for wheezing. 04/25/21   [provider]  meclizine (ANTIVERT) 12.5 MG tablet PLEASE SEE ATTACHED FOR DETAILED DIRECTIONS 10/05/18   [provider]  metoprolol succinate (TOPROL-XL) 50 MG 24 hr tablet Take 1 tablet (50 mg total) by mouth daily. Take with or immediately following a meal. 01/19/22   End, Cristal Deer, MD  montelukast (SINGULAIR) 10 MG tablet Take 10 mg by mouth daily as needed (for allergies.).     [provider]  nystatin (MYCOSTATIN) 100000 UNIT/ML suspension Take 5 mLs by mouth as needed. 06/21/21   [provider]  pantoprazole (PROTONIX) 40 MG tablet Take 40 mg by mouth 2 (two) times daily. 04/05/21   [provider]  potassium chloride SA (KLOR-CON M) 20 MEQ tablet Take 2 tablets (40 mEq total) by mouth every other day. With your Lasix. 07/15/21   Lanier Prude, MD  predniSONE (DELTASONE) 10 MG tablet Take 10 mg by mouth daily. 11/18/20   [provider]  Simethicone 180 MG CAPS Take 1-2 capsules by mouth as needed.    [provider]  tiZANidine (ZANAFLEX) 2 MG tablet Take 1 mg by mouth at bedtime. Patient not taking: Reported on 04/28/2022 04/12/22 07/11/22  [provider]  triamcinolone (NASACORT) 55 MCG/ACT AERO nasal inhaler Place 2 sprays into the nose daily.    [provider]  ZENPEP 25000-79000 units CPEP Take by mouth. 01/20/22   [provider]     Family History  Problem Relation Age of  Onset   Asthma Mother    Breast cancer Mother 6   Emphysema Father        smoked   Lung cancer Father        smoked   Coronary artery disease Father 44    Social History   Socioeconomic History   Marital status: Married    Spouse name: Not on file   Number of children: Not on file   Years of education: Not on file   Highest education level: Not on file  Occupational History   Not on file  Tobacco Use   Smoking status: Never   Smokeless tobacco: Never  Vaping Use   Vaping Use: Never used  Substance and Sexual Activity  Alcohol use: No   Drug use: No   Sexual activity: Not on file  Other Topics Concern   Not on file  Social History Narrative   Not on file   Social Determinants of Health   Financial Resource Strain: Not on file  Food Insecurity: Not on file  Transportation Needs: Not on file  Physical Activity: Not on file  Stress: Not on file  Social Connections: Not on file    Review of Systems: A 12 point ROS discussed and pertinent positives are indicated in the HPI above.  All other systems are negative.  Review of Systems  Vital Signs: There were no vitals taken for this visit.  Physical Exam  Imaging: No results found.  Labs:  CBC: Recent Labs    12/07/21 1410 02/09/22 1126 03/13/22 1354  WBC 5.8 5.5 4.1  HGB 9.9* 10.1* 9.8*  HCT 31.8* 33.5* 31.5*  PLT 140* 127* 117*    COAGS: No results for input(s): "INR", "APTT" in the last 8760 hours.  BMP: Recent Labs    07/13/21 1642 12/07/21 1410  NA 146* 139  K 3.0* 3.3*  CL 106 105  CO2 31 28  GLUCOSE 88 80  BUN 18 10  CALCIUM 8.9 8.2*  CREATININE 1.55* 1.12*  GFRNONAA 35* 52*    LIVER FUNCTION TESTS: Recent Labs    12/07/21 1410  BILITOT 0.7  AST 30  ALT 20  ALKPHOS 68  PROT 5.8*  ALBUMIN 3.1*    Assessment and Plan: This is a 76 year old female with PMHx of SVT, PE, RA, OSA and anemia and thrombocytopenia who follows with hematology, anemia was felt to be secondary  to her RA medication, however underlying bone marrow disorder has not been fully evaluated. Patient is scheduled today for bone marrow biopsy.   The patient has been NPO, labs and vitals have been reviewed.   Risks and benefits of image guided bone marrow biopsy with moderate sedation was discussed with the patient and/or patient's family including, but not limited to bleeding, infection, damage to adjacent structures or low yield requiring additional tests.  All of the questions were answered and there is agreement to proceed.  Consent signed and in chart.   Thank you for this interesting consult.  I greatly enjoyed meeting Courtney Wilcox and look forward to participating in their care.  A copy of this report was sent to the requesting provider on this date.  Electronically Signed: Berneta Levins, PA-C 05/02/2022, 1:16 PM   I spent a total of 15 Minutes in face to face in clinical consultation, greater than 50% of which was counseling/coordinating care for anemia.

## 2022-05-03 ENCOUNTER — Other Ambulatory Visit (HOSPITAL_COMMUNITY): Payer: Self-pay | Admitting: Student

## 2022-05-03 DIAGNOSIS — D696 Thrombocytopenia, unspecified: Secondary | ICD-10-CM

## 2022-05-03 NOTE — Progress Notes (Signed)
Patient for CT Bone Marrow Biopsy on Thurs 05/04/2022, I called and spoke with the patient on the phone and gave pre-procedure instructions. Pt was made aware to be here at 8:30a, NPO after MN prior to procedure as well as driver post procedure/recovery/discharge. Pt stated understanding.  Called 05/03/2022

## 2022-05-04 ENCOUNTER — Ambulatory Visit
Admission: RE | Admit: 2022-05-04 | Discharge: 2022-05-04 | Disposition: A | Discharge status: Home / Self Care | Payer: Medicare Other | Source: Ambulatory Visit | Attending: Oncology | Admitting: Oncology

## 2022-05-04 ENCOUNTER — Other Ambulatory Visit: Payer: Self-pay

## 2022-05-04 DIAGNOSIS — D539 Nutritional anemia, unspecified: Secondary | ICD-10-CM | POA: Diagnosis not present

## 2022-05-04 DIAGNOSIS — I471 Supraventricular tachycardia, unspecified: Secondary | ICD-10-CM | POA: Diagnosis not present

## 2022-05-04 DIAGNOSIS — D696 Thrombocytopenia, unspecified: Secondary | ICD-10-CM | POA: Insufficient documentation

## 2022-05-04 DIAGNOSIS — M069 Rheumatoid arthritis, unspecified: Secondary | ICD-10-CM | POA: Insufficient documentation

## 2022-05-04 DIAGNOSIS — Z1379 Encounter for other screening for genetic and chromosomal anomalies: Secondary | ICD-10-CM | POA: Diagnosis not present

## 2022-05-04 DIAGNOSIS — G4733 Obstructive sleep apnea (adult) (pediatric): Secondary | ICD-10-CM | POA: Diagnosis not present

## 2022-05-04 LAB — CBC WITH DIFFERENTIAL/PLATELET
Abs Immature Granulocytes: 0.01 10*3/uL (ref 0.00–0.07)
Basophils Absolute: 0.1 10*3/uL (ref 0.0–0.1)
Basophils Relative: 1 %
Eosinophils Absolute: 0.2 10*3/uL (ref 0.0–0.5)
Eosinophils Relative: 3 %
HCT: 33.3 % — ABNORMAL LOW (ref 36.0–46.0)
Hemoglobin: 10.6 g/dL — ABNORMAL LOW (ref 12.0–15.0)
Immature Granulocytes: 0 %
Lymphocytes Relative: 31 %
Lymphs Abs: 1.5 10*3/uL (ref 0.7–4.0)
MCH: 32.1 pg (ref 26.0–34.0)
MCHC: 31.8 g/dL (ref 30.0–36.0)
MCV: 100.9 fL — ABNORMAL HIGH (ref 80.0–100.0)
Monocytes Absolute: 0.4 10*3/uL (ref 0.1–1.0)
Monocytes Relative: 9 %
Neutro Abs: 2.7 10*3/uL (ref 1.7–7.7)
Neutrophils Relative %: 56 %
Platelets: 139 10*3/uL — ABNORMAL LOW (ref 150–400)
RBC: 3.3 MIL/uL — ABNORMAL LOW (ref 3.87–5.11)
RDW: 13.1 % (ref 11.5–15.5)
WBC: 4.8 10*3/uL (ref 4.0–10.5)
nRBC: 0 % (ref 0.0–0.2)

## 2022-05-04 MED ORDER — MIDAZOLAM HCL 2 MG/2ML IJ SOLN
INTRAMUSCULAR | Status: AC
  Filled 2022-05-04: qty 4

## 2022-05-04 MED ORDER — FENTANYL CITRATE (PF) 100 MCG/2ML IJ SOLN
INTRAMUSCULAR | Status: AC
  Filled 2022-05-04: qty 2

## 2022-05-04 MED ORDER — MIDAZOLAM HCL 2 MG/2ML IJ SOLN
INTRAMUSCULAR | Status: AC | PRN
  Administered 2022-05-04 (×2): 1 mg via INTRAVENOUS

## 2022-05-04 MED ORDER — LIDOCAINE HCL (PF) 1 % IJ SOLN
10.0000 mL | Freq: Once | INTRAMUSCULAR | Status: AC
  Administered 2022-05-04: 10 mL

## 2022-05-04 MED ORDER — FENTANYL CITRATE (PF) 100 MCG/2ML IJ SOLN
INTRAMUSCULAR | Status: AC | PRN
  Administered 2022-05-04 (×2): 50 ug via INTRAVENOUS

## 2022-05-04 MED ORDER — SODIUM CHLORIDE 0.9 % IV SOLN: INTRAVENOUS | Status: DC

## 2022-05-04 MED ORDER — HEPARIN SOD (PORK) LOCK FLUSH 100 UNIT/ML IV SOLN
INTRAVENOUS | Status: AC
  Filled 2022-05-04: qty 5

## 2022-05-04 NOTE — Discharge Instructions (Signed)
Bone Marrow Aspiration and Bone Marrow Biopsy, Adult, Care After This sheet gives you information about how to care for yourself after your procedure. If you have problems or questions, contact your health care provider.  What can I expect after the procedure?  After the procedure, it is common to have: Mild pain and tenderness. Swelling. Bruising.  Follow these instructions at home: Take over-the-counter or prescription medicines only as told by your health care provider. You may shower tomorrow Remove band aid tomorrow, replace with another bandaid if  site has any drainage from biopsy site. Wash your hands with soap and water before you touch your biopsy site  If soap and water are not available, use hand sanitizer. Change your dressing frequently for bleeding and/or drainage. Check your puncture site every day for signs of infection. Check for: More redness, swelling, or pain. More fluid or blood. Warmth. Pus or a bad smell. Return to your normal activities in 24hours.  Do not drive for 24 hours if you were given a medicine to help you relax (sedative). Keep all follow-up visits as told by your health care provider. This is important. Contact a health care provider if: You have more redness, swelling, or pain around the puncture site. You have more fluid or blood coming from the puncture site. Your puncture site feels warm to the touch. You have pus or a bad smell coming from the puncture site. You have a fever. Your pain is not controlled with medicine. This information is not intended to replace advice given to you by your health care provider. Make sure you discuss any questions you have with your health care provider. Document Released: 07/22/2004 Document Revised: 07/23/2015 Document Reviewed: 06/16/2015 Elsevier Interactive Patient Education  2018 [employer] 

## 2022-05-04 NOTE — Procedures (Signed)
Interventional Radiology Procedure Note  Date of Procedure: 05/04/2022  Procedure: CT BMBx  Findings:  1. CT BMBx   Complications: No immediate complications noted.   Estimated Blood Loss: minimal  Follow-up and Recommendations: 1. Bedrest 1 hour    Olive Bass, MD  Vascular & Interventional Radiology  05/04/2022 10:30 AM

## 2022-05-05 ENCOUNTER — Other Ambulatory Visit: Payer: Medicare Other

## 2022-05-05 ENCOUNTER — Inpatient Hospital Stay: Payer: Medicare Other

## 2022-05-09 DIAGNOSIS — I471 Supraventricular tachycardia, unspecified: Secondary | ICD-10-CM

## 2022-05-09 LAB — SURGICAL PATHOLOGY

## 2022-05-12 ENCOUNTER — Inpatient Hospital Stay: Payer: Medicare Other | Attending: Oncology

## 2022-05-12 ENCOUNTER — Other Ambulatory Visit: Payer: Medicare Other

## 2022-05-12 ENCOUNTER — Encounter (HOSPITAL_COMMUNITY): Payer: Self-pay | Admitting: Oncology

## 2022-05-12 DIAGNOSIS — Z803 Family history of malignant neoplasm of breast: Secondary | ICD-10-CM | POA: Insufficient documentation

## 2022-05-12 DIAGNOSIS — D638 Anemia in other chronic diseases classified elsewhere: Secondary | ICD-10-CM | POA: Insufficient documentation

## 2022-05-12 DIAGNOSIS — Z801 Family history of malignant neoplasm of trachea, bronchus and lung: Secondary | ICD-10-CM | POA: Diagnosis not present

## 2022-05-12 DIAGNOSIS — D631 Anemia in chronic kidney disease: Secondary | ICD-10-CM

## 2022-05-12 DIAGNOSIS — M069 Rheumatoid arthritis, unspecified: Secondary | ICD-10-CM | POA: Diagnosis present

## 2022-05-12 DIAGNOSIS — D696 Thrombocytopenia, unspecified: Secondary | ICD-10-CM | POA: Diagnosis not present

## 2022-05-12 DIAGNOSIS — Z79899 Other long term (current) drug therapy: Secondary | ICD-10-CM | POA: Diagnosis not present

## 2022-05-12 LAB — CBC WITH DIFFERENTIAL/PLATELET
Abs Immature Granulocytes: 0.02 10*3/uL (ref 0.00–0.07)
Basophils Absolute: 0.1 10*3/uL (ref 0.0–0.1)
Basophils Relative: 1 %
Eosinophils Absolute: 0.3 10*3/uL (ref 0.0–0.5)
Eosinophils Relative: 5 %
HCT: 32.2 % — ABNORMAL LOW (ref 36.0–46.0)
Hemoglobin: 10.4 g/dL — ABNORMAL LOW (ref 12.0–15.0)
Immature Granulocytes: 0 %
Lymphocytes Relative: 35 %
Lymphs Abs: 1.9 10*3/uL (ref 0.7–4.0)
MCH: 32.7 pg (ref 26.0–34.0)
MCHC: 32.3 g/dL (ref 30.0–36.0)
MCV: 101.3 fL — ABNORMAL HIGH (ref 80.0–100.0)
Monocytes Absolute: 0.5 10*3/uL (ref 0.1–1.0)
Monocytes Relative: 9 %
Neutro Abs: 2.8 10*3/uL (ref 1.7–7.7)
Neutrophils Relative %: 50 %
Platelets: 133 10*3/uL — ABNORMAL LOW (ref 150–400)
RBC: 3.18 MIL/uL — ABNORMAL LOW (ref 3.87–5.11)
RDW: 13.1 % (ref 11.5–15.5)
WBC: 5.6 10*3/uL (ref 4.0–10.5)
nRBC: 0 % (ref 0.0–0.2)

## 2022-05-12 LAB — FERRITIN: Ferritin: 116 ng/mL (ref 11–307)

## 2022-05-12 LAB — IRON AND TIBC
Iron: 95 ug/dL (ref 28–170)
Saturation Ratios: 38 % — ABNORMAL HIGH (ref 10.4–31.8)
TIBC: 251 ug/dL (ref 250–450)
UIBC: 156 ug/dL

## 2022-05-12 MED FILL — Iron Sucrose Inj 20 MG/ML (Fe Equiv): INTRAVENOUS | Qty: 10 | Status: AC

## 2022-05-15 ENCOUNTER — Encounter: Payer: Self-pay | Admitting: Oncology

## 2022-05-15 ENCOUNTER — Inpatient Hospital Stay: Payer: Medicare Other

## 2022-05-15 ENCOUNTER — Inpatient Hospital Stay (HOSPITAL_BASED_OUTPATIENT_CLINIC_OR_DEPARTMENT_OTHER): Payer: Medicare Other | Admitting: Oncology

## 2022-05-15 VITALS — BP 118/104 | HR 57 | Temp 97.6°F | Wt 188.7 lb

## 2022-05-15 DIAGNOSIS — D638 Anemia in other chronic diseases classified elsewhere: Secondary | ICD-10-CM

## 2022-05-15 DIAGNOSIS — D696 Thrombocytopenia, unspecified: Secondary | ICD-10-CM

## 2022-05-15 DIAGNOSIS — M069 Rheumatoid arthritis, unspecified: Secondary | ICD-10-CM

## 2022-05-15 DIAGNOSIS — D539 Nutritional anemia, unspecified: Secondary | ICD-10-CM

## 2022-05-15 MED ORDER — IRON-VITAMIN C 65-125 MG PO TABS: 1.0000 | ORAL_TABLET | Freq: Every day | ORAL | 2 refills | Status: DC

## 2022-05-15 NOTE — Assessment & Plan Note (Signed)
Chronic thrombocytopenia, mild.  Previous workup was negative. Maybe due to drug side effects  Counts are stable. Continue observation.

## 2022-05-15 NOTE — Assessment & Plan Note (Addendum)
Anemia due to chronic disease, inflammation, CKD.  Bone marrow biopsy showed Normocellular bone marrow (20-30%) with trilineage hematopoiesis and no increase in blasts no dysplasia, normal cytogenetics.  S/p IV venofer weekly x 4, iron panel has been stable. Hb is >10, no need for retacrit.  Recommend oral Vitron C once daily

## 2022-05-15 NOTE — Progress Notes (Signed)
Hematology/Oncology Progress note [phone] [phone]     REASON FOR VISIT Follow up for anemia and thrombocytopenia.    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  Bone marrow biopsy showed Normocellular bone marrow (20-30%) with trilineage hematopoiesis and no increase in blasts no dysplasia, normal cytogenetics.  S/p IV venofer weekly x 4, iron panel has been stable. Hb is >10, no need for retacrit.  Recommend oral Vitron C once daily     Rheumatoid arthritis (HCC) Patient is currently on leflunomide and Plaquenil. Continue follow up with rheumatology.  Thrombocytopenia (HCC) Chronic thrombocytopenia, mild.  Previous workup was negative. Maybe due to drug side effects  Counts are stable. Continue observation.     Orders Placed This Encounter  Procedures   CBC with Differential (Cancer Center Only)    Standing Status:   Future    Standing Expiration Date:   05/15/2023   Iron and TIBC    Standing Status:   Future    Standing Expiration Date:   05/15/2023   Ferritin    Standing Status:   Future    Standing Expiration Date:   05/15/2023   Retic Panel    Standing Status:   Future    Standing Expiration Date:   05/15/2023     Follow up in 4 months.  All questions were answered. The patient knows to call the clinic with any problems, questions or concerns.  Courtney Patience, MD, PhD [facility] Hematology Oncology 05/15/2022      INTERVAL HISTORY Courtney Wilcox is a 77 y.o. female who has above history reviewed by me today presents for follow up visit for management of pancytopenia. She has Rheumatoid arthritis, follows up with rheumatology She is on Leflutamide and on plaquenil. Leflutamide dose has been decreased to 10mg  in December 2023.  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 S/ p bone marrow biopsy and presents to  discuss results.   Review of Systems  Constitutional:  Positive for fatigue. Negative for appetite change, chills and fever.  HENT:   Negative for hearing loss and voice change.   Eyes:  Negative for eye problems.  Respiratory:  Positive for shortness of breath. Negative for chest tightness and cough.   Cardiovascular:  Negative for chest pain.  Gastrointestinal:  Negative for abdominal distention and blood in stool.  Endocrine: Negative for hot flashes.  Genitourinary:  Negative for difficulty urinating and frequency.   Musculoskeletal:  Negative for arthralgias.  Skin:  Negative for itching and rash.  Neurological:  Negative for extremity weakness.  Hematological:  Negative for adenopathy.  Psychiatric/Behavioral:  Negative for confusion.       Allergies  Allergen Reactions   Lactose Other (See Comments) and Diarrhea   Other Other (See Comments)    Pt ONLY tolerates SLOW-FE (slow released iron)---upsets IBS   Biaxin [Clarithromycin] Other (See Comments)    GI upset    Hydrocodone-Acetaminophen Itching    Tolerates acetaminophen    Oxycodone Itching   Sulfa Antibiotics Other (See Comments) and Diarrhea    GI upset  GI upset GI upset    Sulfasalazine Diarrhea     Past Medical History:  Diagnosis Date   Allergic rhinitis    Anemia    Anxiety    Arthritis    Asthma    Atrophic vaginitis    B12 deficiency    Bronchitis, chronic (HCC)  Cervical disc disease    Collagen vascular disease (HCC)    RA   Depression    Fibrocystic breast disease    Foot ulcer (HCC)    GERD (gastroesophageal reflux disease)    Hammer toe    Hyperlipidemia    IBS (irritable bowel syndrome)    Macular degeneration 2023   Obesity    Obstructive sleep apnea    OSA (obstructive sleep apnea)    Osteoarthrosis    Multi site   Osteopenia    Rheumatoid arthritis (HCC)    Shingles    Thyroid nodule    MULTIPLE   Tonsillitis    recurrent   Umbilical hernia      Past Surgical  History:  Procedure Laterality Date   BACK SURGERY  2011   LAMINECTOMY   BREAST BIOPSY Right    neg   CERVICAL FUSION  2011, 2012   X 2   COLONOSCOPY  2005, 2015   COLONOSCOPY WITH PROPOFOL N/A 08/04/2020   Procedure: COLONOSCOPY WITH PROPOFOL;  Surgeon: Earline Mayotte, MD;  Location: [facility] ENDOSCOPY;  Service: Endoscopy;  Laterality: N/A;   ESOPHAGOGASTRODUODENOSCOPY (EGD) WITH PROPOFOL N/A 09/26/2021   Procedure: ESOPHAGOGASTRODUODENOSCOPY (EGD) WITH PROPOFOL;  Surgeon: Jaynie Collins, DO;  Location: [facility] ENDOSCOPY;  Service: Gastroenterology;  Laterality: N/A;   ESOPHAGOGASTRODUODENOSCOPY ENDOSCOPY  2005, 2013, 2015   EYE SURGERY Bilateral    Cataract Extraction with IOL    HERNIA REPAIR     JOINT REPLACEMENT     KNEE ARTHROPLASTY Left 07/12/2016   Procedure: COMPUTER ASSISTED TOTAL KNEE ARTHROPLASTY;  Surgeon: Donato Heinz, MD;  Location: [facility] ORS;  Service: Orthopedics;  Laterality: Left;   Laryngeal tear after intubation w/repair     LUMBAR DISC SURGERY  12/2009   REPLACEMENT TOTAL KNEE Right 06/2007   DR. HOOTEN, [facility]   REPLACEMENT UNICONDYLAR JOINT KNEE     DR. HOOTEN, [facility]   SHOULDER ARTHROSCOPY WITH ROTATOR CUFF REPAIR Right    TONSILLECTOMY     TOTAL HIP ARTHROPLASTY Right 01/31/2017   Procedure: TOTAL HIP ARTHROPLASTY;  Surgeon: Donato Heinz, MD;  Location: [facility] ORS;  Service: Orthopedics;  Laterality: Right;   TUBAL LIGATION     UPPER GI ENDOSCOPY      Social History   Socioeconomic History   Marital status: Married    Spouse name: Courtney Wilcox   Number of children: 3   Years of education: Not on file   Highest education level: Not on file  Occupational History   Not on file  Tobacco Use   Smoking status: Never   Smokeless tobacco: Never  Vaping Use   Vaping Use: Never used  Substance and Sexual Activity   Alcohol use: No   Drug use: No   Sexual activity: Not on file  Other Topics Concern   Not on file  Social History Narrative   Not on file    Social Determinants of Health   Financial Resource Strain: Not on file  Food Insecurity: Not on file  Transportation Needs: Not on file  Physical Activity: Not on file  Stress: Not on file  Social Connections: Not on file  Intimate Partner Violence: Not on file    Family History  Problem Relation Age of Onset   Asthma Mother    Breast cancer Mother 72   Emphysema Father        smoked   Lung cancer Father        smoked   Coronary artery disease  Father 67     Current Outpatient Medications:    acetaminophen (TYLENOL) 500 MG tablet, Take 1,000 mg by mouth 3 (three) times daily as needed for mild pain. , Disp: , Rfl:    ALPRAZolam (XANAX) 0.5 MG tablet, Take by mouth daily as needed., Disp: , Rfl:    apixaban (ELIQUIS) 5 MG TABS tablet, Take 1 tablet (5 mg total) by mouth 2 (two) times daily., Disp: 30 tablet, Rfl: 6   Cholecalciferol (VITAMIN D) 2000 units tablet, Take 4,000 Units by mouth daily. , Disp: , Rfl:    citalopram (CELEXA) 20 MG tablet, Take 20 mg by mouth every evening. , Disp: , Rfl: 5   Cyanocobalamin 1000 MCG/ML KIT, Inject as directed every 30 (thirty) days., Disp: , Rfl:    Dupilumab 300 MG/2ML SOPN, Inject 4 mLs into the skin every 14 (fourteen) days., Disp: , Rfl:    EPINEPHrine 0.3 mg/0.3 mL IJ SOAJ injection, Inject 0.3 mg into the muscle as needed., Disp: , Rfl:    famotidine (PEPCID) 20 MG tablet, Take by mouth daily as needed., Disp: , Rfl:    hydroxychloroquine (PLAQUENIL) 200 MG tablet, Take 1 tablet (200 mg total) by mouth daily., Disp: 1 tablet, Rfl: 0   Iron-Vitamin C 65-125 MG TABS, Take 1 tablet by mouth daily., Disp: 30 tablet, Rfl: 2   leflunomide (ARAVA) 20 MG tablet, Take 10 mg by mouth daily., Disp: , Rfl:    levalbuterol (XOPENEX) 0.63 MG/3ML nebulizer solution, as needed for wheezing., Disp: , Rfl:    meclizine (ANTIVERT) 12.5 MG tablet, PLEASE SEE ATTACHED FOR DETAILED DIRECTIONS, Disp: , Rfl:    metoprolol succinate (TOPROL-XL) 50 MG 24  hr tablet, Take 1 tablet (50 mg total) by mouth daily. Take with or immediately following a meal., Disp: 90 tablet, Rfl: 3   montelukast (SINGULAIR) 10 MG tablet, Take 10 mg by mouth daily as needed (for allergies.). , Disp: , Rfl:    nystatin (MYCOSTATIN) 100000 UNIT/ML suspension, Take 5 mLs by mouth as needed., Disp: , Rfl:    pantoprazole (PROTONIX) 40 MG tablet, Take 40 mg by mouth 2 (two) times daily., Disp: , Rfl:    potassium chloride SA (KLOR-CON M) 20 MEQ tablet, Take 2 tablets (40 mEq total) by mouth every other day. With your Lasix., Disp: 90 tablet, Rfl: 3   predniSONE (DELTASONE) 10 MG tablet, Take 10 mg by mouth daily., Disp: , Rfl:    Simethicone 180 MG CAPS, Take 1-2 capsules by mouth as needed., Disp: , Rfl:    tiZANidine (ZANAFLEX) 2 MG tablet, Take 1 mg by mouth at bedtime., Disp: , Rfl:    triamcinolone (NASACORT) 55 MCG/ACT AERO nasal inhaler, Place 2 sprays into the nose daily., Disp: , Rfl:    ZENPEP 25000-79000 units CPEP, Take by mouth., Disp: , Rfl:    estradiol (ESTRACE) 0.1 MG/GM vaginal cream, Insert pea size amount vaginally nightly x 2 weeks, then every other night x 2 weeks, then 2-3 times weekly for maintenance (Patient not taking: Reported on 05/04/2022), Disp: , Rfl:   Physical exam:  Vitals:   05/15/22 1432  BP: (!) 118/104  Pulse: (!) 57  Temp: 97.6 F (36.4 C)  TempSrc: Tympanic  SpO2: 99%  Weight: 188 lb 11.2 oz (85.6 kg)   Physical Exam Constitutional:      General: She is not in acute distress.    Comments: She ambulates independantly  HENT:     Head: Normocephalic and atraumatic.  Eyes:  General: No scleral icterus. Cardiovascular:     Rate and Rhythm: Normal rate.  Pulmonary:     Effort: Pulmonary effort is normal. No respiratory distress.     Breath sounds: No wheezing.     Comments: Decreased breath sounds bilaterally.  Abdominal:     General: There is no distension.     Palpations: Abdomen is soft.  Musculoskeletal:         General: No deformity. Normal range of motion.     Cervical back: Normal range of motion and neck supple.  Skin:    General: Skin is warm.  Neurological:     Mental Status: She is alert and oriented to person, place, and time. Mental status is at baseline.     Cranial Nerves: No cranial nerve deficit.     Coordination: Coordination normal.  Psychiatric:        Mood and Affect: Mood normal.        Latest Ref Rng & Units 12/07/2021    2:10 PM  CMP  Glucose 70 - 99 mg/dL 80   BUN 8 - 23 mg/dL 10   Creatinine 0.98 - 1.00 mg/dL 1.19   Sodium 147 - 829 mmol/L 139   Potassium 3.5 - 5.1 mmol/L 3.3   Chloride 98 - 111 mmol/L 105   CO2 22 - 32 mmol/L 28   Calcium 8.9 - 10.3 mg/dL 8.2   Total Protein 6.5 - 8.1 g/dL 5.8   Total Bilirubin 0.3 - 1.2 mg/dL 0.7   Alkaline Phos 38 - 126 U/L 68   AST 15 - 41 U/L 30   ALT 0 - 44 U/L 20       Latest Ref Rng & Units 05/12/2022    3:36 PM  CBC  WBC 4.0 - 10.5 K/uL 5.6   Hemoglobin 12.0 - 15.0 g/dL 56.2   Hematocrit 13.0 - 46.0 % 32.2   Platelets 150 - 400 K/uL 133     RADIOGRAPHIC STUDIES: I have personally reviewed the radiological images as listed and agreed with the findings in the report. CT BONE MARROW BIOPSY & ASPIRATION  Result Date: 05/04/2022 INDICATION: macrocytic anemia EXAM: CT BONE MARROW BIOPSY AND ASPIRATION MEDICATIONS: None. ANESTHESIA/SEDATION: Moderate (conscious) sedation was employed during this procedure. A total of Versed 2 mg and Fentanyl 100 mcg was administered intravenously. Moderate Sedation Time: 10 minutes. The patient's level of consciousness and vital signs were monitored continuously by radiology nursing throughout the procedure under my direct supervision. FLUOROSCOPY TIME:  N/a COMPLICATIONS: None immediate. PROCEDURE: Informed written consent was obtained from the patient after a thorough discussion of the procedural risks, benefits and alternatives. All questions were addressed. Maximal Sterile Barrier  Technique was utilized including caps, mask, sterile gowns, sterile gloves, sterile drape, hand hygiene and skin antiseptic. A timeout was performed prior to the initiation of the procedure. The patient was placed prone on the CT exam table. Limited CT of the pelvis was performed for planning purposes. Skin entry site was marked, and the overlying skin was prepped and draped in the standard sterile fashion. Local analgesia was obtained with 1% lidocaine. Using CT guidance, an 11 gauge needle was advanced just deep to the cortex of the right posterior ilium. Subsequently, bone marrow aspiration and core biopsy were performed. Specimens were submitted to lab/pathology for handling. Hemostasis was achieved with manual pressure, and a clean dressing was placed. The patient tolerated the procedure well without immediate complication. IMPRESSION: Successful CT-guided bone marrow aspiration and core biopsy of  the right posterior ilium. Electronically Signed   By: Olive Bass M.D.   On: 05/04/2022 13:55

## 2022-05-15 NOTE — Assessment & Plan Note (Signed)
Patient is currently on leflunomide and Plaquenil. Continue follow up with rheumatology. 

## 2022-05-17 ENCOUNTER — Ambulatory Visit: Payer: Medicare Other | Admitting: Cardiology

## 2022-05-22 ENCOUNTER — Encounter (HOSPITAL_COMMUNITY): Payer: Self-pay | Admitting: Oncology

## 2022-05-22 ENCOUNTER — Telehealth: Payer: Self-pay | Admitting: Cardiology

## 2022-05-22 NOTE — Telephone Encounter (Signed)
Spoke with staff at [facility] and they wanted to make sure they did not need to send a second device. Assured them another device was not needed as she stated the current one is still transmitting. Awaiting patient to finish wearing and mail back as she is on 13/14 of days.

## 2022-05-22 NOTE — Telephone Encounter (Signed)
Calling to state that tomorrow is that last day for pt to wear monitor but calling to see if a replacement needs to be sent out. Office needs to hear back by 3 pm (Central time) or another monitor will be sent out automatically. Please advise

## 2022-06-12 NOTE — Progress Notes (Unsigned)
Cardiology Office Note Date:  06/14/2022  Patient ID:  Courtney Wilcox, Pruess Dec 09, 1945, MRN [id number] PCP:  Patrice Paradise, MD  Cardiologist:  Yvonne Kendall, MD Electrophysiologist: Lanier Prude, MD    Chief Complaint: 6 month follow-up SVT  History of Present Illness: Courtney Wilcox is a 77 y.o. female with PMH notable for pod persistent asthma, SVT, DVT/PE, SOB, fibromyalgia; seen today for Lanier Prude, MD for routine electrophysiology followup.  Last saw Dr. Lalla Brothers 10/2021 was having severe SOB with exertion and wheezing, he though more of a pulm process and referred to pulm. Has since been evaluated by [facility] with multiple medication adjustments with little to no improvement. She saw Dr. Okey Dupre 04/2022, with continued SOB symptoms. He ordered 2 week zio monitor and referral back to EP.  Today, she tells me that her SOB has not changed in the last few months. She continues to feel very winded with walking short distances, this has not changed or worsened. She is using wheelchair today in clinic. She is also very fatigued, but has trouble sleeping at night d/t fibromyalgia and pain.   She has some slight chest pressure and mild palpitations at times, but no frank chest pain. This also has not worsened in the past few months. No dizziness, syncope, presyncope.    Past Medical History:  Diagnosis Date   Allergic rhinitis    Anemia    Anxiety    Arthritis    Asthma    Atrophic vaginitis    B12 deficiency    Bronchitis, chronic (HCC)    Cervical disc disease    Collagen vascular disease (HCC)    RA   Depression    Fibrocystic breast disease    Foot ulcer (HCC)    GERD (gastroesophageal reflux disease)    Hammer toe    Hyperlipidemia    IBS (irritable bowel syndrome)    Macular degeneration 2023   Obesity    Obstructive sleep apnea    OSA (obstructive sleep apnea)    Osteoarthrosis    Multi site   Osteopenia    Rheumatoid arthritis (HCC)     Shingles    Thyroid nodule    MULTIPLE   Tonsillitis    recurrent   Umbilical hernia     Past Surgical History:  Procedure Laterality Date   BACK SURGERY  2011   LAMINECTOMY   BREAST BIOPSY Right    neg   CERVICAL FUSION  2011, 2012   X 2   COLONOSCOPY  2005, 2015   COLONOSCOPY WITH PROPOFOL N/A 08/04/2020   Procedure: COLONOSCOPY WITH PROPOFOL;  Surgeon: Earline Mayotte, MD;  Location: [facility] ENDOSCOPY;  Service: Endoscopy;  Laterality: N/A;   ESOPHAGOGASTRODUODENOSCOPY (EGD) WITH PROPOFOL N/A 09/26/2021   Procedure: ESOPHAGOGASTRODUODENOSCOPY (EGD) WITH PROPOFOL;  Surgeon: Jaynie Collins, DO;  Location: [facility] ENDOSCOPY;  Service: Gastroenterology;  Laterality: N/A;   ESOPHAGOGASTRODUODENOSCOPY ENDOSCOPY  2005, 2013, 2015   EYE SURGERY Bilateral    Cataract Extraction with IOL    HERNIA REPAIR     JOINT REPLACEMENT     KNEE ARTHROPLASTY Left 07/12/2016   Procedure: COMPUTER ASSISTED TOTAL KNEE ARTHROPLASTY;  Surgeon: Donato Heinz, MD;  Location: [facility] ORS;  Service: Orthopedics;  Laterality: Left;   Laryngeal tear after intubation w/repair     LUMBAR DISC SURGERY  12/2009   REPLACEMENT TOTAL KNEE Right 06/2007   DR. HOOTEN, [facility]   REPLACEMENT UNICONDYLAR JOINT KNEE     DR. HOOTEN,  [facility]   SHOULDER ARTHROSCOPY WITH ROTATOR CUFF REPAIR Right    TONSILLECTOMY     TOTAL HIP ARTHROPLASTY Right 01/31/2017   Procedure: TOTAL HIP ARTHROPLASTY;  Surgeon: Donato Heinz, MD;  Location: [facility] ORS;  Service: Orthopedics;  Laterality: Right;   TUBAL LIGATION     UPPER GI ENDOSCOPY      Current Outpatient Medications  Medication Instructions   acetaminophen (TYLENOL) 1,000 mg, Oral, 3 times daily PRN   ALPRAZolam (XANAX) 0.5 MG tablet Oral, Daily PRN   apixaban (ELIQUIS) 5 mg, Oral, 2 times daily   citalopram (CELEXA) 20 mg, Oral, Every evening   Cyanocobalamin 1000 MCG/ML KIT Injection, Every 30 days   diltiazem (CARDIZEM CD) 120 mg, Oral, Daily   Dupilumab 300 MG/2ML SOPN  4 mLs, Subcutaneous, Every 14 days   EPINEPHrine (EPI-PEN) 0.3 mg, Intramuscular, As needed   famotidine (PEPCID) 20 MG tablet Oral, Daily PRN   hydroxychloroquine (PLAQUENIL) 200 mg, Oral, Daily   Iron-Vitamin C 65-125 MG TABS 1 tablet, Oral, Daily   leflunomide (ARAVA) 10 mg, Oral, Daily   levalbuterol (XOPENEX) 0.63 MG/3ML nebulizer solution As needed   meclizine (ANTIVERT) 12.5 MG tablet PLEASE SEE ATTACHED FOR DETAILED DIRECTIONS   montelukast (SINGULAIR) 10 mg, Oral, Daily PRN   nystatin (MYCOSTATIN) 100000 UNIT/ML suspension 5 mLs, Oral, As needed   pantoprazole (PROTONIX) 40 mg, Oral, 2 times daily   potassium chloride SA (KLOR-CON M) 20 MEQ tablet 40 mEq, Oral, Every other day, With your Lasix.   predniSONE (DELTASONE) 10 mg, Oral, Daily   Simethicone 180 MG CAPS 1-2 capsules, Oral, As needed   tiZANidine (ZANAFLEX) 1 mg, Oral, Daily at bedtime   triamcinolone (NASACORT) 55 MCG/ACT AERO nasal inhaler 2 sprays, Nasal, Daily   Vitamin D 4,000 Units, Oral, Daily   ZENPEP 25000-79000 units CPEP Take by mouth.    Social History:  The patient  reports that she has never smoked. She has never used smokeless tobacco. She reports that she does not drink alcohol and does not use drugs.   Family History:   The patient's family history includes Asthma in her mother; Breast cancer (age of onset: 38) in her mother; Coronary artery disease (age of onset: 78) in her father; Emphysema in her father; Lung cancer in her father.  ROS:  Please see the history of present illness. All other systems are reviewed and otherwise negative.   PHYSICAL EXAM:  VS:  BP 125/63 (BP Location: Left Arm, Patient Position: Sitting, Cuff Size: Normal)   Pulse (!) 52   Ht 5\' 4"  (1.626 m)   Wt 190 lb (86.2 kg)   SpO2 95%   BMI 32.61 kg/m  BMI: Body mass index is 32.61 kg/m.  GEN- The patient is well appearing, alert and oriented x 3 today.   Lungs- Clear to ausculation bilaterally, normal work of breathing.   Heart- Regular rate and rhythm, no murmurs, rubs or gallops Extremities- No peripheral edema, warm, dry   EKG is ordered. Personal review of EKG from today shows:  SB, rate 52   Recent Labs: 07/13/2021: Magnesium 1.9 08/24/2021: B Natriuretic Peptide 181.1 12/07/2021: ALT 20; BUN 10; Creatinine, Ser 1.12; Potassium 3.3; Sodium 139 05/12/2022: Hemoglobin 10.4; Platelets 133  No results found for requested labs within last 365 days.   CrCl cannot be calculated (Patient's most recent lab result is older than the maximum 21 days allowed.).   Wt Readings from Last 3 Encounters:  06/14/22 190 lb (86.2 kg)  05/15/22  188 lb 11.2 oz (85.6 kg)  04/28/22 189 lb 4 oz (85.8 kg)     Additional studies reviewed include: Previous EP, cardiology notes.   Cardiac Tele monitoring, 06/06/2022   The patient was monitored for 14 days.   The predominant rhythm was sinus with an average rate of 57 bpm while in sinus rhythm (range 42-101 bpm).   There were rare PACs and PVCs.   25,709 episode of supraventricular tachycardia cardia occurred, lasting up to 40:12 minutes with a maximum rate of 222 bpm.   A single 5 beat run of nonsustained ventricular tachycardia was observed with a maximum rate of 174 bpm.   There was no prolonged pause.   Patient triggered events corresponded to sinus rhythm, SVT, and PACs.  TTE, 10/31/2021 1. Left ventricular ejection fraction, by estimation, is 60 to 65%. The left ventricle has normal function. The left ventricle has no regional wall motion abnormalities. There is mild left ventricular hypertrophy. Left ventricular diastolic parameters are consistent with Grade II diastolic dysfunction (pseudonormalization).   2. Right ventricular systolic function is normal. The right ventricular size is normal.   3. Left atrial size was severely dilated.   4. The mitral valve is normal in structure. Mild mitral valve regurgitation.   5. The aortic valve is tricuspid. Aortic valve  regurgitation is not visualized.   6. The inferior vena cava is normal in size with greater than 50% respiratory variability, suggesting right atrial pressure of 3 mmHg.    ASSESSMENT AND PLAN:  #) SVT  #) SOB #) Asthma Significant burden of SVT by recent monitor  Main symptoms are SOB and fatigue Patient has seen pulm for further asthma eval with multiple medication adjustments, with no improvement in SOB Discussed that symptoms may be related to arrhythmia or may be another medical process all together. Patient and daughter wished to attempt to lower SVT burden to see if related Bradycardic on 50mg  toprol, so unable to increase dose; will stop Start dilt 120mg  daily  Discussed AAD options are limited with Celexa. Recommended she discuss alternative anti-anxiety medication with PCP prior to initiating Class 1C (flecainide or propafenone)     Current medicines are reviewed at length with the patient today.   The patient has concerns regarding her medicines.  The following changes were made today:   STOP metoprolol xl START diltiazem CD 120mg  daily  Labs/ tests ordered today include:  Orders Placed This Encounter  Procedures   EKG 12-Lead     Disposition: Follow up with EP APP in  3-4 wks    Signed, Sherie Don, NP  06/14/22  2:35 PM  Electrophysiology [facility]

## 2022-06-14 ENCOUNTER — Encounter: Payer: Self-pay | Admitting: Cardiology

## 2022-06-14 ENCOUNTER — Ambulatory Visit: Payer: Medicare Other | Attending: Cardiology | Admitting: Cardiology

## 2022-06-14 VITALS — BP 125/63 | HR 52 | Ht 64.0 in | Wt 190.0 lb

## 2022-06-14 DIAGNOSIS — R0602 Shortness of breath: Secondary | ICD-10-CM

## 2022-06-14 DIAGNOSIS — I471 Supraventricular tachycardia, unspecified: Secondary | ICD-10-CM | POA: Insufficient documentation

## 2022-06-14 DIAGNOSIS — R5383 Other fatigue: Secondary | ICD-10-CM | POA: Insufficient documentation

## 2022-06-14 MED ORDER — DILTIAZEM HCL ER COATED BEADS 120 MG PO CP24: 120.0000 mg | ORAL_CAPSULE | Freq: Every day | ORAL | 1 refills | Status: DC

## 2022-06-14 NOTE — Patient Instructions (Signed)
Medication Instructions:   Your physician has recommended you make the following change in your medication:   START Diltiazem 120 mg tablet by mouth once daily.  STOP Metoprolol Succinate.  *If you need a refill on your cardiac medications before your next appointment, please call your pharmacy*   Lab Work:  No labs ordered today.  If you have labs (blood work) drawn today and your tests are completely normal, you will receive your results only by: MyChart Message (if you have MyChart) OR A paper copy in the mail If you have any lab test that is abnormal or we need to change your treatment, we will call you to review the results.   Testing/Procedures:  No testing ordered today.   Follow-Up: At [facility], you and your health needs are our priority.  As part of our continuing mission to provide you with exceptional heart care, we have created designated Provider Care Teams.  These Care Teams include your primary Cardiologist (physician) and Advanced Practice Providers (APPs -  Physician Assistants and Nurse Practitioners) who all work together to provide you with the care you need, when you need it.  We recommend signing up for the patient portal called "MyChart".  Sign up information is provided on this After Visit Summary.  MyChart is used to connect with patients for Virtual Visits (Telemedicine).  Patients are able to view lab/test results, encounter notes, upcoming appointments, etc.  Non-urgent messages can be sent to your provider as well.   To learn more about what you can do with MyChart, go to [email or web address].    Your next appointment:   3 week(s)  Provider:   Sherie Don, NP

## 2022-06-23 ENCOUNTER — Ambulatory Visit: Payer: Medicare Other | Attending: Internal Medicine | Admitting: Internal Medicine

## 2022-06-23 ENCOUNTER — Encounter: Payer: Self-pay | Admitting: Internal Medicine

## 2022-06-23 VITALS — BP 102/67 | HR 68 | Ht 64.0 in | Wt 185.2 lb

## 2022-06-23 DIAGNOSIS — I471 Supraventricular tachycardia, unspecified: Secondary | ICD-10-CM | POA: Insufficient documentation

## 2022-06-23 DIAGNOSIS — R0602 Shortness of breath: Secondary | ICD-10-CM

## 2022-06-23 DIAGNOSIS — R079 Chest pain, unspecified: Secondary | ICD-10-CM | POA: Insufficient documentation

## 2022-06-23 NOTE — Progress Notes (Unsigned)
Follow-up Outpatient Visit Date: 06/23/2022  Primary Care Provider: Patrice Paradise, MD [address] [facility] [place] [address]  Chief Complaint: Shortness of breath  HPI:  Courtney Wilcox is a 77 y.o. female with history of supraventricular tachycardia, pulmonary embolism (11/2020), rheumatoid arthritis, and obstructive sleep apnea, who presents for follow-up of shortness of breath and supraventricular tachycardia.  I last saw her in mid April, at which time she reported less chest discomfort after initiation of pantoprazole.  She continued to complain of shortness of breath and wheezing.  She was noted to have episodes of SVT while in the office.  Subsequent event monitor showed numerous episodes of supraventricular tachycardia lasting up to 40 minutes at a time.  She was seen in follow-up by Levy Sjogren, NP, in the EP clinic 1.5 weeks ago.  At that time she continued to complain of shortness of breath walking short distances.  She also noted slight chest pressure and intermittent palpitations.  She was switched from metoprolol to diltiazem.  Antiarrhythmic drug options were limited with citalopram use.  She was advised to speak with her PCP about switching to an alternative anxiety regimen.  Today, Ms. Ellithorpe reports that she feels even more short of breath than at prior visits.  She is attributing this to her asthma and recent allergies.  She notes some wheezing.  She has been taking Singulair and an over-the-counter antihistamine as well as her nebulizer treatments.  She also reports quite a bit of nasal/sinus congestion.  She has not had any fevers, chills, or cough.  She continues to have intermittent chest pain that is most pronounced after eating.  This has improved with regular pantoprazole use.  She later notes vague chest pressure that is present much of the day and is unrelated to specific activities.  She also has occasional palpitations.  Ms. Adami has not  started taking the diltiazem recommended in the lieu of metoprolol by Levy Sjogren, NP, at their last visit.  She was concerned about potential side effects and interactions with other medications.  --------------------------------------------------------------------------------------------------  Cardiovascular History & Procedures: Cardiovascular Problems: Supraventricular tachycardia Shortness of breath Chest pain   Risk Factors: Age greater than 48, family history, and obesity   Cath/PCI: LHC (04/16/2003): Normal coronary arteries LVEF 60-65%.  LVEDP 17 mmHg.   CV Surgery: None   EP Procedures and Devices: 14-day event monitor (04/28/2022): Sinus rhythm with numerous episodes of supraventricular tachycardia lasting up to 40 minutes with a maximum rate of 222 bpm.   Non-Invasive Evaluation(s): TTE (10/31/2021): Normal LV size with mild LVH.  LVEF 60-65% with grade 2 diastolic dysfunction.  Normal RV size and function.  Severe left atrial enlargement.  Mild mitral regurgitation.  Trivial tricuspid regurgitation.  Normal aortic valve.  Normal CVP.  Recent CV Pertinent Labs: Lab Results  Component Value Date   INR 1.0 11/19/2020   BNP 181.1 (H) 08/24/2021   K 3.3 (L) 12/07/2021   MG 1.9 07/13/2021   BUN 10 12/07/2021   CREATININE 1.12 (H) 12/07/2021    Past medical and surgical history were reviewed and updated in EPIC.  Current Meds  Medication Sig   acetaminophen (TYLENOL) 500 MG tablet Take 1,000 mg by mouth 3 (three) times daily as needed for mild pain.    ALPRAZolam (XANAX) 0.5 MG tablet Take by mouth daily as needed.   apixaban (ELIQUIS) 5 MG TABS tablet Take 1 tablet (5 mg total) by mouth 2 (two) times daily.  Cholecalciferol (VITAMIN D) 2000 units tablet Take 4,000 Units by mouth daily.    citalopram (CELEXA) 20 MG tablet Take 20 mg by mouth every evening.    Cyanocobalamin 1000 MCG/ML KIT Inject as directed every 30 (thirty) days.   diltiazem (CARDIZEM CD) 120  MG 24 hr capsule Take 1 capsule (120 mg total) by mouth daily.   Dupilumab 300 MG/2ML SOPN Inject 4 mLs into the skin every 14 (fourteen) days.   EPINEPHrine 0.3 mg/0.3 mL IJ SOAJ injection Inject 0.3 mg into the muscle as needed.   famotidine (PEPCID) 20 MG tablet Take by mouth daily as needed.   hydroxychloroquine (PLAQUENIL) 200 MG tablet Take 1 tablet (200 mg total) by mouth daily.   Iron-Vitamin C 65-125 MG TABS Take 1 tablet by mouth daily.   leflunomide (ARAVA) 20 MG tablet Take 10 mg by mouth daily.   levalbuterol (XOPENEX) 0.63 MG/3ML nebulizer solution as needed for wheezing.   meclizine (ANTIVERT) 12.5 MG tablet PLEASE SEE ATTACHED FOR DETAILED DIRECTIONS   montelukast (SINGULAIR) 10 MG tablet Take 10 mg by mouth daily as needed (for allergies.).    nitrofurantoin, macrocrystal-monohydrate, (MACROBID) 100 MG capsule Take 1 capsule by mouth 2 (two) times daily.   nystatin (MYCOSTATIN) 100000 UNIT/ML suspension Take 5 mLs by mouth as needed.   pantoprazole (PROTONIX) 40 MG tablet Take 40 mg by mouth 2 (two) times daily.   potassium chloride SA (KLOR-CON M) 20 MEQ tablet Take 2 tablets (40 mEq total) by mouth every other day. With your Lasix.   predniSONE (DELTASONE) 10 MG tablet Take 10 mg by mouth daily.   Simethicone 180 MG CAPS Take 1-2 capsules by mouth as needed.   tiZANidine (ZANAFLEX) 2 MG tablet Take 1 mg by mouth at bedtime.   triamcinolone (NASACORT) 55 MCG/ACT AERO nasal inhaler Place 2 sprays into the nose daily.    Allergies: Lactose, Other, Biaxin [clarithromycin], Hydrocodone-acetaminophen, Oxycodone, Sulfa antibiotics, and Sulfasalazine  Social History   Tobacco Use   Smoking status: Never   Smokeless tobacco: Never  Vaping Use   Vaping Use: Never used  Substance Use Topics   Alcohol use: No   Drug use: No    Family History  Problem Relation Age of Onset   Asthma Mother    Breast cancer Mother 76   Emphysema Father        smoked   Lung cancer Father         smoked   Coronary artery disease Father 67    Review of Systems: A 12-system review of systems was performed and was negative except as noted in the HPI.  --------------------------------------------------------------------------------------------------  Physical Exam: BP 102/67 (BP Location: Left Arm, Patient Position: Sitting, Cuff Size: Normal) Comment (BP Location): forearm  Pulse 68   Ht 5\' 4"  (1.626 m)   Wt 185 lb 3.2 oz (84 kg)   SpO2 95%   BMI 31.79 kg/m   General:  NAD. Neck: No JVD or HJR. Lungs: Mildly diminished breath sounds throughout without wheezes or crackles. Heart: Regular rate and rhythm without murmurs, rubs, or gallops. Abdomen: Soft, nontender, nondistended. Extremities: No lower extremity edema.  EKG: Normal sinus rhythm with sinus arrhythmia.  No significant abnormality.  Lab Results  Component Value Date   WBC 5.6 05/12/2022   HGB 10.4 (L) 05/12/2022   HCT 32.2 (L) 05/12/2022   MCV 101.3 (H) 05/12/2022   PLT 133 (L) 05/12/2022    Lab Results  Component Value Date   NA 139 12/07/2021  K 3.3 (L) 12/07/2021   CL 105 12/07/2021   CO2 28 12/07/2021   BUN 10 12/07/2021   CREATININE 1.12 (H) 12/07/2021   GLUCOSE 80 12/07/2021   ALT 20 12/07/2021    No results found for: "CHOL", "HDL", "LDLCALC", "LDLDIRECT", "TRIG", "CHOLHDL"  --------------------------------------------------------------------------------------------------  ASSESSMENT AND PLAN: SVT: No significant palpitations noted, though frequent bouts of SVT observed on prior event monitor.  Mr. Barrientez was previously advised to switch from metoprolol to diltiazem to see if that would help control her SVT and improve some of her shortness of breath given history of obstructive lung disease and prior metoprolol use.  She has not yet made this change.  We discussed the risks and benefits of trying diltiazem and she has agreed to make the change.  Shortness of breath and chest  pain: These have been ongoing issues with chest pain improved with regular pantoprazole use.  Today, Mr. Rothbauer also reports some persistent heaviness/pressure present throughout much of the day.  I suspect her underlying lung disease is playing a role in her dyspnea and some of her chest tightness, no ischemic heart disease is also a consideration.  I do not think that pharmacologic stress testing with regadenoson would be a good option given her asthma.  If her symptoms do not improve with switching from metoprolol to diltiazem and Dr. Karna Christmas is unable to uncover any other strategies to help improve her symptoms, we will need to consider right and left heart catheterization returns for follow-up later this month.  Follow-up: Return to clinic as previously scheduled to see Sherie Don, NP, on 07/13/2022.  Follow-up with me in about 3 months.  Yvonne Kendall, MD 06/23/2022 4:15 PM

## 2022-06-23 NOTE — Patient Instructions (Addendum)
Medication Instructions:  Your physician recommends the following medication changes.  STOP TAKING: Metoprolol   START TAKING: Diltiazem 120 mg by mouth daily  *If you need a refill on your cardiac medications before your next appointment, please call your pharmacy*   Lab Work: None ordered today   Testing/Procedures: None ordered today   Follow-Up: At [facility], you and your health needs are our priority.  As part of our continuing mission to provide you with exceptional heart care, we have created designated Provider Care Teams.  These Care Teams include your primary Cardiologist (physician) and Advanced Practice Providers (APPs -  Physician Assistants and Nurse Practitioners) who all work together to provide you with the care you need, when you need it.  We recommend signing up for the patient portal called "MyChart".  Sign up information is provided on this After Visit Summary.  MyChart is used to connect with patients for Virtual Visits (Telemedicine).  Patients are able to view lab/test results, encounter notes, upcoming appointments, etc.  Non-urgent messages can be sent to your provider as well.   To learn more about what you can do with MyChart, go to [email or web address].    Your next appointment:   3 month(s)  Provider:   Yvonne Kendall, MD    Keep scheduled appointment with Sherie Don, NP on 07/14/22 @ 2 pm

## 2022-06-25 ENCOUNTER — Encounter: Payer: Self-pay | Admitting: Internal Medicine

## 2022-07-05 ENCOUNTER — Telehealth: Payer: Self-pay | Admitting: Cardiology

## 2022-07-05 NOTE — Telephone Encounter (Signed)
Pt c/o medication issue:  1. Name of Medication: Diltiazem  2. How are you currently taking this medication (dosage and times per day)? 1 daaily  3. Are you having a reaction (difficulty breathing--STAT)?   4. What is your medication issue?daughter says patient seems to be feeling worse> Her shortness of breath have increased and tachycardia have  also increased. She says Bjorn Loser says she feels very shaky

## 2022-07-05 NOTE — Telephone Encounter (Signed)
Called and spoke with daughter. She reports that the patient has had increase heart rates and shortness of breath over the past 4 days. Daughter says that heart rate has been 120's. She says that today the patient went for a pulmonology test and it had to be canceled due to her heart rate getting to 160.  Daughter advised to take the patient to the ED due to the elevated heart and shortness of breath. Daughter verbalizes understanding.

## 2022-07-07 ENCOUNTER — Telehealth: Payer: Self-pay | Admitting: Internal Medicine

## 2022-07-07 MED ORDER — DILTIAZEM HCL ER COATED BEADS 180 MG PO CP24: 180.0000 mg | ORAL_CAPSULE | Freq: Every day | ORAL | 3 refills | Status: DC

## 2022-07-07 NOTE — Telephone Encounter (Signed)
Spoke to patient and informed her of the provider's recommendations as follows:  "If she feels like it is the medication itself and feels poorly even when her HRs are normal, would change back to toprol.  If she only feels poorly when her heart is racing, would actually INCREASE diltiazem to 180 mg daily to see if it better controls her arryhtmia.  Either way would stress importance of keeping f/u with suzann next week."   New Rx sent to patient's pharmacy of choice  Patient stated that she will keep follow-up visit next week

## 2022-07-07 NOTE — Telephone Encounter (Signed)
Pt c/o medication issue:  1. Name of Medication: diltiazem (CARDIZEM CD) 120 MG 24 hr capsule   2. How are you currently taking this medication (dosage and times per day)? As prescribed   3. Are you having a reaction (difficulty breathing--STAT)? Yes   4. What is your medication issue? Patient states that she is jittery and overall does not feel well. She states that her HR is going from 60-135 and would like to speak with someone in regards to this.

## 2022-07-14 ENCOUNTER — Ambulatory Visit: Payer: Medicare Other | Attending: Cardiology | Admitting: Cardiology

## 2022-07-14 ENCOUNTER — Encounter: Payer: Self-pay | Admitting: Cardiology

## 2022-07-14 ENCOUNTER — Other Ambulatory Visit
Admission: RE | Admit: 2022-07-14 | Discharge: 2022-07-14 | Disposition: A | Discharge status: Home / Self Care | Payer: Medicare Other | Source: Ambulatory Visit | Attending: Cardiology | Admitting: Cardiology

## 2022-07-14 VITALS — BP 130/78 | HR 96 | Ht 64.0 in | Wt 189.0 lb

## 2022-07-14 DIAGNOSIS — R002 Palpitations: Secondary | ICD-10-CM

## 2022-07-14 DIAGNOSIS — R0602 Shortness of breath: Secondary | ICD-10-CM | POA: Insufficient documentation

## 2022-07-14 DIAGNOSIS — I471 Supraventricular tachycardia, unspecified: Secondary | ICD-10-CM | POA: Insufficient documentation

## 2022-07-14 DIAGNOSIS — R5383 Other fatigue: Secondary | ICD-10-CM | POA: Diagnosis present

## 2022-07-14 LAB — TSH: TSH: 1.762 u[IU]/mL (ref 0.350–4.500)

## 2022-07-14 LAB — BASIC METABOLIC PANEL
Anion gap: 8 (ref 5–15)
BUN: 19 mg/dL (ref 8–23)
CO2: 25 mmol/L (ref 22–32)
Calcium: 8.8 mg/dL — ABNORMAL LOW (ref 8.9–10.3)
Chloride: 107 mmol/L (ref 98–111)
Creatinine, Ser: 1.47 mg/dL — ABNORMAL HIGH (ref 0.44–1.00)
GFR, Estimated: 37 mL/min — ABNORMAL LOW (ref >60)
Glucose, Bld: 85 mg/dL (ref 70–99)
Potassium: 3.8 mmol/L (ref 3.5–5.1)
Sodium: 140 mmol/L (ref 135–145)

## 2022-07-14 LAB — T4, FREE: Free T4: 0.77 ng/dL (ref 0.61–1.12)

## 2022-07-14 NOTE — Patient Instructions (Signed)
Medication Instructions:  Your physician recommends that you continue on your current medications as directed. Please refer to the Current Medication list given to you today.  *If you need a refill on your cardiac medications before your next appointment, please call your pharmacy*   Lab Work: TSH, Free T4, and magnesium level - Please go to the [facility]. You will check in at the front desk to the right as you walk into the atrium. Valet Parking is offered if needed. - No appointment needed. You may go any day between 7 am and 6 pm.  If you have labs (blood work) drawn today and your tests are completely normal, you will receive your results only by: MyChart Message (if you have MyChart) OR A paper copy in the mail If you have any lab test that is abnormal or we need to change your treatment, we will call you to review the results.   Testing/Procedures: No testing ordered   Follow-Up: At [facility], you and your health needs are our priority.  As part of our continuing mission to provide you with exceptional heart care, we have created designated Provider Care Teams.  These Care Teams include your primary Cardiologist (physician) and Advanced Practice Providers (APPs -  Physician Assistants and Nurse Practitioners) who all work together to provide you with the care you need, when you need it.  We recommend signing up for the patient portal called "MyChart".  Sign up information is provided on this After Visit Summary.  MyChart is used to connect with patients for Virtual Visits (Telemedicine).  Patients are able to view lab/test results, encounter notes, upcoming appointments, etc.  Non-urgent messages can be sent to your provider as well.   To learn more about what you can do with MyChart, go to [email or web address].    Your next appointment:   3 month(s)  Provider:   Sherie Don, NP

## 2022-07-14 NOTE — Progress Notes (Signed)
Cardiology Office Note Date:  07/14/2022  Patient ID:  Courtney Wilcox, Model [birth year], MRN [id number] PCP:  Patrice Paradise, MD  Cardiologist:  Yvonne Kendall, MD Electrophysiologist: Lanier Prude, MD    Chief Complaint: 6 month follow-up SVT  History of Present Illness: Courtney Wilcox is a 77 y.o. female with PMH notable for asthma, SVT, DVT/PE, SOB, fibromyalgia; seen today for Lanier Prude, MD for routine electrophysiology followup.  Last saw Dr. Lalla Brothers 10/2021 was having severe SOB with exertion and wheezing, he though more of a pulm process and referred to pulm. Has since been evaluated by [facility] with multiple medication adjustments with little to no improvement. She saw Dr. Okey Dupre 04/2022, with continued SOB symptoms. He ordered 2 week zio monitor and referral back to EP. Zio monitor revealed significant SVT burden.  I saw her about a month ago, discussed AAD options limited by lung dz, and medication interactions w Celexa. Recommended switching from BB to diltiazem to see if has better control, and to speak with PCP regarding alternative to celexa.  She sent a message to clinic about 2 weeks ago that she had started dilt, but was having jitteriness and elevated HR. Dilt dose increased 120  180 via telephone msg.   In follow-up today, the patient has had improvement since increasing from 120  180mg  diltiazem. However, when comparing toprol to 180mg  diltiazem, she thinks there has only been a slight improvement, if any, in her palpitations and SOB. She has only been taking 180mg  dilt for about a week.  She saw nephrology yesterday and had updated labwork.  She has not discussed adjusting anti-anxiety with PCP, was planning to do that after trailing diltiazem as she has been on celexa for many years and is stable on it.  She states that Dr. Okey Dupre had mentioned obtaining a LHC and questions the purpose of that test.   She denies chest pain, PND, orthopnea, nausea,  vomiting, dizziness, syncope, edema, weight gain, or early satiety.    Past Medical History:  Diagnosis Date   Allergic rhinitis    Anemia    Anxiety    Arthritis    Asthma    Atrophic vaginitis    B12 deficiency    Bronchitis, chronic (HCC)    Cervical disc disease    Collagen vascular disease (HCC)    RA   Depression    Fibrocystic breast disease    Foot ulcer (HCC)    GERD (gastroesophageal reflux disease)    Hammer toe    Hyperlipidemia    IBS (irritable bowel syndrome)    Macular degeneration 2023   Obesity    Obstructive sleep apnea    OSA (obstructive sleep apnea)    Osteoarthrosis    Multi site   Osteopenia    Rheumatoid arthritis (HCC)    Shingles    Thyroid nodule    MULTIPLE   Tonsillitis    recurrent   Umbilical hernia     Past Surgical History:  Procedure Laterality Date   BACK SURGERY  2011   LAMINECTOMY   BREAST BIOPSY Right    neg   CERVICAL FUSION  2011, 2012   X 2   COLONOSCOPY  2005, 2015   COLONOSCOPY WITH PROPOFOL N/A 08/04/2020   Procedure: COLONOSCOPY WITH PROPOFOL;  Surgeon: Earline Mayotte, MD;  Location: [facility] ENDOSCOPY;  Service: Endoscopy;  Laterality: N/A;   ESOPHAGOGASTRODUODENOSCOPY (EGD) WITH PROPOFOL N/A 09/26/2021   Procedure: ESOPHAGOGASTRODUODENOSCOPY (EGD) WITH PROPOFOL;  Surgeon: Jaynie Collins, DO;  Location: [facility] ENDOSCOPY;  Service: Gastroenterology;  Laterality: N/A;   ESOPHAGOGASTRODUODENOSCOPY ENDOSCOPY  2005, 2013, 2015   EYE SURGERY Bilateral    Cataract Extraction with IOL    HERNIA REPAIR     JOINT REPLACEMENT     KNEE ARTHROPLASTY Left 07/12/2016   Procedure: COMPUTER ASSISTED TOTAL KNEE ARTHROPLASTY;  Surgeon: Donato Heinz, MD;  Location: [facility] ORS;  Service: Orthopedics;  Laterality: Left;   Laryngeal tear after intubation w/repair     LUMBAR DISC SURGERY  12/2009   REPLACEMENT TOTAL KNEE Right 06/2007   DR. HOOTEN, [facility]   REPLACEMENT UNICONDYLAR JOINT KNEE     DR. HOOTEN, [facility]   SHOULDER  ARTHROSCOPY WITH ROTATOR CUFF REPAIR Right    TONSILLECTOMY     TOTAL HIP ARTHROPLASTY Right 01/31/2017   Procedure: TOTAL HIP ARTHROPLASTY;  Surgeon: Donato Heinz, MD;  Location: [facility] ORS;  Service: Orthopedics;  Laterality: Right;   TUBAL LIGATION     UPPER GI ENDOSCOPY      Current Outpatient Medications  Medication Instructions   acetaminophen (TYLENOL) 1,000 mg, Oral, 3 times daily PRN   ALPRAZolam (XANAX) 0.5 MG tablet Oral, Daily PRN   apixaban (ELIQUIS) 5 mg, Oral, 2 times daily   citalopram (CELEXA) 20 mg, Oral, Every evening   Cyanocobalamin 1000 MCG/ML KIT Injection, Every 30 days   diltiazem (CARDIZEM CD) 180 mg, Oral, Daily   Dupilumab 300 MG/2ML SOPN 4 mLs, Subcutaneous, Every 14 days   EPINEPHrine (EPI-PEN) 0.3 mg, Intramuscular, As needed   famotidine (PEPCID) 20 MG tablet Oral, Daily PRN   hydroxychloroquine (PLAQUENIL) 200 mg, Oral, Daily   Iron-Vitamin C 65-125 MG TABS 1 tablet, Oral, Daily   leflunomide (ARAVA) 10 mg, Oral, Daily   levalbuterol (XOPENEX) 0.63 MG/3ML nebulizer solution As needed   meclizine (ANTIVERT) 12.5 MG tablet PLEASE SEE ATTACHED FOR DETAILED DIRECTIONS   montelukast (SINGULAIR) 10 mg, Oral, Daily PRN   nystatin (MYCOSTATIN) 100000 UNIT/ML suspension 5 mLs, Oral, As needed   pantoprazole (PROTONIX) 40 mg, Oral, 2 times daily   potassium chloride SA (KLOR-CON M) 20 MEQ tablet 40 mEq, Oral, Every other day, With your Lasix.   predniSONE (DELTASONE) 10 mg, Oral, Daily   Simethicone 180 MG CAPS 1-2 capsules, Oral, As needed   triamcinolone (NASACORT) 55 MCG/ACT AERO nasal inhaler 2 sprays, Nasal, Daily   Vitamin D 4,000 Units, Oral, Daily   ZENPEP 25000-79000 units CPEP Oral, Occasionally takes with a large meal    Social History:  The patient  reports that she has never smoked. She has never used smokeless tobacco. She reports that she does not drink alcohol and does not use drugs.   Family History:   The patient's family history includes  Asthma in her mother; Breast cancer (age of onset: 41) in her mother; Coronary artery disease (age of onset: 21) in her father; Emphysema in her father; Lung cancer in her father.  ROS:  Please see the history of present illness. All other systems are reviewed and otherwise negative.   PHYSICAL EXAM:  VS:  BP 130/78 (BP Location: Right Arm, Patient Position: Sitting, Cuff Size: Large)   Pulse 96   Ht 5\' 4"  (1.626 m)   Wt 189 lb (85.7 kg)   SpO2 98%   BMI 32.44 kg/m  BMI: Body mass index is 32.44 kg/m.  GEN- The patient is well appearing, alert and oriented x 3 today.   Lungs- Clear to ausculation bilaterally, normal  work of breathing.  Heart-  Irregular  rate and rhythm, no murmurs, rubs or gallops Extremities- No peripheral edema, warm, dry   EKG is ordered. Personal review of EKG from today shows: EKG Interpretation Date/Time:  Friday July 14 2022 14:05:37 EDT Ventricular Rate:  96 PR Interval:  160 QRS Duration:  94 QT Interval:  398 QTC Calculation: 502 R Axis:   -24  Text Interpretation: Sinus rhythm with marked sinus arrhythmia Nonspecific ST abnormality Confirmed by Sherie Don [phone]) on 07/14/2022 2:13:07 PM    Recent Labs: 08/24/2021: B Natriuretic Peptide 181.1 12/07/2021: ALT 20; BUN 10; Creatinine, Ser 1.12; Potassium 3.3; Sodium 139 05/12/2022: Hemoglobin 10.4; Platelets 133  No results found for requested labs within last 365 days.   CrCl cannot be calculated (Patient's most recent lab result is older than the maximum 21 days allowed.).   Wt Readings from Last 3 Encounters:  07/14/22 189 lb (85.7 kg)  06/23/22 185 lb 3.2 oz (84 kg)  06/14/22 190 lb (86.2 kg)     Additional studies reviewed include: Previous EP, cardiology notes.   Cardiac Tele monitoring, 06/06/2022   The patient was monitored for 14 days.   The predominant rhythm was sinus with an average rate of 57 bpm while in sinus rhythm (range 42-101 bpm).   There were rare PACs and PVCs.    25,709 episode of supraventricular tachycardia cardia occurred, lasting up to 40:12 minutes with a maximum rate of 222 bpm.   A single 5 beat run of nonsustained ventricular tachycardia was observed with a maximum rate of 174 bpm.   There was no prolonged pause.   Patient triggered events corresponded to sinus rhythm, SVT, and PACs.  TTE, 10/31/2021 1. Left ventricular ejection fraction, by estimation, is 60 to 65%. The left ventricle has normal function. The left ventricle has no regional wall motion abnormalities. There is mild left ventricular hypertrophy. Left ventricular diastolic parameters are consistent with Grade II diastolic dysfunction (pseudonormalization).   2. Right ventricular systolic function is normal. The right ventricular size is normal.   3. Left atrial size was severely dilated.   4. The mitral valve is normal in structure. Mild mitral valve regurgitation.   5. The aortic valve is tricuspid. Aortic valve regurgitation is not visualized.   6. The inferior vena cava is normal in size with greater than 50% respiratory variability, suggesting right atrial pressure of 3 mmHg.    ASSESSMENT AND PLAN:  #) SVT  #) SOB #) Asthma Significant burden of SVT by recent monitor  Main symptoms are SOB and fatigue with some slight chest discomfort Unclear whether symptoms are primarily driven by arrhythmia, asthma, or another medical condition.  Long discussion with patient and daughter about diltiazem dosage and whether to increase. Patient prefers to give current dose another week to see if continues to improve and I think this is reasonable.  Will call her in a week to re-assess - consider increasing dilt vs repeat zio to re-assess Update thyroid and mag levels today - consider LHC to confirm no CAD, pulmHTN   Current medicines are reviewed at length with the patient today.   The patient has concerns regarding her medicines.  The following changes were made today:    none  Labs/ tests ordered today include:  Orders Placed This Encounter  Procedures   Basic Metabolic Panel (BMET)   TSH   T4, free   EKG 12-Lead     Disposition: Follow up with EP APP in 3  months Follow-up phone call next week to reassess    Signed, Sherie Don, NP  07/14/22  4:10 PM  Electrophysiology [facility]

## 2022-07-18 ENCOUNTER — Telehealth: Payer: Self-pay | Admitting: Internal Medicine

## 2022-07-18 NOTE — Telephone Encounter (Signed)
-----   Message from Darene Lamer, LPN sent at 01/21/1094  4:26 PM EDT ----- Regarding: RE: Question in regards to testing Hey!  This patient should have the LEA SEG completed- the order was placed back in January during that visit- but has never had ABI before so we will go ahead with the SEGMENTAL scan order.   Thanks!  ----- Message ----- From: Shawna Orleans Sent: 07/17/2022   3:24 PM EDT To: Parke Poisson, RN Subject: Question in regards to testing                 This lady has 2 orders for LEA just wondering if this pt still need it , It was not mentioned in the last AVS for Dr. Okey Dupre

## 2022-07-18 NOTE — Telephone Encounter (Signed)
Pt said she don't want test, she has already been here and has a lot going on in her life at this time.

## 2022-07-21 ENCOUNTER — Telehealth: Payer: Self-pay | Admitting: Cardiology

## 2022-07-21 NOTE — Telephone Encounter (Signed)
I attempted to call patient to follow-up after our clinic visit last week.  She had wanted to give diltiazem another week prior to increasing dose.  Further, she is scheduled with Dr. Okey Dupre next week for [facility] discussion.  No answer, LM when able.  Also sent mychart msg.

## 2022-07-26 ENCOUNTER — Ambulatory Visit: Payer: Medicare Other | Attending: Internal Medicine | Admitting: Internal Medicine

## 2022-07-26 ENCOUNTER — Encounter: Payer: Self-pay | Admitting: Internal Medicine

## 2022-07-26 NOTE — Progress Notes (Deleted)
  Cardiology Office Note:  .   Date:  07/26/2022  ID:  Courtney Wilcox, DOB August 18, 1945, MRN [id number] PCP: Patrice Paradise, MD  [facility] Providers Cardiologist:  Yvonne Kendall, MD Electrophysiologist:  Lanier Prude, MD     History of Present Illness: .   Courtney Wilcox is a 77 y.o. female with history of supraventricular tachycardia, pulmonary embolism (11/2020), rheumatoid arthritis, and obstructive sleep apnea, who presents for follow-up of shortness of breath.  I last saw her a month ago, at which time she complained of progressive shortness of breath that she attributed to asthma and recent allergies.  She also reported intermittent chest pain associated with eating that seem to improve with regular pantoprazole use.  She had not yet switched from metoprolol to diltiazem as previously recommended by EP.  I encouraged her to make this switch.  She was seen by Courtney Don, NP, in the EP clinic 2 weeks ago, at which time she reported only slight improvement in her palpitations and shortness of breath following switch from metoprolol to diltiazem.  ROS: See HPI  Studies Reviewed: .        14-day event monitor (04/28/2022): Sinus rhythm with numerous episodes of SVT lasting up to 40 minutes with a maximum rate of 222 bpm.  Rare PACs and PVCs as well as single brief run of NSVT also noted.  TTE (10/31/2021): Normal LV size with mild LVH.  LVEF 60-65% with normal wall motion and grade 2 diastolic dysfunction.  Normal RV size and function.  Severe left atrial enlargement.  Normal right atrial size.  No pericardial effusion.  Mild mitral regurgitation.  Trivial tricuspid regurgitation.  Normal aortic valve.  Normal CVP.  Risk Assessment/Calculations:   {Does this patient have ATRIAL FIBRILLATION?:[id number]} No BP recorded.  {Refresh Note OR Click here to enter BP  :1}***       Physical Exam:   VS:  There were no vitals taken for this visit.   Wt Readings from Last 3  Encounters:  07/14/22 189 lb (85.7 kg)  06/23/22 185 lb 3.2 oz (84 kg)  06/14/22 190 lb (86.2 kg)    General:  NAD. Neck: No JVD or HJR. Lungs: Clear to auscultation bilaterally without wheezes or crackles. Heart: Regular rate and rhythm without murmurs, rubs, or gallops. Abdomen: Soft, nontender, nondistended. Extremities: No lower extremity edema.  ASSESSMENT AND PLAN: .    ***    {Are you ordering a CV Procedure (e.g. stress test, cath, DCCV, TEE, etc)?   Press F2        :[id number]}  Dispo: ***  Signed, Yvonne Kendall, MD

## 2022-08-05 ENCOUNTER — Other Ambulatory Visit: Payer: Self-pay | Admitting: Cardiology

## 2022-09-08 ENCOUNTER — Inpatient Hospital Stay: Payer: Medicare Other | Attending: Oncology

## 2022-09-08 DIAGNOSIS — N189 Chronic kidney disease, unspecified: Secondary | ICD-10-CM | POA: Insufficient documentation

## 2022-09-08 DIAGNOSIS — D631 Anemia in chronic kidney disease: Secondary | ICD-10-CM | POA: Insufficient documentation

## 2022-09-08 DIAGNOSIS — D539 Nutritional anemia, unspecified: Secondary | ICD-10-CM

## 2022-09-08 LAB — IRON AND TIBC
Iron: 105 ug/dL (ref 28–170)
Saturation Ratios: 42 % — ABNORMAL HIGH (ref 10.4–31.8)
TIBC: 252 ug/dL (ref 250–450)
UIBC: 147 ug/dL

## 2022-09-08 LAB — CBC WITH DIFFERENTIAL (CANCER CENTER ONLY)
Abs Immature Granulocytes: 0.02 10*3/uL (ref 0.00–0.07)
Basophils Absolute: 0.1 10*3/uL (ref 0.0–0.1)
Basophils Relative: 1 %
Eosinophils Absolute: 0.1 10*3/uL (ref 0.0–0.5)
Eosinophils Relative: 2 %
HCT: 31.5 % — ABNORMAL LOW (ref 36.0–46.0)
Hemoglobin: 10 g/dL — ABNORMAL LOW (ref 12.0–15.0)
Immature Granulocytes: 0 %
Lymphocytes Relative: 20 %
Lymphs Abs: 1.1 10*3/uL (ref 0.7–4.0)
MCH: 33 pg (ref 26.0–34.0)
MCHC: 31.7 g/dL (ref 30.0–36.0)
MCV: 104 fL — ABNORMAL HIGH (ref 80.0–100.0)
Monocytes Absolute: 0.6 10*3/uL (ref 0.1–1.0)
Monocytes Relative: 11 %
Neutro Abs: 3.7 10*3/uL (ref 1.7–7.7)
Neutrophils Relative %: 66 %
Platelet Count: 159 10*3/uL (ref 150–400)
RBC: 3.03 MIL/uL — ABNORMAL LOW (ref 3.87–5.11)
RDW: 14.1 % (ref 11.5–15.5)
WBC Count: 5.6 10*3/uL (ref 4.0–10.5)
nRBC: 0 % (ref 0.0–0.2)

## 2022-09-08 LAB — RETIC PANEL
Immature Retic Fract: 9.9 % (ref 2.3–15.9)
RBC.: 3.05 MIL/uL — ABNORMAL LOW (ref 3.87–5.11)
Retic Count, Absolute: 40.3 10*3/uL (ref 19.0–186.0)
Retic Ct Pct: 1.3 % (ref 0.4–3.1)
Reticulocyte Hemoglobin: 34.2 pg (ref >27.9)

## 2022-09-08 LAB — FERRITIN: Ferritin: 93 ng/mL (ref 11–307)

## 2022-09-11 ENCOUNTER — Inpatient Hospital Stay: Payer: Medicare Other

## 2022-09-11 ENCOUNTER — Inpatient Hospital Stay (HOSPITAL_BASED_OUTPATIENT_CLINIC_OR_DEPARTMENT_OTHER): Payer: Medicare Other | Admitting: Oncology

## 2022-09-11 ENCOUNTER — Encounter: Payer: Self-pay | Admitting: Oncology

## 2022-09-11 VITALS — BP 126/75 | HR 87 | Resp 18 | Ht 64.0 in | Wt 189.0 lb

## 2022-09-11 VITALS — BP 121/49 | HR 70 | Temp 98.1°F | Resp 19

## 2022-09-11 DIAGNOSIS — D638 Anemia in other chronic diseases classified elsewhere: Secondary | ICD-10-CM | POA: Diagnosis not present

## 2022-09-11 DIAGNOSIS — M069 Rheumatoid arthritis, unspecified: Secondary | ICD-10-CM

## 2022-09-11 DIAGNOSIS — N189 Chronic kidney disease, unspecified: Secondary | ICD-10-CM | POA: Diagnosis not present

## 2022-09-11 DIAGNOSIS — D631 Anemia in chronic kidney disease: Secondary | ICD-10-CM

## 2022-09-11 MED ORDER — SODIUM CHLORIDE 0.9 % IV SOLN
200.0000 mg | Freq: Once | INTRAVENOUS | Status: AC
  Administered 2022-09-11: 200 mg via INTRAVENOUS
  Filled 2022-09-11: qty 200

## 2022-09-11 MED ORDER — SODIUM CHLORIDE 0.9 % IV SOLN
Freq: Once | INTRAVENOUS | Status: AC
  Filled 2022-09-11: qty 250

## 2022-09-11 MED ORDER — SODIUM CHLORIDE 0.9 % IV SOLN
200.0000 mg | Freq: Once | INTRAVENOUS | Status: DC
  Filled 2022-09-11: qty 10

## 2022-09-11 NOTE — Assessment & Plan Note (Addendum)
Patient is currently on leflunomide and Plaquenil. Continue follow up with rheumatology.

## 2022-09-11 NOTE — Assessment & Plan Note (Addendum)
Anemia due to chronic disease, inflammation, CKD.  Bone marrow biopsy showed Normocellular bone marrow (20-30%) with trilineage hematopoiesis and no increase in blasts no dysplasia, normal cytogenetics.  Hb is >10, no need for retacrit.  Lab Results  Component Value Date   HGB 10.0 (L) 09/08/2022   TIBC 252 09/08/2022   IRONPCTSAT 42 (H) 09/08/2022   FERRITIN 93 09/08/2022    Recommend additional Venofer weekly to further increase ferritin

## 2022-09-11 NOTE — Progress Notes (Signed)
Hematology/Oncology Progress note [phone] [phone]     REASON FOR VISIT Follow up for anemia and thrombocytopenia.    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  Bone marrow biopsy showed Normocellular bone marrow (20-30%) with trilineage hematopoiesis and no increase in blasts no dysplasia, normal cytogenetics.  Hb is >10, no need for retacrit.  Lab Results  Component Value Date   HGB 10.0 (L) 09/08/2022   TIBC 252 09/08/2022   IRONPCTSAT 42 (H) 09/08/2022   FERRITIN 93 09/08/2022    Recommend additional Venofer weekly to further increase ferritin     Rheumatoid arthritis (HCC) Patient is currently on leflunomide and Plaquenil Continue follow up with rheumatology.   Orders Placed This Encounter  Procedures   CBC with Differential (Cancer Center Only)    Standing Status:   Future    Standing Expiration Date:   09/11/2023   Iron and TIBC    Standing Status:   Future    Standing Expiration Date:   09/11/2023   Ferritin    Standing Status:   Future    Standing Expiration Date:   09/11/2023   Retic Panel    Standing Status:   Future    Standing Expiration Date:   09/11/2023    Follow up in 3 months.  All questions were answered. The patient knows to call the clinic with any problems, questions or concerns.  [name], MD, PhD [facility] Hematology Oncology 09/11/2022      INTERVAL HISTORY Courtney Wilcox is a 77 y.o. female who has above history reviewed by me today presents for follow up visit for management of pancytopenia. She has Rheumatoid arthritis, follows up with rheumatology She is on Leflutamide and on plaquenil. Leflutamide dose has been decreased to 10mg  in December 2023.  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 Cough and shortness of breath- Dupixent injection.   Review of Systems  Constitutional:   Positive for fatigue. Negative for appetite change, chills and fever.  HENT:   Negative for hearing loss and voice change.   Eyes:  Negative for eye problems.  Respiratory:  Positive for cough and shortness of breath. Negative for chest tightness.   Cardiovascular:  Negative for chest pain.  Gastrointestinal:  Negative for abdominal distention and blood in stool.  Endocrine: Negative for hot flashes.  Genitourinary:  Negative for difficulty urinating and frequency.   Musculoskeletal:  Negative for arthralgias.  Skin:  Negative for itching and rash.  Neurological:  Negative for extremity weakness.  Hematological:  Negative for adenopathy.  Psychiatric/Behavioral:  Negative for confusion.       Allergies  Allergen Reactions   Lactose Other (See Comments) and Diarrhea   Other Other (See Comments)    Pt ONLY tolerates SLOW-FE (slow released iron)---upsets IBS   Biaxin [Clarithromycin] Other (See Comments)    GI upset    Hydrocodone-Acetaminophen Itching    Tolerates acetaminophen    Oxycodone Itching   Sulfa Antibiotics Other (See Comments) and Diarrhea    GI upset  GI upset GI upset    Sulfasalazine Diarrhea     Past Medical History:  Diagnosis Date   Allergic rhinitis    Anemia    Anxiety    Arthritis    Asthma    Atrophic vaginitis    B12 deficiency    Bronchitis, chronic (HCC)    Cervical disc disease  Collagen vascular disease (HCC)    RA   Depression    Fibrocystic breast disease    Foot ulcer (HCC)    GERD (gastroesophageal reflux disease)    Hammer toe    Hyperlipidemia    IBS (irritable bowel syndrome)    Macular degeneration 2023   Obesity    Obstructive sleep apnea    OSA (obstructive sleep apnea)    Osteoarthrosis    Multi site   Osteopenia    Rheumatoid arthritis (HCC)    Shingles    Thyroid nodule    MULTIPLE   Tonsillitis    recurrent   Umbilical hernia      Past Surgical History:  Procedure Laterality Date   BACK SURGERY  2011    LAMINECTOMY   BREAST BIOPSY Right    neg   CERVICAL FUSION  2011, 2012   X 2   COLONOSCOPY  2005, 2015   COLONOSCOPY WITH PROPOFOL N/A 08/04/2020   Procedure: COLONOSCOPY WITH PROPOFOL;  Surgeon: Earline Mayotte, MD;  Location: [facility] ENDOSCOPY;  Service: Endoscopy;  Laterality: N/A;   ESOPHAGOGASTRODUODENOSCOPY (EGD) WITH PROPOFOL N/A 09/26/2021   Procedure: ESOPHAGOGASTRODUODENOSCOPY (EGD) WITH PROPOFOL;  Surgeon: Jaynie Collins, DO;  Location: [facility] ENDOSCOPY;  Service: Gastroenterology;  Laterality: N/A;   ESOPHAGOGASTRODUODENOSCOPY ENDOSCOPY  2005, 2013, 2015   EYE SURGERY Bilateral    Cataract Extraction with IOL    HERNIA REPAIR     JOINT REPLACEMENT     KNEE ARTHROPLASTY Left 07/12/2016   Procedure: COMPUTER ASSISTED TOTAL KNEE ARTHROPLASTY;  Surgeon: Donato Heinz, MD;  Location: [facility] ORS;  Service: Orthopedics;  Laterality: Left;   Laryngeal tear after intubation w/repair     LUMBAR DISC SURGERY  12/2009   REPLACEMENT TOTAL KNEE Right 06/2007   DR. HOOTEN, [facility]   REPLACEMENT UNICONDYLAR JOINT KNEE     DR. HOOTEN, [facility]   SHOULDER ARTHROSCOPY WITH ROTATOR CUFF REPAIR Right    TONSILLECTOMY     TOTAL HIP ARTHROPLASTY Right 01/31/2017   Procedure: TOTAL HIP ARTHROPLASTY;  Surgeon: Donato Heinz, MD;  Location: [facility] ORS;  Service: Orthopedics;  Laterality: Right;   TUBAL LIGATION     UPPER GI ENDOSCOPY      Social History   Socioeconomic History   Marital status: Married    Spouse name: Jillyn Hidden   Number of children: 3   Years of education: Not on file   Highest education level: Not on file  Occupational History   Not on file  Tobacco Use   Smoking status: Never   Smokeless tobacco: Never  Vaping Use   Vaping status: Never Used  Substance and Sexual Activity   Alcohol use: No   Drug use: No   Sexual activity: Not on file  Other Topics Concern   Not on file  Social History Narrative   Not on file   Social Determinants of Health   Financial Resource  Strain: Not on file  Food Insecurity: Not on file  Transportation Needs: Not on file  Physical Activity: Not on file  Stress: Not on file  Social Connections: Not on file  Intimate Partner Violence: Not on file    Family History  Problem Relation Age of Onset   Asthma Mother    Breast cancer Mother 38   Emphysema Father        smoked   Lung cancer Father        smoked   Coronary artery disease Father 41  Current Outpatient Medications:    acetaminophen (TYLENOL) 500 MG tablet, Take 1,000 mg by mouth 3 (three) times daily as needed for mild pain. , Disp: , Rfl:    ALPRAZolam (XANAX) 0.5 MG tablet, Take by mouth daily as needed., Disp: , Rfl:    apixaban (ELIQUIS) 5 MG TABS tablet, Take 1 tablet (5 mg total) by mouth 2 (two) times daily., Disp: 30 tablet, Rfl: 6   Cholecalciferol (VITAMIN D) 2000 units tablet, Take 4,000 Units by mouth daily. , Disp: , Rfl:    citalopram (CELEXA) 20 MG tablet, Take 20 mg by mouth every evening. , Disp: , Rfl: 5   Cyanocobalamin 1000 MCG/ML KIT, Inject as directed every 30 (thirty) days., Disp: , Rfl:    diltiazem (CARDIZEM CD) 180 MG 24 hr capsule, Take 1 capsule (180 mg total) by mouth daily., Disp: 90 capsule, Rfl: 3   Dupilumab 300 MG/2ML SOPN, Inject 4 mLs into the skin every 14 (fourteen) days., Disp: , Rfl:    EPINEPHrine 0.3 mg/0.3 mL IJ SOAJ injection, Inject 0.3 mg into the muscle as needed., Disp: , Rfl:    estradiol (ESTRACE) 0.1 MG/GM vaginal cream, Place vaginally., Disp: , Rfl:    famotidine (PEPCID) 20 MG tablet, Take by mouth daily as needed., Disp: , Rfl:    hydroxychloroquine (PLAQUENIL) 200 MG tablet, Take 1 tablet (200 mg total) by mouth daily., Disp: 1 tablet, Rfl: 0   Iron-Vitamin C 65-125 MG TABS, Take 1 tablet by mouth daily., Disp: 30 tablet, Rfl: 2   leflunomide (ARAVA) 20 MG tablet, Take 10 mg by mouth daily., Disp: , Rfl:    levalbuterol (XOPENEX) 0.63 MG/3ML nebulizer solution, as needed for wheezing., Disp: , Rfl:     meclizine (ANTIVERT) 12.5 MG tablet, PLEASE SEE ATTACHED FOR DETAILED DIRECTIONS, Disp: , Rfl:    montelukast (SINGULAIR) 10 MG tablet, Take 10 mg by mouth daily as needed (for allergies.). , Disp: , Rfl:    nystatin (MYCOSTATIN) 100000 UNIT/ML suspension, Take 5 mLs by mouth as needed., Disp: , Rfl:    pantoprazole (PROTONIX) 40 MG tablet, Take 40 mg by mouth 2 (two) times daily., Disp: , Rfl:    potassium chloride SA (KLOR-CON M) 20 MEQ tablet, Take 2 tablets (40 mEq total) by mouth every other day. With your Lasix., Disp: 90 tablet, Rfl: 3   predniSONE (DELTASONE) 10 MG tablet, Take 10 mg by mouth daily., Disp: , Rfl:    Simethicone 180 MG CAPS, Take 1-2 capsules by mouth as needed., Disp: , Rfl:    triamcinolone (NASACORT) 55 MCG/ACT AERO nasal inhaler, Place 2 sprays into the nose daily., Disp: , Rfl:    ZENPEP 25000-79000 units CPEP, Take by mouth. Occasionally takes with a large meal, Disp: , Rfl:   Physical exam:  Vitals:   09/11/22 1420 09/11/22 1425  BP:  126/75  Pulse:  87  Resp: 18 18  SpO2:  98%  Weight: 189 lb (85.7 kg)   Height: 5\' 4"  (1.626 m)    Physical Exam Constitutional:      General: She is not in acute distress.    Comments: She ambulates independantly  HENT:     Head: Normocephalic and atraumatic.  Eyes:     General: No scleral icterus. Cardiovascular:     Rate and Rhythm: Normal rate.  Pulmonary:     Effort: Pulmonary effort is normal. No respiratory distress.     Breath sounds: No wheezing.     Comments: Decreased breath sounds  bilaterally.  Abdominal:     General: There is no distension.     Palpations: Abdomen is soft.  Musculoskeletal:        General: No deformity. Normal range of motion.     Cervical back: Normal range of motion and neck supple.  Skin:    General: Skin is warm.  Neurological:     Mental Status: She is alert and oriented to person, place, and time. Mental status is at baseline.     Cranial Nerves: No cranial nerve deficit.      Coordination: Coordination normal.  Psychiatric:        Mood and Affect: Mood normal.        Latest Ref Rng & Units 07/14/2022    3:35 PM  CMP  Glucose 70 - 99 mg/dL 85   BUN 8 - 23 mg/dL 19   Creatinine 4.09 - 1.00 mg/dL 8.11   Sodium 914 - 782 mmol/L 140   Potassium 3.5 - 5.1 mmol/L 3.8   Chloride 98 - 111 mmol/L 107   CO2 22 - 32 mmol/L 25   Calcium 8.9 - 10.3 mg/dL 8.8       Latest Ref Rng & Units 09/08/2022    3:19 PM  CBC  WBC 4.0 - 10.5 K/uL 5.6   Hemoglobin 12.0 - 15.0 g/dL 95.6   Hematocrit 21.3 - 46.0 % 31.5   Platelets 150 - 400 K/uL 159     RADIOGRAPHIC STUDIES: I have personally reviewed the radiological images as listed and agreed with the findings in the report. No results found.

## 2022-09-15 ENCOUNTER — Other Ambulatory Visit: Payer: Medicare Other

## 2022-09-19 ENCOUNTER — Ambulatory Visit: Payer: Medicare Other | Admitting: Oncology

## 2022-09-19 ENCOUNTER — Ambulatory Visit: Payer: Medicare Other

## 2022-09-20 ENCOUNTER — Inpatient Hospital Stay: Payer: Medicare Other

## 2022-09-27 ENCOUNTER — Inpatient Hospital Stay
Admission: EM | Admit: 2022-09-27 | Discharge: 2022-10-03 | DRG: 322 | Disposition: A | Discharge status: Home / Self Care | Payer: Medicare Other | Attending: Internal Medicine | Admitting: Internal Medicine

## 2022-09-27 ENCOUNTER — Ambulatory Visit: Payer: Medicare Other | Attending: Internal Medicine | Admitting: Internal Medicine

## 2022-09-27 ENCOUNTER — Emergency Department: Payer: Medicare Other

## 2022-09-27 ENCOUNTER — Encounter: Payer: Self-pay | Admitting: Internal Medicine

## 2022-09-27 VITALS — BP 118/74 | HR 109 | Ht 62.0 in | Wt 189.0 lb

## 2022-09-27 DIAGNOSIS — Z9842 Cataract extraction status, left eye: Secondary | ICD-10-CM

## 2022-09-27 DIAGNOSIS — I2584 Coronary atherosclerosis due to calcified coronary lesion: Secondary | ICD-10-CM | POA: Diagnosis not present

## 2022-09-27 DIAGNOSIS — Z7982 Long term (current) use of aspirin: Secondary | ICD-10-CM

## 2022-09-27 DIAGNOSIS — Z538 Procedure and treatment not carried out for other reasons: Secondary | ICD-10-CM | POA: Diagnosis not present

## 2022-09-27 DIAGNOSIS — K219 Gastro-esophageal reflux disease without esophagitis: Secondary | ICD-10-CM | POA: Diagnosis present

## 2022-09-27 DIAGNOSIS — N189 Chronic kidney disease, unspecified: Secondary | ICD-10-CM | POA: Diagnosis not present

## 2022-09-27 DIAGNOSIS — D638 Anemia in other chronic diseases classified elsewhere: Secondary | ICD-10-CM | POA: Diagnosis not present

## 2022-09-27 DIAGNOSIS — R079 Chest pain, unspecified: Secondary | ICD-10-CM | POA: Diagnosis not present

## 2022-09-27 DIAGNOSIS — I471 Supraventricular tachycardia, unspecified: Secondary | ICD-10-CM | POA: Insufficient documentation

## 2022-09-27 DIAGNOSIS — Z91011 Allergy to milk products: Secondary | ICD-10-CM

## 2022-09-27 DIAGNOSIS — I4719 Other supraventricular tachycardia: Secondary | ICD-10-CM | POA: Diagnosis present

## 2022-09-27 DIAGNOSIS — J329 Chronic sinusitis, unspecified: Secondary | ICD-10-CM | POA: Diagnosis present

## 2022-09-27 DIAGNOSIS — I4892 Unspecified atrial flutter: Secondary | ICD-10-CM | POA: Diagnosis present

## 2022-09-27 DIAGNOSIS — M1909 Primary osteoarthritis, other specified site: Secondary | ICD-10-CM | POA: Diagnosis present

## 2022-09-27 DIAGNOSIS — Z888 Allergy status to other drugs, medicaments and biological substances status: Secondary | ICD-10-CM

## 2022-09-27 DIAGNOSIS — R072 Precordial pain: Secondary | ICD-10-CM

## 2022-09-27 DIAGNOSIS — Z9889 Other specified postprocedural states: Secondary | ICD-10-CM

## 2022-09-27 DIAGNOSIS — I200 Unstable angina: Principal | ICD-10-CM | POA: Diagnosis present

## 2022-09-27 DIAGNOSIS — D539 Nutritional anemia, unspecified: Secondary | ICD-10-CM | POA: Diagnosis present

## 2022-09-27 DIAGNOSIS — I25110 Atherosclerotic heart disease of native coronary artery with unstable angina pectoris: Secondary | ICD-10-CM | POA: Diagnosis present

## 2022-09-27 DIAGNOSIS — F410 Panic disorder [episodic paroxysmal anxiety] without agoraphobia: Secondary | ICD-10-CM | POA: Diagnosis present

## 2022-09-27 DIAGNOSIS — I771 Stricture of artery: Secondary | ICD-10-CM | POA: Diagnosis present

## 2022-09-27 DIAGNOSIS — N183 Chronic kidney disease, stage 3 unspecified: Secondary | ICD-10-CM | POA: Diagnosis not present

## 2022-09-27 DIAGNOSIS — K589 Irritable bowel syndrome without diarrhea: Secondary | ICD-10-CM | POA: Diagnosis present

## 2022-09-27 DIAGNOSIS — M2040 Other hammer toe(s) (acquired), unspecified foot: Secondary | ICD-10-CM | POA: Diagnosis present

## 2022-09-27 DIAGNOSIS — J8283 Eosinophilic asthma: Secondary | ICD-10-CM | POA: Diagnosis present

## 2022-09-27 DIAGNOSIS — Z79899 Other long term (current) drug therapy: Secondary | ICD-10-CM

## 2022-09-27 DIAGNOSIS — I119 Hypertensive heart disease without heart failure: Secondary | ICD-10-CM | POA: Diagnosis present

## 2022-09-27 DIAGNOSIS — Z9851 Tubal ligation status: Secondary | ICD-10-CM

## 2022-09-27 DIAGNOSIS — Z803 Family history of malignant neoplasm of breast: Secondary | ICD-10-CM

## 2022-09-27 DIAGNOSIS — J45909 Unspecified asthma, uncomplicated: Secondary | ICD-10-CM | POA: Insufficient documentation

## 2022-09-27 DIAGNOSIS — D631 Anemia in chronic kidney disease: Secondary | ICD-10-CM | POA: Diagnosis present

## 2022-09-27 DIAGNOSIS — E785 Hyperlipidemia, unspecified: Secondary | ICD-10-CM | POA: Diagnosis present

## 2022-09-27 DIAGNOSIS — I209 Angina pectoris, unspecified: Secondary | ICD-10-CM | POA: Diagnosis not present

## 2022-09-27 DIAGNOSIS — E042 Nontoxic multinodular goiter: Secondary | ICD-10-CM | POA: Diagnosis present

## 2022-09-27 DIAGNOSIS — Z825 Family history of asthma and other chronic lower respiratory diseases: Secondary | ICD-10-CM

## 2022-09-27 DIAGNOSIS — G4733 Obstructive sleep apnea (adult) (pediatric): Secondary | ICD-10-CM | POA: Diagnosis present

## 2022-09-27 DIAGNOSIS — R0602 Shortness of breath: Secondary | ICD-10-CM

## 2022-09-27 DIAGNOSIS — F419 Anxiety disorder, unspecified: Secondary | ICD-10-CM | POA: Diagnosis present

## 2022-09-27 DIAGNOSIS — E6609 Other obesity due to excess calories: Secondary | ICD-10-CM | POA: Diagnosis present

## 2022-09-27 DIAGNOSIS — Z981 Arthrodesis status: Secondary | ICD-10-CM

## 2022-09-27 DIAGNOSIS — Z801 Family history of malignant neoplasm of trachea, bronchus and lung: Secondary | ICD-10-CM

## 2022-09-27 DIAGNOSIS — Z961 Presence of intraocular lens: Secondary | ICD-10-CM | POA: Diagnosis present

## 2022-09-27 DIAGNOSIS — F32A Depression, unspecified: Secondary | ICD-10-CM | POA: Diagnosis present

## 2022-09-27 DIAGNOSIS — Z86711 Personal history of pulmonary embolism: Secondary | ICD-10-CM | POA: Diagnosis present

## 2022-09-27 DIAGNOSIS — I129 Hypertensive chronic kidney disease with stage 1 through stage 4 chronic kidney disease, or unspecified chronic kidney disease: Secondary | ICD-10-CM | POA: Diagnosis present

## 2022-09-27 DIAGNOSIS — D7589 Other specified diseases of blood and blood-forming organs: Secondary | ICD-10-CM | POA: Diagnosis present

## 2022-09-27 DIAGNOSIS — N1832 Chronic kidney disease, stage 3b: Secondary | ICD-10-CM | POA: Diagnosis present

## 2022-09-27 DIAGNOSIS — Z96641 Presence of right artificial hip joint: Secondary | ICD-10-CM | POA: Diagnosis present

## 2022-09-27 DIAGNOSIS — Z96653 Presence of artificial knee joint, bilateral: Secondary | ICD-10-CM | POA: Diagnosis present

## 2022-09-27 DIAGNOSIS — Z812 Family history of tobacco abuse and dependence: Secondary | ICD-10-CM

## 2022-09-27 DIAGNOSIS — Z8249 Family history of ischemic heart disease and other diseases of the circulatory system: Secondary | ICD-10-CM

## 2022-09-27 DIAGNOSIS — N179 Acute kidney failure, unspecified: Secondary | ICD-10-CM | POA: Diagnosis not present

## 2022-09-27 DIAGNOSIS — H3530 Unspecified macular degeneration: Secondary | ICD-10-CM | POA: Diagnosis present

## 2022-09-27 DIAGNOSIS — Z881 Allergy status to other antibiotic agents status: Secondary | ICD-10-CM

## 2022-09-27 DIAGNOSIS — Z9841 Cataract extraction status, right eye: Secondary | ICD-10-CM

## 2022-09-27 DIAGNOSIS — E66811 Obesity, class 1: Secondary | ICD-10-CM

## 2022-09-27 DIAGNOSIS — J4489 Other specified chronic obstructive pulmonary disease: Secondary | ICD-10-CM | POA: Diagnosis present

## 2022-09-27 DIAGNOSIS — Z9089 Acquired absence of other organs: Secondary | ICD-10-CM

## 2022-09-27 DIAGNOSIS — I2510 Atherosclerotic heart disease of native coronary artery without angina pectoris: Secondary | ICD-10-CM | POA: Diagnosis present

## 2022-09-27 DIAGNOSIS — G4730 Sleep apnea, unspecified: Secondary | ICD-10-CM | POA: Diagnosis present

## 2022-09-27 DIAGNOSIS — Z7902 Long term (current) use of antithrombotics/antiplatelets: Secondary | ICD-10-CM

## 2022-09-27 DIAGNOSIS — M069 Rheumatoid arthritis, unspecified: Secondary | ICD-10-CM | POA: Diagnosis present

## 2022-09-27 DIAGNOSIS — Z7901 Long term (current) use of anticoagulants: Secondary | ICD-10-CM

## 2022-09-27 DIAGNOSIS — Z8679 Personal history of other diseases of the circulatory system: Secondary | ICD-10-CM

## 2022-09-27 DIAGNOSIS — Z6834 Body mass index (BMI) 34.0-34.9, adult: Secondary | ICD-10-CM

## 2022-09-27 DIAGNOSIS — Z713 Dietary counseling and surveillance: Secondary | ICD-10-CM

## 2022-09-27 DIAGNOSIS — M8580 Other specified disorders of bone density and structure, unspecified site: Secondary | ICD-10-CM | POA: Diagnosis present

## 2022-09-27 DIAGNOSIS — Z8719 Personal history of other diseases of the digestive system: Secondary | ICD-10-CM

## 2022-09-27 DIAGNOSIS — Z955 Presence of coronary angioplasty implant and graft: Secondary | ICD-10-CM | POA: Diagnosis not present

## 2022-09-27 DIAGNOSIS — Z882 Allergy status to sulfonamides status: Secondary | ICD-10-CM

## 2022-09-27 DIAGNOSIS — Z885 Allergy status to narcotic agent status: Secondary | ICD-10-CM

## 2022-09-27 LAB — COMPREHENSIVE METABOLIC PANEL
ALT: 15 U/L (ref 0–44)
AST: 17 U/L (ref 15–41)
Albumin: 3.4 g/dL — ABNORMAL LOW (ref 3.5–5.0)
Alkaline Phosphatase: 66 U/L (ref 38–126)
Anion gap: 11 (ref 5–15)
BUN: 28 mg/dL — ABNORMAL HIGH (ref 8–23)
CO2: 22 mmol/L (ref 22–32)
Calcium: 8.7 mg/dL — ABNORMAL LOW (ref 8.9–10.3)
Chloride: 107 mmol/L (ref 98–111)
Creatinine, Ser: 1.5 mg/dL — ABNORMAL HIGH (ref 0.44–1.00)
GFR, Estimated: 36 mL/min — ABNORMAL LOW (ref >60)
Glucose, Bld: 99 mg/dL (ref 70–99)
Potassium: 3.7 mmol/L (ref 3.5–5.1)
Sodium: 140 mmol/L (ref 135–145)
Total Bilirubin: 0.6 mg/dL (ref 0.3–1.2)
Total Protein: 6 g/dL — ABNORMAL LOW (ref 6.5–8.1)

## 2022-09-27 LAB — BLOOD GAS, VENOUS
Acid-Base Excess: 3 mmol/L — ABNORMAL HIGH (ref 0.0–2.0)
Bicarbonate: 27.1 mmol/L (ref 20.0–28.0)
O2 Saturation: 77.7 %
Patient temperature: 37
pCO2, Ven: 39 mmHg — ABNORMAL LOW (ref 44–60)
pH, Ven: 7.45 — ABNORMAL HIGH (ref 7.25–7.43)
pO2, Ven: 45 mmHg (ref 32–45)

## 2022-09-27 LAB — CBC
HCT: 36.6 % (ref 36.0–46.0)
Hemoglobin: 11.6 g/dL — ABNORMAL LOW (ref 12.0–15.0)
MCH: 32.9 pg (ref 26.0–34.0)
MCHC: 31.7 g/dL (ref 30.0–36.0)
MCV: 103.7 fL — ABNORMAL HIGH (ref 80.0–100.0)
Platelets: 198 10*3/uL (ref 150–400)
RBC: 3.53 MIL/uL — ABNORMAL LOW (ref 3.87–5.11)
RDW: 13.9 % (ref 11.5–15.5)
WBC: 9.5 10*3/uL (ref 4.0–10.5)
nRBC: 0 % (ref 0.0–0.2)

## 2022-09-27 LAB — TROPONIN I (HIGH SENSITIVITY)
Troponin I (High Sensitivity): 12 ng/L (ref <18)
Troponin I (High Sensitivity): 11 ng/L (ref <18)

## 2022-09-27 LAB — BRAIN NATRIURETIC PEPTIDE: B Natriuretic Peptide: 241.2 pg/mL — ABNORMAL HIGH (ref 0.0–100.0)

## 2022-09-27 LAB — MAGNESIUM: Magnesium: 1.8 mg/dL (ref 1.7–2.4)

## 2022-09-27 LAB — FOLATE: Folate: 8.2 ng/mL (ref >5.9)

## 2022-09-27 MED ORDER — IOHEXOL 350 MG/ML SOLN
60.0000 mL | Freq: Once | INTRAVENOUS | Status: AC | PRN
  Administered 2022-09-27: 60 mL via INTRAVENOUS

## 2022-09-27 MED ORDER — ONDANSETRON HCL 4 MG/2ML IJ SOLN: 4.0000 mg | Freq: Four times a day (QID) | INTRAMUSCULAR | Status: DC | PRN

## 2022-09-27 MED ORDER — ALPRAZOLAM 0.5 MG PO TABS
0.5000 mg | ORAL_TABLET | Freq: Every day | ORAL | Status: DC | PRN
  Administered 2022-09-28 – 2022-09-29 (×2): 0.5 mg via ORAL
  Filled 2022-09-27 (×2): qty 1

## 2022-09-27 MED ORDER — PANTOPRAZOLE SODIUM 40 MG PO TBEC
40.0000 mg | DELAYED_RELEASE_TABLET | Freq: Two times a day (BID) | ORAL | Status: DC
  Administered 2022-09-28 – 2022-10-03 (×9): 40 mg via ORAL
  Filled 2022-09-27 (×10): qty 1

## 2022-09-27 MED ORDER — LEFLUNOMIDE 10 MG PO TABS
10.0000 mg | ORAL_TABLET | Freq: Every day | ORAL | Status: DC
  Administered 2022-09-28 – 2022-10-03 (×6): 10 mg via ORAL
  Filled 2022-09-27 (×6): qty 1

## 2022-09-27 MED ORDER — LEVALBUTEROL HCL 0.63 MG/3ML IN NEBU: 0.6300 mg | INHALATION_SOLUTION | Freq: Three times a day (TID) | RESPIRATORY_TRACT | Status: DC | PRN

## 2022-09-27 MED ORDER — HYDROXYCHLOROQUINE SULFATE 200 MG PO TABS
200.0000 mg | ORAL_TABLET | Freq: Every day | ORAL | Status: DC
  Administered 2022-09-28 – 2022-10-03 (×6): 200 mg via ORAL
  Filled 2022-09-27 (×6): qty 1

## 2022-09-27 MED ORDER — TRIAMCINOLONE ACETONIDE 55 MCG/ACT NA AERO: 2.0000 | INHALATION_SPRAY | Freq: Every day | NASAL | Status: DC | PRN

## 2022-09-27 MED ORDER — APIXABAN 5 MG PO TABS
5.0000 mg | ORAL_TABLET | Freq: Two times a day (BID) | ORAL | Status: DC
  Administered 2022-09-28: 5 mg via ORAL
  Filled 2022-09-27: qty 1

## 2022-09-27 MED ORDER — CITALOPRAM HYDROBROMIDE 20 MG PO TABS: 20.0000 mg | ORAL_TABLET | Freq: Every evening | ORAL | Status: DC

## 2022-09-27 MED ORDER — MONTELUKAST SODIUM 10 MG PO TABS
10.0000 mg | ORAL_TABLET | Freq: Every day | ORAL | Status: DC | PRN
  Administered 2022-09-28 – 2022-09-29 (×2): 10 mg via ORAL
  Filled 2022-09-27 (×2): qty 1

## 2022-09-27 MED ORDER — PREDNISONE 10 MG PO TABS: 10.0000 mg | ORAL_TABLET | Freq: Every day | ORAL | Status: DC

## 2022-09-27 MED ORDER — ASPIRIN 325 MG PO TABS
325.0000 mg | ORAL_TABLET | Freq: Once | ORAL | Status: AC
  Administered 2022-09-27: 325 mg via ORAL
  Filled 2022-09-27: qty 1

## 2022-09-27 NOTE — H&P (Incomplete)
History and Physical    Patient: Courtney Wilcox:[id number] DOB: Mar 01, 1945 DOA: 09/27/2022 DOS: the patient was seen and examined on 09/28/2022 PCP: Patrice Paradise, MD  Patient coming from: Home   Chief Complaint:  Chief Complaint  Patient presents with   Chest Pain   HPI: Courtney Wilcox is a 77 y.o. female with medical history significant for anemia, asthma, B12 deficiency, depression, GERD, hypertension, OSA, rheumatoid arthritis, thyroid nodule, PE,  SVT history presenting with Chest pain/ UA symptom and was seen by dr.end and EKG was unclear and referred by cards for chest pain. Runs of SVT. CTA in ed is negative for PE and shows multiple thyroid nodules. Pt was seen by dr End who changed metoprolol to diltiazem as she was having sob due to her BB. C/P today is 5/10 and pt received ceftin and prednisone for her sinus infection. During my exam and eval at bedside in Ed pt is tachycardic every time she speaks. Suspect she may be becoming hypoxic irritating the cardiac muscle and also causing dysrhythmia.   Review of Systems: Review of Systems  Respiratory:  Positive for shortness of breath.   Cardiovascular:  Positive for chest pain, palpitations and leg swelling.       Svt/ Sinus Tach  All other systems reviewed and are negative.  Past Medical History:  Diagnosis Date   Allergic rhinitis    Anemia    Anxiety    Arthritis    Asthma    Atrophic vaginitis    B12 deficiency    Bronchitis, chronic (HCC)    Cervical disc disease    Collagen vascular disease (HCC)    RA   Depression    Fibrocystic breast disease    Foot ulcer (HCC)    GERD (gastroesophageal reflux disease)    Hammer toe    Hyperlipidemia    IBS (irritable bowel syndrome)    Macular degeneration 2023   Obesity    Obstructive sleep apnea    OSA (obstructive sleep apnea)    Osteoarthrosis    Multi site   Osteopenia    Rheumatoid arthritis (HCC)    Shingles    Thyroid nodule    MULTIPLE    Tonsillitis    recurrent   Umbilical hernia    Past Surgical History:  Procedure Laterality Date   BACK SURGERY  2011   LAMINECTOMY   BREAST BIOPSY Right    neg   CERVICAL FUSION  2011, 2012   X 2   COLONOSCOPY  2005, 2015   COLONOSCOPY WITH PROPOFOL N/A 08/04/2020   Procedure: COLONOSCOPY WITH PROPOFOL;  Surgeon: Earline Mayotte, MD;  Location: [facility] ENDOSCOPY;  Service: Endoscopy;  Laterality: N/A;   ESOPHAGOGASTRODUODENOSCOPY (EGD) WITH PROPOFOL N/A 09/26/2021   Procedure: ESOPHAGOGASTRODUODENOSCOPY (EGD) WITH PROPOFOL;  Surgeon: Jaynie Collins, DO;  Location: [facility] ENDOSCOPY;  Service: Gastroenterology;  Laterality: N/A;   ESOPHAGOGASTRODUODENOSCOPY ENDOSCOPY  2005, 2013, 2015   EYE SURGERY Bilateral    Cataract Extraction with IOL    HERNIA REPAIR     JOINT REPLACEMENT     KNEE ARTHROPLASTY Left 07/12/2016   Procedure: COMPUTER ASSISTED TOTAL KNEE ARTHROPLASTY;  Surgeon: Donato Heinz, MD;  Location: [facility] ORS;  Service: Orthopedics;  Laterality: Left;   Laryngeal tear after intubation w/repair     LUMBAR DISC SURGERY  12/2009   REPLACEMENT TOTAL KNEE Right 06/2007   DR. HOOTEN, [facility]   REPLACEMENT UNICONDYLAR JOINT KNEE     DR. [place], [facility]  SHOULDER ARTHROSCOPY WITH ROTATOR CUFF REPAIR Right    TONSILLECTOMY     TOTAL HIP ARTHROPLASTY Right 01/31/2017   Procedure: TOTAL HIP ARTHROPLASTY;  Surgeon: Donato Heinz, MD;  Location: [facility] ORS;  Service: Orthopedics;  Laterality: Right;   TUBAL LIGATION     UPPER GI ENDOSCOPY     Social History:   reports that she has never smoked. She has never used smokeless tobacco. She reports that she does not drink alcohol and does not use drugs.  Allergies  Allergen Reactions   Lactose Other (See Comments) and Diarrhea   Other Other (See Comments)    Pt ONLY tolerates SLOW-FE (slow released iron)---upsets IBS   Biaxin [Clarithromycin] Other (See Comments)    GI upset    Hydrocodone-Acetaminophen Itching    Tolerates  acetaminophen    Oxycodone Itching   Sulfa Antibiotics Other (See Comments) and Diarrhea    GI upset  GI upset GI upset    Sulfasalazine Diarrhea    Family History  Problem Relation Age of Onset   Asthma Mother    Breast cancer Mother 108   Emphysema Father        smoked   Lung cancer Father        smoked   Coronary artery disease Father 98    Prior to Admission medications   Medication Sig Start Date End Date Taking? Authorizing Provider  acetaminophen (TYLENOL) 500 MG tablet Take 1,000 mg by mouth 3 (three) times daily as needed for mild pain.     [provider]  ALPRAZolam Prudy Feeler) 0.5 MG tablet Take by mouth daily as needed. 07/07/20   [provider]  apixaban (ELIQUIS) 5 MG TABS tablet Take 1 tablet (5 mg total) by mouth 2 (two) times daily. 04/28/22   End, Cristal Deer, MD  Cholecalciferol (VITAMIN D) 2000 units tablet Take 4,000 Units by mouth daily.     [provider]  citalopram (CELEXA) 20 MG tablet Take 20 mg by mouth every evening.  06/15/16   [provider]  Cyanocobalamin 1000 MCG/ML KIT Inject as directed every 30 (thirty) days.    [provider]  diltiazem (CARDIZEM CD) 180 MG 24 hr capsule Take 1 capsule (180 mg total) by mouth daily. 07/07/22 10/05/22  Sherie Don, NP  Dupilumab 300 MG/2ML SOPN Inject 4 mLs into the skin every 14 (fourteen) days. 12/15/21   [provider]  EPINEPHrine 0.3 mg/0.3 mL IJ SOAJ injection Inject 0.3 mg into the muscle as needed. 11/29/21   [provider]  estradiol (ESTRACE) 0.1 MG/GM vaginal cream Place vaginally. 09/07/22   [provider]  famotidine (PEPCID) 20 MG tablet Take by mouth daily as needed.    [provider]  hydroxychloroquine (PLAQUENIL) 200 MG tablet Take 1 tablet (200 mg total) by mouth daily. 07/21/16   Tera Partridge, PA  Iron-Vitamin C 65-125 MG TABS Take 1 tablet by mouth daily. 05/15/22   [name], MD  leflunomide (ARAVA) 20 MG tablet  Take 10 mg by mouth daily. 06/30/20   [provider]  levalbuterol Pauline Aus) 0.63 MG/3ML nebulizer solution as needed for wheezing. 04/25/21   [provider]  meclizine (ANTIVERT) 12.5 MG tablet PLEASE SEE ATTACHED FOR DETAILED DIRECTIONS 10/05/18   [provider]  montelukast (SINGULAIR) 10 MG tablet Take 10 mg by mouth daily as needed (for allergies.).     [provider]  nystatin (MYCOSTATIN) 100000 UNIT/ML suspension Take 5 mLs by mouth as needed. 06/21/21  [provider]  pantoprazole (PROTONIX) 40 MG tablet Take 40 mg by mouth 2 (two) times daily. 04/05/21   [provider]  potassium chloride SA (KLOR-CON M) 20 MEQ tablet Take 2 tablets (40 mEq total) by mouth every other day. With your Lasix. 07/15/21   Lanier Prude, MD  predniSONE (DELTASONE) 10 MG tablet Take 10 mg by mouth daily. 11/18/20   [provider]  Simethicone 180 MG CAPS Take 1-2 capsules by mouth as needed.    [provider]  triamcinolone (NASACORT) 55 MCG/ACT AERO nasal inhaler Place 2 sprays into the nose daily.    [provider]  ZENPEP 25000-79000 units CPEP Take by mouth. Occasionally takes with a large meal 01/20/22   [provider]    Vitals:   09/28/22 0030 09/28/22 0100 09/28/22 0105 09/28/22 0127  BP: 133/82 131/65    Pulse: (!) 43 70 (!) 120 73  Resp: 19 20 (!) 23 (!) 24  Temp:      TempSrc:      SpO2: 98% 97% 97% 97%  Weight:      Height:       Physical Exam Vitals and nursing note reviewed.  Constitutional:      General: She is not in acute distress.    Appearance: She is obese.     Interventions: Nasal cannula in place.     Comments: 2 L [place]  HENT:     Head: Normocephalic and atraumatic.     Right Ear: Hearing normal.     Left Ear: Hearing normal.     Nose: Nose normal. No nasal deformity.     Mouth/Throat:     Lips: Pink.     Tongue: No lesions.     Pharynx: Oropharynx is clear.  Eyes:      General: Lids are normal.     Extraocular Movements: Extraocular movements intact.  Cardiovascular:     Rate and Rhythm: Normal rate and regular rhythm.     Pulses:          Radial pulses are 2+ on the right side and 2+ on the left side.       Dorsalis pedis pulses are 2+ on the right side and 2+ on the left side.     Heart sounds: Normal heart sounds.  Pulmonary:     Effort: Pulmonary effort is normal.     Breath sounds: Normal breath sounds.  Abdominal:     General: Bowel sounds are normal. There is no distension.     Palpations: Abdomen is soft. There is no mass.     Tenderness: There is no abdominal tenderness.  Musculoskeletal:     Right lower leg: No edema.     Left lower leg: No edema.  Skin:    General: Skin is warm.  Neurological:     General: No focal deficit present.     Mental Status: She is alert and oriented to person, place, and time.     Cranial Nerves: Cranial nerves 2-12 are intact.  Psychiatric:        Attention and Perception: Attention normal.        Mood and Affect: Mood normal.        Speech: Speech normal.        Behavior: Behavior normal. Behavior is cooperative.    Labs on Admission: I have personally reviewed following labs and imaging studies  CBC: Recent Labs  Lab 09/27/22 1806  WBC 9.5  HGB 11.6*  HCT 36.6  MCV 103.7*  PLT 198   Basic Metabolic Panel: Recent Labs  Lab 09/27/22 1806  NA 140  K 3.7  CL 107  CO2 22  GLUCOSE 99  BUN 28*  CREATININE 1.50*  CALCIUM 8.7*  MG 1.8   GFR: Estimated Creatinine Clearance: 32.9 mL/min (A) (by C-G formula based on SCr of 1.5 mg/dL (H)). Liver Function Tests: Recent Labs  Lab 09/27/22 1806  AST 17  ALT 15  ALKPHOS 66  BILITOT 0.6  PROT 6.0*  ALBUMIN 3.4*   No results for input(s): "LIPASE", "AMYLASE" in the last 168 hours. No results for input(s): "AMMONIA" in the last 168 hours. Coagulation Profile: No results for input(s): "INR", "PROTIME" in the last 168 hours. Cardiac  Enzymes: No results for input(s): "CKTOTAL", "CKMB", "CKMBINDEX", "TROPONINI" in the last 168 hours. BNP (last 3 results) No results for input(s): "PROBNP" in the last 8760 hours. HbA1C: No results for input(s): "HGBA1C" in the last 72 hours. CBG: No results for input(s): "GLUCAP" in the last 168 hours. Lipid Profile: No results for input(s): "CHOL", "HDL", "LDLCALC", "TRIG", "CHOLHDL", "LDLDIRECT" in the last 72 hours. Thyroid Function Tests: No results for input(s): "TSH", "T4TOTAL", "FREET4", "T3FREE", "THYROIDAB" in the last 72 hours. Anemia Panel: Recent Labs    09/27/22 2226  FOLATE 8.2   Urinalysis    Component Value Date/Time   COLORURINE YELLOW 01/25/2017 1219   APPEARANCEUR CLEAR 01/25/2017 1219   LABSPEC 1.015 01/25/2017 1219   PHURINE 5.5 01/25/2017 1219   GLUCOSEU NEGATIVE 01/25/2017 1219   HGBUR NEGATIVE 01/25/2017 1219   BILIRUBINUR NEGATIVE 01/25/2017 1219   KETONESUR NEGATIVE 01/25/2017 1219   PROTEINUR NEGATIVE 01/25/2017 1219   NITRITE NEGATIVE 01/25/2017 1219   LEUKOCYTESUR SMALL (A) 01/25/2017 1219   Unresulted Labs (From admission, onward)     Start     Ordered   09/28/22 0200  Type and screen  Once,   STAT        09/28/22 0200   09/27/22 2138  Vitamin B12  Add-on,   AD        09/27/22 2138           Medications  apixaban (ELIQUIS) tablet 5 mg (5 mg Oral Given 09/28/22 0100)  ALPRAZolam (XANAX) tablet 0.5 mg (has no administration in time range)  hydroxychloroquine (PLAQUENIL) tablet 200 mg (has no administration in time range)  leflunomide (ARAVA) tablet 10 mg (has no administration in time range)  levalbuterol (XOPENEX) nebulizer solution 0.63 mg (has no administration in time range)  montelukast (SINGULAIR) tablet 10 mg (10 mg Oral Given 09/28/22 0101)  pantoprazole (PROTONIX) EC tablet 40 mg (has no administration in time range)  triamcinolone (NASACORT) nasal inhaler 2 spray (has no administration in time range)  ondansetron (ZOFRAN)  injection 4 mg (has no administration in time range)  metoprolol tartrate (LOPRESSOR) injection 1.3 mg (1.3 mg Intravenous Given 09/28/22 0101)  citalopram (CELEXA) tablet 30 mg (0 mg Oral Hold 09/28/22 0115)  predniSONE (DELTASONE) tablet 5 mg (has no administration in time range)    Followed by  predniSONE (DELTASONE) tablet 5 mg (has no administration in time range)    Followed by  predniSONE (DELTASONE) tablet 5 mg (has no administration in time range)    Followed by  predniSONE (DELTASONE) tablet 2.5 mg (has no administration in time range)  aspirin tablet 325 mg (325 mg Oral Given 09/27/22 1920)  iohexol (OMNIPAQUE) 350 MG/ML injection 60 mL (60 mLs Intravenous Contrast Given 09/27/22 1936)  Radiological Exams on Admission: CT Angio Chest PE W and/or Wo Contrast  Result Date: 09/27/2022 CLINICAL DATA:  Shortness of breath, weakness and chest pain. EXAM: CT ANGIOGRAPHY CHEST WITH CONTRAST TECHNIQUE: Multidetector CT imaging of the chest was performed using the standard protocol during bolus administration of intravenous contrast. Multiplanar CT image reconstructions and MIPs were obtained to evaluate the vascular anatomy. RADIATION DOSE REDUCTION: This exam was performed according to the departmental dose-optimization program which includes automated exposure control, adjustment of the mA and/or kV according to patient size and/or use of iterative reconstruction technique. CONTRAST:  60mL OMNIPAQUE IOHEXOL 350 MG/ML SOLN COMPARISON:  November 19, 2020 FINDINGS: Cardiovascular: There is mild to moderate severity calcification of the aortic arch and descending thoracic aorta, without evidence of aortic aneurysm. Satisfactory opacification of the pulmonary arteries to the segmental level. No evidence of pulmonary embolism. Normal heart size with mild coronary artery calcification. No pericardial effusion. Mediastinum/Nodes: No enlarged mediastinal, hilar, or axillary lymph nodes. Multiple  heterogeneous thyroid nodules are seen within an enlarged left lobe of the thyroid gland. The trachea and esophagus demonstrate no significant findings. Lungs/Pleura: There is no evidence of an acute infiltrate, pleural effusion or pneumothorax. Upper Abdomen: There is a small hiatal hernia. Musculoskeletal: Multilevel degenerative changes seen throughout the thoracic spine. Review of the MIP images confirms the above findings. IMPRESSION: 1. No evidence of pulmonary embolism or acute cardiopulmonary disease. 2. Multiple heterogeneous thyroid nodules within an enlarged left lobe of the thyroid gland. Further evaluation with nonemergent thyroid ultrasound is recommended. This follows ACR consensus guidelines: Managing Incidental Thyroid Nodules Detected on Imaging: White Paper of the ACR Incidental Thyroid Findings Committee. J Am Coll Radiol 2015; 12:143-150. 3. Small hiatal hernia. 4. Aortic atherosclerosis. Aortic Atherosclerosis (ICD10-I70.0). Electronically Signed   By: Aram Candela M.D.   On: 09/27/2022 20:36   DG Chest Port 1 View  Result Date: 09/27/2022 CLINICAL DATA:  Intermittent chest pain and palpitations for several months EXAM: PORTABLE CHEST 1 VIEW COMPARISON:  Chest radiograph 06/14/2017 FINDINGS: The heart is enlarged, unchanged. The upper mediastinal contours are normal There is no focal consolidation or pulmonary edema. There is no pleural effusion or pneumothorax There is no acute osseous abnormality. IMPRESSION: Cardiomegaly. No radiographic evidence of acute cardiopulmonary process. Electronically Signed   By: Lesia Hausen M.D.   On: 09/27/2022 19:37    Data Reviewed: Relevant notes from primary care and specialist visits, past discharge summaries as available in EHR, including Care Everywhere. Prior diagnostic testing as pertinent to current admission diagnoses Updated medications and problem lists for reconciliation ED course, including vitals, labs, imaging, treatment and  response to treatment Triage notes, nursing and pharmacy notes and ED provider's notes Notable results as noted in HPI  Assessment and Plan: * Chest pain Pt has RF for cardiac chest pain. PT is intermittently becoming tachycardic at bedside with HR at high as 140-150 and starts coming down as soon as she relaxed and took deep breaths. EKG shows HR of 94 and SR with frequent PAC. CTA chest is negative and pt is on [facility] for VTE. Pt in past has not wanted Cardiac cath per cards note.  We will admit pt and cont her on 2 L [place] she clinically feels better with oxygen.  We will obtain cardiology consult. 2 d echocardiogram  and pt may need PFT and or HRCT chest if workup negative so far.    Anemia of chronic disease Pt has h/o anemia and hb today is 11.6.  MCV is elevated over 103,.  We will type/screen. Iv ppi.  Resume cyanocobalamin / iron vit c supplement.  History of pulmonary embolism We will continue pt on eliquis.     Anemia in CKD (chronic kidney disease) Pt has anemia with current hb of  11.6 and macrocytosis  since about  2018. We will cont cyanocobalamin and iron- vit c .  B12 folate level.  Free t4 and tsh.    History of atrial flutter Currently in sinus rhythm . Pt was in SVT in the beginning , on exam in ed pt is intermittently tachycardic.  Continue eliquis.    Multiple thyroid nodules Thyroid USG to evaluate as thyroid had multiple heterogenous thyroid nodule.  Cont metoprolol.    DVT prophylaxis:  Eliquis.   Consults:  Cardiology consult; [facility].  Advance Care Planning:    Code Status: Full Code   Family Communication:  Daughters.   Disposition Plan:  Home   Severity of Illness: The appropriate patient status for this patient is OBSERVATION. Observation status is judged to be reasonable and necessary in order to provide the required intensity of service to ensure the patient's safety. The patient's presenting symptoms, physical exam findings, and initial  radiographic and laboratory data in the context of their medical condition is felt to place them at decreased risk for further clinical deterioration. Furthermore, it is anticipated that the patient will be medically stable for discharge from the hospital within 2 midnights of admission.   Author: Gertha Calkin, MD 09/28/2022 2:40 AM  For on call review [email or web address].

## 2022-09-27 NOTE — Patient Instructions (Signed)
Your physician recommends that you continue on your current medications as directed. Please refer to the Current Medication list given to you today.    Lab Work: No labs ordered today    Testing/Procedures: No test ordered today    Follow-Up: At [facility], you and your health needs are our priority.  As part of our continuing mission to provide you with exceptional heart care, we have created designated Provider Care Teams.  These Care Teams include your primary Cardiologist (physician) and Advanced Practice Providers (APPs -  Physician Assistants and Nurse Practitioners) who all work together to provide you with the care you need, when you need it.  We recommend signing up for the patient portal called "MyChart".  Sign up information is provided on this After Visit Summary.  MyChart is used to connect with patients for Virtual Visits (Telemedicine).  Patients are able to view lab/test results, encounter notes, upcoming appointments, etc.  Non-urgent messages can be sent to your provider as well.   To learn more about what you can do with MyChart, go to [email or web address].    Your next appointment:   Based on Emergency room follow up   Provider:   You may see Yvonne Kendall, MD or one of the following Advanced Practice Providers on your designated Care Team:   Nicolasa Ducking, NP Eula Listen, PA-C Cadence Fransico Michael, PA-C Charlsie Quest, NP

## 2022-09-27 NOTE — ED Notes (Addendum)
Pt states she has middle chest, and she is dyspneic when she speaks or walks. She has Hx of clots in her lungs.Hx of Asthma.

## 2022-09-27 NOTE — ED Provider Notes (Signed)
[facility] Provider Note    Event Date/Time   First MD Initiated Contact with Patient 09/27/22 1746     (approximate)   History   Chest Pain   HPI Courtney Wilcox is a 77 y.o. female with asthma, anemia of chronic disease, rheumatoid arthritis, PE on Eliquis presenting today for chest pain.  Patient notes over the past 7 to 10 days she has had worsening shortness of breath and weakness which is even present at rest and is new for her.  She has tried her albuterol nebulizer without significant relief in symptoms.  Today she started having new chest pain this morning that felt like tightness.  Seen by her cardiologist in the clinic where they noted her heart rate to be irregular with concerns for SVT versus V-fib.  She was sent to the emergency department for further evaluation.  At this time, she still notes ongoing shortness of breath and weakness.  The chest tightness has improved and is minimal at this time.  Otherwise denying nausea, vomiting, diaphoresis, fever, chills, cough.  No new leg swelling or leg pain.  Denies any missed doses of Eliquis.     Physical Exam   Triage Vital Signs: ED Triage Vitals [09/27/22 1742]  Encounter Vitals Group     BP (!) 149/108     Systolic BP Percentile      Diastolic BP Percentile      Pulse Rate 89     Resp (!) 24     Temp 97.7 F (36.5 C)     Temp Source Oral     SpO2 100 %     Weight 189 lb (85.7 kg)     Height 5\' 2"  (1.575 m)     Head Circumference      Peak Flow      Pain Score 5     Pain Loc      Pain Education      Exclude from Growth Chart     Most recent vital signs: Vitals:   09/27/22 1920 09/27/22 2000  BP: (!) 175/123 (!) 155/83  Pulse: 66 90  Resp: (!) 25 (!) 21  Temp:    SpO2: 99% 96%   Physical Exam: I have reviewed the vital signs and nursing notes. General: Awake, alert, no acute distress.  Nontoxic appearing. Head:  Atraumatic, normocephalic.   ENT:  EOM intact, PERRL. Oral  mucosa is pink and moist with no lesions. Neck: Neck is supple with full range of motion, No meningeal signs. Cardiovascular:  RRR, No murmurs. Peripheral pulses palpable and equal bilaterally. Respiratory:  Symmetrical chest wall expansion.  No rhonchi, rales, or wheezes.  Good air movement throughout.  No use of accessory muscles.   Musculoskeletal:  No cyanosis or edema. Moving extremities with full ROM Abdomen:  Soft, nontender, nondistended. Neuro:  GCS 15, moving all four extremities, interacting appropriately. Speech clear. Psych:  Calm, appropriate.   Skin:  Warm, dry, no rash.    ED Results / Procedures / Treatments   Labs (all labs ordered are listed, but only abnormal results are displayed) Labs Reviewed  CBC - Abnormal; Notable for the following components:      Result Value   RBC 3.53 (*)    Hemoglobin 11.6 (*)    MCV 103.7 (*)    All other components within normal limits  COMPREHENSIVE METABOLIC PANEL - Abnormal; Notable for the following components:   BUN 28 (*)    Creatinine, Ser 1.50 (*)  Calcium 8.7 (*)    Total Protein 6.0 (*)    Albumin 3.4 (*)    GFR, Estimated 36 (*)    All other components within normal limits  BRAIN NATRIURETIC PEPTIDE - Abnormal; Notable for the following components:   B Natriuretic Peptide 241.2 (*)    All other components within normal limits  MAGNESIUM  VITAMIN B12  FOLATE  TROPONIN I (HIGH SENSITIVITY)  TROPONIN I (HIGH SENSITIVITY)     EKG See ED course for my EKG interpretation   RADIOLOGY The ED course for my chest x-ray interpretation   PROCEDURES:  Critical Care performed: No  Procedures   MEDICATIONS ORDERED IN ED: Medications  aspirin tablet 325 mg (325 mg Oral Given 09/27/22 1920)  iohexol (OMNIPAQUE) 350 MG/ML injection 60 mL (60 mLs Intravenous Contrast Given 09/27/22 1936)     IMPRESSION / MDM / ASSESSMENT AND PLAN / ED COURSE  I reviewed the triage vital signs and the nursing notes.                               Differential diagnosis includes, but is not limited to, ACS, unstable angina, asthma exacerbation, pneumonia, heart failure exacerbation, cardiac arrhythmia.    Patient's presentation is most consistent with acute presentation with potential threat to life or bodily function.  Patient is a 77 year old female presenting today from cardiology clinic over worsening unstable angina and shortness of breath in the setting of possible cardiac arrhythmia.  Noted frequent PACs on rhythm strip here but no obvious SVT or other concerning abnormality at this time.  CBC, CMP, and BNP relatively unremarkable.  Troponin stable at 12 and 11 with no active ACS concerns at this time.  Given prior history of PE CT a chest was ordered which shows no acute PE.  Discussed case with Dr. Graciela Husbands who does agree with Dr. Serita Kyle assessment earlier today and recommends admission for ongoing cardiac monitoring and potential heart cath on Friday.  Patient was admitted to hospitalist service for further care.  The patient is on the cardiac monitor to evaluate for evidence of arrhythmia and/or significant heart rate changes. Clinical Course as of 09/27/22 2206  Wed Sep 27, 2022  1824 CBC(!) Unremarkable and otherwise comparable with baseline [DW]  1853 Magnesium: 1.8 [DW]  1853 Troponin I (High Sensitivity): 12 [DW]  1853 Comprehensive metabolic panel(!) Creatinine comparable with similar baseline [DW]  1903 B Natriuretic Peptide(!): 241.2 Mildly elevated but comparable levels in the past [DW]  1910 ECG performed at cardiology clinic showing rate of 94, sinus rhythm with frequent PACs as well as concern for brief SVT.  No acute ST elevations or depressions [DW]  2039 CT Angio Chest PE W and/or Wo Contrast No acute PE or other pulmonary pathology [DW]  2115 Spoke with Dr. Graciela Husbands who agrees with Dr. Serita Kyle assessment for admission for ongoing cardiac monitoring and potential heart cath on Friday. [DW]    Clinical  Course User Index [DW] Janith Lima, MD     FINAL CLINICAL IMPRESSION(S) / ED DIAGNOSES   Final diagnoses:  Unstable angina (HCC)  Shortness of breath  Coronary artery disease due to calcified coronary lesion     Rx / DC Orders   ED Discharge Orders     None        Note:  This document was prepared using Dragon voice recognition software and may include unintentional dictation errors.   Janith Lima,  MD 09/27/22 2208

## 2022-09-27 NOTE — ED Notes (Signed)
Assumed care of pt at this time. Pt is [name], on CCM, pt HR noted to be irregular and at time increasing into 130s-140's BPMS for several seconds before going back down to 60-70's BPM. Prior nurse Efrain Sella stated hospitialist witnessed pt going into this tachycardic rhythm during eval and pt states she has a known hx of this cardiac activity. No needs identified at this time. Pt accompanied by family member

## 2022-09-27 NOTE — ED Triage Notes (Signed)
Pt reports intermittent CP and palpitations for several months. Reports that today she was at cardiologist and had EKG which showed periods of SVT versus VT. Pt denies pain currently. Visibly dyspneic when speaking during triage.

## 2022-09-27 NOTE — Progress Notes (Signed)
Cardiology Office Note:  .   Date:  09/27/2022  ID:  Courtney Wilcox, DOB 08/01/1945, MRN [id number] PCP: Patrice Paradise, MD  [facility] Providers Cardiologist:  Yvonne Kendall, MD Electrophysiologist:  Lanier Prude, MD     History of Present Illness: .   Courtney Wilcox is a 77 y.o. female with history of supraventricular tachycardia, pulmonary embolism (11/2020), rheumatoid arthritis, and obstructive sleep apnea, who presents for follow-up of shortness of breath and supraventricular tachycardia.  I last saw her in June, at which time she complained of more shortness of breath than at prior visits.  She also endorsed some wheezing.  I encouraged her to begin taking the diltiazem that was ordered at her prior EP visit with Courtney Means, NP.  I encouraged her to stop using metoprolol in case this was worsening her underlying lung disease.  Diltiazem was increased due to jitteriness after our visit by phone.  She continued to complain of palpitations and shortness of breath.  Today, Courtney Wilcox reports that she is not feeling well.  She has been feeling more short of breath for the last few days and also describes tightness in the center of her chest radiating towards the left shoulder.  She has had somewhat similar pain in the past, though it is notably worse today.  During my visit with her, it is 5/10 in intensity, though with minimal activity earlier today, it was 7/10.  She has been using her nebulizer treatments more frequently but has not been getting much relief.  She is currently on prednisone as well as Ceftin, having recently been diagnosed with a sinus infection.  She does not feel like her symptoms are getting better.  Over the last month, Courtney Wilcox has also been having increasing orthopnea and now has a hard time lying flat.  She typically sits in the recliner to rest.  She has not had any edema.  She notices some palpitations when her dyspnea worsens.  She  checks her heart rates regularly and notes that they have been irregular.  She is currently taking diltiazem 180 mg daily and does not feel like increasing the dose or changing from metoprolol has made much of a difference.  She notes profound fatigue and sometimes feels like she is unable to do anything because she gets tired so easily.  ROS: See HPI  Studies Reviewed: Marland Kitchen   ECG (09/27/22): Normal sinus rhythm with frequent PACs and brief supraventricular runs, isolated PVCs, and poor R wave progression.  Compared to prior tracing from 07/14/2022, heart rate has increased.  14-day event monitor (06/02/22): Sinus rhythm with numerous episodes of SVT lasting up to 40 minutes, as detailed above. Rare PACs and PVCs as well as a single brief run of NSVT also noted.   Risk Assessment/Calculations:             Physical Exam:   VS:  BP 118/74 (BP Location: Left Arm, Patient Position: Sitting, Cuff Size: Normal)   Pulse (!) 109   Ht 5\' 2"  (1.575 m)   Wt 189 lb (85.7 kg)   SpO2 95%   BMI 34.57 kg/m    Wt Readings from Last 3 Encounters:  09/27/22 189 lb (85.7 kg)  09/27/22 189 lb (85.7 kg)  09/11/22 189 lb (85.7 kg)    General: Anxious appearing woman seated on exam table.  She is accompanied by her daughter. Neck: Difficult assess JVP, though it appears to be elevated near the angle  of the mandible. Lungs: Coarse breath sounds bilaterally, mildly diminished throughout. Heart: Tachycardic and irregular rhythm without murmurs. Abdomen: Soft, nontender, nondistended. Extremities: No lower extremity edema.  Varicose veins noted in the left lower extremity.  ASSESSMENT AND PLAN: .    Unstable angina: Courtney Wilcox reports recent escalation of chest pain and shortness of breath, with chest tightness radiating to the left arm present at rest during her visit today.  While some of this could be related to underlying pulmonary disease as well as her atrial arrhythmia, I worry that she may also have  underlying CAD.  She has previously been reluctant to move forward with cardiac catheterization.  In the setting of ongoing chest pain, I have recommended that she be evaluated in the ED.  She will likely need admission for monitoring and potential cardiac catheterization on Friday after washout of apixaban.  Supraventricular tachycardia: Frequent supraventricular ectopy noted on ECG today despite escalation of diltiazem.  If blood pressure allows, further escalation of diltiazem should be considered.  Given brittle asthma, she is not a good candidate for aggressive beta-blockade or long-term amiodarone therapy.  Further consultation with electrophysiology, who has evaluated her in the past, would be helpful.  Asthma and history of pulmonary embolism: Underlying lung disease is likely playing a role in Mr. Luberta Wilcox symptoms.  She is at risk for pulmonary hypertension, given her prior PE and asthma.  It would be reasonable to repeat an echocardiogram, though ultimately I think Courtney Wilcox will need to undergo right and left heart catheterization.    Dispo: Transport to emergency department for further evaluation and likely admission.  Inpatient [facility] consult team will reevaluate her tomorrow and plan for catheterization on Friday, if appropriate.  Signed, Yvonne Kendall, MD

## 2022-09-28 ENCOUNTER — Telehealth: Payer: Self-pay | Admitting: Oncology

## 2022-09-28 ENCOUNTER — Encounter: Payer: Self-pay | Admitting: Internal Medicine

## 2022-09-28 ENCOUNTER — Inpatient Hospital Stay: Payer: Medicare Other

## 2022-09-28 DIAGNOSIS — D631 Anemia in chronic kidney disease: Secondary | ICD-10-CM

## 2022-09-28 DIAGNOSIS — K219 Gastro-esophageal reflux disease without esophagitis: Secondary | ICD-10-CM | POA: Diagnosis present

## 2022-09-28 DIAGNOSIS — I25110 Atherosclerotic heart disease of native coronary artery with unstable angina pectoris: Principal | ICD-10-CM

## 2022-09-28 DIAGNOSIS — I200 Unstable angina: Secondary | ICD-10-CM | POA: Diagnosis not present

## 2022-09-28 DIAGNOSIS — Z86711 Personal history of pulmonary embolism: Secondary | ICD-10-CM

## 2022-09-28 DIAGNOSIS — I2510 Atherosclerotic heart disease of native coronary artery without angina pectoris: Secondary | ICD-10-CM | POA: Insufficient documentation

## 2022-09-28 DIAGNOSIS — R0602 Shortness of breath: Secondary | ICD-10-CM

## 2022-09-28 DIAGNOSIS — N183 Chronic kidney disease, stage 3 unspecified: Secondary | ICD-10-CM | POA: Diagnosis not present

## 2022-09-28 DIAGNOSIS — E6609 Other obesity due to excess calories: Secondary | ICD-10-CM

## 2022-09-28 DIAGNOSIS — I2584 Coronary atherosclerosis due to calcified coronary lesion: Secondary | ICD-10-CM

## 2022-09-28 DIAGNOSIS — E66811 Obesity, class 1: Secondary | ICD-10-CM

## 2022-09-28 DIAGNOSIS — N1832 Chronic kidney disease, stage 3b: Secondary | ICD-10-CM | POA: Diagnosis present

## 2022-09-28 LAB — CBC WITH DIFFERENTIAL/PLATELET
Abs Immature Granulocytes: 0.05 10*3/uL (ref 0.00–0.07)
Basophils Absolute: 0 10*3/uL (ref 0.0–0.1)
Basophils Relative: 1 %
Eosinophils Absolute: 0.1 10*3/uL (ref 0.0–0.5)
Eosinophils Relative: 1 %
HCT: 33.2 % — ABNORMAL LOW (ref 36.0–46.0)
Hemoglobin: 10.6 g/dL — ABNORMAL LOW (ref 12.0–15.0)
Immature Granulocytes: 1 %
Lymphocytes Relative: 15 %
Lymphs Abs: 1.2 10*3/uL (ref 0.7–4.0)
MCH: 33.1 pg (ref 26.0–34.0)
MCHC: 31.9 g/dL (ref 30.0–36.0)
MCV: 103.8 fL — ABNORMAL HIGH (ref 80.0–100.0)
Monocytes Absolute: 0.7 10*3/uL (ref 0.1–1.0)
Monocytes Relative: 9 %
Neutro Abs: 5.9 10*3/uL (ref 1.7–7.7)
Neutrophils Relative %: 73 %
Platelets: 178 10*3/uL (ref 150–400)
RBC: 3.2 MIL/uL — ABNORMAL LOW (ref 3.87–5.11)
RDW: 14 % (ref 11.5–15.5)
WBC: 8 10*3/uL (ref 4.0–10.5)
nRBC: 0 % (ref 0.0–0.2)

## 2022-09-28 LAB — BASIC METABOLIC PANEL
Anion gap: 10 (ref 5–15)
BUN: 29 mg/dL — ABNORMAL HIGH (ref 8–23)
CO2: 24 mmol/L (ref 22–32)
Calcium: 8.6 mg/dL — ABNORMAL LOW (ref 8.9–10.3)
Chloride: 107 mmol/L (ref 98–111)
Creatinine, Ser: 1.52 mg/dL — ABNORMAL HIGH (ref 0.44–1.00)
GFR, Estimated: 36 mL/min — ABNORMAL LOW (ref >60)
Glucose, Bld: 85 mg/dL (ref 70–99)
Potassium: 4.2 mmol/L (ref 3.5–5.1)
Sodium: 141 mmol/L (ref 135–145)

## 2022-09-28 LAB — VITAMIN B12: Vitamin B-12: 688 pg/mL (ref 180–914)

## 2022-09-28 LAB — TSH: TSH: 3.085 u[IU]/mL (ref 0.350–4.500)

## 2022-09-28 MED ORDER — METOPROLOL TARTRATE 5 MG/5ML IV SOLN: 5.0000 mg | INTRAVENOUS | Status: DC | PRN

## 2022-09-28 MED ORDER — PREDNISONE 10 MG PO TABS
5.0000 mg | ORAL_TABLET | Freq: Two times a day (BID) | ORAL | Status: AC
  Administered 2022-09-29: 5 mg via ORAL
  Filled 2022-09-28: qty 1

## 2022-09-28 MED ORDER — ACETAMINOPHEN 325 MG PO TABS
650.0000 mg | ORAL_TABLET | Freq: Four times a day (QID) | ORAL | Status: DC | PRN
  Administered 2022-09-28 – 2022-09-29 (×2): 650 mg via ORAL
  Filled 2022-09-28: qty 2

## 2022-09-28 MED ORDER — CITALOPRAM HYDROBROMIDE 20 MG PO TABS
30.0000 mg | ORAL_TABLET | Freq: Every evening | ORAL | Status: DC
  Administered 2022-09-28: 30 mg via ORAL
  Filled 2022-09-28: qty 2

## 2022-09-28 MED ORDER — PREDNISONE 10 MG PO TABS
5.0000 mg | ORAL_TABLET | Freq: Every day | ORAL | Status: AC
  Administered 2022-09-30: 5 mg via ORAL
  Filled 2022-09-28: qty 1

## 2022-09-28 MED ORDER — CITALOPRAM HYDROBROMIDE 20 MG PO TABS
20.0000 mg | ORAL_TABLET | Freq: Every evening | ORAL | Status: DC
  Administered 2022-09-28: 20 mg via ORAL
  Filled 2022-09-28: qty 1

## 2022-09-28 MED ORDER — DILTIAZEM HCL ER COATED BEADS 180 MG PO CP24
180.0000 mg | ORAL_CAPSULE | Freq: Every day | ORAL | Status: DC
  Administered 2022-09-28 – 2022-10-03 (×6): 180 mg via ORAL
  Filled 2022-09-28 (×6): qty 1

## 2022-09-28 MED ORDER — PREDNISONE 2.5 MG PO TABS
2.5000 mg | ORAL_TABLET | Freq: Every day | ORAL | Status: DC
  Administered 2022-10-01 – 2022-10-03 (×3): 2.5 mg via ORAL
  Filled 2022-09-28 (×3): qty 1

## 2022-09-28 MED ORDER — METOPROLOL TARTRATE 5 MG/5ML IV SOLN
1.2500 mg | INTRAVENOUS | Status: DC | PRN
  Administered 2022-09-28: 1.3 mg via INTRAVENOUS
  Filled 2022-09-28: qty 5

## 2022-09-28 MED ORDER — PREDNISONE 10 MG PO TABS
5.0000 mg | ORAL_TABLET | Freq: Three times a day (TID) | ORAL | Status: AC
  Administered 2022-09-28 (×3): 5 mg via ORAL
  Filled 2022-09-28 (×3): qty 1

## 2022-09-28 NOTE — Assessment & Plan Note (Signed)
Baseline moderate persistent eosinophilic asthma Continue Dupixent, Singulair

## 2022-09-28 NOTE — Consult Note (Signed)
Cardiology Consultation:   Patient ID: Courtney Wilcox; [id number]; [birth year]   Admit date: 09/27/2022 Date of Consult: 09/28/2022  Primary Care Provider: Patrice Paradise, MD Primary Cardiologist: End Primary Electrophysiologist: Lalla Brothers   Patient Profile:   Courtney Wilcox is a 77 y.o. female with a hx of mild coronary artery calcification noted on CT imaging, SVT, PE in 11/2020 maintained on apixaban since, asthma, rheumatoid arthritis, macrocytic anemia, CKD stage III, thyroid nodules, and OSA who is being seen today for the evaluation of dyspnea and chest tightness with paroxysms of SVT at the request of Dr. Allena Katz.  History of Present Illness:   [name] was previously followed by [facility] cardiology.  She has since establish care with [place] [facility] and has been cardiology and EP for management of increasing palpitation burden with known PSVT.  Echo in 10/2021 showed an EF of 60 to 65%, no regional wall motion abnormalities, mild LVH, grade 2 diastolic dysfunction, normal RV systolic function and ventricular cavity size,, severely dilated left atrium, and mild mitral regurgitation.  With regards to her palpitations, she underwent Zio patch in 04/2022 that showed 25,709 episodes of SVT lasting up to 40 minutes and 12 seconds with a maximum rate of 222 bpm.  Previously on beta-blocker, though this was transitioned to Cardizem with subsequent titration without significant improvement in symptom burden.  Management of SVT has been limited by underlying pulmonary disease and medication interactions with Celexa.  She was seen in the office on 09/27/2018 for noting an increase in dyspnea with associated chest tightness radiating to the left shoulder and progressive orthopnea and now sleeping in a recliner.  She also noted associated palpitations with progressive dyspnea.  She did not feel like the dose titration of diltiazem to 180 mg or transition from beta-blocker had made much  difference in her symptoms.  EKG showed sinus rhythm with frequent PACs and brief supraventricular runs.  She was transferred to the ED for further evaluation and likely admission with recommendation to proceed with [facility].  In the ED, she was hemodynamically stable.  She has ruled out.  She has continued to have frequent paroxysms of SVT on telemetry.  Chest x-ray showed cardiomegaly with no evidence of acute cardiopulmonary process.  CTA chest was negative for PE with aortic atherosclerosis, mild coronary artery calcification, and multiple thyroid nodules noted.  She did receive apixaban in the ER this morning at 1 AM.  At time of cardiology consult, she is maintaining sinus rhythm, though has continued to have frequent paroxysms of SVT.  Given persistent symptoms, she is now agreeable to moving forward with cardiac cath.  She also reports only taking apixaban once daily rather than twice daily at home.    Past Medical History:  Diagnosis Date   Allergic rhinitis    Anemia    Anxiety    Arthritis    Asthma    Atrophic vaginitis    B12 deficiency    Bronchitis, chronic (HCC)    Cervical disc disease    Collagen vascular disease (HCC)    RA   Depression    Fibrocystic breast disease    Foot ulcer (HCC)    GERD (gastroesophageal reflux disease)    Hammer toe    Hyperlipidemia    IBS (irritable bowel syndrome)    Macular degeneration 2023   Obesity    Obstructive sleep apnea    OSA (obstructive sleep apnea)    Osteoarthrosis    Multi site  Osteopenia    Rheumatoid arthritis (HCC)    Shingles    Thyroid nodule    MULTIPLE   Tonsillitis    recurrent   Umbilical hernia     Past Surgical History:  Procedure Laterality Date   BACK SURGERY  2011   LAMINECTOMY   BREAST BIOPSY Right    neg   CERVICAL FUSION  2011, 2012   X 2   COLONOSCOPY  2005, 2015   COLONOSCOPY WITH PROPOFOL N/A 08/04/2020   Procedure: COLONOSCOPY WITH PROPOFOL;  Surgeon: Earline Mayotte, MD;  Location:  [facility] ENDOSCOPY;  Service: Endoscopy;  Laterality: N/A;   ESOPHAGOGASTRODUODENOSCOPY (EGD) WITH PROPOFOL N/A 09/26/2021   Procedure: ESOPHAGOGASTRODUODENOSCOPY (EGD) WITH PROPOFOL;  Surgeon: Jaynie Collins, DO;  Location: [facility] ENDOSCOPY;  Service: Gastroenterology;  Laterality: N/A;   ESOPHAGOGASTRODUODENOSCOPY ENDOSCOPY  2005, 2013, 2015   EYE SURGERY Bilateral    Cataract Extraction with IOL    HERNIA REPAIR     JOINT REPLACEMENT     KNEE ARTHROPLASTY Left 07/12/2016   Procedure: COMPUTER ASSISTED TOTAL KNEE ARTHROPLASTY;  Surgeon: Donato Heinz, MD;  Location: [facility] ORS;  Service: Orthopedics;  Laterality: Left;   Laryngeal tear after intubation w/repair     LUMBAR DISC SURGERY  12/2009   REPLACEMENT TOTAL KNEE Right 06/2007   DR. HOOTEN, [facility]   REPLACEMENT UNICONDYLAR JOINT KNEE     DR. HOOTEN, [facility]   SHOULDER ARTHROSCOPY WITH ROTATOR CUFF REPAIR Right    TONSILLECTOMY     TOTAL HIP ARTHROPLASTY Right 01/31/2017   Procedure: TOTAL HIP ARTHROPLASTY;  Surgeon: Donato Heinz, MD;  Location: [facility] ORS;  Service: Orthopedics;  Laterality: Right;   TUBAL LIGATION     UPPER GI ENDOSCOPY       Home Meds: Prior to Admission medications   Medication Sig Start Date End Date Taking? Authorizing Provider  acetaminophen (TYLENOL) 500 MG tablet Take 1,000 mg by mouth 3 (three) times daily as needed for mild pain.    Yes [provider]  apixaban (ELIQUIS) 5 MG TABS tablet Take 1 tablet (5 mg total) by mouth 2 (two) times daily. 04/28/22  Yes End, Cristal Deer, MD  Cholecalciferol (VITAMIN D) 2000 units tablet Take 2,000 Units by mouth daily.   Yes [provider]  citalopram (CELEXA) 20 MG tablet Take 30 mg by mouth every evening. 06/15/16  Yes [provider]  Cyanocobalamin 1000 MCG/ML KIT Inject as directed every 30 (thirty) days.   Yes [provider]  diltiazem (CARDIZEM CD) 180 MG 24 hr capsule Take 1 capsule (180 mg total) by mouth daily. 07/07/22  10/05/22 Yes Riddle, Luella Cook, NP  Dupilumab 300 MG/2ML SOPN Inject 4 mLs into the skin every 14 (fourteen) days. 12/15/21  Yes [provider]  EPINEPHrine 0.3 mg/0.3 mL IJ SOAJ injection Inject 0.3 mg into the muscle as needed. 11/29/21  Yes [provider]  famotidine (PEPCID) 20 MG tablet Take 20 mg by mouth daily as needed.   Yes [provider]  leflunomide (ARAVA) 20 MG tablet Take 10 mg by mouth daily. 06/30/20  Yes [provider]  levalbuterol Pauline Aus) 0.63 MG/3ML nebulizer solution Take 0.63 mg by nebulization every 6 (six) hours as needed for wheezing. 04/25/21  Yes [provider]  meclizine (ANTIVERT) 12.5 MG tablet PLEASE SEE ATTACHED FOR DETAILED DIRECTIONS 10/05/18  Yes [provider]  montelukast (SINGULAIR) 10 MG tablet Take 10 mg by mouth daily as needed (for allergies.).    Yes [provider]  nystatin (MYCOSTATIN) 100000 UNIT/ML suspension Take 5 mLs by mouth as needed. 06/21/21  Yes [provider]  pantoprazole (PROTONIX) 40 MG tablet Take 40 mg by mouth 2 (two) times daily as needed. 04/05/21  Yes [provider]  predniSONE (STERAPRED UNI-PAK 48 TAB) 5 MG (48) TBPK tablet Take 5 mg by mouth as directed. TAKE STERAPRED TAPER AS DIRECTED FOR 12 DAYS 09/19/22  Yes [provider]  Simethicone 180 MG CAPS Take 1-2 capsules by mouth as needed.   Yes [provider]  ZENPEP 25000-79000 units CPEP Take by mouth. Occasionally takes with a large meal 01/20/22  Yes [provider]  ALPRAZolam Prudy Feeler) 0.5 MG tablet Take by mouth daily as needed. Patient not taking: Reported on 09/28/2022 07/07/20   [provider]  estradiol (ESTRACE) 0.1 MG/GM vaginal cream Place vaginally. Patient not taking: Reported on 09/28/2022 09/07/22   [provider]  hydroxychloroquine (PLAQUENIL) 200 MG tablet Take 1 tablet (200 mg total) by mouth daily. 07/21/16   Tera Partridge, PA  Iron-Vitamin C  65-125 MG TABS Take 1 tablet by mouth daily. Patient not taking: Reported on 09/28/2022 05/15/22   [name], MD  potassium chloride SA (KLOR-CON M) 20 MEQ tablet Take 2 tablets (40 mEq total) by mouth every other day. With your Lasix. Patient not taking: Reported on 09/28/2022 07/15/21   Lanier Prude, MD  predniSONE (DELTASONE) 10 MG tablet Take 10 mg by mouth daily. Patient not taking: Reported on 09/27/2022 11/18/20   [provider]  triamcinolone (NASACORT) 55 MCG/ACT AERO nasal inhaler Place 2 sprays into the nose daily.    [provider]    Inpatient Medications: Scheduled Meds:  citalopram  30 mg Oral QPM   hydroxychloroquine  200 mg Oral Daily   leflunomide  10 mg Oral Daily   pantoprazole  40 mg Oral BID   predniSONE  5 mg Oral TID with meals   Followed by   Melene Muller ON 09/29/2022] predniSONE  5 mg Oral BID WC   Followed by   Melene Muller ON 09/30/2022] predniSONE  5 mg Oral Q breakfast   Followed by   Melene Muller ON 10/01/2022] predniSONE  2.5 mg Oral Q breakfast   Continuous Infusions:  PRN Meds: ALPRAZolam, levalbuterol, metoprolol tartrate, montelukast, ondansetron (ZOFRAN) IV, triamcinolone  Allergies:   Allergies  Allergen Reactions   Lactose Other (See Comments) and Diarrhea   Other Other (See Comments)    Pt ONLY tolerates SLOW-FE (slow released iron)---upsets IBS   Biaxin [Clarithromycin] Other (See Comments)    GI upset    Hydrocodone-Acetaminophen Itching    Tolerates acetaminophen    Oxycodone Itching   Sulfa Antibiotics Other (See Comments) and Diarrhea    GI upset  GI upset GI upset    Sulfasalazine Diarrhea    Social History:   Social History   Socioeconomic History   Marital status: Married    Spouse name: Jillyn Hidden   Number of children: 3   Years of education: Not on file   Highest education level: Not on file  Occupational History   Not on file  Tobacco Use   Smoking status: Never   Smokeless tobacco: Never  Vaping Use   Vaping  status: Never Used  Substance and Sexual Activity   Alcohol use: No   Drug use: No   Sexual activity: Not on file  Other Topics Concern   Not on file  Social History Narrative   Not on file  Social Determinants of Health   Financial Resource Strain: Not on file  Food Insecurity: Not on file  Transportation Needs: Not on file  Physical Activity: Not on file  Stress: Not on file  Social Connections: Not on file  Intimate Partner Violence: Not on file     Family History:   Family History  Problem Relation Age of Onset   Asthma Mother    Breast cancer Mother 37   Emphysema Father        smoked   Lung cancer Father        smoked   Coronary artery disease Father 1    ROS:  Review of Systems  Constitutional:  Positive for malaise/fatigue. Negative for chills, diaphoresis, fever and weight loss.  HENT:  Negative for congestion.   Eyes:  Negative for discharge and redness.  Respiratory:  Positive for shortness of breath. Negative for cough, sputum production and wheezing.   Cardiovascular:  Positive for palpitations. Negative for chest pain, orthopnea, claudication, leg swelling and PND.       Chest tightness  Gastrointestinal:  Negative for abdominal pain, heartburn, nausea and vomiting.  Musculoskeletal:  Negative for falls and myalgias.  Skin:  Negative for rash.  Neurological:  Positive for weakness. Negative for dizziness, tingling, tremors, sensory change, speech change, focal weakness and loss of consciousness.  Endo/Heme/Allergies:  Does not bruise/bleed easily.  Psychiatric/Behavioral:  Negative for substance abuse. The patient is not nervous/anxious.   All other systems reviewed and are negative.     Physical Exam/Data:   Vitals:   09/28/22 0640 09/28/22 0700 09/28/22 0948 09/28/22 1005  BP:  (!) 121/49  125/78  Pulse: 67 (!) 50  62  Resp: (!) 21 18  16   Temp:   97.7 F (36.5 C)   TempSrc:   Oral   SpO2: 100% 99%  100%  Weight:      Height:       No  intake or output data in the 24 hours ending 09/28/22 1221 Filed Weights   09/27/22 1742  Weight: 85.7 kg   Body mass index is 34.57 kg/m.   Physical Exam: General: Well developed, well nourished, in no acute distress. Head: Normocephalic, atraumatic, sclera non-icteric, no xanthomas, nares without discharge.  Neck: Negative for carotid bruits. JVD not elevated. Lungs: Clear bilaterally to auscultation without wheezes, rales, or rhonchi. Breathing is unlabored. Heart: RRR with S1 S2. No murmurs, rubs, or gallops appreciated. Abdomen: Soft, non-tender, non-distended with normoactive bowel sounds. No hepatomegaly. No rebound/guarding. No obvious abdominal masses. Msk:  Strength and tone appear normal for age. Extremities: No clubbing or cyanosis. No edema. Distal pedal pulses are 2+ and equal bilaterally. Neuro: Alert and oriented X 3. No facial asymmetry. No focal deficit. Moves all extremities spontaneously. Psych:  Responds to questions appropriately with a normal affect.   EKG:  The EKG was personally reviewed and demonstrates: Sinus rhythm with sinus arrhythmia, 61 bpm, low voltage QRS, nonspecific anterolateral ST-T changes Telemetry:  Telemetry was personally reviewed and demonstrates: Sinus rhythm with sinus bradycardia with rates in the 50s to 60s bpm with frequent paroxysms of SVT/atrial tachycardia into the 130s bpm occasionally following PVC  Weights: Filed Weights   09/27/22 1742  Weight: 85.7 kg    Relevant CV Studies:  Zio patch 04/2022:   The patient was monitored for 14 days.   The predominant rhythm was sinus with an average rate of 57 bpm while in sinus rhythm (range 42-101 bpm).   There  were rare PACs and PVCs.   25,709 episode of supraventricular tachycardia cardia occurred, lasting up to 40:12 minutes with a maximum rate of 222 bpm.   A single 5 beat run of nonsustained ventricular tachycardia was observed with a maximum rate of 174 bpm.   There was no  prolonged pause.   Patient triggered events corresponded to sinus rhythm, SVT, and PACs.   Sinus rhythm with numerous episodes of SVT lasting up to 40 minutes, as detailed above.  Rare PACs and PVCs as well as a single brief run of NSVT also noted. __________  2D echo 10/31/2021: 1. Left ventricular ejection fraction, by estimation, is 60 to 65%. The  left ventricle has normal function. The left ventricle has no regional  wall motion abnormalities. There is mild left ventricular hypertrophy.  Left ventricular diastolic parameters  are consistent with Grade II diastolic dysfunction (pseudonormalization).   2. Right ventricular systolic function is normal. The right ventricular  size is normal.   3. Left atrial size was severely dilated.   4. The mitral valve is normal in structure. Mild mitral valve  regurgitation.   5. The aortic valve is tricuspid. Aortic valve regurgitation is not  visualized.   6. The inferior vena cava is normal in size with greater than 50%  respiratory variability, suggesting right atrial pressure of 3 mmHg.  __________  2D echo 11/20/2020: 1. Left ventricular ejection fraction, by estimation, is 45 to 50%. The  left ventricle has mildly decreased function. The left ventricle  demonstrates global hypokinesis. The left ventricular internal cavity size  was mildly to moderately dilated. There  is mild concentric left ventricular hypertrophy. Left ventricular  diastolic parameters are consistent with Grade I diastolic dysfunction  (impaired relaxation).   2. Right ventricular systolic function is mildly reduced. The right  ventricular size is normal.   3. Left atrial size was severely dilated.   4. Right atrial size was moderately dilated.   5. The mitral valve is grossly normal. Mild to moderate mitral valve  regurgitation.   6. Tricuspid valve regurgitation is mild to moderate.   7. The aortic valve is calcified. Aortic valve regurgitation is not   visualized. Mild aortic valve sclerosis is present, with no evidence of  aortic valve stenosis.    Laboratory Data:  Chemistry Recent Labs  Lab 09/27/22 1806 09/28/22 1013  NA 140 141  K 3.7 4.2  CL 107 107  CO2 22 24  GLUCOSE 99 85  BUN 28* 29*  CREATININE 1.50* 1.52*  CALCIUM 8.7* 8.6*  GFRNONAA 36* 36*  ANIONGAP 11 10    Recent Labs  Lab 09/27/22 1806  PROT 6.0*  ALBUMIN 3.4*  AST 17  ALT 15  ALKPHOS 66  BILITOT 0.6   Hematology Recent Labs  Lab 09/27/22 1806 09/28/22 1013  WBC 9.5 8.0  RBC 3.53* 3.20*  HGB 11.6* 10.6*  HCT 36.6 33.2*  MCV 103.7* 103.8*  MCH 32.9 33.1  MCHC 31.7 31.9  RDW 13.9 14.0  PLT 198 178   Cardiac EnzymesNo results for input(s): "TROPONINI" in the last 168 hours. No results for input(s): "TROPIPOC" in the last 168 hours.  BNP Recent Labs  Lab 09/27/22 1806  BNP 241.2*    DDimer No results for input(s): "DDIMER" in the last 168 hours.  Radiology/Studies:  US THYROID  Result Date: 09/28/2022 IMPRESSION: 1. Heterogeneous and mildly enlarged thyroid gland. 2. Adjacent and abutting TI-RADS category 4 nodules in the left mid and left lower gland  are noted. Both meet criteria to consider fine-needle aspiration biopsy. Non urgent biopsy is recommended. The above is in keeping with the ACR TI-RADS recommendations - J Am Coll Radiol 2017;14:587-595. Electronically Signed   By: Malachy Moan M.D.   On: 09/28/2022 11:34   CT Angio Chest PE W and/or Wo Contrast  Result Date: 09/27/2022 IMPRESSION: 1. No evidence of pulmonary embolism or acute cardiopulmonary disease. 2. Multiple heterogeneous thyroid nodules within an enlarged left lobe of the thyroid gland. Further evaluation with nonemergent thyroid ultrasound is recommended. This follows ACR consensus guidelines: Managing Incidental Thyroid Nodules Detected on Imaging: White Paper of the ACR Incidental Thyroid Findings Committee. J Am Coll Radiol 2015; 12:143-150. 3. Small hiatal  hernia. 4. Aortic atherosclerosis. Aortic Atherosclerosis (ICD10-I70.0). Electronically Signed   By: Aram Candela M.D.   On: 09/27/2022 20:36   DG Chest Port 1 View  Result Date: 09/27/2022 IMPRESSION: Cardiomegaly. No radiographic evidence of acute cardiopulmonary process. Electronically Signed   By: Lesia Hausen M.D.   On: 09/27/2022 19:37    Assessment and Plan:   1.  Coronary artery calcification with unstable angina: -Currently without symptoms of angina -Ruled out -Will need to discuss with interventional cardiology regarding potential timing of R/LHC on 9/13 given she received apixaban at 1 AM on 9/12 -Hold Eliquis, has been maintained on this with history of PE, though was only taking once daily (last dose 1 AM on 9/12)  2.  Paroxysmal atrial tachycardia/SVT: -Likely exacerbating her underlying chronic dyspnea -Prior outpatient cardiac monitoring showed significant SVT burden with 25,709 episodes lasting up to 40 minutes and 12 seconds -Management of her SVT has been somewhat limited given underlying pulmonary disease and medication interactions with Celexa -Recommend continuing PTA Cardizem CD 180 mg daily (not continued on admission), previously on beta-blocker without good symptomatic control -Recommend revisiting with EP in the outpatient setting -TSH, magnesium, and potassium normal  3.  Chronic dyspnea with asthma and history of PE: -She was diagnosed with a PE in 11/2020 with persistent dyspnea since -Has been maintained on apixaban 5 mg twice daily since, though has only been taking apixaban once daily, if this is to be continued, would recommend she take twice daily -Will pursue diagnostic R/LHC as outlined above  4.  Macrocytic anemia: -Stable -Followed by hematology  5.  Multiple thyroid nodules: -Status post ultrasound recommendation for nonemergent biopsy -Will need outpatient follow-up with PCP    Informed Consent   Shared Decision Making/Informed  Consent{  The risks [stroke (1 in 1000), death (1 in 1000), kidney failure [usually temporary] (1 in 500), bleeding (1 in 200), allergic reaction [possibly serious] (1 in 200)], benefits (diagnostic support and management of coronary artery disease) and alternatives of a cardiac catheterization were discussed in detail with Ms. Descoteaux and she is willing to proceed.       For questions or updates, please contact [facility] Please consult [email or web address] for contact info under Cardiology/STEMI.   Signed, Eula Listen, PA-C [facility] Pager: [phone] 09/28/2022, 12:21 PM

## 2022-09-28 NOTE — Assessment & Plan Note (Addendum)
Cardiology is recommending antiarrhythmic therapy Has done well with initiation of amiodarone and will complete load as per cardiology - 400 mg bid x 7 days (9/21), 200 mg bid x 7 days (9/28), then 200 mg daily thereafter (10/5) Continue diltiazem Citalopram was stopped - she is likely to need an alternate SSRI/SNRI if possible given her significant anxiety (had a panic attack already once in the ER)

## 2022-09-28 NOTE — Assessment & Plan Note (Signed)
Continue leflunomide, Plaquenil, prednisone

## 2022-09-28 NOTE — Telephone Encounter (Signed)
Keep follow up appt in Nov.

## 2022-09-28 NOTE — ED Notes (Addendum)
Pt called out to nurses station, pt found to be very anxious. Requesting medication to help her anxiety and sleep. Pt assisted to the restroom and back to bed safely.

## 2022-09-28 NOTE — Assessment & Plan Note (Addendum)
Eliquis on hold for DAPT per cardiology 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 (last in 11/2020) - resume when done with DAPT (at least 6 months)

## 2022-09-28 NOTE — Assessment & Plan Note (Addendum)
Appears to be stable for now.

## 2022-09-28 NOTE — Assessment & Plan Note (Addendum)
Also with h/o SVT Rate controlled with diltiazem Cardiology also added amiodarone Eliquis is on hold for now

## 2022-09-28 NOTE — Assessment & Plan Note (Addendum)
Presented to cardiology office with CP, noted to have runs of SVT Sent by cardiology for admission, likely to need cath Intermittent tachycardia with HR as high as 140-150 and improves with rest/deep breathing.  EKG shows HR of 94 and SR with frequent PAC.  CTA chest is negative, no evidence of PE or CAD Clinically improved with [place] O2 although not hypoxic Cardiology consulted Cardiac cath on 9/13 Last dose of Eliquis was 9/12 - will not restart at this time since it has been >12 months since PE per cardiology Stent placed on repeat cath on 9/16 Doing well, ok for dc to home  Needs DAPT for at least 6 months Needs ongoing high-dose Lipitor Referred for outpatient cardiac rehab

## 2022-09-28 NOTE — ED Notes (Signed)
Requested lab tech for type and screen blood draw.

## 2022-09-28 NOTE — ED Notes (Signed)
Pt ambulated to restroom and back to bed safely with family assistance

## 2022-09-28 NOTE — Telephone Encounter (Signed)
PT daughter called and stated the pt has been hospitalized. I have cancelled her upcoming iron infusion. She also stated that her blood count has dropped. She wanted Dr. Cathie Hoops to know.

## 2022-09-28 NOTE — Assessment & Plan Note (Addendum)
On citalopram and alprazolam at home Since citalopram needs to be stopped , an alternative agent may need to be considered Continue alprazolam

## 2022-09-28 NOTE — Assessment & Plan Note (Addendum)
-  Appears to be stable at this time -Attempt to avoid nephrotoxic medications

## 2022-09-28 NOTE — Assessment & Plan Note (Signed)
-   Continue Pepcid, Protonix ?

## 2022-09-28 NOTE — Assessment & Plan Note (Signed)
Body mass index is 34.57 kg/m.Marland Kitchen  Weight loss should be encouraged Outpatient PCP/bariatric medicine f/u encouraged

## 2022-09-28 NOTE — Assessment & Plan Note (Addendum)
Thyroid US to evaluate as thyroid had multiple heterogenous thyroid nodules Normal TSH Thyroid US:  Heterogeneous and mildly enlarged thyroid gland.  2. Adjacent and abutting TI-RADS category 4 nodules in the left mid and left lower gland are noted. Both meet criteria to consider fine-needle aspiration biopsy. Non urgent biopsy is recommended. Will defer to PCP but will need further evaluation.

## 2022-09-28 NOTE — Progress Notes (Signed)
Progress Note   Patient: Courtney Wilcox ZOX:[id number] DOB: 1946-01-03 DOA: 09/27/2022     1 DOS: the patient was seen and examined on 09/28/2022   Brief hospital course:  77yo with h/o SVT, depression, HTN, OSA, RA, and PE who presented on 9/11 with chest pain with runs of SVT, sent by cardiology.  CTA negative for PE, has multiple thyroid nodules.  She was recently diagnosed with a sinus infection and is being treated with Ceftin and prednisone.  Cardiology was concerned for unstable angina; she is likely to need cardiac catheterization on 9/13 after Eliquis washout.  Assessment and Plan: * Unstable angina [facility]) Presented to cardiology office with CP, noted to have runs of SVT Sent by cardiology for admission, likely to need cath Intermittent tachycardia with HR as high as 140-150 and improves with rest/deep breathing.  EKG shows HR of 94 and SR with frequent PAC.  CTA chest is negative, no evidence of PE or CAD Clinically improved with [place] O2 although not hypoxic Cardiology consulted Plan for cardiac cath on 9/13 Last dose of Eliquis was 9/12  Rheumatoid arthritis (HCC) Continue leflunomide, Plaquenil, prednisone  Anemia of chronic disease Appears to be stable Will follow  GERD without esophagitis Continue Pepcid, Protonix  Chronic kidney disease, stage 3b (HCC) Appears to be stable at this time Attempt to avoid nephrotoxic medications Recheck BMP in AM  Given need for contrast dye tomorrow, will order gentle IVF infusion  Class 1 obesity due to excess calories with body mass index (BMI) of 34.0 to 34.9 in adult Body mass index is 34.57 kg/m.Marland Kitchen  Weight loss should be encouraged Outpatient PCP/bariatric medicine f/u encouraged   History of pulmonary embolism Eliquis on hold pending cath 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 (last in 11/2020)  History of atrial flutter Also with h/o SVT Rate controlled with diltiazem Cards is considering antiarrhythmic Eliquis is  on hold for now  PSVT (paroxysmal supraventricular tachycardia) Cardiology is recommending antiarrhythmic therapy Currently reate controlled with diltiazem Will defer to cardiology Citalopram was stopped - she is likely to need an alternate SSRI/SNRI if possible given her significant anxiety (had a panic attack already once in the ER)  Multiple thyroid nodules Thyroid US to evaluate as thyroid had multiple heterogenous thyroid nodules Normal TSH Thyroid US:  Heterogeneous and mildly enlarged thyroid gland.  2. Adjacent and abutting TI-RADS category 4 nodules in the left mid and left lower gland are noted. Both meet criteria to consider fine-needle aspiration biopsy. Non urgent biopsy is recommended. Will defer to PCP but will need further evaluation.  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 Emergency Department at [facility]  30 Day Unplanned Readmission Risk Score (%) 19.52 Filed at 09/28/2022 0801       This score is the patient's risk of an unplanned readmission within 30 days of being discharged (0 -100%). The score is based on dignosis, age, lab data, medications, orders, and past utilization.   Low:  0-14.9   Medium: 15-21.9   High: 22-29.9   Extreme: 30 and above           Subjective: Continuing to have  mild substernal chest discomfort.  Agrees to stay for cath but only if it will happen tomorrow.  She has had long-standing SOB that pulm blamed on her heart and cards blamed on her lungs, so she is eager to have cath and get an answer.   Objective: Vitals:   09/28/22 1005  09/28/22 1355  BP: 125/78 136/69  Pulse: 62   Resp: 16   Temp:    SpO2: 100%    No intake or output data in the 24 hours ending 09/28/22 1632 Filed Weights   09/27/22 1742  Weight: 85.7 kg    Exam:  General:  Appears calm and comfortable and is in NAD Eyes:  EOMI, normal lids, iris ENT:  grossly normal hearing, lips & tongue, mmm Neck:  no LAD, masses or thyromegaly Cardiovascular:  RRR, no m/r/g. No LE edema.  Respiratory:   CTA bilaterally with no wheezes/rales/rhonchi.  Normal respiratory effort. Abdomen:  soft, NT, ND Skin:  no rash or induration seen on limited exam Musculoskeletal:  grossly normal tone BUE/BLE, good ROM, no bony abnormality Psychiatric:  blunted mood and affect, speech fluent and appropriate, AOx3 Neurologic:  CN 2-12 grossly intact, moves all extremities in coordinated fashion  Data Reviewed: I have reviewed the patient's lab results since admission.  Pertinent labs for today include:   BUN 29/Creatinine 1.52/GFR 36 - stable WBC 8 Hgb 10.6 - stable TSH 3.085    Family Communication: None present  Disposition: Status is: Inpatient Remains inpatient appropriate because: cath tomorrow  Planned Discharge Destination: Home    Time spent: 50 minutes  Author: Jonah Blue, MD 09/28/2022 4:32 PM  For on call review [email or web address].

## 2022-09-29 ENCOUNTER — Encounter
Admission: EM | Disposition: A | Discharge status: Home / Self Care | Payer: Self-pay | Source: Home / Self Care | Attending: Internal Medicine

## 2022-09-29 ENCOUNTER — Inpatient Hospital Stay: Payer: Medicare Other

## 2022-09-29 ENCOUNTER — Other Ambulatory Visit: Payer: Self-pay

## 2022-09-29 ENCOUNTER — Inpatient Hospital Stay (HOSPITAL_COMMUNITY)
Admit: 2022-09-29 | Discharge: 2022-09-29 | Disposition: A | Discharge status: Home / Self Care | Payer: Medicare Other | Attending: Cardiovascular Disease | Admitting: Cardiovascular Disease

## 2022-09-29 DIAGNOSIS — E042 Nontoxic multinodular goiter: Secondary | ICD-10-CM

## 2022-09-29 DIAGNOSIS — I25110 Atherosclerotic heart disease of native coronary artery with unstable angina pectoris: Secondary | ICD-10-CM | POA: Diagnosis not present

## 2022-09-29 DIAGNOSIS — D638 Anemia in other chronic diseases classified elsewhere: Secondary | ICD-10-CM | POA: Diagnosis not present

## 2022-09-29 DIAGNOSIS — K219 Gastro-esophageal reflux disease without esophagitis: Secondary | ICD-10-CM | POA: Diagnosis not present

## 2022-09-29 DIAGNOSIS — N1832 Chronic kidney disease, stage 3b: Secondary | ICD-10-CM

## 2022-09-29 DIAGNOSIS — Z86711 Personal history of pulmonary embolism: Secondary | ICD-10-CM | POA: Diagnosis not present

## 2022-09-29 DIAGNOSIS — R079 Chest pain, unspecified: Secondary | ICD-10-CM

## 2022-09-29 DIAGNOSIS — I471 Supraventricular tachycardia, unspecified: Secondary | ICD-10-CM | POA: Diagnosis not present

## 2022-09-29 DIAGNOSIS — I209 Angina pectoris, unspecified: Secondary | ICD-10-CM

## 2022-09-29 DIAGNOSIS — I200 Unstable angina: Secondary | ICD-10-CM | POA: Diagnosis not present

## 2022-09-29 HISTORY — PX: RIGHT/LEFT HEART CATH AND CORONARY ANGIOGRAPHY: CATH118266

## 2022-09-29 LAB — ECHOCARDIOGRAM COMPLETE
AR max vel: 3.41 cm2
AV Area VTI: 3.34 cm2
AV Area mean vel: 3.1 cm2
AV Mean grad: 4 mmHg
AV Peak grad: 6.2 mmHg
Ao pk vel: 1.24 m/s
Area-P 1/2: 3.31 cm2
Height: 62 in
S' Lateral: 3.1 cm
Weight: 3024 [oz_av]

## 2022-09-29 LAB — BASIC METABOLIC PANEL
Anion gap: 9 (ref 5–15)
BUN: 31 mg/dL — ABNORMAL HIGH (ref 8–23)
CO2: 24 mmol/L (ref 22–32)
Calcium: 8.4 mg/dL — ABNORMAL LOW (ref 8.9–10.3)
Chloride: 106 mmol/L (ref 98–111)
Creatinine, Ser: 1.59 mg/dL — ABNORMAL HIGH (ref 0.44–1.00)
GFR, Estimated: 34 mL/min — ABNORMAL LOW (ref >60)
Glucose, Bld: 109 mg/dL — ABNORMAL HIGH (ref 70–99)
Potassium: 4.3 mmol/L (ref 3.5–5.1)
Sodium: 139 mmol/L (ref 135–145)

## 2022-09-29 LAB — CBC WITH DIFFERENTIAL/PLATELET
Abs Immature Granulocytes: 0.04 10*3/uL (ref 0.00–0.07)
Basophils Absolute: 0 10*3/uL (ref 0.0–0.1)
Basophils Relative: 0 %
Eosinophils Absolute: 0 10*3/uL (ref 0.0–0.5)
Eosinophils Relative: 0 %
HCT: 33 % — ABNORMAL LOW (ref 36.0–46.0)
Hemoglobin: 10.7 g/dL — ABNORMAL LOW (ref 12.0–15.0)
Immature Granulocytes: 1 %
Lymphocytes Relative: 9 %
Lymphs Abs: 0.7 10*3/uL (ref 0.7–4.0)
MCH: 33.4 pg (ref 26.0–34.0)
MCHC: 32.4 g/dL (ref 30.0–36.0)
MCV: 103.1 fL — ABNORMAL HIGH (ref 80.0–100.0)
Monocytes Absolute: 0.6 10*3/uL (ref 0.1–1.0)
Monocytes Relative: 8 %
Neutro Abs: 6.8 10*3/uL (ref 1.7–7.7)
Neutrophils Relative %: 82 %
Platelets: 168 10*3/uL (ref 150–400)
RBC: 3.2 MIL/uL — ABNORMAL LOW (ref 3.87–5.11)
RDW: 13.8 % (ref 11.5–15.5)
WBC: 8.3 10*3/uL (ref 4.0–10.5)
nRBC: 0 % (ref 0.0–0.2)

## 2022-09-29 SURGERY — RIGHT/LEFT HEART CATH AND CORONARY ANGIOGRAPHY
Anesthesia: Moderate Sedation

## 2022-09-29 MED ORDER — HEPARIN SODIUM (PORCINE) 1000 UNIT/ML IJ SOLN
INTRAMUSCULAR | Status: DC | PRN
  Administered 2022-09-29: 3000 [IU] via INTRAVENOUS

## 2022-09-29 MED ORDER — SODIUM CHLORIDE 0.9 % IV SOLN: 250.0000 mL | INTRAVENOUS | Status: DC | PRN

## 2022-09-29 MED ORDER — LIDOCAINE HCL (PF) 1 % IJ SOLN
INTRAMUSCULAR | Status: DC | PRN
  Administered 2022-09-29 (×2): 2 mL

## 2022-09-29 MED ORDER — SODIUM CHLORIDE 0.9% FLUSH: 3.0000 mL | INTRAVENOUS | Status: DC | PRN

## 2022-09-29 MED ORDER — SODIUM CHLORIDE 0.9 % IV SOLN: INTRAVENOUS | Status: DC

## 2022-09-29 MED ORDER — FENTANYL CITRATE (PF) 100 MCG/2ML IJ SOLN
INTRAMUSCULAR | Status: AC
  Filled 2022-09-29: qty 2

## 2022-09-29 MED ORDER — CLOPIDOGREL BISULFATE 75 MG PO TABS
ORAL_TABLET | ORAL | Status: AC
  Filled 2022-09-29: qty 1

## 2022-09-29 MED ORDER — MIDAZOLAM HCL 2 MG/2ML IJ SOLN
INTRAMUSCULAR | Status: DC | PRN
  Administered 2022-09-29: 1 mg via INTRAVENOUS

## 2022-09-29 MED ORDER — ASPIRIN 81 MG PO CHEW
81.0000 mg | CHEWABLE_TABLET | Freq: Every day | ORAL | Status: DC
  Administered 2022-09-29 – 2022-10-03 (×5): 81 mg via ORAL
  Filled 2022-09-29 (×3): qty 1

## 2022-09-29 MED ORDER — VERAPAMIL HCL 2.5 MG/ML IV SOLN
INTRAVENOUS | Status: AC
  Filled 2022-09-29: qty 2

## 2022-09-29 MED ORDER — IOHEXOL 300 MG/ML  SOLN
INTRAMUSCULAR | Status: DC | PRN
  Administered 2022-09-29: 48 mL

## 2022-09-29 MED ORDER — MIDAZOLAM HCL 2 MG/2ML IJ SOLN
INTRAMUSCULAR | Status: AC
  Filled 2022-09-29: qty 2

## 2022-09-29 MED ORDER — ASPIRIN 81 MG PO CHEW: 81.0000 mg | CHEWABLE_TABLET | ORAL | Status: DC

## 2022-09-29 MED ORDER — ATORVASTATIN CALCIUM 80 MG PO TABS
80.0000 mg | ORAL_TABLET | Freq: Every day | ORAL | Status: DC
  Administered 2022-09-30 – 2022-10-03 (×4): 80 mg via ORAL
  Filled 2022-09-29 (×7): qty 1

## 2022-09-29 MED ORDER — FENTANYL CITRATE (PF) 100 MCG/2ML IJ SOLN
INTRAMUSCULAR | Status: DC | PRN
  Administered 2022-09-29: 25 ug via INTRAVENOUS

## 2022-09-29 MED ORDER — ACETAMINOPHEN 325 MG PO TABS
ORAL_TABLET | ORAL | Status: AC
  Filled 2022-09-29: qty 2

## 2022-09-29 MED ORDER — HEPARIN SODIUM (PORCINE) 1000 UNIT/ML IJ SOLN
INTRAMUSCULAR | Status: AC
  Filled 2022-09-29: qty 10

## 2022-09-29 MED ORDER — ALPRAZOLAM 0.5 MG PO TABS
0.5000 mg | ORAL_TABLET | Freq: Every evening | ORAL | Status: DC | PRN
  Administered 2022-09-29 – 2022-10-02 (×4): 0.5 mg via ORAL
  Filled 2022-09-29 (×4): qty 1

## 2022-09-29 MED ORDER — CLOPIDOGREL BISULFATE 75 MG PO TABS
75.0000 mg | ORAL_TABLET | Freq: Every day | ORAL | Status: DC
  Administered 2022-09-29 – 2022-10-03 (×4): 75 mg via ORAL
  Filled 2022-09-29 (×4): qty 1

## 2022-09-29 MED ORDER — VERAPAMIL HCL 2.5 MG/ML IV SOLN
INTRAVENOUS | Status: DC | PRN
  Administered 2022-09-29: 2.5 mg via INTRA_ARTERIAL

## 2022-09-29 MED ORDER — METOPROLOL TARTRATE 5 MG/5ML IV SOLN
INTRAVENOUS | Status: AC
  Filled 2022-09-29: qty 5

## 2022-09-29 MED ORDER — HEPARIN (PORCINE) IN NACL 1000-0.9 UT/500ML-% IV SOLN
INTRAVENOUS | Status: DC | PRN
  Administered 2022-09-29 (×2): 500 mL

## 2022-09-29 MED ORDER — ENOXAPARIN SODIUM 40 MG/0.4ML IJ SOSY
40.0000 mg | PREFILLED_SYRINGE | INTRAMUSCULAR | Status: DC
  Administered 2022-09-30 – 2022-10-03 (×3): 40 mg via SUBCUTANEOUS
  Filled 2022-09-29 (×5): qty 0.4

## 2022-09-29 MED ORDER — AMIODARONE HCL 200 MG PO TABS
400.0000 mg | ORAL_TABLET | Freq: Two times a day (BID) | ORAL | Status: DC
  Administered 2022-09-29 – 2022-10-03 (×8): 400 mg via ORAL
  Filled 2022-09-29 (×13): qty 2

## 2022-09-29 MED ORDER — SODIUM CHLORIDE 0.9% FLUSH
3.0000 mL | Freq: Two times a day (BID) | INTRAVENOUS | Status: DC
  Administered 2022-09-30 – 2022-10-01 (×3): 3 mL via INTRAVENOUS

## 2022-09-29 MED ORDER — SODIUM CHLORIDE 0.9 % IV SOLN: INTRAVENOUS | Status: AC

## 2022-09-29 MED ORDER — HEPARIN (PORCINE) IN NACL 1000-0.9 UT/500ML-% IV SOLN
INTRAVENOUS | Status: AC
  Filled 2022-09-29: qty 1000

## 2022-09-29 MED ORDER — ASPIRIN 81 MG PO CHEW
CHEWABLE_TABLET | ORAL | Status: AC
  Filled 2022-09-29: qty 1

## 2022-09-29 SURGICAL SUPPLY — 16 items
CATH BALLN WEDGE 5F 110CM (CATHETERS) IMPLANT
CATH INFINITI 5 FR JL3.5 (CATHETERS) IMPLANT
CATH INFINITI 5FR JK (CATHETERS) IMPLANT
CATH INFINITI JR4 5F (CATHETERS) IMPLANT
DEVICE RAD TR BAND REGULAR (VASCULAR PRODUCTS) IMPLANT
DRAPE BRACHIAL (DRAPES) IMPLANT
GLIDESHEATH SLEND SS 6F .021 (SHEATH) IMPLANT
GUIDEWIRE EMER 3M J .025X150CM (WIRE) IMPLANT
GUIDEWIRE INQWIRE 1.5J.035X260 (WIRE) IMPLANT
INQWIRE 1.5J .035X260CM (WIRE) ×1
PACK CARDIAC CATH (CUSTOM PROCEDURE TRAY) ×1 IMPLANT
PROTECTION STATION PRESSURIZED (MISCELLANEOUS) ×1
SET ATX-X65L (MISCELLANEOUS) IMPLANT
SHEATH GLIDE SLENDER 4/5FR (SHEATH) IMPLANT
STATION PROTECTION PRESSURIZED (MISCELLANEOUS) IMPLANT
WIRE RUNTHROUGH .014X180CM (WIRE) IMPLANT

## 2022-09-29 NOTE — Discharge Instructions (Signed)
Discuss with your PCP regarding Thyroid work up as out pt

## 2022-09-29 NOTE — Plan of Care (Signed)
Problem: Coping: Goal: Level of anxiety will decrease Outcome: Progressing   Problem: Pain Managment: Goal: General experience of comfort will improve Outcome: Progressing   Problem: Safety: Goal: Ability to remain free from injury will improve Outcome: Progressing

## 2022-09-29 NOTE — Care Management Important Message (Signed)
Important Message  Patient Details  Name: Courtney Wilcox MRN: [id number] Date of Birth: 1945/06/06   Medicare Important Message Given:  Yes     Johnell Comings 09/29/2022, 12:54 PM

## 2022-09-29 NOTE — Progress Notes (Signed)
Dr. Kirke Corin in at bedside speaking with pt. And her daughter re: cath results. Pt. C/o HA: med. Now with tylenol 650 mg p.o.. Both pt. And daughter verbalized understanding of conversation with MD.

## 2022-09-29 NOTE — Progress Notes (Signed)
*  PRELIMINARY RESULTS* Echocardiogram 2D Echocardiogram has been performed.  Laddie Aquas 09/29/2022, 2:19 PM

## 2022-09-29 NOTE — Plan of Care (Signed)
  Problem: Education: Goal: Understanding of cardiac disease, CV risk reduction, and recovery process will improve Outcome: Progressing Goal: Individualized Educational Video(s) Outcome: Progressing   Problem: Education: Goal: Understanding of cardiac disease, CV risk reduction, and recovery process will improve Outcome: Progressing Goal: Individualized Educational Video(s) Outcome: Progressing   Problem: Activity: Goal: Ability to tolerate increased activity will improve Outcome: Progressing   Problem: Cardiac: Goal: Ability to achieve and maintain adequate cardiovascular perfusion will improve Outcome: Progressing   Problem: Cardiovascular: Goal: Ability to achieve and maintain adequate cardiovascular perfusion will improve Outcome: Progressing Goal: Vascular access site(s) Level 0-1 will be maintained Outcome: Progressing   Problem: Activity: Goal: Ability to return to baseline activity level will improve Outcome: Progressing   Problem: Health Behavior/Discharge Planning: Goal: Ability to safely manage health-related needs after discharge will improve Outcome: Progressing   Problem: Clinical Measurements: Goal: Ability to maintain clinical measurements within normal limits will improve Outcome: Progressing Goal: Will remain free from infection Outcome: Progressing Goal: Diagnostic test results will improve Outcome: Progressing Goal: Respiratory complications will improve Outcome: Progressing Goal: Cardiovascular complication will be avoided Outcome: Progressing   Problem: Pain Managment: Goal: General experience of comfort will improve Outcome: Progressing   Problem: Safety: Goal: Ability to remain free from injury will improve Outcome: Progressing   Problem: Skin Integrity: Goal: Risk for impaired skin integrity will decrease Outcome: Progressing

## 2022-09-29 NOTE — Progress Notes (Signed)
Progress Note  Patient Name: Courtney Wilcox Date of Encounter: 09/29/2022  Primary Cardiologist: End Primary Electrophysiologist: Lalla Brothers   Subjective   She continues to have very frequent short episodes of SVT. Planning for [facility] this afternoon (1530). No chest pain. Dyspnea stable. No palpitations. No dizziness, presyncope, or syncope. Pre-cath labs stable.   Inpatient Medications    Scheduled Meds:  citalopram  30 mg Oral QPM   diltiazem  180 mg Oral Daily   hydroxychloroquine  200 mg Oral Daily   leflunomide  10 mg Oral Daily   pantoprazole  40 mg Oral BID   predniSONE  5 mg Oral BID WC   Followed by   Melene Muller ON 09/30/2022] predniSONE  5 mg Oral Q breakfast   Followed by   Melene Muller ON 10/01/2022] predniSONE  2.5 mg Oral Q breakfast   Continuous Infusions:  PRN Meds: acetaminophen, ALPRAZolam, levalbuterol, metoprolol tartrate, montelukast, ondansetron (ZOFRAN) IV, triamcinolone   Vital Signs    Vitals:   09/28/22 1837 09/28/22 1908 09/28/22 2342 09/29/22 0357  BP:  (!) 151/81 (!) 161/83 124/63  Pulse:  70 (!) 51 62  Resp:  18 20 16   Temp: 97.8 F (36.6 C) 98.3 F (36.8 C) 98.7 F (37.1 C) 98.4 F (36.9 C)  TempSrc: Oral Oral    SpO2:  100% 100% 100%  Weight:      Height:       No intake or output data in the 24 hours ending 09/29/22 0736 Filed Weights   09/27/22 1742  Weight: 85.7 kg    Telemetry    SR with frequent short episodes of SVT into the 130s bpm - Personally Reviewed  ECG    No new tracings - Personally Reviewed  Physical Exam   GEN: No acute distress.   Neck: No JVD. Cardiac: RRR, no murmurs, rubs, or gallops.  Respiratory: Clear to auscultation bilaterally.  GI: Soft, nontender, non-distended.   MS: No edema; No deformity. Neuro:  Alert and oriented x 3; Nonfocal.  Psych: Normal affect.  Labs    Chemistry Recent Labs  Lab 09/27/22 1806 09/28/22 1013 09/29/22 0554  NA 140 141 139  K 3.7 4.2 4.3  CL 107 107 106  CO2  22 24 24   GLUCOSE 99 85 109*  BUN 28* 29* 31*  CREATININE 1.50* 1.52* 1.59*  CALCIUM 8.7* 8.6* 8.4*  PROT 6.0*  --   --   ALBUMIN 3.4*  --   --   AST 17  --   --   ALT 15  --   --   ALKPHOS 66  --   --   BILITOT 0.6  --   --   GFRNONAA 36* 36* 34*  ANIONGAP 11 10 9      Hematology Recent Labs  Lab 09/27/22 1806 09/28/22 1013 09/29/22 0554  WBC 9.5 8.0 8.3  RBC 3.53* 3.20* 3.20*  HGB 11.6* 10.6* 10.7*  HCT 36.6 33.2* 33.0*  MCV 103.7* 103.8* 103.1*  MCH 32.9 33.1 33.4  MCHC 31.7 31.9 32.4  RDW 13.9 14.0 13.8  PLT 198 178 168    Cardiac EnzymesNo results for input(s): "TROPONINI" in the last 168 hours. No results for input(s): "TROPIPOC" in the last 168 hours.   BNP Recent Labs  Lab 09/27/22 1806  BNP 241.2*     DDimer No results for input(s): "DDIMER" in the last 168 hours.   Radiology    US THYROID  Result Date: 09/28/2022 IMPRESSION: 1. Heterogeneous and mildly enlarged  thyroid gland. 2. Adjacent and abutting TI-RADS category 4 nodules in the left mid and left lower gland are noted. Both meet criteria to consider fine-needle aspiration biopsy. Non urgent biopsy is recommended. The above is in keeping with the ACR TI-RADS recommendations - J Am Coll Radiol 2017;14:587-595. Electronically Signed   By: Malachy Moan M.D.   On: 09/28/2022 11:34   CT Angio Chest PE W and/or Wo Contrast  Result Date: 09/27/2022 IMPRESSION: 1. No evidence of pulmonary embolism or acute cardiopulmonary disease. 2. Multiple heterogeneous thyroid nodules within an enlarged left lobe of the thyroid gland. Further evaluation with nonemergent thyroid ultrasound is recommended. This follows ACR consensus guidelines: Managing Incidental Thyroid Nodules Detected on Imaging: White Paper of the ACR Incidental Thyroid Findings Committee. J Am Coll Radiol 2015; 12:143-150. 3. Small hiatal hernia. 4. Aortic atherosclerosis. Aortic Atherosclerosis (ICD10-I70.0). Electronically Signed   By: Aram Candela M.D.   On: 09/27/2022 20:36   DG Chest Port 1 View  Result Date: 09/27/2022 IMPRESSION: Cardiomegaly. No radiographic evidence of acute cardiopulmonary process. Electronically Signed   By: Lesia Hausen M.D.   On: 09/27/2022 19:37    Cardiac Studies   Zio patch 04/2022:   The patient was monitored for 14 days.   The predominant rhythm was sinus with an average rate of 57 bpm while in sinus rhythm (range 42-101 bpm).   There were rare PACs and PVCs.   25,709 episode of supraventricular tachycardia cardia occurred, lasting up to 40:12 minutes with a maximum rate of 222 bpm.   A single 5 beat run of nonsustained ventricular tachycardia was observed with a maximum rate of 174 bpm.   There was no prolonged pause.   Patient triggered events corresponded to sinus rhythm, SVT, and PACs.   Sinus rhythm with numerous episodes of SVT lasting up to 40 minutes, as detailed above.  Rare PACs and PVCs as well as a single brief run of NSVT also noted. __________   2D echo 10/31/2021: 1. Left ventricular ejection fraction, by estimation, is 60 to 65%. The  left ventricle has normal function. The left ventricle has no regional  wall motion abnormalities. There is mild left ventricular hypertrophy.  Left ventricular diastolic parameters  are consistent with Grade II diastolic dysfunction (pseudonormalization).   2. Right ventricular systolic function is normal. The right ventricular  size is normal.   3. Left atrial size was severely dilated.   4. The mitral valve is normal in structure. Mild mitral valve  regurgitation.   5. The aortic valve is tricuspid. Aortic valve regurgitation is not  visualized.   6. The inferior vena cava is normal in size with greater than 50%  respiratory variability, suggesting right atrial pressure of 3 mmHg.  __________   2D echo 11/20/2020: 1. Left ventricular ejection fraction, by estimation, is 45 to 50%. The  left ventricle has mildly decreased function. The  left ventricle  demonstrates global hypokinesis. The left ventricular internal cavity size  was mildly to moderately dilated. There  is mild concentric left ventricular hypertrophy. Left ventricular  diastolic parameters are consistent with Grade I diastolic dysfunction  (impaired relaxation).   2. Right ventricular systolic function is mildly reduced. The right  ventricular size is normal.   3. Left atrial size was severely dilated.   4. Right atrial size was moderately dilated.   5. The mitral valve is grossly normal. Mild to moderate mitral valve  regurgitation.   6. Tricuspid valve regurgitation is mild to  moderate.   7. The aortic valve is calcified. Aortic valve regurgitation is not  visualized. Mild aortic valve sclerosis is present, with no evidence of  aortic valve stenosis.   Patient Profile     77 y.o. 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 Allena Katz.   Assessment & Plan    1. Coronary artery calcification with unstable angina: -Currently without symptoms of angina -Ruled out -Planning for [facility] this afternoon (1530) -Clears ok until 10 AM, then NPO except sips with meds -PTA Eliquis (history of PE) on hold   2.  Paroxysmal atrial tachycardia/SVT: -Likely exacerbating her underlying chronic dyspnea -Prior outpatient cardiac monitoring showed significant SVT burden with 25,709 episodes lasting up to 40 minutes and 12 seconds -Management of her SVT has been somewhat limited given underlying pulmonary disease and medication interactions with Celexa -She continues to have very frequent, short episodes of SVT on tele this admission, though denies any palpitations  -Recommend continuing PTA  Cardizem CD 180 mg daily, baseline sinus rate precludes titration at this time -Previously on beta-blocker without good symptomatic control -Recommend revisiting with EP in the outpatient setting given significant SVT burden -TSH, magnesium, and potassium normal   3.  Chronic dyspnea with asthma and history of PE: -She was diagnosed with a PE in 11/2020 with persistent dyspnea since -Has been maintained on apixaban 5 mg twice daily since, though has only been taking apixaban once daily, if this is to be continued, would recommend she take twice daily -Will pursue diagnostic R/LHC as outlined above   4.  Macrocytic anemia: -Stable -Followed by hematology   5.  Multiple thyroid nodules: -Status post ultrasound recommendation for nonemergent biopsy -Will need outpatient follow-up with PCP   Informed Consent   Shared Decision Making/Informed Consent{  The risks [stroke (1 in 1000), death (1 in 1000), kidney failure [usually temporary] (1 in 500), bleeding (1 in 200), allergic reaction [possibly serious] (1 in 200)], benefits (diagnostic support and management of coronary artery disease) and alternatives of a cardiac catheterization were discussed in detail with Ms. Yim and she is willing to proceed.      For questions or updates, please contact [facility] Please consult [email or web address] for contact info under Cardiology/STEMI.    Signed, Eula Listen, PA-C [facility] Pager: [phone] 09/29/2022, 7:36 AM

## 2022-09-29 NOTE — Progress Notes (Signed)
[facility]  - [place] [facility]   PATIENT NAME: Courtney Wilcox    MR#:  [id number]  DATE OF BIRTH:  10-08-1945  SUBJECTIVE:  no family at bedside overall no new complaints. Planning for right heart catheterization today.    VITALS:  Blood pressure 127/72, pulse 60, temperature 98.7 F (37.1 C), temperature source Oral, resp. rate 16, height 5\' 2"  (1.575 m), weight 85.7 kg, SpO2 100%.  PHYSICAL EXAMINATION:   GENERAL:  77 y.o.-year-old patient with no acute distress.  LUNGS: Normal breath sounds bilaterally, no wheezing CARDIOVASCULAR: S1, S2 normal. No murmur   ABDOMEN: Soft, nontender, nondistended. Bowel sounds present.  EXTREMITIES: No  edema b/l.    NEUROLOGIC: nonfocal  patient is alert and awake SKIN: No obvious rash, lesion, or ulcer.   LABORATORY PANEL:  CBC Recent Labs  Lab 09/29/22 0554  WBC 8.3  HGB 10.7*  HCT 33.0*  PLT 168    Chemistries  Recent Labs  Lab 09/27/22 1806 09/28/22 1013 09/29/22 0554  NA 140   < > 139  K 3.7   < > 4.3  CL 107   < > 106  CO2 22   < > 24  GLUCOSE 99   < > 109*  BUN 28*   < > 31*  CREATININE 1.50*   < > 1.59*  CALCIUM 8.7*   < > 8.4*  MG 1.8  --   --   AST 17  --   --   ALT 15  --   --   ALKPHOS 66  --   --   BILITOT 0.6  --   --    < > = values in this interval not displayed.   Cardiac Enzymes No results for input(s): "TROPONINI" in the last 168 hours. RADIOLOGY:  ECHOCARDIOGRAM COMPLETE  Result Date: 09/29/2022    ECHOCARDIOGRAM REPORT   Patient Name:   Courtney Wilcox Date of Exam: 09/29/2022 Medical Rec #:  [id number]         Height:       62.0 in Accession #:    [id number]        Weight:       189.0 lb Date of Birth:  21-Jul-1945         BSA:          1.866 m Patient Age:    77 years          BP:           160/90 mmHg Patient Gender: F                 HR:           58 bpm. Exam Location:  Inpatient Procedure: 2D Echo, Cardiac Doppler and Color Doppler Indications:     Chest pain R07.9   History:         Patient has prior history of Echocardiogram examinations, most                  recent 11/01/2021. CAD, Carotid Disease and H/o PE; GERD; CKD                  3b, Arrythmias:Atrial Flutter; Risk Factors:Dyslipidemia and                  Non-Smoker.  Sonographer:     Dondra Prader RVT RCS Referring Phys:  [id number] Antonieta Iba Diagnosing Phys: Debbe Odea MD IMPRESSIONS  1. Left ventricular ejection  fraction, by estimation, is 55 to 60%. The left ventricle has normal function. The left ventricle has no regional wall motion abnormalities. Left ventricular diastolic parameters are consistent with Grade I diastolic dysfunction (impaired relaxation).  2. Right ventricular systolic function is normal. The right ventricular size is normal.  3. Left atrial size was mildly dilated.  4. The mitral valve is normal in structure. No evidence of mitral valve regurgitation.  5. The aortic valve is tricuspid. Aortic valve regurgitation is not visualized.  6. The inferior vena cava is normal in size with greater than 50% respiratory variability, suggesting right atrial pressure of 3 mmHg. FINDINGS  Left Ventricle: Left ventricular ejection fraction, by estimation, is 55 to 60%. The left ventricle has normal function. The left ventricle has no regional wall motion abnormalities. The left ventricular internal cavity size was normal in size. There is  no left ventricular hypertrophy. Left ventricular diastolic parameters are consistent with Grade I diastolic dysfunction (impaired relaxation). Right Ventricle: The right ventricular size is normal. No increase in right ventricular wall thickness. Right ventricular systolic function is normal. Left Atrium: Left atrial size was mildly dilated. Right Atrium: Right atrial size was normal in size. Pericardium: There is no evidence of pericardial effusion. Mitral Valve: The mitral valve is normal in structure. No evidence of mitral valve regurgitation. Tricuspid Valve: The  tricuspid valve is normal in structure. Tricuspid valve regurgitation is trivial. Aortic Valve: The aortic valve is tricuspid. Aortic valve regurgitation is not visualized. Aortic valve mean gradient measures 4.0 mmHg. Aortic valve peak gradient measures 6.2 mmHg. Aortic valve area, by VTI measures 3.34 cm. Pulmonic Valve: The pulmonic valve was normal in structure. Pulmonic valve regurgitation is not visualized. Aorta: The aortic root is normal in size and structure. Venous: The inferior vena cava is normal in size with greater than 50% respiratory variability, suggesting right atrial pressure of 3 mmHg. IAS/Shunts: No atrial level shunt detected by color flow Doppler.  LEFT VENTRICLE PLAX 2D LVIDd:         4.50 cm   Diastology LVIDs:         3.10 cm   LV e' medial:    5.18 cm/s LV PW:         1.10 cm   LV E/e' medial:  14.2 LV IVS:        1.00 cm   LV e' lateral:   7.35 cm/s LVOT diam:     2.05 cm   LV E/e' lateral: 10.0 LV SV:         101 LV SV Index:   54 LVOT Area:     3.30 cm  RIGHT VENTRICLE             IVC RV Basal diam:  3.10 cm     IVC diam: 1.20 cm RV S prime:     12.00 cm/s TAPSE (M-mode): 1.5 cm LEFT ATRIUM             Index        RIGHT ATRIUM           Index LA diam:        4.50 cm 2.41 cm/m   RA Area:     11.30 cm LA Vol (A2C):   74.4 ml 39.87 ml/m  RA Volume:   25.30 ml  13.56 ml/m LA Vol (A4C):   76.3 ml 40.88 ml/m LA Biplane Vol: 74.7 ml 40.03 ml/m  AORTIC VALVE  PULMONIC VALVE AV Area (Vmax):    3.41 cm     PV Vmax:       0.74 m/s AV Area (Vmean):   3.10 cm     PV Peak grad:  2.2 mmHg AV Area (VTI):     3.34 cm AV Vmax:           124.00 cm/s AV Vmean:          89.300 cm/s AV VTI:            0.301 m AV Peak Grad:      6.2 mmHg AV Mean Grad:      4.0 mmHg LVOT Vmax:         128.00 cm/s LVOT Vmean:        83.800 cm/s LVOT VTI:          0.305 m LVOT/AV VTI ratio: 1.01  AORTA Ao Root diam: 3.05 cm Ao Asc diam:  3.20 cm MITRAL VALVE MV Area (PHT): 3.31 cm    SHUNTS MV  Decel Time: 229 msec    Systemic VTI:  0.30 m MV E velocity: 73.40 cm/s  Systemic Diam: 2.05 cm MV A velocity: 85.40 cm/s MV E/A ratio:  0.86 Debbe Odea MD Electronically signed by Debbe Odea MD Signature Date/Time: 09/29/2022/3:38:11 PM    Final    US THYROID  Result Date: 09/28/2022 CLINICAL DATA:  Incidental on CT. EXAM: THYROID ULTRASOUND TECHNIQUE: Ultrasound examination of the thyroid gland and adjacent soft tissues was performed. COMPARISON:  CT scan of the chest 09/27/2022 FINDINGS: Parenchymal Echotexture: Moderately heterogenous Isthmus: 0.3 cm Right lobe: 3.6 x 1.5 x 1.8 cm Left lobe: 4.8 x 3.4 x 2.4 cm _________________________________________________________ Estimated total number of nodules >/= 1 cm: 2 Number of spongiform nodules >/=  2 cm not described below (TR1): 0 Number of mixed cystic and solid nodules >/= 1.5 cm not described below (TR2): 0 _________________________________________________________ Nodule # 1: Ill-defined isoechoic solid nodule with a central dystrophic calcification in the left mid gland measures 2.3 x 2.3 x 2.2 cm. Due to the presence of the calcifications, findings are consistent with TI-RADS category 4. **Given size (>/= 1.5 cm) and appearance, fine needle aspiration of this moderately suspicious nodule should be considered based on TI-RADS criteria. Nodule # 2: Ill-defined isoechoic solid nodule with internal dystrophic calcification in the left lower gland measures 2.5 x 2.01.7 cm. Findings are consistent with TI-RADS category 4. **Given size (>/= 1.5 cm) and appearance, fine needle aspiration of this moderately suspicious nodule should be considered based on TI-RADS criteria. IMPRESSION: 1. Heterogeneous and mildly enlarged thyroid gland. 2. Adjacent and abutting TI-RADS category 4 nodules in the left mid and left lower gland are noted. Both meet criteria to consider fine-needle aspiration biopsy. Non urgent biopsy is recommended. The above is in keeping  with the ACR TI-RADS recommendations - J Am Coll Radiol 2017;14:587-595. Electronically Signed   By: Malachy Moan M.D.   On: 09/28/2022 11:34   CT Angio Chest PE W and/or Wo Contrast  Result Date: 09/27/2022 CLINICAL DATA:  Shortness of breath, weakness and chest pain. EXAM: CT ANGIOGRAPHY CHEST WITH CONTRAST TECHNIQUE: Multidetector CT imaging of the chest was performed using the standard protocol during bolus administration of intravenous contrast. Multiplanar CT image reconstructions and MIPs were obtained to evaluate the vascular anatomy. RADIATION DOSE REDUCTION: This exam was performed according to the departmental dose-optimization program which includes automated exposure control, adjustment of the mA and/or kV according to patient size and/or  use of iterative reconstruction technique. CONTRAST:  60mL OMNIPAQUE IOHEXOL 350 MG/ML SOLN COMPARISON:  November 19, 2020 FINDINGS: Cardiovascular: There is mild to moderate severity calcification of the aortic arch and descending thoracic aorta, without evidence of aortic aneurysm. Satisfactory opacification of the pulmonary arteries to the segmental level. No evidence of pulmonary embolism. Normal heart size with mild coronary artery calcification. No pericardial effusion. Mediastinum/Nodes: No enlarged mediastinal, hilar, or axillary lymph nodes. Multiple heterogeneous thyroid nodules are seen within an enlarged left lobe of the thyroid gland. The trachea and esophagus demonstrate no significant findings. Lungs/Pleura: There is no evidence of an acute infiltrate, pleural effusion or pneumothorax. Upper Abdomen: There is a small hiatal hernia. Musculoskeletal: Multilevel degenerative changes seen throughout the thoracic spine. Review of the MIP images confirms the above findings. IMPRESSION: 1. No evidence of pulmonary embolism or acute cardiopulmonary disease. 2. Multiple heterogeneous thyroid nodules within an enlarged left lobe of the thyroid gland.  Further evaluation with nonemergent thyroid ultrasound is recommended. This follows ACR consensus guidelines: Managing Incidental Thyroid Nodules Detected on Imaging: White Paper of the ACR Incidental Thyroid Findings Committee. J Am Coll Radiol 2015; 12:143-150. 3. Small hiatal hernia. 4. Aortic atherosclerosis. Aortic Atherosclerosis (ICD10-I70.0). Electronically Signed   By: Aram Candela M.D.   On: 09/27/2022 20:36   DG Chest Port 1 View  Result Date: 09/27/2022 CLINICAL DATA:  Intermittent chest pain and palpitations for several months EXAM: PORTABLE CHEST 1 VIEW COMPARISON:  Chest radiograph 06/14/2017 FINDINGS: The heart is enlarged, unchanged. The upper mediastinal contours are normal There is no focal consolidation or pulmonary edema. There is no pleural effusion or pneumothorax There is no acute osseous abnormality. IMPRESSION: Cardiomegaly. No radiographic evidence of acute cardiopulmonary process. Electronically Signed   By: Lesia Hausen M.D.   On: 09/27/2022 19:37    Assessment and Plan  77yo with h/o SVT, depression, HTN, OSA, RA, and PE who presented on 9/11 with chest pain with runs of SVT, sent by cardiology.  CTA negative for PE, has multiple thyroid nodules.  She was recently diagnosed with a sinus infection and is being treated with Ceftin and prednisone.  Cardiology was concerned for unstable angina; she is likely to need cardiac catheterization on 9/13 after Eliquis washout.   Unstable angina (HCC) Presented to cardiology office with CP, noted to have runs of SVT Intermittent tachycardia with HR as high as 140-150 and improves with rest/deep breathing.  EKG shows HR of 94 and SR with frequent PAC.  CTA chest is negative, no evidence of PE or CAD Clinically improved with  O2 although not hypoxic [facility] Cardiology consulted Plan for cardiac cath on 9/13 Last dose of Eliquis was 9/12   Rheumatoid arthritis (HCC) Continue leflunomide, Plaquenil, prednisone   Anemia of  chronic disease Appears to be stable Will follow   GERD without esophagitis Continue Pepcid, Protonix   Chronic kidney disease, stage 3b (HCC) Appears to be stable at this time Attempt to avoid nephrotoxic medications Recheck BMP in AM  Given need for contrast dye tomorrow, will order gentle IVF infusion   Class 1 obesity due to excess calories with body mass index (BMI) of 34.0 to 34.9 in adult Body mass index is 34.57 kg/m.Marland Kitchen  Weight loss should be encouraged   History of pulmonary embolism Eliquis on hold pending cath 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  2. Adjacent and abutting TI-RADS category 4 nodules in the left mid and left lower gland are noted. Both meet criteria to consider fine-needle aspiration biopsy. Non urgent biopsy is recommended. Will defer to PCP but will need further evaluation.   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    TOTAL TIME TAKING CARE OF THIS PATIENT: 35 minutes.  >50% time spent on counselling and coordination of care  Note: This dictation was  prepared with Dragon dictation along with smaller phrase technology. Any transcriptional errors that result from this process are unintentional.  Enedina Finner M.D    [facility]   CC: Primary care physician; Patrice Paradise, MD

## 2022-09-30 DIAGNOSIS — I2510 Atherosclerotic heart disease of native coronary artery without angina pectoris: Secondary | ICD-10-CM | POA: Diagnosis not present

## 2022-09-30 DIAGNOSIS — I200 Unstable angina: Secondary | ICD-10-CM | POA: Diagnosis not present

## 2022-09-30 DIAGNOSIS — N1832 Chronic kidney disease, stage 3b: Secondary | ICD-10-CM | POA: Diagnosis not present

## 2022-09-30 DIAGNOSIS — K219 Gastro-esophageal reflux disease without esophagitis: Secondary | ICD-10-CM | POA: Diagnosis not present

## 2022-09-30 DIAGNOSIS — I471 Supraventricular tachycardia, unspecified: Secondary | ICD-10-CM | POA: Diagnosis not present

## 2022-09-30 DIAGNOSIS — D638 Anemia in other chronic diseases classified elsewhere: Secondary | ICD-10-CM | POA: Diagnosis not present

## 2022-09-30 NOTE — Plan of Care (Signed)
Problem: Activity: Goal: Ability to tolerate increased activity will improve Outcome: Progressing   Problem: Coping: Goal: Level of anxiety will decrease Outcome: Progressing   Problem: Pain Managment: Goal: General experience of comfort will improve Outcome: Progressing   Problem: Safety: Goal: Ability to remain free from injury will improve Outcome: Progressing

## 2022-09-30 NOTE — Progress Notes (Signed)
Progress Note  Patient Name: Courtney Wilcox Date of Encounter: 09/30/2022  Primary Cardiologist: End Primary Electrophysiologist: Lalla Brothers   Subjective   LHC on 9/13 showed severe one-vessel CAD involving the ostial/proximal LAD with recommendation for staged PCI via the left radial approach due to severe tortuosity of the right innominate artery. She was started on amiodarone in an effort to improve SVT burden prior to procedure. With this, there has been much improvement in SVT burden on tele.   Currently, without symptoms of angina or cardiac decompensation. No right radial arteriotomy site complications.   Inpatient Medications    Scheduled Meds:  amiodarone  400 mg Oral BID   aspirin  81 mg Oral Daily   atorvastatin  80 mg Oral Daily   clopidogrel  75 mg Oral Q breakfast   diltiazem  180 mg Oral Daily   enoxaparin (LOVENOX) injection  40 mg Subcutaneous Q24H   hydroxychloroquine  200 mg Oral Daily   leflunomide  10 mg Oral Daily   pantoprazole  40 mg Oral BID   predniSONE  5 mg Oral Q breakfast   Followed by   Melene Muller ON 10/01/2022] predniSONE  2.5 mg Oral Q breakfast   sodium chloride flush  3 mL Intravenous Q12H   Continuous Infusions:  sodium chloride     PRN Meds: sodium chloride, acetaminophen, ALPRAZolam, levalbuterol, metoprolol tartrate, montelukast, ondansetron (ZOFRAN) IV, sodium chloride flush, triamcinolone   Vital Signs    Vitals:   09/29/22 2047 09/29/22 2300 09/30/22 0420 09/30/22 0804  BP: (!) 154/74 (!) 151/81 (!) 165/75 (!) 147/77  Pulse: 62  62 62  Resp: 20 20  (!) 25  Temp: 98.3 F (36.8 C) 97.6 F (36.4 C) (!) 97.5 F (36.4 C) 97.6 F (36.4 C)  TempSrc: Oral Oral Oral   SpO2: 100%  100% 100%  Weight:      Height:        Intake/Output Summary (Last 24 hours) at 09/30/2022 0958 Last data filed at 09/30/2022 0300 Gross per 24 hour  Intake 450.82 ml  Output --  Net 450.82 ml   Filed Weights   09/27/22 1742 09/29/22 1632  Weight:  85.7 kg 85.7 kg    Telemetry    SR with sinus bradycardia with much improvement in SVT burden - Personally Reviewed  ECG    No new tracings - Personally Reviewed  Physical Exam   GEN: No acute distress.   Neck: No JVD. Cardiac: RRR, no murmurs, rubs, or gallops. Right radial arteriotomy site without active bleeding, swelling, warmth, erythema, bruising, or TTP. Radial pulse 2+ proximal and distal to the arteriotomy site.  Respiratory: Clear to auscultation bilaterally.  GI: Soft, nontender, non-distended.   MS: No edema; No deformity. Neuro:  Alert and oriented x 3; Nonfocal.  Psych: Normal affect.  Labs    Chemistry Recent Labs  Lab 09/27/22 1806 09/28/22 1013 09/29/22 0554  NA 140 141 139  K 3.7 4.2 4.3  CL 107 107 106  CO2 22 24 24   GLUCOSE 99 85 109*  BUN 28* 29* 31*  CREATININE 1.50* 1.52* 1.59*  CALCIUM 8.7* 8.6* 8.4*  PROT 6.0*  --   --   ALBUMIN 3.4*  --   --   AST 17  --   --   ALT 15  --   --   ALKPHOS 66  --   --   BILITOT 0.6  --   --   GFRNONAA 36* 36* 34*  ANIONGAP 11  10 9     Hematology Recent Labs  Lab 09/27/22 1806 09/28/22 1013 09/29/22 0554  WBC 9.5 8.0 8.3  RBC 3.53* 3.20* 3.20*  HGB 11.6* 10.6* 10.7*  HCT 36.6 33.2* 33.0*  MCV 103.7* 103.8* 103.1*  MCH 32.9 33.1 33.4  MCHC 31.7 31.9 32.4  RDW 13.9 14.0 13.8  PLT 198 178 168    Cardiac EnzymesNo results for input(s): "TROPONINI" in the last 168 hours. No results for input(s): "TROPIPOC" in the last 168 hours.   BNP Recent Labs  Lab 09/27/22 1806  BNP 241.2*     DDimer No results for input(s): "DDIMER" in the last 168 hours.   Radiology    US THYROID  Result Date: 09/28/2022 IMPRESSION: 1. Heterogeneous and mildly enlarged thyroid gland. 2. Adjacent and abutting TI-RADS category 4 nodules in the left mid and left lower gland are noted. Both meet criteria to consider fine-needle aspiration biopsy. Non urgent biopsy is recommended. The above is in keeping with the ACR  TI-RADS recommendations - J Am Coll Radiol 2017;14:587-595. Electronically Signed   By: Malachy Moan M.D.   On: 09/28/2022 11:34   CT Angio Chest PE W and/or Wo Contrast  Result Date: 09/27/2022 IMPRESSION: 1. No evidence of pulmonary embolism or acute cardiopulmonary disease. 2. Multiple heterogeneous thyroid nodules within an enlarged left lobe of the thyroid gland. Further evaluation with nonemergent thyroid ultrasound is recommended. This follows ACR consensus guidelines: Managing Incidental Thyroid Nodules Detected on Imaging: White Paper of the ACR Incidental Thyroid Findings Committee. J Am Coll Radiol 2015; 12:143-150. 3. Small hiatal hernia. 4. Aortic atherosclerosis. Aortic Atherosclerosis (ICD10-I70.0). Electronically Signed   By: Aram Candela M.D.   On: 09/27/2022 20:36   DG Chest Port 1 View  Result Date: 09/27/2022 IMPRESSION: Cardiomegaly. No radiographic evidence of acute cardiopulmonary process. Electronically Signed   By: Lesia Hausen M.D.   On: 09/27/2022 19:37    Cardiac Studies   LHC 09/29/2022:   Prox RCA lesion is 30% stenosed.   Prox LAD lesion is 85% stenosed.   Prox LAD to Mid LAD lesion is 50% stenosed.   1.  Severe one-vessel coronary artery disease involving the ostial/proximal LAD 2.  Left ventricular angiography was not performed.  EF was normal by echo.  Normal left ventricular end-diastolic pressure. 3.  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 4.  Frequent supraventricular tachycardia throughout the case which made angiography difficult.   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  In addition, SVT has to be well-controlled before the procedure and thus I elected to start the  patient on oral amiodarone as we are not able to increase diltiazem due to resting bradycardia. Given that the patient's pulmonary embolism was more than a year ago and she will require dual antiplatelet therapy, I favor not resuming Eliquis especially that recent CTA showed no evidence of pulmonary embolism.  I started aspirin and clopidogrel. The echo from today did not show evidence of pulmonary hypertension and thus no need to attempt right heart catheterization at this time. __________  Luci Bank patch 04/2022:   The patient was monitored for 14 days.   The predominant rhythm was sinus with an average rate of 57 bpm while in sinus rhythm (range  42-101 bpm).   There were rare PACs and PVCs.   25,709 episode of supraventricular tachycardia cardia occurred, lasting up to 40:12 minutes with a maximum rate of 222 bpm.   A single 5 beat run of nonsustained ventricular tachycardia was observed with a maximum rate of 174 bpm.   There was no prolonged pause.   Patient triggered events corresponded to sinus rhythm, SVT, and PACs.   Sinus rhythm with numerous episodes of SVT lasting up to 40 minutes, as detailed above.  Rare PACs and PVCs as well as a single brief run of NSVT also noted. __________   2D echo 10/31/2021: 1. Left ventricular ejection fraction, by estimation, is 60 to 65%. The  left ventricle has normal function. The left ventricle has no regional  wall motion abnormalities. There is mild left ventricular hypertrophy.  Left ventricular diastolic parameters  are consistent with Grade II diastolic dysfunction (pseudonormalization).   2. Right ventricular systolic function is normal. The right ventricular  size is normal.   3. Left atrial size was severely dilated.   4. The mitral valve is normal in structure. Mild mitral valve  regurgitation.   5. The aortic valve is tricuspid. Aortic valve regurgitation is not  visualized.   6. The inferior vena cava is normal in size with greater than  50%  respiratory variability, suggesting right atrial pressure of 3 mmHg.  __________   2D echo 11/20/2020: 1. Left ventricular ejection fraction, by estimation, is 45 to 50%. The  left ventricle has mildly decreased function. The left ventricle  demonstrates global hypokinesis. The left ventricular internal cavity size  was mildly to moderately dilated. There  is mild concentric left ventricular hypertrophy. Left ventricular  diastolic parameters are consistent with Grade I diastolic dysfunction  (impaired relaxation).   2. Right ventricular systolic function is mildly reduced. The right  ventricular size is normal.   3. Left atrial size was severely dilated.   4. Right atrial size was moderately dilated.   5. The mitral valve is grossly normal. Mild to moderate mitral valve  regurgitation.   6. Tricuspid valve regurgitation is mild to moderate.   7. The aortic valve is calcified. Aortic valve regurgitation is not  visualized. Mild aortic valve sclerosis is present, with no evidence of  aortic valve stenosis.   Patient Profile     77 y.o. 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 Allena Katz.   Assessment & Plan    1. Coronary artery calcification with unstable angina: -Currently without symptoms of angina -Ruled out -[facility] 9/13 with severe one-vessel CAD involving the ostial/proximal LAD with recommendation for staged PCI via the left radial approach due to severe tortuosity of the right innominate artery -Scheduled for 0730 case on 9/16 -NPO at midnight heading into 9/16 -ASA/Plavix -Lipitor  -Post cath instructions -Cardiac rehab    2.  Paroxysmal atrial tachycardia/SVT: -Likely exacerbating  her underlying chronic dyspnea -Prior outpatient cardiac monitoring showed significant SVT burden with 25,709 episodes lasting up to 40 minutes and 12 seconds -Management of her SVT has been somewhat limited given underlying pulmonary disease and medication interactions with Celexa -Earlier this admission, she continued to have very frequent, short episodes of SVT on tele  -Much  improved SVT burden following addition of amiodarone, would continue per typical loading protocol of 400 mg bid x 7 days (9/21), 200 mg bid x 7 days (9/28), then 200 mg daily thereafter (10/5) -Recommend continuing PTA Cardizem CD 180 mg daily, baseline sinus rate precludes titration at this time -Previously on beta-blocker without good symptomatic control -Recommend revisiting with EP this admission    3.  Chronic dyspnea with asthma and history of PE: -She was diagnosed with a PE in 11/2020 with persistent dyspnea since -Has been maintained on apixaban 5 mg twice daily since, though has only been taking apixaban once daily -Discontinued this admission given her PE was > 1 year ago with recent CTA showing no evidence of PE   4.  Macrocytic anemia: -Stable -Followed by hematology   5.  Multiple thyroid nodules: -Status post ultrasound recommendation for nonemergent biopsy -Will need outpatient follow-up with PCP   Informed Consent   Shared Decision Making/Informed Consent{  The risks [stroke (1 in 1000), death (1 in 1000), kidney failure [usually temporary] (1 in 500), bleeding (1 in 200), allergic reaction [possibly serious] (1 in 200)], benefits (diagnostic support and management of coronary artery disease) and alternatives of a cardiac catheterization were discussed in detail with Ms. Callens and she is willing to proceed.      For questions or updates, please contact [facility] Please consult [email or web address] for contact info under Cardiology/STEMI.    Signed, Eula Listen, PA-C [facility] Pager:  [phone] 09/30/2022, 9:58 AM

## 2022-09-30 NOTE — Progress Notes (Signed)
[facility]  - [place] [facility]   PATIENT NAME: Courtney Wilcox    MR#:  [id number]  DATE OF BIRTH:  1945-04-30  SUBJECTIVE:  no family at bedside overall no new complaints. Complains of having light headache. Sitting on the recliner chair eating lunch continues with intermittent dysrhythmia  VITALS:  Blood pressure 121/81, pulse 71, temperature 97.8 F (36.6 C), resp. rate 18, height 5\' 2"  (1.575 m), weight 85.7 kg, SpO2 100%.  PHYSICAL EXAMINATION:   GENERAL:  77 y.o.-year-old patient with no acute distress. Obese LUNGS: Normal breath sounds bilaterally, no wheezing CARDIOVASCULAR: S1, S2 normal. No murmur   ABDOMEN: Soft, nontender, nondistended. Bowel sounds present.  EXTREMITIES: No  edema b/l.    NEUROLOGIC: nonfocal  patient is alert and awake  LABORATORY PANEL:  CBC Recent Labs  Lab 09/29/22 0554  WBC 8.3  HGB 10.7*  HCT 33.0*  PLT 168    Chemistries  Recent Labs  Lab 09/27/22 1806 09/28/22 1013 09/29/22 0554  NA 140   < > 139  K 3.7   < > 4.3  CL 107   < > 106  CO2 22   < > 24  GLUCOSE 99   < > 109*  BUN 28*   < > 31*  CREATININE 1.50*   < > 1.59*  CALCIUM 8.7*   < > 8.4*  MG 1.8  --   --   AST 17  --   --   ALT 15  --   --   ALKPHOS 66  --   --   BILITOT 0.6  --   --    < > = values in this interval not displayed.   Cardiac Enzymes No results for input(s): "TROPONINI" in the last 168 hours. RADIOLOGY:  CARDIAC CATHETERIZATION  Result Date: 09/29/2022   Prox RCA lesion is 30% stenosed.   Prox LAD lesion is 85% stenosed.   Prox LAD to Mid LAD lesion is 50% stenosed. 1.  Severe one-vessel coronary artery disease involving the ostial/proximal LAD 2.  Left ventricular angiography was not performed.  EF was normal by echo.  Normal left ventricular end-diastolic pressure. 3.  Right heart catheterization was attempted but aborted due to severe disease involving the right brachial vein and inability to advance a 5 [place] Swan-Ganz  catheter in spite of being able to advance an 014 wire. 4.  Frequent supraventricular tachycardia throughout the case which made angiography difficult. 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  In addition, SVT has to be well-controlled before the procedure and thus I elected to start the patient on oral amiodarone as we are not able to increase diltiazem due to resting bradycardia. Given that the patient's pulmonary embolism was more than a year ago and she will require dual antiplatelet therapy, I favor not resuming Eliquis especially that recent CTA showed no evidence of pulmonary embolism.  I started aspirin and clopidogrel. The echo from today did not show evidence of pulmonary hypertension and thus no need to attempt right heart catheterization at this time.   ECHOCARDIOGRAM COMPLETE  Result Date: 09/29/2022    ECHOCARDIOGRAM REPORT   Patient Name:   Courtney Wilcox Date of Exam: 09/29/2022 Medical Rec #:  [id number]         Height:       62.0 in Accession #:    [id number]  Weight:       189.0 lb Date of Birth:  Nov 30, 1945         BSA:          1.866 m Patient Age:    77 years          BP:           160/90 mmHg Patient Gender: F                 HR:           58 bpm. Exam Location:  Inpatient Procedure: 2D Echo, Cardiac Doppler and Color Doppler Indications:     Chest pain R07.9  History:         Patient has prior history of Echocardiogram examinations, most                  recent 11/01/2021. CAD, Carotid Disease and H/o PE; GERD; CKD                  3b, Arrythmias:Atrial Flutter; Risk Factors:Dyslipidemia and                  Non-Smoker.  Sonographer:     Dondra Prader RVT RCS Referring Phys:  [id number] Antonieta Iba Diagnosing Phys: Debbe Odea MD IMPRESSIONS  1. Left ventricular ejection fraction, by estimation, is 55 to 60%. The left ventricle has normal function. The left ventricle has no  regional wall motion abnormalities. Left ventricular diastolic parameters are consistent with Grade I diastolic dysfunction (impaired relaxation).  2. Right ventricular systolic function is normal. The right ventricular size is normal.  3. Left atrial size was mildly dilated.  4. The mitral valve is normal in structure. No evidence of mitral valve regurgitation.  5. The aortic valve is tricuspid. Aortic valve regurgitation is not visualized.  6. The inferior vena cava is normal in size with greater than 50% respiratory variability, suggesting right atrial pressure of 3 mmHg. FINDINGS  Left Ventricle: Left ventricular ejection fraction, by estimation, is 55 to 60%. The left ventricle has normal function. The left ventricle has no regional wall motion abnormalities. The left ventricular internal cavity size was normal in size. There is  no left ventricular hypertrophy. Left ventricular diastolic parameters are consistent with Grade I diastolic dysfunction (impaired relaxation). Right Ventricle: The right ventricular size is normal. No increase in right ventricular wall thickness. Right ventricular systolic function is normal. Left Atrium: Left atrial size was mildly dilated. Right Atrium: Right atrial size was normal in size. Pericardium: There is no evidence of pericardial effusion. Mitral Valve: The mitral valve is normal in structure. No evidence of mitral valve regurgitation. Tricuspid Valve: The tricuspid valve is normal in structure. Tricuspid valve regurgitation is trivial. Aortic Valve: The aortic valve is tricuspid. Aortic valve regurgitation is not visualized. Aortic valve mean gradient measures 4.0 mmHg. Aortic valve peak gradient measures 6.2 mmHg. Aortic valve area, by VTI measures 3.34 cm. Pulmonic Valve: The pulmonic valve was normal in structure. Pulmonic valve regurgitation is not visualized. Aorta: The aortic root is normal in size and structure. Venous: The inferior vena cava is normal in size with  greater than 50% respiratory variability, suggesting right atrial pressure of 3 mmHg. IAS/Shunts: No atrial level shunt detected by color flow Doppler.  LEFT VENTRICLE PLAX 2D LVIDd:         4.50 cm   Diastology LVIDs:         3.10 cm   LV e' medial:  Weight:       189.0 lb Date of Birth:  Nov 30, 1945         BSA:          1.866 m Patient Age:    77 years          BP:           160/90 mmHg Patient Gender: F                 HR:           58 bpm. Exam Location:  Inpatient Procedure: 2D Echo, Cardiac Doppler and Color Doppler Indications:     Chest pain R07.9  History:         Patient has prior history of Echocardiogram examinations, most                  recent 11/01/2021. CAD, Carotid Disease and H/o PE; GERD; CKD                  3b, Arrythmias:Atrial Flutter; Risk Factors:Dyslipidemia and                  Non-Smoker.  Sonographer:     Dondra Prader RVT RCS Referring Phys:  [id number] Antonieta Iba Diagnosing Phys: Debbe Odea MD IMPRESSIONS  1. Left ventricular ejection fraction, by estimation, is 55 to 60%. The left ventricle has normal function. The left ventricle has no  regional wall motion abnormalities. Left ventricular diastolic parameters are consistent with Grade I diastolic dysfunction (impaired relaxation).  2. Right ventricular systolic function is normal. The right ventricular size is normal.  3. Left atrial size was mildly dilated.  4. The mitral valve is normal in structure. No evidence of mitral valve regurgitation.  5. The aortic valve is tricuspid. Aortic valve regurgitation is not visualized.  6. The inferior vena cava is normal in size with greater than 50% respiratory variability, suggesting right atrial pressure of 3 mmHg. FINDINGS  Left Ventricle: Left ventricular ejection fraction, by estimation, is 55 to 60%. The left ventricle has normal function. The left ventricle has no regional wall motion abnormalities. The left ventricular internal cavity size was normal in size. There is  no left ventricular hypertrophy. Left ventricular diastolic parameters are consistent with Grade I diastolic dysfunction (impaired relaxation). Right Ventricle: The right ventricular size is normal. No increase in right ventricular wall thickness. Right ventricular systolic function is normal. Left Atrium: Left atrial size was mildly dilated. Right Atrium: Right atrial size was normal in size. Pericardium: There is no evidence of pericardial effusion. Mitral Valve: The mitral valve is normal in structure. No evidence of mitral valve regurgitation. Tricuspid Valve: The tricuspid valve is normal in structure. Tricuspid valve regurgitation is trivial. Aortic Valve: The aortic valve is tricuspid. Aortic valve regurgitation is not visualized. Aortic valve mean gradient measures 4.0 mmHg. Aortic valve peak gradient measures 6.2 mmHg. Aortic valve area, by VTI measures 3.34 cm. Pulmonic Valve: The pulmonic valve was normal in structure. Pulmonic valve regurgitation is not visualized. Aorta: The aortic root is normal in size and structure. Venous: The inferior vena cava is normal in size with  greater than 50% respiratory variability, suggesting right atrial pressure of 3 mmHg. IAS/Shunts: No atrial level shunt detected by color flow Doppler.  LEFT VENTRICLE PLAX 2D LVIDd:         4.50 cm   Diastology LVIDs:         3.10 cm   LV e' medial:  Weight:       189.0 lb Date of Birth:  Nov 30, 1945         BSA:          1.866 m Patient Age:    77 years          BP:           160/90 mmHg Patient Gender: F                 HR:           58 bpm. Exam Location:  Inpatient Procedure: 2D Echo, Cardiac Doppler and Color Doppler Indications:     Chest pain R07.9  History:         Patient has prior history of Echocardiogram examinations, most                  recent 11/01/2021. CAD, Carotid Disease and H/o PE; GERD; CKD                  3b, Arrythmias:Atrial Flutter; Risk Factors:Dyslipidemia and                  Non-Smoker.  Sonographer:     Dondra Prader RVT RCS Referring Phys:  [id number] Antonieta Iba Diagnosing Phys: Debbe Odea MD IMPRESSIONS  1. Left ventricular ejection fraction, by estimation, is 55 to 60%. The left ventricle has normal function. The left ventricle has no  regional wall motion abnormalities. Left ventricular diastolic parameters are consistent with Grade I diastolic dysfunction (impaired relaxation).  2. Right ventricular systolic function is normal. The right ventricular size is normal.  3. Left atrial size was mildly dilated.  4. The mitral valve is normal in structure. No evidence of mitral valve regurgitation.  5. The aortic valve is tricuspid. Aortic valve regurgitation is not visualized.  6. The inferior vena cava is normal in size with greater than 50% respiratory variability, suggesting right atrial pressure of 3 mmHg. FINDINGS  Left Ventricle: Left ventricular ejection fraction, by estimation, is 55 to 60%. The left ventricle has normal function. The left ventricle has no regional wall motion abnormalities. The left ventricular internal cavity size was normal in size. There is  no left ventricular hypertrophy. Left ventricular diastolic parameters are consistent with Grade I diastolic dysfunction (impaired relaxation). Right Ventricle: The right ventricular size is normal. No increase in right ventricular wall thickness. Right ventricular systolic function is normal. Left Atrium: Left atrial size was mildly dilated. Right Atrium: Right atrial size was normal in size. Pericardium: There is no evidence of pericardial effusion. Mitral Valve: The mitral valve is normal in structure. No evidence of mitral valve regurgitation. Tricuspid Valve: The tricuspid valve is normal in structure. Tricuspid valve regurgitation is trivial. Aortic Valve: The aortic valve is tricuspid. Aortic valve regurgitation is not visualized. Aortic valve mean gradient measures 4.0 mmHg. Aortic valve peak gradient measures 6.2 mmHg. Aortic valve area, by VTI measures 3.34 cm. Pulmonic Valve: The pulmonic valve was normal in structure. Pulmonic valve regurgitation is not visualized. Aorta: The aortic root is normal in size and structure. Venous: The inferior vena cava is normal in size with  greater than 50% respiratory variability, suggesting right atrial pressure of 3 mmHg. IAS/Shunts: No atrial level shunt detected by color flow Doppler.  LEFT VENTRICLE PLAX 2D LVIDd:         4.50 cm   Diastology LVIDs:         3.10 cm   LV e' medial:

## 2022-10-01 DIAGNOSIS — I200 Unstable angina: Secondary | ICD-10-CM | POA: Diagnosis not present

## 2022-10-01 DIAGNOSIS — N1832 Chronic kidney disease, stage 3b: Secondary | ICD-10-CM | POA: Diagnosis not present

## 2022-10-01 DIAGNOSIS — I2510 Atherosclerotic heart disease of native coronary artery without angina pectoris: Secondary | ICD-10-CM | POA: Diagnosis not present

## 2022-10-01 DIAGNOSIS — I471 Supraventricular tachycardia, unspecified: Secondary | ICD-10-CM | POA: Diagnosis not present

## 2022-10-01 DIAGNOSIS — D638 Anemia in other chronic diseases classified elsewhere: Secondary | ICD-10-CM | POA: Diagnosis not present

## 2022-10-01 DIAGNOSIS — K219 Gastro-esophageal reflux disease without esophagitis: Secondary | ICD-10-CM | POA: Diagnosis not present

## 2022-10-01 LAB — BASIC METABOLIC PANEL
Anion gap: 10 (ref 5–15)
BUN: 26 mg/dL — ABNORMAL HIGH (ref 8–23)
CO2: 22 mmol/L (ref 22–32)
Calcium: 8.3 mg/dL — ABNORMAL LOW (ref 8.9–10.3)
Chloride: 106 mmol/L (ref 98–111)
Creatinine, Ser: 1.66 mg/dL — ABNORMAL HIGH (ref 0.44–1.00)
GFR, Estimated: 32 mL/min — ABNORMAL LOW (ref >60)
Glucose, Bld: 81 mg/dL (ref 70–99)
Potassium: 3.8 mmol/L (ref 3.5–5.1)
Sodium: 138 mmol/L (ref 135–145)

## 2022-10-01 NOTE — Progress Notes (Signed)
[facility]  - [place] at [facility]   PATIENT NAME: Courtney Wilcox    MR#:  [id number]  DATE OF BIRTH:  1945-01-20  SUBJECTIVE:  no family at bedside overall no new complaints.   Sitting on the recliner chair eating lunch continues with intermittent dysrhythmia  VITALS:  Blood pressure (!) 146/76, pulse 72, temperature 97.7 F (36.5 C), temperature source Oral, resp. rate 16, height 5\' 2"  (1.575 m), weight 85.7 kg, SpO2 96%.  PHYSICAL EXAMINATION:   GENERAL:  77 y.o.-year-old patient with no acute distress. Obese LUNGS: Normal breath sounds bilaterally, no wheezing CARDIOVASCULAR: S1, S2 normal. No murmur   ABDOMEN: Soft, nontender, nondistended. Bowel sounds present.  EXTREMITIES: No  edema b/l.    NEUROLOGIC: nonfocal  patient is alert and awake  LABORATORY PANEL:  CBC Recent Labs  Lab 09/29/22 0554  WBC 8.3  HGB 10.7*  HCT 33.0*  PLT 168    Chemistries  Recent Labs  Lab 09/27/22 1806 09/28/22 1013 10/01/22 1157  NA 140   < > 138  K 3.7   < > 3.8  CL 107   < > 106  CO2 22   < > 22  GLUCOSE 99   < > 81  BUN 28*   < > 26*  CREATININE 1.50*   < > 1.66*  CALCIUM 8.7*   < > 8.3*  MG 1.8  --   --   AST 17  --   --   ALT 15  --   --   ALKPHOS 66  --   --   BILITOT 0.6  --   --    < > = values in this interval not displayed.   Cardiac Enzymes No results for input(s): "TROPONINI" in the last 168 hours. RADIOLOGY:  CARDIAC CATHETERIZATION  Result Date: 09/29/2022   Prox RCA lesion is 30% stenosed.   Prox LAD lesion is 85% stenosed.   Prox LAD to Mid LAD lesion is 50% stenosed. 1.  Severe one-vessel coronary artery disease involving the ostial/proximal LAD 2.  Left ventricular angiography was not performed.  EF was normal by echo.  Normal left ventricular end-diastolic pressure. 3.  Right heart catheterization was attempted but aborted due to severe disease involving the right brachial vein and inability to advance a 5 [place] Swan-Ganz catheter  in spite of being able to advance an 014 wire. 4.  Frequent supraventricular tachycardia throughout the case which made angiography difficult. 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  In addition, SVT has to be well-controlled before the procedure and thus I elected to start the patient on oral amiodarone as we are not able to increase diltiazem due to resting bradycardia. Given that the patient's pulmonary embolism was more than a year ago and she will require dual antiplatelet therapy, I favor not resuming Eliquis especially that recent CTA showed no evidence of pulmonary embolism.  I started aspirin and clopidogrel. The echo from today did not show evidence of pulmonary hypertension and thus no need to attempt right heart catheterization at this time.   ECHOCARDIOGRAM COMPLETE  Result Date: 09/29/2022    ECHOCARDIOGRAM REPORT   Patient Name:   Courtney Wilcox Date of Exam: 09/29/2022 Medical Rec #:  [id number]         Height:       62.0 in Accession #:    [id number]  Weight:       189.0 lb Date of Birth:  May 06, 1945         BSA:          1.866 m Patient Age:    77 years          BP:           160/90 mmHg Patient Gender: F                 HR:           58 bpm. Exam Location:  Inpatient Procedure: 2D Echo, Cardiac Doppler and Color Doppler Indications:     Chest pain R07.9  History:         Patient has prior history of Echocardiogram examinations, most                  recent 11/01/2021. CAD, Carotid Disease and H/o PE; GERD; CKD                  3b, Arrythmias:Atrial Flutter; Risk Factors:Dyslipidemia and                  Non-Smoker.  Sonographer:     Dondra Prader RVT RCS Referring Phys:  [id number] Antonieta Iba Diagnosing Phys: Debbe Odea MD IMPRESSIONS  1. Left ventricular ejection fraction, by estimation, is 55 to 60%. The left ventricle has normal function. The left ventricle has no regional  wall motion abnormalities. Left ventricular diastolic parameters are consistent with Grade I diastolic dysfunction (impaired relaxation).  2. Right ventricular systolic function is normal. The right ventricular size is normal.  3. Left atrial size was mildly dilated.  4. The mitral valve is normal in structure. No evidence of mitral valve regurgitation.  5. The aortic valve is tricuspid. Aortic valve regurgitation is not visualized.  6. The inferior vena cava is normal in size with greater than 50% respiratory variability, suggesting right atrial pressure of 3 mmHg. FINDINGS  Left Ventricle: Left ventricular ejection fraction, by estimation, is 55 to 60%. The left ventricle has normal function. The left ventricle has no regional wall motion abnormalities. The left ventricular internal cavity size was normal in size. There is  no left ventricular hypertrophy. Left ventricular diastolic parameters are consistent with Grade I diastolic dysfunction (impaired relaxation). Right Ventricle: The right ventricular size is normal. No increase in right ventricular wall thickness. Right ventricular systolic function is normal. Left Atrium: Left atrial size was mildly dilated. Right Atrium: Right atrial size was normal in size. Pericardium: There is no evidence of pericardial effusion. Mitral Valve: The mitral valve is normal in structure. No evidence of mitral valve regurgitation. Tricuspid Valve: The tricuspid valve is normal in structure. Tricuspid valve regurgitation is trivial. Aortic Valve: The aortic valve is tricuspid. Aortic valve regurgitation is not visualized. Aortic valve mean gradient measures 4.0 mmHg. Aortic valve peak gradient measures 6.2 mmHg. Aortic valve area, by VTI measures 3.34 cm. Pulmonic Valve: The pulmonic valve was normal in structure. Pulmonic valve regurgitation is not visualized. Aorta: The aortic root is normal in size and structure. Venous: The inferior vena cava is normal in size with greater  than 50% respiratory variability, suggesting right atrial pressure of 3 mmHg. IAS/Shunts: No atrial level shunt detected by color flow Doppler.  LEFT VENTRICLE PLAX 2D LVIDd:         4.50 cm   Diastology LVIDs:         3.10 cm   LV e' medial:  5.18 cm/s LV PW:         1.10 cm   LV E/e' medial:  14.2 LV IVS:        1.00 cm   LV e' lateral:   7.35 cm/s LVOT diam:     2.05 cm   LV E/e' lateral: 10.0 LV SV:         101 LV SV Index:   54 LVOT Area:     3.30 cm  RIGHT VENTRICLE             IVC RV Basal diam:  3.10 cm     IVC diam: 1.20 cm RV S prime:     12.00 cm/s TAPSE (M-mode): 1.5 cm LEFT ATRIUM             Index        RIGHT ATRIUM           Index LA diam:        4.50 cm 2.41 cm/m   RA Area:     11.30 cm LA Vol (A2C):   74.4 ml 39.87 ml/m  RA Volume:   25.30 ml  13.56 ml/m LA Vol (A4C):   76.3 ml 40.88 ml/m LA Biplane Vol: 74.7 ml 40.03 ml/m  AORTIC VALVE                    PULMONIC VALVE AV Area (Vmax):    3.41 cm     PV Vmax:       0.74 m/s AV Area (Vmean):   3.10 cm     PV Peak grad:  2.2 mmHg AV Area (VTI):     3.34 cm AV Vmax:           124.00 cm/s AV Vmean:          89.300 cm/s AV VTI:            0.301 m AV Peak Grad:      6.2 mmHg AV Mean Grad:      4.0 mmHg LVOT Vmax:         128.00 cm/s LVOT Vmean:        83.800 cm/s LVOT VTI:          0.305 m LVOT/AV VTI ratio: 1.01  AORTA Ao Root diam: 3.05 cm Ao Asc diam:  3.20 cm MITRAL VALVE MV Area (PHT): 3.31 cm    SHUNTS MV Decel Time: 229 msec    Systemic VTI:  0.30 m MV E velocity: 73.40 cm/s  Systemic Diam: 2.05 cm MV A velocity: 85.40 cm/s MV E/A ratio:  0.86 Debbe Odea MD Electronically signed by Debbe Odea MD Signature Date/Time: 09/29/2022/3:38:11 PM    Final     Assessment and Plan  77yo with h/o SVT, depression, HTN, OSA, RA, and PE who presented on 9/11 with chest pain with runs of SVT, sent by cardiology.  CTA negative for PE, has multiple thyroid nodules.  She was recently diagnosed with a sinus infection and is being treated  with Ceftin and prednisone.  Cardiology was concerned for unstable angina; she is likely to need cardiac catheterization on 9/13 after Eliquis washout.   Unstable angina (HCC) Presented to cardiology office with CP, noted to have runs of SVT Intermittent tachycardia with HR as high as 140-150 and improves with rest/deep breathing.  EKG shows HR of 94 and SR with frequent PAC.  CTA chest is negative, no evidence of PE or CAD Clinically improved with [place] O2 although  not hypoxic [facility] Cardiology consulted S/p  cardiac cath on 9/13--LHC 09/29/2022:   Prox RCA lesion is 30% stenosed.   Prox LAD lesion is 85% stenosed.   Prox LAD to Mid LAD lesion is 50% stenosed. -- Recommends stage LAD PCI on Monday via left radial approach per Dr. Kirke Corin -- eliquis discontinued by cardiology  -- patient now on aspirin and Plavix  Rheumatoid arthritis (HCC) Continue leflunomide, Plaquenil, prednisone   Anemia of chronic disease Appears to be stable   GERD without esophagitis Continue Pepcid, Protonix   Chronic kidney disease, stage 3b (HCC) Appears to be stable at this time  avoid nephrotoxic medications   Class 1 obesity due to excess calories with body mass index (BMI) of 34.0 to 34.9 in adult Body mass index is 34.57 kg/m.Marland Kitchen  Weight loss should be encouraged   History of pulmonary embolism Per Dr Kirke Corin " Given that the patient's pulmonary embolism was more than a year ago and she will require dual antiplatelet therapy, I favor not resuming Eliquis especially that recent CTA showed no evidence of pulmonary embolism. I started aspirin and clopidogrel. "  History of atrial flutter PSVT (paroxysmal supraventricular tachycardia) Cardiology is recommending Amiodarone Cont diltiazem   Multiple thyroid nodules Thyroid US to evaluate as thyroid had multiple heterogenous thyroid nodules Normal TSH Thyroid US:  Heterogeneous and mildly enlarged thyroid gland.  2. Adjacent and abutting TI-RADS category 4  nodules in the left mid and left lower gland are noted. Both meet criteria to consider fine-needle aspiration biopsy. Non urgent biopsy is recommended. Will defer to PCP but will need further evaluation.   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  >50% time spent on counselling and coordination of care  Note: This dictation was prepared with Dragon dictation along with smaller phrase technology. Any transcriptional errors that result from this process are unintentional.  Enedina Finner M.D    [facility]   CC: Primary care physician; Patrice Paradise, MD

## 2022-10-01 NOTE — H&P (View-Only) (Signed)
Progress Note  Patient Name: Courtney Wilcox Date of Encounter: 10/01/2022  Primary Cardiologist: End Primary Electrophysiologist: Lalla Brothers   Subjective   Had a couple episodes of left flank pain overnight that lasted ~ 15-20 minutes. Some flank discomfort currently noted. No right radial arteriotomy site complications.   Inpatient Medications    Scheduled Meds:  amiodarone  400 mg Oral BID   aspirin  81 mg Oral Daily   atorvastatin  80 mg Oral Daily   clopidogrel  75 mg Oral Q breakfast   diltiazem  180 mg Oral Daily   enoxaparin (LOVENOX) injection  40 mg Subcutaneous Q24H   hydroxychloroquine  200 mg Oral Daily   leflunomide  10 mg Oral Daily   pantoprazole  40 mg Oral BID   predniSONE  2.5 mg Oral Q breakfast   sodium chloride flush  3 mL Intravenous Q12H   Continuous Infusions:  sodium chloride     PRN Meds: sodium chloride, acetaminophen, ALPRAZolam, levalbuterol, metoprolol tartrate, montelukast, ondansetron (ZOFRAN) IV, sodium chloride flush, triamcinolone   Vital Signs    Vitals:   09/30/22 1321 09/30/22 2006 09/30/22 2351 10/01/22 0430  BP: 121/81 121/82 137/78 124/77  Pulse: 71 92 73 68  Resp: 18 16 16 19   Temp: 97.8 F (36.6 C) (!) 97.5 F (36.4 C) 98.6 F (37 C) 98.4 F (36.9 C)  TempSrc:  Oral Oral Oral  SpO2: 100%  98% 100%  Weight:      Height:        Intake/Output Summary (Last 24 hours) at 10/01/2022 0734 Last data filed at 09/30/2022 0959 Gross per 24 hour  Intake 10 ml  Output --  Net 10 ml   Filed Weights   09/27/22 1742 09/29/22 1632  Weight: 85.7 kg 85.7 kg    Telemetry    SR with recurrence of multiple episodes of SVT and a 6 beat run of NSVT - Personally Reviewed  ECG    No new tracings - Personally Reviewed  Physical Exam   GEN: No acute distress.   Neck: No JVD. Cardiac: RRR, no murmurs, rubs, or gallops. Right radial arteriotomy site without active bleeding, swelling, warmth, erythema, bruising, or TTP. Radial  pulse 2+ proximal and distal to the arteriotomy site.  Respiratory: Clear to auscultation bilaterally.  GI: Soft, nontender, non-distended.   MS: No edema; No deformity. Neuro:  Alert and oriented x 3; Nonfocal.  Psych: Normal affect.  Labs    Chemistry Recent Labs  Lab 09/27/22 1806 09/28/22 1013 09/29/22 0554  NA 140 141 139  K 3.7 4.2 4.3  CL 107 107 106  CO2 22 24 24   GLUCOSE 99 85 109*  BUN 28* 29* 31*  CREATININE 1.50* 1.52* 1.59*  CALCIUM 8.7* 8.6* 8.4*  PROT 6.0*  --   --   ALBUMIN 3.4*  --   --   AST 17  --   --   ALT 15  --   --   ALKPHOS 66  --   --   BILITOT 0.6  --   --   GFRNONAA 36* 36* 34*  ANIONGAP 11 10 9      Hematology Recent Labs  Lab 09/27/22 1806 09/28/22 1013 09/29/22 0554  WBC 9.5 8.0 8.3  RBC 3.53* 3.20* 3.20*  HGB 11.6* 10.6* 10.7*  HCT 36.6 33.2* 33.0*  MCV 103.7* 103.8* 103.1*  MCH 32.9 33.1 33.4  MCHC 31.7 31.9 32.4  RDW 13.9 14.0 13.8  PLT 198 178 168  Cardiac EnzymesNo results for input(s): "TROPONINI" in the last 168 hours. No results for input(s): "TROPIPOC" in the last 168 hours.   BNP Recent Labs  Lab 09/27/22 1806  BNP 241.2*     DDimer No results for input(s): "DDIMER" in the last 168 hours.   Radiology    US THYROID  Result Date: 09/28/2022 IMPRESSION: 1. Heterogeneous and mildly enlarged thyroid gland. 2. Adjacent and abutting TI-RADS category 4 nodules in the left mid and left lower gland are noted. Both meet criteria to consider fine-needle aspiration biopsy. Non urgent biopsy is recommended. The above is in keeping with the ACR TI-RADS recommendations - J Am Coll Radiol 2017;14:587-595. Electronically Signed   By: Malachy Moan M.D.   On: 09/28/2022 11:34   CT Angio Chest PE W and/or Wo Contrast  Result Date: 09/27/2022 IMPRESSION: 1. No evidence of pulmonary embolism or acute cardiopulmonary disease. 2. Multiple heterogeneous thyroid nodules within an enlarged left lobe of the thyroid gland. Further  evaluation with nonemergent thyroid ultrasound is recommended. This follows ACR consensus guidelines: Managing Incidental Thyroid Nodules Detected on Imaging: White Paper of the ACR Incidental Thyroid Findings Committee. J Am Coll Radiol 2015; 12:143-150. 3. Small hiatal hernia. 4. Aortic atherosclerosis. Aortic Atherosclerosis (ICD10-I70.0). Electronically Signed   By: Aram Candela M.D.   On: 09/27/2022 20:36   DG Chest Port 1 View  Result Date: 09/27/2022 IMPRESSION: Cardiomegaly. No radiographic evidence of acute cardiopulmonary process. Electronically Signed   By: Lesia Hausen M.D.   On: 09/27/2022 19:37    Cardiac Studies   LHC 09/29/2022:   Prox RCA lesion is 30% stenosed.   Prox LAD lesion is 85% stenosed.   Prox LAD to Mid LAD lesion is 50% stenosed.   1.  Severe one-vessel coronary artery disease involving the ostial/proximal LAD 2.  Left ventricular angiography was not performed.  EF was normal by echo.  Normal left ventricular end-diastolic pressure. 3.  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 4.  Frequent supraventricular tachycardia throughout the case which made angiography difficult.   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  In addition, SVT has to be well-controlled before the procedure and thus I elected to start the patient on oral amiodarone as we are not able to increase diltiazem due to resting bradycardia. Given that the patient's pulmonary embolism was more than a year ago and she will require dual antiplatelet therapy, I favor not resuming Eliquis especially that recent CTA showed no evidence of pulmonary embolism.  I started aspirin and clopidogrel. The echo from today did not show evidence of  pulmonary hypertension and thus no need to attempt right heart catheterization at this time. __________  Luci Bank patch 04/2022:   The patient was monitored for 14 days.   The predominant rhythm was sinus with an average rate of 57 bpm while in sinus rhythm (range 42-101 bpm).   There were rare PACs and PVCs.   25,709 episode of supraventricular tachycardia cardia occurred, lasting up to 40:12 minutes with a maximum rate of 222 bpm.   A single 5 beat run of nonsustained ventricular tachycardia was observed with a maximum rate of 174 bpm.   There was no prolonged pause.   Patient triggered events corresponded to  sinus rhythm, SVT, and PACs.   Sinus rhythm with numerous episodes of SVT lasting up to 40 minutes, as detailed above.  Rare PACs and PVCs as well as a single brief run of NSVT also noted. __________   2D echo 10/31/2021: 1. Left ventricular ejection fraction, by estimation, is 60 to 65%. The  left ventricle has normal function. The left ventricle has no regional  wall motion abnormalities. There is mild left ventricular hypertrophy.  Left ventricular diastolic parameters  are consistent with Grade II diastolic dysfunction (pseudonormalization).   2. Right ventricular systolic function is normal. The right ventricular  size is normal.   3. Left atrial size was severely dilated.   4. The mitral valve is normal in structure. Mild mitral valve  regurgitation.   5. The aortic valve is tricuspid. Aortic valve regurgitation is not  visualized.   6. The inferior vena cava is normal in size with greater than 50%  respiratory variability, suggesting right atrial pressure of 3 mmHg.  __________   2D echo 11/20/2020: 1. Left ventricular ejection fraction, by estimation, is 45 to 50%. The  left ventricle has mildly decreased function. The left ventricle  demonstrates global hypokinesis. The left ventricular internal cavity size  was mildly to moderately dilated. There  is mild concentric  left ventricular hypertrophy. Left ventricular  diastolic parameters are consistent with Grade I diastolic dysfunction  (impaired relaxation).   2. Right ventricular systolic function is mildly reduced. The right  ventricular size is normal.   3. Left atrial size was severely dilated.   4. Right atrial size was moderately dilated.   5. The mitral valve is grossly normal. Mild to moderate mitral valve  regurgitation.   6. Tricuspid valve regurgitation is mild to moderate.   7. The aortic valve is calcified. Aortic valve regurgitation is not  visualized. Mild aortic valve sclerosis is present, with no evidence of  aortic valve stenosis.   Patient Profile     77 y.o. 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 Allena Katz.   Assessment & Plan    1. Coronary artery calcification with unstable angina: -Currently without symptoms of angina -Ruled out -[facility] 9/13 with severe one-vessel CAD involving the ostial/proximal LAD with recommendation for staged PCI via the left radial approach due to severe tortuosity of the right innominate artery -Scheduled for 0730 case on 9/16 -NPO at midnight heading into 9/16 -ASA/Plavix -Lipitor  -Post cath instructions -Cardiac rehab    2.  Paroxysmal atrial tachycardia/SVT: -Likely exacerbating her underlying chronic dyspnea -Prior outpatient cardiac monitoring showed significant SVT burden with 25,709 episodes lasting up to 40 minutes and 12 seconds -Management of her SVT has been somewhat limited given underlying pulmonary disease and medication interactions with Celexa -Significant SVT burden again noted overnight -Now on amiodarone, would continue per typical loading  protocol of 400 mg bid x 7 days (9/21), 200 mg bid x 7 days (9/28), then 200 mg daily thereafter (10/5) -Recommend continuing PTA Cardizem CD 180 mg daily, baseline sinus rate precludes titration at this time -Previously on beta-blocker without good symptomatic control -Recommend revisiting with EP this admission    3.  Chronic dyspnea with asthma and history of PE: -She was diagnosed with  a PE in 11/2020 with persistent dyspnea since -Has been maintained on apixaban 5 mg twice daily since, though has only been taking apixaban once daily -Discontinued this admission given her PE was > 1 year ago with recent CTA showing no evidence of PE   4.  Macrocytic anemia: -Stable -Followed by hematology   5.  Multiple thyroid nodules: -Status post ultrasound recommendation for nonemergent biopsy -Will need outpatient follow-up with PCP   Informed Consent   Shared Decision Making/Informed Consent{  The risks [stroke (1 in 1000), death (1 in 1000), kidney failure [usually temporary] (1 in 500), bleeding (1 in 200), allergic reaction [possibly serious] (1 in 200)], benefits (diagnostic support and management of coronary artery disease) and alternatives of a cardiac catheterization were discussed in detail with Ms. Burbage and she is willing to proceed.      For questions or updates, please contact [facility] Please consult [email or web address] for contact info under Cardiology/STEMI.    Signed, Eula Listen, PA-C [facility] Pager: [phone] 10/01/2022, 7:34 AM  Physician Attestation Patient seen and examined, agree with detailed note by PA/NP unless otherwise stated in my note.   Patient presentation and plan discussed on rounds.      EKG lab work, chest x-ray,  and/or echocardiogram were reviewed independently by myself   Patient profile/history: 77 year old female with history of PE, CKD 3, paroxysmal SVT, coronary calcification presenting with chest pain and dyspnea.  Being seen for  significant CAD after left heart cath showed 85% proximal LAD stenosis.   Left heart cath and possible PCI planned tomorrow.  Denies chest pain, states feeling fatigued.   Telemetry with occasional SVT this morning.   Physical exam GEN:  Well nourished, well developed in no acute distress HEENT: Normal NECK: No JVD; No carotid bruits CARDIAC: RRR, no murmurs, rubs, gallops RESPIRATORY:  Clear to auscultation without rales, wheezing or rhonchi  ABDOMEN: Soft, non-tender, non-distended MUSCULOSKELETAL:  No edema; No deformity  SKIN: Warm and dry NEUROLOGIC:  Alert and oriented x 3 PSYCHIATRIC:  Normal affect    A/P: 1.  Chest pain, significant proximal LAD stenosis -Aspirin, Plavix, Lipitor 80 -EF 55 to 60% -Creatinine stable today compared to yesterday -Plan left heart cath tomorrow   2.  SVT -Ectopy much improved with amiodarone.  Continue p.o. Amio -Cardizem CD 180 mg daily -Will need outpatient EP evaluation   3.  History of remote PE -Repeat CT with no evidence of PE -Hold Eliquis, continue aspirin, Plavix   Greater than 50% was spent in counseling and coordination of care with patient Total encounter time 50 minutes.     Signed: Debbe Odea, M.D. [facility]

## 2022-10-01 NOTE — Progress Notes (Addendum)
Progress Note  Patient Name: Courtney Wilcox Date of Encounter: 10/01/2022  Primary Cardiologist: End Primary Electrophysiologist: Lalla Brothers   Subjective   Had a couple episodes of left flank pain overnight that lasted ~ 15-20 minutes. Some flank discomfort currently noted. No right radial arteriotomy site complications.   Inpatient Medications    Scheduled Meds:  amiodarone  400 mg Oral BID   aspirin  81 mg Oral Daily   atorvastatin  80 mg Oral Daily   clopidogrel  75 mg Oral Q breakfast   diltiazem  180 mg Oral Daily   enoxaparin (LOVENOX) injection  40 mg Subcutaneous Q24H   hydroxychloroquine  200 mg Oral Daily   leflunomide  10 mg Oral Daily   pantoprazole  40 mg Oral BID   predniSONE  2.5 mg Oral Q breakfast   sodium chloride flush  3 mL Intravenous Q12H   Continuous Infusions:  sodium chloride     PRN Meds: sodium chloride, acetaminophen, ALPRAZolam, levalbuterol, metoprolol tartrate, montelukast, ondansetron (ZOFRAN) IV, sodium chloride flush, triamcinolone   Vital Signs    Vitals:   09/30/22 1321 09/30/22 2006 09/30/22 2351 10/01/22 0430  BP: 121/81 121/82 137/78 124/77  Pulse: 71 92 73 68  Resp: 18 16 16 19   Temp: 97.8 F (36.6 C) (!) 97.5 F (36.4 C) 98.6 F (37 C) 98.4 F (36.9 C)  TempSrc:  Oral Oral Oral  SpO2: 100%  98% 100%  Weight:      Height:        Intake/Output Summary (Last 24 hours) at 10/01/2022 0734 Last data filed at 09/30/2022 0959 Gross per 24 hour  Intake 10 ml  Output --  Net 10 ml   Filed Weights   09/27/22 1742 09/29/22 1632  Weight: 85.7 kg 85.7 kg    Telemetry    SR with recurrence of multiple episodes of SVT and a 6 beat run of NSVT - Personally Reviewed  ECG    No new tracings - Personally Reviewed  Physical Exam   GEN: No acute distress.   Neck: No JVD. Cardiac: RRR, no murmurs, rubs, or gallops. Right radial arteriotomy site without active bleeding, swelling, warmth, erythema, bruising, or TTP. Radial  pulse 2+ proximal and distal to the arteriotomy site.  Respiratory: Clear to auscultation bilaterally.  GI: Soft, nontender, non-distended.   MS: No edema; No deformity. Neuro:  Alert and oriented x 3; Nonfocal.  Psych: Normal affect.  Labs    Chemistry Recent Labs  Lab 09/27/22 1806 09/28/22 1013 09/29/22 0554  NA 140 141 139  K 3.7 4.2 4.3  CL 107 107 106  CO2 22 24 24   GLUCOSE 99 85 109*  BUN 28* 29* 31*  CREATININE 1.50* 1.52* 1.59*  CALCIUM 8.7* 8.6* 8.4*  PROT 6.0*  --   --   ALBUMIN 3.4*  --   --   AST 17  --   --   ALT 15  --   --   ALKPHOS 66  --   --   BILITOT 0.6  --   --   GFRNONAA 36* 36* 34*  ANIONGAP 11 10 9      Hematology Recent Labs  Lab 09/27/22 1806 09/28/22 1013 09/29/22 0554  WBC 9.5 8.0 8.3  RBC 3.53* 3.20* 3.20*  HGB 11.6* 10.6* 10.7*  HCT 36.6 33.2* 33.0*  MCV 103.7* 103.8* 103.1*  MCH 32.9 33.1 33.4  MCHC 31.7 31.9 32.4  RDW 13.9 14.0 13.8  PLT 198 178 168  Cardiac EnzymesNo results for input(s): "TROPONINI" in the last 168 hours. No results for input(s): "TROPIPOC" in the last 168 hours.   BNP Recent Labs  Lab 09/27/22 1806  BNP 241.2*     DDimer No results for input(s): "DDIMER" in the last 168 hours.   Radiology    US THYROID  Result Date: 09/28/2022 IMPRESSION: 1. Heterogeneous and mildly enlarged thyroid gland. 2. Adjacent and abutting TI-RADS category 4 nodules in the left mid and left lower gland are noted. Both meet criteria to consider fine-needle aspiration biopsy. Non urgent biopsy is recommended. The above is in keeping with the ACR TI-RADS recommendations - J Am Coll Radiol 2017;14:587-595. Electronically Signed   By: Malachy Moan M.D.   On: 09/28/2022 11:34   CT Angio Chest PE W and/or Wo Contrast  Result Date: 09/27/2022 IMPRESSION: 1. No evidence of pulmonary embolism or acute cardiopulmonary disease. 2. Multiple heterogeneous thyroid nodules within an enlarged left lobe of the thyroid gland. Further  evaluation with nonemergent thyroid ultrasound is recommended. This follows ACR consensus guidelines: Managing Incidental Thyroid Nodules Detected on Imaging: White Paper of the ACR Incidental Thyroid Findings Committee. J Am Coll Radiol 2015; 12:143-150. 3. Small hiatal hernia. 4. Aortic atherosclerosis. Aortic Atherosclerosis (ICD10-I70.0). Electronically Signed   By: Aram Candela M.D.   On: 09/27/2022 20:36   DG Chest Port 1 View  Result Date: 09/27/2022 IMPRESSION: Cardiomegaly. No radiographic evidence of acute cardiopulmonary process. Electronically Signed   By: Lesia Hausen M.D.   On: 09/27/2022 19:37    Cardiac Studies   LHC 09/29/2022:   Prox RCA lesion is 30% stenosed.   Prox LAD lesion is 85% stenosed.   Prox LAD to Mid LAD lesion is 50% stenosed.   1.  Severe one-vessel coronary artery disease involving the ostial/proximal LAD 2.  Left ventricular angiography was not performed.  EF was normal by echo.  Normal left ventricular end-diastolic pressure. 3.  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 4.  Frequent supraventricular tachycardia throughout the case which made angiography difficult.   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  In addition, SVT has to be well-controlled before the procedure and thus I elected to start the patient on oral amiodarone as we are not able to increase diltiazem due to resting bradycardia. Given that the patient's pulmonary embolism was more than a year ago and she will require dual antiplatelet therapy, I favor not resuming Eliquis especially that recent CTA showed no evidence of pulmonary embolism.  I started aspirin and clopidogrel. The echo from today did not show evidence of  pulmonary hypertension and thus no need to attempt right heart catheterization at this time. __________  Luci Bank patch 04/2022:   The patient was monitored for 14 days.   The predominant rhythm was sinus with an average rate of 57 bpm while in sinus rhythm (range 42-101 bpm).   There were rare PACs and PVCs.   25,709 episode of supraventricular tachycardia cardia occurred, lasting up to 40:12 minutes with a maximum rate of 222 bpm.   A single 5 beat run of nonsustained ventricular tachycardia was observed with a maximum rate of 174 bpm.   There was no prolonged pause.   Patient triggered events corresponded to  sinus rhythm, SVT, and PACs.   Sinus rhythm with numerous episodes of SVT lasting up to 40 minutes, as detailed above.  Rare PACs and PVCs as well as a single brief run of NSVT also noted. __________   2D echo 10/31/2021: 1. Left ventricular ejection fraction, by estimation, is 60 to 65%. The  left ventricle has normal function. The left ventricle has no regional  wall motion abnormalities. There is mild left ventricular hypertrophy.  Left ventricular diastolic parameters  are consistent with Grade II diastolic dysfunction (pseudonormalization).   2. Right ventricular systolic function is normal. The right ventricular  size is normal.   3. Left atrial size was severely dilated.   4. The mitral valve is normal in structure. Mild mitral valve  regurgitation.   5. The aortic valve is tricuspid. Aortic valve regurgitation is not  visualized.   6. The inferior vena cava is normal in size with greater than 50%  respiratory variability, suggesting right atrial pressure of 3 mmHg.  __________   2D echo 11/20/2020: 1. Left ventricular ejection fraction, by estimation, is 45 to 50%. The  left ventricle has mildly decreased function. The left ventricle  demonstrates global hypokinesis. The left ventricular internal cavity size  was mildly to moderately dilated. There  is mild concentric  left ventricular hypertrophy. Left ventricular  diastolic parameters are consistent with Grade I diastolic dysfunction  (impaired relaxation).   2. Right ventricular systolic function is mildly reduced. The right  ventricular size is normal.   3. Left atrial size was severely dilated.   4. Right atrial size was moderately dilated.   5. The mitral valve is grossly normal. Mild to moderate mitral valve  regurgitation.   6. Tricuspid valve regurgitation is mild to moderate.   7. The aortic valve is calcified. Aortic valve regurgitation is not  visualized. Mild aortic valve sclerosis is present, with no evidence of  aortic valve stenosis.   Patient Profile     77 y.o. 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 Allena Katz.   Assessment & Plan    1. Coronary artery calcification with unstable angina: -Currently without symptoms of angina -Ruled out -[facility] 9/13 with severe one-vessel CAD involving the ostial/proximal LAD with recommendation for staged PCI via the left radial approach due to severe tortuosity of the right innominate artery -Scheduled for 0730 case on 9/16 -NPO at midnight heading into 9/16 -ASA/Plavix -Lipitor  -Post cath instructions -Cardiac rehab    2.  Paroxysmal atrial tachycardia/SVT: -Likely exacerbating her underlying chronic dyspnea -Prior outpatient cardiac monitoring showed significant SVT burden with 25,709 episodes lasting up to 40 minutes and 12 seconds -Management of her SVT has been somewhat limited given underlying pulmonary disease and medication interactions with Celexa -Significant SVT burden again noted overnight -Now on amiodarone, would continue per typical loading  protocol of 400 mg bid x 7 days (9/21), 200 mg bid x 7 days (9/28), then 200 mg daily thereafter (10/5) -Recommend continuing PTA Cardizem CD 180 mg daily, baseline sinus rate precludes titration at this time -Previously on beta-blocker without good symptomatic control -Recommend revisiting with EP this admission    3.  Chronic dyspnea with asthma and history of PE: -She was diagnosed with  a PE in 11/2020 with persistent dyspnea since -Has been maintained on apixaban 5 mg twice daily since, though has only been taking apixaban once daily -Discontinued this admission given her PE was > 1 year ago with recent CTA showing no evidence of PE   4.  Macrocytic anemia: -Stable -Followed by hematology   5.  Multiple thyroid nodules: -Status post ultrasound recommendation for nonemergent biopsy -Will need outpatient follow-up with PCP   Informed Consent   Shared Decision Making/Informed Consent{  The risks [stroke (1 in 1000), death (1 in 1000), kidney failure [usually temporary] (1 in 500), bleeding (1 in 200), allergic reaction [possibly serious] (1 in 200)], benefits (diagnostic support and management of coronary artery disease) and alternatives of a cardiac catheterization were discussed in detail with Ms. Burbage and she is willing to proceed.      For questions or updates, please contact [facility] Please consult [email or web address] for contact info under Cardiology/STEMI.    Signed, Eula Listen, PA-C [facility] Pager: [phone] 10/01/2022, 7:34 AM  Physician Attestation Patient seen and examined, agree with detailed note by PA/NP unless otherwise stated in my note.   Patient presentation and plan discussed on rounds.      EKG lab work, chest x-ray,  and/or echocardiogram were reviewed independently by myself   Patient profile/history: 77 year old female with history of PE, CKD 3, paroxysmal SVT, coronary calcification presenting with chest pain and dyspnea.  Being seen for  significant CAD after left heart cath showed 85% proximal LAD stenosis.   Left heart cath and possible PCI planned tomorrow.  Denies chest pain, states feeling fatigued.   Telemetry with occasional SVT this morning.   Physical exam GEN:  Well nourished, well developed in no acute distress HEENT: Normal NECK: No JVD; No carotid bruits CARDIAC: RRR, no murmurs, rubs, gallops RESPIRATORY:  Clear to auscultation without rales, wheezing or rhonchi  ABDOMEN: Soft, non-tender, non-distended MUSCULOSKELETAL:  No edema; No deformity  SKIN: Warm and dry NEUROLOGIC:  Alert and oriented x 3 PSYCHIATRIC:  Normal affect    A/P: 1.  Chest pain, significant proximal LAD stenosis -Aspirin, Plavix, Lipitor 80 -EF 55 to 60% -Creatinine stable today compared to yesterday -Plan left heart cath tomorrow   2.  SVT -Ectopy much improved with amiodarone.  Continue p.o. Amio -Cardizem CD 180 mg daily -Will need outpatient EP evaluation   3.  History of remote PE -Repeat CT with no evidence of PE -Hold Eliquis, continue aspirin, Plavix   Greater than 50% was spent in counseling and coordination of care with patient Total encounter time 50 minutes.     Signed: Debbe Odea, M.D. [facility]

## 2022-10-01 NOTE — Plan of Care (Signed)
  Problem: Education: Goal: Understanding of cardiac disease, CV risk reduction, and recovery process will improve Outcome: Progressing Goal: Individualized Educational Video(s) Outcome: Progressing   Problem: Activity: Goal: Ability to tolerate increased activity will improve Outcome: Progressing   Problem: Cardiac: Goal: Ability to achieve and maintain adequate cardiovascular perfusion will improve Outcome: Progressing   Problem: Health Behavior/Discharge Planning: Goal: Ability to safely manage health-related needs after discharge will improve Outcome: Progressing   Problem: Education: Goal: Understanding of CV disease, CV risk reduction, and recovery process will improve Outcome: Progressing Goal: Individualized Educational Video(s) Outcome: Progressing   Problem: Activity: Goal: Ability to return to baseline activity level will improve Outcome: Progressing   Problem: Cardiovascular: Goal: Ability to achieve and maintain adequate cardiovascular perfusion will improve Outcome: Progressing Goal: Vascular access site(s) Level 0-1 will be maintained Outcome: Progressing   Problem: Health Behavior/Discharge Planning: Goal: Ability to safely manage health-related needs after discharge will improve Outcome: Progressing   Problem: Health Behavior/Discharge Planning: Goal: Ability to manage health-related needs will improve Outcome: Progressing   Problem: Clinical Measurements: Goal: Ability to maintain clinical measurements within normal limits will improve Outcome: Progressing Goal: Will remain free from infection Outcome: Progressing Goal: Diagnostic test results will improve Outcome: Progressing Goal: Respiratory complications will improve Outcome: Progressing Goal: Cardiovascular complication will be avoided Outcome: Progressing   Problem: Activity: Goal: Risk for activity intolerance will decrease Outcome: Progressing   Problem: Nutrition: Goal: Adequate  nutrition will be maintained Outcome: Progressing   Problem: Coping: Goal: Level of anxiety will decrease Outcome: Progressing   Problem: Elimination: Goal: Will not experience complications related to bowel motility Outcome: Progressing Goal: Will not experience complications related to urinary retention Outcome: Progressing   Problem: Pain Managment: Goal: General experience of comfort will improve Outcome: Progressing   Problem: Safety: Goal: Ability to remain free from injury will improve Outcome: Progressing   Problem: Skin Integrity: Goal: Risk for impaired skin integrity will decrease Outcome: Progressing

## 2022-10-02 ENCOUNTER — Other Ambulatory Visit: Payer: Self-pay

## 2022-10-02 ENCOUNTER — Encounter
Admission: EM | Disposition: A | Discharge status: Home / Self Care | Payer: Self-pay | Source: Home / Self Care | Attending: Internal Medicine

## 2022-10-02 ENCOUNTER — Encounter: Payer: Self-pay | Admitting: Internal Medicine

## 2022-10-02 DIAGNOSIS — I200 Unstable angina: Secondary | ICD-10-CM | POA: Diagnosis not present

## 2022-10-02 DIAGNOSIS — K219 Gastro-esophageal reflux disease without esophagitis: Secondary | ICD-10-CM | POA: Diagnosis not present

## 2022-10-02 DIAGNOSIS — N189 Chronic kidney disease, unspecified: Secondary | ICD-10-CM | POA: Diagnosis not present

## 2022-10-02 DIAGNOSIS — I25110 Atherosclerotic heart disease of native coronary artery with unstable angina pectoris: Secondary | ICD-10-CM | POA: Diagnosis not present

## 2022-10-02 DIAGNOSIS — N179 Acute kidney failure, unspecified: Secondary | ICD-10-CM

## 2022-10-02 DIAGNOSIS — D638 Anemia in other chronic diseases classified elsewhere: Secondary | ICD-10-CM | POA: Diagnosis not present

## 2022-10-02 DIAGNOSIS — N1832 Chronic kidney disease, stage 3b: Secondary | ICD-10-CM | POA: Diagnosis not present

## 2022-10-02 HISTORY — PX: CORONARY STENT INTERVENTION: CATH118234

## 2022-10-02 LAB — BASIC METABOLIC PANEL
Anion gap: 6 (ref 5–15)
BUN: 25 mg/dL — ABNORMAL HIGH (ref 8–23)
CO2: 26 mmol/L (ref 22–32)
Calcium: 8.2 mg/dL — ABNORMAL LOW (ref 8.9–10.3)
Chloride: 108 mmol/L (ref 98–111)
Creatinine, Ser: 1.67 mg/dL — ABNORMAL HIGH (ref 0.44–1.00)
GFR, Estimated: 32 mL/min — ABNORMAL LOW (ref >60)
Glucose, Bld: 91 mg/dL (ref 70–99)
Potassium: 3.6 mmol/L (ref 3.5–5.1)
Sodium: 140 mmol/L (ref 135–145)

## 2022-10-02 LAB — POCT ACTIVATED CLOTTING TIME: Activated Clotting Time: 330 s

## 2022-10-02 SURGERY — CORONARY STENT INTERVENTION
Anesthesia: Moderate Sedation

## 2022-10-02 MED ORDER — MIDAZOLAM HCL 2 MG/2ML IJ SOLN
INTRAMUSCULAR | Status: DC | PRN
  Administered 2022-10-02 (×2): 1 mg via INTRAVENOUS

## 2022-10-02 MED ORDER — HEPARIN (PORCINE) IN NACL 1000-0.9 UT/500ML-% IV SOLN
INTRAVENOUS | Status: AC
  Filled 2022-10-02: qty 500

## 2022-10-02 MED ORDER — MIDAZOLAM HCL 2 MG/2ML IJ SOLN
INTRAMUSCULAR | Status: AC
  Filled 2022-10-02: qty 2

## 2022-10-02 MED ORDER — SODIUM CHLORIDE 0.9% FLUSH
3.0000 mL | Freq: Two times a day (BID) | INTRAVENOUS | Status: DC
  Administered 2022-10-03: 3 mL via INTRAVENOUS

## 2022-10-02 MED ORDER — FENTANYL CITRATE (PF) 100 MCG/2ML IJ SOLN
INTRAMUSCULAR | Status: AC
  Filled 2022-10-02: qty 2

## 2022-10-02 MED ORDER — HEPARIN SODIUM (PORCINE) 1000 UNIT/ML IJ SOLN
INTRAMUSCULAR | Status: DC | PRN
  Administered 2022-10-02: 8000 [IU] via INTRAVENOUS

## 2022-10-02 MED ORDER — FENTANYL CITRATE (PF) 100 MCG/2ML IJ SOLN
INTRAMUSCULAR | Status: DC | PRN
  Administered 2022-10-02 (×2): 25 ug via INTRAVENOUS

## 2022-10-02 MED ORDER — SODIUM CHLORIDE 0.9 % WEIGHT BASED INFUSION
1.0000 mL/kg/h | INTRAVENOUS | Status: AC
  Administered 2022-10-02 (×2): 1 mL/kg/h via INTRAVENOUS

## 2022-10-02 MED ORDER — VERAPAMIL HCL 2.5 MG/ML IV SOLN
INTRAVENOUS | Status: AC
  Filled 2022-10-02: qty 2

## 2022-10-02 MED ORDER — CLOPIDOGREL BISULFATE 75 MG PO TABS
ORAL_TABLET | ORAL | Status: DC | PRN
  Administered 2022-10-02: 300 mg via ORAL

## 2022-10-02 MED ORDER — IOHEXOL 300 MG/ML  SOLN
INTRAMUSCULAR | Status: DC | PRN
  Administered 2022-10-02: 68 mL

## 2022-10-02 MED ORDER — HEPARIN SODIUM (PORCINE) 1000 UNIT/ML IJ SOLN
INTRAMUSCULAR | Status: AC
  Filled 2022-10-02: qty 10

## 2022-10-02 MED ORDER — SODIUM CHLORIDE 0.9 % IV SOLN: 250.0000 mL | INTRAVENOUS | Status: DC | PRN

## 2022-10-02 MED ORDER — HEPARIN (PORCINE) IN NACL 1000-0.9 UT/500ML-% IV SOLN
INTRAVENOUS | Status: AC
  Filled 2022-10-02: qty 1000

## 2022-10-02 MED ORDER — HEPARIN (PORCINE) IN NACL 2000-0.9 UNIT/L-% IV SOLN
INTRAVENOUS | Status: DC | PRN
  Administered 2022-10-02: 1000 mL

## 2022-10-02 MED ORDER — SODIUM CHLORIDE 0.9% FLUSH: 3.0000 mL | INTRAVENOUS | Status: DC | PRN

## 2022-10-02 MED ORDER — LIDOCAINE HCL (PF) 1 % IJ SOLN
INTRAMUSCULAR | Status: DC | PRN
  Administered 2022-10-02: 2 mL
  Administered 2022-10-02: 10 mL

## 2022-10-02 MED ORDER — ASPIRIN 81 MG PO CHEW
CHEWABLE_TABLET | ORAL | Status: AC
  Filled 2022-10-02: qty 1

## 2022-10-02 MED ORDER — CLOPIDOGREL BISULFATE 300 MG PO TABS
ORAL_TABLET | ORAL | Status: AC
  Filled 2022-10-02: qty 1

## 2022-10-02 SURGICAL SUPPLY — 19 items
BALLN ~~LOC~~ TREK NEO RX 3.75X12 (BALLOONS) ×1
BALLOON ~~LOC~~ TREK NEO RX 3.75X12 (BALLOONS) IMPLANT
CATH LAUNCHER 6FR EBU 4 (CATHETERS) IMPLANT
DEVICE RAD TR BAND REGULAR (VASCULAR PRODUCTS) IMPLANT
DRAPE BRACHIAL (DRAPES) IMPLANT
GLIDESHEATH SLEND SS 6F .021 (SHEATH) IMPLANT
GUIDEWIRE INQWIRE 1.5J.035X260 (WIRE) IMPLANT
INQWIRE 1.5J .035X260CM (WIRE) ×1
KIT ENCORE 26 ADVANTAGE (KITS) IMPLANT
KIT MICROPUNCTURE NIT STIFF (SHEATH) IMPLANT
KIT SYRINGE INJ CVI SPIKEX1 (MISCELLANEOUS) IMPLANT
PACK CARDIAC CATH (CUSTOM PROCEDURE TRAY) ×1 IMPLANT
PROTECTION STATION PRESSURIZED (MISCELLANEOUS) ×1
SET ATX-X65L (MISCELLANEOUS) IMPLANT
SHEATH BRITE TIP 4FRX11 (SHEATH) IMPLANT
STATION PROTECTION PRESSURIZED (MISCELLANEOUS) IMPLANT
STENT ONYX FRONTIER 3.5X26 (Permanent Stent) IMPLANT
TUBING CIL FLEX 10 FLL-RA (TUBING) IMPLANT
WIRE RUNTHROUGH .014X180CM (WIRE) IMPLANT

## 2022-10-02 NOTE — Plan of Care (Signed)
  Problem: Education: Goal: Understanding of cardiac disease, CV risk reduction, and recovery process will improve Outcome: Progressing Goal: Individualized Educational Video(s) Outcome: Progressing   Problem: Activity: Goal: Ability to tolerate increased activity will improve Outcome: Progressing   Problem: Cardiac: Goal: Ability to achieve and maintain adequate cardiovascular perfusion will improve Outcome: Progressing   Problem: Health Behavior/Discharge Planning: Goal: Ability to safely manage health-related needs after discharge will improve Outcome: Progressing   Problem: Education: Goal: Understanding of CV disease, CV risk reduction, and recovery process will improve Outcome: Progressing Goal: Individualized Educational Video(s) Outcome: Progressing   Problem: Activity: Goal: Ability to return to baseline activity level will improve Outcome: Progressing   Problem: Cardiovascular: Goal: Ability to achieve and maintain adequate cardiovascular perfusion will improve Outcome: Progressing Goal: Vascular access site(s) Level 0-1 will be maintained Outcome: Progressing   Problem: Health Behavior/Discharge Planning: Goal: Ability to safely manage health-related needs after discharge will improve Outcome: Progressing   Problem: Health Behavior/Discharge Planning: Goal: Ability to manage health-related needs will improve Outcome: Progressing   Problem: Clinical Measurements: Goal: Ability to maintain clinical measurements within normal limits will improve Outcome: Progressing Goal: Will remain free from infection Outcome: Progressing Goal: Diagnostic test results will improve Outcome: Progressing Goal: Respiratory complications will improve Outcome: Progressing Goal: Cardiovascular complication will be avoided Outcome: Progressing   Problem: Activity: Goal: Risk for activity intolerance will decrease Outcome: Progressing   Problem: Nutrition: Goal: Adequate  nutrition will be maintained Outcome: Progressing   Problem: Coping: Goal: Level of anxiety will decrease Outcome: Progressing   Problem: Elimination: Goal: Will not experience complications related to bowel motility Outcome: Progressing Goal: Will not experience complications related to urinary retention Outcome: Progressing   Problem: Pain Managment: Goal: General experience of comfort will improve Outcome: Progressing   Problem: Safety: Goal: Ability to remain free from injury will improve Outcome: Progressing   Problem: Skin Integrity: Goal: Risk for impaired skin integrity will decrease Outcome: Progressing

## 2022-10-02 NOTE — TOC Progression Note (Signed)
Transition of Care [facility]) - Progression Note    Patient Details  Name: Courtney Wilcox MRN: [id number] Date of Birth: 05-28-1945  [facility] CM/SW Contact  Truddie Hidden, RN Phone Number: 10/02/2022, 12:29 PM  Clinical Narrative:     TOC continuing to follow patient's progress throughout discharge planning.        Expected Discharge Plan and Services                                               Social Determinants of Health (SDOH) Interventions SDOH Screenings   Food Insecurity: No Food Insecurity (09/28/2022)  Housing: Low Risk  (09/28/2022)  Transportation Needs: No Transportation Needs (09/28/2022)  Utilities: Not At Risk (09/28/2022)  Tobacco Use: Low Risk  (10/02/2022)    Readmission Risk Interventions     No data to display

## 2022-10-02 NOTE — Interval H&P Note (Signed)
History and Physical Interval Note:  10/02/2022 8:14 AM  Courtney Wilcox  has presented today for surgery, with the diagnosis of unstable angina.  The various methods of treatment have been discussed with the patient and family. After consideration of risks, benefits and other options for treatment, the patient has consented to  Procedure(s): CORONARY STENT INTERVENTION (N/A) as a surgical intervention.  The patient's history has been reviewed, patient examined, no change in status, stable for surgery.  I have reviewed the patient's chart and labs.  Questions were answered to the patient's satisfaction.     Lorine Bears

## 2022-10-02 NOTE — Care Management Important Message (Signed)
Important Message  Patient Details  Name: Courtney Wilcox MRN: [id number] Date of Birth: 03/25/1945   Medicare Important Message Given:  Yes  Patient out of room upon time of visit for procedure, no family in room.  Confirmed copy of Medicare IM available in room for reference.   Johnell Comings 10/02/2022, 11:18 AM

## 2022-10-02 NOTE — Plan of Care (Signed)
Patient has progressed will.  Cath procedure done today.

## 2022-10-02 NOTE — Progress Notes (Signed)
Progress Note  Patient Name: Courtney Wilcox Date of Encounter: 10/02/2022  Primary Cardiologist: End Primary Electrophysiologist: Lalla Brothers   Subjective   Successful drug-eluting stent placement to proximal LAD extending to the ostium this morning.  She reports mild chest discomfort but no EKG changes. She had no SVT at all during cardiac cath procedure.  Inpatient Medications    Scheduled Meds:  [MAR Hold] amiodarone  400 mg Oral BID   [MAR Hold] aspirin  81 mg Oral Daily   [MAR Hold] atorvastatin  80 mg Oral Daily   [MAR Hold] clopidogrel  75 mg Oral Q breakfast   [MAR Hold] diltiazem  180 mg Oral Daily   [MAR Hold] enoxaparin (LOVENOX) injection  40 mg Subcutaneous Q24H   [MAR Hold] hydroxychloroquine  200 mg Oral Daily   [MAR Hold] leflunomide  10 mg Oral Daily   [MAR Hold] pantoprazole  40 mg Oral BID   [MAR Hold] predniSONE  2.5 mg Oral Q breakfast   [MAR Hold] sodium chloride flush  3 mL Intravenous Q12H   Continuous Infusions:  [MAR Hold] sodium chloride     PRN Meds: [MAR Hold] sodium chloride, [MAR Hold] acetaminophen, [MAR Hold] ALPRAZolam, clopidogrel, fentaNYL, Heparin (Porcine) in NaCl, heparin sodium (porcine), iohexol, [MAR Hold] levalbuterol, lidocaine (PF), [MAR Hold] metoprolol tartrate, midazolam, [MAR Hold] montelukast, [MAR Hold] ondansetron (ZOFRAN) IV, [MAR Hold] sodium chloride flush, [MAR Hold] triamcinolone   Vital Signs    Vitals:   10/01/22 1752 10/01/22 2006 10/01/22 2359 10/02/22 0343  BP: 126/82 (!) 145/89 115/69 134/83  Pulse: 84 100 83 83  Resp:   17 18  Temp: 98.3 F (36.8 C) 97.6 F (36.4 C) 98.6 F (37 C) 99.3 F (37.4 C)  TempSrc: Oral Oral    SpO2: 96% 100% 97% 99%  Weight:    85.7 kg  Height:    5\' 2"  (1.575 m)   No intake or output data in the 24 hours ending 10/02/22 0945  Filed Weights   09/27/22 1742 09/29/22 1632 10/02/22 0343  Weight: 85.7 kg 85.7 kg 85.7 kg    Telemetry    Sinus rhythm with improvement in  SVT burden- Personally Reviewed  ECG    No new tracings - Personally Reviewed  Physical Exam   GEN: No acute distress.   Neck: No JVD. Cardiac: RRR, no murmurs, rubs, or gallops. Right radial arteriotomy site without active bleeding, swelling, warmth, erythema, bruising, or TTP. Radial pulse 2+ proximal and distal to the arteriotomy site.  Respiratory: Clear to auscultation bilaterally.  GI: Soft, nontender, non-distended.   MS: No edema; No deformity. Neuro:  Alert and oriented x 3; Nonfocal.  Psych: Normal affect.  Labs    Chemistry Recent Labs  Lab 09/27/22 1806 09/28/22 1013 09/29/22 0554 10/01/22 1157 10/02/22 0526  NA 140   < > 139 138 140  K 3.7   < > 4.3 3.8 3.6  CL 107   < > 106 106 108  CO2 22   < > 24 22 26   GLUCOSE 99   < > 109* 81 91  BUN 28*   < > 31* 26* 25*  CREATININE 1.50*   < > 1.59* 1.66* 1.67*  CALCIUM 8.7*   < > 8.4* 8.3* 8.2*  PROT 6.0*  --   --   --   --   ALBUMIN 3.4*  --   --   --   --   AST 17  --   --   --   --  ALT 15  --   --   --   --   ALKPHOS 66  --   --   --   --   BILITOT 0.6  --   --   --   --   GFRNONAA 36*   < > 34* 32* 32*  ANIONGAP 11   < > 9 10 6    < > = values in this interval not displayed.     Hematology Recent Labs  Lab 09/27/22 1806 09/28/22 1013 09/29/22 0554  WBC 9.5 8.0 8.3  RBC 3.53* 3.20* 3.20*  HGB 11.6* 10.6* 10.7*  HCT 36.6 33.2* 33.0*  MCV 103.7* 103.8* 103.1*  MCH 32.9 33.1 33.4  MCHC 31.7 31.9 32.4  RDW 13.9 14.0 13.8  PLT 198 178 168    Cardiac EnzymesNo results for input(s): "TROPONINI" in the last 168 hours. No results for input(s): "TROPIPOC" in the last 168 hours.   BNP Recent Labs  Lab 09/27/22 1806  BNP 241.2*     DDimer No results for input(s): "DDIMER" in the last 168 hours.   Radiology    US THYROID  Result Date: 09/28/2022 IMPRESSION: 1. Heterogeneous and mildly enlarged thyroid gland. 2. Adjacent and abutting TI-RADS category 4 nodules in the left mid and left lower  gland are noted. Both meet criteria to consider fine-needle aspiration biopsy. Non urgent biopsy is recommended. The above is in keeping with the ACR TI-RADS recommendations - J Am Coll Radiol 2017;14:587-595. Electronically Signed   By: Malachy Moan M.D.   On: 09/28/2022 11:34   CT Angio Chest PE W and/or Wo Contrast  Result Date: 09/27/2022 IMPRESSION: 1. No evidence of pulmonary embolism or acute cardiopulmonary disease. 2. Multiple heterogeneous thyroid nodules within an enlarged left lobe of the thyroid gland. Further evaluation with nonemergent thyroid ultrasound is recommended. This follows ACR consensus guidelines: Managing Incidental Thyroid Nodules Detected on Imaging: White Paper of the ACR Incidental Thyroid Findings Committee. J Am Coll Radiol 2015; 12:143-150. 3. Small hiatal hernia. 4. Aortic atherosclerosis. Aortic Atherosclerosis (ICD10-I70.0). Electronically Signed   By: Aram Candela M.D.   On: 09/27/2022 20:36   DG Chest Port 1 View  Result Date: 09/27/2022 IMPRESSION: Cardiomegaly. No radiographic evidence of acute cardiopulmonary process. Electronically Signed   By: Lesia Hausen M.D.   On: 09/27/2022 19:37    Cardiac Studies   LHC 09/29/2022:   Prox RCA lesion is 30% stenosed.   Prox LAD lesion is 85% stenosed.   Prox LAD to Mid LAD lesion is 50% stenosed.   1.  Severe one-vessel coronary artery disease involving the ostial/proximal LAD 2.  Left ventricular angiography was not performed.  EF was normal by echo.  Normal left ventricular end-diastolic pressure. 3.  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 4.  Frequent supraventricular tachycardia throughout the case which made angiography difficult.   Recommendations: Recommend staged LAD PCI on Monday morning via the left radial approach due to  severe tortuosity of the right innominate artery.  In addition, SVT has to be well-controlled before the procedure and thus I elected to start the patient on oral amiodarone as we are not able to increase diltiazem due to resting bradycardia. Given that the patient's pulmonary embolism was more than a year ago and she will require dual antiplatelet therapy, I favor not resuming  Eliquis especially that recent CTA showed no evidence of pulmonary embolism.  I started aspirin and clopidogrel. The echo from today did not show evidence of pulmonary hypertension and thus no need to attempt right heart catheterization at this time. __________  Luci Bank patch 04/2022:   The patient was monitored for 14 days.   The predominant rhythm was sinus with an average rate of 57 bpm while in sinus rhythm (range 42-101 bpm).   There were rare PACs and PVCs.   25,709 episode of supraventricular tachycardia cardia occurred, lasting up to 40:12 minutes with a maximum rate of 222 bpm.   A single 5 beat run of nonsustained ventricular tachycardia was observed with a maximum rate of 174 bpm.   There was no prolonged pause.   Patient triggered events corresponded to sinus rhythm, SVT, and PACs.   Sinus rhythm with numerous episodes of SVT lasting up to 40 minutes, as detailed above.  Rare PACs and PVCs as well as a single brief run of NSVT also noted. __________   2D echo 10/31/2021: 1. Left ventricular ejection fraction, by estimation, is 60 to 65%. The  left ventricle has normal function. The left ventricle has no regional  wall motion abnormalities. There is mild left ventricular hypertrophy.  Left ventricular diastolic parameters  are consistent with Grade II diastolic dysfunction (pseudonormalization).   2. Right ventricular systolic function is normal. The right ventricular  size is normal.   3. Left atrial size was severely dilated.   4. The mitral valve is normal in structure. Mild mitral valve  regurgitation.    5. The aortic valve is tricuspid. Aortic valve regurgitation is not  visualized.   6. The inferior vena cava is normal in size with greater than 50%  respiratory variability, suggesting right atrial pressure of 3 mmHg.  __________   2D echo 11/20/2020: 1. Left ventricular ejection fraction, by estimation, is 45 to 50%. The  left ventricle has mildly decreased function. The left ventricle  demonstrates global hypokinesis. The left ventricular internal cavity size  was mildly to moderately dilated. There  is mild concentric left ventricular hypertrophy. Left ventricular  diastolic parameters are consistent with Grade I diastolic dysfunction  (impaired relaxation).   2. Right ventricular systolic function is mildly reduced. The right  ventricular size is normal.   3. Left atrial size was severely dilated.   4. Right atrial size was moderately dilated.   5. The mitral valve is grossly normal. Mild to moderate mitral valve  regurgitation.   6. Tricuspid valve regurgitation is mild to moderate.   7. The aortic valve is calcified. Aortic valve regurgitation is not  visualized. Mild aortic valve sclerosis is present, with no evidence of  aortic valve stenosis.   Patient Profile     77 y.o. 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 Allena Katz.   Assessment & Plan    1. Coronary artery calcification with unstable angina: -Currently without symptoms of angina -Ruled out -LHC 9/13 with severe one-vessel CAD involving the ostial/proximal LAD .  Status post LAD PCI and drug-eluting stent placement this morning.   Continue dual antiplatelet therapy for at least 6 months.   Continue high-dose atorvastatin.   She  was referred  to cardiac rehab.     2.  Paroxysmal atrial tachycardia/SVT: -Significant burden was noted last week especially during cardiac catheterization.  Since then, she was started on oral amiodarone with significant improvement.  She had no SVT during cardiac catheterization this morning. Continue amiodarone load as recommended with a maintenance dose of 200 mg once daily.   3.  Chronic dyspnea with asthma and history of PE: -She was diagnosed with a PE in 11/2020 with persistent dyspnea since -Has been maintained on apixaban 5 mg twice daily since, though has only been taking apixaban once daily -Discontinued this admission given her PE was > 1 year ago with recent CTA showing no evidence of PE.  The patient is currently on dual antiplatelet therapy for drug-eluting stent placement.   4.  Macrocytic anemia: -Stable -Followed by hematology   5.  Multiple thyroid nodules: -Status post ultrasound recommendation for nonemergent biopsy -Will need outpatient follow-up with PCP  6.  Acute on chronic kidney disease: Will hydrate overnight postcontrast exposure and recheck renal function in the morning.  From a cardiac standpoint, the patient can be discharged home tomorrow.   Signed, Lorine Bears, MD [facility] 10/02/2022, 9:45 AM

## 2022-10-02 NOTE — Progress Notes (Signed)
[facility]  - [place] at [facility]   PATIENT NAME: Courtney Wilcox    MR#:  [id number]  DATE OF BIRTH:  1945-02-10  SUBJECTIVE:  Pt seen in Special recovery dter  at bedside Post cath with stent in LAD. Had some chest heaviness earlier  VITALS:  Blood pressure 137/69, pulse 80, temperature 98 F (36.7 C), temperature source Axillary, resp. rate 19, height 5\' 2"  (1.575 m), weight 85.7 kg, SpO2 96%.  PHYSICAL EXAMINATION:   GENERAL:  77 y.o.-year-old patient with no acute distress. Obese LUNGS: Normal breath sounds bilaterally, no wheezing CARDIOVASCULAR: S1, S2 normal. No murmur   ABDOMEN: Soft, nontender, nondistended. Bowel sounds present.  EXTREMITIES: No  edema b/l.    NEUROLOGIC: nonfocal  patient is alert and awake  LABORATORY PANEL:  CBC Recent Labs  Lab 09/29/22 0554  WBC 8.3  HGB 10.7*  HCT 33.0*  PLT 168    Chemistries  Recent Labs  Lab 09/27/22 1806 09/28/22 1013 10/02/22 0526  NA 140   < > 140  K 3.7   < > 3.6  CL 107   < > 108  CO2 22   < > 26  GLUCOSE 99   < > 91  BUN 28*   < > 25*  CREATININE 1.50*   < > 1.67*  CALCIUM 8.7*   < > 8.2*  MG 1.8  --   --   AST 17  --   --   ALT 15  --   --   ALKPHOS 66  --   --   BILITOT 0.6  --   --    < > = values in this interval not displayed.   Cardiac Enzymes No results for input(s): "TROPONINI" in the last 168 hours. RADIOLOGY:  CARDIAC CATHETERIZATION  Result Date: 10/02/2022   Prox LAD lesion is 85% stenosed.   Prox LAD to Mid LAD lesion is 50% stenosed.   Prox RCA lesion is 30% stenosed.   A drug-eluting stent was successfully placed using a STENT ONYX FRONTIER [id number].   Post intervention, there is a 10% residual stenosis.   Post intervention, there is a 0% residual stenosis. Successful drug-eluting stent placement to the ostial/proximal LAD.  The lesion is very eccentric and fibrotic in the proximal segment and thus there was a 10% residual stenosis and spite of high-pressure  noncompliant balloon. Recommendations: Dual antiplatelet therapy for at least 6 months. Aggressive treatment of risk factors. Hydrate overnight and check renal function in the morning.  68 mL of contrast was used for the procedure.    Assessment and Plan  77yo with h/o SVT, depression, HTN, OSA, RA, and PE who presented on 9/11 with chest pain with runs of SVT, sent by cardiology.  CTA negative for PE, has multiple thyroid nodules.  She was recently diagnosed with a sinus infection and is being treated with Ceftin and prednisone.  Cardiology was concerned for unstable angina; she is likely to need cardiac catheterization on 9/13 after Eliquis washout.   Unstable angina (HCC) --Presented to cardiology office with CP, noted to have runs of SVT --Intermittent tachycardia with HR as high as 140-150 and improves with rest/deep breathing.  --EKG shows HR of 94 and SR with frequent PAC.  --CTA chest is negative, no evidence of PE or CAD --Clinically improved with [place] O2 although not hypoxic --[facility] consulted --S/p  cardiac cath on 9/13--LHC 09/29/2022:   Prox RCA lesion is 30% stenosed.   Prox LAD  lesion is 85% stenosed.   Prox LAD to Mid LAD lesion is 50% stenosed. -- Recommends stage LAD PCI on Monday via left radial approach per Dr. Kirke Corin -- eliquis discontinued by cardiology  -- patient now on aspirin and Plavix --s/p Cath with staged PCI with DES  to proximal LAD-- cont DAPT, high intensity statins --cardiac rehab  Rheumatoid arthritis (HCC) --Continue leflunomide, Plaquenil, prednisone   Anemia of chronic disease --Appears to be stable   GERD without esophagitis --Continue Pepcid, Protonix   Chronic kidney disease, stage 3b (HCC) Appears to be stable at this time  avoid nephrotoxic medications   Class 1 obesity due to excess calories with body mass index (BMI) of 34.0 to 34.9 in adult Body mass index is 34.57 kg/m.Marland Kitchen  Weight loss should be encouraged   History of  pulmonary embolism --Per Dr Kirke Corin " Given that the patient's pulmonary embolism was more than a year ago and she will require dual antiplatelet therapy, I favor not resuming Eliquis especially that recent CTA showed no evidence of pulmonary embolism. I started aspirin and clopidogrel. "  History of atrial flutter PSVT (paroxysmal supraventricular tachycardia) --Cardiology started Amiodarone --Cont diltiazem --So far in SR   Multiple thyroid nodules --Thyroid US to evaluate as thyroid had multiple heterogenous thyroid nodules --Normal TSH --Thyroid US:  Heterogeneous and mildly enlarged thyroid gland.  2. Adjacent and abutting TI-RADS category 4 nodules in the left mid and left lower gland are noted. Both meet criteria to consider fine-needle aspiration biopsy. Non urgent biopsy is recommended. Will defer to PCP but will need further evaluation.   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  >50% time spent on counselling and coordination of care  Note: This dictation was prepared with Dragon dictation along with smaller phrase technology. Any transcriptional errors that result from this process are unintentional.  Enedina Finner M.D    [facility]   CC: Primary care physician; Patrice Paradise, MD

## 2022-10-03 ENCOUNTER — Encounter: Payer: Self-pay | Admitting: Cardiovascular Disease

## 2022-10-03 DIAGNOSIS — Z955 Presence of coronary angioplasty implant and graft: Secondary | ICD-10-CM

## 2022-10-03 DIAGNOSIS — I471 Supraventricular tachycardia, unspecified: Secondary | ICD-10-CM | POA: Diagnosis not present

## 2022-10-03 DIAGNOSIS — I200 Unstable angina: Secondary | ICD-10-CM

## 2022-10-03 LAB — BASIC METABOLIC PANEL
Anion gap: 7 (ref 5–15)
BUN: 19 mg/dL (ref 8–23)
CO2: 25 mmol/L (ref 22–32)
Calcium: 8.2 mg/dL — ABNORMAL LOW (ref 8.9–10.3)
Chloride: 110 mmol/L (ref 98–111)
Creatinine, Ser: 1.62 mg/dL — ABNORMAL HIGH (ref 0.44–1.00)
GFR, Estimated: 33 mL/min — ABNORMAL LOW (ref >60)
Glucose, Bld: 85 mg/dL (ref 70–99)
Potassium: 3.7 mmol/L (ref 3.5–5.1)
Sodium: 142 mmol/L (ref 135–145)

## 2022-10-03 LAB — CBC
HCT: 29.6 % — ABNORMAL LOW (ref 36.0–46.0)
Hemoglobin: 9.5 g/dL — ABNORMAL LOW (ref 12.0–15.0)
MCH: 33.5 pg (ref 26.0–34.0)
MCHC: 32.1 g/dL (ref 30.0–36.0)
MCV: 104.2 fL — ABNORMAL HIGH (ref 80.0–100.0)
Platelets: 126 10*3/uL — ABNORMAL LOW (ref 150–400)
RBC: 2.84 MIL/uL — ABNORMAL LOW (ref 3.87–5.11)
RDW: 13.9 % (ref 11.5–15.5)
WBC: 7.1 10*3/uL (ref 4.0–10.5)
nRBC: 0 % (ref 0.0–0.2)

## 2022-10-03 MED ORDER — AMIODARONE HCL 400 MG PO TABS: 200.0000 mg | ORAL_TABLET | ORAL | 0 refills | Status: DC

## 2022-10-03 MED ORDER — CLOPIDOGREL BISULFATE 75 MG PO TABS: 75.0000 mg | ORAL_TABLET | Freq: Every day | ORAL | 1 refills | Status: DC

## 2022-10-03 MED ORDER — ALPRAZOLAM 0.5 MG PO TABS: 0.5000 mg | ORAL_TABLET | Freq: Every evening | ORAL | 0 refills | Status: DC | PRN

## 2022-10-03 MED ORDER — PREDNISONE 2.5 MG PO TABS: 2.5000 mg | ORAL_TABLET | Freq: Every day | ORAL | 0 refills | Status: DC

## 2022-10-03 MED ORDER — ASPIRIN 81 MG PO CHEW: 81.0000 mg | CHEWABLE_TABLET | Freq: Every day | ORAL | 1 refills | Status: DC

## 2022-10-03 MED ORDER — PANTOPRAZOLE SODIUM 40 MG PO TBEC: 40.0000 mg | DELAYED_RELEASE_TABLET | Freq: Two times a day (BID) | ORAL | 1 refills | Status: DC

## 2022-10-03 MED ORDER — ATORVASTATIN CALCIUM 80 MG PO TABS: 80.0000 mg | ORAL_TABLET | Freq: Every day | ORAL | 1 refills | Status: DC

## 2022-10-03 NOTE — Progress Notes (Signed)
   Patient Name: Courtney Wilcox Date of Encounter: 10/03/2022 [facility] Cardiologist: Yvonne Kendall, MD   Interval Summary  .    Feeling better.  Patient notes a few twinges of pain in the left breast that come and go.  More pressure-like chest pain present on admission has resolved.  No shortness of breath or edema reported.  Mild soreness present at left radial cath site.  Vital Signs .    Vitals:   10/02/22 1555 10/02/22 2345 10/03/22 0429 10/03/22 0812  BP: (!) 98/37 132/83 115/60 (!) 153/70  Pulse: 91 74 76 71  Resp: 14 17 16 16   Temp: 97.6 F (36.4 C) 98.8 F (37.1 C) 97.9 F (36.6 C) 98.8 F (37.1 C)  TempSrc:    Oral  SpO2: 100% 95% 95% 99%  Weight:      Height:        Intake/Output Summary (Last 24 hours) at 10/03/2022 0834 Last data filed at 10/02/2022 1022 Gross per 24 hour  Intake 272.85 ml  Output --  Net 272.85 ml      10/02/2022    3:43 AM 09/29/2022    4:32 PM 09/27/2022    5:42 PM  Last 3 Weights  Weight (lbs) 188 lb 15 oz 189 lb 189 lb  Weight (kg) 85.7 kg 85.73 kg 85.73 kg      Telemetry/ECG    NSR with PAC's.  No SVT. - Personally Reviewed  Physical Exam .   GEN: No acute distress.   Neck: No JVD Cardiac: RRR, no murmurs, rubs, or gallops. Left radial cath site without hematoma.  Site covered with clean dressing. Respiratory: Mildly diminished breath sounds throughout without wheezes or crackles. GI: Soft, nontender, non-distended  MS: No edema  Assessment & Plan .     Coronary artery disease with unstable angina: Courtney Wilcox is doing well following PCI to severe ostial/proximal LAD stenosis yesterday. - Continue ASA and clopidogrel for at least 6 months.  Defer restarting apixaban given that it has been > 12 months since PE. -Continue atorvastatin 80 mg daily. -Outpatient cardiac rehab referral has been placed.  SVT: Frequent PSVT significantly improved with addition of oral amiodarone. - Complete amiodarone load as  follows: 400 mg bid x 7 days (9/21), 200 mg bid x 7 days (9/28), then 200 mg daily thereafter (10/5) -Continue diltiazem.  Chronic dyspnea and asthma: Overall improved.  Patient appears euvolemic and was noted to have normal LVEDP during dx cath last week (right heart cath could not be performed due to difficulty with venous access). -No need for standing diuresis. -Continue outpatient pulmonary follow-up.  Dispo: Patient is stable for d/c home today.  We will arrange for follow-up with me or an APP in 1-2 weeks.  For questions or updates, please contact [facility] Please consult [email or web address] for contact info under [facility].     Signed, Yvonne Kendall, MD

## 2022-10-03 NOTE — Hospital Course (Signed)
77yo with h/o SVT, depression, HTN, OSA, RA, and PE who presented on 9/11 with chest pain with runs of SVT, sent by cardiology. CTA negative for PE, has multiple thyroid nodules. She was recently diagnosed with a sinus infection and is being treated with Ceftin and prednisone. Cardiology was concerned for unstable angina; she underwent cardiac catheterization on 9/13 after Eliquis washout and was found to have severe one-vessel CAD of the LAD. She had a repeat cath on 9/16 with successful placement of DES to ostial/proximal LAD.  She will need DAPT for at least 6 months.

## 2022-10-03 NOTE — Discharge Summary (Signed)
Physician Discharge Summary   Patient: Courtney Wilcox MRN: [id number] DOB: 1945/02/16  Admit date:     09/27/2022  Discharge date: 10/03/22  Discharge Physician: Jonah Blue   PCP: Patrice Paradise, MD   Recommendations at discharge:   You had a stent placed and will need to take both aspirin and Plavix for at least 6 months Stop Eliquis for now Take high-dose Lipitor (atorvastatin) 80 mg daily  Follow up with cardiology in 1-2 weeks You have been referred for outpatient cardiac rehab You were started on Amiodarone - take as directed Citalopram was stopped because of amiodarone; talk with PCP about whether to change to an alternative medication for anxiety Thyroid ultrasound showed nodules that need biopsy; follow up with PCP to discuss Follow up with Dr. Merlinda Frederick in 1-2 weeks  Discharge Diagnoses: Principal Problem:   Unstable angina (HCC) Active Problems:   Rheumatoid arthritis (HCC)   Anemia of chronic disease   Eosinophilic asthma   Anxiety   Multiple thyroid nodules   PSVT (paroxysmal supraventricular tachycardia)   History of atrial flutter   History of pulmonary embolism   Class 1 obesity due to excess calories with body mass index (BMI) of 34.0 to 34.9 in adult   Chronic kidney disease, stage 3b (HCC)   GERD without esophagitis   Coronary artery disease involving native coronary artery of native heart   Hospital Course: 77yo with h/o SVT, depression, HTN, OSA, RA, and PE who presented on 9/11 with chest pain with runs of SVT, sent by cardiology. CTA negative for PE, has multiple thyroid nodules. She was recently diagnosed with a sinus infection and is being treated with Ceftin and prednisone. Cardiology was concerned for unstable angina; she underwent cardiac catheterization on 9/13 after Eliquis washout and was found to have severe one-vessel CAD of the LAD. She had a repeat cath on 9/16 with successful placement of DES to ostial/proximal LAD.  She will  need DAPT for at least 6 months.  Assessment and Plan: * Unstable angina [facility]) Presented to cardiology office with CP, noted to have runs of SVT Sent by cardiology for admission, likely to need cath Intermittent tachycardia with HR as high as 140-150 and improves with rest/deep breathing.  EKG shows HR of 94 and SR with frequent PAC.  CTA chest is negative, no evidence of PE or CAD Clinically improved with [place] O2 although not hypoxic Cardiology consulted Cardiac cath on 9/13 Last dose of Eliquis was 9/12 - will not restart at this time since it has been >12 months since PE per cardiology Stent placed on repeat cath on 9/16 Doing well, ok for dc to home  Needs DAPT for at least 6 months Needs ongoing high-dose Lipitor Referred for outpatient cardiac rehab  Rheumatoid arthritis (HCC) Continue leflunomide, Plaquenil, prednisone  Anemia of chronic disease Appears to be stable for now  GERD without esophagitis Continue Pepcid, Protonix  Chronic kidney disease, stage 3b (HCC) Appears to be stable at this time Attempt to avoid nephrotoxic medications  Class 1 obesity due to excess calories with body mass index (BMI) of 34.0 to 34.9 in adult Body mass index is 34.57 kg/m.Marland Kitchen  Weight loss should be encouraged Outpatient PCP/bariatric medicine f/u encouraged   History of pulmonary embolism Eliquis on hold for DAPT per cardiology 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 (last in 11/2020) - resume when done with DAPT (at least 6 months)  History of atrial flutter Also with h/o SVT Rate controlled with diltiazem  Physician Discharge Summary   Patient: Courtney Wilcox MRN: [id number] DOB: 1945/02/16  Admit date:     09/27/2022  Discharge date: 10/03/22  Discharge Physician: Jonah Blue   PCP: Patrice Paradise, MD   Recommendations at discharge:   You had a stent placed and will need to take both aspirin and Plavix for at least 6 months Stop Eliquis for now Take high-dose Lipitor (atorvastatin) 80 mg daily  Follow up with cardiology in 1-2 weeks You have been referred for outpatient cardiac rehab You were started on Amiodarone - take as directed Citalopram was stopped because of amiodarone; talk with PCP about whether to change to an alternative medication for anxiety Thyroid ultrasound showed nodules that need biopsy; follow up with PCP to discuss Follow up with Dr. Merlinda Frederick in 1-2 weeks  Discharge Diagnoses: Principal Problem:   Unstable angina (HCC) Active Problems:   Rheumatoid arthritis (HCC)   Anemia of chronic disease   Eosinophilic asthma   Anxiety   Multiple thyroid nodules   PSVT (paroxysmal supraventricular tachycardia)   History of atrial flutter   History of pulmonary embolism   Class 1 obesity due to excess calories with body mass index (BMI) of 34.0 to 34.9 in adult   Chronic kidney disease, stage 3b (HCC)   GERD without esophagitis   Coronary artery disease involving native coronary artery of native heart   Hospital Course: 77yo with h/o SVT, depression, HTN, OSA, RA, and PE who presented on 9/11 with chest pain with runs of SVT, sent by cardiology. CTA negative for PE, has multiple thyroid nodules. She was recently diagnosed with a sinus infection and is being treated with Ceftin and prednisone. Cardiology was concerned for unstable angina; she underwent cardiac catheterization on 9/13 after Eliquis washout and was found to have severe one-vessel CAD of the LAD. She had a repeat cath on 9/16 with successful placement of DES to ostial/proximal LAD.  She will  need DAPT for at least 6 months.  Assessment and Plan: * Unstable angina [facility]) Presented to cardiology office with CP, noted to have runs of SVT Sent by cardiology for admission, likely to need cath Intermittent tachycardia with HR as high as 140-150 and improves with rest/deep breathing.  EKG shows HR of 94 and SR with frequent PAC.  CTA chest is negative, no evidence of PE or CAD Clinically improved with [place] O2 although not hypoxic Cardiology consulted Cardiac cath on 9/13 Last dose of Eliquis was 9/12 - will not restart at this time since it has been >12 months since PE per cardiology Stent placed on repeat cath on 9/16 Doing well, ok for dc to home  Needs DAPT for at least 6 months Needs ongoing high-dose Lipitor Referred for outpatient cardiac rehab  Rheumatoid arthritis (HCC) Continue leflunomide, Plaquenil, prednisone  Anemia of chronic disease Appears to be stable for now  GERD without esophagitis Continue Pepcid, Protonix  Chronic kidney disease, stage 3b (HCC) Appears to be stable at this time Attempt to avoid nephrotoxic medications  Class 1 obesity due to excess calories with body mass index (BMI) of 34.0 to 34.9 in adult Body mass index is 34.57 kg/m.Marland Kitchen  Weight loss should be encouraged Outpatient PCP/bariatric medicine f/u encouraged   History of pulmonary embolism Eliquis on hold for DAPT per cardiology 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 (last in 11/2020) - resume when done with DAPT (at least 6 months)  History of atrial flutter Also with h/o SVT Rate controlled with diltiazem  Physician Discharge Summary   Patient: Courtney Wilcox MRN: [id number] DOB: 1945/02/16  Admit date:     09/27/2022  Discharge date: 10/03/22  Discharge Physician: Jonah Blue   PCP: Patrice Paradise, MD   Recommendations at discharge:   You had a stent placed and will need to take both aspirin and Plavix for at least 6 months Stop Eliquis for now Take high-dose Lipitor (atorvastatin) 80 mg daily  Follow up with cardiology in 1-2 weeks You have been referred for outpatient cardiac rehab You were started on Amiodarone - take as directed Citalopram was stopped because of amiodarone; talk with PCP about whether to change to an alternative medication for anxiety Thyroid ultrasound showed nodules that need biopsy; follow up with PCP to discuss Follow up with Dr. Merlinda Frederick in 1-2 weeks  Discharge Diagnoses: Principal Problem:   Unstable angina (HCC) Active Problems:   Rheumatoid arthritis (HCC)   Anemia of chronic disease   Eosinophilic asthma   Anxiety   Multiple thyroid nodules   PSVT (paroxysmal supraventricular tachycardia)   History of atrial flutter   History of pulmonary embolism   Class 1 obesity due to excess calories with body mass index (BMI) of 34.0 to 34.9 in adult   Chronic kidney disease, stage 3b (HCC)   GERD without esophagitis   Coronary artery disease involving native coronary artery of native heart   Hospital Course: 77yo with h/o SVT, depression, HTN, OSA, RA, and PE who presented on 9/11 with chest pain with runs of SVT, sent by cardiology. CTA negative for PE, has multiple thyroid nodules. She was recently diagnosed with a sinus infection and is being treated with Ceftin and prednisone. Cardiology was concerned for unstable angina; she underwent cardiac catheterization on 9/13 after Eliquis washout and was found to have severe one-vessel CAD of the LAD. She had a repeat cath on 9/16 with successful placement of DES to ostial/proximal LAD.  She will  need DAPT for at least 6 months.  Assessment and Plan: * Unstable angina [facility]) Presented to cardiology office with CP, noted to have runs of SVT Sent by cardiology for admission, likely to need cath Intermittent tachycardia with HR as high as 140-150 and improves with rest/deep breathing.  EKG shows HR of 94 and SR with frequent PAC.  CTA chest is negative, no evidence of PE or CAD Clinically improved with [place] O2 although not hypoxic Cardiology consulted Cardiac cath on 9/13 Last dose of Eliquis was 9/12 - will not restart at this time since it has been >12 months since PE per cardiology Stent placed on repeat cath on 9/16 Doing well, ok for dc to home  Needs DAPT for at least 6 months Needs ongoing high-dose Lipitor Referred for outpatient cardiac rehab  Rheumatoid arthritis (HCC) Continue leflunomide, Plaquenil, prednisone  Anemia of chronic disease Appears to be stable for now  GERD without esophagitis Continue Pepcid, Protonix  Chronic kidney disease, stage 3b (HCC) Appears to be stable at this time Attempt to avoid nephrotoxic medications  Class 1 obesity due to excess calories with body mass index (BMI) of 34.0 to 34.9 in adult Body mass index is 34.57 kg/m.Marland Kitchen  Weight loss should be encouraged Outpatient PCP/bariatric medicine f/u encouraged   History of pulmonary embolism Eliquis on hold for DAPT per cardiology 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 (last in 11/2020) - resume when done with DAPT (at least 6 months)  History of atrial flutter Also with h/o SVT Rate controlled with diltiazem  Physician Discharge Summary   Patient: Courtney Wilcox MRN: [id number] DOB: 1945/02/16  Admit date:     09/27/2022  Discharge date: 10/03/22  Discharge Physician: Jonah Blue   PCP: Patrice Paradise, MD   Recommendations at discharge:   You had a stent placed and will need to take both aspirin and Plavix for at least 6 months Stop Eliquis for now Take high-dose Lipitor (atorvastatin) 80 mg daily  Follow up with cardiology in 1-2 weeks You have been referred for outpatient cardiac rehab You were started on Amiodarone - take as directed Citalopram was stopped because of amiodarone; talk with PCP about whether to change to an alternative medication for anxiety Thyroid ultrasound showed nodules that need biopsy; follow up with PCP to discuss Follow up with Dr. Merlinda Frederick in 1-2 weeks  Discharge Diagnoses: Principal Problem:   Unstable angina (HCC) Active Problems:   Rheumatoid arthritis (HCC)   Anemia of chronic disease   Eosinophilic asthma   Anxiety   Multiple thyroid nodules   PSVT (paroxysmal supraventricular tachycardia)   History of atrial flutter   History of pulmonary embolism   Class 1 obesity due to excess calories with body mass index (BMI) of 34.0 to 34.9 in adult   Chronic kidney disease, stage 3b (HCC)   GERD without esophagitis   Coronary artery disease involving native coronary artery of native heart   Hospital Course: 77yo with h/o SVT, depression, HTN, OSA, RA, and PE who presented on 9/11 with chest pain with runs of SVT, sent by cardiology. CTA negative for PE, has multiple thyroid nodules. She was recently diagnosed with a sinus infection and is being treated with Ceftin and prednisone. Cardiology was concerned for unstable angina; she underwent cardiac catheterization on 9/13 after Eliquis washout and was found to have severe one-vessel CAD of the LAD. She had a repeat cath on 9/16 with successful placement of DES to ostial/proximal LAD.  She will  need DAPT for at least 6 months.  Assessment and Plan: * Unstable angina [facility]) Presented to cardiology office with CP, noted to have runs of SVT Sent by cardiology for admission, likely to need cath Intermittent tachycardia with HR as high as 140-150 and improves with rest/deep breathing.  EKG shows HR of 94 and SR with frequent PAC.  CTA chest is negative, no evidence of PE or CAD Clinically improved with [place] O2 although not hypoxic Cardiology consulted Cardiac cath on 9/13 Last dose of Eliquis was 9/12 - will not restart at this time since it has been >12 months since PE per cardiology Stent placed on repeat cath on 9/16 Doing well, ok for dc to home  Needs DAPT for at least 6 months Needs ongoing high-dose Lipitor Referred for outpatient cardiac rehab  Rheumatoid arthritis (HCC) Continue leflunomide, Plaquenil, prednisone  Anemia of chronic disease Appears to be stable for now  GERD without esophagitis Continue Pepcid, Protonix  Chronic kidney disease, stage 3b (HCC) Appears to be stable at this time Attempt to avoid nephrotoxic medications  Class 1 obesity due to excess calories with body mass index (BMI) of 34.0 to 34.9 in adult Body mass index is 34.57 kg/m.Marland Kitchen  Weight loss should be encouraged Outpatient PCP/bariatric medicine f/u encouraged   History of pulmonary embolism Eliquis on hold for DAPT per cardiology 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 (last in 11/2020) - resume when done with DAPT (at least 6 months)  History of atrial flutter Also with h/o SVT Rate controlled with diltiazem  Physician Discharge Summary   Patient: Courtney Wilcox MRN: [id number] DOB: 1945/02/16  Admit date:     09/27/2022  Discharge date: 10/03/22  Discharge Physician: Jonah Blue   PCP: Patrice Paradise, MD   Recommendations at discharge:   You had a stent placed and will need to take both aspirin and Plavix for at least 6 months Stop Eliquis for now Take high-dose Lipitor (atorvastatin) 80 mg daily  Follow up with cardiology in 1-2 weeks You have been referred for outpatient cardiac rehab You were started on Amiodarone - take as directed Citalopram was stopped because of amiodarone; talk with PCP about whether to change to an alternative medication for anxiety Thyroid ultrasound showed nodules that need biopsy; follow up with PCP to discuss Follow up with Dr. Merlinda Frederick in 1-2 weeks  Discharge Diagnoses: Principal Problem:   Unstable angina (HCC) Active Problems:   Rheumatoid arthritis (HCC)   Anemia of chronic disease   Eosinophilic asthma   Anxiety   Multiple thyroid nodules   PSVT (paroxysmal supraventricular tachycardia)   History of atrial flutter   History of pulmonary embolism   Class 1 obesity due to excess calories with body mass index (BMI) of 34.0 to 34.9 in adult   Chronic kidney disease, stage 3b (HCC)   GERD without esophagitis   Coronary artery disease involving native coronary artery of native heart   Hospital Course: 77yo with h/o SVT, depression, HTN, OSA, RA, and PE who presented on 9/11 with chest pain with runs of SVT, sent by cardiology. CTA negative for PE, has multiple thyroid nodules. She was recently diagnosed with a sinus infection and is being treated with Ceftin and prednisone. Cardiology was concerned for unstable angina; she underwent cardiac catheterization on 9/13 after Eliquis washout and was found to have severe one-vessel CAD of the LAD. She had a repeat cath on 9/16 with successful placement of DES to ostial/proximal LAD.  She will  need DAPT for at least 6 months.  Assessment and Plan: * Unstable angina [facility]) Presented to cardiology office with CP, noted to have runs of SVT Sent by cardiology for admission, likely to need cath Intermittent tachycardia with HR as high as 140-150 and improves with rest/deep breathing.  EKG shows HR of 94 and SR with frequent PAC.  CTA chest is negative, no evidence of PE or CAD Clinically improved with [place] O2 although not hypoxic Cardiology consulted Cardiac cath on 9/13 Last dose of Eliquis was 9/12 - will not restart at this time since it has been >12 months since PE per cardiology Stent placed on repeat cath on 9/16 Doing well, ok for dc to home  Needs DAPT for at least 6 months Needs ongoing high-dose Lipitor Referred for outpatient cardiac rehab  Rheumatoid arthritis (HCC) Continue leflunomide, Plaquenil, prednisone  Anemia of chronic disease Appears to be stable for now  GERD without esophagitis Continue Pepcid, Protonix  Chronic kidney disease, stage 3b (HCC) Appears to be stable at this time Attempt to avoid nephrotoxic medications  Class 1 obesity due to excess calories with body mass index (BMI) of 34.0 to 34.9 in adult Body mass index is 34.57 kg/m.Marland Kitchen  Weight loss should be encouraged Outpatient PCP/bariatric medicine f/u encouraged   History of pulmonary embolism Eliquis on hold for DAPT per cardiology 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  Normal respiratory effort. Abdomen:  soft, NT, ND Skin:  no rash or induration seen on limited exam Musculoskeletal:  grossly normal tone BUE/BLE, good ROM, no bony abnormality Psychiatric:  blunted mood and affect, speech fluent and appropriate, AOx3 Neurologic:  CN 2-12 grossly intact, moves all extremities in coordinated fashion  Data Reviewed: I have reviewed the patient's lab results since admission.  Pertinent labs for today include:  BUN 19/Creatinine 1.62/GFR 33 - stable WBC 7.1 Hgb 9.5 Platelets 126     Condition at discharge: stable  The results of significant diagnostics from this hospitalization (including imaging, microbiology, ancillary and laboratory) are listed below for reference.   Imaging Studies: CARDIAC CATHETERIZATION  Result Date: 10/02/2022   Prox LAD lesion is 85% stenosed.   Prox LAD to Mid LAD lesion is 50% stenosed.   Prox RCA lesion is 30% stenosed.   A drug-eluting stent was successfully placed using a STENT ONYX FRONTIER [id number].   Post intervention, there is a 10% residual stenosis.   Post intervention, there is a 0% residual stenosis. Successful drug-eluting stent placement to the ostial/proximal LAD.  The lesion is very eccentric and fibrotic in the proximal segment and thus there was a 10% residual stenosis and spite of high-pressure noncompliant balloon. Recommendations: Dual antiplatelet therapy for at least 6 months. Aggressive treatment of risk factors. Hydrate overnight and check renal function in the morning.  68 mL of  contrast was used for the procedure.   CARDIAC CATHETERIZATION  Result Date: 09/29/2022   Prox RCA lesion is 30% stenosed.   Prox LAD lesion is 85% stenosed.   Prox LAD to Mid LAD lesion is 50% stenosed. 1.  Severe one-vessel coronary artery disease involving the ostial/proximal LAD 2.  Left ventricular angiography was not performed.  EF was normal by echo.  Normal left ventricular end-diastolic pressure. 3.  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 4.  Frequent supraventricular tachycardia throughout the case which made angiography difficult. 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  In addition, SVT has to be well-controlled before the procedure and thus I elected to start the patient on oral amiodarone as we are not able to increase diltiazem due to resting bradycardia. Given that the patient's pulmonary embolism was more than a year ago and she will require dual antiplatelet therapy, I favor not resuming Eliquis especially that recent CTA showed no evidence of pulmonary embolism.  I started aspirin and clopidogrel. The echo from today did not show evidence of pulmonary hypertension and thus no need to attempt right heart catheterization at this time.   ECHOCARDIOGRAM COMPLETE  Result Date: 09/29/2022    ECHOCARDIOGRAM REPORT   Patient Name:   TANNIS DYMENT Date of Exam: 09/29/2022 Medical Rec #:  [id number]         Height:       62.0 in Accession #:    [id number]        Weight:       189.0 lb Date of Birth:  [birth year]         BSA:          1.866 m Patient Age:    77 years          BP:  Physician Discharge Summary   Patient: Courtney Wilcox MRN: [id number] DOB: 1945/02/16  Admit date:     09/27/2022  Discharge date: 10/03/22  Discharge Physician: Jonah Blue   PCP: Patrice Paradise, MD   Recommendations at discharge:   You had a stent placed and will need to take both aspirin and Plavix for at least 6 months Stop Eliquis for now Take high-dose Lipitor (atorvastatin) 80 mg daily  Follow up with cardiology in 1-2 weeks You have been referred for outpatient cardiac rehab You were started on Amiodarone - take as directed Citalopram was stopped because of amiodarone; talk with PCP about whether to change to an alternative medication for anxiety Thyroid ultrasound showed nodules that need biopsy; follow up with PCP to discuss Follow up with Dr. Merlinda Frederick in 1-2 weeks  Discharge Diagnoses: Principal Problem:   Unstable angina (HCC) Active Problems:   Rheumatoid arthritis (HCC)   Anemia of chronic disease   Eosinophilic asthma   Anxiety   Multiple thyroid nodules   PSVT (paroxysmal supraventricular tachycardia)   History of atrial flutter   History of pulmonary embolism   Class 1 obesity due to excess calories with body mass index (BMI) of 34.0 to 34.9 in adult   Chronic kidney disease, stage 3b (HCC)   GERD without esophagitis   Coronary artery disease involving native coronary artery of native heart   Hospital Course: 77yo with h/o SVT, depression, HTN, OSA, RA, and PE who presented on 9/11 with chest pain with runs of SVT, sent by cardiology. CTA negative for PE, has multiple thyroid nodules. She was recently diagnosed with a sinus infection and is being treated with Ceftin and prednisone. Cardiology was concerned for unstable angina; she underwent cardiac catheterization on 9/13 after Eliquis washout and was found to have severe one-vessel CAD of the LAD. She had a repeat cath on 9/16 with successful placement of DES to ostial/proximal LAD.  She will  need DAPT for at least 6 months.  Assessment and Plan: * Unstable angina [facility]) Presented to cardiology office with CP, noted to have runs of SVT Sent by cardiology for admission, likely to need cath Intermittent tachycardia with HR as high as 140-150 and improves with rest/deep breathing.  EKG shows HR of 94 and SR with frequent PAC.  CTA chest is negative, no evidence of PE or CAD Clinically improved with [place] O2 although not hypoxic Cardiology consulted Cardiac cath on 9/13 Last dose of Eliquis was 9/12 - will not restart at this time since it has been >12 months since PE per cardiology Stent placed on repeat cath on 9/16 Doing well, ok for dc to home  Needs DAPT for at least 6 months Needs ongoing high-dose Lipitor Referred for outpatient cardiac rehab  Rheumatoid arthritis (HCC) Continue leflunomide, Plaquenil, prednisone  Anemia of chronic disease Appears to be stable for now  GERD without esophagitis Continue Pepcid, Protonix  Chronic kidney disease, stage 3b (HCC) Appears to be stable at this time Attempt to avoid nephrotoxic medications  Class 1 obesity due to excess calories with body mass index (BMI) of 34.0 to 34.9 in adult Body mass index is 34.57 kg/m.Marland Kitchen  Weight loss should be encouraged Outpatient PCP/bariatric medicine f/u encouraged   History of pulmonary embolism Eliquis on hold for DAPT per cardiology 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  Normal respiratory effort. Abdomen:  soft, NT, ND Skin:  no rash or induration seen on limited exam Musculoskeletal:  grossly normal tone BUE/BLE, good ROM, no bony abnormality Psychiatric:  blunted mood and affect, speech fluent and appropriate, AOx3 Neurologic:  CN 2-12 grossly intact, moves all extremities in coordinated fashion  Data Reviewed: I have reviewed the patient's lab results since admission.  Pertinent labs for today include:  BUN 19/Creatinine 1.62/GFR 33 - stable WBC 7.1 Hgb 9.5 Platelets 126     Condition at discharge: stable  The results of significant diagnostics from this hospitalization (including imaging, microbiology, ancillary and laboratory) are listed below for reference.   Imaging Studies: CARDIAC CATHETERIZATION  Result Date: 10/02/2022   Prox LAD lesion is 85% stenosed.   Prox LAD to Mid LAD lesion is 50% stenosed.   Prox RCA lesion is 30% stenosed.   A drug-eluting stent was successfully placed using a STENT ONYX FRONTIER 3.5X26.   Post intervention, there is a 10% residual stenosis.   Post intervention, there is a 0% residual stenosis. Successful drug-eluting stent placement to the ostial/proximal LAD.  The lesion is very eccentric and fibrotic in the proximal segment and thus there was a 10% residual stenosis and spite of high-pressure noncompliant balloon. Recommendations: Dual antiplatelet therapy for at least 6 months. Aggressive treatment of risk factors. Hydrate overnight and check renal function in the morning.  68 mL of  contrast was used for the procedure.   CARDIAC CATHETERIZATION  Result Date: 09/29/2022   Prox RCA lesion is 30% stenosed.   Prox LAD lesion is 85% stenosed.   Prox LAD to Mid LAD lesion is 50% stenosed. 1.  Severe one-vessel coronary artery disease involving the ostial/proximal LAD 2.  Left ventricular angiography was not performed.  EF was normal by echo.  Normal left ventricular end-diastolic pressure. 3.  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 4.  Frequent supraventricular tachycardia throughout the case which made angiography difficult. 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  In addition, SVT has to be well-controlled before the procedure and thus I elected to start the patient on oral amiodarone as we are not able to increase diltiazem due to resting bradycardia. Given that the patient's pulmonary embolism was more than a year ago and she will require dual antiplatelet therapy, I favor not resuming Eliquis especially that recent CTA showed no evidence of pulmonary embolism.  I started aspirin and clopidogrel. The echo from today did not show evidence of pulmonary hypertension and thus no need to attempt right heart catheterization at this time.   ECHOCARDIOGRAM COMPLETE  Result Date: 09/29/2022    ECHOCARDIOGRAM REPORT   Patient Name:   TANNIS DYMENT Date of Exam: 09/29/2022 Medical Rec #:  [id number]         Height:       62.0 in Accession #:    [id number]        Weight:       189.0 lb Date of Birth:  [birth year]         BSA:          1.866 m Patient Age:    77 years          BP:

## 2022-10-04 ENCOUNTER — Encounter: Payer: Self-pay | Admitting: Oncology

## 2022-10-09 ENCOUNTER — Encounter: Payer: Self-pay | Admitting: Internal Medicine

## 2022-10-10 ENCOUNTER — Telehealth: Payer: Self-pay | Admitting: Internal Medicine

## 2022-10-10 DIAGNOSIS — I471 Supraventricular tachycardia, unspecified: Secondary | ICD-10-CM

## 2022-10-10 DIAGNOSIS — R5383 Other fatigue: Secondary | ICD-10-CM

## 2022-10-10 DIAGNOSIS — R079 Chest pain, unspecified: Secondary | ICD-10-CM

## 2022-10-10 NOTE — Telephone Encounter (Signed)
Daughter Lyla Son) stated patient had stent placed about a week ago and has been feeling very lethargic and nauseous.  Daughter stated patient stopped her anxiety medication and wants to know if patient can start back and next steps.

## 2022-10-10 NOTE — Telephone Encounter (Signed)
Symptoms are nonspecific and could be due to a number of factors.  I recommend that she decrease amiodarone to 200 mg daily but otherwise continue her current medications.  We should have her come in for a CBC, CMP, and total CK as soon as possible.  Yvonne Kendall, MD [facility]

## 2022-10-11 NOTE — Telephone Encounter (Signed)
Spoke to patient's daughter and informed her of the following response from the provider as follows:  "Symptoms are nonspecific and could be due to a number of factors.  I recommend that she decrease amiodarone to 200 mg daily but otherwise continue her current medications.  We should have her come in for a CBC, CMP, and total CK as soon as possible.   Yvonne Kendall, MD [facility]"  Patient's daughter understood and stated that they would come in for labs to be drawn today

## 2022-10-11 NOTE — Telephone Encounter (Signed)
Left message to call back  

## 2022-10-12 ENCOUNTER — Other Ambulatory Visit: Payer: Self-pay

## 2022-10-12 DIAGNOSIS — Z79899 Other long term (current) drug therapy: Secondary | ICD-10-CM

## 2022-10-12 LAB — COMPREHENSIVE METABOLIC PANEL
ALT: 11 IU/L (ref 0–32)
AST: 15 IU/L (ref 0–40)
Albumin: 3.5 g/dL — ABNORMAL LOW (ref 3.8–4.8)
Alkaline Phosphatase: 85 IU/L (ref 44–121)
BUN/Creatinine Ratio: 7 — ABNORMAL LOW (ref 12–28)
BUN: 16 mg/dL (ref 8–27)
Bilirubin Total: 0.4 mg/dL (ref 0.0–1.2)
CO2: 21 mmol/L (ref 20–29)
Calcium: 8.6 mg/dL — ABNORMAL LOW (ref 8.7–10.3)
Chloride: 104 mmol/L (ref 96–106)
Creatinine, Ser: 2.23 mg/dL — ABNORMAL HIGH (ref 0.57–1.00)
Globulin, Total: 2 g/dL (ref 1.5–4.5)
Glucose: 102 mg/dL — ABNORMAL HIGH (ref 70–99)
Potassium: 3.6 mmol/L (ref 3.5–5.2)
Sodium: 140 mmol/L (ref 134–144)
Total Protein: 5.5 g/dL — ABNORMAL LOW (ref 6.0–8.5)
eGFR: 22 mL/min/{1.73_m2} — ABNORMAL LOW (ref >59)

## 2022-10-12 LAB — CBC
Hematocrit: 29.7 % — ABNORMAL LOW (ref 34.0–46.6)
Hemoglobin: 9.7 g/dL — ABNORMAL LOW (ref 11.1–15.9)
MCH: 33.6 pg — ABNORMAL HIGH (ref 26.6–33.0)
MCHC: 32.7 g/dL (ref 31.5–35.7)
MCV: 103 fL — ABNORMAL HIGH (ref 79–97)
Platelets: 198 10*3/uL (ref 150–450)
RBC: 2.89 x10E6/uL — ABNORMAL LOW (ref 3.77–5.28)
RDW: 12.6 % (ref 11.7–15.4)
WBC: 8.2 10*3/uL (ref 3.4–10.8)

## 2022-10-12 LAB — CK TOTAL AND CKMB (NOT AT ARMC)
CK-MB Index: 1.6 ng/mL (ref 0.0–5.3)
Total CK: 102 U/L (ref 32–182)

## 2022-10-16 ENCOUNTER — Other Ambulatory Visit: Payer: Self-pay

## 2022-10-16 DIAGNOSIS — Z79899 Other long term (current) drug therapy: Secondary | ICD-10-CM

## 2022-10-17 LAB — BASIC METABOLIC PANEL
BUN/Creatinine Ratio: 6 — ABNORMAL LOW (ref 12–28)
BUN: 12 mg/dL (ref 8–27)
CO2: 23 mmol/L (ref 20–29)
Calcium: 9 mg/dL (ref 8.7–10.3)
Chloride: 104 mmol/L (ref 96–106)
Creatinine, Ser: 2.09 mg/dL — ABNORMAL HIGH (ref 0.57–1.00)
Glucose: 89 mg/dL (ref 70–99)
Potassium: 4 mmol/L (ref 3.5–5.2)
Sodium: 141 mmol/L (ref 134–144)
eGFR: 24 mL/min/{1.73_m2} — ABNORMAL LOW (ref >59)

## 2022-10-19 ENCOUNTER — Ambulatory Visit: Payer: Medicare Other | Attending: Nurse Practitioner | Admitting: Nurse Practitioner

## 2022-10-19 ENCOUNTER — Encounter: Payer: Self-pay | Admitting: Nurse Practitioner

## 2022-10-19 VITALS — BP 122/69 | HR 71 | Ht 63.0 in | Wt 183.4 lb

## 2022-10-19 DIAGNOSIS — N183 Chronic kidney disease, stage 3 unspecified: Secondary | ICD-10-CM

## 2022-10-19 DIAGNOSIS — I471 Supraventricular tachycardia, unspecified: Secondary | ICD-10-CM

## 2022-10-19 DIAGNOSIS — I2510 Atherosclerotic heart disease of native coronary artery without angina pectoris: Secondary | ICD-10-CM | POA: Diagnosis present

## 2022-10-19 DIAGNOSIS — E785 Hyperlipidemia, unspecified: Secondary | ICD-10-CM

## 2022-10-19 DIAGNOSIS — J45909 Unspecified asthma, uncomplicated: Secondary | ICD-10-CM

## 2022-10-19 DIAGNOSIS — I4719 Other supraventricular tachycardia: Secondary | ICD-10-CM | POA: Diagnosis present

## 2022-10-19 DIAGNOSIS — Z79899 Other long term (current) drug therapy: Secondary | ICD-10-CM

## 2022-10-19 DIAGNOSIS — R5381 Other malaise: Secondary | ICD-10-CM | POA: Diagnosis present

## 2022-10-19 NOTE — Progress Notes (Signed)
Office Visit    Patient Name: Courtney Wilcox Date of Encounter: 10/19/2022  Primary Care Provider:  Patrice Paradise, MD Primary Cardiologist:  Yvonne Kendall, MD  Chief Complaint    77 y.o. female with a history of, PSVT, PE (November 2022), diastolic dysfunction, hyperlipidemia, stage III chronic kidney disease, asthma w/ post-COVID cough, B12 deficiency macrocytic anemia, depression, anxiety and rheumatoid arthritis, who presents for follow-up after recent percutaneous intervention.  Past Medical History    Past Medical History:  Diagnosis Date   Allergic rhinitis    Anxiety    Arthritis    Asthma    Atrophic vaginitis    B12 deficiency    Bronchitis, chronic (HCC)    CAD (coronary artery disease)    a. 03/2003 Cath: Nl cors; b. 09/2022 Staged PCI: LM nl, LAD 85p/41m (3.5x26 Onyx Frontier DES), RI small, mild dzs, LCX nl, OM2 mild dzs, RCA 30p.   Cervical disc disease    CKD (chronic kidney disease), stage III (HCC)    Collagen vascular disease (HCC)    RA   Cough - post-COVID    Depression    Diastolic dysfunction    a. 11/2020 Echo: EF 45-50%, GrI DD; b. 10/2021 Echo: EF 60-65%, GrII DD; c. 09/2022 Echo: EF 55-60%, no rwma, GrI DD, nl RV fxn, mildly dil LA. No significant valvular dzs.   Fibrocystic breast disease    Foot ulcer (HCC)    GERD (gastroesophageal reflux disease)    Hammer toe    Hyperlipidemia    IBS (irritable bowel syndrome)    Macrocytic anemia    Macular degeneration 2023   Obesity    OSA (obstructive sleep apnea)    Osteoarthrosis    Multi site   Osteopenia    PSVT (paroxysmal supraventricular tachycardia) (HCC)    a. 04/2022 Zio: Sinus rhythm @ 57 (42-101). Rare PACs/PVCs. 29528 SVT runs lasting up to 12m 12sec - max rate 222. 5 beats NSVT. Triggered events = sinus, SVT, PACs.   Rheumatoid arthritis (HCC)    Shingles    Thyroid nodule    MULTIPLE   Tonsillitis    recurrent   Umbilical hernia    Past Surgical History:   Procedure Laterality Date   BACK SURGERY  2011   LAMINECTOMY   BREAST BIOPSY Right    neg   CERVICAL FUSION  2011, 2012   X 2   COLONOSCOPY  2005, 2015   COLONOSCOPY WITH PROPOFOL N/A 08/04/2020   Procedure: COLONOSCOPY WITH PROPOFOL;  Surgeon: Earline Mayotte, MD;  Location: [facility] ENDOSCOPY;  Service: Endoscopy;  Laterality: N/A;   CORONARY STENT INTERVENTION N/A 10/02/2022   Procedure: CORONARY STENT INTERVENTION;  Surgeon: Iran Ouch, MD;  Location: [facility] INVASIVE CV LAB;  Service: Cardiovascular;  Laterality: N/A;   ESOPHAGOGASTRODUODENOSCOPY (EGD) WITH PROPOFOL N/A 09/26/2021   Procedure: ESOPHAGOGASTRODUODENOSCOPY (EGD) WITH PROPOFOL;  Surgeon: Jaynie Collins, DO;  Location: [facility]COPY;  Service: Gastroenterology;  Laterality: N/A;   ESOPHAGOGASTRODUODENOSCOPY ENDOSCOPY  2005, 2013, 2015   EYE SURGERY Bilateral    Cataract Extraction with IOL    HERNIA REPAIR     JOINT REPLACEMENT     KNEE ARTHROPLASTY Left 07/12/2016   Procedure: COMPUTER ASSISTED TOTAL KNEE ARTHROPLASTY;  Surgeon: Donato Heinz, MD;  Location: [facility] ORS;  Service: Orthopedics;  Laterality: Left;   Laryngeal tear after intubation w/repair     LUMBAR DISC SURGERY  12/2009   REPLACEMENT TOTAL KNEE Right 06/2007   DR.  HOOTEN, [facility]   REPLACEMENT UNICONDYLAR JOINT KNEE     DR. HOOTEN, [facility]   RIGHT/LEFT HEART CATH AND CORONARY ANGIOGRAPHY N/A 09/29/2022   Procedure: RIGHT/LEFT HEART CATH AND CORONARY ANGIOGRAPHY;  Surgeon: Iran Ouch, MD;  Location: [facility] INVASIVE CV LAB;  Service: Cardiovascular;  Laterality: N/A;   SHOULDER ARTHROSCOPY WITH ROTATOR CUFF REPAIR Right    TONSILLECTOMY     TOTAL HIP ARTHROPLASTY Right 01/31/2017   Procedure: TOTAL HIP ARTHROPLASTY;  Surgeon: Donato Heinz, MD;  Location: [facility] ORS;  Service: Orthopedics;  Laterality: Right;   TUBAL LIGATION     UPPER GI ENDOSCOPY      Allergies  Allergies  Allergen Reactions   Lactose Other (See Comments) and Diarrhea    Other Other (See Comments)    Pt ONLY tolerates SLOW-FE (slow released iron)---upsets IBS   Biaxin [Clarithromycin] Other (See Comments)    GI upset    Hydrocodone-Acetaminophen Itching    Tolerates acetaminophen    Oxycodone Itching   Sulfa Antibiotics Other (See Comments) and Diarrhea    GI upset  GI upset GI upset    Sulfasalazine Diarrhea    History of Present Illness      77 y.o. y/o female with a history of, PSVT, PE (November 2022), diastolic dysfunction, hyperlipidemia stage III chronic kidney disease, asthma w/ post-COVID cough, B12 deficiency macrocytic anemia, depression, anxiety and rheumatoid arthritis.  She previously underwent diagnostic catheterization 2005, which showed normal coronary arteries.  More recently, she was seen by Dr. Lalla Brothers in June 2023 in the setting of PSVT, which was felt to be atrial tachycardia.  She was switched from short-acting to long-acting metoprolol with subsequent plan to place a loop monitor to assess overall SVT burden.  This was subsequently deferred due to perceived low burden which was felt to be a type contributing to her baseline dyspnea.  Echo in October 2023 showed EF 60 to 65% with grade 2 diastolic dysfunction and mild to moderate mitral regurgitation.  She has also been followed by [facility] pulmonology in the setting of persistent dyspnea, asthma, post-COVID cough, and history of PE on oral anticoagulation.  Ms. Duren established general cardiology care with Dr. Okey Dupre in January 2024.  At that time, she was having ongoing dyspnea and intermittent chest discomfort.  It was felt that if her symptoms didn't improve following pulmonology medical interventions, or iron infusions in the setting of ongoing anemia, diagnostic catheterization may become appropriate.  At April 2024 follow-up, she was noted to have recurrent SVT.  14-day ZIO monitor was placed and subsequently showed 25,700 SVT runs lasting up to 40 minutes at a maximum rate of 222  bpm.  Average resting rate was 57.  She was reevaluated by electrophysiology and switched from Toprol-XL to diltiazem, which was subsequently titrated to 180 mg daily due to ongoing tachycardia, w/o significant improvement in overall burden when compared to toprol xl therapy.  At September 27, 2022 cardiology follow-up, Ms. Tobey c/o or dyspnea substernal chest tightness.  She was referred to the emergency department due to active chest pain, and ruled out for MI.  She underwent diagnostic catheterization on September 13, which showed severe proximal LAD disease.  She had frequent runs of SVT during the procedure, which made angiography difficult, she was placed on amiodarone and subsequently underwent staged PCI drug-eluting stent placement of the proximal and mid LAD on September 16.  Following initiation of amiodarone, she had significant improvement in SVT burden and she was discharged home on  oral amiodarone therapy as well as dual antiplatelet therapy.  Eliquis was not resumed as it had been greater than 12 months since PE.   Following discharge, the patient's daughter contacted our office related to nausea and lethargy.  She was advised to reduce amiodarone to 200 mg daily.  Follow-up lab work on September 25 showed slight rise in creatinine to 2.23 with normal LFTs and stable, macrocytic anemia (9.7/29.7).  She was encouraged to increase hydration and follow-up lab work on September 30 showed stable creatinine at 2.09.  Though she has not had any chest pain or dyspnea since her PCI, she has continued to have nausea and anorexia since discharge.  This has not been significantly helped by reducing amiodarone.  She notes that when she gets up and walks around, she just has very little energy and just wants to sit back down.  She has not had very much appetite and her daughter indicates that she has not been eating and drinking normally.  She denies palpitations, PND, orthopnea, dizziness, syncope, edema.   She is interested in coming off of amiodarone.  Home Medications    Current Outpatient Medications  Medication Sig Dispense Refill   acetaminophen (TYLENOL) 500 MG tablet Take 1,000 mg by mouth 3 (three) times daily as needed for mild pain.      ALPRAZolam (XANAX) 0.5 MG tablet Take 1 tablet (0.5 mg total) by mouth at bedtime as needed for sleep or anxiety. 30 tablet 0   aspirin 81 MG chewable tablet Chew 1 tablet (81 mg total) by mouth daily. 30 tablet 1   atorvastatin (LIPITOR) 80 MG tablet Take 1 tablet (80 mg total) by mouth daily. 30 tablet 1   Cholecalciferol (VITAMIN D) 2000 units tablet Take 2,000 Units by mouth daily.     clopidogrel (PLAVIX) 75 MG tablet Take 1 tablet (75 mg total) by mouth daily with breakfast. 30 tablet 1   Cyanocobalamin 1000 MCG/ML KIT Inject as directed every 30 (thirty) days.     Dupilumab 300 MG/2ML SOPN Inject 4 mLs into the skin every 14 (fourteen) days.     EPINEPHrine 0.3 mg/0.3 mL IJ SOAJ injection Inject 0.3 mg into the muscle as needed.     famotidine (PEPCID) 20 MG tablet Take 20 mg by mouth daily as needed.     hydroxychloroquine (PLAQUENIL) 200 MG tablet Take 1 tablet (200 mg total) by mouth daily. 1 tablet 0   leflunomide (ARAVA) 20 MG tablet Take 10 mg by mouth daily.     levalbuterol (XOPENEX) 0.63 MG/3ML nebulizer solution Take 0.63 mg by nebulization every 6 (six) hours as needed for wheezing.     meclizine (ANTIVERT) 12.5 MG tablet PLEASE SEE ATTACHED FOR DETAILED DIRECTIONS     montelukast (SINGULAIR) 10 MG tablet Take 10 mg by mouth daily as needed (for allergies.).      nystatin (MYCOSTATIN) 100000 UNIT/ML suspension Take 5 mLs by mouth as needed.     pantoprazole (PROTONIX) 40 MG tablet Take 1 tablet (40 mg total) by mouth 2 (two) times daily. 60 tablet 1   predniSONE (DELTASONE) 2.5 MG tablet Take 1 tablet (2.5 mg total) by mouth daily with breakfast. 30 tablet 0   sertraline (ZOLOFT) 25 MG tablet Take 1 tablet by mouth daily.      Simethicone 180 MG CAPS Take 1-2 capsules by mouth as needed.     triamcinolone (NASACORT) 55 MCG/ACT AERO nasal inhaler Place 2 sprays into the nose daily.     ZENPEP  25000-79000 units CPEP Take by mouth. Occasionally takes with a large meal     diltiazem (CARDIZEM CD) 180 MG 24 hr capsule Take 1 capsule (180 mg total) by mouth daily. 90 capsule 3   No current facility-administered medications for this visit.     Review of Systems    Lethargy, fatigue, anorexia, and nausea since discharge.  She denies chest pain, dyspnea, palpitations, PND, orthopnea, dizziness, syncope, edema.  All other systems reviewed and are otherwise negative except as noted above.   Cardiac Rehabilitation Eligibility Assessment  The patient is ready to start cardiac rehabilitation from a cardiac standpoint.    Physical Exam    VS:  BP 122/69 (BP Location: Right Arm, Patient Position: Sitting, Cuff Size: Normal) Comment (BP Location): forearm  Pulse 71   Ht 5\' 3"  (1.6 m)   Wt 183 lb 6.4 oz (83.2 kg)   SpO2 99%   BMI 32.49 kg/m  , BMI Body mass index is 32.49 kg/m.     GEN: Well nourished, well developed, in no acute distress. HEENT: normal. Neck: Supple, no JVD, carotid bruits, or masses. Cardiac: RRR, no murmurs, rubs, or gallops. No clubbing, cyanosis, edema.  Radials 2+/PT 2+ and equal bilaterally.  Respiratory:  Respirations regular and unlabored, rhonchi at the bases, otherwise clear to auscultation. GI: Soft, nontender, nondistended, BS + x 4. MS: no deformity or atrophy. Skin: warm and dry, no rash. Neuro:  Strength and sensation are intact. Psych: Normal affect.  Accessory Clinical Findings    ECG personally reviewed by me today - EKG Interpretation Date/Time:  Thursday October 19 2022 15:20:30 EDT Ventricular Rate:  71 PR Interval:  160 QRS Duration:  104 QT Interval:  452 QTC Calculation: 491 R Axis:   -24  Text Interpretation: Sinus rhythm with Premature supraventricular complexes  Confirmed by Nicolasa Ducking [phone]) on 10/19/2022 3:36:07 PM  - no acute changes.  Lab Results  Component Value Date   WBC 8.2 10/11/2022   HGB 9.7 (L) 10/11/2022   HCT 29.7 (L) 10/11/2022   MCV 103 (H) 10/11/2022   PLT 198 10/11/2022   Lab Results  Component Value Date   CREATININE 2.09 (H) 10/16/2022   BUN 12 10/16/2022   NA 141 10/16/2022   K 4.0 10/16/2022   CL 104 10/16/2022   CO2 23 10/16/2022   Lab Results  Component Value Date   ALT 11 10/11/2022   AST 15 10/11/2022   ALKPHOS 85 10/11/2022   BILITOT 0.4 10/11/2022   Assessment & Plan    1.  Coronary artery disease: Status post recent admission in the setting of unstable angina with normal troponins.  Diagnostic catheterization revealed severe proximal LAD disease, which was successfully treated with drug-eluting stent.  Hospitalization somewhat complicated by SVT requiring initiation of amiodarone.  She has not been having any chest pain or dyspnea since her discharge, which is a significant improvement compared to prior to hospitalization however, she has been experiencing fatigue, malaise, nausea, and anorexia, which is suspected secondary to amiodarone (see below).  Continue aspirin, statin, and plavix.  2.  Fatigue/malaise/nausea/anorexia: Ongoing since hospitalization despite reduction in amiodarone dose.  Labwork recently notable for relatively stable macrocytic anemia, and mild acute kidney injury.  They have been trying better to hydrate and creatinine slightly improved to 0.09 but not quite back at prior baseline.  I suspect symptoms are a side effect to amiodarone and we will discontinue.  3.  PSVT/atrial tachycardia: Previously evaluated by EP and SVT felt  to represent atrial tachycardia.  She was switched from metoprolol to diltiazem in the setting of prior history of asthma.  During recent hospitalization, she was having frequent runs of SVT/atrial tachycardia and was placed on amiodarone however this is  resulted in malaise, nausea, and anorexia.  Discontinuing amiodarone today.  Continue diltiazem.  She has follow-up with electrophysiology in 2 weeks to reevaluate symptoms, rhythm, and potential need for alternate antiarrhythmic.  She is unaware of any palpitations since discharge and is in sinus rhythm today.  4.  Hyperlipidemia: LDL 116 in August.  She is now on atorvastatin.  Will look to follow-up fasting lipids at next general cardiology follow-up in approximately 6 weeks.  If symptoms of fatigue/malaise do not resolve with discontinuation of amiodarone, will need to consider statin holiday.  5.  Stage III chronic kidney disease: With recent mild acute kidney injury in the setting of anorexia and nausea.  Discontinuing amiodarone, probably improve appetite.  Encouraged adequate hydration.  She is followed by nephrology locally.  6.  Macrocytic anemia: Followed by hematology and had been receiving outpatient iron infusions.  She had to skip treatment during hospitalization.  Patient will reach back out to hematology to reschedule.  Certainly anemia may be playing a role in her fatigue though she has been relatively stable with an H&H of 9.7 and 29.7.  7.  Asthma: Followed by pulmonology.  Dyspnea has improved significantly following PCI.  No active wheezing on exam today.  8.  Disposition: Follow-up as planned with the EP in approximately 2 weeks.  Follow-up with general cardiology in approximately 6 weeks with plan for repeat lipids at that time.  Nicolasa Ducking, NP 10/19/2022, 4:55 PM

## 2022-10-19 NOTE — Patient Instructions (Signed)
Medication Instructions:  STOP the Amiodarone  *If you need a refill on your cardiac medications before your next appointment, please call your pharmacy*   Lab Work: None ordered If you have labs (blood work) drawn today and your tests are completely normal, you will receive your results only by: MyChart Message (if you have MyChart) OR A paper copy in the mail If you have any lab test that is abnormal or we need to change your treatment, we will call you to review the results.   Testing/Procedures: None ordered   Follow-Up: At [facility], you and your health needs are our priority.  As part of our continuing mission to provide you with exceptional heart care, we have created designated Provider Care Teams.  These Care Teams include your primary Cardiologist (physician) and Advanced Practice Providers (APPs -  Physician Assistants and Nurse Practitioners) who all work together to provide you with the care you need, when you need it.  We recommend signing up for the patient portal called "MyChart".  Sign up information is provided on this After Visit Summary.  MyChart is used to connect with patients for Virtual Visits (Telemedicine).  Patients are able to view lab/test results, encounter notes, upcoming appointments, etc.  Non-urgent messages can be sent to your provider as well.   To learn more about what you can do with MyChart, go to [email or web address].    Your next appointment:   6 week(s)  Provider:   You may see Yvonne Kendall, MD or one of the following Advanced Practice Providers on your designated Care Team:   Nicolasa Ducking, NP  Other Instructions Keep your follow up with Sherie Don, NP

## 2022-10-20 ENCOUNTER — Ambulatory Visit: Payer: Medicare Other | Admitting: Cardiology

## 2022-10-27 ENCOUNTER — Inpatient Hospital Stay: Payer: Medicare Other | Attending: Oncology

## 2022-10-27 VITALS — BP 133/66 | HR 61 | Resp 16

## 2022-10-27 DIAGNOSIS — N1832 Chronic kidney disease, stage 3b: Secondary | ICD-10-CM | POA: Diagnosis present

## 2022-10-27 DIAGNOSIS — D631 Anemia in chronic kidney disease: Secondary | ICD-10-CM | POA: Diagnosis present

## 2022-10-27 DIAGNOSIS — N183 Chronic kidney disease, stage 3 unspecified: Secondary | ICD-10-CM

## 2022-10-27 MED ORDER — SODIUM CHLORIDE 0.9 % IV SOLN
Freq: Once | INTRAVENOUS | Status: AC
  Filled 2022-10-27: qty 250

## 2022-10-27 MED ORDER — SODIUM CHLORIDE 0.9 % IV SOLN
200.0000 mg | Freq: Once | INTRAVENOUS | Status: AC
  Administered 2022-10-27: 200 mg via INTRAVENOUS
  Filled 2022-10-27: qty 200

## 2022-11-02 ENCOUNTER — Ambulatory Visit: Payer: Medicare Other | Admitting: Cardiology

## 2022-11-03 NOTE — Progress Notes (Deleted)
Cardiology Office Note Date:  11/03/2022  Patient ID:  Courtney Wilcox, Courtney Wilcox 03-Dec-1945, MRN [id number] PCP:  Patrice Paradise, MD  Cardiologist:  Yvonne Kendall, MD Electrophysiologist: Lanier Prude, MD  ***refresh   Chief Complaint: ***  History of Present Illness: Courtney Wilcox is a 77 y.o. female with PMH notable for CAD s/p PCI, SVT (atrial tach), DVT/PE, SOB, fibromyalgia; seen today for Lanier Prude, MD for routine electrophysiology followup.  I saw her 05/2022 for SVT follow-up where she continued to have increased SOB. Adjusted BB > dilt without improvement. She was eval'd by Dr. Okey Dupre who recommended LHC to futher eval. LHC showed severe prox LAD disease, procedure complicated by frequent runs of SVT. She was placed on amiodarone and underwent staged PCI on 9/16. Outpatient follow-up with NP Brion Aliment noted nausea, lethargy, anorexia. Amiodarone stopped.   On follow-up today *** Nausea, lethargy since stopping amiodarone *** SOB   Since last being seen in our clinic the patient reports doing ***.  she denies chest pain, palpitations, dyspnea, PND, orthopnea, nausea, vomiting, dizziness, syncope, edema, weight gain, or early satiety.     AAD History: Amiodarone - stopped d/t GI intolerance  Past Medical History:  Diagnosis Date   Allergic rhinitis    Anxiety    Arthritis    Asthma    Atrophic vaginitis    B12 deficiency    Bronchitis, chronic (HCC)    CAD (coronary artery disease)    a. 03/2003 Cath: Nl cors; b. 09/2022 Staged PCI: LM nl, LAD 85p/76m (3.5x26 Onyx Frontier DES), RI small, mild dzs, LCX nl, OM2 mild dzs, RCA 30p.   Cervical disc disease    CKD (chronic kidney disease), stage III (HCC)    Collagen vascular disease (HCC)    RA   Cough - post-COVID    Depression    Diastolic dysfunction    a. 11/2020 Echo: EF 45-50%, GrI DD; b. 10/2021 Echo: EF 60-65%, GrII DD; c. 09/2022 Echo: EF 55-60%, no rwma, GrI DD, nl RV fxn, mildly dil LA. No  significant valvular dzs.   Fibrocystic breast disease    Foot ulcer (HCC)    GERD (gastroesophageal reflux disease)    Hammer toe    Hyperlipidemia    IBS (irritable bowel syndrome)    Macrocytic anemia    Macular degeneration 2023   Obesity    OSA (obstructive sleep apnea)    Osteoarthrosis    Multi site   Osteopenia    PSVT (paroxysmal supraventricular tachycardia) (HCC)    a. 04/2022 [facility]: Sinus rhythm @ 57 (42-101). Rare PACs/PVCs. 40981 SVT runs lasting up to 37m 12sec - max rate 222. 5 beats NSVT. Triggered events = sinus, SVT, PACs.   Rheumatoid arthritis (HCC)    Shingles    Thyroid nodule    MULTIPLE   Tonsillitis    recurrent   Umbilical hernia     Past Surgical History:  Procedure Laterality Date   BACK SURGERY  2011   LAMINECTOMY   BREAST BIOPSY Right    neg   CERVICAL FUSION  2011, 2012   X 2   COLONOSCOPY  2005, 2015   COLONOSCOPY WITH PROPOFOL N/A 08/04/2020   Procedure: COLONOSCOPY WITH PROPOFOL;  Surgeon: Earline Mayotte, MD;  Location: [facility] ENDOSCOPY;  Service: Endoscopy;  Laterality: N/A;   CORONARY STENT INTERVENTION N/A 10/02/2022   Procedure: CORONARY STENT INTERVENTION;  Surgeon: Iran Ouch, MD;  Location: [facility];  Service: Cardiovascular;  Laterality: N/A;   ESOPHAGOGASTRODUODENOSCOPY (EGD) WITH PROPOFOL N/A 09/26/2021   Procedure: ESOPHAGOGASTRODUODENOSCOPY (EGD) WITH PROPOFOL;  Surgeon: Jaynie Collins, DO;  Location: [facility] ENDOSCOPY;  Service: Gastroenterology;  Laterality: N/A;   ESOPHAGOGASTRODUODENOSCOPY ENDOSCOPY  2005, 2013, 2015   EYE SURGERY Bilateral    Cataract Extraction with IOL    HERNIA REPAIR     JOINT REPLACEMENT     KNEE ARTHROPLASTY Left 07/12/2016   Procedure: COMPUTER ASSISTED TOTAL KNEE ARTHROPLASTY;  Surgeon: Donato Heinz, MD;  Location: [facility] ORS;  Service: Orthopedics;  Laterality: Left;   Laryngeal tear after intubation w/repair     LUMBAR DISC SURGERY  12/2009   REPLACEMENT TOTAL KNEE Right  06/2007   DR. HOOTEN, [facility]   REPLACEMENT UNICONDYLAR JOINT KNEE     DR. HOOTEN, [facility]   RIGHT/LEFT HEART CATH AND CORONARY ANGIOGRAPHY N/A 09/29/2022   Procedure: RIGHT/LEFT HEART CATH AND CORONARY ANGIOGRAPHY;  Surgeon: Iran Ouch, MD;  Location: [facility] CV [facility];  Service: Cardiovascular;  Laterality: N/A;   SHOULDER ARTHROSCOPY WITH ROTATOR CUFF REPAIR Right    TONSILLECTOMY     TOTAL HIP ARTHROPLASTY Right 01/31/2017   Procedure: TOTAL HIP ARTHROPLASTY;  Surgeon: Donato Heinz, MD;  Location: [facility] ORS;  Service: Orthopedics;  Laterality: Right;   TUBAL LIGATION     UPPER GI ENDOSCOPY      Current Outpatient Medications  Medication Instructions   acetaminophen (TYLENOL) 1,000 mg, Oral, 3 times daily PRN   ALPRAZolam (XANAX) 0.5 mg, Oral, At bedtime PRN   aspirin 81 mg, Oral, Daily   atorvastatin (LIPITOR) 80 mg, Oral, Daily   clopidogrel (PLAVIX) 75 mg, Oral, Daily with breakfast   Cyanocobalamin 1000 MCG/ML KIT Injection, Every 30 days   diltiazem (CARDIZEM CD) 180 mg, Oral, Daily   Dupilumab 300 MG/2ML SOPN 4 mLs, Subcutaneous, Every 14 days   EPINEPHrine (EPI-PEN) 0.3 mg, Intramuscular, As needed   famotidine (PEPCID) 20 mg, Oral, Daily PRN   hydroxychloroquine (PLAQUENIL) 200 mg, Oral, Daily   leflunomide (ARAVA) 10 mg, Oral, Daily   levalbuterol (XOPENEX) 0.63 mg, Nebulization, Every 6 hours PRN   meclizine (ANTIVERT) 12.5 MG tablet PLEASE SEE ATTACHED FOR DETAILED DIRECTIONS   montelukast (SINGULAIR) 10 mg, Oral, Daily PRN   nystatin (MYCOSTATIN) 100000 UNIT/ML suspension 5 mLs, Oral, As needed   pantoprazole (PROTONIX) 40 mg, Oral, 2 times daily   predniSONE (DELTASONE) 2.5 mg, Oral, Daily with breakfast   sertraline (ZOLOFT) 25 MG tablet 1 tablet, Oral, Daily   Simethicone 180 MG CAPS 1-2 capsules, Oral, As needed   triamcinolone (NASACORT) 55 MCG/ACT AERO nasal inhaler 2 sprays, Nasal, Daily   Vitamin D 2,000 Units, Oral, Daily   ZENPEP 25000-79000 units  CPEP Oral, Occasionally takes with a large meal    Social History:  The patient  reports that she has never smoked. She has never used smokeless tobacco. She reports that she does not drink alcohol and does not use drugs.   Family History:  *** include only if pertinent The patient's family history includes Asthma in her mother; Breast cancer (age of onset: 52) in her mother; Coronary artery disease (age of onset: 21) in her father; Emphysema in her father; Lung cancer in her father.***  ROS:  Please see the history of present illness. All other systems are reviewed and otherwise negative.   PHYSICAL EXAM: *** VS:  There were no vitals taken for this visit. BMI: There is no height or weight on file to calculate BMI.  GEN- The patient is well appearing, alert and oriented x 3 today.   Lungs- Clear to ausculation bilaterally, normal work of breathing.  Heart- {Blank single:19197::"Regular","Irregularly irregular"} rate and rhythm, no murmurs, rubs or gallops Extremities- {EDEMA LEVEL:28147::"No"} peripheral edema, warm, dry   EKG is ordered. Personal review of EKG from today shows:  ***       Recent Labs: 09/27/2022: B Natriuretic Peptide 241.2; Magnesium 1.8 09/28/2022: TSH 3.085 10/11/2022: ALT 11; Hemoglobin 9.7; Platelets 198 10/16/2022: BUN 12; Creatinine, Ser 2.09; Potassium 4.0; Sodium 141  No results found for requested labs within last 365 days.   CrCl cannot be calculated (Unknown ideal weight.).   Wt Readings from Last 3 Encounters:  10/19/22 183 lb 6.4 oz (83.2 kg)  10/02/22 188 lb 15 oz (85.7 kg)  09/27/22 189 lb (85.7 kg)     Additional studies reviewed include: Previous EP, cardiology notes.   Coronary stent intervention, 10/02/2022   Prox LAD lesion is 85% stenosed.   Prox LAD to Mid LAD lesion is 50% stenosed.   Prox RCA lesion is 30% stenosed.   A drug-eluting stent was successfully placed using a STENT ONYX FRONTIER 3.5X26.   Post intervention, there is a 10%  residual stenosis.   Post intervention, there is a 0% residual stenosis.   Successful drug-eluting stent placement to the ostial/proximal LAD.  The lesion is very eccentric and fibrotic in the proximal segment and thus there was a 10% residual stenosis and spite of high-pressure noncompliant balloon.   Recommendations: Dual antiplatelet therapy for at least 6 months. Aggressive treatment of risk factors. Hydrate overnight and check renal function in the morning.  68 mL of contrast was used for the procedure.  R/LHC, 09/29/2022   Prox RCA lesion is 30% stenosed.   Prox LAD lesion is 85% stenosed.   Prox LAD to Mid LAD lesion is 50% stenosed.   1.  Severe one-vessel coronary artery disease involving the ostial/proximal LAD 2.  Left ventricular angiography was not performed.  EF was normal by echo.  Normal left ventricular end-diastolic pressure. 3.  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 4.  Frequent supraventricular tachycardia throughout the case which made angiography difficult.  TTE, 09/29/2022  1. Left ventricular ejection fraction, by estimation, is 55 to 60%. The left ventricle has normal function. The left ventricle has no regional wall motion abnormalities. Left ventricular diastolic parameters are consistent with Grade I diastolic dysfunction (impaired relaxation).   2. Right ventricular systolic function is normal. The right ventricular size is normal.   3. Left atrial size was mildly dilated.   4. The mitral valve is normal in structure. No evidence of mitral valve regurgitation.   5. The aortic valve is tricuspid. Aortic valve regurgitation is not visualized.   6. The inferior vena cava is normal in size with greater than 50% respiratory variability, suggesting right atrial pressure of 3 mmHg.   TTE,  10/31/2021  1. Left ventricular ejection fraction, by estimation, is 60 to 65%. The left ventricle has normal function. The left ventricle has no regional wall motion abnormalities. There is mild left ventricular hypertrophy. Left ventricular diastolic parameters are consistent with Grade II diastolic dysfunction (pseudonormalization).   2. Right ventricular systolic function is normal. The right ventricular size is normal.   3. Left atrial size was severely dilated.  4. The mitral valve is normal in structure. Mild mitral valve regurgitation.   5. The aortic valve is tricuspid. Aortic valve regurgitation is not visualized.   6. The inferior vena cava is normal in size with greater than 50% respiratory variability, suggesting right atrial pressure of 3 mmHg.   Cardiac telemetry, 06/02/2022   The patient was monitored for 14 days.   The predominant rhythm was sinus with an average rate of 57 bpm while in sinus rhythm (range 42-101 bpm).   There were rare PACs and PVCs.   25,709 episode of supraventricular tachycardia cardia occurred, lasting up to 40:12 minutes with a maximum rate of 222 bpm.   A single 5 beat run of nonsustained ventricular tachycardia was observed with a maximum rate of 174 bpm.   There was no prolonged pause.   Patient triggered events corresponded to sinus rhythm, SVT, and PACs.   ASSESSMENT AND PLAN:  #) SVT, likely atrial tach #) high-risk medication monitoring - amiodarone #) SOB, fatigue, malaise, nausea, anorexia   #) CAD s/p PCI   {Are you ordering a CV Procedure (e.g. stress test, cath, DCCV, TEE, etc)?   Press F2        :[id number]}   Current medicines are reviewed at length with the patient today.   The patient {ACTIONS; HAS/DOES NOT HAVE:19233} concerns regarding her medicines.  The following changes were made today:  {NONE DEFAULTED:18576}  Labs/ tests ordered today include: *** No orders of the defined types were placed in this  encounter.    Disposition: Follow up with {EPMDS:28135} or EP APP {EPFOLLOW UP:28173}   Signed, Sherie Don, NP  11/03/22  2:56 PM  Electrophysiology [facility]

## 2022-11-06 ENCOUNTER — Ambulatory Visit: Payer: Medicare Other | Admitting: Cardiology

## 2022-11-08 NOTE — Progress Notes (Unsigned)
Cardiology Office Note Date:  11/09/2022  Patient ID:  Courtney Wilcox, Courtney Wilcox [birth year], MRN [id number] PCP:  Patrice Paradise, MD  Cardiologist:  Yvonne Kendall, MD Electrophysiologist: Lanier Prude, MD    Chief Complaint: SVT  History of Present Illness: LENNAN WESELY is a 77 y.o. female with PMH notable for CAD s/p PCI, SVT (atrial tach), DVT/PE, SOB, fibromyalgia; seen today for Lanier Prude, MD for routine electrophysiology followup.  I saw her 05/2022 for SVT follow-up where she continued to have increased SOB. Adjusted BB > dilt without improvement. She was eval'd by Dr. Okey Dupre who recommended LHC to futher eval. LHC showed severe prox LAD disease, procedure complicated by frequent runs of SVT. She was placed on amiodarone and underwent staged PCI on 9/16. Outpatient follow-up with NP Brion Aliment noted nausea, lethargy, anorexia. Amiodarone stopped.   On follow-up today, her nausea and anorexia has significantly improved since stopping amiodarone. She, unfortunately though, has had increased SOB. Her daughter joins for appointment and they do not think she has fully recovered from her hospitalization.  She checks BP and pulse regularly at home. Pulse has stayed below 100. BP is 120-140s systolic.  She denies chest pain, chest pressure.  Continues to take plavix daily, no missed doses. No bleeding concerns.     AAD History: Amiodarone - stopped d/t GI intolerance  Past Medical History:  Diagnosis Date   Allergic rhinitis    Anxiety    Arthritis    Asthma    Atrophic vaginitis    B12 deficiency    Bronchitis, chronic (HCC)    CAD (coronary artery disease)    a. 03/2003 Cath: Nl cors; b. 09/2022 Staged PCI: LM nl, LAD 85p/60m (3.5x26 Onyx Frontier DES), RI small, mild dzs, LCX nl, OM2 mild dzs, RCA 30p.   Cervical disc disease    CKD (chronic kidney disease), stage III (HCC)    Collagen vascular disease (HCC)    RA   Cough - post-COVID    Depression     Diastolic dysfunction    a. 11/2020 Echo: EF 45-50%, GrI DD; b. 10/2021 Echo: EF 60-65%, GrII DD; c. 09/2022 Echo: EF 55-60%, no rwma, GrI DD, nl RV fxn, mildly dil LA. No significant valvular dzs.   Fibrocystic breast disease    Foot ulcer (HCC)    GERD (gastroesophageal reflux disease)    Hammer toe    Hyperlipidemia    IBS (irritable bowel syndrome)    Macrocytic anemia    Macular degeneration 2023   Obesity    OSA (obstructive sleep apnea)    Osteoarthrosis    Multi site   Osteopenia    PSVT (paroxysmal supraventricular tachycardia) (HCC)    a. 04/2022 Zio: Sinus rhythm @ 57 (42-101). Rare PACs/PVCs. 33295 SVT runs lasting up to 59m 12sec - max rate 222. 5 beats NSVT. Triggered events = sinus, SVT, PACs.   Rheumatoid arthritis (HCC)    Shingles    Thyroid nodule    MULTIPLE   Tonsillitis    recurrent   Umbilical hernia     Past Surgical History:  Procedure Laterality Date   BACK SURGERY  2011   LAMINECTOMY   BREAST BIOPSY Right    neg   CERVICAL FUSION  2011, 2012   X 2   COLONOSCOPY  2005, 2015   COLONOSCOPY WITH PROPOFOL N/A 08/04/2020   Procedure: COLONOSCOPY WITH PROPOFOL;  Surgeon: Earline Mayotte, MD;  Location: [facility] ENDOSCOPY;  Service: Endoscopy;  Laterality:  N/A;   CORONARY STENT INTERVENTION N/A 10/02/2022   Procedure: CORONARY STENT INTERVENTION;  Surgeon: Iran Ouch, MD;  Location: [facility] INVASIVE CV [facility];  Service: Cardiovascular;  Laterality: N/A;   ESOPHAGOGASTRODUODENOSCOPY (EGD) WITH PROPOFOL N/A 09/26/2021   Procedure: ESOPHAGOGASTRODUODENOSCOPY (EGD) WITH PROPOFOL;  Surgeon: Jaynie Collins, DO;  Location: [facility] ENDOSCOPY;  Service: Gastroenterology;  Laterality: N/A;   ESOPHAGOGASTRODUODENOSCOPY ENDOSCOPY  2005, 2013, 2015   EYE SURGERY Bilateral    Cataract Extraction with IOL    HERNIA REPAIR     JOINT REPLACEMENT     KNEE ARTHROPLASTY Left 07/12/2016   Procedure: COMPUTER ASSISTED TOTAL KNEE ARTHROPLASTY;  Surgeon: Donato Heinz, MD;   Location: [facility] ORS;  Service: Orthopedics;  Laterality: Left;   Laryngeal tear after intubation w/repair     LUMBAR DISC SURGERY  12/2009   REPLACEMENT TOTAL KNEE Right 06/2007   DR. HOOTEN, [facility]   REPLACEMENT UNICONDYLAR JOINT KNEE     DR. HOOTEN, [facility]   RIGHT/LEFT HEART CATH AND CORONARY ANGIOGRAPHY N/A 09/29/2022   Procedure: RIGHT/LEFT HEART CATH AND CORONARY ANGIOGRAPHY;  Surgeon: Iran Ouch, MD;  Location: [facility] INVASIVE CV LAB;  Service: Cardiovascular;  Laterality: N/A;   SHOULDER ARTHROSCOPY WITH ROTATOR CUFF REPAIR Right    TONSILLECTOMY     TOTAL HIP ARTHROPLASTY Right 01/31/2017   Procedure: TOTAL HIP ARTHROPLASTY;  Surgeon: Donato Heinz, MD;  Location: [facility] ORS;  Service: Orthopedics;  Laterality: Right;   TUBAL LIGATION     UPPER GI ENDOSCOPY      Current Outpatient Medications  Medication Instructions   acetaminophen (TYLENOL) 1,000 mg, Oral, 3 times daily PRN   ALPRAZolam (XANAX) 0.5 mg, Oral, At bedtime PRN   aspirin 81 mg, Oral, Daily   atorvastatin (LIPITOR) 80 mg, Oral, Daily   clopidogrel (PLAVIX) 75 mg, Oral, Daily with breakfast   Cyanocobalamin 1000 MCG/ML KIT Injection, Every 30 days   diltiazem (CARDIZEM CD) 240 mg, Oral, Daily   Dupilumab 300 MG/2ML SOPN 4 mLs, Subcutaneous, Every 14 days   EPINEPHrine (EPI-PEN) 0.3 mg, Intramuscular, As needed   famotidine (PEPCID) 20 mg, Oral, Daily PRN   hydroxychloroquine (PLAQUENIL) 200 mg, Oral, Daily   leflunomide (ARAVA) 10 mg, Oral, Daily   levalbuterol (XOPENEX) 0.63 mg, Nebulization, Every 6 hours PRN   meclizine (ANTIVERT) 12.5 MG tablet PLEASE SEE ATTACHED FOR DETAILED DIRECTIONS   montelukast (SINGULAIR) 10 mg, Oral, Daily PRN   nystatin (MYCOSTATIN) 100000 UNIT/ML suspension 5 mLs, Oral, As needed   pantoprazole (PROTONIX) 40 mg, Oral, 2 times daily   predniSONE (DELTASONE) 2.5 mg, Oral, Daily with breakfast   sertraline (ZOLOFT) 25 MG tablet 1 tablet, Oral, Daily   Simethicone 180 MG CAPS 1-2  capsules, Oral, As needed   triamcinolone (NASACORT) 55 MCG/ACT AERO nasal inhaler 2 sprays, Nasal, Daily   Vitamin D 2,000 Units, Oral, Daily   ZENPEP 25000-79000 units CPEP Take by mouth. Occasionally takes with a large meal    Social History:  The patient  reports that she has never smoked. She has never used smokeless tobacco. She reports that she does not drink alcohol and does not use drugs.   Family History:   The patient's family history includes Asthma in her mother; Breast cancer (age of onset: 61) in her mother; Coronary artery disease (age of onset: 21) in her father; Emphysema in her father; Lung cancer in her father.  ROS:  Please see the history of present illness. All other systems are reviewed and otherwise  negative.   PHYSICAL EXAM:  VS:  BP 120/68 (BP Location: Left Arm, Patient Position: Sitting, Cuff Size: Normal)   Pulse 97   Ht 5\' 1"  (1.549 m)   Wt 187 lb (84.8 kg)   SpO2 96%   BMI 35.33 kg/m  BMI: Body mass index is 35.33 kg/m.  GEN- The patient is well appearing, alert and oriented x 3 today.   Lungs- Clear to ausculation bilaterally, normal work of breathing.  Heart- Irregularly irregular rate and rhythm, no murmurs, rubs or gallops Extremities- Trace peripheral edema, warm, dry   EKG is ordered. Personal review of EKG from today shows:    EKG Interpretation Date/Time:  Thursday November 09 2022 14:02:24 EDT Ventricular Rate:  97 PR Interval:    QRS Duration:  98 QT Interval:  392 QTC Calculation: 497 R Axis:   -26  Text Interpretation: sinus rhythm with frequent premature atrial contractions variable PR interval Confirmed by Sherie Don [phone]) on 11/09/2022 4:26:47 PM    Recent Labs: 09/27/2022: B Natriuretic Peptide 241.2; Magnesium 1.8 09/28/2022: TSH 3.085 10/11/2022: ALT 11; Hemoglobin 9.7; Platelets 198 10/16/2022: BUN 12; Creatinine, Ser 2.09; Potassium 4.0; Sodium 141  No results found for requested labs within last 365 days.   CrCl  cannot be calculated (Patient's most recent lab result is older than the maximum 21 days allowed.).   Wt Readings from Last 3 Encounters:  11/09/22 187 lb (84.8 kg)  10/19/22 183 lb 6.4 oz (83.2 kg)  10/02/22 188 lb 15 oz (85.7 kg)     Additional studies reviewed include: Previous EP, cardiology notes.   Coronary stent intervention, 10/02/2022   Prox LAD lesion is 85% stenosed.   Prox LAD to Mid LAD lesion is 50% stenosed.   Prox RCA lesion is 30% stenosed.   A drug-eluting stent was successfully placed using a STENT ONYX FRONTIER 3.5X26.   Post intervention, there is a 10% residual stenosis.   Post intervention, there is a 0% residual stenosis.   Successful drug-eluting stent placement to the ostial/proximal LAD.  The lesion is very eccentric and fibrotic in the proximal segment and thus there was a 10% residual stenosis and spite of high-pressure noncompliant balloon.   Recommendations: Dual antiplatelet therapy for at least 6 months. Aggressive treatment of risk factors. Hydrate overnight and check renal function in the morning.  68 mL of contrast was used for the procedure.  R/LHC, 09/29/2022   Prox RCA lesion is 30% stenosed.   Prox LAD lesion is 85% stenosed.   Prox LAD to Mid LAD lesion is 50% stenosed.   1.  Severe one-vessel coronary artery disease involving the ostial/proximal LAD 2.  Left ventricular angiography was not performed.  EF was normal by echo.  Normal left ventricular end-diastolic pressure. 3.  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 4.  Frequent supraventricular tachycardia throughout the case which made angiography difficult.  TTE, 09/29/2022  1. Left ventricular ejection fraction, by estimation, is 55 to 60%. The left ventricle has normal function. The left ventricle has no regional wall  motion abnormalities. Left ventricular diastolic parameters are consistent with Grade I diastolic dysfunction (impaired relaxation).   2. Right ventricular systolic function is normal. The right ventricular size is normal.   3. Left atrial size was mildly dilated.   4. The mitral valve is normal  in structure. No evidence of mitral valve regurgitation.   5. The aortic valve is tricuspid. Aortic valve regurgitation is not visualized.   6. The inferior vena cava is normal in size with greater than 50% respiratory variability, suggesting right atrial pressure of 3 mmHg.   Cardiac telemetry, 06/02/2022   The patient was monitored for 14 days.   The predominant rhythm was sinus with an average rate of 57 bpm while in sinus rhythm (range 42-101 bpm).   There were rare PACs and PVCs.   25,709 episode of supraventricular tachycardia cardia occurred, lasting up to 40:12 minutes with a maximum rate of 222 bpm.   A single 5 beat run of nonsustained ventricular tachycardia was observed with a maximum rate of 174 bpm.   There was no prolonged pause.   Patient triggered events corresponded to sinus rhythm, SVT, and PACs.  TTE, 10/31/2021  1. Left ventricular ejection fraction, by estimation, is 60 to 65%. The left ventricle has normal function. The left ventricle has no regional wall motion abnormalities. There is mild left ventricular hypertrophy. Left ventricular diastolic parameters are consistent with Grade II diastolic dysfunction (pseudonormalization).   2. Right ventricular systolic function is normal. The right ventricular size is normal.   3. Left atrial size was severely dilated.   4. The mitral valve is normal in structure. Mild mitral valve regurgitation.   5. The aortic valve is tricuspid. Aortic valve regurgitation is not visualized.   6. The inferior vena cava is normal in size with greater than 50% respiratory variability, suggesting right atrial pressure of 3 mmHg.   ASSESSMENT AND  PLAN:  #) SVT, likely atrial tach #) high-risk medication monitoring - amiodarone #) SOB, fatigue, malaise, nausea, anorexia GI symptoms, fatigue significantly improved since stopping amiodarone. Unfortunately, SVT episodes seem to be returning.  Home BP readings appear robust, so will increase dilt to 240mg  daily Encouraged daily BP checks and record readings, bring to follow-up appt. If dizziness, lightheaded occur, notify office and reduce dilt to 180mg  daily (current dose)  #) CAD s/p PCI No chest pain, though is having return of some SOB Tolerating plavix, continue 75mg  daily      Current medicines are reviewed at length with the patient today.   The patient has concerns regarding her medicines.  The following changes were made today:  INCREASE dilt to 240mg  daily  Labs/ tests ordered today include:  Orders Placed This Encounter  Procedures   EKG 12-Lead     Disposition: Follow up with Dr. Lalla Brothers  in 3 months Follow- up with NP Brion Aliment in 1 month   Signed, Sherie Don, NP  11/09/22  4:30 PM  Electrophysiology [facility]

## 2022-11-09 ENCOUNTER — Encounter: Payer: Self-pay | Admitting: Cardiology

## 2022-11-09 ENCOUNTER — Ambulatory Visit: Payer: Medicare Other | Attending: Cardiology | Admitting: Cardiology

## 2022-11-09 VITALS — BP 120/68 | HR 97 | Ht 61.0 in | Wt 187.0 lb

## 2022-11-09 DIAGNOSIS — I471 Supraventricular tachycardia, unspecified: Secondary | ICD-10-CM | POA: Insufficient documentation

## 2022-11-09 DIAGNOSIS — I2510 Atherosclerotic heart disease of native coronary artery without angina pectoris: Secondary | ICD-10-CM | POA: Diagnosis present

## 2022-11-09 MED ORDER — DILTIAZEM HCL ER COATED BEADS 240 MG PO CP24
240.0000 mg | ORAL_CAPSULE | Freq: Every day | ORAL | 3 refills | Status: DC
Start: 2022-11-09 — End: 2023-06-27

## 2022-11-09 NOTE — Patient Instructions (Signed)
Medication Instructions:  Your physician recommends the following medication changes.  INCREASE: Cardizem CD 240 mg daily *If you need a refill on your cardiac medications before your next appointment, please call your pharmacy*   Lab Work: None ordered If you have labs (blood work) drawn today and your tests are completely normal, you will receive your results only by: MyChart Message (if you have MyChart) OR A paper copy in the mail If you have any lab test that is abnormal or we need to change your treatment, we will call you to review the results.   Testing/Procedures: None ordered   Follow-Up: At [facility], you and your health needs are our priority.  As part of our continuing mission to provide you with exceptional heart care, we have created designated Provider Care Teams.  These Care Teams include your primary Cardiologist (physician) and Advanced Practice Providers (APPs -  Physician Assistants and Nurse Practitioners) who all work together to provide you with the care you need, when you need it.  We recommend signing up for the patient portal called "MyChart".  Sign up information is provided on this After Visit Summary.  MyChart is used to connect with patients for Virtual Visits (Telemedicine).  Patients are able to view lab/test results, encounter notes, upcoming appointments, etc.  Non-urgent messages can be sent to your provider as well.   To learn more about what you can do with MyChart, go to [email or web address].    Your next appointment:   3 month(s)  Provider:   Steffanie Dunn, MD

## 2022-11-28 MED ORDER — ATORVASTATIN CALCIUM 80 MG PO TABS: 80.0000 mg | ORAL_TABLET | Freq: Every day | ORAL | 0 refills | Status: DC

## 2022-11-28 MED ORDER — CLOPIDOGREL BISULFATE 75 MG PO TABS: 75.0000 mg | ORAL_TABLET | Freq: Every day | ORAL | 0 refills | Status: DC

## 2022-12-07 ENCOUNTER — Ambulatory Visit (INDEPENDENT_AMBULATORY_CARE_PROVIDER_SITE_OTHER): Payer: Medicare Other | Admitting: Nurse Practitioner

## 2022-12-07 ENCOUNTER — Encounter: Payer: Self-pay | Admitting: Nurse Practitioner

## 2022-12-07 ENCOUNTER — Inpatient Hospital Stay: Payer: Medicare Other | Attending: Oncology

## 2022-12-07 VITALS — BP 124/74 | HR 87 | Ht 63.0 in | Wt 191.0 lb

## 2022-12-07 DIAGNOSIS — I2510 Atherosclerotic heart disease of native coronary artery without angina pectoris: Secondary | ICD-10-CM | POA: Insufficient documentation

## 2022-12-07 DIAGNOSIS — N183 Chronic kidney disease, stage 3 unspecified: Secondary | ICD-10-CM | POA: Insufficient documentation

## 2022-12-07 DIAGNOSIS — E785 Hyperlipidemia, unspecified: Secondary | ICD-10-CM | POA: Insufficient documentation

## 2022-12-07 DIAGNOSIS — J45909 Unspecified asthma, uncomplicated: Secondary | ICD-10-CM | POA: Insufficient documentation

## 2022-12-07 DIAGNOSIS — D631 Anemia in chronic kidney disease: Secondary | ICD-10-CM | POA: Diagnosis present

## 2022-12-07 DIAGNOSIS — I471 Supraventricular tachycardia, unspecified: Secondary | ICD-10-CM | POA: Insufficient documentation

## 2022-12-07 DIAGNOSIS — N1832 Chronic kidney disease, stage 3b: Secondary | ICD-10-CM | POA: Insufficient documentation

## 2022-12-07 DIAGNOSIS — D638 Anemia in other chronic diseases classified elsewhere: Secondary | ICD-10-CM

## 2022-12-07 LAB — FERRITIN: Ferritin: 209 ng/mL (ref 11–307)

## 2022-12-07 LAB — RETIC PANEL
Immature Retic Fract: 12.3 % (ref 2.3–15.9)
RBC.: 3.19 MIL/uL — ABNORMAL LOW (ref 3.87–5.11)
Retic Count, Absolute: 85.5 10*3/uL (ref 19.0–186.0)
Retic Ct Pct: 2.7 % (ref 0.4–3.1)
Reticulocyte Hemoglobin: 34.4 pg (ref >27.9)

## 2022-12-07 LAB — CBC WITH DIFFERENTIAL (CANCER CENTER ONLY)
Abs Immature Granulocytes: 0.16 10*3/uL — ABNORMAL HIGH (ref 0.00–0.07)
Basophils Absolute: 0 10*3/uL (ref 0.0–0.1)
Basophils Relative: 0 %
Eosinophils Absolute: 0.1 10*3/uL (ref 0.0–0.5)
Eosinophils Relative: 1 %
HCT: 33.6 % — ABNORMAL LOW (ref 36.0–46.0)
Hemoglobin: 10.5 g/dL — ABNORMAL LOW (ref 12.0–15.0)
Immature Granulocytes: 2 %
Lymphocytes Relative: 6 %
Lymphs Abs: 0.6 10*3/uL — ABNORMAL LOW (ref 0.7–4.0)
MCH: 32.6 pg (ref 26.0–34.0)
MCHC: 31.3 g/dL (ref 30.0–36.0)
MCV: 104.3 fL — ABNORMAL HIGH (ref 80.0–100.0)
Monocytes Absolute: 0.6 10*3/uL (ref 0.1–1.0)
Monocytes Relative: 6 %
Neutro Abs: 7.7 10*3/uL (ref 1.7–7.7)
Neutrophils Relative %: 85 %
Platelet Count: 219 10*3/uL (ref 150–400)
RBC: 3.22 MIL/uL — ABNORMAL LOW (ref 3.87–5.11)
RDW: 14.8 % (ref 11.5–15.5)
WBC Count: 9.1 10*3/uL (ref 4.0–10.5)
nRBC: 0 % (ref 0.0–0.2)

## 2022-12-07 LAB — IRON AND TIBC
Iron: 59 ug/dL (ref 28–170)
Saturation Ratios: 23 % (ref 10.4–31.8)
TIBC: 253 ug/dL (ref 250–450)
UIBC: 194 ug/dL

## 2022-12-07 NOTE — Progress Notes (Signed)
Office Visit    Patient Name: Courtney Wilcox Date of Encounter: 12/07/2022  Primary Care Provider:  Patrice Paradise, MD Primary Cardiologist:  Yvonne Kendall, MD  Chief Complaint    77 y.o. female  with a history of, PSVT, PE (November 2022), diastolic dysfunction, hyperlipidemia, stage III chronic kidney disease, asthma w/ post-COVID cough, B12 deficiency macrocytic anemia, depression, anxiety and rheumatoid arthritis, who presents for CAD f/u.  Past Medical History  Subjective   Past Medical History:  Diagnosis Date   Allergic rhinitis    Anxiety    Arthritis    Asthma    Atrophic vaginitis    B12 deficiency    Bronchitis, chronic (HCC)    CAD (coronary artery disease)    a. 03/2003 Cath: Nl cors; b. 09/2022 Staged PCI: LM nl, LAD 85p/61m (3.5x26 Onyx Frontier DES), RI small, mild dzs, LCX nl, OM2 mild dzs, RCA 30p.   Cervical disc disease    CKD (chronic kidney disease), stage III (HCC)    Collagen vascular disease (HCC)    RA   Cough - post-COVID    Depression    Diastolic dysfunction    a. 11/2020 Echo: EF 45-50%, GrI DD; b. 10/2021 Echo: EF 60-65%, GrII DD; c. 09/2022 Echo: EF 55-60%, no rwma, GrI DD, nl RV fxn, mildly dil LA. No significant valvular dzs.   Fibrocystic breast disease    Foot ulcer (HCC)    GERD (gastroesophageal reflux disease)    Hammer toe    Hyperlipidemia    IBS (irritable bowel syndrome)    Macrocytic anemia    Macular degeneration 2023   Obesity    OSA (obstructive sleep apnea)    Osteoarthrosis    Multi site   Osteopenia    PSVT (paroxysmal supraventricular tachycardia) (HCC)    a. 04/2022 Zio: Sinus rhythm @ 57 (42-101). Rare PACs/PVCs. 40981 SVT runs lasting up to 23m 12sec - max rate 222. 5 beats NSVT. Triggered events = sinus, SVT, PACs.   Rheumatoid arthritis (HCC)    Shingles    Thyroid nodule    MULTIPLE   Tonsillitis    recurrent   Umbilical hernia    Past Surgical History:  Procedure Laterality Date   BACK  SURGERY  2011   LAMINECTOMY   BREAST BIOPSY Right    neg   CERVICAL FUSION  2011, 2012   X 2   COLONOSCOPY  2005, 2015   COLONOSCOPY WITH PROPOFOL N/A 08/04/2020   Procedure: COLONOSCOPY WITH PROPOFOL;  Surgeon: Earline Mayotte, MD;  Location: [facility] ENDOSCOPY;  Service: Endoscopy;  Laterality: N/A;   CORONARY STENT INTERVENTION N/A 10/02/2022   Procedure: CORONARY STENT INTERVENTION;  Surgeon: Iran Ouch, MD;  Location: [facility] INVASIVE CV LAB;  Service: Cardiovascular;  Laterality: N/A;   ESOPHAGOGASTRODUODENOSCOPY (EGD) WITH PROPOFOL N/A 09/26/2021   Procedure: ESOPHAGOGASTRODUODENOSCOPY (EGD) WITH PROPOFOL;  Surgeon: Jaynie Collins, DO;  Location: [facility] ENDOSCOPY;  Service: Gastroenterology;  Laterality: N/A;   ESOPHAGOGASTRODUODENOSCOPY ENDOSCOPY  2005, 2013, 2015   EYE SURGERY Bilateral    Cataract Extraction with IOL    HERNIA REPAIR     JOINT REPLACEMENT     KNEE ARTHROPLASTY Left 07/12/2016   Procedure: COMPUTER ASSISTED TOTAL KNEE ARTHROPLASTY;  Surgeon: Donato Heinz, MD;  Location: [facility] ORS;  Service: Orthopedics;  Laterality: Left;   Laryngeal tear after intubation w/repair     LUMBAR DISC SURGERY  12/2009   REPLACEMENT TOTAL KNEE Right 06/2007   DR. HOOTEN,  [facility]   REPLACEMENT UNICONDYLAR JOINT KNEE     DR. HOOTEN, [facility]   RIGHT/LEFT HEART CATH AND CORONARY ANGIOGRAPHY N/A 09/29/2022   Procedure: RIGHT/LEFT HEART CATH AND CORONARY ANGIOGRAPHY;  Surgeon: Iran Ouch, MD;  Location: [facility] INVASIVE CV LAB;  Service: Cardiovascular;  Laterality: N/A;   SHOULDER ARTHROSCOPY WITH ROTATOR CUFF REPAIR Right    TONSILLECTOMY     TOTAL HIP ARTHROPLASTY Right 01/31/2017   Procedure: TOTAL HIP ARTHROPLASTY;  Surgeon: Donato Heinz, MD;  Location: [facility] ORS;  Service: Orthopedics;  Laterality: Right;   TUBAL LIGATION     UPPER GI ENDOSCOPY      Allergies  Allergies  Allergen Reactions   Amiodarone Other (See Comments)    Fatigue, Nausea, anorexia, malaise    Lactose Other (See Comments) and Diarrhea   Other Other (See Comments)    Pt ONLY tolerates SLOW-FE (slow released iron)---upsets IBS   Biaxin [Clarithromycin] Other (See Comments)    GI upset    Hydrocodone-Acetaminophen Itching    Tolerates acetaminophen    Oxycodone Itching   Sulfa Antibiotics Other (See Comments) and Diarrhea    GI upset  GI upset GI upset    Sulfasalazine Diarrhea      History of Present Illness      77 y.o. y/o female with a history of, PSVT, PE (November 2022), diastolic dysfunction, hyperlipidemia stage III chronic kidney disease, asthma w/ post-COVID cough, B12 deficiency macrocytic anemia, depression, anxiety and rheumatoid arthritis.  She previously underwent diagnostic catheterization 2005, which showed normal coronary arteries.  More recently, she was seen by Dr. Lalla Brothers in June 2023 in the setting of PSVT, which was felt to be atrial tachycardia.  She was switched from short-acting to long-acting metoprolol with subsequent plan to place a loop monitor to assess overall SVT burden.  This was subsequently deferred due to perceived low burden which was felt to be a type contributing to her baseline dyspnea.  Echo in October 2023 showed EF 60 to 65% with grade 2 diastolic dysfunction and mild to moderate mitral regurgitation.  She has also been followed by [facility] pulmonology in the setting of persistent dyspnea, asthma, post-COVID cough, and history of PE on oral anticoagulation.  Ms. Badman established general cardiology care with Dr. Okey Dupre in January 2024.  At that time, she was having ongoing dyspnea and intermittent chest discomfort.  It was felt that if her symptoms didn't improve following pulmonology medical interventions, or iron infusions in the setting of ongoing anemia, diagnostic catheterization may become appropriate.  At April 2024 follow-up, she was noted to have recurrent SVT.  14-day ZIO monitor was placed and subsequently showed 25,700 SVT runs  lasting up to 40 minutes at a maximum rate of 222 bpm.  Average resting rate was 57.  She was reevaluated by electrophysiology and switched from Toprol-XL to diltiazem, which was subsequently titrated to 180 mg daily due to ongoing tachycardia, w/o significant improvement in overall burden when compared to toprol xl therapy.  At September 27, 2022 cardiology follow-up, Ms. Ofarrell c/o or dyspnea substernal chest tightness.  She was referred to the emergency department due to active chest pain, and ruled out for MI.  She underwent diagnostic catheterization on September 13, which showed severe proximal LAD disease.  She had frequent runs of SVT during the procedure, which made angiography difficult, she was placed on amiodarone and subsequently underwent staged PCI drug-eluting stent placement of the proximal and mid LAD on September 16.  Following initiation of  amiodarone, she had significant improvement in SVT burden and she was discharged home on oral amiodarone therapy as well as dual antiplatelet therapy.  Eliquis was not resumed as it had been greater than 12 months since PE.  Unfortunately, she had significant nausea, anorexia, and lethargy on amio, and this was d/c'd @ October f/u visit, w/ resolution of GI symptoms but more frequent palpitations and elevated heart rates at home in the absence of sustained arrhythmias.  Dilt increased to 240 daily @ EP f/u appt on 10/24.     Since her last visit, she has not noticed as much tachycardia though heart rates will still fluctuate between the 60s and 90s for the most part.  She has been experiencing some upper airway wheezing and with this dyspnea.  She was seen by pulmonology this morning and prescribed azithromycin.  She was also advised to continue steroids, which do not appear on our list.  She has not been experiencing any chest pain.  She occasionally notes a brief palpitation which makes her very anxious.  She denies PND, orthopnea, dizziness, syncope,  edema, or early satiety. Objective  Home Medications    Current Outpatient Medications  Medication Sig Dispense Refill   acetaminophen (TYLENOL) 500 MG tablet Take 1,000 mg by mouth 3 (three) times daily as needed for mild pain.      ALPRAZolam (XANAX) 0.5 MG tablet Take 1 tablet (0.5 mg total) by mouth at bedtime as needed for sleep or anxiety. 30 tablet 0   aspirin 81 MG chewable tablet Chew 1 tablet (81 mg total) by mouth daily. 30 tablet 1   atorvastatin (LIPITOR) 80 MG tablet Take 1 tablet (80 mg total) by mouth daily. 90 tablet 0   Cholecalciferol (VITAMIN D) 2000 units tablet Take 2,000 Units by mouth daily.     clopidogrel (PLAVIX) 75 MG tablet Take 1 tablet (75 mg total) by mouth daily with breakfast. 90 tablet 0   Cyanocobalamin 1000 MCG/ML KIT Inject as directed every 30 (thirty) days.     diltiazem (CARDIZEM CD) 240 MG 24 hr capsule Take 1 capsule (240 mg total) by mouth daily. 90 capsule 3   Dupilumab 300 MG/2ML SOPN Inject 4 mLs into the skin every 14 (fourteen) days.     EPINEPHrine 0.3 mg/0.3 mL IJ SOAJ injection Inject 0.3 mg into the muscle as needed.     famotidine (PEPCID) 20 MG tablet Take 20 mg by mouth daily as needed.     hydroxychloroquine (PLAQUENIL) 200 MG tablet Take 1 tablet (200 mg total) by mouth daily. 1 tablet 0   leflunomide (ARAVA) 20 MG tablet Take 10 mg by mouth daily.     levalbuterol (XOPENEX) 0.63 MG/3ML nebulizer solution Take 0.63 mg by nebulization every 6 (six) hours as needed for wheezing.     meclizine (ANTIVERT) 12.5 MG tablet PLEASE SEE ATTACHED FOR DETAILED DIRECTIONS     montelukast (SINGULAIR) 10 MG tablet Take 10 mg by mouth daily as needed (for allergies.).      nystatin (MYCOSTATIN) 100000 UNIT/ML suspension Take 5 mLs by mouth as needed.     pantoprazole (PROTONIX) 40 MG tablet Take 1 tablet (40 mg total) by mouth 2 (two) times daily. (Patient taking differently: Take 40 mg by mouth daily.) 60 tablet 1   predniSONE (DELTASONE) 2.5 MG  tablet Take 1 tablet (2.5 mg total) by mouth daily with breakfast. 30 tablet 0   sertraline (ZOLOFT) 25 MG tablet Take 50 mg by mouth daily. 50 mg total  Simethicone 180 MG CAPS Take 1-2 capsules by mouth as needed.     triamcinolone (NASACORT) 55 MCG/ACT AERO nasal inhaler Place 2 sprays into the nose daily.     ZENPEP 25000-79000 units CPEP Take by mouth. Occasionally takes with a large meal (Patient not taking: Reported on 11/09/2022)     No current facility-administered medications for this visit.     Physical Exam    VS:  BP 124/74 (BP Location: Right Arm, Patient Position: Sitting, Cuff Size: Normal)   Pulse 87   Ht 5\' 3"  (1.6 m)   Wt 191 lb (86.6 kg)   SpO2 99%   BMI 33.83 kg/m  , BMI Body mass index is 33.83 kg/m.       Cardiac Rehabilitation Eligibility Assessment  The patient is ready to start cardiac rehabilitation from a cardiac standpoint.   GEN: Well nourished, well developed, in no acute distress. HEENT: normal. Neck: Supple, no JVD, carotid bruits, or masses. Cardiac: RRR, no murmurs, rubs, or gallops. No clubbing, cyanosis, edema.  Radials 2+/PT 2+ and equal bilaterally.  Respiratory:  Respirations regular and unlabored, anterior expiratory wheezing noted.  Otherwise clear to auscultation. GI: Soft, nontender, nondistended, BS + x 4. MS: no deformity or atrophy. Skin: warm and dry, no rash. Neuro:  Strength and sensation are intact. Psych: Normal affect.  Accessory Clinical Findings    ECG personally reviewed by me today - EKG Interpretation Date/Time:  Thursday December 07 2022 15:32:59 EST Ventricular Rate:  87 PR Interval:  120 QRS Duration:  96 QT Interval:  374 QTC Calculation: 450 R Axis:   -19  Text Interpretation: Sinus rhythm with Premature supraventricular complexes Nonspecific ST abnormality Confirmed by Nicolasa Ducking [phone]) on 12/07/2022 3:56:35 PM  - no acute changes.  Lab Results  Component Value Date   WBC 9.1 12/07/2022    HGB 10.5 (L) 12/07/2022   HCT 33.6 (L) 12/07/2022   MCV 104.3 (H) 12/07/2022   PLT 219 12/07/2022   Lab Results  Component Value Date   CREATININE 2.09 (H) 10/16/2022   BUN 12 10/16/2022   NA 141 10/16/2022   K 4.0 10/16/2022   CL 104 10/16/2022   CO2 23 10/16/2022   Lab Results  Component Value Date   ALT 11 10/11/2022   AST 15 10/11/2022   ALKPHOS 85 10/11/2022   BILITOT 0.4 10/11/2022   Lab Results  Component Value Date   TSH 3.085 09/28/2022       Assessment & Plan    1.  Coronary artery disease: Status post admission in September with unstable angina.  Diagnostic catheterization revealed severe proximal LAD disease, which was successfully treated with drug-eluting stent.  She has not been having any chest pain.  She has been having dyspnea on exertion in the setting of asthma flare, now on steroids and antibiotics.  Continue aspirin, statin, and Plavix.  2.  PSVT/atrial tachycardia: Previous evaluated by EP and SVT was felt to represent atrial tachycardia.  She had more frequent and sustained runs during hospitalization in September, and amiodarone was added but then subsequently discontinued secondary to anorexia, nausea, and malaise.  Though symptoms improved following discontinuation though she has had more frequent isolated palpitations or elevations in heart rates into the 90s.  She is generally asymptomatic but it makes her nervous when she sees the heart rate on her pulse oximeter.  Reassurance provided.  She is in sinus rhythm with PACs today.  Continue current dose of diltiazem, which was escalated to  240 mg daily at her last visit.  3.  Hyperlipidemia: Due for lipids and LFTs.  Lab is closed today but she will come back and have these drawn when she is fasting.  Continue statin therapy.  4.  Stage III chronic kidney disease: She is returning for complete metabolic panel.  5.  Macrocytic anemia: Followed by hematology and stable based on lab work earlier  today.  6.  Asthma: Currently in the setting of a flare and being treated with antibiotics and steroids.  Followed by pulmonology.  7.  Disposition: Follow-up complete metabolic panel and lipids.  Follow-up in clinic in 3 months or sooner if necessary.  She has electrophysiology follow-up in January.   Nicolasa Ducking, NP 12/07/2022, 5:22 PM

## 2022-12-07 NOTE — Patient Instructions (Signed)
Medication Instructions:  No changes *If you need a refill on your cardiac medications before your next appointment, please call your pharmacy*   Lab Work: Your provider would like for you to return in a few days to have the following labs drawn: CMET and Lipid.   Please go to [facility] [address] ([facility]) [address], [place] [address] You do not need an appointment.  They are open from 7:30 am-4 pm.  Lunch from 1:00 pm- 2:00 pm You will need to be fasting.  If you have labs (blood work) drawn today and your tests are completely normal, you will receive your results only by: MyChart Message (if you have MyChart) OR A paper copy in the mail If you have any lab test that is abnormal or we need to change your treatment, we will call you to review the results.   Testing/Procedures: None ordered   Follow-Up: At [facility], you and your health needs are our priority.  As part of our continuing mission to provide you with exceptional heart care, we have created designated Provider Care Teams.  These Care Teams include your primary Cardiologist (physician) and Advanced Practice Providers (APPs -  Physician Assistants and Nurse Practitioners) who all work together to provide you with the care you need, when you need it.  We recommend signing up for the patient portal called "MyChart".  Sign up information is provided on this After Visit Summary.  MyChart is used to connect with patients for Virtual Visits (Telemedicine).  Patients are able to view lab/test results, encounter notes, upcoming appointments, etc.  Non-urgent messages can be sent to your provider as well.   To learn more about what you can do with MyChart, go to [email or web address].    Your next appointment:   3 month(s)  Provider:   Yvonne Kendall, MD

## 2022-12-11 ENCOUNTER — Other Ambulatory Visit: Payer: Medicare Other

## 2022-12-12 ENCOUNTER — Inpatient Hospital Stay: Payer: Medicare Other | Admitting: Oncology

## 2022-12-12 ENCOUNTER — Inpatient Hospital Stay: Payer: Medicare Other

## 2022-12-18 ENCOUNTER — Telehealth: Payer: Self-pay | Admitting: Internal Medicine

## 2022-12-18 NOTE — Telephone Encounter (Signed)
She'll need to be evaluated.  Unfortunately, we can not see the CXR result.  Sounds like though tachycardia is occurring, dyspnea is persistent and progressive regardless of HR.  If profoundly dyspneic, will need ER eval.  Otherwise, perhaps she could be added onto a schedule on 12/3 for eval.

## 2022-12-18 NOTE — Telephone Encounter (Signed)
Spoke with the patient's daughter, who reported that the patient was seen on 12/07/22 and at that time, the patient was experiencing a little shortness of breath. She stated that since then, the patient was evaluated by pulmonary and tried a few medications. The daughter reported that the medication helped with wheezing, but not with the shortness of breath. However, she stated that the symptoms have worsened since Friday. The daughter mentioned that the patient is now short of breath with exertion and at rest. She denies any obvious swelling and stated that a chest X-ray done by the pulmonologist was negative. She also noted that during exertion, the patient has labored breathing, and the heart rate fluctuates between 50 and the 140s. The daughter expressed great concern due to the lack of improvement.  Will forward to NP for recommendations

## 2022-12-18 NOTE — Telephone Encounter (Signed)
Pt c/o Shortness Of Breath: STAT if SOB developed within the last 24 hours or pt is noticeably SOB on the phone  1. Are you currently SOB (can you hear that pt is SOB on the phone)?  On the phone with patient's daughter   2. How long have you been experiencing SOB?  Since about 11/23  3. Are you SOB when sitting or when up moving around?  Both   4. Are you currently experiencing any other symptoms?   HR fluctuating - 60's to 130/140's

## 2022-12-19 ENCOUNTER — Ambulatory Visit: Payer: Medicare Other | Admitting: Medical

## 2022-12-19 NOTE — Telephone Encounter (Signed)
Left a message for the patient to call back.  

## 2022-12-19 NOTE — Telephone Encounter (Signed)
Pt's daughter made aware of recommendations Appointment scheduled for today 12/19/22 with Cadence for further recommendations

## 2022-12-22 ENCOUNTER — Other Ambulatory Visit: Payer: Medicare Other

## 2022-12-26 ENCOUNTER — Inpatient Hospital Stay: Payer: Medicare Other

## 2022-12-26 ENCOUNTER — Inpatient Hospital Stay: Payer: Medicare Other | Admitting: Oncology

## 2022-12-29 ENCOUNTER — Ambulatory Visit: Payer: Medicare Other | Admitting: Medical

## 2022-12-29 NOTE — Progress Notes (Unsigned)
Cardiology Office Note:    Date:  12/29/2022   ID:  Courtney Wilcox, DOB Apr 06, 1945, MRN [id number]  PCP:  Courtney Paradise, MD  [facility] Cardiologist:  Courtney Kendall, MD  [facility] Electrophysiologist:  Courtney Prude, MD   Referring MD: Courtney Paradise, MD   Chief Complaint: ***  History of Present Illness:    Courtney Wilcox is a 77 y.o. female with a hx of PSVT, PE in November 2022, diastolic dysfunction, HLD, CKD stage 3, asthma w/ post-COVID cough, B12 deficiency macrocytic anemia, depression, anxiety and rheumatoid arthritis who presents for follow-up.  Patient underwent diagnostic cath in 2005 which showed normal coronary arteries.  More recently, she was seen by Dr. Lalla Brothers in June 2023 in the setting of PSVT, which which was felt to be atrial tachycardia.  She was switched from short acting to long-acting metoprolol with subsequent plan to place a loop monitor to assess overall SVT burden this was deferred due to perceived low burden which was felt to be contributing to her baseline dyspnea.  Echo in Tober 2023 showed EF of 60 to 65% with grade 2 diastolic dysfunction and mild to moderate MR.    Patient established with cardiology in January 2024.  She reported ongoing dyspnea and chest discomfort.  In April 2024 she was noted to have recurrent SVT.  A 14-day heart monitor was placed and subsequently showed 25,700 SVT runs lasting up to 40 minutes with a max rate of 222 bpm.  Average resting rate of 57.  She was switched from Toprol to diltiazem by EP.  In September 2024 she reported dyspnea and substernal chest tightness and was referred to the ER, she ruled out for MI.  She underwent diagnostic cath which showed severe proximal LAD disease.  She had frequent runs of SVT during the procedure, which made angiography difficult.  She was placed on amiodarone and subsequently underwent staged PCI DES placement of the proximal and mid LAD on September 16.   Following initiation of amiodarone, she had significant improvement in SVT burden and was discharged home on oral amiodarone therapy as well as DAPT.  Eliquis was not resumed as it had been greater than 12 months since PE.  Unfortunately, she had significant nausea, anorexia, lethargy on Amio and this was discontinued in October 2024.  Patient was last seen in November 2020 for reporting persistent dyspnea with recent evaluation by pulmonology.  Palpitations were overall improved, no changes were made.  Past Medical History:  Diagnosis Date   Allergic rhinitis    Anxiety    Arthritis    Asthma    Atrophic vaginitis    B12 deficiency    Bronchitis, chronic (HCC)    CAD (coronary artery disease)    a. 03/2003 Cath: Nl cors; b. 09/2022 Staged PCI: LM nl, LAD 85p/31m (3.5x26 Onyx Frontier DES), RI small, mild dzs, LCX nl, OM2 mild dzs, RCA 30p.   Cervical disc disease    CKD (chronic kidney disease), stage III (HCC)    Collagen vascular disease (HCC)    RA   Cough - post-COVID    Depression    Diastolic dysfunction    a. 11/2020 Echo: EF 45-50%, GrI DD; b. 10/2021 Echo: EF 60-65%, GrII DD; c. 09/2022 Echo: EF 55-60%, no rwma, GrI DD, nl RV fxn, mildly dil LA. No significant valvular dzs.   Fibrocystic breast disease    Foot ulcer (HCC)    GERD (gastroesophageal reflux disease)  Hammer toe    Hyperlipidemia    IBS (irritable bowel syndrome)    Macrocytic anemia    Macular degeneration 2023   Obesity    OSA (obstructive sleep apnea)    Osteoarthrosis    Multi site   Osteopenia    PSVT (paroxysmal supraventricular tachycardia) (HCC)    a. 04/2022 Zio: Sinus rhythm @ 57 (42-101). Rare PACs/PVCs. 16109 SVT runs lasting up to 47m 12sec - max rate 222. 5 beats NSVT. Triggered events = sinus, SVT, PACs.   Rheumatoid arthritis (HCC)    Shingles    Thyroid nodule    MULTIPLE   Tonsillitis    recurrent   Umbilical hernia     Past Surgical History:  Procedure Laterality Date   BACK  SURGERY  2011   LAMINECTOMY   BREAST BIOPSY Right    neg   CERVICAL FUSION  2011, 2012   X 2   COLONOSCOPY  2005, 2015   COLONOSCOPY WITH PROPOFOL N/A 08/04/2020   Procedure: COLONOSCOPY WITH PROPOFOL;  Surgeon: Earline Mayotte, MD;  Location: [facility] ENDOSCOPY;  Service: Endoscopy;  Laterality: N/A;   CORONARY STENT INTERVENTION N/A 10/02/2022   Procedure: CORONARY STENT INTERVENTION;  Surgeon: Iran Ouch, MD;  Location: [facility] INVASIVE CV LAB;  Service: Cardiovascular;  Laterality: N/A;   ESOPHAGOGASTRODUODENOSCOPY (EGD) WITH PROPOFOL N/A 09/26/2021   Procedure: ESOPHAGOGASTRODUODENOSCOPY (EGD) WITH PROPOFOL;  Surgeon: Jaynie Collins, DO;  Location: [facility] ENDOSCOPY;  Service: Gastroenterology;  Laterality: N/A;   ESOPHAGOGASTRODUODENOSCOPY ENDOSCOPY  2005, 2013, 2015   EYE SURGERY Bilateral    Cataract Extraction with IOL    HERNIA REPAIR     JOINT REPLACEMENT     KNEE ARTHROPLASTY Left 07/12/2016   Procedure: COMPUTER ASSISTED TOTAL KNEE ARTHROPLASTY;  Surgeon: Donato Heinz, MD;  Location: [facility] ORS;  Service: Orthopedics;  Laterality: Left;   Laryngeal tear after intubation w/repair     LUMBAR DISC SURGERY  12/2009   REPLACEMENT TOTAL KNEE Right 06/2007   DR. HOOTEN, [facility]   REPLACEMENT UNICONDYLAR JOINT KNEE     DR. HOOTEN, [facility]   RIGHT/LEFT HEART CATH AND CORONARY ANGIOGRAPHY N/A 09/29/2022   Procedure: RIGHT/LEFT HEART CATH AND CORONARY ANGIOGRAPHY;  Surgeon: Iran Ouch, MD;  Location: [facility] INVASIVE CV LAB;  Service: Cardiovascular;  Laterality: N/A;   SHOULDER ARTHROSCOPY WITH ROTATOR CUFF REPAIR Right    TONSILLECTOMY     TOTAL HIP ARTHROPLASTY Right 01/31/2017   Procedure: TOTAL HIP ARTHROPLASTY;  Surgeon: Donato Heinz, MD;  Location: [facility] ORS;  Service: Orthopedics;  Laterality: Right;   TUBAL LIGATION     UPPER GI ENDOSCOPY      Current Medications: No outpatient medications have been marked as taking for the 12/29/22 encounter (Appointment) with  Courtney Wilcox, Airelle Everding H, PA-C.     Allergies:   Amiodarone, Lactose, Other, Biaxin [clarithromycin], Hydrocodone-acetaminophen, Oxycodone, Sulfa antibiotics, and Sulfasalazine   Social History   Socioeconomic History   Marital status: Married    Spouse name: Jillyn Hidden   Number of children: 3   Years of education: Not on file   Highest education level: Not on file  Occupational History   Not on file  Tobacco Use   Smoking status: Never   Smokeless tobacco: Never  Vaping Use   Vaping status: Never Used  Substance and Sexual Activity   Alcohol use: No   Drug use: No   Sexual activity: Not on file  Other Topics Concern   Not on file  Social History  Narrative   Not on file   Social Drivers of Health   Financial Resource Strain: Not on file  Food Insecurity: No Food Insecurity (09/28/2022)   Hunger Vital Sign    Worried About Running Out of Food in the Last Year: Never true    Ran Out of Food in the Last Year: Never true  Transportation Needs: No Transportation Needs (09/28/2022)   PRAPARE - Administrator, Civil Service (Medical): No    Lack of Transportation (Non-Medical): No  Physical Activity: Not on file  Stress: Not on file  Social Connections: Not on file     Family History: The patient's ***family history includes Asthma in her mother; Breast cancer (age of onset: 51) in her mother; Coronary artery disease (age of onset: 27) in her father; Emphysema in her father; Lung cancer in her father.  ROS:   Please see the history of present illness.    *** All other systems reviewed and are negative.  EKGs/Labs/Other Studies Reviewed:    The following studies were reviewed today: ***  EKG:  EKG is *** ordered today.  The ekg ordered today demonstrates ***  Recent Labs: 09/27/2022: B Natriuretic Peptide 241.2; Magnesium 1.8 09/28/2022: TSH 3.085 10/11/2022: ALT 11 10/16/2022: BUN 12; Creatinine, Ser 2.09; Potassium 4.0; Sodium 141 12/07/2022: Hemoglobin 10.5; Platelet  Count 219  Recent Lipid Panel No results found for: "CHOL", "TRIG", "HDL", "CHOLHDL", "VLDL", "LDLCALC", "LDLDIRECT"   Risk Assessment/Calculations:   {Does this patient have ATRIAL FIBRILLATION?[phone]}   Physical Exam:    VS:  There were no vitals taken for this visit.    Wt Readings from Last 3 Encounters:  12/07/22 191 lb (86.6 kg)  11/09/22 187 lb (84.8 kg)  10/19/22 183 lb 6.4 oz (83.2 kg)     GEN: *** Well nourished, well developed in no acute distress HEENT: Normal NECK: No JVD; No carotid bruits LYMPHATICS: No lymphadenopathy CARDIAC: ***RRR, no murmurs, rubs, gallops RESPIRATORY:  Clear to auscultation without rales, wheezing or rhonchi  ABDOMEN: Soft, non-tender, non-distended MUSCULOSKELETAL:  No edema; No deformity  SKIN: Warm and dry NEUROLOGIC:  Alert and oriented x 3 PSYCHIATRIC:  Normal affect   ASSESSMENT:    No diagnosis found. PLAN:    In order of problems listed above:  ***  Disposition: Follow up {follow up:15908} with ***   Shared Decision Making/Informed Consent   {Are you ordering a CV Procedure (e.g. stress test, cath, DCCV, TEE, etc)?   Press F2        :[id number]}    Signed, Daelon Dunivan David Stall, PA-C  12/29/2022 9:01 AM    [facility]

## 2023-01-04 NOTE — Progress Notes (Signed)
Cardiology Office Note Date:  01/05/2023  Patient ID:  Courtney Wilcox Jul 05, 1945, MRN [id number] PCP:  Patrice Paradise, MD  Cardiologist:  Yvonne Kendall, MD Electrophysiologist: Lanier Prude, MD    Chief Complaint: SOB follow-up  History of Present Illness: Courtney Wilcox is a 77 y.o. female with PMH notable for CAD s/p PCI, SVT (atrial tach), DVT/PE, SOB, fibromyalgia; seen today for Lanier Prude, MD for routine electrophysiology followup.  I saw her 05/2022 for SVT follow-up where she continued to have increased SOB. Adjusted BB > dilt without improvement. She was eval'd by Dr. Okey Dupre who recommended LHC to futher eval. LHC showed severe prox LAD disease, procedure complicated by frequent runs of SVT. She was placed on amiodarone and underwent staged PCI on 9/16. Outpatient follow-up with NP Brion Aliment noted nausea, lethargy, anorexia and amiodarone stopped. She has had frequent visits the past two months with [facility] for ongoing SOB, most recently 12/12. They've made multiple medication changes without much improvement.   On follow-up today, she says her main complaint is a feeling of phlegm and congestion in her throat. She checks her pulse ox multiple times a day and notices that her pulse is highly variable - bouncing between 140s to 80s. It is usually between 80-100s. She and her daughter are worried about these readings for fear of being told that she'll need to restart amiodarone.  She is as active as she can be in the house, needing frequent breaks to complete tasks. She takes her diltiazem about mid-day and is very tired afterwards for 1-2 hours.    She denies chest pain, chest pressure.  Continues to take plavix daily, no missed doses. No bleeding concerns.     AAD History: Amiodarone - stopped d/t GI intolerance    ROS:  Please see the history of present illness. All other systems are reviewed and otherwise negative.   PHYSICAL EXAM:  VS:  BP 106/65    Pulse 92   Ht 5\' 3"  (1.6 m)   Wt 194 lb (88 kg)   SpO2 97%   BMI 34.37 kg/m  BMI: Body mass index is 34.37 kg/m.  Wt Readings from Last 3 Encounters:  01/05/23 194 lb (88 kg)  12/07/22 191 lb (86.6 kg)  11/09/22 187 lb (84.8 kg)    GEN- The patient is well appearing, alert and oriented x 3 today, hoarse voice Lungs- faint expiratory wheezing at bases, otherwise clear to ausculation bilaterally, normal work of breathing.  Heart- Irregularly irregular rate and rhythm, no murmurs, rubs or gallops Extremities- Trace peripheral edema, warm, dry   EKG is ordered. Personal review of EKG from today shows:    EKG Interpretation Date/Time:  Friday January 05 2023 13:54:52 EST Ventricular Rate:  92 PR Interval:  150 QRS Duration:  92 QT Interval:  388 QTC Calculation: 479 R Axis:   -23  Text Interpretation: sinus rhythm with occasional PACs and brief atrial tachycardia  Confirmed by Sherie Don [id number]) on 01/05/2023 1:58:46 PM     Additional studies reviewed include: Previous EP, cardiology notes.   Coronary stent intervention, 10/02/2022   Prox LAD lesion is 85% stenosed.   Prox LAD to Mid LAD lesion is 50% stenosed.   Prox RCA lesion is 30% stenosed.   A drug-eluting stent was successfully placed using a STENT ONYX FRONTIER 3.5X26.   Post intervention, there is a 10% residual stenosis.   Post intervention, there is a 0% residual stenosis.   Successful  drug-eluting stent placement to the ostial/proximal LAD.  The lesion is very eccentric and fibrotic in the proximal segment and thus there was a 10% residual stenosis and spite of high-pressure noncompliant balloon.   Recommendations: Dual antiplatelet therapy for at least 6 months. Aggressive treatment of risk factors. Hydrate overnight and check renal function in the morning.  68 mL of contrast was used for the procedure.  R/LHC, 09/29/2022   Prox RCA lesion is 30% stenosed.   Prox LAD lesion is 85% stenosed.   Prox  LAD to Mid LAD lesion is 50% stenosed.   1.  Severe one-vessel coronary artery disease involving the ostial/proximal LAD 2.  Left ventricular angiography was not performed.  EF was normal by echo.  Normal left ventricular end-diastolic pressure. 3.  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 4.  Frequent supraventricular tachycardia throughout the case which made angiography difficult.  TTE, 09/29/2022  1. Left ventricular ejection fraction, by estimation, is 55 to 60%. The left ventricle has normal function. The left ventricle has no regional wall motion abnormalities. Left ventricular diastolic parameters are consistent with Grade I diastolic dysfunction (impaired relaxation).   2. Right ventricular systolic function is normal. The right ventricular size is normal.   3. Left atrial size was mildly dilated.   4. The mitral valve is normal in structure. No evidence of mitral valve regurgitation.   5. The aortic valve is tricuspid. Aortic valve regurgitation is not visualized.   6. The inferior vena cava is normal in size with greater than 50% respiratory variability, suggesting right atrial pressure of 3 mmHg.   Cardiac telemetry, 06/02/2022   The patient was monitored for 14 days.   The predominant rhythm was sinus with an average rate of 57 bpm while in sinus rhythm (range 42-101 bpm).   There were rare PACs and PVCs.   25,709 episode of supraventricular tachycardia cardia occurred, lasting up to 40:12 minutes with a maximum rate of 222 bpm.   A single 5 beat run of nonsustained ventricular tachycardia was observed with a maximum rate of 174 bpm.   There was no prolonged pause.   Patient triggered events corresponded to sinus rhythm, SVT, and PACs.  TTE, 10/31/2021  1. Left ventricular ejection fraction, by estimation, is 60  to 65%. The left ventricle has normal function. The left ventricle has no regional wall motion abnormalities. There is mild left ventricular hypertrophy. Left ventricular diastolic parameters are consistent with Grade II diastolic dysfunction (pseudonormalization).   2. Right ventricular systolic function is normal. The right ventricular size is normal.   3. Left atrial size was severely dilated.   4. The mitral valve is normal in structure. Mild mitral valve regurgitation.   5. The aortic valve is tricuspid. Aortic valve regurgitation is not visualized.   6. The inferior vena cava is normal in size with greater than 50% respiratory variability, suggesting right atrial pressure of 3 mmHg.   ASSESSMENT AND PLAN:  #) SVT, likely atrial tach #) SOB #) asthma, appears uncontrolled Intolerant to amiodarone. Reassurance provided that medication will not be restarted. Consider RHC to further eval for pulm HTN Consider PPM and AV node ablation  Given that her overall ventricular rates are well-controlled at home between 80-100s, will continue 240mg  diltiazem daily Take dilt at night to see if helps with daytime  fatigue  #) CAD s/p PCI No chest pain, though is having SOB. Does not appear to be related to exertion Tolerating plavix, continue 75mg  daily Continue ASA, statin  #) anxiety Query whether uncontrolled anxiety is contributing to patient's overall symptoms She was previously on celexa, but this was switched to zoloft d/t drug-drug interaction Consider re-starting celexa, will defer to PCP    Current medicines are reviewed at length with the patient today.   The patient has concerns regarding her medicines.  The following changes were made today:   none  Labs/ tests ordered today include:  Orders Placed This Encounter  Procedures   EKG 12-Lead     Disposition: Follow up with Dr. Lalla Brothers as scheduled in 4 weeks    Signed, Sherie Don, NP  01/05/23  3:47 PM   Electrophysiology [facility]

## 2023-01-05 ENCOUNTER — Encounter: Payer: Self-pay | Admitting: Cardiology

## 2023-01-05 ENCOUNTER — Ambulatory Visit: Payer: Medicare Other | Attending: Cardiology | Admitting: Cardiology

## 2023-01-05 VITALS — BP 106/65 | HR 92 | Ht 63.0 in | Wt 194.0 lb

## 2023-01-05 DIAGNOSIS — I471 Supraventricular tachycardia, unspecified: Secondary | ICD-10-CM | POA: Insufficient documentation

## 2023-01-05 NOTE — Patient Instructions (Signed)
Medication Instructions:   diltiazem (CARDIZEM CD) 240 MG 24 hr capsule - Take a night  *If you need a refill on your cardiac medications before your next appointment, please call your pharmacy*  Lab Work: - None ordered  Testing/Procedures: - None ordered  Follow-Up: At [facility], you and your health needs are our priority.  As part of our continuing mission to provide you with exceptional heart care, we have created designated Provider Care Teams.  These Care Teams include your primary Cardiologist (physician) and Advanced Practice Providers (APPs -  Physician Assistants and Nurse Practitioners) who all work together to provide you with the care you need, when you need it.  Your next appointment:   Thursday 02/08/23 at 3:35 PM Provider:  Nicolasa Ducking, NP  Wednesday 02/14/23 at 2:20 PM Provider:  Steffanie Dunn, MD

## 2023-01-22 ENCOUNTER — Inpatient Hospital Stay: Payer: Medicare Other | Attending: Oncology | Admitting: Oncology

## 2023-01-22 ENCOUNTER — Inpatient Hospital Stay: Payer: Medicare Other

## 2023-01-22 ENCOUNTER — Encounter: Payer: Self-pay | Admitting: Oncology

## 2023-01-22 VITALS — BP 123/91 | HR 121 | Temp 97.4°F | Resp 18 | Wt 194.8 lb

## 2023-01-22 DIAGNOSIS — D638 Anemia in other chronic diseases classified elsewhere: Secondary | ICD-10-CM

## 2023-01-22 DIAGNOSIS — M069 Rheumatoid arthritis, unspecified: Secondary | ICD-10-CM | POA: Insufficient documentation

## 2023-01-22 DIAGNOSIS — Z79899 Other long term (current) drug therapy: Secondary | ICD-10-CM | POA: Diagnosis not present

## 2023-01-22 LAB — FERRITIN: Ferritin: 155 ng/mL (ref 11–307)

## 2023-01-22 LAB — CBC WITH DIFFERENTIAL (CANCER CENTER ONLY)
Abs Immature Granulocytes: 0.03 10*3/uL (ref 0.00–0.07)
Basophils Absolute: 0.1 10*3/uL (ref 0.0–0.1)
Basophils Relative: 1 %
Eosinophils Absolute: 0.2 10*3/uL (ref 0.0–0.5)
Eosinophils Relative: 2 %
HCT: 34.1 % — ABNORMAL LOW (ref 36.0–46.0)
Hemoglobin: 10.8 g/dL — ABNORMAL LOW (ref 12.0–15.0)
Immature Granulocytes: 0 %
Lymphocytes Relative: 14 %
Lymphs Abs: 1.1 10*3/uL (ref 0.7–4.0)
MCH: 33 pg (ref 26.0–34.0)
MCHC: 31.7 g/dL (ref 30.0–36.0)
MCV: 104.3 fL — ABNORMAL HIGH (ref 80.0–100.0)
Monocytes Absolute: 0.9 10*3/uL (ref 0.1–1.0)
Monocytes Relative: 11 %
Neutro Abs: 5.7 10*3/uL (ref 1.7–7.7)
Neutrophils Relative %: 72 %
Platelet Count: 190 10*3/uL (ref 150–400)
RBC: 3.27 MIL/uL — ABNORMAL LOW (ref 3.87–5.11)
RDW: 13.8 % (ref 11.5–15.5)
WBC Count: 7.9 10*3/uL (ref 4.0–10.5)
nRBC: 0 % (ref 0.0–0.2)

## 2023-01-22 LAB — RETIC PANEL
Immature Retic Fract: 10.5 % (ref 2.3–15.9)
RBC.: 3.26 MIL/uL — ABNORMAL LOW (ref 3.87–5.11)
Retic Count, Absolute: 40.4 10*3/uL (ref 19.0–186.0)
Retic Ct Pct: 1.2 % (ref 0.4–3.1)
Reticulocyte Hemoglobin: 32.2 pg (ref >27.9)

## 2023-01-22 LAB — IRON AND TIBC
Iron: 95 ug/dL (ref 28–170)
Saturation Ratios: 35 % — ABNORMAL HIGH (ref 10.4–31.8)
TIBC: 273 ug/dL (ref 250–450)
UIBC: 178 ug/dL

## 2023-01-22 NOTE — Progress Notes (Signed)
 Pt here for follow up. Reports that she has been having some breathing issues and is being followed by cardiology and pulmonology.

## 2023-01-22 NOTE — Progress Notes (Signed)
 Hematology/Oncology Progress note [phone] [phone]) [phone]     REASON FOR VISIT Follow up for anemia and thrombocytopenia.    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  Bone marrow biopsy showed Normocellular bone marrow (20-30%) with trilineage hematopoiesis and no increase in blasts no dysplasia, normal cytogenetics.  Hb is >10, no need for retacrit .  Lab Results  Component Value Date   HGB 10.8 (L) 01/22/2023   TIBC 273 01/22/2023   IRONPCTSAT 35 (H) 01/22/2023   FERRITIN 155 01/22/2023   Hb is >10, no need for EPO.      Rheumatoid arthritis (HCC) Patient is currently on leflunomide  and Plaquenil  Continue follow up with rheumatology.   Orders Placed This Encounter  Procedures   CBC with Differential (Cancer Center Only)    Standing Status:   Future    Number of Occurrences:   1    Expected Date:   01/22/2023    Expiration Date:   01/22/2024   Iron  and TIBC    Standing Status:   Future    Number of Occurrences:   1    Expected Date:   01/22/2023    Expiration Date:   01/22/2024   Ferritin    Standing Status:   Future    Number of Occurrences:   1    Expected Date:   01/22/2023    Expiration Date:   01/22/2024   Retic Panel    Standing Status:   Future    Number of Occurrences:   1    Expected Date:   01/22/2023    Expiration Date:   01/22/2024    Follow up in 3 months.  All questions were answered. The patient knows to call the clinic with any problems, questions or concerns.  Zelphia Cap, MD, PhD [facility] Hematology Oncology 01/22/2023      INTERVAL HISTORY Courtney Wilcox is a 78 y.o. female who has above history reviewed by me today presents for follow up visit for management of pancytopenia. She has Rheumatoid arthritis, follows up with rheumatology She is on Leflutamide and on plaquenil . Leflutamide dose has been decreased to 10mg  in December 2023.  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 She has chronic tachycardia- follows up with cardiology.  cough and shortness of breath- Dupixent injection.   Review of Systems  Constitutional:  Positive for fatigue. Negative for appetite change, chills and fever.  HENT:   Negative for hearing loss and voice change.   Eyes:  Negative for eye problems.  Respiratory:  Positive for cough and shortness of breath. Negative for chest tightness.   Cardiovascular:  Negative for chest pain.  Gastrointestinal:  Negative for abdominal distention and blood in stool.  Endocrine: Negative for hot flashes.  Genitourinary:  Negative for difficulty urinating and frequency.   Musculoskeletal:  Negative for arthralgias.  Skin:  Negative for itching and rash.  Neurological:  Negative for extremity weakness.  Hematological:  Negative for adenopathy.  Psychiatric/Behavioral:  Negative for confusion.       Allergies  Allergen Reactions   Amiodarone  Other (See Comments)    Fatigue, Nausea, anorexia, malaise   Lactose Other (See Comments) and Diarrhea   Other Other (See Comments)    Pt ONLY tolerates SLOW-FE (slow released iron )---upsets IBS   Biaxin [Clarithromycin] Other (See Comments)    GI upset    Hydrocodone -Acetaminophen  Itching    Tolerates  acetaminophen     Oxycodone  Itching   Sulfa Antibiotics Other (See Comments) and Diarrhea    GI upset  GI upset GI upset    Sulfasalazine Diarrhea     Past Medical History:  Diagnosis Date   Allergic rhinitis    Anxiety    Arthritis    Asthma    Atrophic vaginitis    B12 deficiency    Bronchitis, chronic (HCC)    CAD (coronary artery disease)    a. 03/2003 Cath: Nl cors; b. 09/2022 Staged PCI: LM nl, LAD 85p/18m (3.5x26 Onyx Frontier DES), RI small, mild dzs, LCX nl, OM2 mild dzs, RCA 30p.   Cervical disc disease    CKD (chronic kidney disease), stage III (HCC)    Collagen vascular disease (HCC)    RA    Cough - post-COVID    Depression    Diastolic dysfunction    a. 11/2020 Echo: EF 45-50%, GrI DD; b. 10/2021 Echo: EF 60-65%, GrII DD; c. 09/2022 Echo: EF 55-60%, no rwma, GrI DD, nl RV fxn, mildly dil LA. No significant valvular dzs.   Fibrocystic breast disease    Foot ulcer (HCC)    GERD (gastroesophageal reflux disease)    Hammer toe    Hyperlipidemia    IBS (irritable bowel syndrome)    Macrocytic anemia    Macular degeneration 2023   Obesity    OSA (obstructive sleep apnea)    Osteoarthrosis    Multi site   Osteopenia    PSVT (paroxysmal supraventricular tachycardia) (HCC)    a. 04/2022 Zio: Sinus rhythm @ 57 (42-101). Rare PACs/PVCs. 74290 SVT runs lasting up to 49m 12sec - max rate 222. 5 beats NSVT. Triggered events = sinus, SVT, PACs.   Rheumatoid arthritis (HCC)    Shingles    Thyroid  nodule    MULTIPLE   Tonsillitis    recurrent   Umbilical hernia      Past Surgical History:  Procedure Laterality Date   BACK SURGERY  2011   LAMINECTOMY   BREAST BIOPSY Right    neg   CERVICAL FUSION  2011, 2012   X 2   COLONOSCOPY  2005, 2015   COLONOSCOPY WITH PROPOFOL  N/A 08/04/2020   Procedure: COLONOSCOPY WITH PROPOFOL ;  Surgeon: Dessa Reyes ORN, MD;  Location: [facility] ENDOSCOPY;  Service: Endoscopy;  Laterality: N/A;   CORONARY STENT INTERVENTION N/A 10/02/2022   Procedure: CORONARY STENT INTERVENTION;  Surgeon: Darron Deatrice LABOR, MD;  Location: [facility] INVASIVE CV LAB;  Service: Cardiovascular;  Laterality: N/A;   ESOPHAGOGASTRODUODENOSCOPY (EGD) WITH PROPOFOL  N/A 09/26/2021   Procedure: ESOPHAGOGASTRODUODENOSCOPY (EGD) WITH PROPOFOL ;  Surgeon: Onita Elspeth Sharper, DO;  Location: [facility] ENDOSCOPY;  Service: Gastroenterology;  Laterality: N/A;   ESOPHAGOGASTRODUODENOSCOPY ENDOSCOPY  2005, 2013, 2015   EYE SURGERY Bilateral    Cataract Extraction with IOL    HERNIA REPAIR     JOINT REPLACEMENT     KNEE ARTHROPLASTY Left 07/12/2016   Procedure: COMPUTER ASSISTED TOTAL KNEE  ARTHROPLASTY;  Surgeon: Mardee Lynwood SQUIBB, MD;  Location: [facility] ORS;  Service: Orthopedics;  Laterality: Left;   Laryngeal tear after intubation w/repair     LUMBAR DISC SURGERY  12/2009   REPLACEMENT TOTAL KNEE Right 06/2007   DR. HOOTEN, [facility]   REPLACEMENT UNICONDYLAR JOINT KNEE     DR. HOOTEN, [facility]   RIGHT/LEFT HEART CATH AND CORONARY ANGIOGRAPHY N/A 09/29/2022   Procedure: RIGHT/LEFT HEART CATH AND CORONARY ANGIOGRAPHY;  Surgeon: Darron Deatrice LABOR, MD;  Location: [facility] INVASIVE CV LAB;  Service: Cardiovascular;  Laterality: N/A;   SHOULDER ARTHROSCOPY WITH ROTATOR CUFF REPAIR Right    TONSILLECTOMY     TOTAL HIP ARTHROPLASTY Right 01/31/2017   Procedure: TOTAL HIP ARTHROPLASTY;  Surgeon: Mardee Lynwood SQUIBB, MD;  Location: [facility] ORS;  Service: Orthopedics;  Laterality: Right;   TUBAL LIGATION     UPPER GI ENDOSCOPY      Social History   Socioeconomic History   Marital status: Married    Spouse name: Arley   Number of children: 3   Years of education: Not on file   Highest education level: Not on file  Occupational History   Not on file  Tobacco Use   Smoking status: Never   Smokeless tobacco: Never  Vaping Use   Vaping status: Never Used  Substance and Sexual Activity   Alcohol use: No   Drug use: No   Sexual activity: Not on file  Other Topics Concern   Not on file  Social History Narrative   Not on file   Social Drivers of Health   Financial Resource Strain: Not on file  Food Insecurity: No Food Insecurity (09/28/2022)   Hunger Vital Sign    Worried About Running Out of Food in the Last Year: Never true    Ran Out of Food in the Last Year: Never true  Transportation Needs: No Transportation Needs (09/28/2022)   PRAPARE - Administrator, Civil Service (Medical): No    Lack of Transportation (Non-Medical): No  Physical Activity: Not on file  Stress: Not on file  Social Connections: Not on file  Intimate Partner Violence: Not At Risk (09/28/2022)   Humiliation,  Afraid, Rape, and Kick questionnaire    Fear of Current or Ex-Partner: No    Emotionally Abused: No    Physically Abused: No    Sexually Abused: No    Family History  Problem Relation Age of Onset   Asthma Mother    Breast cancer Mother 78   Emphysema Father        smoked   Lung cancer Father        smoked   Coronary artery disease Father 16     Current Outpatient Medications:    acetaminophen  (TYLENOL ) 500 MG tablet, Take 1,000 mg by mouth 3 (three) times daily as needed for mild pain. , Disp: , Rfl:    ALPRAZolam  (XANAX ) 0.5 MG tablet, Take 1 tablet (0.5 mg total) by mouth at bedtime as needed for sleep or anxiety., Disp: 30 tablet, Rfl: 0   aspirin  81 MG chewable tablet, Chew 1 tablet (81 mg total) by mouth daily., Disp: 30 tablet, Rfl: 1   atorvastatin  (LIPITOR ) 80 MG tablet, Take 1 tablet (80 mg total) by mouth daily., Disp: 90 tablet, Rfl: 0   Cholecalciferol  (VITAMIN D ) 2000 units tablet, Take 2,000 Units by mouth daily., Disp: , Rfl:    clopidogrel  (PLAVIX ) 75 MG tablet, Take 1 tablet (75 mg total) by mouth daily with breakfast., Disp: 90 tablet, Rfl: 0   Cyanocobalamin 1000 MCG/ML KIT, Inject as directed every 30 (thirty) days., Disp: , Rfl:    diltiazem  (CARDIZEM  CD) 240 MG 24 hr capsule, Take 1 capsule (240 mg total) by mouth daily., Disp: 90 capsule, Rfl: 3   Dupilumab 300 MG/2ML SOPN, Inject 4 mLs into the skin every 14 (fourteen) days., Disp: , Rfl:    famotidine  (PEPCID ) 20 MG tablet, Take 20 mg by mouth daily as needed., Disp: , Rfl:    Fluticasone -Umeclidin-Vilant 100-62.5-25  MCG/ACT AEPB, Inhale into the lungs. Inhale 1 Puff into the lungs once daily, Disp: , Rfl:    hydroxychloroquine  (PLAQUENIL ) 200 MG tablet, Take 1 tablet (200 mg total) by mouth daily., Disp: 1 tablet, Rfl: 0   leflunomide  (ARAVA ) 10 MG tablet, Take 10 mg by mouth daily., Disp: , Rfl:    levalbuterol  (XOPENEX ) 0.63 MG/3ML nebulizer solution, Take 0.63 mg by nebulization every 6 (six) hours as  needed for wheezing., Disp: , Rfl:    meclizine (ANTIVERT) 12.5 MG tablet, PLEASE SEE ATTACHED FOR DETAILED DIRECTIONS, Disp: , Rfl:    montelukast  (SINGULAIR ) 10 MG tablet, Take 10 mg by mouth daily as needed (for allergies.). , Disp: , Rfl:    nystatin  (MYCOSTATIN ) 100000 UNIT/ML suspension, Take 5 mLs by mouth as needed., Disp: , Rfl:    pantoprazole  (PROTONIX ) 40 MG tablet, Take 1 tablet (40 mg total) by mouth 2 (two) times daily. (Patient taking differently: Take 40 mg by mouth daily.), Disp: 60 tablet, Rfl: 1   predniSONE  (DELTASONE ) 2.5 MG tablet, Take 1 tablet (2.5 mg total) by mouth daily with breakfast., Disp: 30 tablet, Rfl: 0   sertraline  (ZOLOFT ) 25 MG tablet, Take 50 mg by mouth daily. 50 mg total, Disp: , Rfl:    Simethicone  180 MG CAPS, Take 1-2 capsules by mouth as needed., Disp: , Rfl:    triamcinolone  (NASACORT ) 55 MCG/ACT AERO nasal inhaler, Place 2 sprays into the nose daily., Disp: , Rfl:    EPINEPHrine  0.3 mg/0.3 mL IJ SOAJ injection, Inject 0.3 mg into the muscle as needed. (Patient not taking: Reported on 01/22/2023), Disp: , Rfl:    ZENPEP 25000-79000 units CPEP, Take by mouth. Occasionally takes with a large meal, Disp: , Rfl:   Physical exam:  Vitals:   01/22/23 1401  BP: (!) 123/91  Pulse: (!) 121  Resp: 18  Temp: (!) 97.4 F (36.3 C)  SpO2: 98%  Weight: 194 lb 12.8 oz (88.4 kg)   Physical Exam Constitutional:      General: She is not in acute distress.    Comments: She ambulates independantly  HENT:     Head: Normocephalic and atraumatic.  Eyes:     General: No scleral icterus. Cardiovascular:     Rate and Rhythm: Normal rate.  Pulmonary:     Effort: Pulmonary effort is normal. No respiratory distress.     Breath sounds: No wheezing.     Comments: Decreased breath sounds bilaterally.  Abdominal:     General: There is no distension.     Palpations: Abdomen is soft.  Musculoskeletal:        General: Normal range of motion.     Cervical back: Normal  range of motion and neck supple.  Skin:    General: Skin is warm.  Neurological:     Mental Status: She is alert and oriented to person, place, and time. Mental status is at baseline.  Psychiatric:        Mood and Affect: Mood normal.        Latest Ref Rng & Units 10/16/2022    3:53 PM  CMP  Glucose 70 - 99 mg/dL 89   BUN 8 - 27 mg/dL 12   Creatinine 9.42 - 1.00 mg/dL 7.90   Sodium 865 - 855 mmol/L 141   Potassium 3.5 - 5.2 mmol/L 4.0   Chloride 96 - 106 mmol/L 104   CO2 20 - 29 mmol/L 23   Calcium  8.7 - 10.3 mg/dL 9.0  Latest Ref Rng & Units 01/22/2023    2:48 PM  CBC  WBC 4.0 - 10.5 K/uL 7.9   Hemoglobin 12.0 - 15.0 g/dL 89.1   Hematocrit 63.9 - 46.0 % 34.1   Platelets 150 - 400 K/uL 190     RADIOGRAPHIC STUDIES: I have personally reviewed the radiological images as listed and agreed with the findings in the report. No results found.

## 2023-01-22 NOTE — Assessment & Plan Note (Addendum)
 Anemia due to chronic disease, inflammation, CKD.  Bone marrow biopsy showed Normocellular bone marrow (20-30%) with trilineage hematopoiesis and no increase in blasts no dysplasia, normal cytogenetics.  Hb is >10, no need for retacrit .  Lab Results  Component Value Date   HGB 10.8 (L) 01/22/2023   TIBC 273 01/22/2023   IRONPCTSAT 35 (H) 01/22/2023   FERRITIN 155 01/22/2023   Hb is >10, no need for EPO.

## 2023-01-22 NOTE — Assessment & Plan Note (Signed)
Patient is currently on leflunomide and Plaquenil. Continue follow up with rheumatology.

## 2023-01-22 NOTE — Addendum Note (Signed)
 Addended by: [name] on: 01/22/2023 09:00 PM   Modules accepted: Orders

## 2023-01-23 ENCOUNTER — Telehealth: Payer: Self-pay

## 2023-01-23 NOTE — Telephone Encounter (Signed)
-----   Message from [name] sent at 01/22/2023  8:59 PM EST ----- Please let patient/daughter know that Hb is stable, slightly better comparing to her level in Nov.  No need for Venofer or retacrit.  Follow up plan is 3 months lab prior to MD +/- Venofer

## 2023-01-23 NOTE — Telephone Encounter (Signed)
 Called pt and unable to leave message. Called Carrie's phone (daughter) and left VM with MD recommendation and follow up plan.   Please schedule and notify pt:  3 months: Labs prior to MD/venofer.

## 2023-01-24 ENCOUNTER — Encounter: Payer: Self-pay | Admitting: Oncology

## 2023-02-08 ENCOUNTER — Ambulatory Visit: Payer: Medicare Other | Admitting: Nurse Practitioner

## 2023-02-14 ENCOUNTER — Ambulatory Visit: Payer: Medicare Other | Admitting: Cardiology

## 2023-02-24 ENCOUNTER — Other Ambulatory Visit: Payer: Self-pay | Admitting: Nurse Practitioner

## 2023-03-01 ENCOUNTER — Ambulatory Visit: Payer: Medicare Other | Admitting: Cardiology

## 2023-03-12 ENCOUNTER — Ambulatory Visit: Payer: Medicare Other | Attending: Internal Medicine | Admitting: Internal Medicine

## 2023-03-12 ENCOUNTER — Encounter: Payer: Self-pay | Admitting: Internal Medicine

## 2023-03-12 VITALS — BP 106/70 | HR 106 | Ht 64.0 in | Wt 193.8 lb

## 2023-03-12 DIAGNOSIS — I471 Supraventricular tachycardia, unspecified: Secondary | ICD-10-CM | POA: Diagnosis not present

## 2023-03-12 DIAGNOSIS — I2510 Atherosclerotic heart disease of native coronary artery without angina pectoris: Secondary | ICD-10-CM | POA: Diagnosis not present

## 2023-03-12 DIAGNOSIS — E785 Hyperlipidemia, unspecified: Secondary | ICD-10-CM | POA: Insufficient documentation

## 2023-03-12 DIAGNOSIS — I4719 Other supraventricular tachycardia: Secondary | ICD-10-CM

## 2023-03-12 NOTE — Progress Notes (Unsigned)
  Cardiology Office Note:  .   Date:  03/12/2023  ID:  Courtney Wilcox, DOB Nov 22, 1945, MRN [id number] PCP: Patrice Paradise, MD  [facility] Providers Cardiologist:  Yvonne Kendall, MD Electrophysiologist:  Lanier Prude, MD { Click to update primary MD,subspecialty MD or APP then REFRESH:1}    History of Present Illness: .   Courtney Wilcox is a 78 y.o. female with history of coronary artery disease status post PCI to the ostial/proximal LAD (09/2022), supraventricular tachycardia, pulmonary embolism (11/2020), rheumatoid arthritis, and obstructive sleep apnea, who presents for follow-up of dyspnea and SVT.  She was last seen in our office in 11/2022 by Ward Givens, NP, at which time she complained primarily of respiratory symptoms including upper airway wheezing and dyspnea.  She denied chest pain.  She had been on amiodarone around the time of her PCI to the LAD in 09/2022, though this was subsequently stopped due to GI side effects.  She was seen by Sherie Don, NP, of electrophysiology in December at which time she was started on diltiazem.  HR a bit better.  Usually under 100 bpm.  No palpitations.  No CP.  Thinks her breathing has gotten better.  In November had a lot of asthma.  Still has some DOE.  Could walk to waiting room and then need to stop due to SOB.  Also limited by back pain.  No LE edema.  BP's have been mostly below 120 SBP.  Nausea has resolved off amio.    ROS: See HPI  Studies Reviewed: Marland Kitchen   EKG Interpretation Date/Time:  Monday March 12 2023 15:00:43 EST Ventricular Rate:  106 PR Interval:    QRS Duration:  94 QT Interval:  350 QTC Calculation: 464 R Axis:   -32  Text Interpretation: Multifocal atrial tachycardia Left axis deviation When compared with ECG of 05-Jan-2023 13:54, HEART RATE has increased Confirmed by Lakea Mittelman, Cristal Deer [phone]) on 03/12/2023 3:07:09 PM    *** Risk Assessment/Calculations:   {Does this patient have ATRIAL  FIBRILLATION?:[phone]}         Physical Exam:   VS:  BP 106/70 (BP Location: Left Arm, Patient Position: Sitting, Cuff Size: Normal)   Pulse (!) 106   Ht 5\' 4"  (1.626 m)   Wt 193 lb 12.8 oz (87.9 kg)   SpO2 96%   BMI 33.27 kg/m    Wt Readings from Last 3 Encounters:  03/12/23 193 lb 12.8 oz (87.9 kg)  01/22/23 194 lb 12.8 oz (88.4 kg)  01/05/23 194 lb (88 kg)    General:  NAD. Neck: No JVD or HJR. Lungs: Clear to auscultation bilaterally without wheezes or crackles. Heart: Regular rate and rhythm without murmurs, rubs, or gallops. Abdomen: Soft, nontender, nondistended. Extremities: No lower extremity edema.  ASSESSMENT AND PLAN: .    ***    {Are you ordering a CV Procedure (e.g. stress test, cath, DCCV, TEE, etc)?   Press F2        :[id number]}  Dispo: ***  Signed, Yvonne Kendall, MD

## 2023-03-12 NOTE — Patient Instructions (Signed)
 Medication Instructions:  Your physician recommends that you continue on your current medications as directed. Please refer to the Current Medication list given to you today.   *If you need a refill on your cardiac medications before your next appointment, please call your pharmacy*   Lab Work: No labs ordered today    Testing/Procedures: No test ordered today    Follow-Up: At [facility], you and your health needs are our priority.  As part of our continuing mission to provide you with exceptional heart care, we have created designated Provider Care Teams.  These Care Teams include your primary Cardiologist (physician) and Advanced Practice Providers (APPs -  Physician Assistants and Nurse Practitioners) who all work together to provide you with the care you need, when you need it.  We recommend signing up for the patient portal called "MyChart".  Sign up information is provided on this After Visit Summary.  MyChart is used to connect with patients for Virtual Visits (Telemedicine).  Patients are able to view lab/test results, encounter notes, upcoming appointments, etc.  Non-urgent messages can be sent to your provider as well.   To learn more about what you can do with MyChart, go to [email or web address].    Your next appointment:   6 month(s)  Provider:   You may see Yvonne Kendall, MD or one of the following Advanced Practice Providers on your designated Care Team:   Nicolasa Ducking, NP Eula Listen, PA-C Cadence Fransico Michael, PA-C Charlsie Quest, NP Carlos Levering, NP

## 2023-03-14 ENCOUNTER — Encounter: Payer: Self-pay | Admitting: Internal Medicine

## 2023-03-16 ENCOUNTER — Other Ambulatory Visit: Payer: Self-pay | Admitting: Physician Assistant

## 2023-03-16 DIAGNOSIS — R7989 Other specified abnormal findings of blood chemistry: Secondary | ICD-10-CM

## 2023-03-21 ENCOUNTER — Ambulatory Visit: Payer: Medicare Other

## 2023-03-28 ENCOUNTER — Ambulatory Visit
Admission: RE | Admit: 2023-03-28 | Discharge: 2023-03-28 | Disposition: A | Discharge status: Home / Self Care | Source: Ambulatory Visit | Attending: Physician Assistant | Admitting: Physician Assistant

## 2023-03-28 DIAGNOSIS — R7989 Other specified abnormal findings of blood chemistry: Secondary | ICD-10-CM | POA: Diagnosis present

## 2023-04-11 ENCOUNTER — Ambulatory Visit: Payer: Medicare Other | Admitting: Cardiology

## 2023-04-24 ENCOUNTER — Other Ambulatory Visit: Payer: Medicare Other

## 2023-04-24 ENCOUNTER — Inpatient Hospital Stay
Admission: EM | Admit: 2023-04-24 | Discharge: 2023-04-27 | DRG: 189 | Disposition: A | Discharge status: Home Health | Attending: Internal Medicine | Admitting: Internal Medicine

## 2023-04-24 DIAGNOSIS — I2510 Atherosclerotic heart disease of native coronary artery without angina pectoris: Secondary | ICD-10-CM | POA: Diagnosis present

## 2023-04-24 DIAGNOSIS — K219 Gastro-esophageal reflux disease without esophagitis: Secondary | ICD-10-CM | POA: Diagnosis present

## 2023-04-24 DIAGNOSIS — J9601 Acute respiratory failure with hypoxia: Secondary | ICD-10-CM | POA: Diagnosis present

## 2023-04-24 DIAGNOSIS — I083 Combined rheumatic disorders of mitral, aortic and tricuspid valves: Secondary | ICD-10-CM | POA: Diagnosis present

## 2023-04-24 DIAGNOSIS — Z886 Allergy status to analgesic agent status: Secondary | ICD-10-CM

## 2023-04-24 DIAGNOSIS — E1122 Type 2 diabetes mellitus with diabetic chronic kidney disease: Secondary | ICD-10-CM | POA: Diagnosis present

## 2023-04-24 DIAGNOSIS — Z888 Allergy status to other drugs, medicaments and biological substances status: Secondary | ICD-10-CM

## 2023-04-24 DIAGNOSIS — F32A Depression, unspecified: Secondary | ICD-10-CM | POA: Diagnosis present

## 2023-04-24 DIAGNOSIS — Z7952 Long term (current) use of systemic steroids: Secondary | ICD-10-CM

## 2023-04-24 DIAGNOSIS — Z7902 Long term (current) use of antithrombotics/antiplatelets: Secondary | ICD-10-CM

## 2023-04-24 DIAGNOSIS — R55 Syncope and collapse: Secondary | ICD-10-CM | POA: Diagnosis present

## 2023-04-24 DIAGNOSIS — E785 Hyperlipidemia, unspecified: Secondary | ICD-10-CM | POA: Diagnosis present

## 2023-04-24 DIAGNOSIS — M8580 Other specified disorders of bone density and structure, unspecified site: Secondary | ICD-10-CM | POA: Diagnosis present

## 2023-04-24 DIAGNOSIS — J9811 Atelectasis: Secondary | ICD-10-CM | POA: Diagnosis present

## 2023-04-24 DIAGNOSIS — U099 Post covid-19 condition, unspecified: Secondary | ICD-10-CM | POA: Diagnosis present

## 2023-04-24 DIAGNOSIS — R059 Cough, unspecified: Secondary | ICD-10-CM | POA: Diagnosis present

## 2023-04-24 DIAGNOSIS — I471 Supraventricular tachycardia, unspecified: Secondary | ICD-10-CM | POA: Diagnosis present

## 2023-04-24 DIAGNOSIS — Z6833 Body mass index (BMI) 33.0-33.9, adult: Secondary | ICD-10-CM

## 2023-04-24 DIAGNOSIS — J42 Unspecified chronic bronchitis: Secondary | ICD-10-CM | POA: Diagnosis present

## 2023-04-24 DIAGNOSIS — Z801 Family history of malignant neoplasm of trachea, bronchus and lung: Secondary | ICD-10-CM

## 2023-04-24 DIAGNOSIS — F419 Anxiety disorder, unspecified: Secondary | ICD-10-CM | POA: Diagnosis present

## 2023-04-24 DIAGNOSIS — E669 Obesity, unspecified: Secondary | ICD-10-CM | POA: Diagnosis present

## 2023-04-24 DIAGNOSIS — I214 Non-ST elevation (NSTEMI) myocardial infarction: Secondary | ICD-10-CM

## 2023-04-24 DIAGNOSIS — I493 Ventricular premature depolarization: Secondary | ICD-10-CM | POA: Diagnosis present

## 2023-04-24 DIAGNOSIS — Z955 Presence of coronary angioplasty implant and graft: Secondary | ICD-10-CM

## 2023-04-24 DIAGNOSIS — I4892 Unspecified atrial flutter: Secondary | ICD-10-CM | POA: Diagnosis present

## 2023-04-24 DIAGNOSIS — I472 Ventricular tachycardia, unspecified: Secondary | ICD-10-CM | POA: Diagnosis present

## 2023-04-24 DIAGNOSIS — Z885 Allergy status to narcotic agent status: Secondary | ICD-10-CM

## 2023-04-24 DIAGNOSIS — D696 Thrombocytopenia, unspecified: Secondary | ICD-10-CM | POA: Diagnosis present

## 2023-04-24 DIAGNOSIS — Z79899 Other long term (current) drug therapy: Secondary | ICD-10-CM

## 2023-04-24 DIAGNOSIS — D539 Nutritional anemia, unspecified: Secondary | ICD-10-CM | POA: Diagnosis present

## 2023-04-24 DIAGNOSIS — Z825 Family history of asthma and other chronic lower respiratory diseases: Secondary | ICD-10-CM

## 2023-04-24 DIAGNOSIS — I129 Hypertensive chronic kidney disease with stage 1 through stage 4 chronic kidney disease, or unspecified chronic kidney disease: Secondary | ICD-10-CM | POA: Diagnosis present

## 2023-04-24 DIAGNOSIS — Z881 Allergy status to other antibiotic agents status: Secondary | ICD-10-CM

## 2023-04-24 DIAGNOSIS — I4719 Other supraventricular tachycardia: Secondary | ICD-10-CM | POA: Diagnosis present

## 2023-04-24 DIAGNOSIS — Z96653 Presence of artificial knee joint, bilateral: Secondary | ICD-10-CM | POA: Diagnosis present

## 2023-04-24 DIAGNOSIS — Z803 Family history of malignant neoplasm of breast: Secondary | ICD-10-CM

## 2023-04-24 DIAGNOSIS — E872 Acidosis, unspecified: Secondary | ICD-10-CM | POA: Diagnosis present

## 2023-04-24 DIAGNOSIS — N1832 Chronic kidney disease, stage 3b: Secondary | ICD-10-CM | POA: Diagnosis present

## 2023-04-24 DIAGNOSIS — G4733 Obstructive sleep apnea (adult) (pediatric): Secondary | ICD-10-CM | POA: Diagnosis present

## 2023-04-24 DIAGNOSIS — M069 Rheumatoid arthritis, unspecified: Secondary | ICD-10-CM | POA: Diagnosis present

## 2023-04-24 DIAGNOSIS — Z96641 Presence of right artificial hip joint: Secondary | ICD-10-CM | POA: Diagnosis present

## 2023-04-24 DIAGNOSIS — Z7982 Long term (current) use of aspirin: Secondary | ICD-10-CM

## 2023-04-24 DIAGNOSIS — R002 Palpitations: Secondary | ICD-10-CM | POA: Diagnosis present

## 2023-04-24 DIAGNOSIS — Z981 Arthrodesis status: Secondary | ICD-10-CM

## 2023-04-24 DIAGNOSIS — J988 Other specified respiratory disorders: Principal | ICD-10-CM

## 2023-04-24 DIAGNOSIS — J8283 Eosinophilic asthma: Secondary | ICD-10-CM | POA: Diagnosis present

## 2023-04-24 DIAGNOSIS — A419 Sepsis, unspecified organism: Secondary | ICD-10-CM

## 2023-04-24 DIAGNOSIS — R6510 Systemic inflammatory response syndrome (SIRS) of non-infectious origin without acute organ dysfunction: Secondary | ICD-10-CM

## 2023-04-24 DIAGNOSIS — I2489 Other forms of acute ischemic heart disease: Secondary | ICD-10-CM | POA: Diagnosis present

## 2023-04-24 DIAGNOSIS — Z91011 Allergy to milk products: Secondary | ICD-10-CM

## 2023-04-24 DIAGNOSIS — Z86711 Personal history of pulmonary embolism: Secondary | ICD-10-CM | POA: Diagnosis present

## 2023-04-24 DIAGNOSIS — J9621 Acute and chronic respiratory failure with hypoxia: Secondary | ICD-10-CM | POA: Diagnosis not present

## 2023-04-24 DIAGNOSIS — J45901 Unspecified asthma with (acute) exacerbation: Secondary | ICD-10-CM | POA: Diagnosis present

## 2023-04-24 DIAGNOSIS — I4891 Unspecified atrial fibrillation: Secondary | ICD-10-CM | POA: Diagnosis present

## 2023-04-24 DIAGNOSIS — Z8249 Family history of ischemic heart disease and other diseases of the circulatory system: Secondary | ICD-10-CM

## 2023-04-24 NOTE — ED Provider Notes (Addendum)
 [facility] Provider Note    Event Date/Time   First MD Initiated Contact with Patient 04/24/23 2303     (approximate)   History   Shortness of Breath and Near Syncope   HPI  Courtney Wilcox is a 78 y.o. female   Past medical history of PE no longer on anticoagulation, CAD on Plavix/aspirin, CKD, SVT on diltiazem, and a recent diagnosis of pneumonia started on a Z-Pak, got an intramuscular steroid injection yesterday, here with worsening shortness of breath this past evening around 10:00.  She had a fever yesterday of 100.4 as well.  She also has chest pain.  She felt acutely worsening shortness of breath around 10:00 and since then has appeared more confused per daughters who are at bedside to give collateral information.  She did not take her diltiazem this evening.  Independent Historian contributed to assessment above: Both daughters at bedside to corroborate information past medical history as above       Physical Exam   Triage Vital Signs: ED Triage Vitals  Encounter Vitals Group     BP 04/24/23 2305 (!) 145/68     Systolic BP Percentile --      Diastolic BP Percentile --      Pulse Rate 04/24/23 2259 100     Resp 04/24/23 2259 20     Temp 04/24/23 2259 99 F (37.2 C)     Temp Source 04/24/23 2259 Oral     SpO2 04/24/23 2305 100 %     Weight 04/24/23 2304 193 lb 12.6 oz (87.9 kg)     Height 04/24/23 2304 5\' 4"  (1.626 m)     Head Circumference --      Peak Flow --      Pain Score 04/24/23 2300 10     Pain Loc --      Pain Education --      Exclude from Growth Chart --     Most recent vital signs: Vitals:   04/25/23 0300 04/25/23 0330  BP: (!) 120/52 98/63  Pulse: 97 90  Resp: (!) 24 (!) 31  Temp:    SpO2: 100% 100%    General: Awake, no distress.  CV:  Good peripheral perfusion.  Resp:  Normal effort.  Abd:  No distention.  Other:  Has increased work of breathing and tachypnea to 29.  She is tachycardic to 130s.  She  is normotensive/hypertensive and she has a slight fever at 100.9.  She has a soft benign abdominal exam and no significant peripheral edema.  She has rhonchi to the right greater than left.   ED Results / Procedures / Treatments   Labs (all labs ordered are listed, but only abnormal results are displayed) Labs Reviewed  BASIC METABOLIC PANEL WITH GFR - Abnormal; Notable for the following components:      Result Value   CO2 20 (*)    Glucose, Bld 157 (*)    BUN 30 (*)    Creatinine, Ser 1.72 (*)    GFR, Estimated 31 (*)    All other components within normal limits  CBC - Abnormal; Notable for the following components:   RBC 3.57 (*)    Hemoglobin 11.8 (*)    MCV 101.4 (*)    All other components within normal limits  HEPATIC FUNCTION PANEL - Abnormal; Notable for the following components:   Albumin 3.2 (*)    AST 57 (*)    Alkaline Phosphatase 141 (*)  All other components within normal limits  BLOOD GAS, VENOUS - Abnormal; Notable for the following components:   pCO2, Ven 37 (*)    pO2, Ven 48 (*)    Acid-base deficit 4.0 (*)    All other components within normal limits  CBG MONITORING, ED - Abnormal; Notable for the following components:   Glucose-Capillary 139 (*)    All other components within normal limits  TROPONIN I (HIGH SENSITIVITY) - Abnormal; Notable for the following components:   Troponin I (High Sensitivity) 186 (*)    All other components within normal limits  TROPONIN I (HIGH SENSITIVITY) - Abnormal; Notable for the following components:   Troponin I (High Sensitivity) 317 (*)    All other components within normal limits  RESP PANEL BY RT-PCR (RSV, FLU A&B, COVID)  RVPGX2  CULTURE, BLOOD (ROUTINE X 2)  CULTURE, BLOOD (ROUTINE X 2)  LACTIC ACID, PLASMA  LACTIC ACID, PLASMA  URINALYSIS, ROUTINE W REFLEX MICROSCOPIC  HEPARIN LEVEL (UNFRACTIONATED)     I ordered and reviewed the above labs they are notable for blood glucose is normal, creatinine is  1.7.  EKG  ED ECG REPORT I, Pilar Jarvis, the attending physician, personally viewed and interpreted this ECG.   Date: 04/24/2023  EKG Time: 2343  Rate: 151  Rhythm: Sinus tachycardia  Axis: lad  Intervals:nl  ST&T Change: no stemi    RADIOLOGY I independently reviewed and interpreted the chest x-ray and see some right sided middle lobe infiltrates concerning for pneumonia I also reviewed radiologist's formal read.   PROCEDURES:  Critical Care performed: Yes, see critical care procedure note(s)  .Critical Care  Performed by: Pilar Jarvis, MD Authorized by: Pilar Jarvis, MD   Critical care provider statement:    Critical care time (minutes):  40   Critical care was time spent personally by me on the following activities:  Development of treatment plan with patient or surrogate, discussions with consultants, evaluation of patient's response to treatment, examination of patient, ordering and review of laboratory studies, ordering and review of radiographic studies, ordering and performing treatments and interventions, pulse oximetry, re-evaluation of patient's condition and review of old charts    MEDICATIONS ORDERED IN ED: Medications  heparin ADULT infusion 100 units/mL (25000 units/278mL) (900 Units/hr Intravenous New Bag/Given 04/25/23 0243)  sodium chloride 0.9 % bolus 1,000 mL (0 mLs Intravenous Stopped 04/25/23 0223)  diltiazem (CARDIZEM) injection 10 mg (10 mg Intravenous Given 04/25/23 0147)  azithromycin (ZITHROMAX) 500 mg in sodium chloride 0.9 % 250 mL IVPB (500 mg Intravenous New Bag/Given 04/25/23 0241)  cefTRIAXone (ROCEPHIN) 1 g in sodium chloride 0.9 % 100 mL IVPB (0 g Intravenous Stopped 04/25/23 0221)  iohexol (OMNIPAQUE) 350 MG/ML injection 75 mL (75 mLs Intravenous Contrast Given 04/25/23 0116)  heparin bolus via infusion 4,000 Units (4,000 Units Intravenous Bolus from Bag 04/25/23 0243)  sodium chloride 0.9 % bolus 500 mL (500 mLs Intravenous New Bag/Given 04/25/23 0326)   acetaminophen (TYLENOL) tablet 650 mg (650 mg Oral Given 04/25/23 0323)  aspirin chewable tablet 324 mg (324 mg Oral Given 04/25/23 0323)    External physician / consultants:  I spoke with hospital medicine for admission and regarding care plan for this patient.   IMPRESSION / MDM / ASSESSMENT AND PLAN / ED COURSE  I reviewed the triage vital signs and the nursing notes.  Patient's presentation is most consistent with acute presentation with potential threat to life or bodily function.  Differential diagnosis includes, but is not limited to, acute respiratory failure, respiratory infection, sepsis, PE, ACS   The patient is on the cardiac monitor to evaluate for evidence of arrhythmia and/or significant heart rate changes.  MDM:    She had a pneumonia diagnosed yesterday I do think that she has a respiratory infection given her fever, and rhonchi to the right lung fields and infiltrates on the right side of chest x-ray on my read suspicious for pneumonia.  She meets sepsis criteria with tachycardia fever and respiratory rate and thus she was given her 30 cc/kg ideal body weight fluid bolus as well as community-acquired pneumonia coverage antibiotics.  I put her on BiPAP to her respiratory distress.  I do worry that she might have a PE as well given her history of PE with no anticoagulation and the acute worsening of her shortness of breath tonight and so got a CTA of the chest.   I think her altered mental status is a result of her infection rather than a stroke.  She has no focal deficits and instead is globally weak and confused.  Prior to her CTA resulting, her troponin came back and it is elevated in the 100s.  This may be demand ischemia in the setting of respiratory infection, NSTEMI, or as a result of her PE.  I started her on heparin.  -- I reassessed her and she is feeling subjectively better on the BiPAP and her mentation appears better  reportedly by daughters who are at bedside still.  Tachycardia is better with the diltiazem and fluids.  She will require admission after her CT scans are read.  -- PE scan neg, CT head neg.  Admit sepsis/suspected resp infection.      FINAL CLINICAL IMPRESSION(S) / ED DIAGNOSES   Final diagnoses:  Respiratory infection  Sepsis, due to unspecified organism, unspecified whether acute organ dysfunction present [facility])  NSTEMI (non-ST elevated myocardial infarction) (HCC)     Rx / DC Orders   ED Discharge Orders     None        Note:  This document was prepared using Dragon voice recognition software and may include unintentional dictation errors.    Pilar Jarvis, MD 04/25/23 0102    Pilar Jarvis, MD 04/25/23 [phone]

## 2023-04-24 NOTE — ED Triage Notes (Signed)
 Pt presents to the ED via [facility] from home where she lives alone. Pt was doing her home albuterol tx for pna and had to be helped to the floor. Pt states that she felt dizzy and like she couldn't catch her breath. Pt has had a lot of emotional stress due to her husband passing 2 weeks ago.   BP 170/80 HR 122 - afib Temp with EMS 99.5 - tylenol at 2030 tonight

## 2023-04-24 NOTE — ED Provider Notes (Incomplete)
 [facility] Provider Note    Event Date/Time   First MD Initiated Contact with Patient 04/24/23 2303     (approximate)   History   Shortness of Breath and Near Syncope   HPI  Courtney Wilcox is a 77 y.o. female   Past medical history of ***    Independent Historian contributed to assessment above: ***  External Medical Documents Reviewed: ***      Physical Exam   Triage Vital Signs: ED Triage Vitals  Encounter Vitals Group     BP 04/24/23 2305 (!) 145/68     Systolic BP Percentile --      Diastolic BP Percentile --      Pulse Rate 04/24/23 2259 100     Resp 04/24/23 2259 20     Temp 04/24/23 2259 99 F (37.2 C)     Temp Source 04/24/23 2259 Oral     SpO2 04/24/23 2305 100 %     Weight 04/24/23 2304 193 lb 12.6 oz (87.9 kg)     Height 04/24/23 2304 5\' 4"  (1.626 m)     Head Circumference --      Peak Flow --      Pain Score 04/24/23 2300 10     Pain Loc --      Pain Education --      Exclude from Growth Chart --     Most recent vital signs: Vitals:   04/24/23 2259 04/24/23 2305  BP:  (!) 145/68  Pulse: 100   Resp: 20   Temp: 99 F (37.2 C)   SpO2:  100%    General: Awake, no distress. *** CV:  Good peripheral perfusion. *** Resp:  Normal effort. *** Abd:  No distention. *** Other:  ***   ED Results / Procedures / Treatments   Labs (all labs ordered are listed, but only abnormal results are displayed) Labs Reviewed - No data to display   I ordered and reviewed the above labs they are notable for ***  EKG  ED ECG REPORT I, Pilar Jarvis, the attending physician, personally viewed and interpreted this ECG.   Date: 04/24/2023  EKG Time: ***  Rate: ***  Rhythm: {ekg findings:315101}  Axis: ***  Intervals:{conduction defects:17367}  ST&T Change: ***    RADIOLOGY I independently reviewed and interpreted *** I also reviewed radiologist's formal read.   PROCEDURES:  Critical Care performed:  {CriticalCareYesNo:19197::"Yes, see critical care procedure note(s)","No"}  Procedures   MEDICATIONS ORDERED IN ED: Medications - No data to display  External physician / consultants:  I spoke with *** regarding care plan for this patient.   IMPRESSION / MDM / ASSESSMENT AND PLAN / ED COURSE  I reviewed the triage vital signs and the nursing notes.                                Patient's presentation is most consistent with {EM COPA:27473}  Differential diagnosis includes, but is not limited to, ***   ***The patient is on the cardiac monitor to evaluate for evidence of arrhythmia and/or significant heart rate changes.  MDM:  ***  I considered hospitalization for admission or observation ***        FINAL CLINICAL IMPRESSION(S) / ED DIAGNOSES   Final diagnoses:  None     Rx / DC Orders   ED Discharge Orders     None  Note:  This document was prepared using Dragon voice recognition software and may include unintentional dictation errors.

## 2023-04-24 NOTE — ED Notes (Addendum)
 Pt talking through clenched teeth, daughters just arrived at bedside told this RN that this is not pts normal. Modesto Charon, MD notified

## 2023-04-25 ENCOUNTER — Inpatient Hospital Stay: Payer: Medicare Other

## 2023-04-25 ENCOUNTER — Other Ambulatory Visit: Payer: Self-pay

## 2023-04-25 ENCOUNTER — Emergency Department

## 2023-04-25 ENCOUNTER — Encounter: Payer: Self-pay | Admitting: Family Medicine

## 2023-04-25 ENCOUNTER — Ambulatory Visit: Admitting: Cardiology

## 2023-04-25 DIAGNOSIS — G4733 Obstructive sleep apnea (adult) (pediatric): Secondary | ICD-10-CM

## 2023-04-25 DIAGNOSIS — J988 Other specified respiratory disorders: Principal | ICD-10-CM

## 2023-04-25 DIAGNOSIS — I472 Ventricular tachycardia, unspecified: Secondary | ICD-10-CM | POA: Diagnosis present

## 2023-04-25 DIAGNOSIS — R7989 Other specified abnormal findings of blood chemistry: Secondary | ICD-10-CM | POA: Diagnosis not present

## 2023-04-25 DIAGNOSIS — M069 Rheumatoid arthritis, unspecified: Secondary | ICD-10-CM

## 2023-04-25 DIAGNOSIS — J9601 Acute respiratory failure with hypoxia: Secondary | ICD-10-CM | POA: Diagnosis present

## 2023-04-25 DIAGNOSIS — J45901 Unspecified asthma with (acute) exacerbation: Secondary | ICD-10-CM | POA: Diagnosis present

## 2023-04-25 DIAGNOSIS — I471 Supraventricular tachycardia, unspecified: Secondary | ICD-10-CM | POA: Diagnosis not present

## 2023-04-25 DIAGNOSIS — E872 Acidosis, unspecified: Secondary | ICD-10-CM | POA: Diagnosis present

## 2023-04-25 DIAGNOSIS — I083 Combined rheumatic disorders of mitral, aortic and tricuspid valves: Secondary | ICD-10-CM | POA: Diagnosis present

## 2023-04-25 DIAGNOSIS — I4892 Unspecified atrial flutter: Secondary | ICD-10-CM | POA: Diagnosis present

## 2023-04-25 DIAGNOSIS — F32A Depression, unspecified: Secondary | ICD-10-CM | POA: Diagnosis present

## 2023-04-25 DIAGNOSIS — J8283 Eosinophilic asthma: Secondary | ICD-10-CM

## 2023-04-25 DIAGNOSIS — I2489 Other forms of acute ischemic heart disease: Secondary | ICD-10-CM | POA: Diagnosis present

## 2023-04-25 DIAGNOSIS — A419 Sepsis, unspecified organism: Secondary | ICD-10-CM

## 2023-04-25 DIAGNOSIS — I214 Non-ST elevation (NSTEMI) myocardial infarction: Secondary | ICD-10-CM | POA: Diagnosis not present

## 2023-04-25 DIAGNOSIS — E1122 Type 2 diabetes mellitus with diabetic chronic kidney disease: Secondary | ICD-10-CM | POA: Diagnosis present

## 2023-04-25 DIAGNOSIS — I2510 Atherosclerotic heart disease of native coronary artery without angina pectoris: Secondary | ICD-10-CM

## 2023-04-25 DIAGNOSIS — I129 Hypertensive chronic kidney disease with stage 1 through stage 4 chronic kidney disease, or unspecified chronic kidney disease: Secondary | ICD-10-CM | POA: Diagnosis present

## 2023-04-25 DIAGNOSIS — J9621 Acute and chronic respiratory failure with hypoxia: Principal | ICD-10-CM

## 2023-04-25 DIAGNOSIS — E669 Obesity, unspecified: Secondary | ICD-10-CM | POA: Diagnosis present

## 2023-04-25 DIAGNOSIS — I4891 Unspecified atrial fibrillation: Secondary | ICD-10-CM | POA: Diagnosis present

## 2023-04-25 DIAGNOSIS — J42 Unspecified chronic bronchitis: Secondary | ICD-10-CM | POA: Diagnosis present

## 2023-04-25 DIAGNOSIS — I4719 Other supraventricular tachycardia: Secondary | ICD-10-CM | POA: Diagnosis present

## 2023-04-25 DIAGNOSIS — R55 Syncope and collapse: Secondary | ICD-10-CM | POA: Diagnosis present

## 2023-04-25 DIAGNOSIS — U099 Post covid-19 condition, unspecified: Secondary | ICD-10-CM | POA: Diagnosis present

## 2023-04-25 DIAGNOSIS — J9811 Atelectasis: Secondary | ICD-10-CM | POA: Diagnosis present

## 2023-04-25 DIAGNOSIS — R6510 Systemic inflammatory response syndrome (SIRS) of non-infectious origin without acute organ dysfunction: Secondary | ICD-10-CM

## 2023-04-25 DIAGNOSIS — E785 Hyperlipidemia, unspecified: Secondary | ICD-10-CM | POA: Diagnosis present

## 2023-04-25 DIAGNOSIS — N1832 Chronic kidney disease, stage 3b: Secondary | ICD-10-CM | POA: Diagnosis present

## 2023-04-25 DIAGNOSIS — D539 Nutritional anemia, unspecified: Secondary | ICD-10-CM | POA: Diagnosis present

## 2023-04-25 DIAGNOSIS — D696 Thrombocytopenia, unspecified: Secondary | ICD-10-CM | POA: Diagnosis present

## 2023-04-25 LAB — RESPIRATORY PANEL BY PCR

## 2023-04-25 LAB — BLOOD GAS, VENOUS
Acid-base deficit: 4 mmol/L — ABNORMAL HIGH (ref 0.0–2.0)
Bicarbonate: 20.9 mmol/L (ref 20.0–28.0)
Delivery systems: POSITIVE
O2 Saturation: 83.4 %
Patient temperature: 37
pCO2, Ven: 37 mmHg — ABNORMAL LOW (ref 44–60)
pH, Ven: 7.36 (ref 7.25–7.43)
pO2, Ven: 48 mmHg — ABNORMAL HIGH (ref 32–45)

## 2023-04-25 LAB — CBG MONITORING, ED: Glucose-Capillary: 139 mg/dL — ABNORMAL HIGH (ref 70–99)

## 2023-04-25 LAB — CBC
HCT: 36.2 % (ref 36.0–46.0)
Hemoglobin: 11.8 g/dL — ABNORMAL LOW (ref 12.0–15.0)
MCH: 33.1 pg (ref 26.0–34.0)
MCHC: 32.6 g/dL (ref 30.0–36.0)
MCV: 101.4 fL — ABNORMAL HIGH (ref 80.0–100.0)
Platelets: 154 10*3/uL (ref 150–400)
RBC: 3.57 MIL/uL — ABNORMAL LOW (ref 3.87–5.11)
RDW: 13.9 % (ref 11.5–15.5)
WBC: 6 10*3/uL (ref 4.0–10.5)
nRBC: 0 % (ref 0.0–0.2)

## 2023-04-25 LAB — HEPATIC FUNCTION PANEL
ALT: 33 U/L (ref 0–44)
AST: 57 U/L — ABNORMAL HIGH (ref 15–41)
Albumin: 3.2 g/dL — ABNORMAL LOW (ref 3.5–5.0)
Alkaline Phosphatase: 141 U/L — ABNORMAL HIGH (ref 38–126)
Bilirubin, Direct: 0.2 mg/dL (ref 0.0–0.2)
Indirect Bilirubin: 0.3 mg/dL (ref 0.3–0.9)
Total Bilirubin: 0.5 mg/dL (ref 0.0–1.2)
Total Protein: 6.5 g/dL (ref 6.5–8.1)

## 2023-04-25 LAB — BASIC METABOLIC PANEL WITH GFR
Anion gap: 10 (ref 5–15)
BUN: 30 mg/dL — ABNORMAL HIGH (ref 8–23)
CO2: 20 mmol/L — ABNORMAL LOW (ref 22–32)
Calcium: 8.9 mg/dL (ref 8.9–10.3)
Chloride: 108 mmol/L (ref 98–111)
Creatinine, Ser: 1.72 mg/dL — ABNORMAL HIGH (ref 0.44–1.00)
GFR, Estimated: 31 mL/min — ABNORMAL LOW (ref >60)
Glucose, Bld: 157 mg/dL — ABNORMAL HIGH (ref 70–99)
Potassium: 3.6 mmol/L (ref 3.5–5.1)
Sodium: 138 mmol/L (ref 135–145)

## 2023-04-25 LAB — URINALYSIS, ROUTINE W REFLEX MICROSCOPIC
Bilirubin Urine: NEGATIVE
Glucose, UA: NEGATIVE mg/dL
Ketones, ur: NEGATIVE mg/dL
Nitrite: NEGATIVE
Protein, ur: 30 mg/dL — AB
Specific Gravity, Urine: 1.043 — ABNORMAL HIGH (ref 1.005–1.030)
pH: 5 (ref 5.0–8.0)

## 2023-04-25 LAB — RESP PANEL BY RT-PCR (RSV, FLU A&B, COVID)  RVPGX2
Influenza A by PCR: NEGATIVE
Influenza B by PCR: NEGATIVE
Resp Syncytial Virus by PCR: NEGATIVE
SARS Coronavirus 2 by RT PCR: NEGATIVE

## 2023-04-25 LAB — PROCALCITONIN: Procalcitonin: 75.13 ng/mL

## 2023-04-25 LAB — HEPARIN LEVEL (UNFRACTIONATED)
Heparin Unfractionated: 0.27 [IU]/mL — ABNORMAL LOW (ref 0.30–0.70)
Heparin Unfractionated: 0.2 [IU]/mL — ABNORMAL LOW (ref 0.30–0.70)

## 2023-04-25 LAB — TROPONIN I (HIGH SENSITIVITY)
Troponin I (High Sensitivity): 186 ng/L (ref <18)
Troponin I (High Sensitivity): 317 ng/L (ref <18)
Troponin I (High Sensitivity): 351 ng/L (ref <18)
Troponin I (High Sensitivity): 294 ng/L (ref <18)

## 2023-04-25 LAB — LACTIC ACID, PLASMA
Lactic Acid, Venous: 1.4 mmol/L (ref 0.5–1.9)
Lactic Acid, Venous: 1.5 mmol/L (ref 0.5–1.9)

## 2023-04-25 MED ORDER — HEPARIN BOLUS VIA INFUSION
1100.0000 [IU] | Freq: Once | INTRAVENOUS | Status: AC
  Administered 2023-04-25: 1100 [IU] via INTRAVENOUS
  Filled 2023-04-25: qty 1100

## 2023-04-25 MED ORDER — HEPARIN BOLUS VIA INFUSION
2200.0000 [IU] | Freq: Once | INTRAVENOUS | Status: AC
  Administered 2023-04-25: 2200 [IU] via INTRAVENOUS
  Filled 2023-04-25: qty 2200

## 2023-04-25 MED ORDER — HEPARIN BOLUS VIA INFUSION
4000.0000 [IU] | Freq: Once | INTRAVENOUS | Status: AC
  Administered 2023-04-25: 4000 [IU] via INTRAVENOUS
  Filled 2023-04-25: qty 4000

## 2023-04-25 MED ORDER — ONDANSETRON HCL 4 MG PO TABS: 4.0000 mg | ORAL_TABLET | Freq: Four times a day (QID) | ORAL | Status: DC | PRN

## 2023-04-25 MED ORDER — DILTIAZEM HCL ER COATED BEADS 240 MG PO CP24
240.0000 mg | ORAL_CAPSULE | Freq: Every day | ORAL | Status: DC
  Administered 2023-04-26 – 2023-04-27 (×2): 240 mg via ORAL
  Filled 2023-04-25 (×2): qty 1

## 2023-04-25 MED ORDER — SODIUM CHLORIDE 0.9 % IV BOLUS
500.0000 mL | Freq: Once | INTRAVENOUS | Status: AC
  Administered 2023-04-25: 500 mL via INTRAVENOUS

## 2023-04-25 MED ORDER — PREDNISONE 20 MG PO TABS
40.0000 mg | ORAL_TABLET | Freq: Every day | ORAL | Status: DC
  Administered 2023-04-26 – 2023-04-27 (×2): 40 mg via ORAL
  Filled 2023-04-25 (×2): qty 2

## 2023-04-25 MED ORDER — ACETAMINOPHEN 325 MG PO TABS
650.0000 mg | ORAL_TABLET | Freq: Once | ORAL | Status: AC
  Administered 2023-04-25: 650 mg via ORAL
  Filled 2023-04-25: qty 2

## 2023-04-25 MED ORDER — HEPARIN (PORCINE) 25000 UT/250ML-% IV SOLN
1250.0000 [IU]/h | INTRAVENOUS | Status: DC
  Administered 2023-04-25 (×2): 900 [IU]/h via INTRAVENOUS
  Administered 2023-04-26: 1250 [IU]/h via INTRAVENOUS
  Filled 2023-04-25 (×3): qty 250

## 2023-04-25 MED ORDER — ASPIRIN 81 MG PO CHEW
324.0000 mg | CHEWABLE_TABLET | Freq: Once | ORAL | Status: AC
  Administered 2023-04-25: 324 mg via ORAL
  Filled 2023-04-25: qty 4

## 2023-04-25 MED ORDER — IOHEXOL 350 MG/ML SOLN
75.0000 mL | Freq: Once | INTRAVENOUS | Status: AC | PRN
  Administered 2023-04-25: 75 mL via INTRAVENOUS

## 2023-04-25 MED ORDER — ONDANSETRON HCL 4 MG/2ML IJ SOLN
4.0000 mg | Freq: Four times a day (QID) | INTRAMUSCULAR | Status: DC | PRN
Start: 2023-04-25 — End: 2023-04-27

## 2023-04-25 MED ORDER — METHYLPREDNISOLONE SODIUM SUCC 40 MG IJ SOLR
40.0000 mg | Freq: Two times a day (BID) | INTRAMUSCULAR | Status: AC
  Administered 2023-04-25 – 2023-04-26 (×2): 40 mg via INTRAVENOUS
  Filled 2023-04-25 (×2): qty 1

## 2023-04-25 MED ORDER — SODIUM CHLORIDE 0.9 % IV SOLN
500.0000 mg | Freq: Once | INTRAVENOUS | Status: AC
  Administered 2023-04-25: 500 mg via INTRAVENOUS
  Filled 2023-04-25: qty 5

## 2023-04-25 MED ORDER — SODIUM CHLORIDE 0.9 % IV BOLUS (SEPSIS)
1000.0000 mL | Freq: Once | INTRAVENOUS | Status: AC
  Administered 2023-04-25: 1000 mL via INTRAVENOUS

## 2023-04-25 MED ORDER — LEVALBUTEROL HCL 0.63 MG/3ML IN NEBU
0.6300 mg | INHALATION_SOLUTION | Freq: Four times a day (QID) | RESPIRATORY_TRACT | Status: DC
  Administered 2023-04-25 – 2023-04-26 (×2): 0.63 mg via RESPIRATORY_TRACT
  Filled 2023-04-25 (×3): qty 3

## 2023-04-25 MED ORDER — LEVALBUTEROL HCL 0.63 MG/3ML IN NEBU
0.6300 mg | INHALATION_SOLUTION | Freq: Four times a day (QID) | RESPIRATORY_TRACT | Status: DC | PRN
  Administered 2023-04-25: 0.63 mg via RESPIRATORY_TRACT
  Filled 2023-04-25: qty 3

## 2023-04-25 MED ORDER — ACETAMINOPHEN 325 MG PO TABS
650.0000 mg | ORAL_TABLET | Freq: Once | ORAL | Status: AC
Start: 2023-04-25 — End: 2023-04-25
  Administered 2023-04-25: 650 mg via ORAL
  Filled 2023-04-25: qty 2

## 2023-04-25 MED ORDER — SODIUM CHLORIDE 0.9 % IV SOLN
1.0000 g | Freq: Once | INTRAVENOUS | Status: AC
  Administered 2023-04-25: 1 g via INTRAVENOUS
  Filled 2023-04-25: qty 10

## 2023-04-25 MED ORDER — SODIUM CHLORIDE 0.9 % IV SOLN: INTRAVENOUS | Status: AC

## 2023-04-25 MED ORDER — LORAZEPAM 2 MG/ML IJ SOLN
0.5000 mg | Freq: Four times a day (QID) | INTRAMUSCULAR | Status: DC | PRN
  Administered 2023-04-25 – 2023-04-27 (×3): 0.5 mg via INTRAVENOUS
  Filled 2023-04-25 (×3): qty 1

## 2023-04-25 MED ORDER — DILTIAZEM HCL 25 MG/5ML IV SOLN
10.0000 mg | Freq: Once | INTRAVENOUS | Status: AC
  Administered 2023-04-25: 10 mg via INTRAVENOUS
  Filled 2023-04-25: qty 5

## 2023-04-25 NOTE — H&P (Signed)
 History and Physical    Patient: Courtney Wilcox ZOX:[id number] DOB: Aug 08, 1945 DOA: 04/24/2023 DOS: the patient was seen and examined on 04/25/2023 PCP: Patrice Paradise, MD  Patient coming from: Home  Chief Complaint:  Chief Complaint  Patient presents with   Shortness of Breath   Near Syncope   HPI: CANDELA KRUL is a 78 y.o. female with medical history significant of SVT, depression, HTN, OSA, RA, hx/o PE, eosinophilic asthma presented with acute respiratory failure with hypoxia, NSTEMI, SIRS.  Patient reports worsening shortness of breath over the past 3 to 4 days.  Positive increased work of breathing.  Mild wheezing.  Mild left-sided chest pain with movement and deep breathing.  No abdominal pain.  No nausea or vomiting.  No orthopnea or PND.  Was seen by her pulmonologist roughly 2 days ago and was diagnosed with a bronchitic flare.  Was placed on a course of Z-Pak as well as stress dose steroids.  Patient reports mild improvement in symptoms that acutely decompensated over the past 12 to 24 hours.  No chest pressure.  Minimal nausea.  Does not wear oxygen at home.  Has been compliant with home regimen including inhalers.  Noted to have been admitted September 2020 for for issues including unstable angina status post stent placement as well as SVT on diltiazem.  Per report, patient had follow-up with electro physiology for SVT today.  No reported palpitations.  Has been compliant with home diltiazem.  No reported tobacco or alcohol use. Presented to the ER temp 100.9, heart rate 100s, BP stable, requiring BiPAP to keep O2 sats greater than 99%.  White count 6, hemoglobin 12, platelets 154, urinalysis grossly stable, troponin 186 at 317.  VBG stable.  Creatinine 1.72.  Glucose 150s.  CT head and CTA of the head neck grossly within normal notes.  Review of Systems: As mentioned in the history of present illness. All other systems reviewed and are negative. Past Medical History:   Diagnosis Date   Allergic rhinitis    Anxiety    Arthritis    Asthma    Atrophic vaginitis    B12 deficiency    Bronchitis, chronic (HCC)    CAD (coronary artery disease)    a. 03/2003 Cath: Nl cors; b. 09/2022 Staged PCI: LM nl, LAD 85p/47m (3.5x26 Onyx Frontier DES), RI small, mild dzs, LCX nl, OM2 mild dzs, RCA 30p.   Cervical disc disease    CKD (chronic kidney disease), stage III (HCC)    Collagen vascular disease (HCC)    RA   Cough - post-COVID    Depression    Diastolic dysfunction    a. 11/2020 Echo: EF 45-50%, GrI DD; b. 10/2021 Echo: EF 60-65%, GrII DD; c. 09/2022 Echo: EF 55-60%, no rwma, GrI DD, nl RV fxn, mildly dil LA. No significant valvular dzs.   Fibrocystic breast disease    Foot ulcer (HCC)    GERD (gastroesophageal reflux disease)    Hammer toe    Hyperlipidemia    IBS (irritable bowel syndrome)    Macrocytic anemia    Macular degeneration 2023   Obesity    OSA (obstructive sleep apnea)    Osteoarthrosis    Multi site   Osteopenia    PSVT (paroxysmal supraventricular tachycardia) (HCC)    a. 04/2022 Zio: Sinus rhythm @ 57 (42-101). Rare PACs/PVCs. 81191 SVT runs lasting up to 64m 12sec - max rate 222. 5 beats NSVT. Triggered events = sinus, SVT, PACs.   Rheumatoid  arthritis (HCC)    Shingles    Thyroid nodule    MULTIPLE   Tonsillitis    recurrent   Umbilical hernia    Past Surgical History:  Procedure Laterality Date   BACK SURGERY  2011   LAMINECTOMY   BREAST BIOPSY Right    neg   CERVICAL FUSION  2011, 2012   X 2   COLONOSCOPY  2005, 2015   COLONOSCOPY WITH PROPOFOL N/A 08/04/2020   Procedure: COLONOSCOPY WITH PROPOFOL;  Surgeon: Earline Mayotte, MD;  Location: [facility] ENDOSCOPY;  Service: Endoscopy;  Laterality: N/A;   CORONARY STENT INTERVENTION N/A 10/02/2022   Procedure: CORONARY STENT INTERVENTION;  Surgeon: Iran Ouch, MD;  Location: [facility] INVASIVE CV LAB;  Service: Cardiovascular;  Laterality: N/A;   ESOPHAGOGASTRODUODENOSCOPY  (EGD) WITH PROPOFOL N/A 09/26/2021   Procedure: ESOPHAGOGASTRODUODENOSCOPY (EGD) WITH PROPOFOL;  Surgeon: Jaynie Collins, DO;  Location: [facility] ENDOSCOPY;  Service: Gastroenterology;  Laterality: N/A;   ESOPHAGOGASTRODUODENOSCOPY ENDOSCOPY  2005, 2013, 2015   EYE SURGERY Bilateral    Cataract Extraction with IOL    HERNIA REPAIR     JOINT REPLACEMENT     KNEE ARTHROPLASTY Left 07/12/2016   Procedure: COMPUTER ASSISTED TOTAL KNEE ARTHROPLASTY;  Surgeon: Donato Heinz, MD;  Location: [facility] ORS;  Service: Orthopedics;  Laterality: Left;   Laryngeal tear after intubation w/repair     LUMBAR DISC SURGERY  12/2009   REPLACEMENT TOTAL KNEE Right 06/2007   DR. HOOTEN, [facility]   REPLACEMENT UNICONDYLAR JOINT KNEE     DR. HOOTEN, [facility]   RIGHT/LEFT HEART CATH AND CORONARY ANGIOGRAPHY N/A 09/29/2022   Procedure: RIGHT/LEFT HEART CATH AND CORONARY ANGIOGRAPHY;  Surgeon: Iran Ouch, MD;  Location: [facility] INV[facility]IVE CV LAB;  Service: Cardiovascular;  Laterality: N/A;   SHOULDER ARTHROSCOPY WITH ROTATOR CUFF REPAIR Right    TONSILLECTOMY     TOTAL HIP ARTHROPLASTY Right 01/31/2017   Procedure: TOTAL HIP ARTHROPLASTY;  Surgeon: Donato Heinz, MD;  Location: [facility] ORS;  Service: Orthopedics;  Laterality: Right;   TUBAL LIGATION     UPPER GI ENDOSCOPY     Social History:  reports that she has never smoked. She has never used smokeless tobacco. She reports that she does not drink alcohol and does not use drugs.  Allergies  Allergen Reactions   Amiodarone Other (See Comments)    Fatigue, Nausea, anorexia, malaise   Lactose Other (See Comments) and Diarrhea   Other Other (See Comments)    Pt ONLY tolerates SLOW-FE (slow released iron)---upsets IBS   Biaxin [Clarithromycin] Other (See Comments)    GI upset    Hydrocodone-Acetaminophen Itching    Tolerates acetaminophen    Oxycodone Itching   Sulfa Antibiotics Other (See Comments) and Diarrhea    GI upset  GI upset GI upset    Sulfasalazine  Diarrhea    Family History  Problem Relation Age of Onset   Asthma Mother    Breast cancer Mother 40   Emphysema Father        smoked   Lung cancer Father        smoked   Coronary artery disease Father 61    Prior to Admission medications   Medication Sig Start Date End Date Taking? Authorizing Provider  acetaminophen (TYLENOL) 500 MG tablet Take 1,000 mg by mouth 3 (three) times daily as needed for mild pain.    Yes [provider]  ALPRAZolam Prudy Feeler) 0.5 MG tablet Take 1 tablet (0.5 mg total) by mouth at  bedtime as needed for sleep or anxiety. 10/03/22  Yes Jonah Blue, MD  aspirin 81 MG chewable tablet Chew 1 tablet (81 mg total) by mouth daily. 10/04/22  Yes Jonah Blue, MD  azithromycin (ZITHROMAX) 250 MG tablet Take 250 mg by mouth daily. 04/23/23 04/28/23 Yes [provider]  Cholecalciferol (VITAMIN D) 2000 units tablet Take 2,000 Units by mouth daily.   Yes [provider]  diltiazem (CARDIZEM CD) 240 MG 24 hr capsule Take 1 capsule (240 mg total) by mouth daily. 11/09/22 04/25/23 Yes Riddle, Luella Cook, NP  Dupilumab 300 MG/2ML SOPN Inject 4 mLs into the skin every 14 (fourteen) days. 12/15/21  Yes [provider]  EPINEPHrine 0.3 mg/0.3 mL IJ SOAJ injection Inject 0.3 mg into the muscle as needed. 11/29/21  Yes [provider]  famotidine (PEPCID) 20 MG tablet Take 20 mg by mouth daily as needed.   Yes [provider]  Fluticasone-Umeclidin-Vilant 100-62.5-25 MCG/ACT AEPB Inhale into the lungs. Inhale 1 Puff into the lungs once daily 12/12/22  Yes [provider]  gabapentin (NEURONTIN) 300 MG capsule Take 300 mg by mouth at bedtime.   Yes [provider]  leflunomide (ARAVA) 10 MG tablet Take 10 mg by mouth daily. 06/30/20  Yes [provider]  levalbuterol Pauline Aus) 0.63 MG/3ML nebulizer solution Take 0.63 mg by nebulization every 6 (six) hours as needed for wheezing. 04/25/21  Yes [provider]  meclizine (ANTIVERT) 12.5 MG tablet PLEASE SEE ATTACHED FOR DETAILED DIRECTIONS 10/05/18  Yes [provider]  montelukast (SINGULAIR) 10 MG tablet Take 10 mg by mouth daily as needed (for allergies.).    Yes [provider]  nystatin (MYCOSTATIN) 100000 UNIT/ML suspension Take 5 mLs by mouth as needed. 06/21/21  Yes [provider]  pantoprazole (PROTONIX) 40 MG tablet Take 1 tablet (40 mg total) by mouth 2 (two) times daily. Patient taking differently: Take 40 mg by mouth daily. 10/03/22  Yes Jonah Blue, MD  predniSONE (DELTASONE) 2.5 MG tablet Take 1 tablet (2.5 mg total) by mouth daily with breakfast. 10/04/22  Yes Jonah Blue, MD  sertraline (ZOLOFT) 25 MG tablet Take 50 mg by mouth daily. 50 mg total 10/16/22 10/16/23 Yes [provider]  Simethicone 180 MG CAPS Take 1-2 capsules by mouth as needed.   Yes [provider]  triamcinolone (NASACORT) 55 MCG/ACT AERO nasal inhaler Place 2 sprays into the nose daily.   Yes [provider]  ZENPEP 25000-79000 units CPEP Take by mouth. Occasionally takes with a large meal 01/20/22  Yes [provider]  atorvastatin (LIPITOR) 80 MG tablet Take 1 tablet (80 mg total) by mouth daily. Patient not taking: Reported on 04/25/2023 11/28/22   Creig Hines, NP  clopidogrel (PLAVIX) 75 MG tablet TAKE 1 TABLET BY MOUTH DAILY WITH BREAKFAST. 02/26/23   Creig Hines, NP  Cyanocobalamin 1000 MCG/ML KIT Inject as directed every 30 (thirty) days.    [provider]  hydroxychloroquine (PLAQUENIL) 200 MG tablet Take 1 tablet (200 mg total) by mouth daily. Patient not taking: Reported on 04/25/2023 07/21/16   Tera Partridge, [place]    Physical Exam: Vitals:   04/25/23 0814 04/25/23 0843 04/25/23 0858 04/25/23 0930  BP:  (!) 107/59  111/62  Pulse:  (!) 56  71  Resp:  (!) 24  19  Temp:   98.4 F (36.9 C)   TempSrc:   Oral   SpO2: 99% 100%  100%  Weight:  Height:       Physical Exam Constitutional:      Appearance: She is obese.     Comments: Minimal to mild lethargy    HENT:     Head: Normocephalic and atraumatic.     Mouth/Throat:     Mouth: Mucous membranes are moist.  Eyes:     Pupils: Pupils are equal, round, and reactive to light.  Cardiovascular:     Rate and Rhythm: Normal rate and regular rhythm.  Pulmonary:     Effort: Pulmonary effort is normal.     Breath sounds: Wheezing present.  Abdominal:     General: Bowel sounds are normal.  Musculoskeletal:        General: Normal range of motion.  Skin:    General: Skin is warm.  Neurological:     General: No focal deficit present.  Psychiatric:        Mood and Affect: Mood normal.     Data Reviewed:  There are no new results to review at this time.  CT Angio Chest PE W/Cm &/Or Wo Cm CLINICAL DATA:  High probability pulmonary embolism, sink  EXAM: CT ANGIOGRAPHY CHEST WITH CONTRAST  TECHNIQUE: Multidetector CT imaging of the chest was performed using the standard protocol during bolus administration of intravenous contrast. Multiplanar CT image reconstructions and MIPs were obtained to evaluate the vascular anatomy.  RADIATION DOSE REDUCTION: This exam was performed according to the departmental dose-optimization program which includes automated exposure control, adjustment of the mA and/or kV according to patient size and/or use of iterative reconstruction technique.  CONTRAST:  75mL OMNIPAQUE IOHEXOL 350 MG/ML SOLN  COMPARISON:  None Available.  FINDINGS: Cardiovascular: Left anterior descending coronary artery stenting has been performed. Global cardiac size within normal limits. No pericardial effusion. Central pulmonary arteries are of normal caliber. No intraluminal filling defect identified to suggest acute pulmonary embolism. Mild atherosclerotic calcification within thoracic aorta. No aortic aneurysm.  Mediastinum/Nodes: No enlarged  mediastinal, hilar, or axillary lymph nodes. Thyroid gland, trachea, and esophagus demonstrate no significant findings.  Lungs/Pleura: Lungs are clear. No pleural effusion or pneumothorax.  Upper Abdomen: No acute abnormality.  Musculoskeletal: Osseous structures are age-appropriate. No acute bone abnormality.  Review of the MIP images confirms the above findings.  IMPRESSION: 1. No evidence of acute pulmonary embolism. No acute cardiopulmonary process. 2.  Aortic Atherosclerosis (ICD10-I70.0).  Electronically Signed   By: Helyn Numbers M.D.   On: 04/25/2023 04:22 CT Head Wo Contrast CLINICAL DATA:  Mental status change, unknown cause  EXAM: CT HEAD WITHOUT CONTRAST  TECHNIQUE: Contiguous axial images were obtained from the base of the skull through the vertex without intravenous contrast.  RADIATION DOSE REDUCTION: This exam was performed according to the departmental dose-optimization program which includes automated exposure control, adjustment of the mA and/or kV according to patient size and/or use of iterative reconstruction technique.  COMPARISON:  CT head 04/04/2020  FINDINGS: Brain:  Patchy and confluent areas of decreased attenuation are noted throughout the deep and periventricular white matter of the cerebral hemispheres bilaterally, compatible with chronic microvascular ischemic disease. No evidence of large-territorial acute infarction. No parenchymal hemorrhage. No mass lesion. No extra-axial collection.  No mass effect or midline shift. No hydrocephalus. Basilar cisterns are patent.  Vascular: No hyperdense vessel. Atherosclerotic calcifications are present within the cavernous internal carotid and vertebral arteries.  Skull: No acute fracture or focal lesion.  Sinuses/Orbits: Right maxillary sinus mucosal thickening. Paranasal sinuses and mastoid air cells are clear. Bilateral lens replacement.  Otherwise the orbits are  unremarkable.  Other: None.  IMPRESSION: No acute intracranial abnormality.  Electronically Signed   By: Tish Frederickson M.D.   On: 04/25/2023 03:07 DG Chest Port 1 View CLINICAL DATA:  sob recent pna  EXAM: PORTABLE CHEST 1 VIEW  COMPARISON:  Chest x-ray 09/27/2022, CT chest 09/27/2022  FINDINGS: The heart and mediastinal contours are unchanged. Atherosclerotic plaque.  No focal consolidation. No pulmonary edema. Question trace left pleural effusion. No pneumothorax.  No acute osseous abnormality.  Cervical spine surgical hardware.  IMPRESSION: 1. Question trace left pleural effusion. Recommend repeat chest x-ray PA and lateral view of the chest for further evaluation. 2.  Aortic Atherosclerosis (ICD10-I70.0).  Electronically Signed   By: Tish Frederickson M.D.   On: 04/25/2023 03:05  Lab Results  Component Value Date   WBC 6.0 04/24/2023   HGB 11.8 (L) 04/24/2023   HCT 36.2 04/24/2023   MCV 101.4 (H) 04/24/2023   PLT 154 04/24/2023   Last metabolic panel Lab Results  Component Value Date   GLUCOSE 157 (H) 04/24/2023   NA 138 04/24/2023   K 3.6 04/24/2023   CL 108 04/24/2023   CO2 20 (L) 04/24/2023   BUN 30 (H) 04/24/2023   CREATININE 1.72 (H) 04/24/2023   GFRNONAA 31 (L) 04/24/2023   CALCIUM 8.9 04/24/2023   PROT 6.5 04/24/2023   ALBUMIN 3.2 (L) 04/24/2023   LABGLOB 2.0 10/11/2022   AGRATIO 1.4 07/15/2020   BILITOT 0.5 04/24/2023   ALKPHOS 141 (H) 04/24/2023   AST 57 (H) 04/24/2023   ALT 33 04/24/2023   ANIONGAP 10 04/24/2023    Assessment and Plan: * Acute respiratory failure with hypoxia (HCC) Decompensated respiratory status requiring BiPAP in the setting of baseline eosinophilic asthma with concern for bronchitis-failed outpatient regimen CTA of the chest negative for PE and otherwise grossly stable No overt signs of volume overload  Dr. Karna Christmas with pulmonology consulted Will add on resp panel  Some concern for possible environmental  allergic nidus of symptoms  IV Solu-Medrol and Xopenex Noted secondary NSTEMI is a confounding issue  Follow up pulmonology recommendations      NSTEMI (non-ST elevated myocardial infarction) (HCC) Troponin 180s to 310s in setting of decompensated respiratory failure requiring BiPAP Noted baseline history of coronary disease status post stent placement September 2024 Started on heparin drip in the ER Suspect demand mismatch in the setting of acute respiratory failure Continue home regimen including aspirin and statin Follow-up cardiology recommendations  SIRS (systemic inflammatory response syndrome) (HCC) Meeting SIRS criteria with Tmax 100.9, heart rate 100s White count 6 Lactate 1.4 Noted decompensated respiratory failure requiring BiPAP in the setting of eosinophilic asthma CTA negative for any overt infection COVID flu and RSV negative Will panculture And respiratory virus panel Procalcitonin pending Will otherwise monitor with symptomatic management for now Follow closely    Rheumatoid arthritis (HCC) Continue leflunomide, Plaquenil, steroid  May need taper given recent stress dose steroid use   GERD without esophagitis PPI   Chronic kidney disease, stage 3b (HCC) Cr 1.7 w/ GFR in the 30s  Near baseline  Monitor    History of pulmonary embolism CTA today negative for PE  On heparin gtt in setting of NSTEMI    SVT (supraventricular tachycardia) (HCC) Baseline hx/o SVT and atrial flutter followed by EP  HR currently in 70s  Cont diltiazem  Follow up cardiology recommendations     Greater than 50% was spent in counseling and coordination of care with patient Critical  care time: 60 minutes   Advance Care Planning:   Code Status: Full Code   Consults: Cardiology, pulmonology   Family Communication: Daughter at the bedside   Severity of Illness: The appropriate patient status for this patient is OBSERVATION. Observation status is judged to be  reasonable and necessary in order to provide the required intensity of service to ensure the patient's safety. The patient's presenting symptoms, physical exam findings, and initial radiographic and laboratory data in the context of their medical condition is felt to place them at decreased risk for further clinical deterioration. Furthermore, it is anticipated that the patient will be medically stable for discharge from the hospital within 2 midnights of admission.   Author: Floydene Flock, MD 04/25/2023 10:09 AM  For on call review [email or web address].

## 2023-04-25 NOTE — Assessment & Plan Note (Signed)
 Troponin 180s to 310s in setting of decompensated respiratory failure requiring BiPAP Noted baseline history of coronary disease status post stent placement September 2024 Started on heparin drip in the ER Suspect demand mismatch in the setting of acute respiratory failure NSTEMI ruled out-cardiology is on board - Continue heparin infusion for 48 hours-no further cardiac workup needed -Continue home regimen including aspirin and statin

## 2023-04-25 NOTE — Progress Notes (Signed)
 PHARMACY - ANTICOAGULATION CONSULT NOTE  Pharmacy Consult for Heparin  Indication: chest pain/ACS  Allergies  Allergen Reactions   Amiodarone Other (See Comments)    Fatigue, Nausea, anorexia, malaise   Lactose Other (See Comments) and Diarrhea   Other Other (See Comments)    Pt ONLY tolerates SLOW-FE (slow released iron)---upsets IBS   Biaxin [Clarithromycin] Other (See Comments)    GI upset    Hydrocodone-Acetaminophen Itching    Tolerates acetaminophen    Oxycodone Itching   Sulfa Antibiotics Other (See Comments) and Diarrhea    GI upset  GI upset GI upset    Sulfasalazine Diarrhea    Patient Measurements: Height: 5\' 4"  (162.6 cm) Weight: 87.9 kg (193 lb 12.6 oz) IBW/kg (Calculated) : 54.7 HEPARIN DW (KG): 74.2  Vital Signs: Temp: 98.4 F (36.9 C) (04/09 0858) Temp Source: Oral (04/09 0858) BP: 112/61 (04/09 1030) Pulse Rate: 70 (04/09 1030)  Labs: Recent Labs    04/24/23 2341 04/25/23 0030 04/25/23 0209 04/25/23 0956 04/25/23 1109  HGB 11.8*  --   --   --   --   HCT 36.2  --   --   --   --   PLT 154  --   --   --   --   HEPARINUNFRC  --   --   --   --  0.27*  CREATININE 1.72*  --   --   --   --   TROPONINIHS  --  186* 317* 351*  --     Estimated Creatinine Clearance: 30.3 mL/min (A) (by C-G formula based on SCr of 1.72 mg/dL (H)).   Medical History: Past Medical History:  Diagnosis Date   Allergic rhinitis    Anxiety    Arthritis    Asthma    Atrophic vaginitis    B12 deficiency    Bronchitis, chronic (HCC)    CAD (coronary artery disease)    a. 03/2003 Cath: Nl cors; b. 09/2022 Staged PCI: LM nl, LAD 85p/11m (3.5x26 Onyx Frontier DES), RI small, mild dzs, LCX nl, OM2 mild dzs, RCA 30p.   Cervical disc disease    CKD (chronic kidney disease), stage III (HCC)    Collagen vascular disease (HCC)    RA   Cough - post-COVID    Depression    Diastolic dysfunction    a. 11/2020 Echo: EF 45-50%, GrI DD; b. 10/2021 Echo: EF 60-65%, GrII DD; c.  09/2022 Echo: EF 55-60%, no rwma, GrI DD, nl RV fxn, mildly dil LA. No significant valvular dzs.   Fibrocystic breast disease    Foot ulcer (HCC)    GERD (gastroesophageal reflux disease)    Hammer toe    Hyperlipidemia    IBS (irritable bowel syndrome)    Macrocytic anemia    Macular degeneration 2023   Obesity    OSA (obstructive sleep apnea)    Osteoarthrosis    Multi site   Osteopenia    PSVT (paroxysmal supraventricular tachycardia) (HCC)    a. 04/2022 Zio: Sinus rhythm @ 57 (42-101). Rare PACs/PVCs. 29562 SVT runs lasting up to 47m 12sec - max rate 222. 5 beats NSVT. Triggered events = sinus, SVT, PACs.   Rheumatoid arthritis (HCC)    Shingles    Thyroid nodule    MULTIPLE   Tonsillitis    recurrent   Umbilical hernia     Medications:  (Not in a hospital admission)   Assessment: Pharmacy consulted to heparin in this 78 year old female admitted with  ACS/NSTEMI. No prior anticoag noted. CrCl = 30.3 ml/min   Date/Time HL Rate  Comment 4/9 1109 0.27 900 units/hr Subtherapeutic   Goal of Therapy:  Heparin level 0.3-0.7 units/ml Monitor platelets by anticoagulation protocol: Yes   Plan:  - Heparin level subtherapeutic at 0.27 - Give 1100 unit bolus x 1 - Increase heparin infusion rate to 1050 units/hr - Check heparin level 8 hours after rate change - Monitor CBC daily while on heparin  Merryl Hacker, PharmD Clinical Pharmacist  04/25/2023,11:37 AM

## 2023-04-25 NOTE — ED Notes (Signed)
 Purwick placed

## 2023-04-25 NOTE — ED Notes (Signed)
Adjusted patient in bed for comfort.

## 2023-04-25 NOTE — ED Notes (Signed)
 RT at bedside.

## 2023-04-25 NOTE — ED Notes (Signed)
 Pt requesting her diltiazem but it's not ordered until 1000. Pt states she usually takes around 11 at night. Pharmacy called to make aware to switch dosing times

## 2023-04-25 NOTE — Consult Note (Signed)
 Cardiology Consultation   Patient ID: Courtney HUEBERT MRN: [id number]; DOB: 1945-10-28  Admit date: 04/24/2023 Date of Consult: 04/25/2023  PCP:  Patrice Paradise, MD   [facility] Providers Cardiologist:  Yvonne Kendall, MD  Electrophysiologist:  Lanier Prude, MD       Patient Profile:   Courtney Wilcox is a 78 y.o. female with a hx of CAD status post PCI to the ostial/proximal LAD (09/2022), supraventricular tachycardia, pulmonary embolism (11/2020), rheumatoid arthritis, and obstructive sleep apnea who is being seen 04/25/2023 for the evaluation of elevated troponin at the request of Dr. Alvester Morin.  History of Present Illness:   Courtney Wilcox was previously by [facility] cardiac G.  She has since established with [facility] where she follows with general cardiology NP EP for management of increasing palpitation burden with known PSVT.  Echo 10/2021 showed EF 60 to 65%, no RWMA, mild LVH, G2 DD, normal RV systolic function and size, severely dilated left atrium, and mild MR.  Underwent Zio patch in 04/2022 that showed 25,709 episodes of SVT lasting up to 40 minutes and 12 seconds with max rate of 222 bpm.  Previously on beta-blocker though this was transitioned to Cardizem with subsequent titration without significant improvement in symptom burden.  Management of SVT has been limited by underlying pulmonary disease and medication interactions with Celexa.  Seen in our office 09/2022 with increased dyspnea and associated chest tightness radiating to the left shoulder with progressive orthopnea.  She also noted associated palpitations.  It was recommended that she be transferred to the emergency room for further evaluation and likely admission with recommendation to proceed with [facility].  This was done 09/29/2022 and showed severe one-vessel CAD involving the ostial/proximal LAD with successful PCI and DES placement.  Normal LVEDP, right heart cath could not be performed due to  difficulty with venous access.  During this admission she was also started on amiodarone load for frequent PSVT.  Overall clinically improved and euvolemic on discharge.  Seen in follow-up 10/2022 and reported not tolerating amiodarone in the setting of malaise, nausea, and anorexia. Amio discontinued.  She was continued on diltiazem and recommended follow-up with EP.  Seen by EP 10/2022 with significant improvement in nausea/anorexia after coming off amiodarone.  She continued to have SVT symptoms.  BP readings were robust, and diltiazem was increased to 240 mg daily.  Follow up with general cardiology 11/2022 and reported more frequent isolated palpitations and elevations in heart rates into the 90s.  She was generally asymptomatic and reassurance was provided. Seen again by EP 12/2022 with ongoing symptoms.  Ventricular rates were well-controlled between 80s and 100s bpm. Query whether uncontrolled anxiety was contributing to patient's overall symptoms. She was most recently seen by general cardiology 03/12/2023 and was going well from a cardiac perspective. Palpitations and HRs were improved, although EKG showed an atrial run. No further testing or medication changes were indicated at that time. She was continued on ASA and Plavix, atorvastatin, and diltiazem.   Patient's daughter is at the bedside and assists with history.  She tells me patient has been sick with cough and congestion for the past few weeks, they were attributing this to allergies.  She was seen by her pulmonologist on 4/7 and reported chest discomfort with productive cough and wheezing.  She was given a steroid injection, dexamethasone, codeine cough syrup, and azithromycin.  Patient and daughter both note that the addition of these medications did not seem to  help.  The patient also developed a fever 100.4 F and subsequently took Tylenol. Yesterday 4/8, the patient developed extreme difficulty breathing and respiratory distress.  EMS was  called and placed patient on BiPAP.  Upon arrival to the ED, BP 145/68, HR 100, RR 20, T90 9 F, SpO2 100% on BiPAP.  CMP with kidney function mildly elevated from baseline.  CBC without leukocytosis and overall unremarkable.  Respiratory panel negative.  Initial ED EKG concerning for atrial fibrillation versus atrial tachycardia.  Troponin mildly elevated at 186>317>351>294.  UA without abnormality.  VBG stable.  CT head and neck without acute abnormality.  Past Medical History:  Diagnosis Date   Allergic rhinitis    Anxiety    Arthritis    Asthma    Atrophic vaginitis    B12 deficiency    Bronchitis, chronic (HCC)    CAD (coronary artery disease)    a. 03/2003 Cath: Nl cors; b. 09/2022 Staged PCI: LM nl, LAD 85p/42m (3.5x26 Onyx Frontier DES), RI small, mild dzs, LCX nl, OM2 mild dzs, RCA 30p.   Cervical disc disease    CKD (chronic kidney disease), stage III (HCC)    Collagen vascular disease (HCC)    RA   Cough - post-COVID    Depression    Diastolic dysfunction    a. 11/2020 Echo: EF 45-50%, GrI DD; b. 10/2021 Echo: EF 60-65%, GrII DD; c. 09/2022 Echo: EF 55-60%, no rwma, GrI DD, nl RV fxn, mildly dil LA. No significant valvular dzs.   Fibrocystic breast disease    Foot ulcer (HCC)    GERD (gastroesophageal reflux disease)    Hammer toe    Hyperlipidemia    IBS (irritable bowel syndrome)    Macrocytic anemia    Macular degeneration 2023   Obesity    OSA (obstructive sleep apnea)    Osteoarthrosis    Multi site   Osteopenia    PSVT (paroxysmal supraventricular tachycardia) (HCC)    a. 04/2022 Zio: Sinus rhythm @ 57 (42-101). Rare PACs/PVCs. 16109 SVT runs lasting up to 78m 12sec - max rate 222. 5 beats NSVT. Triggered events = sinus, SVT, PACs.   Rheumatoid arthritis (HCC)    Shingles    Thyroid nodule    MULTIPLE   Tonsillitis    recurrent   Umbilical hernia     Past Surgical History:  Procedure Laterality Date   BACK SURGERY  2011   LAMINECTOMY   BREAST BIOPSY Right     neg   CERVICAL FUSION  2011, 2012   X 2   COLONOSCOPY  2005, 2015   COLONOSCOPY WITH PROPOFOL N/A 08/04/2020   Procedure: COLONOSCOPY WITH PROPOFOL;  Surgeon: Earline Mayotte, MD;  Location: [facility] ENDOSCOPY;  Service: Endoscopy;  Laterality: N/A;   CORONARY STENT INTERVENTION N/A 10/02/2022   Procedure: CORONARY STENT INTERVENTION;  Surgeon: Iran Ouch, MD;  Location: [facility] INVASIVE CV LAB;  Service: Cardiovascular;  Laterality: N/A;   ESOPHAGOGASTRODUODENOSCOPY (EGD) WITH PROPOFOL N/A 09/26/2021   Procedure: ESOPHAGOGASTRODUODENOSCOPY (EGD) WITH PROPOFOL;  Surgeon: Jaynie Collins, DO;  Location: [facility] ENDOSCOPY;  Service: Gastroenterology;  Laterality: N/A;   ESOPHAGOGASTRODUODENOSCOPY ENDOSCOPY  2005, 2013, 2015   EYE SURGERY Bilateral    Cataract Extraction with IOL    HERNIA REPAIR     JOINT REPLACEMENT     KNEE ARTHROPLASTY Left 07/12/2016   Procedure: COMPUTER ASSISTED TOTAL KNEE ARTHROPLASTY;  Surgeon: Donato Heinz, MD;  Location: [facility] ORS;  Service: Orthopedics;  Laterality: Left;  Laryngeal tear after intubation w/repair     LUMBAR DISC SURGERY  12/2009   REPLACEMENT TOTAL KNEE Right 06/2007   DR. HOOTEN, [facility]   REPLACEMENT UNICONDYLAR JOINT KNEE     DR. HOOTEN, [facility]   RIGHT/LEFT HEART CATH AND CORONARY ANGIOGRAPHY N/A 09/29/2022   Procedure: RIGHT/LEFT HEART CATH AND CORONARY ANGIOGRAPHY;  Surgeon: Iran Ouch, MD;  Location: [facility] INVASIVE CV LAB;  Service: Cardiovascular;  Laterality: N/A;   SHOULDER ARTHROSCOPY WITH ROTATOR CUFF REPAIR Right    TONSILLECTOMY     TOTAL HIP ARTHROPLASTY Right 01/31/2017   Procedure: TOTAL HIP ARTHROPLASTY;  Surgeon: Donato Heinz, MD;  Location: [facility] ORS;  Service: Orthopedics;  Laterality: Right;   TUBAL LIGATION     UPPER GI ENDOSCOPY         Inpatient Medications: Scheduled Meds:  methylPREDNISolone (SOLU-MEDROL) injection  40 mg Intravenous Q12H   Followed by   Melene Muller ON 04/26/2023] predniSONE  40 mg Oral  Q breakfast   Continuous Infusions:  sodium chloride 40 mL/hr at 04/25/23 1417   heparin 1,050 Units/hr (04/25/23 1158)   PRN Meds: levalbuterol, LORazepam, ondansetron **OR** ondansetron (ZOFRAN) IV  Allergies:    Allergies  Allergen Reactions   Amiodarone Other (See Comments)    Fatigue, Nausea, anorexia, malaise   Lactose Other (See Comments) and Diarrhea   Other Other (See Comments)    Pt ONLY tolerates SLOW-FE (slow released iron)---upsets IBS   Biaxin [Clarithromycin] Other (See Comments)    GI upset    Hydrocodone-Acetaminophen Itching    Tolerates acetaminophen    Oxycodone Itching   Sulfa Antibiotics Other (See Comments) and Diarrhea    GI upset  GI upset GI upset    Sulfasalazine Diarrhea    Social History:   Social History   Socioeconomic History   Marital status: Married    Spouse name: Jillyn Hidden   Number of children: 3   Years of education: Not on file   Highest education level: Not on file  Occupational History   Not on file  Tobacco Use   Smoking status: Never   Smokeless tobacco: Never  Vaping Use   Vaping status: Never Used  Substance and Sexual Activity   Alcohol use: No   Drug use: No   Sexual activity: Not on file  Other Topics Concern   Not on file  Social History Narrative   Not on file   Social Drivers of Health   Financial Resource Strain: Low Risk  (03/14/2023)   [facility] from [facility]   Overall Financial Resource Strain (CARDIA)    Difficulty of Paying Living Expenses: Not hard at all  Food Insecurity: No Food Insecurity (04/25/2023)   Hunger Vital Sign    Worried About Running Out of Food in the Last Year: Never true    Ran Out of Food in the Last Year: Never true  Transportation Needs: No Transportation Needs (04/25/2023)   PRAPARE - Administrator, Civil Service (Medical): No    Lack of Transportation (Non-Medical): No  Physical Activity: Not on file  Stress: Not on file  Social Connections:  Moderately Isolated (04/25/2023)   Social Connection and Isolation Panel [NHANES]    Frequency of Communication with Friends and Family: More than three times a week    Frequency of Social Gatherings with Friends and Family: More than three times a week    Attends Religious Services: More than 4 times per year    Active Member of  Clubs or Organizations: No    Attends Banker Meetings: Never    Marital Status: Widowed  Intimate Partner Violence: Not At Risk (04/25/2023)   Humiliation, Afraid, Rape, and Kick questionnaire    Fear of Current or Ex-Partner: No    Emotionally Abused: No    Physically Abused: No    Sexually Abused: No    Family History:    Family History  Problem Relation Age of Onset   Asthma Mother    Breast cancer Mother 33   Emphysema Father        smoked   Lung cancer Father        smoked   Coronary artery disease Father 74     ROS:  Please see the history of present illness.   Physical Exam/Data:   Vitals:   04/25/23 1400 04/25/23 1430 04/25/23 1500 04/25/23 1501  BP: 100/67 (!) 113/95 (!) 161/105 (!) 153/99  Pulse: (!) 59 82 99 (!) 118  Resp: 20 20 (!) 34 (!) 32  Temp: 97.8 F (36.6 C)     TempSrc: Oral     SpO2: 100% 100% 98% 100%  Weight:      Height:        Intake/Output Summary (Last 24 hours) at 04/25/2023 1514 Last data filed at 04/25/2023 0458 Gross per 24 hour  Intake 1846.88 ml  Output --  Net 1846.88 ml      04/24/2023   11:04 PM 03/12/2023    2:55 PM 01/22/2023    2:01 PM  Last 3 Weights  Weight (lbs) 193 lb 12.6 oz 193 lb 12.8 oz 194 lb 12.8 oz  Weight (kg) 87.9 kg 87.907 kg 88.361 kg     Body mass index is 33.26 kg/m.  General:  Well nourished, well developed, in no acute distress HEENT: normal Neck: no JVD Vascular: No carotid bruits; Distal pulses 2+ bilaterally Cardiac: Irregular rate/rhythm; normal S1, S2; no murmur  Lungs: Increased work of breathing with expiratory wheezing and rhonchi Abd: soft, nontender, no  hepatomegaly  Ext: no edema Skin: warm and dry  Psych:  Normal affect   EKG:  The EKG was personally reviewed and demonstrates:  atrial fibrillation vs atrial tachycardia with rate 120 bpm Telemetry:  Telemetry was personally reviewed and demonstrates: sinus tachycardia with frequent PACs with several runs of atrial tachycardia/SVT, rates largely controlled at 70-100 bpm  Relevant CV Studies:  09/29/2022 LHC 1.  Severe one-vessel coronary artery disease involving the ostial/proximal LAD 2.  Left ventricular angiography was not performed.  EF was normal by echo.  Normal left ventricular end-diastolic pressure. 3.  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 4.  Frequent supraventricular tachycardia throughout the case which made angiography difficult.  09/29/2022 Echo complete 1. Left ventricular ejection fraction, by estimation, is 55 to 60%. The  left ventricle has normal function. The left ventricle has no regional  wall motion abnormalities. Left ventricular diastolic parameters are  consistent with Grade I diastolic  dysfunction (impaired relaxation).   2. Right ventricular systolic function is normal. The right ventricular  size is normal.   3. Left atrial size was mildly dilated.   4. The mitral valve is normal in structure. No evidence of mitral valve  regurgitation.   5. The aortic valve is tricuspid. Aortic valve regurgitation is not  visualized.  6. The inferior vena cava is normal in size with greater than 50%  respiratory variability, suggesting right atrial pressure of 3 mmHg.   Laboratory Data:  High Sensitivity Troponin:   Recent Labs  Lab 04/25/23 0030 04/25/23 0209 04/25/23 0956 04/25/23 1201  TROPONINIHS 186* 317* 351* 294*     Chemistry Recent Labs  Lab 04/24/23 2341  NA 138  K 3.6  CL  108  CO2 20*  GLUCOSE 157*  BUN 30*  CREATININE 1.72*  CALCIUM 8.9  GFRNONAA 31*  ANIONGAP 10    Recent Labs  Lab 04/24/23 2341  PROT 6.5  ALBUMIN 3.2*  AST 57*  ALT 33  ALKPHOS 141*  BILITOT 0.5   Lipids No results for input(s): "CHOL", "TRIG", "HDL", "LABVLDL", "LDLCALC", "CHOLHDL" in the last 168 hours.  Hematology Recent Labs  Lab 04/24/23 2341  WBC 6.0  RBC 3.57*  HGB 11.8*  HCT 36.2  MCV 101.4*  MCH 33.1  MCHC 32.6  RDW 13.9  PLT 154   Thyroid No results for input(s): "TSH", "FREET4" in the last 168 hours.  BNPNo results for input(s): "BNP", "PROBNP" in the last 168 hours.  DDimer No results for input(s): "DDIMER" in the last 168 hours.   Radiology/Studies:  CT Angio Chest PE W/Cm &/Or Wo Cm Result Date: 04/25/2023 IMPRESSION: 1. No evidence of acute pulmonary embolism. No acute cardiopulmonary process. 2.  Aortic Atherosclerosis (ICD10-I70.0). Electronically Signed   By: Helyn Numbers M.D.   On: 04/25/2023 04:22   CT Head Wo Contrast Result Date: 04/25/2023  IMPRESSION: No acute intracranial abnormality. Electronically Signed   By: Tish Frederickson M.D.   On: 04/25/2023 03:07   DG Chest Port 1 View Result Date: 04/25/2023 IMPRESSION: 1. Question trace left pleural effusion. Recommend repeat chest x-ray PA and lateral view of the chest for further evaluation. 2.  Aortic Atherosclerosis (ICD10-I70.0). Electronically Signed   By: Tish Frederickson M.D.   On: 04/25/2023 03:05   Assessment and Plan:   Atrial fibrillation Atrial tachycardia SVT - Longstanding history of SVT/atrial tachycardia, not tolerant to amiodarone - Initial EKG concerning for atrial fibrillation - Telemetry reviewed, no evidence of atrial fibrillation. Shows sinus rhythm with frequent PACs and runs of atrial tachycardia/SVT. - Received IV diltiazem 10 mg in the ED - Rates overall controlled at this time - Currently anticoagulated with IV heparin in the setting of elevated troponin. Will  need to discuss need for long term DOAC in the setting of possible afib. CHA2DS2VASc 6 (age 36, sex, hx PE, CAD) - Resume PTA diltiazem CD 240 mg   CAD Elevated troponin - Hx CAD with successful PCI/DES placement to the ostial/proximal LAD 09/2022 - Troponin peaked at 351 in the setting of respiratory distress and tachycardia, suspect demand ischemia - Patient with some moderate chest discomfort ongoing for several days, likely secondary to respiratory disease - EKG without acute ischemic changes - Echo ordered, further recommendations pending results - Continue IV heparin for 48 hours  Acute respiratory failure with hypoxia - Failed outpatient regimen, presenting 4/8 with respiratory distress - Previously requiring BiPAP, now weaned to 3L supplemental O2 - Respiratory panel negative - Steroids and nebulizer treatment per IM and pulmonary who is consulted  CKD IIIb - Cr near baseline  For questions or updates, please contact [facility] Please consult [email or web address] for contact info under    Signed, Orion Crook, PA-C  04/25/2023 3:14 PM

## 2023-04-25 NOTE — Progress Notes (Signed)
 Pt taken to CT on the Bipap and returned to ED 3 without incident. Pt remains on the Bipap and is tol well at this time.

## 2023-04-25 NOTE — Assessment & Plan Note (Addendum)
 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 Karna Christmas with pulmonology consulted IV Solu-Medrol and Xopenex Pulmonary started her on doxycycline. - Continue with supportive care - Currently at 3 L of oxygen with no baseline use

## 2023-04-25 NOTE — Progress Notes (Signed)
 Per patient/family request, as much as possible only female caregivers for bathing/toileting

## 2023-04-25 NOTE — Assessment & Plan Note (Signed)
 PPI ?

## 2023-04-25 NOTE — Assessment & Plan Note (Addendum)
 Baseline hx/o SVT and atrial flutter followed by EP  HR currently in 70s  Cont diltiazem  Follow up cardiology recommendations

## 2023-04-25 NOTE — ED Notes (Addendum)
 Marland Kitchen

## 2023-04-25 NOTE — Progress Notes (Signed)
 PHARMACY - ANTICOAGULATION CONSULT NOTE  Pharmacy Consult for Heparin  Indication: chest pain/ACS  Allergies  Allergen Reactions   Amiodarone Other (See Comments)    Fatigue, Nausea, anorexia, malaise   Lactose Other (See Comments) and Diarrhea   Other Other (See Comments)    Pt ONLY tolerates SLOW-FE (slow released iron)---upsets IBS   Biaxin [Clarithromycin] Other (See Comments)    GI upset    Hydrocodone-Acetaminophen Itching    Tolerates acetaminophen    Oxycodone Itching   Sulfa Antibiotics Other (See Comments) and Diarrhea    GI upset  GI upset GI upset    Sulfasalazine Diarrhea    Patient Measurements: Height: 5\' 4"  (162.6 cm) Weight: 87.9 kg (193 lb 12.6 oz) IBW/kg (Calculated) : 54.7 HEPARIN DW (KG): 74.2  Vital Signs: Temp: 99 F (37.2 C) (04/08 2259) Temp Source: Oral (04/08 2259) BP: 145/68 (04/08 2305) Pulse Rate: 105 (04/09 0038)  Labs: Recent Labs    04/24/23 2341 04/25/23 0030  HGB 11.8*  --   HCT 36.2  --   PLT 154  --   CREATININE 1.72*  --   TROPONINIHS  --  186*    Estimated Creatinine Clearance: 30.3 mL/min (A) (by C-G formula based on SCr of 1.72 mg/dL (H)).   Medical History: Past Medical History:  Diagnosis Date   Allergic rhinitis    Anxiety    Arthritis    Asthma    Atrophic vaginitis    B12 deficiency    Bronchitis, chronic (HCC)    CAD (coronary artery disease)    a. 03/2003 Cath: Nl cors; b. 09/2022 Staged PCI: LM nl, LAD 85p/66m (3.5x26 Onyx Frontier DES), RI small, mild dzs, LCX nl, OM2 mild dzs, RCA 30p.   Cervical disc disease    CKD (chronic kidney disease), stage III (HCC)    Collagen vascular disease (HCC)    RA   Cough - post-COVID    Depression    Diastolic dysfunction    a. 11/2020 Echo: EF 45-50%, GrI DD; b. 10/2021 Echo: EF 60-65%, GrII DD; c. 09/2022 Echo: EF 55-60%, no rwma, GrI DD, nl RV fxn, mildly dil LA. No significant valvular dzs.   Fibrocystic breast disease    Foot ulcer (HCC)    GERD  (gastroesophageal reflux disease)    Hammer toe    Hyperlipidemia    IBS (irritable bowel syndrome)    Macrocytic anemia    Macular degeneration 2023   Obesity    OSA (obstructive sleep apnea)    Osteoarthrosis    Multi site   Osteopenia    PSVT (paroxysmal supraventricular tachycardia) (HCC)    a. 04/2022 Zio: Sinus rhythm @ 57 (42-101). Rare PACs/PVCs. 16109 SVT runs lasting up to 66m 12sec - max rate 222. 5 beats NSVT. Triggered events = sinus, SVT, PACs.   Rheumatoid arthritis (HCC)    Shingles    Thyroid nodule    MULTIPLE   Tonsillitis    recurrent   Umbilical hernia     Medications:  (Not in a hospital admission)   Assessment: Pharmacy consulted to heparin in this 78 year old female admitted with ACS/NSTEMI. No prior anticoag noted. CrCl = 30.3 ml/min   Goal of Therapy:  Heparin level 0.3-0.7 units/ml Monitor platelets by anticoagulation protocol: Yes   Plan:  Give 4000 units bolus x 1 Start heparin infusion at 900 units/hr Check anti-Xa level in 8 hours and daily while on heparin Continue to monitor H&H and platelets  Anessa Charley D 04/25/2023,2:13  AM

## 2023-04-25 NOTE — ED Notes (Signed)
 Assisted patient onto bedside commode for a bowel movement.

## 2023-04-25 NOTE — Assessment & Plan Note (Signed)
 Cr 1.7 w/ GFR in the 30s  Near baseline  Monitor

## 2023-04-25 NOTE — ED Notes (Addendum)
 Daughter came to the nurses station stating patient woke up from a nap in a panic and felt like something was stuck in her throat.  She also states this is how the patient was acting last night prior to coming to the ED. O2 increased to 5L, will medicate per [facility] with levalbuterol. RT called to place patient back on bipap, MD [facility] made aware.

## 2023-04-25 NOTE — Assessment & Plan Note (Signed)
 Meeting SIRS criteria with Tmax 100.9, heart rate 100s White count 6 Lactate 1.4 Noted decompensated respiratory failure requiring BiPAP in the setting of eosinophilic asthma CTA negative for any overt infection COVID flu and RSV negative, RVP with coronavirus, preliminary blood cultures negative and significantly elevated procalcitonin -Supportive care

## 2023-04-25 NOTE — Assessment & Plan Note (Addendum)
 Continue leflunomide, Plaquenil, steroid  May need taper given recent stress dose steroid use

## 2023-04-25 NOTE — ED Notes (Signed)
 Patient calm and comfortable in bed, lights off and patient's eyes closed. Daughter remains at bedside.

## 2023-04-25 NOTE — Progress Notes (Signed)
 PHARMACY - ANTICOAGULATION CONSULT NOTE  Pharmacy Consult for Heparin  Indication: chest pain/ACS  Allergies  Allergen Reactions   Amiodarone Other (See Comments)    Fatigue, Nausea, anorexia, malaise   Lactose Other (See Comments) and Diarrhea   Other Other (See Comments)    Pt ONLY tolerates SLOW-FE (slow released iron)---upsets IBS   Biaxin [Clarithromycin] Other (See Comments)    GI upset    Hydrocodone-Acetaminophen Itching    Tolerates acetaminophen    Oxycodone Itching   Sulfa Antibiotics Other (See Comments) and Diarrhea    GI upset  GI upset GI upset    Sulfasalazine Diarrhea    Patient Measurements: Height: 5\' 4"  (162.6 cm) Weight: 87.9 kg (193 lb 12.6 oz) IBW/kg (Calculated) : 54.7 HEPARIN DW (KG): 74.2  Vital Signs: Temp: 98.4 F (36.9 C) (04/09 1445) Temp Source: Oral (04/09 1445) BP: 121/62 (04/09 1900) Pulse Rate: 83 (04/09 1900)  Labs: Recent Labs    04/24/23 2341 04/25/23 0030 04/25/23 0209 04/25/23 0956 04/25/23 1109 04/25/23 1201 04/25/23 2106  HGB 11.8*  --   --   --   --   --   --   HCT 36.2  --   --   --   --   --   --   PLT 154  --   --   --   --   --   --   HEPARINUNFRC  --   --   --   --  0.27*  --  0.20*  CREATININE 1.72*  --   --   --   --   --   --   TROPONINIHS  --    < > 317* 351*  --  294*  --    < > = values in this interval not displayed.    Estimated Creatinine Clearance: 30.3 mL/min (A) (by C-G formula based on SCr of 1.72 mg/dL (H)).   Medical History: Past Medical History:  Diagnosis Date   Allergic rhinitis    Anxiety    Arthritis    Asthma    Atrophic vaginitis    B12 deficiency    Bronchitis, chronic (HCC)    CAD (coronary artery disease)    a. 03/2003 Cath: Nl cors; b. 09/2022 Staged PCI: LM nl, LAD 85p/67m (3.5x26 Onyx Frontier DES), RI small, mild dzs, LCX nl, OM2 mild dzs, RCA 30p.   Cervical disc disease    CKD (chronic kidney disease), stage III (HCC)    Collagen vascular disease (HCC)    RA    Cough - post-COVID    Depression    Diastolic dysfunction    a. 11/2020 Echo: EF 45-50%, GrI DD; b. 10/2021 Echo: EF 60-65%, GrII DD; c. 09/2022 Echo: EF 55-60%, no rwma, GrI DD, nl RV fxn, mildly dil LA. No significant valvular dzs.   Fibrocystic breast disease    Foot ulcer (HCC)    GERD (gastroesophageal reflux disease)    Hammer toe    Hyperlipidemia    IBS (irritable bowel syndrome)    Macrocytic anemia    Macular degeneration 2023   Obesity    OSA (obstructive sleep apnea)    Osteoarthrosis    Multi site   Osteopenia    PSVT (paroxysmal supraventricular tachycardia) (HCC)    a. 04/2022 [facility]: Sinus rhythm @ 57 (42-101). Rare PACs/PVCs. 29562 SVT runs lasting up to 36m 12sec - max rate 222. 5 beats NSVT. Triggered events = sinus, SVT, PACs.   Rheumatoid arthritis (HCC)  Shingles    Thyroid nodule    MULTIPLE   Tonsillitis    recurrent   Umbilical hernia     Medications:  (Not in a hospital admission)   Assessment: Pharmacy consulted to heparin in this 78 year old female admitted with ACS/NSTEMI. No prior anticoag noted. CrCl = 30.3 ml/min   Date/Time HL Rate  Comment 4/9 1109 0.27 900 units/hr Subtherapeutic  4/9 2109 0.20 1050 units/hr SUBtherapeutic  Goal of Therapy:  Heparin level 0.3-0.7 units/ml Monitor platelets by anticoagulation protocol: Yes   Plan:  No issues with infusion reported - Give 2200 unit bolus x 1 - Increase heparin infusion rate to 1250 units/hr - Check heparin level 8 hours after rate change - Monitor CBC daily while on heparin  Thank you for involving pharmacy in this patient's care.   Rockwell Alexandria, PharmD Clinical Pharmacist 04/25/2023 9:34 PM

## 2023-04-25 NOTE — Assessment & Plan Note (Signed)
 CTA today negative for PE  On heparin gtt in setting of NSTEMI

## 2023-04-26 ENCOUNTER — Inpatient Hospital Stay

## 2023-04-26 ENCOUNTER — Inpatient Hospital Stay (HOSPITAL_COMMUNITY)
Admit: 2023-04-26 | Discharge: 2023-04-26 | Disposition: A | Discharge status: Home / Self Care | Attending: Cardiovascular Disease | Admitting: Cardiovascular Disease

## 2023-04-26 DIAGNOSIS — I2489 Other forms of acute ischemic heart disease: Secondary | ICD-10-CM | POA: Diagnosis not present

## 2023-04-26 DIAGNOSIS — A419 Sepsis, unspecified organism: Secondary | ICD-10-CM

## 2023-04-26 DIAGNOSIS — J988 Other specified respiratory disorders: Secondary | ICD-10-CM | POA: Diagnosis not present

## 2023-04-26 DIAGNOSIS — Z86711 Personal history of pulmonary embolism: Secondary | ICD-10-CM

## 2023-04-26 DIAGNOSIS — J9601 Acute respiratory failure with hypoxia: Secondary | ICD-10-CM | POA: Diagnosis not present

## 2023-04-26 DIAGNOSIS — I4891 Unspecified atrial fibrillation: Secondary | ICD-10-CM

## 2023-04-26 DIAGNOSIS — K219 Gastro-esophageal reflux disease without esophagitis: Secondary | ICD-10-CM

## 2023-04-26 DIAGNOSIS — I471 Supraventricular tachycardia, unspecified: Secondary | ICD-10-CM

## 2023-04-26 DIAGNOSIS — N1832 Chronic kidney disease, stage 3b: Secondary | ICD-10-CM

## 2023-04-26 DIAGNOSIS — M069 Rheumatoid arthritis, unspecified: Secondary | ICD-10-CM | POA: Diagnosis not present

## 2023-04-26 LAB — PROCALCITONIN: Procalcitonin: >150 ng/mL

## 2023-04-26 LAB — COMPREHENSIVE METABOLIC PANEL WITH GFR
ALT: 28 U/L (ref 0–44)
AST: 47 U/L — ABNORMAL HIGH (ref 15–41)
Albumin: 2.6 g/dL — ABNORMAL LOW (ref 3.5–5.0)
Alkaline Phosphatase: 107 U/L (ref 38–126)
Anion gap: 9 (ref 5–15)
BUN: 32 mg/dL — ABNORMAL HIGH (ref 8–23)
CO2: 20 mmol/L — ABNORMAL LOW (ref 22–32)
Calcium: 8.4 mg/dL — ABNORMAL LOW (ref 8.9–10.3)
Chloride: 110 mmol/L (ref 98–111)
Creatinine, Ser: 1.54 mg/dL — ABNORMAL HIGH (ref 0.44–1.00)
GFR, Estimated: 35 mL/min — ABNORMAL LOW (ref >60)
Glucose, Bld: 134 mg/dL — ABNORMAL HIGH (ref 70–99)
Potassium: 3.8 mmol/L (ref 3.5–5.1)
Sodium: 139 mmol/L (ref 135–145)
Total Bilirubin: 0.6 mg/dL (ref 0.0–1.2)
Total Protein: 5.2 g/dL — ABNORMAL LOW (ref 6.5–8.1)

## 2023-04-26 LAB — ECHOCARDIOGRAM COMPLETE
AR max vel: 3.14 cm2
AV Area VTI: 3.31 cm2
AV Area mean vel: 3.06 cm2
AV Mean grad: 5 mmHg
AV Peak grad: 9 mmHg
Ao pk vel: 1.5 m/s
Area-P 1/2: 2.88 cm2
Calc EF: 59.8 %
Height: 64 in
MV VTI: 4 cm2
S' Lateral: 2.9 cm
Single Plane A2C EF: 53 %
Single Plane A4C EF: 65.3 %
Weight: 3100.55 [oz_av]

## 2023-04-26 LAB — CBC
HCT: 28.7 % — ABNORMAL LOW (ref 36.0–46.0)
Hemoglobin: 9.2 g/dL — ABNORMAL LOW (ref 12.0–15.0)
MCH: 31.7 pg (ref 26.0–34.0)
MCHC: 32.1 g/dL (ref 30.0–36.0)
MCV: 99 fL (ref 80.0–100.0)
Platelets: 113 10*3/uL — ABNORMAL LOW (ref 150–400)
RBC: 2.9 MIL/uL — ABNORMAL LOW (ref 3.87–5.11)
RDW: 14.2 % (ref 11.5–15.5)
WBC: 5.8 10*3/uL (ref 4.0–10.5)
nRBC: 0 % (ref 0.0–0.2)

## 2023-04-26 LAB — HEPARIN LEVEL (UNFRACTIONATED)
Heparin Unfractionated: 0.41 [IU]/mL (ref 0.30–0.70)
Heparin Unfractionated: 0.37 [IU]/mL (ref 0.30–0.70)

## 2023-04-26 LAB — TSH: TSH: 0.649 u[IU]/mL (ref 0.350–4.500)

## 2023-04-26 LAB — C-REACTIVE PROTEIN: CRP: 12.5 mg/dL — ABNORMAL HIGH (ref <1.0)

## 2023-04-26 MED ORDER — CLOPIDOGREL BISULFATE 75 MG PO TABS
75.0000 mg | ORAL_TABLET | Freq: Every day | ORAL | Status: DC
  Administered 2023-04-27: 75 mg via ORAL
  Filled 2023-04-26: qty 1

## 2023-04-26 MED ORDER — SERTRALINE HCL 50 MG PO TABS
50.0000 mg | ORAL_TABLET | Freq: Every day | ORAL | Status: DC
  Administered 2023-04-26 – 2023-04-27 (×2): 50 mg via ORAL
  Filled 2023-04-26 (×2): qty 1

## 2023-04-26 MED ORDER — GABAPENTIN 300 MG PO CAPS
300.0000 mg | ORAL_CAPSULE | Freq: Every day | ORAL | Status: DC
  Administered 2023-04-26: 300 mg via ORAL
  Filled 2023-04-26: qty 1

## 2023-04-26 MED ORDER — FAMOTIDINE 20 MG PO TABS: 20.0000 mg | ORAL_TABLET | Freq: Every day | ORAL | Status: DC | PRN

## 2023-04-26 MED ORDER — PANTOPRAZOLE SODIUM 40 MG PO TBEC
40.0000 mg | DELAYED_RELEASE_TABLET | Freq: Every day | ORAL | Status: DC
  Administered 2023-04-26 – 2023-04-27 (×2): 40 mg via ORAL
  Filled 2023-04-26 (×2): qty 1

## 2023-04-26 MED ORDER — PIPERACILLIN-TAZOBACTAM 3.375 G IVPB: 3.3750 g | Freq: Three times a day (TID) | INTRAVENOUS | Status: DC

## 2023-04-26 MED ORDER — MONTELUKAST SODIUM 10 MG PO TABS: 10.0000 mg | ORAL_TABLET | Freq: Every day | ORAL | Status: DC | PRN

## 2023-04-26 MED ORDER — PIPERACILLIN-TAZOBACTAM 3.375 G IVPB
3.3750 g | Freq: Once | INTRAVENOUS | Status: DC
  Filled 2023-04-26: qty 50

## 2023-04-26 MED ORDER — LEVALBUTEROL HCL 0.63 MG/3ML IN NEBU
0.6300 mg | INHALATION_SOLUTION | Freq: Four times a day (QID) | RESPIRATORY_TRACT | Status: DC
  Administered 2023-04-26 – 2023-04-27 (×4): 0.63 mg via RESPIRATORY_TRACT
  Filled 2023-04-26 (×3): qty 3

## 2023-04-26 MED ORDER — BUDESON-GLYCOPYRROL-FORMOTEROL 160-9-4.8 MCG/ACT IN AERO
2.0000 | INHALATION_SPRAY | Freq: Two times a day (BID) | RESPIRATORY_TRACT | Status: DC
  Administered 2023-04-26 (×2): 2 via RESPIRATORY_TRACT
  Filled 2023-04-26: qty 5.9

## 2023-04-26 MED ORDER — DOXYCYCLINE HYCLATE 100 MG PO TABS
100.0000 mg | ORAL_TABLET | Freq: Two times a day (BID) | ORAL | Status: DC
  Administered 2023-04-26 – 2023-04-27 (×3): 100 mg via ORAL
  Filled 2023-04-26 (×3): qty 1

## 2023-04-26 MED ORDER — LEFLUNOMIDE 10 MG PO TABS
10.0000 mg | ORAL_TABLET | Freq: Every day | ORAL | Status: DC
  Administered 2023-04-26 – 2023-04-27 (×2): 10 mg via ORAL
  Filled 2023-04-26 (×2): qty 1

## 2023-04-26 NOTE — ED Notes (Signed)
 After contacting provider, pt noted to be sleeping. Daughter at bedside states to not worry about giving medications at this time since pt sleeping. Provider aware

## 2023-04-26 NOTE — Progress Notes (Signed)
*  PRELIMINARY RESULTS* Echocardiogram 2D Echocardiogram has been performed.  Courtney Wilcox 04/26/2023, 12:39 PM

## 2023-04-26 NOTE — Consult Note (Signed)
 Pharmacy Antibiotic Note  Courtney Wilcox is a 78 y.o. female medical history significant of SVT, depression, HTN, OSA, RA, hx/o PE, eosinophilic asthma  admitted on 04/24/2023 with  intra-abdominal infection .  Pharmacy has been consulted for Zosyn dosing.  Viral panel positive for coronavirus type  Plan:  Zosyn 3.375g IV q8h (4 hour infusion).  Height: 5\' 4"  (162.6 cm) Weight: 87.9 kg (193 lb 12.6 oz) IBW/kg (Calculated) : 54.7  Temp (24hrs), Avg:98.1 F (36.7 C), Min:97.6 F (36.4 C), Max:98.5 F (36.9 C)  Recent Labs  Lab 04/24/23 2341 04/25/23 0030 04/25/23 0209 04/26/23 0508  WBC 6.0  --   --  5.8  CREATININE 1.72*  --   --  1.54*  LATICACIDVEN  --  1.4 1.5  --     Estimated Creatinine Clearance: 33.9 mL/min (A) (by C-G formula based on SCr of 1.54 mg/dL (H)).    Allergies  Allergen Reactions   Amiodarone Other (See Comments)    Fatigue, Nausea, anorexia, malaise   Lactose Other (See Comments) and Diarrhea   Other Other (See Comments)    Pt ONLY tolerates SLOW-FE (slow released iron)---upsets IBS   Biaxin [Clarithromycin] Other (See Comments)    GI upset    Hydrocodone-Acetaminophen Itching    Tolerates acetaminophen    Oxycodone Itching   Sulfa Antibiotics Other (See Comments) and Diarrhea    GI upset  GI upset GI upset    Sulfasalazine Diarrhea    Antimicrobials this admission: Azithromycin 4/9 x 1 Ceftriaxone 4/9 x 1 Zosyn 4/10 >>   Dose adjustments this admission: N/A  Microbiology results: 4/9 BCx: NGTD 4/9 RVP: (+) Coronavirus NL63  Thank you for allowing pharmacy to be a part of this patient's care.  Tressie Ellis 04/26/2023 10:19 AM

## 2023-04-26 NOTE — Progress Notes (Signed)
 Progress Note  Patient Name: Courtney Wilcox Date of Encounter: 04/26/2023  Primary Cardiologist: End  Subjective   No chest pain. Dyspnea persists. Did not receive PTA Cardizem yesterday in the setting of concern for BP in the 1-teens to 120s mmHg systolic (received Ativan). Hgb trending from 11.8 to 9.2 over the past 24 hours. SCr 1.72 to 1.54 over the past 48 hours. Covid positive on respiratory panel.   Inpatient Medications    Scheduled Meds:  diltiazem  240 mg Oral Daily   levalbuterol  0.63 mg Nebulization Q6H WA   predniSONE  40 mg Oral Q breakfast   Continuous Infusions:  sodium chloride 40 mL/hr at 04/26/23 0124   heparin 1,250 Units/hr (04/26/23 0636)   PRN Meds: LORazepam, ondansetron **OR** ondansetron (ZOFRAN) IV   Vital Signs    Vitals:   04/26/23 0600 04/26/23 0800 04/26/23 0804 04/26/23 0918  BP: (!) 117/52 118/62    Pulse: (!) 58 (!) 57    Resp: 18 18    Temp: 98.2 F (36.8 C)   97.6 F (36.4 C)  TempSrc: Oral   Oral  SpO2: 99% 100% 99%   Weight:      Height:        Intake/Output Summary (Last 24 hours) at 04/26/2023 0933 Last data filed at 04/26/2023 0805 Gross per 24 hour  Intake 662.89 ml  Output 1300 ml  Net -637.11 ml   Filed Weights   04/24/23 2304  Weight: 87.9 kg    Telemetry    SR with rates in the 60s bpm, brief paroxysms of tachycardic rates in the 120s bpm concerning for possible atrial tachcyardia - Personally Reviewed  ECG    No new tracings - Personally Reviewed  Physical Exam   GEN: No acute distress.   Neck: No JVD. Cardiac: RRR, no murmurs, rubs, or gallops.  Respiratory: Mild expiratory wheezing bilaterally.  GI: Soft, nontender, non-distended.   MS: No edema; No deformity. Neuro:  Alert and oriented x 3; Nonfocal.  Psych: Normal affect.  Labs    Chemistry Recent Labs  Lab 04/24/23 2341 04/26/23 0508  NA 138 139  K 3.6 3.8  CL 108 110  CO2 20* 20*  GLUCOSE 157* 134*  BUN 30* 32*  CREATININE  1.72* 1.54*  CALCIUM 8.9 8.4*  PROT 6.5 5.2*  ALBUMIN 3.2* 2.6*  AST 57* 47*  ALT 33 28  ALKPHOS 141* 107  BILITOT 0.5 0.6  GFRNONAA 31* 35*  ANIONGAP 10 9     Hematology Recent Labs  Lab 04/24/23 2341 04/26/23 0508  WBC 6.0 5.8  RBC 3.57* 2.90*  HGB 11.8* 9.2*  HCT 36.2 28.7*  MCV 101.4* 99.0  MCH 33.1 31.7  MCHC 32.6 32.1  RDW 13.9 14.2  PLT 154 113*    Cardiac EnzymesNo results for input(s): "TROPONINI" in the last 168 hours. No results for input(s): "TROPIPOC" in the last 168 hours.   BNPNo results for input(s): "BNP", "PROBNP" in the last 168 hours.   DDimer No results for input(s): "DDIMER" in the last 168 hours.   Radiology    CT Angio Chest PE W/Cm &/Or Wo Cm Result Date: 04/25/2023 IMPRESSION: 1. No evidence of acute pulmonary embolism. No acute cardiopulmonary process. 2.  Aortic Atherosclerosis (ICD10-I70.0). Electronically Signed   By: Helyn Numbers M.D.   On: 04/25/2023 04:22   CT Head Wo Contrast Result Date: 04/25/2023 IMPRESSION: No acute intracranial abnormality. Electronically Signed   By: Normajean Glasgow.D.  On: 04/25/2023 03:07   DG Chest Port 1 View Result Date: 04/25/2023 IMPRESSION: 1. Question trace left pleural effusion. Recommend repeat chest x-ray PA and lateral view of the chest for further evaluation. 2.  Aortic Atherosclerosis (ICD10-I70.0). Electronically Signed   By: Tish Frederickson M.D.   On: 04/25/2023 03:05    Cardiac Studies   2D echo pending __________  [facility] 10/02/2022:   Prox LAD lesion is 85% stenosed.   Prox LAD to Mid LAD lesion is 50% stenosed.   Prox RCA lesion is 30% stenosed.   A drug-eluting stent was successfully placed using a STENT ONYX FRONTIER 3.5X26.   Post intervention, there is a 10% residual stenosis.   Post intervention, there is a 0% residual stenosis.   Successful drug-eluting stent placement to the ostial/proximal LAD.  The lesion is very eccentric and fibrotic in the proximal segment and thus there  was a 10% residual stenosis and spite of high-pressure noncompliant balloon.   Recommendations: Dual antiplatelet therapy for at least 6 months. Aggressive treatment of risk factors. Hydrate overnight and check renal function in the morning.  68 mL of contrast was used for the procedure. __________  [facility] 09/29/2022:   Prox RCA lesion is 30% stenosed.   Prox LAD lesion is 85% stenosed.   Prox LAD to Mid LAD lesion is 50% stenosed.   1.  Severe one-vessel coronary artery disease involving the ostial/proximal LAD 2.  Left ventricular angiography was not performed.  EF was normal by echo.  Normal left ventricular end-diastolic pressure. 3.  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 4.  Frequent supraventricular tachycardia throughout the case which made angiography difficult.   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  In addition, SVT has to be well-controlled before the procedure and thus I elected to start the patient on oral amiodarone as we are not able to increase diltiazem due to resting bradycardia. Given that the patient's pulmonary embolism was more than a year ago and she will require dual antiplatelet therapy, I favor not resuming Eliquis especially that recent CTA showed no evidence of pulmonary embolism.  I started aspirin and clopidogrel. The echo from today did not show evidence of pulmonary hypertension and thus no need to attempt right heart catheterization at this time. __________   [facility] patch 04/2022:   The patient was monitored for 14 days.   The predominant rhythm was sinus with an average rate of 57 bpm while in sinus rhythm (range 42-101 bpm).   There were rare PACs and PVCs.   25,709 episode of  supraventricular tachycardia cardia occurred, lasting up to 40:12 minutes with a maximum rate of 222 bpm.   A single 5 beat run of nonsustained ventricular tachycardia was observed with a maximum rate of 174 bpm.   There was no prolonged pause.   Patient triggered events corresponded to sinus rhythm, SVT, and PACs.   Sinus rhythm with numerous episodes of SVT lasting up to 40 minutes, as detailed above.  Rare PACs and PVCs as well as a single brief run of NSVT also noted. __________   2D echo 10/31/2021: 1. Left ventricular ejection fraction, by estimation, is 60 to 65%. The  left ventricle has normal function. The left ventricle has  no regional  wall motion abnormalities. There is mild left ventricular hypertrophy.  Left ventricular diastolic parameters  are consistent with Grade II diastolic dysfunction (pseudonormalization).   2. Right ventricular systolic function is normal. The right ventricular  size is normal.   3. Left atrial size was severely dilated.   4. The mitral valve is normal in structure. Mild mitral valve  regurgitation.   5. The aortic valve is tricuspid. Aortic valve regurgitation is not  visualized.   6. The inferior vena cava is normal in size with greater than 50%  respiratory variability, suggesting right atrial pressure of 3 mmHg.  __________   2D echo 11/20/2020: 1. Left ventricular ejection fraction, by estimation, is 45 to 50%. The  left ventricle has mildly decreased function. The left ventricle  demonstrates global hypokinesis. The left ventricular internal cavity size  was mildly to moderately dilated. There  is mild concentric left ventricular hypertrophy. Left ventricular  diastolic parameters are consistent with Grade I diastolic dysfunction  (impaired relaxation).   2. Right ventricular systolic function is mildly reduced. The right  ventricular size is normal.   3. Left atrial size was severely dilated.   4. Right atrial size was moderately  dilated.   5. The mitral valve is grossly normal. Mild to moderate mitral valve  regurgitation.   6. Tricuspid valve regurgitation is mild to moderate.   7. The aortic valve is calcified. Aortic valve regurgitation is not  visualized. Mild aortic valve sclerosis is present, with no evidence of  aortic valve stenosis.   Patient Profile     78 y.o. female with history of CAD s/p PCI to the ostial/proximal LAD in 09/2022, SVT, PE in 11/2020, chronic hypoxic respiratory failure, CKD stage IIIb, HTN, RA, and OSA who was admitted with acute hypoxic respiratory failure with Covid and we are seeing for elevated troponin and tachyarrhythmia.   Assessment & Plan    1. Possible Afib with history of SVT/MAT: -Maintaining sinus rhythm with brief paroxysms of narrow complex tachycardia concerning for atrial tachycardia -Unable to exclude Afib on EKG at arrival  -Likely driven by respiratory distress -Give PTA Cardizem 240 mg daily -Wean steroids as able -Xopenex in place of albuterol  -Currently on heparin gtt, monitor Hgb and PLT count -CHADS2VASc at least 6 (HTN,age x 2, DM, vascular disease, sex category) -Previously intolerant to amiodarone due to nausea  -If tachyarrhythmias persist, consider EP evaluation  -Check TSH  2. CAD involving the native coronary arteries with elevated troponin: -No chest pain -Mildly elevated, peaking at 351, now down trending -On heparin gtt as above -Echo pending -Felt to represent supply demand ischemia in the setting of respiratory distress and tachyarrhythmia -No immediate plans for ischemic testing    3. Acute hypoxic respiratory failure with Covid: -Per IM  4. Acute on CKD stage IIIb: -Improving -Monitor  5. Acute on chronic macrocytic anemia with thrombocytopenia: -No symptoms concerning for bleeding -Possibly dilutional  -Monitor on heparin gtt      For questions or updates, please contact [facility] Please consult [email or web address] for  contact info under Cardiology/STEMI.    Signed, Eula Listen, PA-C [facility] Pager: [phone] 04/26/2023, 9:33 AM

## 2023-04-26 NOTE — Consult Note (Signed)
 PULMONOLOGY         Date: 04/26/2023,   MRN# [id number] Courtney Wilcox 12-Jul-1945     AdmissionWeight: 87.9 kg                 CurrentWeight: 87.9 kg  Referring provider: Dr Alvester Morin   CHIEF COMPLAINT:   Acute on chronic hypoxemic respiratory failure   HISTORY OF PRESENT ILLNESS   This is a pleasant 78 yo F with hx of Allergic atopic asthma and chronic cough as well as complex comorbid medical history as detailed below in PMH.  She is well known to me and was seen by me in clinic with reports of feeling severe fatigue, worsening cough with expectoration of phlegm and myalgias.  She was accompanied by daughter who relates patient is feeling sick and not in her usual state of health. We treated her for acute asthma exacerbation with steroids and antibiotics.  Per daughter patient continued to get worse and developed fevers while at home then declined and was altered. She ended up "slipping of her seat to floor" and EMS was notified to get emergency medical help.  She was brought to ER with viral testing showing positive test for coronavirus. She had elevationin procacitonin and abd distension with discomfort so abd/pelvic CT was done and showed GB sludge but no cholecystitis. She had elevated cardiac biomarkers and cardiology had evaluated aptietn with echo in process.  She is now interactive and is being treated medically for pneumonia. PCCM consulted for additional evaluation and management per family request.    PAST MEDICAL HISTORY   Past Medical History:  Diagnosis Date   Allergic rhinitis    Anxiety    Arthritis    Asthma    Atrophic vaginitis    B12 deficiency    Bronchitis, chronic (HCC)    CAD (coronary artery disease)    a. 03/2003 Cath: Nl cors; b. 09/2022 Staged PCI: LM nl, LAD 85p/91m (3.5x26 Onyx Frontier DES), RI small, mild dzs, LCX nl, OM2 mild dzs, RCA 30p.   Cervical disc disease    CKD (chronic kidney disease), stage III (HCC)    Collagen vascular  disease (HCC)    RA   Cough - post-COVID    Depression    Diastolic dysfunction    a. 11/2020 Echo: EF 45-50%, GrI DD; b. 10/2021 Echo: EF 60-65%, GrII DD; c. 09/2022 Echo: EF 55-60%, no rwma, GrI DD, nl RV fxn, mildly dil LA. No significant valvular dzs.   Fibrocystic breast disease    Foot ulcer (HCC)    GERD (gastroesophageal reflux disease)    Hammer toe    Hyperlipidemia    IBS (irritable bowel syndrome)    Macrocytic anemia    Macular degeneration 2023   Obesity    OSA (obstructive sleep apnea)    Osteoarthrosis    Multi site   Osteopenia    PSVT (paroxysmal supraventricular tachycardia) (HCC)    a. 04/2022 Zio: Sinus rhythm @ 57 (42-101). Rare PACs/PVCs. 56433 SVT runs lasting up to 39m 12sec - max rate 222. 5 beats NSVT. Triggered events = sinus, SVT, PACs.   Rheumatoid arthritis (HCC)    Shingles    Thyroid nodule    MULTIPLE   Tonsillitis    recurrent   Umbilical hernia      SURGICAL HISTORY   Past Surgical History:  Procedure Laterality Date   BACK SURGERY  2011   LAMINECTOMY   BREAST BIOPSY Right  neg   CERVICAL FUSION  2011, 2012   X 2   COLONOSCOPY  2005, 2015   COLONOSCOPY WITH PROPOFOL N/A 08/04/2020   Procedure: COLONOSCOPY WITH PROPOFOL;  Surgeon: Earline Mayotte, MD;  Location: [facility] ENDOSCOPY;  Service: Endoscopy;  Laterality: N/A;   CORONARY STENT INTERVENTION N/A 10/02/2022   Procedure: CORONARY STENT INTERVENTION;  Surgeon: Iran Ouch, MD;  Location: [facility] INVASIVE CV LAB;  Service: Cardiovascular;  Laterality: N/A;   ESOPHAGOGASTRODUODENOSCOPY (EGD) WITH PROPOFOL N/A 09/26/2021   Procedure: ESOPHAGOGASTRODUODENOSCOPY (EGD) WITH PROPOFOL;  Surgeon: Jaynie Collins, DO;  Location: [facility] ENDOSCOPY;  Service: Gastroenterology;  Laterality: N/A;   ESOPHAGOGASTRODUODENOSCOPY ENDOSCOPY  2005, 2013, 2015   EYE SURGERY Bilateral    Cataract Extraction with IOL    HERNIA REPAIR     JOINT REPLACEMENT     KNEE ARTHROPLASTY Left 07/12/2016    Procedure: COMPUTER ASSISTED TOTAL KNEE ARTHROPLASTY;  Surgeon: Donato Heinz, MD;  Location: [facility] ORS;  Service: Orthopedics;  Laterality: Left;   Laryngeal tear after intubation w/repair     LUMBAR DISC SURGERY  12/2009   REPLACEMENT TOTAL KNEE Right 06/2007   DR. HOOTEN, [facility]   REPLACEMENT UNICONDYLAR JOINT KNEE     DR. HOOTEN, [facility]   RIGHT/LEFT HEART CATH AND CORONARY ANGIOGRAPHY N/A 09/29/2022   Procedure: RIGHT/LEFT HEART CATH AND CORONARY ANGIOGRAPHY;  Surgeon: Iran Ouch, MD;  Location: [facility] INV[facility] CV LAB;  Service: Cardiovascular;  Laterality: N/A;   SHOULDER ARTHROSCOPY WITH ROTATOR CUFF REPAIR Right    TONSILLECTOMY     TOTAL HIP ARTHROPLASTY Right 01/31/2017   Procedure: TOTAL HIP ARTHROPLASTY;  Surgeon: Donato Heinz, MD;  Location: [facility] ORS;  Service: Orthopedics;  Laterality: Right;   TUBAL LIGATION     UPPER GI ENDOSCOPY       FAMILY HISTORY   Family History  Problem Relation Age of Onset   Asthma Mother    Breast cancer Mother 43   Emphysema Father        smoked   Lung cancer Father        smoked   Coronary artery disease Father 85     SOCIAL HISTORY   Social History   Tobacco Use   Smoking status: Never   Smokeless tobacco: Never  Vaping Use   Vaping status: Never Used  Substance Use Topics   Alcohol use: No   Drug use: No     MEDICATIONS    Home Medication:  Current Outpatient Rx   Order #: [id number] Class: Historical Med   Order #: [id number] Class: Normal   Order #: [id number] Class: Normal   Order #: [id number] Class: Historical Med   Order #: [id number] Class: Historical Med   Order #: [id number] Class: Normal   Order #: [id number] Class: Historical Med   Order #: [id number] Class: Historical Med   Order #: [id number] Class: Historical Med   Order #: [id number] Class: Historical Med   Order #: [id number] Class: Historical Med   Order #: [id number] Class: Historical Med   Order #: [id number] Class: Historical Med   Order #: [id number] Class:  Historical Med   Order #: [id number] Class: Historical Med   Order #: [id number] Class: Historical Med   Order #: [id number] Class: Normal   Order #: [id number] Class: Normal   Order #: [id number] Class: Historical Med   Order #: [id number] Class: Historical Med   Order #: [id number] Class: Historical Med   Order #: [id number] Class: Historical Med   Order #: [id number] Class: Normal   Order #: [id number] Class: Normal   Order #: [id number] Class: Historical Med   Order #:  [id number] Class: No Print    Current Medication:  Current Facility-Administered Medications:    budeson-glycopyrrolate-formoterol (BREZTRI) 160-9-4.8 MCG/ACT inhaler 2 puff, 2 puff, Inhalation, BID, Arnetha Courser, MD   [START ON 04/27/2023] clopidogrel (PLAVIX) tablet 75 mg, 75 mg, Oral, Q breakfast, Arnetha Courser, MD   diltiazem (CARDIZEM CD) 24 hr capsule 240 mg, 240 mg, Oral, Daily, Ames Dura L, PA-C, 240 mg at 04/26/23 4098   famotidine (PEPCID) tablet 20 mg, 20 mg, Oral, Daily PRN, Arnetha Courser, MD   gabapentin (NEURONTIN) capsule 300 mg, 300 mg, Oral, QHS, Amin, Sumayya, MD   heparin ADULT infusion 100 units/mL (25000 units/233mL), 1,250 Units/hr, Intravenous, Continuous, Rockwell Alexandria, RPH, Last Rate: 12.5 mL/hr at 04/26/23 0636, 1,250 Units/hr at 04/26/23 0636   leflunomide (ARAVA) tablet 10 mg, 10 mg, Oral, Daily, Arnetha Courser, MD   levalbuterol (XOPENEX) nebulizer solution 0.63 mg, 0.63 mg, Nebulization, Q6H WA, Arnetha Courser, MD, 0.63 mg at 04/26/23 0940   LORazepam (ATIVAN) injection 0.5 mg, 0.5 mg, Intravenous, Q6H PRN, Floydene Flock, MD, 0.5 mg at 04/26/23 0209   montelukast (SINGULAIR) tablet 10 mg, 10 mg, Oral, Daily PRN, Arnetha Courser, MD   ondansetron (ZOFRAN) tablet 4 mg, 4 mg, Oral, Q6H PRN **OR** ondansetron (ZOFRAN) injection 4 mg, 4 mg, Intravenous, Q6H PRN, Floydene Flock, MD   pantoprazole (PROTONIX) EC tablet 40 mg, 40 mg, Oral, Daily, Amin, Sumayya, MD   piperacillin-tazobactam (ZOSYN) IVPB 3.375 g,  3.375 g, Intravenous, Once **FOLLOWED BY** piperacillin-tazobactam (ZOSYN) IVPB 3.375 g, 3.375 g, Intravenous, Q8H, Chappell, Alex B, RPH   [COMPLETED] methylPREDNISolone sodium succinate (SOLU-MEDROL) 40 mg/mL injection 40 mg, 40 mg, Intravenous, Q12H, 40 mg at 04/26/23 0403 **FOLLOWED BY** predniSONE (DELTASONE) tablet 40 mg, 40 mg, Oral, Q breakfast, Floydene Flock, MD   sertraline (ZOLOFT) tablet 50 mg, 50 mg, Oral, Daily, Arnetha Courser, MD  Current Outpatient Medications:    acetaminophen (TYLENOL) 500 MG tablet, Take 1,000 mg by mouth 3 (three) times daily as needed for mild pain. , Disp: , Rfl:    ALPRAZolam (XANAX) 0.5 MG tablet, Take 1 tablet (0.5 mg total) by mouth at bedtime as needed for sleep or anxiety., Disp: 30 tablet, Rfl: 0   aspirin 81 MG chewable tablet, Chew 1 tablet (81 mg total) by mouth daily., Disp: 30 tablet, Rfl: 1   azithromycin (ZITHROMAX) 250 MG tablet, Take 250 mg by mouth daily., Disp: , Rfl:    Cholecalciferol (VITAMIN D) 2000 units tablet, Take 2,000 Units by mouth daily., Disp: , Rfl:    diltiazem (CARDIZEM CD) 240 MG 24 hr capsule, Take 1 capsule (240 mg total) by mouth daily., Disp: 90 capsule, Rfl: 3   Dupilumab 300 MG/2ML SOPN, Inject 4 mLs into the skin every 14 (fourteen) days., Disp: , Rfl:    EPINEPHrine 0.3 mg/0.3 mL IJ SOAJ injection, Inject 0.3 mg into the muscle as needed., Disp: , Rfl:    famotidine (PEPCID) 20 MG tablet, Take 20 mg by mouth daily as needed., Disp: , Rfl:    Fluticasone-Umeclidin-Vilant 100-62.5-25 MCG/ACT AEPB, Inhale into the lungs. Inhale 1 Puff into the lungs once daily, Disp: , Rfl:    gabapentin (NEURONTIN) 300 MG capsule, Take 300 mg by mouth at bedtime., Disp: , Rfl:    leflunomide (ARAVA) 10 MG tablet, Take 10 mg by mouth daily., Disp: , Rfl:    levalbuterol (XOPENEX) 0.63 MG/3ML nebulizer solution, Take 0.63 mg by nebulization every 6 (six) hours as needed for wheezing., Disp: , Rfl:  meclizine (ANTIVERT) 12.5 MG tablet,  PLEASE SEE ATTACHED FOR DETAILED DIRECTIONS, Disp: , Rfl:    montelukast (SINGULAIR) 10 MG tablet, Take 10 mg by mouth daily as needed (for allergies.). , Disp: , Rfl:    nystatin (MYCOSTATIN) 100000 UNIT/ML suspension, Take 5 mLs by mouth as needed., Disp: , Rfl:    pantoprazole (PROTONIX) 40 MG tablet, Take 1 tablet (40 mg total) by mouth 2 (two) times daily. (Patient taking differently: Take 40 mg by mouth daily.), Disp: 60 tablet, Rfl: 1   predniSONE (DELTASONE) 2.5 MG tablet, Take 1 tablet (2.5 mg total) by mouth daily with breakfast., Disp: 30 tablet, Rfl: 0   sertraline (ZOLOFT) 25 MG tablet, Take 50 mg by mouth daily. 50 mg total, Disp: , Rfl:    Simethicone 180 MG CAPS, Take 1-2 capsules by mouth as needed., Disp: , Rfl:    triamcinolone (NASACORT) 55 MCG/ACT AERO nasal inhaler, Place 2 sprays into the nose daily., Disp: , Rfl:    ZENPEP 25000-79000 units CPEP, Take by mouth. Occasionally takes with a large meal, Disp: , Rfl:    atorvastatin (LIPITOR) 80 MG tablet, Take 1 tablet (80 mg total) by mouth daily. (Patient not taking: Reported on 04/25/2023), Disp: 90 tablet, Rfl: 0   clopidogrel (PLAVIX) 75 MG tablet, TAKE 1 TABLET BY MOUTH DAILY WITH BREAKFAST., Disp: 90 tablet, Rfl: 0   Cyanocobalamin 1000 MCG/ML KIT, Inject as directed every 30 (thirty) days., Disp: , Rfl:    hydroxychloroquine (PLAQUENIL) 200 MG tablet, Take 1 tablet (200 mg total) by mouth daily. (Patient not taking: Reported on 04/25/2023), Disp: 1 tablet, Rfl: 0    ALLERGIES   Amiodarone, Lactose, Other, Biaxin [clarithromycin], Hydrocodone-acetaminophen, Oxycodone, Sulfa antibiotics, and Sulfasalazine     REVIEW OF SYSTEMS    Review of Systems:  Gen:  Denies  fever, sweats, chills weigh loss  HEENT: Denies blurred vision, double vision, ear pain, eye pain, hearing loss, nose bleeds, sore throat Cardiac:  No dizziness, chest pain or heaviness, chest tightness,edema Resp:   reports dyspnea chronically  Gi:  Denies swallowing difficulty, stomach pain, nausea or vomiting, diarrhea, constipation, bowel incontinence Gu:  Denies bladder incontinence, burning urine Ext:   Denies Joint pain, stiffness or swelling Skin: Denies  skin rash, easy bruising or bleeding or hives Endoc:  Denies polyuria, polydipsia , polyphagia or weight change Psych:   Denies depression, insomnia or hallucinations   Other:  All other systems negative   VS: BP 139/70   Pulse 60   Temp 97.6 F (36.4 C) (Oral)   Resp 20   Ht 5\' 4"  (1.626 m)   Wt 87.9 kg   SpO2 100%   BMI 33.26 kg/m      PHYSICAL EXAM    GENERAL:NAD, no fevers, chills, no weakness no fatigue HEAD: Normocephalic, atraumatic.  EYES: Pupils equal, round, reactive to light. Extraocular muscles intact. No scleral icterus.  MOUTH: Moist mucosal membrane. Dentition intact. No abscess noted.  EAR, NOSE, THROAT: Clear without exudates. No external lesions.  NECK: Supple. No thyromegaly. No nodules. No JVD.  PULMONARY: decreased breath sounds with mild rhonchi worse at bases bilaterally.  CARDIOVASCULAR: S1 and S2. Regular rate and rhythm. No murmurs, rubs, or gallops. No edema. Pedal pulses 2+ bilaterally.  GASTROINTESTINAL: Soft, nontender, nondistended. No masses. Positive bowel sounds. No hepatosplenomegaly.  MUSCULOSKELETAL: No swelling, clubbing, or edema. Range of motion full in all extremities.  NEUROLOGIC: Cranial nerves II through XII are intact. No gross focal neurological deficits. Sensation intact.  Reflexes intact.  SKIN: No ulceration, lesions, rashes, or cyanosis. Skin warm and dry. Turgor intact.  PSYCHIATRIC: Mood, affect within normal limits. The patient is awake, alert and oriented x 3. Insight, judgment intact.       IMAGING     Narrative & Impression  CLINICAL DATA:  Abdominal pain, acute, nonlocalized   EXAM: CT ABDOMEN AND PELVIS WITHOUT CONTRAST   TECHNIQUE: Multidetector CT imaging of the abdomen and pelvis was  performed following the standard protocol without IV contrast.   RADIATION DOSE REDUCTION: This exam was performed according to the departmental dose-optimization program which includes automated exposure control, adjustment of the mA and/or kV according to patient size and/or use of iterative reconstruction technique.   COMPARISON:  CT abdomen/pelvis dated June 25, 2020.   FINDINGS: Lower chest: Mild bibasilar subsegmental atelectasis.   Hepatobiliary: No suspicious focal hepatic lesion, within the limits of an unenhanced exam. Probable sludge in a partially distended gallbladder. No gallbladder wall thickening or pericholecystic fluid. No biliary dilatation.   Pancreas: Unremarkable. No pancreatic ductal dilatation or surrounding inflammatory changes.   Spleen: Normal in size without focal abnormality.   Adrenals/Urinary Tract: Adrenal glands are unremarkable. No renal calculi or hydronephrosis. Evaluation of the distal ureters and bladder is limited due to streak artifact from indwelling right hip orthopedic hardware.   Stomach/Bowel: Similar small hiatal hernia. Appendix appears normal. No evidence of obstruction or focal inflammatory change.   Vascular/Lymphatic: The abdominal aorta is normal in caliber with mild atherosclerotic calcification. No enlarged abdominal or pelvic lymph nodes.   Reproductive: Limited evaluation due to streak artifact from right hip orthopedic hardware. No gross abnormality.   Other: Similar fat containing periumbilical hernia without evidence of inflammatory change. No significant abdominopelvic ascites. No intraperitoneal free air.   Musculoskeletal: Diffuse osseous demineralization. No acute osseous abnormality. No aggressive lytic or blastic osseous lesion. Intact right hip arthroplasty. Multilevel degenerative changes of the spine.   IMPRESSION: 1. No acute localizing findings in the abdomen or pelvis. 2. Partially distended  gallbladder with sludge. No evidence of acute cholecystitis. 3. Additional unchanged chronic findings, as described above.     Electronically Signed   By: Hart Robinsons M.D.   On: 04/26/2023 11:49    ASSESSMENT/PLAN    Acute exacerbation of Asthma - due to coronavirus               -CRP trend, narrow abx to doxy bid , steoids with IV solumedrol and prednisone taper as current, continue montelukast and nebulizer therapy Acute on chronic hypoxemic respiratory failure - due to viral pneumonia Type 2 NSTEMI - cardiology on case appreciate input 4. Bibasilar atelectasis - incentive spirometry sq1h     Thank you for allowing me to participate in the care of this patient.   Patient/Family are satisfied with care plan and all questions have been answered.    Provider disclosure: Patient with at least one acute or chronic illness or injury that poses a threat to life or bodily function and is being managed actively during this encounter.  All of the below services have been performed independently by signing provider:  review of prior documentation from internal and or external health records.  Review of previous and current lab results.  Interview and comprehensive assessment during patient visit today. Review of current and previous chest radiographs/CT scans. Discussion of management and test interpretation with health care team and patient/family.   This document was prepared using Conservation officer, historic buildings and may include  unintentional dictation errors.     Vida Rigger, M.D.  Division of Pulmonary & Critical Care Medicine

## 2023-04-26 NOTE — Hospital Course (Addendum)
 Taken from H&P.  Courtney Wilcox is a 78 y.o. female with medical history significant of SVT, depression, HTN, OSA, RA, hx/o PE, eosinophilic asthma presented with acute respiratory failure with hypoxia, NSTEMI, SIRS.  Patient reports worsening shortness of breath over the past 3 to 4 days.  Positive increased work of breathing.  Mild wheezing.  Mild left-sided chest pain with movement and deep breathing.   She was seen by her pulmonologist 2 days ago and was diagnosed with bronchitic flare and was placed on Z-Pak along with steroid.  Patient showed some initial improvement and then acute decompensation over the past 12 to 24 hours.  On presentation patient was febrile at 100.2, mildly tachycardic, initially requiring BiPAP, labs with creatinine of 1.72, platelet 154, UA unremarkable, elevated troponin at 186>>317.  Respiratory panel negative. CT head was without any acute abnormality.  CTA chest was negative for PE or any other acute cardiopulmonary process.  Cardiology was also consulted due to elevated troponin, started on heparin infusion.  No concern of NSTEMI or angina, likely demand supply mismatch ischemia.  Patient had a known CAD with prior stent placement to proximal LAD September 2024.  They are recommending heparin for 48 hours and no other ischemic workup.  4/10: Afebrile with stable vitals, saturating 99% on 3 L of oxygen.  Extended respiratory panel positive for coronavirus NL 63, preliminary blood cultures negative in 1 day.  CBC with decrease of hemoglobin to 9.2, mildly decreased all cell lines.  CO2 of 20, BUN 32, creatinine 1.54.  Significantly elevated procalcitonin at 75.13.  Repeating procalcitonin even further increased to more than 150.  CT abdomen was obtained and it was also without any acute abnormality.  She was initially started on Zosyn, later pulmonary de-escalated to doxycycline.  Patient also has CKD, viral infection and rheumatoid arthritis which can cause increase  in procalcitonin.  CRP was ordered Echocardiogram pending

## 2023-04-26 NOTE — Progress Notes (Signed)
 PHARMACY - ANTICOAGULATION CONSULT NOTE  Pharmacy Consult for Heparin  Indication: chest pain/ACS  Allergies  Allergen Reactions   Amiodarone Other (See Comments)    Fatigue, Nausea, anorexia, malaise   Lactose Other (See Comments) and Diarrhea   Other Other (See Comments)    Pt ONLY tolerates SLOW-FE (slow released iron)---upsets IBS   Biaxin [Clarithromycin] Other (See Comments)    GI upset    Hydrocodone-Acetaminophen Itching    Tolerates acetaminophen    Oxycodone Itching   Sulfa Antibiotics Other (See Comments) and Diarrhea    GI upset  GI upset GI upset    Sulfasalazine Diarrhea    Patient Measurements: Height: 5\' 4"  (162.6 cm) Weight: 87.9 kg (193 lb 12.6 oz) IBW/kg (Calculated) : 54.7 HEPARIN DW (KG): 74.2  Vital Signs: Temp: 98.2 F (36.8 C) (04/10 0208) Temp Source: Oral (04/10 0208) BP: 128/62 (04/10 0400) Pulse Rate: 72 (04/10 0400)  Labs: Recent Labs    04/24/23 2341 04/25/23 0030 04/25/23 0209 04/25/23 0956 04/25/23 1109 04/25/23 1201 04/25/23 2106 04/26/23 0508  HGB 11.8*  --   --   --   --   --   --  9.2*  HCT 36.2  --   --   --   --   --   --  28.7*  PLT 154  --   --   --   --   --   --  113*  HEPARINUNFRC  --   --   --   --  0.27*  --  0.20* 0.41  CREATININE 1.72*  --   --   --   --   --   --  1.54*  TROPONINIHS  --    < > 317* 351*  --  294*  --   --    < > = values in this interval not displayed.    Estimated Creatinine Clearance: 33.9 mL/min (A) (by C-G formula based on SCr of 1.54 mg/dL (H)).   Medical History: Past Medical History:  Diagnosis Date   Allergic rhinitis    Anxiety    Arthritis    Asthma    Atrophic vaginitis    B12 deficiency    Bronchitis, chronic (HCC)    CAD (coronary artery disease)    a. 03/2003 Cath: Nl cors; b. 09/2022 Staged PCI: LM nl, LAD 85p/43m (3.5x26 Onyx Frontier DES), RI small, mild dzs, LCX nl, OM2 mild dzs, RCA 30p.   Cervical disc disease    CKD (chronic kidney disease), stage III (HCC)     Collagen vascular disease (HCC)    RA   Cough - post-COVID    Depression    Diastolic dysfunction    a. 11/2020 Echo: EF 45-50%, GrI DD; b. 10/2021 Echo: EF 60-65%, GrII DD; c. 09/2022 Echo: EF 55-60%, no rwma, GrI DD, nl RV fxn, mildly dil LA. No significant valvular dzs.   Fibrocystic breast disease    Foot ulcer (HCC)    GERD (gastroesophageal reflux disease)    Hammer toe    Hyperlipidemia    IBS (irritable bowel syndrome)    Macrocytic anemia    Macular degeneration 2023   Obesity    OSA (obstructive sleep apnea)    Osteoarthrosis    Multi site   Osteopenia    PSVT (paroxysmal supraventricular tachycardia) (HCC)    a. 04/2022 Zio: Sinus rhythm @ 57 (42-101). Rare PACs/PVCs. 16109 SVT runs lasting up to 36m 12sec - max rate 222. 5 beats NSVT. Triggered  events = sinus, SVT, PACs.   Rheumatoid arthritis (HCC)    Shingles    Thyroid nodule    MULTIPLE   Tonsillitis    recurrent   Umbilical hernia     Medications:  (Not in a hospital admission)   Assessment: Pharmacy consulted to heparin in this 78 year old female admitted with ACS/NSTEMI. No prior anticoag noted. CrCl = 30.3 ml/min   Date/Time HL Rate  Comment 4/9 1109 0.27 900 units/hr Subtherapeutic  4/9 2109 0.20 1050 units/hr SUBtherapeutic 4/10 0508        0.41    1250 units/hr   Therapeutic X 1   Goal of Therapy:  Heparin level 0.3-0.7 units/ml Monitor platelets by anticoagulation protocol: Yes   Plan:  4/10: HL @ 0508 = 0.41, therapeutic X 1 - Will continue pt on current rate and recheck HL in 8hrs.  - Monitor CBC daily while on heparin  Thank you for involving pharmacy in this patient's care.   Venezia Sargeant D Clinical Pharmacist 04/26/2023 6:04 AM

## 2023-04-26 NOTE — TOC Initial Note (Signed)
 Transition of Care [facility]) - Initial/Assessment Note    Patient Details  Name: Courtney Wilcox MRN: [id number] Date of Birth: [birth year]  Transition of Care [facility]) CM/SW Contact:    Colin Broach, LCSW Phone Number: 04/26/2023, 6:05 PM  Clinical Narrative:  CSW met with patient to complete Readmission Prevention Assessment.  CSW introduced self and reason for visit.  Patient's daughter, Courtney Wilcox, at bedside.  Patient's PCP is Maurine Minister, MD and [facility] [address].  Patient lives with Courtney Wilcox.  She recently lost her husband approximately two weeks ago. Patient states that she has a rollator at home that she uses to get around.  At discharge, Courtney Wilcox will transport her home.  TOC to continue to follow for any discharge needs that may arise.                       Patient Goals and CMS Choice            Expected Discharge Plan and Services                                              Prior Living Arrangements/Services                       Activities of Daily Living   ADL Screening (condition at time of admission) Independently performs ADLs?: Yes (appropriate for developmental age) Is the patient deaf or have difficulty hearing?: No Does the patient have difficulty seeing, even when wearing glasses/contacts?: Yes Does the patient have difficulty concentrating, remembering, or making decisions?: No  Permission Sought/Granted                  Emotional Assessment              Admission diagnosis:  Acute respiratory failure with hypoxia (HCC) [J96.01] Patient Active Problem List   Diagnosis Date Noted   Acute respiratory failure with hypoxia (HCC) 04/25/2023   NSTEMI (non-ST elevated myocardial infarction) (HCC) 04/25/2023   SIRS (systemic inflammatory response syndrome) (HCC) 04/25/2023   Respiratory infection 04/25/2023   Sepsis (HCC) 04/25/2023   Coronary artery disease involving native coronary artery of  native heart 09/30/2022   Class 1 obesity due to excess calories with body mass index (BMI) of 34.0 to 34.9 in adult 09/28/2022   Unstable angina (HCC) 09/28/2022   Coronary artery disease due to calcified coronary lesion 09/28/2022   Chronic kidney disease, stage 3b (HCC) 09/28/2022   GERD without esophagitis 09/28/2022   History of pulmonary embolism 01/20/2022   Anemia in CKD (chronic kidney disease) 12/09/2021   LVH (left ventricular hypertrophy) due to hypertensive disease, without heart failure 09/15/2021   SVT (supraventricular tachycardia) (HCC) 11/19/2020   History of atrial flutter 11/19/2020   Chronic venous insufficiency 08/15/2020   Carotid stenosis 02/04/2020   Hyperlipidemia LDL goal <70 02/04/2020   Multiple thyroid nodules 05/21/2018   Thrombocytopenia (HCC) 07/31/2017   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/2017   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   Primary osteoarthritis of right hip 12/13/2016   S/P total knee arthroplasty 07/12/2016   Anemia of chronic disease 10/30/2013   Anxiety 10/30/2013   Osteoarthritis 07/08/2013   Osteopenia 07/08/2013   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 [place] [address] Phone: [phone] Fax: [phone]     Social Drivers of Health (SDOH) Social History: SDOH Screenings   Food Insecurity: No Food Insecurity (04/25/2023)  Housing: Low Risk  (04/25/2023)  Transportation Needs: No Transportation Needs (04/25/2023)  Utilities: Not At Risk (04/25/2023)  Financial Resource Strain: Low Risk  (03/14/2023)   Received from [facility]  Social Connections: Moderately Isolated (04/25/2023)  Tobacco Use: Low Risk   (04/25/2023)   SDOH Interventions:     Readmission Risk Interventions     No data to display

## 2023-04-26 NOTE — Progress Notes (Signed)
 PT Cancellation Note  Patient Details Name: Courtney Wilcox MRN: [id number] DOB: 31-Jul-1945   Cancelled Treatment:    Reason Eval/Treat Not Completed: Fatigue/lethargy limiting ability to participate (Consult received and chart reviewed.  Patient sleeping soundly upon arrival.  Daughter at bedside requests therapist allow patient to rest ("It's been a busy day") and re-attempt at later time/date as able. Will continue efforts as appropriate.)  Murl Zogg H. Manson Passey, PT, DPT, NCS 04/26/23, 3:44 PM [phone]

## 2023-04-26 NOTE — Progress Notes (Addendum)
 PHARMACY - ANTICOAGULATION CONSULT NOTE  Pharmacy Consult for Heparin  Indication: chest pain/ACS  Patient Measurements: Height: 5\' 4"  (162.6 cm) Weight: 87.9 kg (193 lb 12.6 oz) IBW/kg (Calculated) : 54.7 HEPARIN DW (KG): 74.2  Labs: Recent Labs    04/24/23 2341 04/25/23 0030 04/25/23 0209 04/25/23 0956 04/25/23 1109 04/25/23 1201 04/25/23 2106 04/26/23 0508 04/26/23 1300  HGB 11.8*  --   --   --   --   --   --  9.2*  --   HCT 36.2  --   --   --   --   --   --  28.7*  --   PLT 154  --   --   --   --   --   --  113*  --   HEPARINUNFRC  --   --   --   --    < >  --  0.20* 0.41 0.37  CREATININE 1.72*  --   --   --   --   --   --  1.54*  --   TROPONINIHS  --    < > 317* 351*  --  294*  --   --   --    < > = values in this interval not displayed.    Estimated Creatinine Clearance: 33.9 mL/min (A) (by C-G formula based on SCr of 1.54 mg/dL (H)).   Medical History: Past Medical History:  Diagnosis Date   Allergic rhinitis    Anxiety    Arthritis    Asthma    Atrophic vaginitis    B12 deficiency    Bronchitis, chronic (HCC)    CAD (coronary artery disease)    a. 03/2003 Cath: Nl cors; b. 09/2022 Staged PCI: LM nl, LAD 85p/57m (3.5x26 Onyx Frontier DES), RI small, mild dzs, LCX nl, OM2 mild dzs, RCA 30p.   Cervical disc disease    CKD (chronic kidney disease), stage III (HCC)    Collagen vascular disease (HCC)    RA   Cough - post-COVID    Depression    Diastolic dysfunction    a. 11/2020 Echo: EF 45-50%, GrI DD; b. 10/2021 Echo: EF 60-65%, GrII DD; c. 09/2022 Echo: EF 55-60%, no rwma, GrI DD, nl RV fxn, mildly dil LA. No significant valvular dzs.   Fibrocystic breast disease    Foot ulcer (HCC)    GERD (gastroesophageal reflux disease)    Hammer toe    Hyperlipidemia    IBS (irritable bowel syndrome)    Macrocytic anemia    Macular degeneration 2023   Obesity    OSA (obstructive sleep apnea)    Osteoarthrosis    Multi site   Osteopenia    PSVT (paroxysmal  supraventricular tachycardia) (HCC)    a. 04/2022 Zio: Sinus rhythm @ 57 (42-101). Rare PACs/PVCs. 11914 SVT runs lasting up to 48m 12sec - max rate 222. 5 beats NSVT. Triggered events = sinus, SVT, PACs.   Rheumatoid arthritis (HCC)    Shingles    Thyroid nodule    MULTIPLE   Tonsillitis    recurrent   Umbilical hernia    Assessment: Pharmacy consulted to heparin in this 78 year old female admitted with ACS/NSTEMI. No prior anticoag noted.  Date/Time HL Rate  Comment 4/9 1109 0.27 900 units/hr Subtherapeutic  4/9 2109 0.20 1050 units/hr SUBtherapeutic 4/10 0508        0.41    1250 units/hr   Therapeutic x 1 4/10 1300 0.37 1250 units/hr  Therapeutic x 2  Goal of Therapy:  Heparin level 0.3-0.7 units/ml Monitor platelets by anticoagulation protocol: Yes   Plan:  --Heparin level is therapeutic x 2 --Continue heparin infusion at 1250 units/hr --HL and CBC tomorrow AM  Thank you for involving pharmacy in this patient's care.   Tressie Ellis 04/26/2023 1:50 PM

## 2023-04-26 NOTE — ED Notes (Signed)
 Pt unable to sleep d/t restless leg syndrome. Pt reports she takes 300mg  gabapentin at home for this. Messaged on-call provider via epic chat for orders, awaiting response. Pt provided hot packs per request for legs

## 2023-04-26 NOTE — Progress Notes (Signed)
 Progress Note   Patient: Courtney Wilcox HQI:[id number] DOB: December 17, 1945 DOA: 04/24/2023     1 DOS: the patient was seen and examined on 04/26/2023   Brief hospital course: Taken from H&P.  Courtney Wilcox is a 78 y.o. female with medical history significant of SVT, depression, HTN, OSA, RA, hx/o PE, eosinophilic asthma presented with acute respiratory failure with hypoxia, NSTEMI, SIRS.  Patient reports worsening shortness of breath over the past 3 to 4 days.  Positive increased work of breathing.  Mild wheezing.  Mild left-sided chest pain with movement and deep breathing.   She was seen by her pulmonologist 2 days ago and was diagnosed with bronchitic flare and was placed on Z-Pak along with steroid.  Patient showed some initial improvement and then acute decompensation over the past 12 to 24 hours.  On presentation patient was febrile at 100.2, mildly tachycardic, initially requiring BiPAP, labs with creatinine of 1.72, platelet 154, UA unremarkable, elevated troponin at 186>>317.  Respiratory panel negative. CT head was without any acute abnormality.  CTA chest was negative for PE or any other acute cardiopulmonary process.  Cardiology was also consulted due to elevated troponin, started on heparin infusion.  No concern of NSTEMI or angina, likely demand supply mismatch ischemia.  Patient had a known CAD with prior stent placement to proximal LAD September 2024.  They are recommending heparin for 48 hours and no other ischemic workup.  4/10: Afebrile with stable vitals, saturating 99% on 3 L of oxygen.  Extended respiratory panel positive for coronavirus NL 63, preliminary blood cultures negative in 1 day.  CBC with decrease of hemoglobin to 9.2, mildly decreased all cell lines.  CO2 of 20, BUN 32, creatinine 1.54.  Significantly elevated procalcitonin at 75.13.  Repeating procalcitonin even further increased to more than 150.  CT abdomen was obtained and it was also without any acute  abnormality.  She was initially started on Zosyn, later pulmonary de-escalated to doxycycline.  Patient also has CKD, viral infection and rheumatoid arthritis which can cause increase in procalcitonin.  CRP was ordered Echocardiogram pending  Assessment and Plan: * Acute respiratory failure with hypoxia (HCC) Decompensated respiratory status requiring BiPAP in the setting of baseline eosinophilic asthma with concern for bronchitis-failed outpatient regimen CTA of the chest negative for PE and otherwise grossly stable No overt signs of volume overload  Extended respiratory viral panel positive for coronavirus NL 63 Significantly elevated procalcitonin at 75>>150 CT abdomen was also obtained which was negative for any significant abnormality or concern of infection Dr. Karna Christmas with pulmonology consulted IV Solu-Medrol and Xopenex Pulmonary started her on doxycycline. - Continue with supportive care - Currently at 3 L of oxygen with no baseline use   NSTEMI (non-ST elevated myocardial infarction) (HCC) Troponin 180s to 310s in setting of decompensated respiratory failure requiring BiPAP Noted baseline history of coronary disease status post stent placement September 2024 Started on heparin drip in the ER Suspect demand mismatch in the setting of acute respiratory failure NSTEMI ruled out-cardiology is on board - Continue heparin infusion for 48 hours-no further cardiac workup needed -Continue home regimen including aspirin and statin  SIRS (systemic inflammatory response syndrome) (HCC) Meeting SIRS criteria with Tmax 100.9, heart rate 100s White count 6 Lactate 1.4 Noted decompensated respiratory failure requiring BiPAP in the setting of eosinophilic asthma CTA negative for any overt infection COVID flu and RSV negative, RVP with coronavirus, preliminary blood cultures negative and significantly elevated procalcitonin -Supportive care   Rheumatoid arthritis (  HCC) Continue  leflunomide, Plaquenil, steroid  May need taper given recent stress dose steroid use   GERD without esophagitis PPI   Chronic kidney disease, stage 3b (HCC) Cr 1.7 w/ GFR in the 30s  Creatinine improved to 1.54 today Near baseline  Monitor    History of pulmonary embolism CTA today negative for PE  On heparin gtt in setting of NSTEMI    SVT (supraventricular tachycardia) (HCC) Baseline hx/o SVT and atrial flutter followed by EP  HR currently in 60s , some initial concern of transient A-fib CHADS2VASc at least 6  Cont diltiazem  Cardiology is recommending starting on anticoagulation after finishing heparin infusion.   Subjective: Patient was seen and examined today.  No chest pain, she was having mild left upper quadrant discomfort and mild nausea.  P.o. intake remained poor.  No vomiting.  No right upper quadrant pain or discomfort.  Physical Exam: Vitals:   04/26/23 0918 04/26/23 0930 04/26/23 0938 04/26/23 1319  BP:  139/70 139/70 136/82  Pulse:  60  74  Resp:  20  20  Temp: 97.6 F (36.4 C)   98.4 F (36.9 C)  TempSrc: Oral   Oral  SpO2:  100%  99%  Weight:      Height:       General.  Obese elderly lady, in no acute distress. Pulmonary.  Lungs clear bilaterally, normal respiratory effort. CV.  Regular rate and rhythm, no JVD, rub or murmur. Abdomen.  Soft, nontender, nondistended, BS positive. CNS.  Alert and oriented .  No focal neurologic deficit. Extremities.  No edema, no cyanosis, pulses intact and symmetrical. Psychiatry.  Judgment and insight appears normal.   Data Reviewed: Prior data reviewed  Family Communication: Discussed with daughter at bedside  Disposition: Status is: Inpatient Remains inpatient appropriate because: Severity of illness  Planned Discharge Destination: Home with Home Health  DVT prophylaxis.  Heparin infusion Time spent: 50 minutes  This record has been created using Conservation officer, historic buildings. Errors have been  sought and corrected,but may not always be located. Such creation errors do not reflect on the standard of care.   Author: Arnetha Courser, MD 04/26/2023 1:22 PM  For on call review [email or web address].

## 2023-04-26 NOTE — Evaluation (Signed)
 Occupational Therapy Evaluation Patient Details Name: Courtney Wilcox MRN: [id number] DOB: 05-Jan-1946 Today's Date: 04/26/2023   History of Present Illness   78 y.o. female presented with acute respiratory failure with hypoxia, NSTEMI, SIRS. PMH of SVT, depression, HTN, OSA, RA, hx/o PE, eosinophilic asthma, R THA, bil TKAs,  CAD status post PCI to the ostial/proximal LAD (09/2022)     Clinical Impressions Pt was seen for OT evaluation this date. PTA, pt was living with her daughter where she was MOD I using a rollator for mobility and ADL performance. Ambulates household distances and community distances with frequent rest breaks, has to push cart in grocery store.  Pt presents to acute OT demonstrating impaired ADL performance and functional mobility 2/2 weakness and low activity tolerance. Pt currently requires Min A for bed mobility. SUP for seated LB dressing at EOB to don socks. CGA for STS and ambulation ~30-35 feet using RW to bathroom. CGA for toilet transfer, cueing needed for direction d/t vision deficits from macular degeneration. CGA for standing peri-care and hand hygiene at sink. HR up to upper 130's with mobility and sp02 stable on  RA.  Pt would benefit from skilled OT services to address noted impairments and functional limitations to maximize safety and independence while minimizing falls risk and caregiver burden. Do anticipate the need for follow up OT services upon acute hospital DC.      If plan is discharge home, recommend the following:   A little help with walking and/or transfers;A little help with bathing/dressing/bathroom;Assistance with cooking/housework;Help with stairs or ramp for entrance;Assist for transportation     Functional Status Assessment   Patient has had a recent decline in their functional status and demonstrates the ability to make significant improvements in function in a reasonable and predictable amount of time.     Equipment  Recommendations   None recommended by OT     Recommendations for Other Services         Precautions/Restrictions   Precautions Precautions: Fall Restrictions Weight Bearing Restrictions Per Provider Order: No     Mobility Bed Mobility Overal bed mobility: Needs Assistance Bed Mobility: Supine to Sit, Sit to Supine     Supine to sit: Min assist, HOB elevated Sit to supine: Contact guard assist   General bed mobility comments: increased time/cueing    Transfers Overall transfer level: Needs assistance Equipment used: Rolling walker (2 wheels) Transfers: Sit to/from Stand Sit to Stand: Contact guard assist           General transfer comment: ambulated to the bathroom across the hall and back using RW with CGA; sp02 stable on RA and HR up to 137 at most during session      Balance Overall balance assessment: Needs assistance Sitting-balance support: Feet supported Sitting balance-Leahy Scale: Good     Standing balance support: Bilateral upper extremity supported, Reliant on assistive device for balance Standing balance-Leahy Scale: Fair                             ADL either performed or assessed with clinical judgement   ADL Overall ADL's : Needs assistance/impaired                     Lower Body Dressing: Supervision/safety;Sitting/lateral leans Lower Body Dressing Details (indicate cue type and reason): to don socks Toilet Transfer: Contact guard assist;Rolling walker (2 wheels)   Toileting- Clothing Manipulation and Hygiene: Supervision/safety;Sit to/from stand;Contact  guard assist       Functional mobility during ADLs: Contact guard assist;Rolling walker (2 wheels)       Vision         Perception         Praxis         Pertinent Vitals/Pain Pain Assessment Pain Assessment: 0-10 Pain Score: 5  Pain Location: chest Pain Intervention(s): Monitored during session, Repositioned     Extremity/Trunk Assessment  Upper Extremity Assessment Upper Extremity Assessment: Overall WFL for tasks assessed   Lower Extremity Assessment Lower Extremity Assessment: Generalized weakness       Communication Communication Communication: No apparent difficulties   Cognition Arousal: Alert Behavior During Therapy: WFL for tasks assessed/performed Cognition: No apparent impairments                               Following commands: Intact       Cueing  General Comments      HR up to 137 during session-pt with tachycardia at baseline; sp02 stable on RA and notified nurse that 02 removed   Exercises Other Exercises Other Exercises: Edu on role of OT in acute setting.   Shoulder Instructions      Home Living Family/patient expects to be discharged to:: Private residence Living Arrangements: Children (daughter)   Type of Home: House Home Access: Stairs to enter Secretary/administrator of Steps: 3 Entrance Stairs-Rails: None Home Layout: One level     Bathroom Shower/Tub: Chief Strategy Officer: Standard Bathroom Accessibility: Yes   Home Equipment: Agricultural consultant (2 wheels);Rollator (4 wheels);Shower seat;Grab bars - tub/shower;Grab bars - toilet          Prior Functioning/Environment Prior Level of Function : Independent/Modified Independent             Mobility Comments: IND, no AD use; pushes cart through grocery store; takes rest breaks d/t medical conditions ADLs Comments: IND    OT Problem List: Decreased strength;Decreased activity tolerance;Impaired balance (sitting and/or standing)   OT Treatment/Interventions: Self-care/ADL training;Balance training;Therapeutic exercise;Therapeutic activities;Energy conservation;DME and/or AE instruction;Patient/family education      OT Goals(Current goals can be found in the care plan section)   Acute Rehab OT Goals Patient Stated Goal: return home OT Goal Formulation: With patient/family Time For Goal  Achievement: 05/10/23 Potential to Achieve Goals: Good ADL Goals Pt Will Perform Lower Body Bathing: with modified independence;sit to/from stand;sitting/lateral leans Pt Will Perform Lower Body Dressing: with modified independence;sit to/from stand;sitting/lateral leans Pt Will Transfer to Toilet: with modified independence;ambulating;regular height toilet;with supervision Pt Will Perform Toileting - Clothing Manipulation and hygiene: with modified independence;sitting/lateral leans;sit to/from stand Additional ADL Goal #1: Pt will demo implementation of 1 learned ECS to utilize during ADL performance 2/2 trials to prevent overexertion.   OT Frequency:  Min 2X/week    Co-evaluation              AM-PAC OT "6 Clicks" Daily Activity     Outcome Measure Help from another person eating meals?: None Help from another person taking care of personal grooming?: None Help from another person toileting, which includes using toliet, bedpan, or urinal?: A Little Help from another person bathing (including washing, rinsing, drying)?: A Little Help from another person to put on and taking off regular upper body clothing?: None Help from another person to put on and taking off regular lower body clothing?: A Little 6 Click Score: 21   End  of Session Equipment Utilized During Treatment: Rolling walker (2 wheels) Nurse Communication: Mobility status  Activity Tolerance: Patient tolerated treatment well Patient left: in bed;with call bell/phone within reach;with bed alarm set;with family/visitor present  OT Visit Diagnosis: Other abnormalities of gait and mobility (R26.89)                Time: 1610-9604 OT Time Calculation (min): 38 min Charges:  OT General Charges $OT Visit: 1 Visit OT Evaluation $OT Eval Moderate Complexity: 1 Mod OT Treatments $Self Care/Home Management : 23-37 mins Less Woolsey, OTR/L 04/26/23, 12:54 PM  Rashawn Rolon E Sylwia Cuervo 04/26/2023, 12:48 PM

## 2023-04-26 NOTE — ED Notes (Signed)
 Per pharmacist, on call provider was contacted and does not want pt to have dose of cardizem tonight d/t BP

## 2023-04-27 DIAGNOSIS — R6510 Systemic inflammatory response syndrome (SIRS) of non-infectious origin without acute organ dysfunction: Secondary | ICD-10-CM | POA: Diagnosis not present

## 2023-04-27 DIAGNOSIS — J9601 Acute respiratory failure with hypoxia: Secondary | ICD-10-CM | POA: Diagnosis not present

## 2023-04-27 DIAGNOSIS — M069 Rheumatoid arthritis, unspecified: Secondary | ICD-10-CM | POA: Diagnosis not present

## 2023-04-27 DIAGNOSIS — J988 Other specified respiratory disorders: Secondary | ICD-10-CM | POA: Diagnosis not present

## 2023-04-27 LAB — PROCALCITONIN: Procalcitonin: 117.55 ng/mL

## 2023-04-27 LAB — CBC
HCT: 28.5 % — ABNORMAL LOW (ref 36.0–46.0)
Hemoglobin: 9.1 g/dL — ABNORMAL LOW (ref 12.0–15.0)
MCH: 32.5 pg (ref 26.0–34.0)
MCHC: 31.9 g/dL (ref 30.0–36.0)
MCV: 101.8 fL — ABNORMAL HIGH (ref 80.0–100.0)
Platelets: 124 10*3/uL — ABNORMAL LOW (ref 150–400)
RBC: 2.8 MIL/uL — ABNORMAL LOW (ref 3.87–5.11)
RDW: 14.2 % (ref 11.5–15.5)
WBC: 8.3 10*3/uL (ref 4.0–10.5)
nRBC: 0 % (ref 0.0–0.2)

## 2023-04-27 LAB — BASIC METABOLIC PANEL WITH GFR
Anion gap: 8 (ref 5–15)
BUN: 31 mg/dL — ABNORMAL HIGH (ref 8–23)
CO2: 19 mmol/L — ABNORMAL LOW (ref 22–32)
Calcium: 8.6 mg/dL — ABNORMAL LOW (ref 8.9–10.3)
Chloride: 112 mmol/L — ABNORMAL HIGH (ref 98–111)
Creatinine, Ser: 1.52 mg/dL — ABNORMAL HIGH (ref 0.44–1.00)
GFR, Estimated: 36 mL/min — ABNORMAL LOW (ref >60)
Glucose, Bld: 140 mg/dL — ABNORMAL HIGH (ref 70–99)
Potassium: 3.8 mmol/L (ref 3.5–5.1)
Sodium: 139 mmol/L (ref 135–145)

## 2023-04-27 LAB — C-REACTIVE PROTEIN: CRP: 8.1 mg/dL — ABNORMAL HIGH (ref <1.0)

## 2023-04-27 LAB — HEPARIN LEVEL (UNFRACTIONATED): Heparin Unfractionated: 0.31 [IU]/mL (ref 0.30–0.70)

## 2023-04-27 MED ORDER — DOXYCYCLINE HYCLATE 100 MG PO TABS: 100.0000 mg | ORAL_TABLET | Freq: Two times a day (BID) | ORAL | 0 refills | Status: AC

## 2023-04-27 MED ORDER — SODIUM BICARBONATE 650 MG PO TABS
650.0000 mg | ORAL_TABLET | Freq: Two times a day (BID) | ORAL | Status: DC
  Administered 2023-04-27: 650 mg via ORAL
  Filled 2023-04-27: qty 1

## 2023-04-27 MED ORDER — PREDNISONE 20 MG PO TABS: 40.0000 mg | ORAL_TABLET | Freq: Every day | ORAL | 0 refills | Status: AC

## 2023-04-27 MED ORDER — SODIUM BICARBONATE 650 MG PO TABS: 650.0000 mg | ORAL_TABLET | Freq: Two times a day (BID) | ORAL | 1 refills | Status: DC

## 2023-04-27 MED ORDER — PREDNISONE 2.5 MG PO TABS: 5.0000 mg | ORAL_TABLET | Freq: Every day | ORAL | Status: DC

## 2023-04-27 NOTE — Evaluation (Signed)
 Physical Therapy Evaluation Patient Details Name: Courtney Wilcox MRN: [id number] DOB: July 11, 1945 Today's Date: 04/27/2023  History of Present Illness  78 y.o. female presented with acute respiratory failure with hypoxia, NSTEMI, SIRS. PMH of SVT, depression, HTN, OSA, RA, hx/o PE, eosinophilic asthma, R THA, bil TKAs,  CAD status post PCI to the ostial/proximal LAD (09/2022)  Clinical Impression  Patient resting in bed upon arrival to room; sleeping, but easily awakens to voice.  Oriented to basic information, pleasant/cooperative and agreeable to participation with session.  Eager for discharge home.  Endorses chronic, arthritic pain to bilat shoulders and R > L hip; denies acute pain.  Bilat UE/LE generally weak and deconditioned, but grossly functional for basic transfers and gait.  No focal weakness appreciated.  Able to complete bed mobility with cga/min assist; sit/stand, basic transfers and gait  (100') with RW, cga.  Demonstrates broad BOS with decreased heel strike/toe off, choppy stepping pattern, but no overt buckling or LOB. Mod SOB with gait efforts-sats >90%, HR peak at 147 briefly (recovers to 90s with seated rest-MD informed/aware), BORG 5/10 with distance. Do recommend continued use of RW for optimal balance/safety and overall energy conservation; patient/daughter voiced understanding and agreement  Would benefit from skilled PT to address above deficits and promote optimal return to PLOF.; recommend post-acute PT follow up as indicated by interdisciplinary care team.            If plan is discharge home, recommend the following: A little help with walking and/or transfers;A little help with bathing/dressing/bathroom   Can travel by private vehicle        Equipment Recommendations Rolling walker (2 wheels)  Recommendations for Other Services       Functional Status Assessment Patient has had a recent decline in their functional status and demonstrates the ability to make  significant improvements in function in a reasonable and predictable amount of time.     Precautions / Restrictions Precautions Precautions: Fall Restrictions Weight Bearing Restrictions Per Provider Order: No      Mobility  Bed Mobility Overal bed mobility: Needs Assistance Bed Mobility: Supine to Sit, Sit to Supine     Supine to sit: Min assist Sit to supine: Contact guard assist        Transfers Overall transfer level: Needs assistance Equipment used: Rolling walker (2 wheels) Transfers: Sit to/from Stand Sit to Stand: Contact guard assist                Ambulation/Gait Ambulation/Gait assistance: Contact guard assist Gait Distance (Feet): 100 Feet Assistive device: Rolling walker (2 wheels)         General Gait Details: broad BOS with decreased heel strike/toe off, choppy stepping pattern, but no overt buckling or LOB.  Mod SOB with gait efforts-sats >90%, HR peak at 147 briefly (recovers to 90s with seated rest), BORG 5/10 with distance.  Do recommend continued use of RW for optimal balance/safety and overall energy conservation; patient/daughter voiced understanding and agreement  Stairs            Wheelchair Mobility     Tilt Bed    Modified Rankin (Stroke Patients Only)       Balance Overall balance assessment: Needs assistance Sitting-balance support: No upper extremity supported, Feet supported Sitting balance-Leahy Scale: Good     Standing balance support: Bilateral upper extremity supported, Reliant on assistive device for balance Standing balance-Leahy Scale: Fair  Pertinent Vitals/Pain Pain Assessment Pain Assessment: Faces Faces Pain Scale: Hurts little more Pain Location: bilat shoulders, R > L hips (baseline, chronic arthritic pain) Pain Descriptors / Indicators: Aching, Sore Pain Intervention(s): Limited activity within patient's tolerance, Monitored during session, Repositioned     Home Living Family/patient expects to be discharged to:: Private residence Living Arrangements: Children Available Help at Discharge: Family;Available 24 hours/day Type of Home: House Home Access: Stairs to enter Entrance Stairs-Rails: Right Entrance Stairs-Number of Steps: 3   Home Layout: One level Home Equipment: Agricultural consultant (2 wheels);Rollator (4 wheels);Shower seat;Grab bars - tub/shower;Grab bars - toilet      Prior Function Prior Level of Function : Independent/Modified Independent             Mobility Comments: IND for ADLs and household mobilization (intermittent use of RW at times); pushes cart through grocery store; takes rest breaks d/t medical conditions.  Does endorse single fall within previous six months       Extremity/Trunk Assessment   Upper Extremity Assessment Upper Extremity Assessment: Generalized weakness (elevation, all planes, limited by chronic arthritic changes)    Lower Extremity Assessment Lower Extremity Assessment: Generalized weakness (grossly 4-/5 throughout; no focal weakness apprecaited)       Communication        Cognition Arousal: Alert Behavior During Therapy: WFL for tasks assessed/performed   PT - Cognitive impairments: No apparent impairments                                 Cueing       General Comments      Exercises Other Exercises Other Exercises: Reviewed home safety modifications, activity pacing/energy conservation techniques, recs for RW use with all mobiltiy; patient/daughter voiced understanding and agreement.   Assessment/Plan    PT Assessment Patient needs continued PT services  PT Problem List Decreased strength;Decreased range of motion;Decreased activity tolerance;Decreased balance;Decreased mobility;Decreased coordination;Decreased knowledge of precautions;Cardiopulmonary status limiting activity;Obesity       PT Treatment Interventions DME instruction;Gait training;Stair  training;Functional mobility training;Therapeutic activities;Therapeutic exercise;Balance training;Patient/family education    PT Goals (Current goals can be found in the Care Plan section)  Acute Rehab PT Goals Patient Stated Goal: to return home PT Goal Formulation: With patient/family Time For Goal Achievement: 05/11/23 Potential to Achieve Goals: Good    Frequency Min 2X/week     Co-evaluation               AM-PAC PT "6 Clicks" Mobility  Outcome Measure Help needed turning from your back to your side while in a flat bed without using bedrails?: None Help needed moving from lying on your back to sitting on the side of a flat bed without using bedrails?: A Little Help needed moving to and from a bed to a chair (including a wheelchair)?: A Little Help needed standing up from a chair using your arms (e.g., wheelchair or bedside chair)?: A Little Help needed to walk in hospital room?: A Little Help needed climbing 3-5 steps with a railing? : A Little 6 Click Score: 19    End of Session Equipment Utilized During Treatment: Gait belt Activity Tolerance: Patient tolerated treatment well Patient left: in bed;with call bell/phone within reach;with bed alarm set;with family/visitor present Nurse Communication: Mobility status PT Visit Diagnosis: Muscle weakness (generalized) (M62.81);Difficulty in walking, not elsewhere classified (R26.2)    Time: 1610-9604 PT Time Calculation (min) (ACUTE ONLY): 26 min   Charges:  PT Evaluation $PT Eval Moderate Complexity: 1 Mod   PT General Charges $$ ACUTE PT VISIT: 1 Visit         Jeania Nater H. Manson Passey, PT, DPT, NCS 04/27/23, 9:42 PM [phone]

## 2023-04-27 NOTE — TOC Transition Note (Signed)
 Transition of Care [facility]) - Discharge Note   Patient Details  Name: Courtney Wilcox MRN: [id number] Date of Birth: 12-28-1945  [facility] CM/SW Contact:  Colin Broach, LCSW Phone Number: 04/27/2023, 3:56 PM   Clinical Narrative:    CSW discussed Home Health recommendation with patient and patient's daughter, Lyla Son.  Patient is amenable to [facility] services.  She had no preference.  CSW reached out to [facility] and they've accepted patient for PT/OT/RN).  Information relayed to patient and daughter.  DME-RW ordered through [employer] and will be delivered to the patient's home.  No other TOC needs.  CSW signing off.     Final next level of care: Home w Home Health Services Barriers to Discharge: No Barriers Identified   Patient Goals and CMS Choice   CMS Medicare.gov Compare Post Acute Care list provided to:: Patient Choice offered to / list presented to : Patient      Discharge Placement                       Discharge Plan and Services Additional resources added to the After Visit Summary for                  DME Arranged: Walker rolling DME Agency: [employer] Date DME Agency Contacted: 04/27/23   Representative spoke with at DME Agency: [facility] HH Arranged: RN, PT, OT HH Agency: [facility] Date [facility] Agency Contacted: 04/27/23   Representative spoke with at [facility] Agency: Kandee Keen  Social Drivers of Health (SDOH) Interventions SDOH Screenings   Food Insecurity: No Food Insecurity (04/25/2023)  Housing: Low Risk  (04/25/2023)  Transportation Needs: No Transportation Needs (04/25/2023)  Utilities: Not At Risk (04/25/2023)  Financial Resource Strain: Low Risk  (03/14/2023)   [facility] from [facility]  Social Connections: Moderately Isolated (04/25/2023)  Tobacco Use: Low Risk  (04/25/2023)     Readmission Risk Interventions     No data to display

## 2023-04-27 NOTE — Discharge Summary (Signed)
 Physician Discharge Summary   Patient: Courtney Wilcox MRN: [id number] DOB: 1945-03-02  Admit date:     04/24/2023  Discharge date: 04/27/23  Discharge Physician: Arnetha Courser   PCP: Patrice Paradise, MD   Recommendations at discharge:  Please obtain CBC and BMP on follow-up Follow-up with primary care provider Follow-up with pulmonology Follow-up with cardiology  Discharge Diagnoses: Principal Problem:   Acute respiratory failure with hypoxia [facility]) Active Problems:   NSTEMI (non-ST elevated myocardial infarction) (HCC)   Rheumatoid arthritis (HCC)   SIRS (systemic inflammatory response syndrome) (HCC)   SVT (supraventricular tachycardia) (HCC)   History of pulmonary embolism   Chronic kidney disease, stage 3b (HCC)   GERD without esophagitis   Respiratory infection   Sepsis [facility])   Hospital Course: Taken from H&P.  Courtney Wilcox is a 78 y.o. female with medical history significant of SVT, depression, HTN, OSA, RA, hx/o PE, eosinophilic asthma presented with acute respiratory failure with hypoxia, NSTEMI, SIRS.  Patient reports worsening shortness of breath over the past 3 to 4 days.  Positive increased work of breathing.  Mild wheezing.  Mild left-sided chest pain with movement and deep breathing.   She was seen by her pulmonologist 2 days ago and was diagnosed with bronchitic flare and was placed on Z-Pak along with steroid.  Patient showed some initial improvement and then acute decompensation over the past 12 to 24 hours.  On presentation patient was febrile at 100.2, mildly tachycardic, initially requiring BiPAP, labs with creatinine of 1.72, platelet 154, UA unremarkable, elevated troponin at 186>>317.  Respiratory panel negative. CT head was without any acute abnormality.  CTA chest was negative for PE or any other acute cardiopulmonary process.  Cardiology was also consulted due to elevated troponin, started on heparin infusion.  No concern of NSTEMI or  angina, likely demand supply mismatch ischemia.  Patient had a known CAD with prior stent placement to proximal LAD September 2024.  They are recommending heparin for 48 hours and no other ischemic workup.  4/10: Afebrile with stable vitals, saturating 99% on 3 L of oxygen.  Extended respiratory panel positive for coronavirus NL 63, preliminary blood cultures negative in 1 day.  CBC with decrease of hemoglobin to 9.2, mildly decreased all cell lines.  CO2 of 20, BUN 32, creatinine 1.54.  Significantly elevated procalcitonin at 75.13.  Repeating procalcitonin even further increased to more than 150.  CT abdomen was obtained and it was also without any acute abnormality.  She was initially started on Zosyn, later pulmonary de-escalated to doxycycline.  Patient also has CKD, viral infection and rheumatoid arthritis which can cause increase in procalcitonin.  CRP was ordered  4/11: Hemodynamically stable, able to wean back to room air.  Echocardiogram was normal.  Procalcitonin started improving.  CRP improving.  Renal function at baseline, mild non-anion gap metabolic acidosis likely due to CKD-started on bicarb tablet and she need to follow-up with her nephrologist.  CBC seems stable.  PT recommended home health which was ordered.  Patient is being discharged on 4 more days of high doses of prednisone and doxycycline as advised by her pulmonologist.  Wheezing improved.  Patient did not had any chest pain and completed a 48-hour of heparin infusion.  Likely demand ischemia.  NSTEMI ruled out.  As there was some initial concern of A-fib but later monitoring did not show any atrial fibrillation.  Cardiology is recommending holding off to any anticoagulation and a ZIO monitor was ordered which will  be mailed to her house.  She need to follow-up with her Cardiologist once done monitoring according to the instructions to discuss further results and management.  Patient will continue on current medications and  need close follow-up with her providers for further recommendations.  Assessment and Plan: * Acute respiratory failure with hypoxia (HCC) Decompensated respiratory status requiring BiPAP in the setting of baseline eosinophilic asthma with concern for bronchitis-failed outpatient regimen CTA of the chest negative for PE and otherwise grossly stable No overt signs of volume overload  Extended respiratory viral panel positive for coronavirus NL 63 Significantly elevated procalcitonin at 75>>150>>117 CT abdomen was also obtained which was negative for any significant abnormality or concern of infection Dr. Karna Christmas with pulmonology consulted Continuing steroid.  Able to wean back to room air Pulmonary started her on doxycycline. - Continue with supportive care   NSTEMI (non-ST elevated myocardial infarction) (HCC) Troponin 180s to 310s in setting of decompensated respiratory failure requiring BiPAP Noted baseline history of coronary disease status post stent placement September 2024 Started on heparin drip in the ER Suspect demand mismatch in the setting of acute respiratory failure NSTEMI ruled out-cardiology is on board Completed 48 hours of heparin ZIO monitor was ordered. Continue home meds  SIRS (systemic inflammatory response syndrome) (HCC) Meeting SIRS criteria with Tmax 100.9, heart rate 100s White count 6 Lactate 1.4 Noted decompensated respiratory failure requiring BiPAP in the setting of eosinophilic asthma exacerbation CTA negative for any overt infection or PE COVID flu and RSV negative, RVP with coronavirus, preliminary blood cultures negative and significantly elevated procalcitonin -Supportive care  Rheumatoid arthritis (HCC) Continue leflunomide, Plaquenil, steroid  May need taper given recent stress dose steroid use   GERD without esophagitis PPI   Chronic kidney disease, stage 3b (HCC) Cr 1.7 w/ GFR in the 30s  Creatinine improved to 1.52 today Near  baseline  Monitor    History of pulmonary embolism CTA today negative for PE  On heparin gtt in setting of NSTEMI    SVT (supraventricular tachycardia) (HCC) Baseline hx/o SVT and atrial flutter followed by EP  HR currently in 60s , some initial concern of transient A-fib CHADS2VASc at least 6  Cont diltiazem  Cardiology recommended ZIO monitor which was ordered-they are recommending to hold anticoagulation for now as there is no overt atrial flutter or fibrillation noted.  If they noticed anything on ZIO monitor they will start her on as outpatient.  Consultants: Cardiology.  Pulmonology Procedures performed: None  Disposition: Home health Diet recommendation:  Discharge Diet Orders (From admission, onward)     Start     Ordered   04/27/23 0000  Diet - low sodium heart healthy        04/27/23 1418           Regular diet DISCHARGE MEDICATION: Allergies as of 04/27/2023       Reactions   Amiodarone Other (See Comments)   Fatigue, Nausea, anorexia, malaise   Lactose Other (See Comments), Diarrhea   Other Other (See Comments)   Pt ONLY tolerates SLOW-FE (slow released iron)---upsets IBS   Biaxin [clarithromycin] Other (See Comments)   GI upset   Hydrocodone-acetaminophen Itching   Tolerates acetaminophen    Oxycodone Itching   Sulfa Antibiotics Other (See Comments), Diarrhea   GI upset GI upset GI upset   Sulfasalazine Diarrhea        Medication List     STOP taking these medications    aspirin 81 MG chewable tablet  atorvastatin 80 MG tablet Commonly known as: LIPITOR   azithromycin 250 MG tablet Commonly known as: ZITHROMAX       TAKE these medications    acetaminophen 500 MG tablet Commonly known as: TYLENOL Take 1,000 mg by mouth 3 (three) times daily as needed for mild pain.   ALPRAZolam 0.5 MG tablet Commonly known as: XANAX Take 1 tablet (0.5 mg total) by mouth at bedtime as needed for sleep or anxiety.   clopidogrel 75 MG  tablet Commonly known as: PLAVIX TAKE 1 TABLET BY MOUTH DAILY WITH BREAKFAST.   Cyanocobalamin 1000 MCG/ML Kit Inject as directed every 30 (thirty) days.   diltiazem 240 MG 24 hr capsule Commonly known as: CARDIZEM CD Take 1 capsule (240 mg total) by mouth daily.   doxycycline 100 MG tablet Commonly known as: VIBRA-TABS Take 1 tablet (100 mg total) by mouth every 12 (twelve) hours for 5 days.   Dupilumab 300 MG/2ML Soaj Inject 4 mLs into the skin every 14 (fourteen) days.   EPINEPHrine 0.3 mg/0.3 mL Soaj injection Commonly known as: EPI-PEN Inject 0.3 mg into the muscle as needed.   famotidine 20 MG tablet Commonly known as: PEPCID Take 20 mg by mouth daily as needed.   Fluticasone-Umeclidin-Vilant 100-62.5-25 MCG/ACT Aepb Inhale into the lungs. Inhale 1 Puff into the lungs once daily   gabapentin 300 MG capsule Commonly known as: NEURONTIN Take 300 mg by mouth at bedtime.   hydroxychloroquine 200 MG tablet Commonly known as: PLAQUENIL Take 1 tablet (200 mg total) by mouth daily.   leflunomide 10 MG tablet Commonly known as: ARAVA Take 10 mg by mouth daily.   levalbuterol 0.63 MG/3ML nebulizer solution Commonly known as: XOPENEX Take 0.63 mg by nebulization every 6 (six) hours as needed for wheezing.   meclizine 12.5 MG tablet Commonly known as: ANTIVERT PLEASE SEE ATTACHED FOR DETAILED DIRECTIONS   montelukast 10 MG tablet Commonly known as: SINGULAIR Take 10 mg by mouth daily as needed (for allergies.).   nystatin 100000 UNIT/ML suspension Commonly known as: MYCOSTATIN Take 5 mLs by mouth as needed.   pantoprazole 40 MG tablet Commonly known as: PROTONIX Take 1 tablet (40 mg total) by mouth 2 (two) times daily. What changed: when to take this   predniSONE 2.5 MG tablet Commonly known as: DELTASONE Take 2 tablets (5 mg total) by mouth daily with breakfast. Resume after finishing high doses of prednisone What changed:  how much to take additional  instructions   predniSONE 20 MG tablet Commonly known as: DELTASONE Take 2 tablets (40 mg total) by mouth daily with breakfast for 4 days. Start taking on: April 28, 2023 What changed: You were already taking a medication with the same name, and this prescription was added. Make sure you understand how and when to take each.   sertraline 25 MG tablet Commonly known as: ZOLOFT Take 50 mg by mouth daily. 50 mg total   Simethicone 180 MG Caps Take 1-2 capsules by mouth as needed.   sodium bicarbonate 650 MG tablet Take 1 tablet (650 mg total) by mouth 2 (two) times daily.   triamcinolone 55 MCG/ACT Aero nasal inhaler Commonly known as: NASACORT Place 2 sprays into the nose daily.   Vitamin D 50 MCG (2000 UT) tablet Take 2,000 Units by mouth daily.   Zenpep 25000-79000 units Cpep Generic drug: Pancrelipase (Lip-Prot-Amyl) Take by mouth. Occasionally takes with a large meal        Discharge Exam: Filed Weights   04/24/23 2304  Weight: 87.9 kg   General.  Obese elderly lady, in no acute distress. Pulmonary.  Lungs clear bilaterally, normal respiratory effort. CV.  Regular rate and rhythm, no JVD, rub or murmur. Abdomen.  Soft, nontender, nondistended, BS positive. CNS.  Alert and oriented .  No focal neurologic deficit. Extremities.  No edema, no cyanosis, pulses intact and symmetrical.  Condition at discharge: stable  The results of significant diagnostics from this hospitalization (including imaging, microbiology, ancillary and laboratory) are listed below for reference.   Imaging Studies: ECHOCARDIOGRAM COMPLETE Result Date: 04/26/2023    ECHOCARDIOGRAM REPORT   Patient Name:   Courtney Wilcox Date of Exam: 04/26/2023 Medical Rec #:  [id number]         Height:       64.0 in Accession #:    [id number]        Weight:       193.8 lb Date of Birth:  1945/11/22         BSA:          1.930 m Patient Age:    77 years          BP:           118/62 mmHg Patient Gender: F                  HR:           86 bpm. Exam Location:  [facility] Procedure: 2D Echo, Cardiac Doppler and Color Doppler (Both Spectral and Color            Flow Doppler were utilized during procedure). Indications:     Atrial Fibrillation  History:         Patient has prior history of Echocardiogram examinations, most                  recent 09/29/2022. CAD, Acute MI and Angina, Arrythmias:Atrial                  Fibrillation and Tachycardia, Signs/Symptoms:Chest Pain and                  Shortness of Breath; Risk Factors:Dyslipidemia. CKD, Pulmonary                  embolus.  Sonographer:     Mikki Harbor Referring Phys:  [id number] Antonieta Iba Diagnosing Phys: Julien Nordmann MD  Sonographer Comments: Image acquisition challenging due to respiratory motion. IMPRESSIONS  1. Left ventricular ejection fraction, by estimation, is 60 to 65%. The left ventricle has normal function. The left ventricle has no regional wall motion abnormalities. There is mild left ventricular hypertrophy. Left ventricular diastolic parameters are indeterminate.  2. Right ventricular systolic function is normal. The right ventricular size is normal. There is normal pulmonary artery systolic pressure. The estimated right ventricular systolic pressure is 23.6 mmHg.  3. The mitral valve is normal in structure. No evidence of mitral valve regurgitation. No evidence of mitral stenosis.  4. The aortic valve is normal in structure. Aortic valve regurgitation is not visualized. Aortic valve sclerosis is present, with no evidence of aortic valve stenosis.  5. The inferior vena cava is normal in size with greater than 50% respiratory variability, suggesting right atrial pressure of 3 mmHg. FINDINGS  Left Ventricle: Left ventricular ejection fraction, by estimation, is 60 to 65%. The left ventricle has normal function. The left ventricle has no regional wall motion abnormalities. Strain was performed and the global longitudinal strain is  indeterminate. The left  ventricular internal cavity size was normal in size. There is mild left ventricular hypertrophy. Left ventricular diastolic parameters are indeterminate. Right Ventricle: The right ventricular size is normal. No increase in right ventricular wall thickness. Right ventricular systolic function is normal. There is normal pulmonary artery systolic pressure. The tricuspid regurgitant velocity is 2.27 m/s, and  with an assumed right atrial pressure of 3 mmHg, the estimated right ventricular systolic pressure is 23.6 mmHg. Left Atrium: Left atrial size was normal in size. Right Atrium: Right atrial size was normal in size. Pericardium: There is no evidence of pericardial effusion. Mitral Valve: The mitral valve is normal in structure. Mild mitral annular calcification. No evidence of mitral valve regurgitation. No evidence of mitral valve stenosis. MV peak gradient, 4.0 mmHg. The mean mitral valve gradient is 2.0 mmHg. Tricuspid Valve: The tricuspid valve is normal in structure. Tricuspid valve regurgitation is mild . No evidence of tricuspid stenosis. Aortic Valve: The aortic valve is normal in structure. Aortic valve regurgitation is not visualized. Aortic valve sclerosis is present, with no evidence of aortic valve stenosis. Aortic valve mean gradient measures 5.0 mmHg. Aortic valve peak gradient measures 9.0 mmHg. Aortic valve area, by VTI measures 3.31 cm. Pulmonic Valve: The pulmonic valve was normal in structure. Pulmonic valve regurgitation is not visualized. No evidence of pulmonic stenosis. Aorta: The aortic root is normal in size and structure. Venous: The inferior vena cava is normal in size with greater than 50% respiratory variability, suggesting right atrial pressure of 3 mmHg. IAS/Shunts: No atrial level shunt detected by color flow Doppler. Additional Comments: 3D was performed not requiring image post processing on an independent workstation and was indeterminate.  LEFT VENTRICLE PLAX 2D LVIDd:          5.00 cm     Diastology LVIDs:         2.90 cm     LV e' medial:    6.31 cm/s LV PW:         1.30 cm     LV E/e' medial:  16.6 LV IVS:        1.40 cm     LV e' lateral:   11.20 cm/s LVOT diam:     2.00 cm     LV E/e' lateral: 9.4 LV SV:         114 LV SV Index:   59 LVOT Area:     3.14 cm  LV Volumes (MOD) LV vol d, MOD A2C: 52.6 ml LV vol d, MOD A4C: 59.9 ml LV vol s, MOD A2C: 24.7 ml LV vol s, MOD A4C: 20.8 ml LV SV MOD A2C:     27.9 ml LV SV MOD A4C:     59.9 ml LV SV MOD BP:      34.4 ml RIGHT VENTRICLE RV Basal diam:  3.70 cm RV Mid diam:    3.70 cm RV S prime:     10.90 cm/s LEFT ATRIUM             Index        RIGHT ATRIUM           Index LA diam:        3.70 cm 1.92 cm/m   RA Area:     21.20 cm LA Vol (A2C):   56.3 ml 29.17 ml/m  RA Volume:   62.90 ml  32.59 ml/m LA Vol (A4C):   69.0 ml 35.75 ml/m LA Biplane Vol: 65.3 ml 33.83  ml/m  AORTIC VALVE AV Area (Vmax):    3.14 cm AV Area (Vmean):   3.06 cm AV Area (VTI):     3.31 cm AV Vmax:           150.00 cm/s AV Vmean:          98.400 cm/s AV VTI:            0.345 m AV Peak Grad:      9.0 mmHg AV Mean Grad:      5.0 mmHg LVOT Vmax:         150.00 cm/s LVOT Vmean:        95.800 cm/s LVOT VTI:          0.364 m LVOT/AV VTI ratio: 1.06  AORTA Ao Root diam: 3.80 cm Ao Asc diam:  3.00 cm MITRAL VALVE                TRICUSPID VALVE MV Area (PHT): 2.88 cm     TR Peak grad:   20.6 mmHg MV Area VTI:   4.00 cm     TR Vmax:        227.00 cm/s MV Peak grad:  4.0 mmHg MV Mean grad:  2.0 mmHg     SHUNTS MV Vmax:       1.00 m/s     Systemic VTI:  0.36 m MV Vmean:      71.3 cm/s    Systemic Diam: 2.00 cm MV Decel Time: 263 msec MV E velocity: 105.00 cm/s MV A velocity: 92.20 cm/s MV E/A ratio:  1.14 Julien Nordmann MD Electronically signed by Julien Nordmann MD Signature Date/Time: 04/26/2023/5:57:28 PM    Final    CT ABDOMEN PELVIS WO CONTRAST Result Date: 04/26/2023 CLINICAL DATA:  Abdominal pain, acute, nonlocalized EXAM: CT ABDOMEN AND PELVIS WITHOUT CONTRAST  TECHNIQUE: Multidetector CT imaging of the abdomen and pelvis was performed following the standard protocol without IV contrast. RADIATION DOSE REDUCTION: This exam was performed according to the departmental dose-optimization program which includes automated exposure control, adjustment of the mA and/or kV according to patient size and/or use of iterative reconstruction technique. COMPARISON:  CT abdomen/pelvis dated June 25, 2020. FINDINGS: Lower chest: Mild bibasilar subsegmental atelectasis. Hepatobiliary: No suspicious focal hepatic lesion, within the limits of an unenhanced exam. Probable sludge in a partially distended gallbladder. No gallbladder wall thickening or pericholecystic fluid. No biliary dilatation. Pancreas: Unremarkable. No pancreatic ductal dilatation or surrounding inflammatory changes. Spleen: Normal in size without focal abnormality. Adrenals/Urinary Tract: Adrenal glands are unremarkable. No renal calculi or hydronephrosis. Evaluation of the distal ureters and bladder is limited due to streak artifact from indwelling right hip orthopedic hardware. Stomach/Bowel: Similar small hiatal hernia. Appendix appears normal. No evidence of obstruction or focal inflammatory change. Vascular/Lymphatic: The abdominal aorta is normal in caliber with mild atherosclerotic calcification. No enlarged abdominal or pelvic lymph nodes. Reproductive: Limited evaluation due to streak artifact from right hip orthopedic hardware. No gross abnormality. Other: Similar fat containing periumbilical hernia without evidence of inflammatory change. No significant abdominopelvic ascites. No intraperitoneal free air. Musculoskeletal: Diffuse osseous demineralization. No acute osseous abnormality. No aggressive lytic or blastic osseous lesion. Intact right hip arthroplasty. Multilevel degenerative changes of the spine. IMPRESSION: 1. No acute localizing findings in the abdomen or pelvis. 2. Partially distended gallbladder  with sludge. No evidence of acute cholecystitis. 3. Additional unchanged chronic findings, as described above. Electronically Signed   By: Hart Robinsons M.D.   On: 04/26/2023 11:49  CT Angio Chest PE W/Cm &/Or Wo Cm Result Date: 04/25/2023 CLINICAL DATA:  High probability pulmonary embolism, sink EXAM: CT ANGIOGRAPHY CHEST WITH CONTRAST TECHNIQUE: Multidetector CT imaging of the chest was performed using the standard protocol during bolus administration of intravenous contrast. Multiplanar CT image reconstructions and MIPs were obtained to evaluate the vascular anatomy. RADIATION DOSE REDUCTION: This exam was performed according to the departmental dose-optimization program which includes automated exposure control, adjustment of the mA and/or kV according to patient size and/or use of iterative reconstruction technique. CONTRAST:  75mL OMNIPAQUE IOHEXOL 350 MG/ML SOLN COMPARISON:  None Available. FINDINGS: Cardiovascular: Left anterior descending coronary artery stenting has been performed. Global cardiac size within normal limits. No pericardial effusion. Central pulmonary arteries are of normal caliber. No intraluminal filling defect identified to suggest acute pulmonary embolism. Mild atherosclerotic calcification within thoracic aorta. No aortic aneurysm. Mediastinum/Nodes: No enlarged mediastinal, hilar, or axillary lymph nodes. Thyroid gland, trachea, and esophagus demonstrate no significant findings. Lungs/Pleura: Lungs are clear. No pleural effusion or pneumothorax. Upper Abdomen: No acute abnormality. Musculoskeletal: Osseous structures are age-appropriate. No acute bone abnormality. Review of the MIP images confirms the above findings. IMPRESSION: 1. No evidence of acute pulmonary embolism. No acute cardiopulmonary process. 2.  Aortic Atherosclerosis (ICD10-I70.0). Electronically Signed   By: Helyn Numbers M.D.   On: 04/25/2023 04:22   CT Head Wo Contrast Result Date: 04/25/2023 CLINICAL DATA:   Mental status change, unknown cause EXAM: CT HEAD WITHOUT CONTRAST TECHNIQUE: Contiguous axial images were obtained from the base of the skull through the vertex without intravenous contrast. RADIATION DOSE REDUCTION: This exam was performed according to the departmental dose-optimization program which includes automated exposure control, adjustment of the mA and/or kV according to patient size and/or use of iterative reconstruction technique. COMPARISON:  CT head 04/04/2020 FINDINGS: Brain: Patchy and confluent areas of decreased attenuation are noted throughout the deep and periventricular white matter of the cerebral hemispheres bilaterally, compatible with chronic microvascular ischemic disease. No evidence of large-territorial acute infarction. No parenchymal hemorrhage. No mass lesion. No extra-axial collection. No mass effect or midline shift. No hydrocephalus. Basilar cisterns are patent. Vascular: No hyperdense vessel. Atherosclerotic calcifications are present within the cavernous internal carotid and vertebral arteries. Skull: No acute fracture or focal lesion. Sinuses/Orbits: Right maxillary sinus mucosal thickening. Paranasal sinuses and mastoid air cells are clear. Bilateral lens replacement. Otherwise the orbits are unremarkable. Other: None. IMPRESSION: No acute intracranial abnormality. Electronically Signed   By: Tish Frederickson M.D.   On: 04/25/2023 03:07   DG Chest Port 1 View Result Date: 04/25/2023 CLINICAL DATA:  sob recent pna EXAM: PORTABLE CHEST 1 VIEW COMPARISON:  Chest x-ray 09/27/2022, CT chest 09/27/2022 FINDINGS: The heart and mediastinal contours are unchanged. Atherosclerotic plaque. No focal consolidation. No pulmonary edema. Question trace left pleural effusion. No pneumothorax. No acute osseous abnormality.  Cervical spine surgical hardware. IMPRESSION: 1. Question trace left pleural effusion. Recommend repeat chest x-ray PA and lateral view of the chest for further evaluation.  2.  Aortic Atherosclerosis (ICD10-I70.0). Electronically Signed   By: Tish Frederickson M.D.   On: 04/25/2023 03:05    Microbiology: Results for orders placed or performed during the hospital encounter of 04/24/23  Blood Culture (routine x 2)     Status: None (Preliminary result)   Collection Time: 04/25/23 12:30 AM   Specimen: BLOOD  Result Value Ref Range Status   Specimen Description BLOOD BLOOD RIGHT ARM  Final   Special Requests   Final  BOTTLES DRAWN AEROBIC AND ANAEROBIC Blood Culture adequate volume   Culture   Final    NO GROWTH 2 DAYS Performed at [facility], [address] [place], [place] [address]    Report Status PENDING  Incomplete  Resp panel by RT-PCR (RSV, Flu A&B, Covid) Anterior Nasal Swab     Status: None   Collection Time: 04/25/23 12:30 AM   Specimen: Anterior Nasal Swab  Result Value Ref Range Status   SARS Coronavirus 2 by RT PCR NEGATIVE NEGATIVE Final    Comment: (NOTE) SARS-CoV-2 target nucleic acids are NOT DETECTED.  The SARS-CoV-2 RNA is generally detectable in upper respiratory specimens during the acute phase of infection. The lowest concentration of SARS-CoV-2 viral copies this assay can detect is 138 copies/mL. A negative result does not preclude SARS-Cov-2 infection and should not be used as the sole basis for treatment or other patient management decisions. A negative result may occur with  improper specimen collection/handling, submission of specimen other than nasopharyngeal swab, presence of viral mutation(s) within the areas targeted by this assay, and inadequate number of viral copies(<138 copies/mL). A negative result must be combined with clinical observations, patient history, and epidemiological information. The expected result is Negative.  Fact Sheet for Patients:  BloggerCourse.com  Fact Sheet for Healthcare Providers:  SeriousBroker.it  This test is no t yet  approved or cleared by the [place] FDA and  has been authorized for detection and/or diagnosis of SARS-CoV-2 by FDA under an Emergency Use Authorization (EUA). This EUA will remain  in effect (meaning this test can be used) for the duration of the COVID-19 declaration under Section 564(b)(1) of the Act, 21 U.S.C.section 360bbb-3(b)(1), unless the authorization is terminated  or revoked sooner.       Influenza A by PCR NEGATIVE NEGATIVE Final   Influenza B by PCR NEGATIVE NEGATIVE Final    Comment: (NOTE) The Xpert Xpress SARS-CoV-2/FLU/RSV plus assay is intended as an aid in the diagnosis of influenza from Nasopharyngeal swab specimens and should not be used as a sole basis for treatment. Nasal washings and aspirates are unacceptable for Xpert Xpress SARS-CoV-2/FLU/RSV testing.  Fact Sheet for Patients: BloggerCourse.com  Fact Sheet for Healthcare Providers: SeriousBroker.it  This test is not yet approved or cleared by the [place] FDA and has been authorized for detection and/or diagnosis of SARS-CoV-2 by FDA under an Emergency Use Authorization (EUA). This EUA will remain in effect (meaning this test can be used) for the duration of the COVID-19 declaration under Section 564(b)(1) of the Act, 21 U.S.C. section 360bbb-3(b)(1), unless the authorization is terminated or revoked.     Resp Syncytial Virus by PCR NEGATIVE NEGATIVE Final    Comment: (NOTE) Fact Sheet for Patients: BloggerCourse.com  Fact Sheet for Healthcare Providers: SeriousBroker.it  This test is not yet approved or cleared by the [place] FDA and has been authorized for detection and/or diagnosis of SARS-CoV-2 by FDA under an Emergency Use Authorization (EUA). This EUA will remain in effect (meaning this test can be used) for the duration of the COVID-19 declaration under Section 564(b)(1) of  the Act, 21 U.S.C. section 360bbb-3(b)(1), unless the authorization is terminated or revoked.  Performed at [facility], [address] [place], [place] [address]   Blood Culture (routine x 2)     Status: None (Preliminary result)   Collection Time: 04/25/23 12:32 AM   Specimen: BLOOD  Result Value Ref Range Status   Specimen Description BLOOD BLOOD LEFT ARM  Final   Special Requests   Final    BOTTLES DRAWN AEROBIC AND ANAEROBIC Blood Culture results may not be optimal due to an excessive volume of blood received in culture bottles   Culture   Final    NO GROWTH 2 DAYS Performed at [facility], [address] [place], [place] [address]    Report Status PENDING  Incomplete  Respiratory (~20 pathogens) panel by PCR     Status: Abnormal   Collection Time: 04/25/23 11:09 AM   Specimen: Nasopharyngeal Swab; Respiratory  Result Value Ref Range Status   Adenovirus NOT DETECTED NOT DETECTED Final   Coronavirus 229E NOT DETECTED NOT DETECTED Final    Comment: (NOTE) The Coronavirus on the Respiratory Panel, DOES NOT test for the novel  Coronavirus (2019 nCoV)    Coronavirus HKU1 NOT DETECTED NOT DETECTED Final   Coronavirus NL63 DETECTED (A) NOT DETECTED Final   Coronavirus OC43 NOT DETECTED NOT DETECTED Final   Metapneumovirus NOT DETECTED NOT DETECTED Final   Rhinovirus / Enterovirus NOT DETECTED NOT DETECTED Final   Influenza A NOT DETECTED NOT DETECTED Final   Influenza B NOT DETECTED NOT DETECTED Final   Parainfluenza Virus 1 NOT DETECTED NOT DETECTED Final   Parainfluenza Virus 2 NOT DETECTED NOT DETECTED Final   Parainfluenza Virus 3 NOT DETECTED NOT DETECTED Final   Parainfluenza Virus 4 NOT DETECTED NOT DETECTED Final   Respiratory Syncytial Virus NOT DETECTED NOT DETECTED Final   Bordetella pertussis NOT DETECTED NOT DETECTED Final   Bordetella Parapertussis NOT DETECTED NOT DETECTED Final   Chlamydophila pneumoniae NOT DETECTED NOT DETECTED Final    Mycoplasma pneumoniae NOT DETECTED NOT DETECTED Final    Comment: Performed at [facility], [address], [place], [place] [address]    Labs: CBC: Recent Labs  Lab 04/24/23 2341 04/26/23 0508 04/27/23 0507  WBC 6.0 5.8 8.3  HGB 11.8* 9.2* 9.1*  HCT 36.2 28.7* 28.5*  MCV 101.4* 99.0 101.8*  PLT 154 113* 124*   Basic Metabolic Panel: Recent Labs  Lab 04/24/23 2341 04/26/23 0508 04/27/23 0507  NA 138 139 139  K 3.6 3.8 3.8  CL 108 110 112*  CO2 20* 20* 19*  GLUCOSE 157* 134* 140*  BUN 30* 32* 31*  CREATININE 1.72* 1.54* 1.52*  CALCIUM 8.9 8.4* 8.6*   Liver Function Tests: Recent Labs  Lab 04/24/23 2341 04/26/23 0508  AST 57* 47*  ALT 33 28  ALKPHOS 141* 107  BILITOT 0.5 0.6  PROT 6.5 5.2*  ALBUMIN 3.2* 2.6*   CBG: Recent Labs  Lab 04/25/23 0012  GLUCAP 139*    Discharge time spent: greater than 30 minutes.  This record has been created using Conservation officer, historic buildings. Errors have been sought and corrected,but may not always be located. Such creation errors do not reflect on the standard of care.   Signed: Arnetha Courser, MD [facility] 04/27/2023

## 2023-04-27 NOTE — Progress Notes (Signed)
 PHARMACY - ANTICOAGULATION CONSULT NOTE  Pharmacy Consult for Heparin  Indication: chest pain/ACS  Patient Measurements: Height: 5\' 4"  (162.6 cm) Weight: 87.9 kg (193 lb 12.6 oz) IBW/kg (Calculated) : 54.7 HEPARIN DW (KG): 74.2  Labs: Recent Labs    04/24/23 2341 04/25/23 0030 04/25/23 0209 04/25/23 0956 04/25/23 1109 04/25/23 1201 04/25/23 2106 04/26/23 0508 04/26/23 1300 04/27/23 0507  HGB 11.8*  --   --   --   --   --   --  9.2*  --  9.1*  HCT 36.2  --   --   --   --   --   --  28.7*  --  28.5*  PLT 154  --   --   --   --   --   --  113*  --  124*  HEPARINUNFRC  --   --   --   --    < >  --    < > 0.41 0.37 0.31  CREATININE 1.72*  --   --   --   --   --   --  1.54*  --  1.52*  TROPONINIHS  --    < > 317* 351*  --  294*  --   --   --   --    < > = values in this interval not displayed.    Estimated Creatinine Clearance: 34.3 mL/min (A) (by C-G formula based on SCr of 1.52 mg/dL (H)).   Medical History: Past Medical History:  Diagnosis Date   Allergic rhinitis    Anxiety    Arthritis    Asthma    Atrophic vaginitis    B12 deficiency    Bronchitis, chronic (HCC)    CAD (coronary artery disease)    a. 03/2003 Cath: Nl cors; b. 09/2022 Staged PCI: LM nl, LAD 85p/58m (3.5x26 Onyx Frontier DES), RI small, mild dzs, LCX nl, OM2 mild dzs, RCA 30p.   Cervical disc disease    CKD (chronic kidney disease), stage III (HCC)    Collagen vascular disease (HCC)    RA   Cough - post-COVID    Depression    Diastolic dysfunction    a. 11/2020 Echo: EF 45-50%, GrI DD; b. 10/2021 Echo: EF 60-65%, GrII DD; c. 09/2022 Echo: EF 55-60%, no rwma, GrI DD, nl RV fxn, mildly dil LA. No significant valvular dzs.   Fibrocystic breast disease    Foot ulcer (HCC)    GERD (gastroesophageal reflux disease)    Hammer toe    Hyperlipidemia    IBS (irritable bowel syndrome)    Macrocytic anemia    Macular degeneration 2023   Obesity    OSA (obstructive sleep apnea)    Osteoarthrosis     Multi site   Osteopenia    PSVT (paroxysmal supraventricular tachycardia) (HCC)    a. 04/2022 [facility]io: Sinus rhythm @ 57 (42-101). Rare PACs/PVCs. 81191 SVT runs lasting up to 57m 12sec - max rate 222. 5 beats NSVT. Triggered events = sinus, SVT, PACs.   Rheumatoid arthritis (HCC)    Shingles    Thyroid nodule    MULTIPLE   Tonsillitis    recurrent   Umbilical hernia    Assessment: Pharmacy consulted to heparin in this 78 year old female admitted with ACS/NSTEMI. No prior anticoag noted.  Date/Time HL Rate  Comment 4/9 1109 0.27 900 units/hr Subtherapeutic  4/9 2109 0.20 1050 units/hr SUBtherapeutic 4/10 0508        0.41  1250 units/hr   Therapeutic x 1 4/10 1300 0.37 1250 units/hr   Therapeutic x 2 4/11  0507       0.31    1250 units/hr    Therapeutic X 3  Goal of Therapy:  Heparin level 0.3-0.7 units/ml Monitor platelets by anticoagulation protocol: Yes   Plan:  --Heparin level is therapeutic x 3 --Continue heparin infusion at 1250 units/hr --HL and CBC tomorrow AM  Thank you for involving pharmacy in this patient's care.   Aurilla Coulibaly D 04/27/2023 6:24 AM

## 2023-04-27 NOTE — Progress Notes (Signed)
 PULMONOLOGY         Date: 04/27/2023,   MRN# [id number] [facility]ST[facility] LABO 11-12-1945     AdmissionWeight: 87.9 kg                 CurrentWeight: 87.9 kg  Referring provider: Dr Daisey Dryer   CHIEF COMPLAINT:   Acute on chronic hypoxemic respiratory failure   HISTORY OF PRESENT ILLNESS   This is a pleasant 78 yo F with hx of Allergic atopic asthma and chronic cough as well as complex comorbid medical history as detailed below in PMH.  She is well known to me and was seen by me in clinic with reports of feeling severe fatigue, worsening cough with expectoration of phlegm and myalgias.  She was accompanied by daughter who relates patient is feeling sick and not in her usual state of health. We treated her for acute asthma exacerbation with steroids and antibiotics.  Per daughter patient continued to get worse and developed fevers while at home then declined and was altered. She ended up "slipping of her seat to floor" and EMS was notified to get emergency medical help.  She was brought to ER with viral testing showing positive test for coronavirus. She had elevationin procacitonin and abd distension with discomfort so abd/pelvic CT was done and showed GB sludge but no cholecystitis. She had elevated cardiac biomarkers and cardiology had evaluated aptietn with echo in process.  She is now interactive and is being treated medically for pneumonia. PCCM consulted for additional evaluation and management per family request.   04/29/23- patient improved and is cleared for dc home. Her viral LRTI should continue to improve over next few days.  We are familiar with patient and will folllw up on outpatient basis.   PAST MEDICAL HISTORY   Past Medical History:  Diagnosis Date   Allergic rhinitis    Anxiety    Arthritis    Asthma    Atrophic vaginitis    B12 deficiency    Bronchitis, chronic (HCC)    CAD (coronary artery disease)    a. 03/2003 Cath: Nl cors; b. 09/2022 Staged PCI: LM nl,  LAD 85p/71m (3.5x26 Onyx Frontier DES), RI small, mild dzs, LCX nl, OM2 mild dzs, RCA 30p.   Cervical disc disease    CKD (chronic kidney disease), stage III (HCC)    Collagen vascular disease (HCC)    RA   Cough - post-COVID    Depression    Diastolic dysfunction    a. 11/2020 Echo: EF 45-50%, GrI DD; b. 10/2021 Echo: EF 60-65%, GrII DD; c. 09/2022 Echo: EF 55-60%, no rwma, GrI DD, nl RV fxn, mildly dil LA. No significant valvular dzs.   Fibrocystic breast disease    Foot ulcer (HCC)    GERD (gastroesophageal reflux disease)    Hammer toe    Hyperlipidemia    IBS (irritable bowel syndrome)    Macrocytic anemia    Macular degeneration 2023   Obesity    OSA (obstructive sleep apnea)    Osteoarthrosis    Multi site   Osteopenia    PSVT (paroxysmal supraventricular tachycardia) (HCC)    a. 04/2022 Zio: Sinus rhythm @ 57 (42-101). Rare PACs/PVCs. 40981 SVT runs lasting up to 86m 12sec - max rate 222. 5 beats NSVT. Triggered events = sinus, SVT, PACs.   Rheumatoid arthritis (HCC)    Shingles    Thyroid nodule    MULTIPLE   Tonsillitis    recurrent   Umbilical  hernia      SURGICAL HISTORY   Past Surgical History:  Procedure Laterality Date   BACK SURGERY  2011   LAMINECTOMY   BREAST BIOPSY Right    neg   CERVICAL FUSION  2011, 2012   X 2   COLONOSCOPY  2005, 2015   COLONOSCOPY WITH PROPOFOL N/A 08/04/2020   Procedure: COLONOSCOPY WITH PROPOFOL;  Surgeon: Earline Mayotte, MD;  Location: [facility] ENDOSCOPY;  Service: Endoscopy;  Laterality: N/A;   CORONARY STENT INTERVENTION N/A 10/02/2022   Procedure: CORONARY STENT INTERVENTION;  Surgeon: Iran Ouch, MD;  Location: [facility] INVASIVE CV LAB;  Service: Cardiovascular;  Laterality: N/A;   ESOPHAGOGASTRODUODENOSCOPY (EGD) WITH PROPOFOL N/A 09/26/2021   Procedure: ESOPHAGOGASTRODUODENOSCOPY (EGD) WITH PROPOFOL;  Surgeon: Jaynie Collins, DO;  Location: [facility] ENDOSCOPY;  Service: Gastroenterology;  Laterality: N/A;    ESOPHAGOGASTRODUODENOSCOPY ENDOSCOPY  2005, 2013, 2015   EYE SURGERY Bilateral    Cataract Extraction with IOL    HERNIA REPAIR     JOINT REPLACEMENT     KNEE ARTHROPLASTY Left 07/12/2016   Procedure: COMPUTER ASSISTED TOTAL KNEE ARTHROPLASTY;  Surgeon: Donato Heinz, MD;  Location: [facility] ORS;  Service: Orthopedics;  Laterality: Left;   Laryngeal tear after intubation w/repair     LUMBAR DISC SURGERY  12/2009   REPLACEMENT TOTAL KNEE Right 06/2007   DR. HOOTEN, [facility]   REPLACEMENT UNICONDYLAR JOINT KNEE     DR. HOOTEN, [facility]   RIGHT/LEFT HEART CATH AND CORONARY ANGIOGRAPHY N/A 09/29/2022   Procedure: RIGHT/LEFT HEART CATH AND CORONARY ANGIOGRAPHY;  Surgeon: Iran Ouch, MD;  Location: [facility] INVASIVE CV LAB;  Service: Cardiovascular;  Laterality: N/A;   SHOULDER ARTHROSCOPY WITH ROTATOR CUFF REPAIR Right    TONSILLECTOMY     TOTAL HIP ARTHROPLASTY Right 01/31/2017   Procedure: TOTAL HIP ARTHROPLASTY;  Surgeon: Donato Heinz, MD;  Location: [facility] ORS;  Service: Orthopedics;  Laterality: Right;   TUBAL LIGATION     UPPER GI ENDOSCOPY       FAMILY HISTORY   Family History  Problem Relation Age of Onset   Asthma Mother    Breast cancer Mother 38   Emphysema Father        smoked   Lung cancer Father        smoked   Coronary artery disease Father 67     SOCIAL HISTORY   Social History   Tobacco Use   Smoking status: Never   Smokeless tobacco: Never  Vaping Use   Vaping status: Never Used  Substance Use Topics   Alcohol use: No   Drug use: No     MEDICATIONS    Home Medication:  Current Outpatient Rx   Order #: [id number] Class: Historical Med   Order #: [id number] Class: Normal   Order #: [id number] Class: Normal   Order #: [id number] Class: Historical Med   Order #: [id number] Class: Historical Med   Order #: [id number] Class: Normal   Order #: [id number] Class: Historical Med   Order #: [id number] Class: Historical Med   Order #: [id number] Class: Historical Med    Order #: [id number] Class: Historical Med   Order #: [id number] Class: Historical Med   Order #: [id number] Class: Historical Med   Order #: [id number] Class: Historical Med   Order #: [id number] Class: Historical Med   Order #: [id number] Class: Historical Med   Order #: [id number] Class: Historical Med   Order #: [id number] Class: Normal   Order #: [id number] Class: Normal   Order #: [id number] Class: Historical Med   Order #: [id number] Class: Historical Med  Order #: [id number] Class: Historical Med   Order #: [id number] Class: Historical Med   Order #: [id number] Class: Normal   Order #: [id number] Class: Normal   Order #: [id number] Class: Historical Med   Order #: [id number] Class: No Print    Current Medication:  Current Facility-Administered Medications:    budeson-glycopyrrolate-formoterol (BREZTRI) 160-9-4.8 MCG/ACT inhaler 2 puff, 2 puff, Inhalation, BID, Arnetha Courser, MD, 2 puff at 04/26/23 2210   clopidogrel (PLAVIX) tablet 75 mg, 75 mg, Oral, Q breakfast, Arnetha Courser, MD, 75 mg at 04/27/23 1051   diltiazem (CARDIZEM CD) 24 hr capsule 240 mg, 240 mg, Oral, Daily, Ames Dura L, PA-C, 240 mg at 04/27/23 1051   doxycycline (VIBRA-TABS) tablet 100 mg, 100 mg, Oral, Q12H, Karna Christmas, Adiya Selmer, MD, 100 mg at 04/27/23 1051   famotidine (PEPCID) tablet 20 mg, 20 mg, Oral, Daily PRN, Arnetha Courser, MD   gabapentin (NEURONTIN) capsule 300 mg, 300 mg, Oral, QHS, Amin, Tilman Neat, MD, 300 mg at 04/26/23 2209   leflunomide (ARAVA) tablet 10 mg, 10 mg, Oral, Daily, Arnetha Courser, MD, 10 mg at 04/27/23 1220   levalbuterol (XOPENEX) nebulizer solution 0.63 mg, 0.63 mg, Nebulization, Q6H WA, Amin, Tilman Neat, MD, 0.63 mg at 04/27/23 1051   LORazepam (ATIVAN) injection 0.5 mg, 0.5 mg, Intravenous, Q6H PRN, Floydene Flock, MD, 0.5 mg at 04/27/23 0123   montelukast (SINGULAIR) tablet 10 mg, 10 mg, Oral, Daily PRN, Arnetha Courser, MD   ondansetron (ZOFRAN) tablet 4 mg, 4 mg, Oral, Q6H PRN **OR** ondansetron (ZOFRAN) injection 4  mg, 4 mg, Intravenous, Q6H PRN, Floydene Flock, MD   pantoprazole (PROTONIX) EC tablet 40 mg, 40 mg, Oral, Daily, Arnetha Courser, MD, 40 mg at 04/27/23 1051   [COMPLETED] methylPREDNISolone sodium succinate (SOLU-MEDROL) 40 mg/mL injection 40 mg, 40 mg, Intravenous, Q12H, 40 mg at 04/26/23 0403 **FOLLOWED BY** predniSONE (DELTASONE) tablet 40 mg, 40 mg, Oral, Q breakfast, Floydene Flock, MD, 40 mg at 04/27/23 1050   sertraline (ZOLOFT) tablet 50 mg, 50 mg, Oral, Daily, Arnetha Courser, MD, 50 mg at 04/27/23 1051   sodium bicarbonate tablet 650 mg, 650 mg, Oral, BID, Arnetha Courser, MD, 650 mg at 04/27/23 1051  Current Outpatient Medications:    acetaminophen (TYLENOL) 500 MG tablet, Take 1,000 mg by mouth 3 (three) times daily as needed for mild pain. , Disp: , Rfl:    ALPRAZolam (XANAX) 0.5 MG tablet, Take 1 tablet (0.5 mg total) by mouth at bedtime as needed for sleep or anxiety., Disp: 30 tablet, Rfl: 0   aspirin 81 MG chewable tablet, Chew 1 tablet (81 mg total) by mouth daily., Disp: 30 tablet, Rfl: 1   azithromycin (ZITHROMAX) 250 MG tablet, Take 250 mg by mouth daily., Disp: , Rfl:    Cholecalciferol (VITAMIN D) 2000 units tablet, Take 2,000 Units by mouth daily., Disp: , Rfl:    diltiazem (CARDIZEM CD) 240 MG 24 hr capsule, Take 1 capsule (240 mg total) by mouth daily., Disp: 90 capsule, Rfl: 3   Dupilumab 300 MG/2ML SOPN, Inject 4 mLs into the skin every 14 (fourteen) days., Disp: , Rfl:    EPINEPHrine 0.3 mg/0.3 mL IJ SOAJ injection, Inject 0.3 mg into the muscle as needed., Disp: , Rfl:    famotidine (PEPCID) 20 MG tablet, Take 20 mg by mouth daily as needed., Disp: , Rfl:    Fluticasone-Umeclidin-Vilant 100-62.5-25 MCG/ACT AEPB, Inhale into the lungs. Inhale 1 Puff into the lungs once daily, Disp: , Rfl:    gabapentin (NEURONTIN) 300 MG capsule, Take 300 mg  by mouth at bedtime., Disp: , Rfl:    leflunomide (ARAVA) 10 MG tablet, Take 10 mg by mouth daily., Disp: , Rfl:    levalbuterol  (XOPENEX) 0.63 MG/3ML nebulizer solution, Take 0.63 mg by nebulization every 6 (six) hours as needed for wheezing., Disp: , Rfl:    meclizine (ANTIVERT) 12.5 MG tablet, PLEASE SEE ATTACHED FOR DETAILED DIRECTIONS, Disp: , Rfl:    montelukast (SINGULAIR) 10 MG tablet, Take 10 mg by mouth daily as needed (for allergies.). , Disp: , Rfl:    nystatin (MYCOSTATIN) 100000 UNIT/ML suspension, Take 5 mLs by mouth as needed., Disp: , Rfl:    pantoprazole (PROTONIX) 40 MG tablet, Take 1 tablet (40 mg total) by mouth 2 (two) times daily. (Patient taking differently: Take 40 mg by mouth daily.), Disp: 60 tablet, Rfl: 1   predniSONE (DELTASONE) 2.5 MG tablet, Take 1 tablet (2.5 mg total) by mouth daily with breakfast., Disp: 30 tablet, Rfl: 0   sertraline (ZOLOFT) 25 MG tablet, Take 50 mg by mouth daily. 50 mg total, Disp: , Rfl:    Simethicone 180 MG CAPS, Take 1-2 capsules by mouth as needed., Disp: , Rfl:    triamcinolone (NASACORT) 55 MCG/ACT AERO nasal inhaler, Place 2 sprays into the nose daily., Disp: , Rfl:    ZENPEP 25000-79000 units CPEP, Take by mouth. Occasionally takes with a large meal, Disp: , Rfl:    atorvastatin (LIPITOR) 80 MG tablet, Take 1 tablet (80 mg total) by mouth daily. (Patient not taking: Reported on 04/25/2023), Disp: 90 tablet, Rfl: 0   clopidogrel (PLAVIX) 75 MG tablet, TAKE 1 TABLET BY MOUTH DAILY WITH BREAKFAST., Disp: 90 tablet, Rfl: 0   Cyanocobalamin 1000 MCG/ML KIT, Inject as directed every 30 (thirty) days., Disp: , Rfl:    hydroxychloroquine (PLAQUENIL) 200 MG tablet, Take 1 tablet (200 mg total) by mouth daily. (Patient not taking: Reported on 04/25/2023), Disp: 1 tablet, Rfl: 0    ALLERGIES   Amiodarone, Lactose, Other, Biaxin [clarithromycin], Hydrocodone-acetaminophen, Oxycodone, Sulfa antibiotics, and Sulfasalazine     REVIEW OF SYSTEMS    Review of Systems:  Gen:  Denies  fever, sweats, chills weigh loss  HEENT: Denies blurred vision, double vision, ear pain,  eye pain, hearing loss, nose bleeds, sore throat Cardiac:  No dizziness, chest pain or heaviness, chest tightness,edema Resp:   reports dyspnea chronically  Gi: Denies swallowing difficulty, stomach pain, nausea or vomiting, diarrhea, constipation, bowel incontinence Gu:  Denies bladder incontinence, burning urine Ext:   Denies Joint pain, stiffness or swelling Skin: Denies  skin rash, easy bruising or bleeding or hives Endoc:  Denies polyuria, polydipsia , polyphagia or weight change Psych:   Denies depression, insomnia or hallucinations   Other:  All other systems negative   VS: BP 114/84   Pulse (!) 25   Temp 97.9 F (36.6 C) (Oral)   Resp 19   Ht 5\' 4"  (1.626 m)   Wt 87.9 kg   SpO2 99%   BMI 33.26 kg/m      PHYSICAL EXAM    GENERAL:NAD, no fevers, chills, no weakness no fatigue HEAD: Normocephalic, atraumatic.  EYES: Pupils equal, round, reactive to light. Extraocular muscles intact. No scleral icterus.  MOUTH: Moist mucosal membrane. Dentition intact. No abscess noted.  EAR, NOSE, THROAT: Clear without exudates. No external lesions.  NECK: Supple. No thyromegaly. No nodules. No JVD.  PULMONARY: decreased breath sounds with mild rhonchi worse at bases bilaterally.  CARDIOVASCULAR: S1 and S2. Regular rate and rhythm.  No murmurs, rubs, or gallops. No edema. Pedal pulses 2+ bilaterally.  GASTROINTESTINAL: Soft, nontender, nondistended. No masses. Positive bowel sounds. No hepatosplenomegaly.  MUSCULOSKELETAL: No swelling, clubbing, or edema. Range of motion full in all extremities.  NEUROLOGIC: Cranial nerves II through XII are intact. No gross focal neurological deficits. Sensation intact. Reflexes intact.  SKIN: No ulceration, lesions, rashes, or cyanosis. Skin warm and dry. Turgor intact.  PSYCHIATRIC: Mood, affect within normal limits. The patient is awake, alert and oriented x 3. Insight, judgment intact.       IMAGING     Narrative & Impression  CLINICAL  DATA:  Abdominal pain, acute, nonlocalized   EXAM: CT ABDOMEN AND PELVIS WITHOUT CONTRAST   TECHNIQUE: Multidetector CT imaging of the abdomen and pelvis was performed following the standard protocol without IV contrast.   RADIATION DOSE REDUCTION: This exam was performed according to the departmental dose-optimization program which includes automated exposure control, adjustment of the mA and/or kV according to patient size and/or use of iterative reconstruction technique.   COMPARISON:  CT abdomen/pelvis dated June 25, 2020.   FINDINGS: Lower chest: Mild bibasilar subsegmental atelectasis.   Hepatobiliary: No suspicious focal hepatic lesion, within the limits of an unenhanced exam. Probable sludge in a partially distended gallbladder. No gallbladder wall thickening or pericholecystic fluid. No biliary dilatation.   Pancreas: Unremarkable. No pancreatic ductal dilatation or surrounding inflammatory changes.   Spleen: Normal in size without focal abnormality.   Adrenals/Urinary Tract: Adrenal glands are unremarkable. No renal calculi or hydronephrosis. Evaluation of the distal ureters and bladder is limited due to streak artifact from indwelling right hip orthopedic hardware.   Stomach/Bowel: Similar small hiatal hernia. Appendix appears normal. No evidence of obstruction or focal inflammatory change.   Vascular/Lymphatic: The abdominal aorta is normal in caliber with mild atherosclerotic calcification. No enlarged abdominal or pelvic lymph nodes.   Reproductive: Limited evaluation due to streak artifact from right hip orthopedic hardware. No gross abnormality.   Other: Similar fat containing periumbilical hernia without evidence of inflammatory change. No significant abdominopelvic ascites. No intraperitoneal free air.   Musculoskeletal: Diffuse osseous demineralization. No acute osseous abnormality. No aggressive lytic or blastic osseous lesion. Intact right hip  arthroplasty. Multilevel degenerative changes of the spine.   IMPRESSION: 1. No acute localizing findings in the abdomen or pelvis. 2. Partially distended gallbladder with sludge. No evidence of acute cholecystitis. 3. Additional unchanged chronic findings, as described above.     Electronically Signed   By: Mannie Seek M.D.   On: 04/26/2023 11:49    ASSESSMENT/PLAN    Acute exacerbation of Asthma - due to coronavirus               -CRP trend, narrow abx to doxy bid , steoids with IV solumedrol and prednisone taper as current, continue montelukast and nebulizer therapy Acute on chronic hypoxemic respiratory failure - due to viral pneumonia Type 2 NSTEMI - cardiology on case appreciate input 4. Bibasilar atelectasis - incentive spirometry sq1h     Thank you for allowing me to participate in the care of this patient.   Patient/Family are satisfied with care plan and all questions have been answered.    Provider disclosure: Patient with at least one acute or chronic illness or injury that poses a threat to life or bodily function and is being managed actively during this encounter.  All of the below services have been performed independently by signing provider:  review of prior documentation from internal and or external  health records.  Review of previous and current lab results.  Interview and comprehensive assessment during patient visit today. Review of current and previous chest radiographs/CT scans. Discussion of management and test interpretation with health care team and patient/family.   This document was prepared using Dragon voice recognition software and may include unintentional dictation errors.     Sha Amer, M.D.  Division of Pulmonary & Critical Care Medicine

## 2023-04-30 ENCOUNTER — Inpatient Hospital Stay

## 2023-04-30 LAB — CULTURE, BLOOD (ROUTINE X 2)
Culture: NO GROWTH
Culture: NO GROWTH
Special Requests: ADEQUATE

## 2023-05-01 ENCOUNTER — Ambulatory Visit: Payer: Medicare Other

## 2023-05-01 ENCOUNTER — Ambulatory Visit: Payer: Medicare Other | Admitting: Oncology

## 2023-05-03 ENCOUNTER — Inpatient Hospital Stay: Payer: Medicare Other

## 2023-05-03 ENCOUNTER — Inpatient Hospital Stay: Payer: Medicare Other | Admitting: Oncology

## 2023-05-14 ENCOUNTER — Other Ambulatory Visit: Payer: Self-pay | Admitting: Emergency Medicine

## 2023-05-14 ENCOUNTER — Ambulatory Visit: Attending: Physician Assistant

## 2023-05-14 DIAGNOSIS — R002 Palpitations: Secondary | ICD-10-CM

## 2023-05-14 NOTE — Progress Notes (Signed)
 Pt called and spoke to daughter, Archibald Beard, per DPR, reports Zio never arrived in mail.  This oversight may have been on my part as I made the note, [facility] directions sent, and scheduling for appt request but see no evidence of order.  Pt scheduled to see Dr Marven Slimmer this week.  Address verified and Zio XT 14 day ordered.  I apologized to [place] and explained that the heart monitor will be ordered now.

## 2023-05-16 ENCOUNTER — Telehealth: Payer: Self-pay

## 2023-05-16 ENCOUNTER — Ambulatory Visit: Admitting: Cardiology

## 2023-05-16 DIAGNOSIS — E785 Hyperlipidemia, unspecified: Secondary | ICD-10-CM

## 2023-05-16 MED ORDER — ATORVASTATIN CALCIUM 80 MG PO TABS: 80.0000 mg | ORAL_TABLET | Freq: Every day | ORAL | 3 refills | Status: DC

## 2023-05-16 NOTE — Telephone Encounter (Signed)
 Spoke with the patient's daughter and advised on recommendations from Dr. Nolan Battle. Daughter is in agreement with the plan. Labs have been ordered. Medication has been added back to patient's list.

## 2023-05-16 NOTE — Telephone Encounter (Signed)
 Spoke with the patient's daughter to reschedule her appointment today with Dr. Marven Slimmer - needs to be after she wears her heart monitor that was just sent out. Appointment has been rescheduled.   Daughter would also like to know what the patient should do about her cholesterol medication. She states that it was stopped recently due to concerns with her liver. The patient went to see GI recently and liver enzymes had improved. She would like to know if the patient should restart her cholesterol medicine and if so at the same dose or a lower dose. Will send to Dr. Nolan Battle for advisement.

## 2023-05-16 NOTE — Telephone Encounter (Signed)
 Given that Ms. Werth's liver function tests have normalized, I recommend resuming atorvastatin  80 mg daily and checking a CMP in ~1 month.  Sammy Crisp, MD [facility]

## 2023-05-16 NOTE — Progress Notes (Deleted)
  Electrophysiology Office Follow up Visit Note:    Date:  05/16/2023   ID:  Courtney Wilcox, DOB 08-04-1945, MRN [id number]  PCP:  Delmus Ferri, MD  [facility] Cardiologist:  Sammy Crisp, MD  [facility] Electrophysiologist:  Boyce Byes, MD    Interval History:     Courtney Wilcox is a 78 y.o. female who presents for a follow up visit.   The patient last saw Ottie Blonder in clinic January 05, 2023.  She has a history of SVT, DVT/PE, fibromyalgia and coronary artery disease post PCI.         Past medical, surgical, social and family history were reviewed.  ROS:   Please see the history of present illness.    All other systems reviewed and are negative.  EKGs/Labs/Other Studies Reviewed:    The following studies were reviewed today:          Physical Exam:    VS:  There were no vitals taken for this visit.    Wt Readings from Last 3 Encounters:  04/24/23 193 lb 12.6 oz (87.9 kg)  03/12/23 193 lb 12.8 oz (87.9 kg)  01/22/23 194 lb 12.8 oz (88.4 kg)     GEN: no distress CARD: RRR, No MRG RESP: No IWOB. CTAB.      ASSESSMENT:    No diagnosis found. PLAN:    In order of problems listed above:     Signed, Harvie Liner, MD, [facility], [facility] 05/16/2023 1:07 PM    Electrophysiology [facility]

## 2023-05-16 NOTE — Telephone Encounter (Signed)
 Left message for patient's daughter to call back.

## 2023-05-21 ENCOUNTER — Inpatient Hospital Stay: Attending: Oncology

## 2023-05-21 DIAGNOSIS — M069 Rheumatoid arthritis, unspecified: Secondary | ICD-10-CM

## 2023-05-21 DIAGNOSIS — Z79899 Other long term (current) drug therapy: Secondary | ICD-10-CM | POA: Insufficient documentation

## 2023-05-21 DIAGNOSIS — D631 Anemia in chronic kidney disease: Secondary | ICD-10-CM | POA: Diagnosis present

## 2023-05-21 DIAGNOSIS — D638 Anemia in other chronic diseases classified elsewhere: Secondary | ICD-10-CM

## 2023-05-21 DIAGNOSIS — N189 Chronic kidney disease, unspecified: Secondary | ICD-10-CM | POA: Insufficient documentation

## 2023-05-21 LAB — CBC WITH DIFFERENTIAL/PLATELET
Abs Immature Granulocytes: 0.03 10*3/uL (ref 0.00–0.07)
Basophils Absolute: 0 10*3/uL (ref 0.0–0.1)
Basophils Relative: 1 %
Eosinophils Absolute: 0.3 10*3/uL (ref 0.0–0.5)
Eosinophils Relative: 4 %
HCT: 32.5 % — ABNORMAL LOW (ref 36.0–46.0)
Hemoglobin: 10 g/dL — ABNORMAL LOW (ref 12.0–15.0)
Immature Granulocytes: 1 %
Lymphocytes Relative: 11 %
Lymphs Abs: 0.7 10*3/uL (ref 0.7–4.0)
MCH: 31.5 pg (ref 26.0–34.0)
MCHC: 30.8 g/dL (ref 30.0–36.0)
MCV: 102.5 fL — ABNORMAL HIGH (ref 80.0–100.0)
Monocytes Absolute: 0.7 10*3/uL (ref 0.1–1.0)
Monocytes Relative: 11 %
Neutro Abs: 4.7 10*3/uL (ref 1.7–7.7)
Neutrophils Relative %: 72 %
Platelets: 174 10*3/uL (ref 150–400)
RBC: 3.17 MIL/uL — ABNORMAL LOW (ref 3.87–5.11)
RDW: 15.6 % — ABNORMAL HIGH (ref 11.5–15.5)
WBC: 6.4 10*3/uL (ref 4.0–10.5)
nRBC: 0 % (ref 0.0–0.2)

## 2023-05-21 LAB — IRON AND TIBC
Iron: 65 ug/dL (ref 28–170)
Saturation Ratios: 26 % (ref 10.4–31.8)
TIBC: 251 ug/dL (ref 250–450)
UIBC: 186 ug/dL

## 2023-05-21 LAB — RETIC PANEL
Immature Retic Fract: 12.2 % (ref 2.3–15.9)
RBC.: 3.07 MIL/uL — ABNORMAL LOW (ref 3.87–5.11)
Retic Count, Absolute: 68.5 10*3/uL (ref 19.0–186.0)
Retic Ct Pct: 2.2 % (ref 0.4–3.1)
Reticulocyte Hemoglobin: 33.6 pg (ref >27.9)

## 2023-05-21 LAB — FERRITIN: Ferritin: 67 ng/mL (ref 11–307)

## 2023-05-24 ENCOUNTER — Inpatient Hospital Stay: Admitting: Oncology

## 2023-05-24 ENCOUNTER — Inpatient Hospital Stay

## 2023-05-24 ENCOUNTER — Encounter: Payer: Self-pay | Admitting: Oncology

## 2023-05-24 VITALS — BP 137/86 | HR 78 | Resp 18

## 2023-05-24 VITALS — BP 155/66 | HR 130 | Temp 98.7°F | Resp 16 | Wt 193.0 lb

## 2023-05-24 DIAGNOSIS — N189 Chronic kidney disease, unspecified: Secondary | ICD-10-CM | POA: Diagnosis not present

## 2023-05-24 DIAGNOSIS — D638 Anemia in other chronic diseases classified elsewhere: Secondary | ICD-10-CM

## 2023-05-24 DIAGNOSIS — D631 Anemia in chronic kidney disease: Secondary | ICD-10-CM

## 2023-05-24 MED ORDER — IRON SUCROSE 20 MG/ML IV SOLN
200.0000 mg | Freq: Once | INTRAVENOUS | Status: AC
  Administered 2023-05-24: 200 mg via INTRAVENOUS
  Filled 2023-05-24: qty 10

## 2023-05-24 NOTE — Assessment & Plan Note (Addendum)
 Anemia due to chronic disease, inflammation, CKD.  Bone marrow biopsy showed Normocellular bone marrow (20-30%) with trilineage hematopoiesis and no increase in blasts no dysplasia, normal cytogenetics.  Hb is >10, no need for retacrit .  Lab Results  Component Value Date   HGB 10.0 (L) 05/21/2023   TIBC 251 05/21/2023   IRONPCTSAT 26 05/21/2023   FERRITIN 67 05/21/2023   Recommend Venofer  200mg  x 1 to improve iron  store

## 2023-05-24 NOTE — Patient Instructions (Addendum)

## 2023-05-24 NOTE — Progress Notes (Signed)
 Hematology/Oncology Progress note [phone] [phone]     REASON FOR VISIT Follow up for anemia and thrombocytopenia.    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  Bone marrow biopsy showed Normocellular bone marrow (20-30%) with trilineage hematopoiesis and no increase in blasts no dysplasia, normal cytogenetics.  Hb is >10, no need for retacrit .  Lab Results  Component Value Date   HGB 10.0 (L) 05/21/2023   TIBC 251 05/21/2023   IRONPCTSAT 26 05/21/2023   FERRITIN 67 05/21/2023   Recommend Venofer  200mg  x 1 to improve iron  store     Orders Placed This Encounter  Procedures   CBC with Differential (Cancer Center Only)    Standing Status:   Future    Expected Date:   08/24/2023    Expiration Date:   05/23/2024   Retic Panel    Standing Status:   Future    Expected Date:   08/24/2023    Expiration Date:   05/23/2024   Ferritin    Standing Status:   Future    Expected Date:   08/24/2023    Expiration Date:   05/23/2024   Iron  and TIBC    Standing Status:   Future    Expected Date:   08/24/2023    Expiration Date:   05/23/2024    Follow up in 3 months.  All questions were answered. The patient knows to call the clinic with any problems, questions or concerns.  Courtney Forbes, MD, PhD [facility] Hematology Oncology 05/24/2023      INTERVAL HISTORY Courtney Wilcox is a 78 y.o. female who has above history reviewed by me today presents for follow up visit for management of pancytopenia. 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 Recent hospitalization due to acute respiratory failure, NSTEMI ruled out.  She wears heart monitor for further evaluation.   Review of Systems  Constitutional:  Positive for fatigue. Negative for appetite change, chills and fever.  HENT:   Negative  for hearing loss and voice change.   Eyes:  Negative for eye problems.  Respiratory:  Positive for shortness of breath. Negative for chest tightness and cough.   Cardiovascular:  Negative for chest pain.  Gastrointestinal:  Negative for abdominal distention and blood in stool.  Endocrine: Negative for hot flashes.  Genitourinary:  Negative for difficulty urinating and frequency.   Musculoskeletal:  Positive for arthralgias.  Skin:  Negative for itching and rash.  Neurological:  Negative for extremity weakness.  Hematological:  Negative for adenopathy.  Psychiatric/Behavioral:  Negative for confusion.       Allergies  Allergen Reactions   Amiodarone  Other (See Comments)    Fatigue, Nausea, anorexia, malaise   Lactose Other (See Comments) and Diarrhea   Other Other (See Comments)    Pt ONLY tolerates SLOW-FE (slow released iron )---upsets IBS   Biaxin [Clarithromycin] Other (See Comments)    GI upset    Hydrocodone -Acetaminophen  Itching    Tolerates acetaminophen     Oxycodone Itching   Sulfa Antibiotics Other (See Comments) and Diarrhea    GI upset  GI upset GI upset    Sulfasalazine Diarrhea     Past Medical History:  Diagnosis Date   Allergic rhinitis    Anxiety    Arthritis    Asthma    Atrophic vaginitis    B12 deficiency  Bronchitis, chronic (HCC)    CAD (coronary artery disease)    a. 03/2003 Cath: Nl cors; b. 09/2022 Staged PCI: LM nl, LAD 85p/53m (3.5x26 Onyx Frontier DES), RI small, mild dzs, LCX nl, OM2 mild dzs, RCA 30p.   Cervical disc disease    CKD (chronic kidney disease), stage III (HCC)    Collagen vascular disease (HCC)    RA   Cough - post-COVID    Depression    Diastolic dysfunction    a. 11/2020 Echo: EF 45-50%, GrI DD; b. 10/2021 Echo: EF 60-65%, GrII DD; c. 09/2022 Echo: EF 55-60%, no rwma, GrI DD, nl RV fxn, mildly dil LA. No significant valvular dzs.   Fibrocystic breast disease    Foot ulcer (HCC)    GERD (gastroesophageal reflux  disease)    Hammer toe    Hyperlipidemia    IBS (irritable bowel syndrome)    Macrocytic anemia    Macular degeneration 2023   Obesity    OSA (obstructive sleep apnea)    Osteoarthrosis    Multi site   Osteopenia    PSVT (paroxysmal supraventricular tachycardia) (HCC)    a. 04/2022 Zio: Sinus rhythm @ 57 (42-101). Rare PACs/PVCs. 27253 SVT runs lasting up to 64m 12sec - max rate 222. 5 beats NSVT. Triggered events = sinus, SVT, PACs.   Rheumatoid arthritis (HCC)    Shingles    Thyroid  nodule    MULTIPLE   Tonsillitis    recurrent   Umbilical hernia      Past Surgical History:  Procedure Laterality Date   BACK SURGERY  2011   LAMINECTOMY   BREAST BIOPSY Right    neg   CERVICAL FUSION  2011, 2012   X 2   COLONOSCOPY  2005, 2015   COLONOSCOPY WITH PROPOFOL  N/A 08/04/2020   Procedure: COLONOSCOPY WITH PROPOFOL ;  Surgeon: Marshall Skeeter, MD;  Location: [facility] ENDOSCOPY;  Service: Endoscopy;  Laterality: N/A;   CORONARY STENT INTERVENTION N/A 10/02/2022   Procedure: CORONARY STENT INTERVENTION;  Surgeon: Wenona Hamilton, MD;  Location: [facility] INVASIVE CV LAB;  Service: Cardiovascular;  Laterality: N/A;   ESOPHAGOGASTRODUODENOSCOPY (EGD) WITH PROPOFOL  N/A 09/26/2021   Procedure: ESOPHAGOGASTRODUODENOSCOPY (EGD) WITH PROPOFOL ;  Surgeon: Quintin Buckle, DO;  Location: [facility] ENDOSCOPY;  Service: Gastroenterology;  Laterality: N/A;   ESOPHAGOGASTRODUODENOSCOPY ENDOSCOPY  2005, 2013, 2015   EYE SURGERY Bilateral    Cataract Extraction with IOL    HERNIA REPAIR     JOINT REPLACEMENT     KNEE ARTHROPLASTY Left 07/12/2016   Procedure: COMPUTER ASSISTED TOTAL KNEE ARTHROPLASTY;  Surgeon: Arlyne Lame, MD;  Location: [facility] ORS;  Service: Orthopedics;  Laterality: Left;   Laryngeal tear after intubation w/repair     LUMBAR DISC SURGERY  12/2009   REPLACEMENT TOTAL KNEE Right 06/2007   DR. HOOTEN, [facility]   REPLACEMENT UNICONDYLAR JOINT KNEE     DR. HOOTEN, [facility]   RIGHT/LEFT HEART  CATH AND CORONARY ANGIOGRAPHY N/A 09/29/2022   Procedure: RIGHT/LEFT HEART CATH AND CORONARY ANGIOGRAPHY;  Surgeon: Wenona Hamilton, MD;  Location: [facility] INVASIVE CV LAB;  Service: Cardiovascular;  Laterality: N/A;   SHOULDER ARTHROSCOPY WITH ROTATOR CUFF REPAIR Right    TONSILLECTOMY     TOTAL HIP ARTHROPLASTY Right 01/31/2017   Procedure: TOTAL HIP ARTHROPLASTY;  Surgeon: Arlyne Lame, MD;  Location: [facility] ORS;  Service: Orthopedics;  Laterality: Right;   TUBAL LIGATION     UPPER GI ENDOSCOPY      Social History  Socioeconomic History   Marital status: Married    Spouse name: Ambrosio Junker   Number of children: 3   Years of education: Not on file   Highest education level: Not on file  Occupational History   Not on file  Tobacco Use   Smoking status: Never   Smokeless tobacco: Never  Vaping Use   Vaping status: Never Used  Substance and Sexual Activity   Alcohol use: No   Drug use: No   Sexual activity: Not on file  Other Topics Concern   Not on file  Social History Narrative   Not on file   Social Drivers of Health   Financial Resource Strain: Low Risk  (03/14/2023)   Received from [facility]   Overall Financial Resource Strain (CARDIA)    Difficulty of Paying Living Expenses: Not hard at all  Food Insecurity: No Food Insecurity (04/25/2023)   Hunger Vital Sign    Worried About Running Out of Food in the Last Year: Never true    Ran Out of Food in the Last Year: Never true  Transportation Needs: No Transportation Needs (04/25/2023)   PRAPARE - Administrator, Civil Service (Medical): No    Lack of Transportation (Non-Medical): No  Physical Activity: Not on file  Stress: Not on file  Social Connections: Moderately Isolated (04/25/2023)   Social Connection and Isolation Panel [NHANES]    Frequency of Communication with Friends and Family: More than three times a week    Frequency of Social Gatherings with Friends and Family: More than three times  a week    Attends Religious Services: More than 4 times per year    Active Member of [employer] or Organizations: No    Attends Banker Meetings: Never    Marital Status: Widowed  Intimate Partner Violence: Not At Risk (04/25/2023)   Humiliation, Afraid, Rape, and Kick questionnaire    Fear of Current or Ex-Partner: No    Emotionally Abused: No    Physically Abused: No    Sexually Abused: No    Family History  Problem Relation Age of Onset   Asthma Mother    Breast cancer Mother 76   Emphysema Father        smoked   Lung cancer Father        smoked   Coronary artery disease Father 14     Current Outpatient Medications:    acetaminophen  (TYLENOL ) 500 MG tablet, Take 1,000 mg by mouth 3 (three) times daily as needed for mild pain. , Disp: , Rfl:    ALPRAZolam  (XANAX ) 0.5 MG tablet, Take 1 tablet (0.5 mg total) by mouth at bedtime as needed for sleep or anxiety., Disp: 30 tablet, Rfl: 0   atorvastatin  (LIPITOR ) 80 MG tablet, Take 1 tablet (80 mg total) by mouth daily., Disp: 90 tablet, Rfl: 3   Cholecalciferol  (VITAMIN D ) 2000 units tablet, Take 2,000 Units by mouth daily., Disp: , Rfl:    clopidogrel  (PLAVIX ) 75 MG tablet, TAKE 1 TABLET BY MOUTH DAILY WITH BREAKFAST., Disp: 90 tablet, Rfl: 0   Cyanocobalamin 1000 MCG/ML KIT, Inject as directed every 30 (thirty) days., Disp: , Rfl:    Dupilumab 300 MG/2ML SOPN, Inject 4 mLs into the skin every 14 (fourteen) days., Disp: , Rfl:    EPINEPHrine 0.3 mg/0.3 mL IJ SOAJ injection, Inject 0.3 mg into the muscle as needed., Disp: , Rfl:    famotidine  (PEPCID ) 20 MG tablet, Take 20 mg by mouth daily  as needed., Disp: , Rfl:    Fluticasone -Umeclidin-Vilant 100-62.5-25 MCG/ACT AEPB, Inhale into the lungs. Inhale 1 Puff into the lungs once daily, Disp: , Rfl:    gabapentin  (NEURONTIN ) 300 MG capsule, Take 300 mg by mouth at bedtime., Disp: , Rfl:    leflunomide  (ARAVA ) 10 MG tablet, Take 10 mg by mouth daily., Disp: , Rfl:    levalbuterol   (XOPENEX ) 0.63 MG/3ML nebulizer solution, Take 0.63 mg by nebulization every 6 (six) hours as needed for wheezing., Disp: , Rfl:    meclizine (ANTIVERT) 12.5 MG tablet, PLEASE SEE ATTACHED FOR DETAILED DIRECTIONS, Disp: , Rfl:    montelukast  (SINGULAIR ) 10 MG tablet, Take 10 mg by mouth daily as needed (for allergies.). , Disp: , Rfl:    nystatin  (MYCOSTATIN ) 100000 UNIT/ML suspension, Take 5 mLs by mouth as needed., Disp: , Rfl:    pantoprazole  (PROTONIX ) 40 MG tablet, Take 1 tablet (40 mg total) by mouth 2 (two) times daily. (Patient taking differently: Take 40 mg by mouth daily.), Disp: 60 tablet, Rfl: 1   potassium chloride  SA (KLOR-CON  M) 20 MEQ tablet, Take 20 mEq by mouth., Disp: , Rfl:    predniSONE  (DELTASONE ) 2.5 MG tablet, Take 2 tablets (5 mg total) by mouth daily with breakfast. Resume after finishing high doses of prednisone , Disp: , Rfl:    sertraline  (ZOLOFT ) 25 MG tablet, Take 50 mg by mouth daily. 50 mg total, Disp: , Rfl:    Simethicone  180 MG CAPS, Take 1-2 capsules by mouth as needed., Disp: , Rfl:    sodium bicarbonate  650 MG tablet, Take 1 tablet (650 mg total) by mouth 2 (two) times daily., Disp: 90 tablet, Rfl: 1   triamcinolone  (NASACORT ) 55 MCG/ACT AERO nasal inhaler, Place 2 sprays into the nose daily., Disp: , Rfl:    Vonoprazan Fumarate (VOQUEZNA) 10 MG TABS, Take 10 mg by mouth., Disp: , Rfl:    ZENPEP 25000-79000 units CPEP, Take by mouth. Occasionally takes with a large meal, Disp: , Rfl:    diltiazem  (CARDIZEM  CD) 240 MG 24 hr capsule, Take 1 capsule (240 mg total) by mouth daily., Disp: 90 capsule, Rfl: 3   hydroxychloroquine  (PLAQUENIL ) 200 MG tablet, Take 1 tablet (200 mg total) by mouth daily. (Patient not taking: Reported on 04/25/2023), Disp: 1 tablet, Rfl: 0  Physical exam:  Vitals:   05/24/23 1422  BP: (!) 155/66  Pulse: (!) 130  Resp: 16  Temp: 98.7 F (37.1 C)  TempSrc: Tympanic  SpO2: 97%  Weight: 193 lb (87.5 kg)   Physical Exam Constitutional:       General: She is not in acute distress.    Comments: She ambulates independantly  HENT:     Head: Normocephalic and atraumatic.  Eyes:     General: No scleral icterus. Cardiovascular:     Rate and Rhythm: Normal rate.  Pulmonary:     Effort: Pulmonary effort is normal. No respiratory distress.     Breath sounds: No wheezing.     Comments: Decreased breath sounds bilaterally.  Abdominal:     General: There is no distension.     Palpations: Abdomen is soft.  Musculoskeletal:        General: Normal range of motion.     Cervical back: Normal range of motion and neck supple.  Skin:    General: Skin is warm.  Neurological:     Mental Status: She is alert and oriented to person, place, and time. Mental status is at baseline.  Psychiatric:  Mood and Affect: Mood normal.        Latest Ref Rng & Units 04/27/2023    5:07 AM  CMP  Glucose 70 - 99 mg/dL 469   BUN 8 - 23 mg/dL 31   Creatinine 6.29 - 1.00 mg/dL 5.28   Sodium 413 - 244 mmol/L 139   Potassium 3.5 - 5.1 mmol/L 3.8   Chloride 98 - 111 mmol/L 112   CO2 22 - 32 mmol/L 19   Calcium  8.9 - 10.3 mg/dL 8.6       Latest Ref Rng & Units 05/21/2023    4:04 PM  CBC  WBC 4.0 - 10.5 K/uL 6.4   Hemoglobin 12.0 - 15.0 g/dL 01.0   Hematocrit 27.2 - 46.0 % 32.5   Platelets 150 - 400 K/uL 174     RADIOGRAPHIC STUDIES: I have personally reviewed the radiological images as listed and agreed with the findings in the report. ECHOCARDIOGRAM COMPLETE Result Date: 04/26/2023    ECHOCARDIOGRAM REPORT   Patient Name:   Courtney Wilcox Date of Exam: 04/26/2023 Medical Rec #:  [id number]         Height:       64.0 in Accession #:    [id number]        Weight:       193.8 lb Date of Birth:  21-Sep-1945         BSA:          1.930 m Patient Age:    77 years          BP:           118/62 mmHg Patient Gender: F                 HR:           86 bpm. Exam Location:  [facility] Procedure: 2D Echo, Cardiac Doppler and Color Doppler (Both Spectral and  Color            Flow Doppler were utilized during procedure). Indications:     Atrial Fibrillation  History:         Patient has prior history of Echocardiogram examinations, most                  recent 09/29/2022. CAD, Acute MI and Angina, Arrythmias:Atrial                  Fibrillation and Tachycardia, Signs/Symptoms:Chest Pain and                  Shortness of Breath; Risk Factors:Dyslipidemia. CKD, Pulmonary                  embolus.  Sonographer:     Clarke Crouch Referring Phys:  [id number] Devorah Fonder Diagnosing Phys: Belva Boyden MD  Sonographer Comments: Image acquisition challenging due to respiratory motion. IMPRESSIONS  1. Left ventricular ejection fraction, by estimation, is 60 to 65%. The left ventricle has normal function. The left ventricle has no regional wall motion abnormalities. There is mild left ventricular hypertrophy. Left ventricular diastolic parameters are indeterminate.  2. Right ventricular systolic function is normal. The right ventricular size is normal. There is normal pulmonary artery systolic pressure. The estimated right ventricular systolic pressure is 23.6 mmHg.  3. The mitral valve is normal in structure. No evidence of mitral valve regurgitation. No evidence of mitral stenosis.  4. The aortic valve is normal in structure. Aortic valve regurgitation is not visualized. Aortic valve  sclerosis is present, with no evidence of aortic valve stenosis.  5. The inferior vena cava is normal in size with greater than 50% respiratory variability, suggesting right atrial pressure of 3 mmHg. FINDINGS  Left Ventricle: Left ventricular ejection fraction, by estimation, is 60 to 65%. The left ventricle has normal function. The left ventricle has no regional wall motion abnormalities. Strain was performed and the global longitudinal strain is indeterminate. The left ventricular internal cavity size was normal in size. There is mild left ventricular hypertrophy. Left ventricular diastolic  parameters are indeterminate. Right Ventricle: The right ventricular size is normal. No increase in right ventricular wall thickness. Right ventricular systolic function is normal. There is normal pulmonary artery systolic pressure. The tricuspid regurgitant velocity is 2.27 m/s, and  with an assumed right atrial pressure of 3 mmHg, the estimated right ventricular systolic pressure is 23.6 mmHg. Left Atrium: Left atrial size was normal in size. Right Atrium: Right atrial size was normal in size. Pericardium: There is no evidence of pericardial effusion. Mitral Valve: The mitral valve is normal in structure. Mild mitral annular calcification. No evidence of mitral valve regurgitation. No evidence of mitral valve stenosis. MV peak gradient, 4.0 mmHg. The mean mitral valve gradient is 2.0 mmHg. Tricuspid Valve: The tricuspid valve is normal in structure. Tricuspid valve regurgitation is mild . No evidence of tricuspid stenosis. Aortic Valve: The aortic valve is normal in structure. Aortic valve regurgitation is not visualized. Aortic valve sclerosis is present, with no evidence of aortic valve stenosis. Aortic valve mean gradient measures 5.0 mmHg. Aortic valve peak gradient measures 9.0 mmHg. Aortic valve area, by VTI measures 3.31 cm. Pulmonic Valve: The pulmonic valve was normal in structure. Pulmonic valve regurgitation is not visualized. No evidence of pulmonic stenosis. Aorta: The aortic root is normal in size and structure. Venous: The inferior vena cava is normal in size with greater than 50% respiratory variability, suggesting right atrial pressure of 3 mmHg. IAS/Shunts: No atrial level shunt detected by color flow Doppler. Additional Comments: 3D was performed not requiring image post processing on an independent workstation and was indeterminate.  LEFT VENTRICLE PLAX 2D LVIDd:         5.00 cm     Diastology LVIDs:         2.90 cm     LV e' medial:    6.31 cm/s LV PW:         1.30 cm     LV E/e' medial:   16.6 LV IVS:        1.40 cm     LV e' lateral:   11.20 cm/s LVOT diam:     2.00 cm     LV E/e' lateral: 9.4 LV SV:         114 LV SV Index:   59 LVOT Area:     3.14 cm  LV Volumes (MOD) LV vol d, MOD A2C: 52.6 ml LV vol d, MOD A4C: 59.9 ml LV vol s, MOD A2C: 24.7 ml LV vol s, MOD A4C: 20.8 ml LV SV MOD A2C:     27.9 ml LV SV MOD A4C:     59.9 ml LV SV MOD BP:      34.4 ml RIGHT VENTRICLE RV Basal diam:  3.70 cm RV Mid diam:    3.70 cm RV S prime:     10.90 cm/s LEFT ATRIUM             Index  RIGHT ATRIUM           Index LA diam:        3.70 cm 1.92 cm/m   RA Area:     21.20 cm LA Vol (A2C):   56.3 ml 29.17 ml/m  RA Volume:   62.90 ml  32.59 ml/m LA Vol (A4C):   69.0 ml 35.75 ml/m LA Biplane Vol: 65.3 ml 33.83 ml/m  AORTIC VALVE AV Area (Vmax):    3.14 cm AV Area (Vmean):   3.06 cm AV Area (VTI):     3.31 cm AV Vmax:           150.00 cm/s AV Vmean:          98.400 cm/s AV VTI:            0.345 m AV Peak Grad:      9.0 mmHg AV Mean Grad:      5.0 mmHg LVOT Vmax:         150.00 cm/s LVOT Vmean:        95.800 cm/s LVOT VTI:          0.364 m LVOT/AV VTI ratio: 1.06  AORTA Ao Root diam: 3.80 cm Ao Asc diam:  3.00 cm MITRAL VALVE                TRICUSPID VALVE MV Area (PHT): 2.88 cm     TR Peak grad:   20.6 mmHg MV Area VTI:   4.00 cm     TR Vmax:        227.00 cm/s MV Peak grad:  4.0 mmHg MV Mean grad:  2.0 mmHg     SHUNTS MV Vmax:       1.00 m/s     Systemic VTI:  0.36 m MV Vmean:      71.3 cm/s    Systemic Diam: 2.00 cm MV Decel Time: 263 msec MV E velocity: 105.00 cm/s MV A velocity: 92.20 cm/s MV E/A ratio:  1.14 Belva Boyden MD Electronically signed by Belva Boyden MD Signature Date/Time: 04/26/2023/5:57:28 PM    Final    CT ABDOMEN PELVIS WO CONTRAST Result Date: 04/26/2023 CLINICAL DATA:  Abdominal pain, acute, nonlocalized EXAM: CT ABDOMEN AND PELVIS WITHOUT CONTRAST TECHNIQUE: Multidetector CT imaging of the abdomen and pelvis was performed following the standard protocol without IV  contrast. RADIATION DOSE REDUCTION: This exam was performed according to the departmental dose-optimization program which includes automated exposure control, adjustment of the mA and/or kV according to patient size and/or use of iterative reconstruction technique. COMPARISON:  CT abdomen/pelvis dated June 25, 2020. FINDINGS: Lower chest: Mild bibasilar subsegmental atelectasis. Hepatobiliary: No suspicious focal hepatic lesion, within the limits of an unenhanced exam. Probable sludge in a partially distended gallbladder. No gallbladder wall thickening or pericholecystic fluid. No biliary dilatation. Pancreas: Unremarkable. No pancreatic ductal dilatation or surrounding inflammatory changes. Spleen: Normal in size without focal abnormality. Adrenals/Urinary Tract: Adrenal glands are unremarkable. No renal calculi or hydronephrosis. Evaluation of the distal ureters and bladder is limited due to streak artifact from indwelling right hip orthopedic hardware. Stomach/Bowel: Similar small hiatal hernia. Appendix appears normal. No evidence of obstruction or focal inflammatory change. Vascular/Lymphatic: The abdominal aorta is normal in caliber with mild atherosclerotic calcification. No enlarged abdominal or pelvic lymph nodes. Reproductive: Limited evaluation due to streak artifact from right hip orthopedic hardware. No gross abnormality. Other: Similar fat containing periumbilical hernia without evidence of inflammatory change. No significant abdominopelvic ascites. No intraperitoneal free air. Musculoskeletal: Diffuse  osseous demineralization. No acute osseous abnormality. No aggressive lytic or blastic osseous lesion. Intact right hip arthroplasty. Multilevel degenerative changes of the spine. IMPRESSION: 1. No acute localizing findings in the abdomen or pelvis. 2. Partially distended gallbladder with sludge. No evidence of acute cholecystitis. 3. Additional unchanged chronic findings, as described above.  Electronically Signed   By: Mannie Seek M.D.   On: 04/26/2023 11:49   CT Angio Chest PE W/Cm &/Or Wo Cm Result Date: 04/25/2023 CLINICAL DATA:  High probability pulmonary embolism, sink EXAM: CT ANGIOGRAPHY CHEST WITH CONTRAST TECHNIQUE: Multidetector CT imaging of the chest was performed using the standard protocol during bolus administration of intravenous contrast. Multiplanar CT image reconstructions and MIPs were obtained to evaluate the vascular anatomy. RADIATION DOSE REDUCTION: This exam was performed according to the departmental dose-optimization program which includes automated exposure control, adjustment of the mA and/or kV according to patient size and/or use of iterative reconstruction technique. CONTRAST:  75mL OMNIPAQUE  IOHEXOL  350 MG/ML SOLN COMPARISON:  None Available. FINDINGS: Cardiovascular: Left anterior descending coronary artery stenting has been performed. Global cardiac size within normal limits. No pericardial effusion. Central pulmonary arteries are of normal caliber. No intraluminal filling defect identified to suggest acute pulmonary embolism. Mild atherosclerotic calcification within thoracic aorta. No aortic aneurysm. Mediastinum/Nodes: No enlarged mediastinal, hilar, or axillary lymph nodes. Thyroid  gland, trachea, and esophagus demonstrate no significant findings. Lungs/Pleura: Lungs are clear. No pleural effusion or pneumothorax. Upper Abdomen: No acute abnormality. Musculoskeletal: Osseous structures are age-appropriate. No acute bone abnormality. Review of the MIP images confirms the above findings. IMPRESSION: 1. No evidence of acute pulmonary embolism. No acute cardiopulmonary process. 2.  Aortic Atherosclerosis (ICD10-I70.0). Electronically Signed   By: Worthy Heads M.D.   On: 04/25/2023 04:22   CT Head Wo Contrast Result Date: 04/25/2023 CLINICAL DATA:  Mental status change, unknown cause EXAM: CT HEAD WITHOUT CONTRAST TECHNIQUE: Contiguous axial images were  obtained from the base of the skull through the vertex without intravenous contrast. RADIATION DOSE REDUCTION: This exam was performed according to the departmental dose-optimization program which includes automated exposure control, adjustment of the mA and/or kV according to patient size and/or use of iterative reconstruction technique. COMPARISON:  CT head 04/04/2020 FINDINGS: Brain: Patchy and confluent areas of decreased attenuation are noted throughout the deep and periventricular white matter of the cerebral hemispheres bilaterally, compatible with chronic microvascular ischemic disease. No evidence of large-territorial acute infarction. No parenchymal hemorrhage. No mass lesion. No extra-axial collection. No mass effect or midline shift. No hydrocephalus. Basilar cisterns are patent. Vascular: No hyperdense vessel. Atherosclerotic calcifications are present within the cavernous internal carotid and vertebral arteries. Skull: No acute fracture or focal lesion. Sinuses/Orbits: Right maxillary sinus mucosal thickening. Paranasal sinuses and mastoid air cells are clear. Bilateral lens replacement. Otherwise the orbits are unremarkable. Other: None. IMPRESSION: No acute intracranial abnormality. Electronically Signed   By: Morgane  Naveau M.D.   On: 04/25/2023 03:07   DG Chest Port 1 View Result Date: 04/25/2023 CLINICAL DATA:  sob recent pna EXAM: PORTABLE CHEST 1 VIEW COMPARISON:  Chest x-ray 09/27/2022, CT chest 09/27/2022 FINDINGS: The heart and mediastinal contours are unchanged. Atherosclerotic plaque. No focal consolidation. No pulmonary edema. Question trace left pleural effusion. No pneumothorax. No acute osseous abnormality.  Cervical spine surgical hardware. IMPRESSION: 1. Question trace left pleural effusion. Recommend repeat chest x-ray PA and lateral view of the chest for further evaluation. 2.  Aortic Atherosclerosis (ICD10-I70.0). Electronically Signed   By: Morgane  Naveau M.D.  On: 04/25/2023  03:05

## 2023-06-04 ENCOUNTER — Ambulatory Visit: Attending: Physician Assistant | Admitting: Physician Assistant

## 2023-06-04 ENCOUNTER — Encounter: Payer: Self-pay | Admitting: Physician Assistant

## 2023-06-04 VITALS — BP 108/70 | HR 101 | Ht 64.0 in | Wt 189.0 lb

## 2023-06-04 DIAGNOSIS — R002 Palpitations: Secondary | ICD-10-CM | POA: Insufficient documentation

## 2023-06-04 DIAGNOSIS — Z79899 Other long term (current) drug therapy: Secondary | ICD-10-CM | POA: Insufficient documentation

## 2023-06-04 DIAGNOSIS — I2510 Atherosclerotic heart disease of native coronary artery without angina pectoris: Secondary | ICD-10-CM | POA: Diagnosis not present

## 2023-06-04 DIAGNOSIS — I471 Supraventricular tachycardia, unspecified: Secondary | ICD-10-CM | POA: Insufficient documentation

## 2023-06-04 MED ORDER — ATORVASTATIN CALCIUM 40 MG PO TABS: 40.0000 mg | ORAL_TABLET | Freq: Every day | ORAL | 3 refills | Status: DC

## 2023-06-04 NOTE — Progress Notes (Signed)
 Cardiology Office Note    Date:  06/04/2023   ID:  Courtney Wilcox, DOB Oct 10, 1945, MRN [id number]  PCP:  Delmus Ferri, MD  Cardiologist:  Sammy Crisp, MD  Electrophysiologist:  Boyce Byes, MD   Chief Complaint: Hospital follow up  History of Present Illness:   Courtney Wilcox is a 78 y.o. female with history of CAD status post PCI/DES to the LAD in 09/2022, SVT, pulmonary embolism in 11/2020, rheumatoid arthritis, CKD stage III, macrocytic anemia, thyroid  nodules, and OSA who presents for hospital follow-up as outlined below.  She was previously followed by [facility] cardiology.  She has since established care with [facility] and has been followed general cardiology and EP for management of increasing palpitation burden with known PSVT.  Echo in 10/2021 showed an EF of 60 to 65%, no regional wall motion abnormalities, mild LVH, grade 2 diastolic dysfunction, normal RV systolic function and ventricular cavity size, severely dilated left atrium, and mild mitral regurgitation.  With regards to her palpitations, she underwent Zio patch in 04/2022 that showed 25,709 episodes of SVT lasting up to 40 minutes and 12 seconds with a maximum rate of 222 bpm.  Previously on beta-blocker, though this was transitioned to Cardizem  with subsequent titration without significant improvement in symptom burden.  Management of SVT has been limited by underlying pulmonary disease and medication interactions with Celexa .  She was seen in 09/2022 with increasing dyspnea and chest tightness and transferred to the ED where she ruled out.  CT of the chest was negative for PE with aortic atherosclerosis, mild coronary calcification, and multiple thyroid  nodules noted.  Echo showed an EF of 55 to 60%, no regional wall motion underlies, grade 1 diastolic dysfunction, normal RV systolic function and ventricular cavity size, mildly dilated left atrium, tricuspid aortic valve, and an estimated right  atrial pressure of 3 mmHg.  R/LHC in 09/2022 showed severe one-vessel CAD involving the ostial/proximal LAD estimated 85%.  RHC was attempted, though aborted due to severe disease involving the right brachial vein and inability to advance the catheter.  Frequent SVT was noted throughout the case which made angiography difficult.  Staged PCI was recommended as well as improvement in SVT burden leading the patient to be started on amiodarone , however she did not tolerate this.  She subsequently underwent staged PCI on 10/02/2022 to the ostial/proximal LAD.  The lesion was very eccentric and fibrotic in the proximal segment leading to 10% residual stenosis following PCI.  In the setting of elevated liver function testing in 02/2023, she was taken off Tylenol  and atorvastatin .  More recently, liver function has normalized.  Has not yet resumed atorvastatin .  She was admitted to [facility] in 04/2023 with acute hypoxic respiratory failure due to COVID-pneumonia and complicated by demand ischemia, tachyarrhythmia with frequent ectopy, acute on CKD, and acute on chronic macrocytic anemia.  Unable to exclude A-fib on EKG upon arrival with telemetry subsequently showing sinus tachycardia with PACs.  High-sensitivity troponin peaked at 351.  CTA chest negative for PE.  Echo showed an EF of 60 to 65%, no regional wall motion abnormalities, mild LVH, normal RV systolic function, ventricular cavity size, and RVSP, aortic valve sclerosis without evidence of stenosis, and an estimated right atrial pressure of 3 mmHg.  Outpatient cardiac monitoring remains pending at this time.  She comes in accompanied by her daughter today and continues to note intermittent shortness of breath with associated tachypalpitations with heart rates ranging into the 130s to  150s bpm at times.  In this setting she has also been off Dupixent, though has recently restarted this.  She does not always note any improvement in her shortness of breath with her  inhalers.  Just completed Zio patch earlier today and will be mailing this back after today's appointment.  Has follow-up with EP next month for further evaluation of SVT burden.  Remains on Cardizem  240 mg daily.  No presyncope or frank syncope.  No falls or symptoms concerning for bleeding.   Labs independently reviewed: 05/2023 - potassium 3.9, BUN 13, serum creatinine 1.3, Hgb 10, PLT 174 04/2023 - albumin 3.4, AST/ALT normal, TSH normal 02/2023 - TC 177, TG 105, HDL 84, LDL 72, A1c 5  Past Medical History:  Diagnosis Date   Allergic rhinitis    Anxiety    Arthritis    Asthma    Atrophic vaginitis    B12 deficiency    Bronchitis, chronic (HCC)    CAD (coronary artery disease)    a. 03/2003 Cath: Nl cors; b. 09/2022 Staged PCI: LM nl, LAD 85p/25m (3.5x26 Onyx Frontier DES), RI small, mild dzs, LCX nl, OM2 mild dzs, RCA 30p.   Cervical disc disease    CKD (chronic kidney disease), stage III (HCC)    Collagen vascular disease (HCC)    RA   Cough - post-COVID    Depression    Diastolic dysfunction    a. 11/2020 Echo: EF 45-50%, GrI DD; b. 10/2021 Echo: EF 60-65%, GrII DD; c. 09/2022 Echo: EF 55-60%, no rwma, GrI DD, nl RV fxn, mildly dil LA. No significant valvular dzs.   Fibrocystic breast disease    Foot ulcer (HCC)    GERD (gastroesophageal reflux disease)    Hammer toe    Hyperlipidemia    IBS (irritable bowel syndrome)    Macrocytic anemia    Macular degeneration 2023   Obesity    OSA (obstructive sleep apnea)    Osteoarthrosis    Multi site   Osteopenia    PSVT (paroxysmal supraventricular tachycardia) (HCC)    a. 04/2022 Zio: Sinus rhythm @ 57 (42-101). Rare PACs/PVCs. [address] SVT runs lasting up to 21m 12sec - max rate 222. 5 beats NSVT. Triggered events = sinus, SVT, PACs.   Rheumatoid arthritis (HCC)    Shingles    Thyroid  nodule    MULTIPLE   Tonsillitis    recurrent   Umbilical hernia     Past Surgical History:  Procedure Laterality Date   BACK SURGERY  2011    LAMINECTOMY   BREAST BIOPSY Right    neg   CERVICAL FUSION  2011, 2012   X 2   COLONOSCOPY  2005, 2015   COLONOSCOPY WITH PROPOFOL  N/A 08/04/2020   Procedure: COLONOSCOPY WITH PROPOFOL ;  Surgeon: Marshall Skeeter, MD;  Location: [facility] ENDOSCOPY;  Service: Endoscopy;  Laterality: N/A;   CORONARY STENT INTERVENTION N/A 10/02/2022   Procedure: CORONARY STENT INTERVENTION;  Surgeon: Wenona Hamilton, MD;  Location: [facility] INVASIVE CV LAB;  Service: Cardiovascular;  Laterality: N/A;   ESOPHAGOGASTRODUODENOSCOPY (EGD) WITH PROPOFOL  N/A 09/26/2021   Procedure: ESOPHAGOGASTRODUODENOSCOPY (EGD) WITH PROPOFOL ;  Surgeon: Quintin Buckle, DO;  Location: [facility] ENDOSCOPY;  Service: Gastroenterology;  Laterality: N/A;   ESOPHAGOGASTRODUODENOSCOPY ENDOSCOPY  2005, 2013, 2015   EYE SURGERY Bilateral    Cataract Extraction with IOL    HERNIA REPAIR     JOINT REPLACEMENT     KNEE ARTHROPLASTY Left 07/12/2016   Procedure: COMPUTER ASSISTED TOTAL KNEE ARTHROPLASTY;  Surgeon: Arlyne Lame, MD;  Location: [facility] ORS;  Service: Orthopedics;  Laterality: Left;   Laryngeal tear after intubation w/repair     LUMBAR DISC SURGERY  12/2009   REPLACEMENT TOTAL KNEE Right 06/2007   DR. HOOTEN, [facility]   REPLACEMENT UNICONDYLAR JOINT KNEE     DR. HOOTEN, [facility]   RIGHT/LEFT HEART CATH AND CORONARY ANGIOGRAPHY N/A 09/29/2022   Procedure: RIGHT/LEFT HEART CATH AND CORONARY ANGIOGRAPHY;  Surgeon: Wenona Hamilton, MD;  Location: [facility] INVASIVE CV LAB;  Service: Cardiovascular;  Laterality: N/A;   SHOULDER ARTHROSCOPY WITH ROTATOR CUFF REPAIR Right    TONSILLECTOMY     TOTAL HIP ARTHROPLASTY Right 01/31/2017   Procedure: TOTAL HIP ARTHROPLASTY;  Surgeon: Arlyne Lame, MD;  Location: [facility] ORS;  Service: Orthopedics;  Laterality: Right;   TUBAL LIGATION     UPPER GI ENDOSCOPY      Current Medications: Current Meds  Medication Sig   acetaminophen  (TYLENOL ) 500 MG tablet Take 1,000 mg by mouth 3 (three) times daily as  needed for mild pain.    ALPRAZolam  (XANAX ) 0.5 MG tablet Take 1 tablet (0.5 mg total) by mouth at bedtime as needed for sleep or anxiety.   Cholecalciferol  (VITAMIN D ) 2000 units tablet Take 2,000 Units by mouth daily.   clopidogrel  (PLAVIX ) 75 MG tablet TAKE 1 TABLET BY MOUTH DAILY WITH BREAKFAST.   Cyanocobalamin 1000 MCG/ML KIT Inject as directed every 30 (thirty) days.   diltiazem  (CARDIZEM  CD) 240 MG 24 hr capsule Take 1 capsule (240 mg total) by mouth daily.   Dupilumab 300 MG/2ML SOPN Inject 4 mLs into the skin every 14 (fourteen) days.   EPINEPHrine 0.3 mg/0.3 mL IJ SOAJ injection Inject 0.3 mg into the muscle as needed.   famotidine  (PEPCID ) 20 MG tablet Take 20 mg by mouth daily as needed.   Fluticasone -Umeclidin-Vilant 100-62.5-25 MCG/ACT AEPB Inhale into the lungs. Inhale 1 Puff into the lungs once daily   gabapentin  (NEURONTIN ) 300 MG capsule Take 300 mg by mouth at bedtime.   leflunomide  (ARAVA ) 10 MG tablet Take 10 mg by mouth daily.   levalbuterol  (XOPENEX ) 0.63 MG/3ML nebulizer solution Take 0.63 mg by nebulization every 6 (six) hours as needed for wheezing.   meclizine (ANTIVERT) 12.5 MG tablet PLEASE SEE ATTACHED FOR DETAILED DIRECTIONS   montelukast  (SINGULAIR ) 10 MG tablet Take 10 mg by mouth daily as needed (for allergies.).    nystatin  (MYCOSTATIN ) 100000 UNIT/ML suspension Take 5 mLs by mouth as needed.   pantoprazole  (PROTONIX ) 40 MG tablet Take 1 tablet (40 mg total) by mouth 2 (two) times daily. (Patient taking differently: Take 40 mg by mouth daily.)   potassium chloride  SA (KLOR-CON  M) 20 MEQ tablet Take 20 mEq by mouth.   predniSONE  (DELTASONE ) 2.5 MG tablet Take 2 tablets (5 mg total) by mouth daily with breakfast. Resume after finishing high doses of prednisone    sertraline  (ZOLOFT ) 25 MG tablet Take 50 mg by mouth daily. 50 mg total   Simethicone  180 MG CAPS Take 1-2 capsules by mouth as needed.   sodium bicarbonate  650 MG tablet Take 1 tablet (650 mg total) by  mouth 2 (two) times daily.   triamcinolone  (NASACORT ) 55 MCG/ACT AERO nasal inhaler Place 2 sprays into the nose daily.   Vonoprazan Fumarate (VOQUEZNA) 10 MG TABS Take 10 mg by mouth.   ZENPEP 25000-79000 units CPEP Take by mouth. Occasionally takes with a large meal    Allergies:   Amiodarone , Lactose, Other, Biaxin [clarithromycin], Hydrocodone -acetaminophen , Oxycodone,  Sulfa antibiotics, and Sulfasalazine   Social History   Socioeconomic History   Marital status: Married    Spouse name: Ambrosio Junker   Number of children: 3   Years of education: Not on file   Highest education level: Not on file  Occupational History   Not on file  Tobacco Use   Smoking status: Never   Smokeless tobacco: Never  Vaping Use   Vaping status: Never Used  Substance and Sexual Activity   Alcohol use: No   Drug use: No   Sexual activity: Not on file  Other Topics Concern   Not on file  Social History Narrative   Not on file   Social Drivers of Health   Financial Resource Strain: Low Risk  (03/14/2023)   Received from [facility]   Overall Financial Resource Strain (CARDIA)    Difficulty of Paying Living Expenses: Not hard at all  Food Insecurity: No Food Insecurity (04/25/2023)   Hunger Vital Sign    Worried About Running Out of Food in the Last Year: Never true    Ran Out of Food in the Last Year: Never true  Transportation Needs: No Transportation Needs (04/25/2023)   PRAPARE - Administrator, Civil Service (Medical): No    Lack of Transportation (Non-Medical): No  Physical Activity: Not on file  Stress: Not on file  Social Connections: Moderately Isolated (04/25/2023)   Social Connection and Isolation Panel [NHANES]    Frequency of Communication with Friends and Family: More than three times a week    Frequency of Social Gatherings with Friends and Family: More than three times a week    Attends Religious Services: More than 4 times per year    Active Member of [employer]  or Organizations: No    Attends Banker Meetings: Never    Marital Status: Widowed     Family History:  The patient's family history includes Asthma in her mother; Breast cancer (age of onset: 38) in her mother; Coronary artery disease (age of onset: 3) in her father; Emphysema in her father; Lung cancer in her father.  ROS:   12-point review of systems is negative unless otherwise noted in the HPI.   EKGs/Labs/Other Studies Reviewed:    Studies reviewed were summarized above. The additional studies were reviewed today:  Zio patch 04/2023: Pending __________  2D echo 04/26/2023: 1. Left ventricular ejection fraction, by estimation, is 60 to 65%. The  left ventricle has normal function. The left ventricle has no regional  wall motion abnormalities. There is mild left ventricular hypertrophy.  Left ventricular diastolic parameters  are indeterminate.   2. Right ventricular systolic function is normal. The right ventricular  size is normal. There is normal pulmonary artery systolic pressure. The  estimated right ventricular systolic pressure is 23.6 mmHg.   3. The mitral valve is normal in structure. No evidence of mitral valve  regurgitation. No evidence of mitral stenosis.   4. The aortic valve is normal in structure. Aortic valve regurgitation is  not visualized. Aortic valve sclerosis is present, with no evidence of  aortic valve stenosis.   5. The inferior vena cava is normal in size with greater than 50%  respiratory variability, suggesting right atrial pressure of 3 mmHg.  __________  LHC 10/02/2022:   Prox LAD lesion is 85% stenosed.   Prox LAD to Mid LAD lesion is 50% stenosed.   Prox RCA lesion is 30% stenosed.   A drug-eluting stent was  successfully placed using a STENT ONYX FRONTIER 3.5X26.   Post intervention, there is a 10% residual stenosis.   Post intervention, there is a 0% residual stenosis.   Successful drug-eluting stent placement to the  ostial/proximal LAD.  The lesion is very eccentric and fibrotic in the proximal segment and thus there was a 10% residual stenosis and spite of high-pressure noncompliant balloon.   Recommendations: Dual antiplatelet therapy for at least 6 months. Aggressive treatment of risk factors. Hydrate overnight and check renal function in the morning.  68 mL of contrast was used for the procedure. __________   [facility] 09/29/2022:   Prox RCA lesion is 30% stenosed.   Prox LAD lesion is 85% stenosed.   Prox LAD to Mid LAD lesion is 50% stenosed.   1.  Severe one-vessel coronary artery disease involving the ostial/proximal LAD 2.  Left ventricular angiography was not performed.  EF was normal by echo.  Normal left ventricular end-diastolic pressure. 3.  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 4.  Frequent supraventricular tachycardia throughout the case which made angiography difficult.   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  In addition, SVT has to be well-controlled before the procedure and thus I elected to start the patient on oral amiodarone  as we are not able to increase diltiazem  due to resting bradycardia. Given that the patient's pulmonary embolism was more than a year ago and she will require dual antiplatelet therapy, I favor not resuming Eliquis  especially that recent CTA showed no evidence of pulmonary embolism.  I started aspirin  and clopidogrel . The echo from today did not show evidence of pulmonary hypertension and thus no need to attempt right heart catheterization at this time. __________   Zio patch 04/2022:   The patient was monitored for 14 days.   The predominant rhythm was sinus with an average rate of 57 bpm  while in sinus rhythm (range 42-101 bpm).   There were rare PACs and PVCs.   25,709 episode of supraventricular tachycardia cardia occurred, lasting up to 40:12 minutes with a maximum rate of 222 bpm.   A single 5 beat run of nonsustained ventricular tachycardia was observed with a maximum rate of 174 bpm.   There was no prolonged pause.   Patient triggered events corresponded to sinus rhythm, SVT, and PACs.   Sinus rhythm with numerous episodes of SVT lasting up to 40 minutes, as detailed above.  Rare PACs and PVCs as well as a single brief run of NSVT also noted. __________   2D echo 10/31/2021: 1. Left ventricular ejection fraction, by estimation, is 60 to 65%. The  left ventricle has normal function. The left ventricle has no regional  wall motion abnormalities. There is mild left ventricular hypertrophy.  Left ventricular diastolic parameters  are consistent with Grade II diastolic dysfunction (pseudonormalization).   2. Right ventricular systolic function is normal. The right ventricular  size is normal.   3. Left atrial size was severely dilated.   4. The mitral valve is normal in structure. Mild mitral valve  regurgitation.   5. The aortic valve is tricuspid. Aortic valve regurgitation is not  visualized.   6. The inferior vena cava is normal in size with greater than 50%  respiratory variability,  suggesting right atrial pressure of 3 mmHg.  __________   2D echo 11/20/2020: 1. Left ventricular ejection fraction, by estimation, is 45 to 50%. The  left ventricle has mildly decreased function. The left ventricle  demonstrates global hypokinesis. The left ventricular internal cavity size  was mildly to moderately dilated. There  is mild concentric left ventricular hypertrophy. Left ventricular  diastolic parameters are consistent with Grade I diastolic dysfunction  (impaired relaxation).   2. Right ventricular systolic function is mildly reduced. The right  ventricular size is  normal.   3. Left atrial size was severely dilated.   4. Right atrial size was moderately dilated.   5. The mitral valve is grossly normal. Mild to moderate mitral valve  regurgitation.   6. Tricuspid valve regurgitation is mild to moderate.   7. The aortic valve is calcified. Aortic valve regurgitation is not  visualized. Mild aortic valve sclerosis is present, with no evidence of  aortic valve stenosis.    EKG:  EKG is ordered today.  The EKG ordered today demonstrates sinus tachycardia with PACs, 101 bpm, left axis deviation, poor R wave progression along the precordial leads, nonspecific ST-T changes  Recent Labs: 09/27/2022: B Natriuretic Peptide 241.2; Magnesium  1.8 04/26/2023: ALT 28; TSH 0.649 04/27/2023: BUN 31; Creatinine, Ser 1.52; Potassium 3.8; Sodium 139 05/21/2023: Hemoglobin 10.0; Platelets 174  Recent Lipid Panel No results found for: "CHOL", "TRIG", "HDL", "CHOLHDL", "VLDL", "LDLCALC", "LDLDIRECT"  PHYSICAL EXAM:    VS:  BP 108/70   Pulse (!) 101   Ht 5\' 4"  (1.626 m)   Wt 189 lb (85.7 kg)   SpO2 96%   BMI 32.44 kg/m   BMI: Body mass index is 32.44 kg/m.  Physical Exam Vitals reviewed.  Constitutional:      Appearance: She is well-developed.  HENT:     Head: Normocephalic and atraumatic.  Eyes:     General:        Right eye: No discharge.        Left eye: No discharge.  Cardiovascular:     Rate and Rhythm: Normal rate and regular rhythm.     Heart sounds: Normal heart sounds, S1 normal and S2 normal. Heart sounds not distant. No midsystolic click and no opening snap. No murmur heard.    No friction rub.  Pulmonary:     Effort: Pulmonary effort is normal. No respiratory distress.     Breath sounds: Normal breath sounds. No decreased breath sounds, wheezing, rhonchi or rales.  Chest:     Chest wall: No tenderness.  Musculoskeletal:     Cervical back: Normal range of motion.  Skin:    General: Skin is warm and dry.     Nails: There is no clubbing.   Neurological:     Mental Status: She is alert and oriented to person, place, and time.  Psychiatric:        Speech: Speech normal.        Behavior: Behavior normal.        Thought Content: Thought content normal.        Judgment: Judgment normal.     Wt Readings from Last 3 Encounters:  06/04/23 189 lb (85.7 kg)  05/24/23 193 lb (87.5 kg)  04/24/23 193 lb 12.6 oz (87.9 kg)     ASSESSMENT & PLAN:   CAD involving the native coronary arteries without angina: No symptoms suggestive of angina or cardiac decompensation.  Recent elevated troponin felt to be supply/demand ischemia in the setting of  underlying pulmonary illness and paroxysms of SVT.  Echo with preserved LV systolic function.  Could consider noninvasive ischemic testing with stress testing following improvement in tachyarrhythmia burden.  Remains on clopidogrel  75 mg daily.  With normalization of liver function testing, we will undergo a rechallenge of lower dose atorvastatin  40 mg with a follow-up LFT 1 month thereafter to ensure stable liver function.  SVT with frequent PACs/palpitations: More recently, she has noted and increase in tachyarrhythmia of burden.  I suspect this is contributing to some of her underlying intermittent dyspnea.  Some of this may be in the setting of acute pulmonary illness, though nonetheless she did have over 25,000 episodes of SVT on outpatient cardiac monitoring in 2024 lasting up to 40 minutes.  Has previously been intolerant to beta-blocker and amiodarone .  Remains on Cardizem  CD 240 mg daily.  Relative hypotension precludes escalation at this time.  Repeat Zio patch is pending at this time.  She will follow-up with EP once Zio patch has resulted for ongoing management of tachyarrhythmia burden.     Disposition: F/u with Dr. Nolan Battle or an APP in 6 months, and EP as directed.    Medication Adjustments/Labs and Tests Ordered: Current medicines are reviewed at length with the patient today.  Concerns  regarding medicines are outlined above. Medication changes, Labs and Tests ordered today are summarized above and listed in the Patient Instructions accessible in Encounters.   Signed, Varney Gentleman, PA-C 06/04/2023 5:26 PM     [facility] [place] [address] [place], [place] [address] [phone]

## 2023-06-04 NOTE — Patient Instructions (Signed)
 Medication Instructions:  Your physician recommends the following medication changes.  START TAKING: Atorvastatin  (Lipitor ) 40 mg by mouth daily  *If you need a refill on your cardiac medications before your next appointment, please call your pharmacy*  Lab Work: Your provider would like for you to return in 1 month to have the following labs drawn: LFT.   Please go to [facility] [address] ([facility] [address], [place] [address] You do not need an appointment.  They are open from 8 am- 4:30 pm.  Lunch from 1:00 pm- 2:00 pm You will not need to be fasting.   Testing/Procedures: No test ordered today   Follow-Up: At [facility], you and your health needs are our priority.  As part of our continuing mission to provide you with exceptional heart care, our providers are all part of one team.  This team includes your primary Cardiologist (physician) and Advanced Practice Providers or APPs (Physician Assistants and Nurse Practitioners) who all work together to provide you with the care you need, when you need it.  Your next appointment:   6 month(s)  Provider:   You may see Sammy Crisp, MD or one of the following Advanced Practice Providers on your designated Care Team:   Laneta Pintos, NP Gildardo Labrador, PA-C Varney Gentleman, PA-C Cadence Doran, PA-C Ronald Cockayne, NP Morey Ar, NP    We recommend signing up for the patient portal called "MyChart".  Sign up information is provided on this After Visit Summary.  MyChart is used to connect with patients for Virtual Visits (Telemedicine).  Patients are able to view lab/test results, encounter notes, upcoming appointments, etc.  Non-urgent messages can be sent to your provider as well.   To learn more about what you can do with MyChart, go to [email or web address].

## 2023-06-08 ENCOUNTER — Other Ambulatory Visit: Payer: Self-pay | Admitting: Nurse Practitioner

## 2023-06-13 ENCOUNTER — Other Ambulatory Visit: Payer: Self-pay | Admitting: Physician Assistant

## 2023-06-13 ENCOUNTER — Ambulatory Visit
Admission: RE | Admit: 2023-06-13 | Discharge: 2023-06-13 | Disposition: A | Discharge status: Home / Self Care | Source: Ambulatory Visit | Attending: Physician Assistant | Admitting: Physician Assistant

## 2023-06-13 ENCOUNTER — Other Ambulatory Visit: Payer: Self-pay

## 2023-06-13 ENCOUNTER — Emergency Department
Admission: EM | Admit: 2023-06-13 | Discharge: 2023-06-14 | Disposition: A | Discharge status: Home / Self Care | Attending: Emergency Medicine | Admitting: Emergency Medicine

## 2023-06-13 ENCOUNTER — Ambulatory Visit: Admitting: Cardiology

## 2023-06-13 DIAGNOSIS — R1031 Right lower quadrant pain: Secondary | ICD-10-CM

## 2023-06-13 DIAGNOSIS — N183 Chronic kidney disease, stage 3 unspecified: Secondary | ICD-10-CM | POA: Diagnosis not present

## 2023-06-13 DIAGNOSIS — Z8616 Personal history of COVID-19: Secondary | ICD-10-CM | POA: Diagnosis not present

## 2023-06-13 DIAGNOSIS — R7989 Other specified abnormal findings of blood chemistry: Secondary | ICD-10-CM

## 2023-06-13 DIAGNOSIS — N390 Urinary tract infection, site not specified: Secondary | ICD-10-CM | POA: Insufficient documentation

## 2023-06-13 DIAGNOSIS — U071 COVID-19: Secondary | ICD-10-CM | POA: Insufficient documentation

## 2023-06-13 DIAGNOSIS — R1030 Lower abdominal pain, unspecified: Secondary | ICD-10-CM | POA: Diagnosis present

## 2023-06-13 DIAGNOSIS — Z7902 Long term (current) use of antithrombotics/antiplatelets: Secondary | ICD-10-CM | POA: Diagnosis not present

## 2023-06-13 DIAGNOSIS — Z96641 Presence of right artificial hip joint: Secondary | ICD-10-CM | POA: Diagnosis not present

## 2023-06-13 DIAGNOSIS — R17 Unspecified jaundice: Secondary | ICD-10-CM | POA: Insufficient documentation

## 2023-06-13 DIAGNOSIS — J45909 Unspecified asthma, uncomplicated: Secondary | ICD-10-CM | POA: Insufficient documentation

## 2023-06-13 DIAGNOSIS — R1032 Left lower quadrant pain: Secondary | ICD-10-CM | POA: Insufficient documentation

## 2023-06-13 DIAGNOSIS — R7401 Elevation of levels of liver transaminase levels: Secondary | ICD-10-CM | POA: Diagnosis not present

## 2023-06-13 DIAGNOSIS — R748 Abnormal levels of other serum enzymes: Secondary | ICD-10-CM | POA: Insufficient documentation

## 2023-06-13 DIAGNOSIS — Z96651 Presence of right artificial knee joint: Secondary | ICD-10-CM | POA: Diagnosis not present

## 2023-06-13 DIAGNOSIS — I2510 Atherosclerotic heart disease of native coronary artery without angina pectoris: Secondary | ICD-10-CM | POA: Diagnosis not present

## 2023-06-13 LAB — COMPREHENSIVE METABOLIC PANEL WITH GFR
ALT: 166 U/L — ABNORMAL HIGH (ref 0–44)
AST: 95 U/L — ABNORMAL HIGH (ref 15–41)
Albumin: 3.4 g/dL — ABNORMAL LOW (ref 3.5–5.0)
Alkaline Phosphatase: 321 U/L — ABNORMAL HIGH (ref 38–126)
Anion gap: 8 (ref 5–15)
BUN: 19 mg/dL (ref 8–23)
CO2: 23 mmol/L (ref 22–32)
Calcium: 9.5 mg/dL (ref 8.9–10.3)
Chloride: 106 mmol/L (ref 98–111)
Creatinine, Ser: 1.55 mg/dL — ABNORMAL HIGH (ref 0.44–1.00)
GFR, Estimated: 35 mL/min — ABNORMAL LOW (ref >60)
Glucose, Bld: 118 mg/dL — ABNORMAL HIGH (ref 70–99)
Potassium: 3.8 mmol/L (ref 3.5–5.1)
Sodium: 137 mmol/L (ref 135–145)
Total Bilirubin: 3.2 mg/dL — ABNORMAL HIGH (ref 0.0–1.2)
Total Protein: 6.2 g/dL — ABNORMAL LOW (ref 6.5–8.1)

## 2023-06-13 LAB — CBC
HCT: 39.1 % (ref 36.0–46.0)
Hemoglobin: 12.5 g/dL (ref 12.0–15.0)
MCH: 33.2 pg (ref 26.0–34.0)
MCHC: 32 g/dL (ref 30.0–36.0)
MCV: 103.7 fL — ABNORMAL HIGH (ref 80.0–100.0)
Platelets: 149 10*3/uL — ABNORMAL LOW (ref 150–400)
RBC: 3.77 MIL/uL — ABNORMAL LOW (ref 3.87–5.11)
RDW: 15.1 % (ref 11.5–15.5)
WBC: 6.3 10*3/uL (ref 4.0–10.5)
nRBC: 0 % (ref 0.0–0.2)

## 2023-06-13 LAB — LIPASE, BLOOD: Lipase: 92 U/L — ABNORMAL HIGH (ref 11–51)

## 2023-06-13 MED ORDER — ONDANSETRON HCL 4 MG/2ML IJ SOLN
4.0000 mg | Freq: Once | INTRAMUSCULAR | Status: AC
  Administered 2023-06-14: 4 mg via INTRAVENOUS
  Filled 2023-06-13: qty 2

## 2023-06-13 MED ORDER — IOHEXOL 300 MG/ML  SOLN
80.0000 mL | Freq: Once | INTRAMUSCULAR | Status: AC | PRN
  Administered 2023-06-13: 80 mL via INTRAVENOUS

## 2023-06-13 MED ORDER — IOHEXOL 300 MG/ML  SOLN
100.0000 mL | Freq: Once | INTRAMUSCULAR | Status: DC | PRN
Start: 2023-06-13 — End: 2023-06-13

## 2023-06-13 MED ORDER — MORPHINE SULFATE (PF) 2 MG/ML IV SOLN
2.0000 mg | Freq: Once | INTRAVENOUS | Status: DC
  Filled 2023-06-13: qty 1

## 2023-06-13 MED ORDER — SODIUM CHLORIDE 0.9 % IV BOLUS (SEPSIS)
500.0000 mL | Freq: Once | INTRAVENOUS | Status: AC
  Administered 2023-06-14: 500 mL via INTRAVENOUS

## 2023-06-13 NOTE — ED Provider Notes (Signed)
 [facility] Provider Note    Event Date/Time   First MD Initiated Contact with Patient 06/13/23 2303     (approximate)   History   Nausea   HPI  Courtney Wilcox is a 78 y.o. female with history of coronary artery disease, chronic kidney disease, diastolic dysfunction, hyperlipidemia, rheumatoid arthritis who presents emergency department with complaints of mid to lower abdominal pain that she describes as a pressure, nausea, yellowing of her skin.  States she is being monitored by her PCP and gastroenterology for elevated liver function tests which have been present intermittently since February 2025.  Initially was thought secondary to atorvastatin  80 mg which she had stopped.  Her doctor resumed atorvastatin  40 mg 5 days ago due to improvement of her liver function tests and because she has a coronary artery stent.  She states that she saw her outpatient primary care provider today and had lab work and a CT of her abdomen pelvis and was instructed to come to the emergency department.  Patient denies alcohol use.  She states she has not been using Tylenol .  Denies prior abdominal surgery.  Patient also reports cough, congestion today and states that she tested positive for COVID-19.   History provided by patient, daughter.    Past Medical History:  Diagnosis Date   Allergic rhinitis    Anxiety    Arthritis    Asthma    Atrophic vaginitis    B12 deficiency    Bronchitis, chronic (HCC)    CAD (coronary artery disease)    a. 03/2003 Cath: Nl cors; b. 09/2022 Staged PCI: LM nl, LAD 85p/93m (3.5x26 Onyx Frontier DES), RI small, mild dzs, LCX nl, OM2 mild dzs, RCA 30p.   Cervical disc disease    CKD (chronic kidney disease), stage III (HCC)    Collagen vascular disease (HCC)    RA   Cough - post-COVID    Depression    Diastolic dysfunction    a. 11/2020 Echo: EF 45-50%, GrI DD; b. 10/2021 Echo: EF 60-65%, GrII DD; c. 09/2022 Echo: EF 55-60%, no rwma, GrI  DD, nl RV fxn, mildly dil LA. No significant valvular dzs.   Fibrocystic breast disease    Foot ulcer (HCC)    GERD (gastroesophageal reflux disease)    Hammer toe    Hyperlipidemia    IBS (irritable bowel syndrome)    Macrocytic anemia    Macular degeneration 2023   Obesity    OSA (obstructive sleep apnea)    Osteoarthrosis    Multi site   Osteopenia    PSVT (paroxysmal supraventricular tachycardia) (HCC)    a. 04/2022 Zio: Sinus rhythm @ 57 (42-101). Rare PACs/PVCs. 44010 SVT runs lasting up to 79m 12sec - max rate 222. 5 beats NSVT. Triggered events = sinus, SVT, PACs.   Rheumatoid arthritis (HCC)    Shingles    Thyroid  nodule    MULTIPLE   Tonsillitis    recurrent   Umbilical hernia     Past Surgical History:  Procedure Laterality Date   BACK SURGERY  2011   LAMINECTOMY   BREAST BIOPSY Right    neg   CERVICAL FUSION  2011, 2012   X 2   COLONOSCOPY  2005, 2015   COLONOSCOPY WITH PROPOFOL  N/A 08/04/2020   Procedure: COLONOSCOPY WITH PROPOFOL ;  Surgeon: Marshall Skeeter, MD;  Location: [facility] ENDOSCOPY;  Service: Endoscopy;  Laterality: N/A;   CORONARY STENT INTERVENTION N/A 10/02/2022   Procedure: CORONARY STENT INTERVENTION;  Surgeon: Wenona Hamilton, MD;  Location: [facility] INVASIVE CV LAB;  Service: Cardiovascular;  Laterality: N/A;   ESOPHAGOGASTRODUODENOSCOPY (EGD) WITH PROPOFOL  N/A 09/26/2021   Procedure: ESOPHAGOGASTRODUODENOSCOPY (EGD) WITH PROPOFOL ;  Surgeon: Quintin Buckle, DO;  Location: [facility]COPY;  Service: Gastroenterology;  Laterality: N/A;   ESOPHAGOGASTRODUODENOSCOPY ENDOSCOPY  2005, 2013, 2015   EYE SURGERY Bilateral    Cataract Extraction with IOL    HERNIA REPAIR     JOINT REPLACEMENT     KNEE ARTHROPLASTY Left 07/12/2016   Procedure: COMPUTER ASSISTED TOTAL KNEE ARTHROPLASTY;  Surgeon: Arlyne Lame, MD;  Location: [facility] ORS;  Service: Orthopedics;  Laterality: Left;   Laryngeal tear after intubation w/repair     LUMBAR DISC SURGERY   12/2009   REPLACEMENT TOTAL KNEE Right 06/2007   DR. HOOTEN, [facility]   REPLACEMENT UNICONDYLAR JOINT KNEE     DR. HOOTEN, [facility]   RIGHT/LEFT HEART CATH AND CORONARY ANGIOGRAPHY N/A 09/29/2022   Procedure: RIGHT/LEFT HEART CATH AND CORONARY ANGIOGRAPHY;  Surgeon: Wenona Hamilton, MD;  Location: [facility] INVASIVE CV LAB;  Service: Cardiovascular;  Laterality: N/A;   SHOULDER ARTHROSCOPY WITH ROTATOR CUFF REPAIR Right    TONSILLECTOMY     TOTAL HIP ARTHROPLASTY Right 01/31/2017   Procedure: TOTAL HIP ARTHROPLASTY;  Surgeon: Arlyne Lame, MD;  Location: [facility] ORS;  Service: Orthopedics;  Laterality: Right;   TUBAL LIGATION     UPPER GI ENDOSCOPY      MEDICATIONS:  Prior to Admission medications   Medication Sig Start Date End Date Taking? Authorizing Provider  acetaminophen  (TYLENOL ) 500 MG tablet Take 1,000 mg by mouth 3 (three) times daily as needed for mild pain.     [provider]  ALPRAZolam  (XANAX ) 0.5 MG tablet Take 1 tablet (0.5 mg total) by mouth at bedtime as needed for sleep or anxiety. 10/03/22   Lorita Rosa, MD  atorvastatin  (LIPITOR ) 40 MG tablet Take 1 tablet (40 mg total) by mouth daily. 06/04/23   Roark Chick, PA-C  Cholecalciferol  (VITAMIN D ) 2000 units tablet Take 2,000 Units by mouth daily.    [provider]  clopidogrel  (PLAVIX ) 75 MG tablet TAKE 1 TABLET BY MOUTH DAILY WITH BREAKFAST. 06/08/23   Florette Hurry, NP  Cyanocobalamin 1000 MCG/ML KIT Inject as directed every 30 (thirty) days.    [provider]  diltiazem  (CARDIZEM  CD) 240 MG 24 hr capsule Take 1 capsule (240 mg total) by mouth daily. 11/09/22   Riddle, Suzann, NP  Dupilumab 300 MG/2ML SOPN Inject 4 mLs into the skin every 14 (fourteen) days. 12/15/21   [provider]  EPINEPHrine 0.3 mg/0.3 mL IJ SOAJ injection Inject 0.3 mg into the muscle as needed. 11/29/21   [provider]  famotidine  (PEPCID ) 20 MG tablet Take 20 mg by mouth daily as needed.     [provider]  Fluticasone -Umeclidin-Vilant 100-62.5-25 MCG/ACT AEPB Inhale into the lungs. Inhale 1 Puff into the lungs once daily 12/12/22   [provider]  gabapentin  (NEURONTIN ) 300 MG capsule Take 300 mg by mouth at bedtime.    [provider]  hydroxychloroquine  (PLAQUENIL ) 200 MG tablet Take 1 tablet (200 mg total) by mouth daily. Patient not taking: Reported on 04/25/2023 07/21/16   Arnulfo Bidding, PA  leflunomide  (ARAVA ) 10 MG tablet Take 10 mg by mouth daily. 06/30/20   [provider]  levalbuterol  (XOPENEX ) 0.63 MG/3ML nebulizer solution Take 0.63 mg by nebulization every 6 (six) hours as needed for wheezing. 04/25/21  [provider]  meclizine (ANTIVERT) 12.5 MG tablet PLEASE SEE ATTACHED FOR DETAILED DIRECTIONS 10/05/18   [provider]  montelukast  (SINGULAIR ) 10 MG tablet Take 10 mg by mouth daily as needed (for allergies.).     [provider]  nystatin  (MYCOSTATIN ) 100000 UNIT/ML suspension Take 5 mLs by mouth as needed. 06/21/21   [provider]  pantoprazole  (PROTONIX ) 40 MG tablet Take 1 tablet (40 mg total) by mouth 2 (two) times daily. Patient taking differently: Take 40 mg by mouth daily. 10/03/22   Lorita Rosa, MD  potassium chloride  SA (KLOR-CON  M) 20 MEQ tablet Take 20 mEq by mouth. 05/11/23 05/10/24  [provider]  predniSONE  (DELTASONE ) 2.5 MG tablet Take 2 tablets (5 mg total) by mouth daily with breakfast. Resume after finishing high doses of prednisone  04/27/23   Luna Salinas, MD  sertraline  (ZOLOFT ) 25 MG tablet Take 50 mg by mouth daily. 50 mg total 10/16/22 10/16/23  [provider]  Simethicone  180 MG CAPS Take 1-2 capsules by mouth as needed.    [provider]  sodium bicarbonate  650 MG tablet Take 1 tablet (650 mg total) by mouth 2 (two) times daily. 04/27/23   Amin, Sumayya, MD  triamcinolone  (NASACORT ) 55 MCG/ACT AERO nasal inhaler Place 2 sprays into the nose  daily.    [provider]  Vonoprazan Fumarate (VOQUEZNA) 10 MG TABS Take 10 mg by mouth. 05/15/23   [provider]  ZENPEP 25000-79000 units CPEP Take by mouth. Occasionally takes with a large meal 01/20/22   [provider]    Physical Exam   Triage Vital Signs: ED Triage Vitals  Encounter Vitals Group     BP 06/13/23 2116 (!) 120/90     Systolic BP Percentile --      Diastolic BP Percentile --      Pulse Rate 06/13/23 2116 75     Resp 06/13/23 2116 18     Temp 06/13/23 2116 98.6 F (37 C)     Temp Source 06/13/23 2116 Oral     SpO2 06/13/23 2116 95 %     Weight 06/13/23 2115 189 lb 9.5 oz (86 kg)     Height 06/13/23 2115 5\' 4"  (1.626 m)     Head Circumference --      Peak Flow --      Pain Score 06/13/23 2115 4     Pain Loc --      Pain Education --      Exclude from Growth Chart --     Most recent vital signs: Vitals:   06/14/23 0500 06/14/23 0545  BP: (!) 149/90   Pulse: (!) 50   Resp:    Temp:  99.2 F (37.3 C)  SpO2: 99%     CONSTITUTIONAL: Alert, responds appropriately to questions. Well-appearing; well-nourished HEAD: Normocephalic, atraumatic EYES: Conjunctivae clear, pupils appear equal, sclera nonicteric ENT: normal nose; moist mucous membranes NECK: Supple, normal ROM CARD: RRR; S1 and S2 appreciated RESP: Normal chest excursion without splinting or tachypnea; breath sounds clear and equal bilaterally; no wheezes, no rhonchi, no rales, no hypoxia or respiratory distress, speaking full sentences ABD/GI: Non-distended; soft, mild tenderness diffusely, no guarding or rebound BACK: The back appears normal EXT: Normal ROM in all joints; no deformity noted, no edema SKIN: Mild jaundice in appearance, no rash on exposed skin NEURO: Moves all extremities equally, normal speech PSYCH: The patient's mood and manner are appropriate.   ED Results / Procedures / Treatments  LABS: (all labs ordered are listed, but only abnormal  results are displayed) Labs Reviewed  RESP PANEL BY RT-PCR (RSV, FLU A&B, COVID)  RVPGX2 - Abnormal; Notable for the following components:      Result Value   SARS Coronavirus 2 by RT PCR POSITIVE (*)    All other components within normal limits  LIPASE, BLOOD - Abnormal; Notable for the following components:   Lipase 92 (*)    All other components within normal limits  COMPREHENSIVE METABOLIC PANEL WITH GFR - Abnormal; Notable for the following components:   Glucose, Bld 118 (*)    Creatinine, Ser 1.55 (*)    Total Protein 6.2 (*)    Albumin 3.4 (*)    AST 95 (*)    ALT 166 (*)    Alkaline Phosphatase 321 (*)    Total Bilirubin 3.2 (*)    GFR, Estimated 35 (*)    All other components within normal limits  CBC - Abnormal; Notable for the following components:   RBC 3.77 (*)    MCV 103.7 (*)    Platelets 149 (*)    All other components within normal limits  URINALYSIS, ROUTINE W REFLEX MICROSCOPIC - Abnormal; Notable for the following components:   Color, Urine YELLOW (*)    APPearance CLEAR (*)    Specific Gravity, Urine 1.042 (*)    Leukocytes,Ua TRACE (*)    Bacteria, UA RARE (*)    All other components within normal limits  URINE CULTURE  PROTIME-INR  HEPATITIS PANEL, ACUTE     EKG:  EKG Interpretation Date/Time:    Ventricular Rate:    PR Interval:    QRS Duration:    QT Interval:    QTC Calculation:   R Axis:      Text Interpretation:           RADIOLOGY: My personal review and interpretation of imaging: Chest x-ray clear.  MRI pending.  I have personally reviewed all radiology reports.   DG Chest Portable 1 View Result Date: 06/14/2023 CLINICAL DATA:  Cough, congestion, and elevated liver enzymes. EXAM: PORTABLE CHEST 1 VIEW COMPARISON:  Portable chest 04/25/2023 FINDINGS: The heart is enlarged but unchanged. There is no finding of acute CHF. The mediastinum is normally outlined. There is calcification in the transverse aorta. The lungs are clear.  Osteopenia and degenerative change again noted thoracic spine with right acromiohumeral abutment consistent with chronic rotator cuff arthropathy. Anterior and posterior fusion hardware partially visible in the lower C-spine. IMPRESSION: No evidence of acute chest disease. Stable chest with cardiomegaly. Electronically Signed   By: Denman Fischer M.D.   On: 06/14/2023 00:39   CT ABDOMEN PELVIS W CONTRAST Result Date: 06/13/2023 CLINICAL DATA:  Lower abdominal and umbilical pain for 2 weeks nausea EXAM: CT ABDOMEN AND PELVIS WITH CONTRAST TECHNIQUE: Multidetector CT imaging of the abdomen and pelvis was performed using the standard protocol following bolus administration of intravenous contrast. RADIATION DOSE REDUCTION: This exam was performed according to the departmental dose-optimization program which includes automated exposure control, adjustment of the mA and/or kV according to patient size and/or use of iterative reconstruction technique. CONTRAST:  80mL OMNIPAQUE  IOHEXOL  300 MG/ML  SOLN COMPARISON:  CT 04/26/2023, 06/25/2020 FINDINGS: Lower chest: Lung bases demonstrate no acute airspace disease. Cardiomegaly. Coronary vascular calcification. Small hiatal hernia. Hepatobiliary: No focal liver abnormality is seen. No gallstones, gallbladder wall thickening, or biliary dilatation. Pancreas: Unremarkable. No pancreatic ductal dilatation or surrounding inflammatory changes. Spleen: Normal in size without focal  abnormality. Adrenals/Urinary Tract: Adrenal glands are within normal limits. Kidneys show no hydronephrosis. The bladder is unremarkable Stomach/Bowel: The stomach is nonenlarged. No dilated small bowel. No acute bowel wall thickening. Negative appendix. Diverticular disease of the left colon Vascular/Lymphatic: Mild atherosclerosis. 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 No suspicious lymph nodes. Reproductive: Partially obscured. Uterus and adnexa are grossly  unremarkable Other: Negative for pelvic effusion or free air. Moderate fat containing periumbilical hernia Musculoskeletal: No acute osseous abnormality IMPRESSION: 1. No CT evidence for acute intra-abdominal or pelvic abnormality. 2. Diverticular disease of the left colon without acute inflammatory process. 3. Moderate fat containing periumbilical hernia. 4. Cardiomegaly. 5. Small hiatal hernia. Electronically Signed   By: Esmeralda Hedge M.D.   On: 06/13/2023 18:38     PROCEDURES:  Critical Care performed: No     .1-3 Lead EKG Interpretation  Performed by: Faith Patricelli, Clover Dao, DO Authorized by: Isyss Espinal, Clover Dao, DO     Interpretation: abnormal     ECG rate:  88   ECG rate assessment: normal     Rhythm: sinus rhythm     Ectopy: none     Conduction: normal       IMPRESSION / MDM / ASSESSMENT AND PLAN / ED COURSE  I reviewed the triage vital signs and the nursing notes.    Patient here for concerns for elevated liver function test, abdominal pain.  The patient is on the cardiac monitor to evaluate for evidence of arrhythmia and/or significant heart rate changes.   DIFFERENTIAL DIAGNOSIS (includes but not limited to):   Choledocholithiasis, cholecystitis, cholangitis, pancreatitis, hepatitis, side effects from atorvastatin , UTI, viral URI, pneumonia   Patient's presentation is most consistent with acute presentation with potential threat to life or bodily function.   PLAN: Will obtain abdominal labs, MRCP, urine.  Will also test for COVID-19, influenza, RSV and obtain chest x-ray.  Will give IV fluids, pain and nausea medicine.  CT scan of the abdomen pelvis from earlier today reviewed and interpreted by myself and the radiologist and shows no acute abnormality.   MEDICATIONS GIVEN IN ED: Medications  morphine  (PF) 2 MG/ML injection 2 mg (0 mg Intravenous Hold 06/14/23 0227)  sodium chloride  0.9 % bolus 500 mL (0 mLs Intravenous Stopped 06/14/23 0243)  ondansetron  (ZOFRAN )  injection 4 mg (4 mg Intravenous Given 06/14/23 0018)  gadobutrol (GADAVIST) 1 MMOL/ML injection 9 mL (9 mLs Intravenous Contrast Given 06/14/23 0122)  cefTRIAXone  (ROCEPHIN ) 1 g in sodium chloride  0.9 % 100 mL IVPB (0 g Intravenous Stopped 06/14/23 0524)     ED COURSE: Labs show no leukocytosis.  Alkaline phosphatase of 321, AST 95, ALT 166, total bilirubin 3.2.  Lipase is 92.  MRI official read pending but did discuss the case with Dr. Micah Ade with radiology.  He does not see any cholecystitis or choledocholithiasis.  Patient does have some pyuria and bacteria in combination with her lower abdominal pressure this could be UTI.  Culture pending.  Will give Rocephin  here.  She is positive for COVID-19.  Chest x-ray reviewed and interpreted by myself and the radiologist and shows no acute abnormality.  No hypoxia here.  Occasionally tachycardic with coughing but this will improve into the 80s at rest.  Discussed results with patient and daughter.  Have offered admission for observation but patient feels comfortable with plan for discharge home and close follow-up with her PCP.  Will discuss with pharmacist regarding starting patient on Paxlovid versus molnupiravir.  4:26  AM  Discussed with pharmacist.  Patient not a candidate for paxlovid with renal and liver dysfunction.  Will start molnupiravir.  Does not need to be renally dosed.   At this time, I do not feel there is any life-threatening condition present. I reviewed all nursing notes, vitals, pertinent previous records.  All lab and urine results, EKGs, imaging ordered have been independently reviewed and interpreted by myself.  I reviewed all available radiology reports from any imaging ordered this visit.  Based on my assessment, I feel the patient is safe to be discharged home without further emergent workup and can continue workup as an outpatient as needed. Discussed all findings, treatment plan as well as usual and customary return  precautions.  They verbalize understanding and are comfortable with this plan.  Outpatient follow-up has been provided as needed.  All questions have been answered.    CONSULTS: Offered admission but patient declined.  She will follow-up with her outpatient provider.   OUTSIDE RECORDS REVIEWED: Reviewed recent PCP notes.       FINAL CLINICAL IMPRESSION(S) / ED DIAGNOSES   Final diagnoses:  Elevated liver function tests  COVID-19  Acute UTI     Rx / DC Orders   ED Discharge Orders          Ordered    cephALEXin (KEFLEX) 500 MG capsule  2 times daily        06/14/23 0427    ondansetron  (ZOFRAN -ODT) 4 MG disintegrating tablet  Every 6 hours PRN        06/14/23 0427    molnupiravir EUA (LAGEVRIO) 200 mg CAPS capsule  2 times daily        06/14/23 0433             Note:  This document was prepared using Dragon voice recognition software and may include unintentional dictation errors.   Donovyn Guidice, Clover Dao, DO 06/14/23 [phone]

## 2023-06-13 NOTE — ED Triage Notes (Addendum)
 Pt reports recently she has had elevated liver enzymes, pt was seen at PCP earlier today and pt states her labs have continued to get worse. Pt had CT done at 1800 as outpatient earlier tonight and was called by PCP to come to ER. Pt appears to have some yellowing to her skin and reports some nausea.

## 2023-06-14 ENCOUNTER — Emergency Department

## 2023-06-14 DIAGNOSIS — N390 Urinary tract infection, site not specified: Secondary | ICD-10-CM | POA: Diagnosis not present

## 2023-06-14 LAB — HEPATITIS PANEL, ACUTE
HCV Ab: NONREACTIVE
Hep A IgM: NONREACTIVE
Hep B C IgM: NONREACTIVE
Hepatitis B Surface Ag: NONREACTIVE

## 2023-06-14 LAB — URINALYSIS, ROUTINE W REFLEX MICROSCOPIC
Bilirubin Urine: NEGATIVE
Glucose, UA: NEGATIVE mg/dL
Hgb urine dipstick: NEGATIVE
Ketones, ur: NEGATIVE mg/dL
Nitrite: NEGATIVE
Protein, ur: NEGATIVE mg/dL
Specific Gravity, Urine: 1.042 — ABNORMAL HIGH (ref 1.005–1.030)
pH: 5 (ref 5.0–8.0)

## 2023-06-14 LAB — RESP PANEL BY RT-PCR (RSV, FLU A&B, COVID)  RVPGX2
Influenza A by PCR: NEGATIVE
Influenza B by PCR: NEGATIVE
Resp Syncytial Virus by PCR: NEGATIVE
SARS Coronavirus 2 by RT PCR: POSITIVE — AB

## 2023-06-14 LAB — PROTIME-INR
INR: 0.9 (ref 0.8–1.2)
Prothrombin Time: 12.5 s (ref 11.4–15.2)

## 2023-06-14 MED ORDER — MOLNUPIRAVIR EUA 200MG CAPSULE: 4.0000 | ORAL_CAPSULE | Freq: Two times a day (BID) | ORAL | 0 refills | Status: AC

## 2023-06-14 MED ORDER — SODIUM CHLORIDE 0.9 % IV SOLN
1.0000 g | Freq: Once | INTRAVENOUS | Status: AC
  Administered 2023-06-14: 1 g via INTRAVENOUS
  Filled 2023-06-14: qty 10

## 2023-06-14 MED ORDER — ONDANSETRON 4 MG PO TBDP
4.0000 mg | ORAL_TABLET | Freq: Four times a day (QID) | ORAL | 0 refills | Status: DC | PRN
Start: 2023-06-14 — End: 2023-08-16

## 2023-06-14 MED ORDER — CEPHALEXIN 500 MG PO CAPS: 500.0000 mg | ORAL_CAPSULE | Freq: Two times a day (BID) | ORAL | 0 refills | Status: DC

## 2023-06-14 MED ORDER — GADOBUTROL 1 MMOL/ML IV SOLN
9.0000 mL | Freq: Once | INTRAVENOUS | Status: AC | PRN
  Administered 2023-06-14: 9 mL via INTRAVENOUS

## 2023-06-14 NOTE — Discharge Instructions (Addendum)
 Please continue to avoid alcohol, Tylenol .  MRI showed no choledocholithiasis (gallstone in a duct) or cholecystitis (infected gallbladder).  You may follow-up on the official reads from this study through MyChart.  I recommend taking your antibiotic (cephalexin) for your UTI until complete and your antiviral medication (molnupiravir) for your COVID-19 until complete.  We have discussed this with pharmacy given your liver dysfunction, chronic kidney disease.  These medications will be safe to take.  You are not a candidate for Paxlovid.

## 2023-06-14 NOTE — ED Notes (Signed)
 Pt refused morphine  at this time and states she might need it later but does not feel like she needs it at the moment.

## 2023-06-16 LAB — URINE CULTURE: Culture: 10000 — AB

## 2023-06-18 ENCOUNTER — Ambulatory Visit: Payer: Self-pay | Admitting: Nurse Practitioner

## 2023-06-18 DIAGNOSIS — R002 Palpitations: Secondary | ICD-10-CM

## 2023-06-26 NOTE — Progress Notes (Deleted)
  Electrophysiology Office Follow up Visit Note:    Date:  06/26/2023   ID:  Courtney Wilcox, DOB Feb 15, 1945, MRN [id number]  PCP:  Delmus Ferri, PA  [facility] Cardiologist:  Sammy Crisp, MD  [facility] Electrophysiologist:  Boyce Byes, MD    Interval History:     Courtney Wilcox is a 78 y.o. female who presents for a follow up visit.   Last seen in EP by Courtney Wilcox 01/05/2023. Has a history of CAD s/p PCI, SVT (AT), DVT/PE. Also with severe CAD s/p prior PCI to the LAD. Was on amiodarone  around the time of PCI due to runs of SVT during LHC. Because of nausea, amio was stopped.   She has seen cardiology multiple times recently for follow up. She reported palpitations and a heart monitor was ordered. The monitor showed a high burden of SVT with rates over 200 bpm at times.        Past medical, surgical, social and family history were reviewed.  ROS:   Please see the history of present illness.    All other systems reviewed and are negative.  EKGs/Labs/Other Studies Reviewed:    The following studies were reviewed today:  06/18/2023 zio  The patient was monitored for 14 days.   The predominant rhythm was sinus with an average rate of 75 bpm in sinus (range 53-102 bpm in sinus).   There were occasional PACs and rare PVCs.   13,844 episodes of supraventricular tachyardia occurred, lasing up to 29 minutes, 37 seconds with a maximum rate of 214 bpm.   No prolonged pause was observed.   Patient triggered events correspond to sinus rhythm, SVT, PACs, and PVCs.  04/26/2023 Echo EF 60       Physical Exam:    VS:  There were no vitals taken for this visit.    Wt Readings from Last 3 Encounters:  06/13/23 189 lb 9.5 oz (86 kg)  06/04/23 189 lb (85.7 kg)  05/24/23 193 lb (87.5 kg)     GEN: no distress CARD: RRR, No MRG RESP: No IWOB. CTAB.      ASSESSMENT:    No diagnosis found. PLAN:    In order of problems listed  above:  #SVT #AT Likely driven in part by her severe lung disease. Previously failed amiodarone  Not a candidate for Class IC 2/2 severe CAD Her kidney function makes her a poor candidate for dofetilide.  For now, I have recommended continuing with the diltiazem . Will increase the dose and split it to twice daily for more steady coverage throughout the day.  She will follow up in 6 months.       Signed, Harvie Liner, MD, [facility] 06/26/2023 8:49 PM    Electrophysiology [facility]

## 2023-06-27 ENCOUNTER — Ambulatory Visit: Attending: Cardiology | Admitting: Cardiology

## 2023-06-27 ENCOUNTER — Ambulatory Visit: Admitting: Cardiology

## 2023-06-27 VITALS — BP 120/64 | HR 69 | Ht 64.0 in | Wt 188.0 lb

## 2023-06-27 DIAGNOSIS — I4719 Other supraventricular tachycardia: Secondary | ICD-10-CM | POA: Insufficient documentation

## 2023-06-27 DIAGNOSIS — I471 Supraventricular tachycardia, unspecified: Secondary | ICD-10-CM | POA: Insufficient documentation

## 2023-06-27 MED ORDER — DILTIAZEM HCL ER COATED BEADS 180 MG PO CP24: 180.0000 mg | ORAL_CAPSULE | Freq: Two times a day (BID) | ORAL | 3 refills | Status: DC

## 2023-06-27 NOTE — Patient Instructions (Signed)
 Medication Instructions:  Your physician has recommended you make the following change in your medication:  1) INCREASE Cardizem  to 180 mg twice daily  *If you need a refill on your cardiac medications before your next appointment, please call your pharmacy*  Follow-Up: At [facility], you and your health needs are our priority.  As part of our continuing mission to provide you with exceptional heart care, our providers are all part of one team.  This team includes your primary Cardiologist (physician) and Advanced Practice Providers or APPs (Physician Assistants and Nurse Practitioners) who all work together to provide you with the care you need, when you need it.  Your next appointment:   6 months  Provider:   Suzann Riddle, NP

## 2023-06-27 NOTE — Progress Notes (Signed)
  Electrophysiology Office Follow up Visit Note:    Date:  06/27/2023   ID:  Courtney Wilcox, DOB [birth year], MRN [id number]  PCP:  Delmus Ferri, PA  [facility] Cardiologist:  Sammy Crisp, MD  [facility] Electrophysiologist:  Boyce Byes, MD    Interval History:     Courtney Wilcox is a 78 y.o. female who presents for a follow up visit.   Last seen in EP by Suzann 01/05/2023. Has a history of CAD s/p PCI, SVT (AT), DVT/PE. Also with severe CAD s/p prior PCI to the LAD. Was on amiodarone  around the time of PCI due to runs of SVT during LHC. Because of nausea, amio was stopped.   She has seen cardiology multiple times recently for follow up. She reported palpitations and a heart monitor was ordered. The monitor showed a high burden of SVT with rates over 200 bpm at times.  She is with her daughter today in clinic.  She recently lost her husband in March of this year.  This was somewhat unexpected.  She has been doing okay.  Her breathing is fairly stable right now actually.  She does feel her heart race at times but most of the time she think she is relatively asymptomatic with this.  She had COVID 2 weeks ago.      Past medical, surgical, social and family history were reviewed.  ROS:   Please see the history of present illness.    All other systems reviewed and are negative.  EKGs/Labs/Other Studies Reviewed:    The following studies were reviewed today:  06/18/2023 zio  The patient was monitored for 14 days.   The predominant rhythm was sinus with an average rate of 75 bpm in sinus (range 53-102 bpm in sinus).   There were occasional PACs and rare PVCs.   13,844 episodes of supraventricular tachyardia occurred, lasing up to 29 minutes, 37 seconds with a maximum rate of 214 bpm.   No prolonged pause was observed.   Patient triggered events correspond to sinus rhythm, SVT, PACs, and PVCs.  04/26/2023 Echo EF 60       Physical Exam:    VS:  BP  120/64 (BP Location: Left Arm)   Pulse 69   Ht 5' 4 (1.626 m)   Wt 188 lb (85.3 kg)   SpO2 96%   BMI 32.27 kg/m     Wt Readings from Last 3 Encounters:  06/27/23 188 lb (85.3 kg)  06/13/23 189 lb 9.5 oz (86 kg)  06/04/23 189 lb (85.7 kg)     GEN: no distress CARD: RRR, No MRG.   RESP: No IWOB. CTAB.Prolonged expiratory phase.  Expiratory wheezing.      ASSESSMENT:    1. SVT (supraventricular tachycardia) (HCC)   2. Atrial tachycardia (HCC)    PLAN:    In order of problems listed above:  #SVT #AT Likely driven in part by her severe lung disease. Previously failed amiodarone  Not a candidate for Class IC 2/2 severe CAD Her kidney function makes her a poor candidate for dofetilide.  For now, I have recommended continuing with the diltiazem . Will increase the dose and split it to twice daily for more steady coverage throughout the day.  Continue diltiazem  180 mg by mouth twice daily  She will follow up in 6 months with an APP.       Signed, Harvie Liner, MD, [facility], [facility] 06/27/2023 3:55 PM    Electrophysiology [facility]

## 2023-08-10 ENCOUNTER — Other Ambulatory Visit: Payer: Self-pay

## 2023-08-10 ENCOUNTER — Encounter: Payer: Self-pay | Admitting: Emergency Medicine

## 2023-08-10 ENCOUNTER — Emergency Department
Admission: EM | Admit: 2023-08-10 | Discharge: 2023-08-11 | Disposition: A | Discharge status: Home / Self Care | Attending: Emergency Medicine | Admitting: Emergency Medicine

## 2023-08-10 ENCOUNTER — Other Ambulatory Visit: Payer: Self-pay | Admitting: Gastroenterology

## 2023-08-10 ENCOUNTER — Ambulatory Visit
Admission: RE | Admit: 2023-08-10 | Discharge: 2023-08-10 | Disposition: A | Discharge status: Home / Self Care | Source: Ambulatory Visit | Attending: Gastroenterology | Admitting: Gastroenterology

## 2023-08-10 DIAGNOSIS — R1032 Left lower quadrant pain: Secondary | ICD-10-CM

## 2023-08-10 DIAGNOSIS — K439 Ventral hernia without obstruction or gangrene: Secondary | ICD-10-CM | POA: Diagnosis not present

## 2023-08-10 DIAGNOSIS — R109 Unspecified abdominal pain: Secondary | ICD-10-CM | POA: Diagnosis present

## 2023-08-10 DIAGNOSIS — R1033 Periumbilical pain: Secondary | ICD-10-CM | POA: Diagnosis present

## 2023-08-10 LAB — CBC
HCT: 36 % (ref 36.0–46.0)
Hemoglobin: 12 g/dL (ref 12.0–15.0)
MCH: 33.9 pg (ref 26.0–34.0)
MCHC: 33.3 g/dL (ref 30.0–36.0)
MCV: 101.7 fL — ABNORMAL HIGH (ref 80.0–100.0)
Platelets: 204 K/uL (ref 150–400)
RBC: 3.54 MIL/uL — ABNORMAL LOW (ref 3.87–5.11)
RDW: 14.3 % (ref 11.5–15.5)
WBC: 6.4 K/uL (ref 4.0–10.5)
nRBC: 0 % (ref 0.0–0.2)

## 2023-08-10 LAB — COMPREHENSIVE METABOLIC PANEL WITH GFR
ALT: 25 U/L (ref 0–44)
AST: 34 U/L (ref 15–41)
Albumin: 3.2 g/dL — ABNORMAL LOW (ref 3.5–5.0)
Alkaline Phosphatase: 93 U/L (ref 38–126)
Anion gap: 11 (ref 5–15)
BUN: 13 mg/dL (ref 8–23)
CO2: 25 mmol/L (ref 22–32)
Calcium: 9.2 mg/dL (ref 8.9–10.3)
Chloride: 106 mmol/L (ref 98–111)
Creatinine, Ser: 1.43 mg/dL — ABNORMAL HIGH (ref 0.44–1.00)
GFR, Estimated: 38 mL/min — ABNORMAL LOW (ref >60)
Glucose, Bld: 119 mg/dL — ABNORMAL HIGH (ref 70–99)
Potassium: 3.9 mmol/L (ref 3.5–5.1)
Sodium: 142 mmol/L (ref 135–145)
Total Bilirubin: 0.7 mg/dL (ref 0.0–1.2)
Total Protein: 5.8 g/dL — ABNORMAL LOW (ref 6.5–8.1)

## 2023-08-10 LAB — LIPASE, BLOOD: Lipase: 32 U/L (ref 11–51)

## 2023-08-10 MED ORDER — HYDROCODONE-ACETAMINOPHEN 5-325 MG PO TABS: 1.0000 | ORAL_TABLET | Freq: Four times a day (QID) | ORAL | 0 refills | Status: AC | PRN

## 2023-08-10 MED ORDER — HYDROCODONE-ACETAMINOPHEN 5-325 MG PO TABS
1.0000 | ORAL_TABLET | Freq: Once | ORAL | Status: AC
  Administered 2023-08-10: 1 via ORAL
  Filled 2023-08-10: qty 1

## 2023-08-10 MED ORDER — ONDANSETRON HCL 4 MG PO TABS: 4.0000 mg | ORAL_TABLET | Freq: Four times a day (QID) | ORAL | 0 refills | Status: AC | PRN

## 2023-08-10 MED ORDER — ONDANSETRON HCL 4 MG/2ML IJ SOLN: 4.0000 mg | Freq: Once | INTRAMUSCULAR | Status: DC | PRN

## 2023-08-10 NOTE — ED Provider Notes (Signed)
 [facility] Provider Note    Event Date/Time   First MD Initiated Contact with Patient 08/10/23 2154     (approximate)   History   Abdominal Pain and Abnormal CT Scan   HPI  Courtney Wilcox is a 78 year old female with history of GERD, distant hernia repair, SVT/atrial tachycardia presenting to the emergency department for evaluation of abdominal pain.  Patient was seen yesterday by GI for left lower quadrant pain.  She had suspected diverticulitis, had worsening symptoms today for which she had an outpatient CT performed.  She was notified that this was concerning for possible hernia with complication and was directed to the ER.  Patient reports she has primarily been having pain in her left lower quadrant, but yesterday did feel like her hernia over her stomach was more enlarged, improved today.  Does still have some mild discomfort over this area.  Does report some nausea.  No fevers.      Physical Exam   Triage Vital Signs: ED Triage Vitals  Encounter Vitals Group     BP 08/10/23 2048 126/89     Girls Systolic BP Percentile --      Girls Diastolic BP Percentile --      Boys Systolic BP Percentile --      Boys Diastolic BP Percentile --      Pulse Rate 08/10/23 2048 (!) 140     Resp 08/10/23 2048 18     Temp 08/10/23 2048 98.3 F (36.8 C)     Temp Source 08/10/23 2048 Oral     SpO2 08/10/23 2048 96 %     Weight 08/10/23 2050 185 lb (83.9 kg)     Height 08/10/23 2050 5' 4 (1.626 m)     Head Circumference --      Peak Flow --      Pain Score 08/10/23 2049 5     Pain Loc --      Pain Education --      Exclude from Growth Chart --     Most recent vital signs: Vitals:   08/10/23 2313 08/10/23 2330  BP:  (!) 140/88  Pulse: 83 75  Resp:  (!) 24  Temp:    SpO2: 100% 100%     General: Awake, interactive  CV:  Intermittent episodes of tachycardia followed by normal heart rate, good peripheral perfusion Resp:  Unlabored respirations.   Abd:  Nondistended, soft, palpable periumbilical/ventral hernia on exam.  I am able to reduce a significant portion of the hernia, but I am not able to tell if the hernia is fully reduced on exam, no overlying skin changes Neuro:  Symmetric facial movement, fluid speech   ED Results / Procedures / Treatments   Labs (all labs ordered are listed, but only abnormal results are displayed) Labs Reviewed  COMPREHENSIVE METABOLIC PANEL WITH GFR - Abnormal; Notable for the following components:      Result Value   Glucose, Bld 119 (*)    Creatinine, Ser 1.43 (*)    Total Protein 5.8 (*)    Albumin 3.2 (*)    GFR, Estimated 38 (*)    All other components within normal limits  CBC - Abnormal; Notable for the following components:   RBC 3.54 (*)    MCV 101.7 (*)    All other components within normal limits  LIPASE, BLOOD     EKG EKG independently reviewed and interpreted by myself demonstrates:    RADIOLOGY Imaging independently reviewed and  interpreted by myself demonstrates:  Outpatient CT abdomen pelvis reviewed demonstrating ventral abdominal hernia with associated fat stranding as well as colonic diverticulosis without obvious findings of acute diverticulitis on noncontrast study  Formal Radiology Read:  CT ABDOMEN PELVIS WO CONTRAST Result Date: 08/10/2023 CLINICAL DATA:  Abdominal pain, periumbilical and left lower quadrant. EXAM: CT ABDOMEN AND PELVIS WITHOUT CONTRAST TECHNIQUE: Multidetector CT imaging of the abdomen and pelvis was performed following the standard protocol without IV contrast. RADIATION DOSE REDUCTION: This exam was performed according to the departmental dose-optimization program which includes automated exposure control, adjustment of the mA and/or kV according to patient size and/or use of iterative reconstruction technique. COMPARISON:  Multiple priors including MRI Jun 14, 2023 and CT Jun 13 2023 FINDINGS: Lower chest: Bibasilar atelectasis/scarring.  Small  hiatal hernia. Hepatobiliary: Unremarkable noncontrast enhanced appearance of the hepatic parenchyma. Gallbladder is distended without wall thickening or pericholecystic fluid. No biliary ductal dilation. Pancreas: No pancreatic ductal dilation or evidence of acute inflammation. Spleen: No splenomegaly. Adrenals/Urinary Tract: No suspicious adrenal nodule/mass. No hydronephrosis. No renal, ureteral or bladder calculi. Urinary bladder is unremarkable for degree of distension. Stomach/Bowel: No radiopaque enteric contrast mature was administered. Stomach is minimally distended limiting evaluation. No pathologic dilation of small or large bowel. No evidence of acute bowel inflammation. Colonic diverticulosis without findings of acute diverticulitis. Vascular/Lymphatic: Aortic atherosclerosis. Normal caliber abdominal aorta. No pathologically enlarged abdominal or pelvic lymph nodes. Reproductive: Uterus and bilateral adnexa are unremarkable. Other: Increased fat stranding about the ventral abdominal hernia which contains fat and a nonobstructed portion of bowel with a 2.1 cm aperture width. Musculoskeletal: Right hip arthroplasty without evidence of hardware complication. Multilevel degenerative changes spine. Diffuse demineralization of bone. Fatty muscular atrophy. IMPRESSION: 1. Increased fat stranding about the ventral abdominal hernia which contains fat and a nonobstructed portion of bowel with a 2.1 cm aperture width. Suggest clinical correlation for reducibility. 2. Colonic diverticulosis without findings of acute diverticulitis. 3. Small hiatal hernia. 4.  Aortic Atherosclerosis (ICD10-I70.0). Electronically Signed   By: Reyes Holder M.D.   On: 08/10/2023 14:51    PROCEDURES:  Critical Care performed: No  Procedures   MEDICATIONS ORDERED IN ED: Medications  ondansetron  (ZOFRAN ) injection 4 mg (has no administration in time range)  HYDROcodone -acetaminophen  (NORCO/VICODIN) 5-325 MG per tablet 1  tablet (1 tablet Oral Given 08/10/23 2329)     IMPRESSION / MDM / ASSESSMENT AND PLAN / ED COURSE  I reviewed the triage vital signs and the nursing notes.  Differential diagnosis includes, but is not limited to, incarcerated hernia, strangulated hernia, hernia without complication, diverticulitis, other acute intra-abdominal process  Patient's presentation is most consistent with acute presentation with potential threat to life or bodily function.  78 year old female presenting with abdominal pain found to have ventral hernia on outpatient CT.  Labs performed from triage here with normal white blood cell count, stable mild renal impairment.  Normal lipase.  I did review patient's CT scan.  On exam, patient has a soft periumbilical/ventral hernia.  Not significantly tender to palpation.  I was able to reduce a portion of the patient's hernia, but in looking at her CT I am not sure if a portion of her hernia remains on reduced.  I did discuss the patient's case with Dr. Marinda with general surgery.  He notes that even if a small portion of patient's hernia is not reduced, she does not have a urgent need for surgical intervention.  In looking at the hernia on CT, it  is also likely that the bowel has been reduced as this was in the more proximal portion of her hernia.  With this, he did feel that patient could be discharged with plans for outpatient follow-up.  I did discuss this with the patient.  She is agreeable with this plan.  Will DC with prescription for Zofran  and short course of pain medication.  Does have documented allergy to hydrocodone  and oxycodone, but I cannot see that she has received Norco within the last year.  Discussed this with the patient, reports that with frequent use has some poor tolerance, but tolerates intermittent use.  Strict return precautions provided.  Patient discharged in stable condition.      FINAL CLINICAL IMPRESSION(S) / ED DIAGNOSES   Final diagnoses:  Ventral  hernia without obstruction or gangrene     Rx / DC Orders   ED Discharge Orders          Ordered    ondansetron  (ZOFRAN ) 4 MG tablet  Every 6 hours PRN        08/10/23 2358    HYDROcodone -acetaminophen  (NORCO/VICODIN) 5-325 MG tablet  Every 6 hours PRN        08/10/23 2358             Note:  This document was prepared using Dragon voice recognition software and may include unintentional dictation errors.   Levander Slate, MD 08/11/23 0000

## 2023-08-10 NOTE — ED Triage Notes (Signed)
 Pt in via POV reports seeing GI today for ongoing abdominal pain x weeks but worsening over the last 4 days; was sent for Abdominal CT scan today, called w/ results tonight to be evaluated further in ED.  Per daughter, CT was concerning for umbilical hernia being intertwined w/ intestine.    In addition to abdominal pain, patient does report some nausea and diarrhea but denies vomiting.  Has been on PO antibiotics x 2 days for possible Diverticulitis prior to CT scan today.  Also reports low grade fever last night and today.     Patient tachycardic w/ hx of same; see's EP for that.  Other vitals WDL at this time.

## 2023-08-10 NOTE — Discharge Instructions (Addendum)
 You were seen in the ER today for evaluation of your abdominal pain.  You do have a hernia on exam that we were able to partially or fully reduce.  We discussed your case with our general surgeon, Dr. Marinda, and you fortunately do not need emergency surgery.  However, please arrange close outpatient follow-up in the office as you may still need operative repair.  I sent a prescription for nausea and pain medicine to your pharmacy that you can take as needed.  The pain medication can make you drowsy, do not drive or operate machinery when taking this.  Please return to the ER for new or worsening symptoms including worsening abdominal pain, development of a firm area of swelling over your abdomen, repeated vomiting, or any other new or concerning symptoms.

## 2023-08-16 ENCOUNTER — Encounter: Payer: Self-pay | Admitting: Oncology

## 2023-08-16 ENCOUNTER — Telehealth: Payer: Self-pay

## 2023-08-16 ENCOUNTER — Telehealth: Payer: Self-pay | Admitting: Internal Medicine

## 2023-08-16 ENCOUNTER — Encounter: Payer: Self-pay | Admitting: General Surgery

## 2023-08-16 ENCOUNTER — Ambulatory Visit (INDEPENDENT_AMBULATORY_CARE_PROVIDER_SITE_OTHER): Admitting: General Surgery

## 2023-08-16 VITALS — BP 148/79 | HR 80 | Temp 98.0°F | Ht 64.0 in | Wt 190.8 lb

## 2023-08-16 DIAGNOSIS — K439 Ventral hernia without obstruction or gangrene: Secondary | ICD-10-CM | POA: Diagnosis not present

## 2023-08-16 NOTE — Telephone Encounter (Signed)
Faxed pulmonary clearance to Dr. Fuad Aleskerov at [phone]. 

## 2023-08-16 NOTE — Telephone Encounter (Signed)
   Pre-operative Risk Assessment    Patient Name: Courtney Wilcox  DOB: 02-22-1945 MRN: [id number]   Date of last office visit: unknown Date of next office visit: unknown   Request for Surgical Clearance    Procedure:  Robotic ventral hernia repair  Date of Surgery:  Clearance TBD                                Surgeon:  Dr. Jayson endow Surgeon's Group or Practice Name:  [facility] Phone number:  [phone] Fax number:  [phone]   Type of Clearance Requested:   - Pharmacy:  Hold Clopidogrel  (Plavix ) when to stop Plavix ?   Type of Anesthesia:  General    Additional requests/questions:    Signed, Berwyn LELON Sprung   08/16/2023, 1:45 PM

## 2023-08-16 NOTE — Telephone Encounter (Signed)
Faxed cardiac clearance to Dr. Harrell Gave End at [phone].

## 2023-08-16 NOTE — Telephone Encounter (Signed)
 Dr. Mady,  [name] is requesting preoperative cardiac evaluation for robotic ventral hernia repair.  Procedure is TBD.  Her PMH includes coronary artery disease status post PCI, SVT, DVT/PE.  She was placed on amiodarone  therapy around the time of her PCI due to runs of SVT during her LHC.  She received proximal stenting to her LAD 9/24.  She was last seen in clinic on 06/27/2023.  During that time she reported feeling her heart racing.  Her diltiazem  was uptitrated to 180 mg twice daily.  Follow-up was planned for 6 months.  May her Plavix  be held prior to her procedure?  Thank you for your help.  Please direct your response to CV DIV preop pool.  Courtney Wilcox. Courtney Caprara NP-C     08/16/2023, 2:36 PM [facility] [address] Office [phone] Fax [phone]

## 2023-08-16 NOTE — Patient Instructions (Signed)
 You have requested to have a Ventral Hernia Repair. This will be done by Dr Marinda at [facility]. Please see your (BLUE) Pre-care sheet for more information. Our surgery scheduler will call you to look at surgery dates and to go over surgery information.   If you are on any injectable weight loss medication, you will need to stop taking your GLP-1 injectable (weight loss) medications 8 days before your surgery to avoid any complications with anesthesia.   You will need to arrange to be out of work for approximately 1-2 weeks and then you may return with a lifting restriction for 4 more weeks. If you have FMLA or Disability paperwork that needs to be filled out, please have your company fax your paperwork to [phone] or you may drop this by either office. This paperwork will be filled out within 3 days after your surgery has been completed.     Ventral Hernia A ventral hernia (also called an incisional hernia) is a hernia that occurs at the site of a previous surgical cut (incision) in the abdomen. The abdominal wall spans from your lower chest down to your pelvis. If the abdominal wall is weakened from a surgical incision, a hernia can occur. A hernia is a bulge of bowel or muscle tissue pushing out on the weakened part of the abdominal wall. Ventral hernias can get bigger from straining or lifting. Obese and older people are at higher risk for a ventral hernia. People who develop infections after surgery or require repeat incisions at the same site on the abdomen are also at increased risk. CAUSES  A ventral hernia occurs because of weakness in the abdominal wall at an incision site.  SYMPTOMS  Common symptoms include: A visible bulge or lump on the abdominal wall. Pain or tenderness around the lump. Increased discomfort if you cough or make a sudden movement. If the hernia has blocked part of the intestine, a serious complication can occur (incarcerated or strangulated hernia). This can become a  problem that requires emergency surgery because the blood flow to the blocked intestine may be cut off. Symptoms may include: Feeling sick to your stomach (nauseous). Throwing up (vomiting). Stomach swelling (distention) or bloating. Fever. Rapid heartbeat. DIAGNOSIS  Your health care provider will take a medical history and perform a physical exam. Various tests may be ordered, such as: Blood tests. Urine tests. Ultrasonography. X-rays. Computed tomography (CT). TREATMENT  Watchful waiting may be all that is needed for a smaller hernia that does not cause symptoms. Your health care provider may recommend the use of a supportive belt (truss) that helps to keep the abdominal wall intact. For larger hernias or those that cause pain, surgery to repair the hernia is usually recommended. If a hernia becomes strangulated, emergency surgery needs to be done right away. HOME CARE INSTRUCTIONS Avoid putting pressure or strain on the abdominal area. Avoid heavy lifting. Use good body positioning for physical tasks. Ask your health care provider about proper body positioning. Use a supportive belt as directed by your health care provider. Maintain a healthy weight. Eat foods that are high in fiber, such as whole grains, fruits, and vegetables. Fiber helps prevent difficult bowel movements (constipation). Drink enough fluids to keep your urine clear or pale yellow. Follow up with your health care provider as directed. SEEK MEDICAL CARE IF:  Your hernia seems to be getting larger or more painful. SEEK IMMEDIATE MEDICAL CARE IF:  You have abdominal pain that is sudden and sharp. Your pain  becomes severe. You have repeated vomiting. You are sweating a lot. You notice a rapid heartbeat. You develop a fever. MAKE SURE YOU:  Understand these instructions. Will watch your condition. Will get help right away if you are not doing well or get worse.     Open Ventral Hernia Repair Open ventral  hernia repair is a surgery to fix a ventral hernia. A ventral hernia,  is a bulge of body tissue or intestines that pushes through the front part of the abdomen. This can happen if the connective tissue covering the muscles over the abdomen has a weak spot or is torn because of a surgical cut (incision) from a previous surgery. A ventral hernia repair is often done soon after diagnosis to stop the hernia from getting bigger, becoming uncomfortable, or becoming an emergency. This surgery usually takes about 2 hours, but the time can vary greatly.  LET [facility] CARE PROVIDER KNOW ABOUT: Any allergies you have. All medicines you are taking, including steroids, vitamins, herbs, eye drops, creams, and over-the-counter medicines. Previous problems you or members of your family have had with the use of anesthetics. Any blood disorders you have. Previous surgeries you have had. Medical conditions you have.  RISKS AND COMPLICATIONS  Generally, Open ventral hernia repair is a safe procedure. However, as with any surgical procedure, problems can occur. Possible problems include: Bleeding. Trouble passing urine or having a bowel movement after the surgery. Infection. Pneumonia. Blood clots. Pain in the area of the hernia. A bulge in the area of the hernia that may be caused by a collection of fluid. Injury to intestines or other structures in the abdomen. Return of the hernia after surgery.  BEFORE THE PROCEDURE  You may need to have blood tests, urine tests, a chest X-ray, or an electrocardiogram done before the day of the surgery. Ask your health care provider about changing or stopping your regular medicines. This is especially important if you are taking diabetes medicines or blood thinners. You may need to wash with a special type of germ-killing soap. Do not eat or drink anything after midnight the night before the procedure or as directed by your health care provider. Make plans to have  someone drive you home after the procedure.  PROCEDURE  Small monitors will be put on your body. They are used to check your heart, blood pressure, and oxygen level. An IV access tube will be put into a vein in your hand or arm. Fluids and medicine will flow directly into your body through the IV tube. You will be given medicine that makes you go to sleep (general anesthetic). Your abdomen will be cleaned with a special soap to kill any germs on your skin. Once you are asleep, a moderate - large size incision will be made in your abdomen. The size of incision depends on how large your hernia is. Your surgeon puts the tissue or intestines that formed the hernia back in place. A screen-like patch (mesh) is used to close the hernia. This helps make the area stronger. Stitches, tacks, or staples are used to keep the mesh in place. Medicine and a bandage (dressing) or skin glue will be put over the incision.  AFTER THE PROCEDURE  You will stay in a recovery area until the anesthetic wears off. Your blood pressure and pulse will be checked often. You may be able to go home the same day or may need to stay in the hospital for 1-2 days after surgery. Your  surgeon will decide when you can go home depending upon your recovery. You may feel some pain. You will be given medicine for pain. You will be urged to do breathing exercises that involve taking deep breaths. This helps prevent a lung infection after a surgery. You may have to wear compression stockings while you are in the hospital. These stockings help keep blood clots from forming in your legs.   This information is not intended to replace advice given to you by your health care provider. Make sure you discuss any questions you have with your health care provider.   Document Released: 12/20/2011 Document Revised: 01/07/2013 Document Reviewed: 12/20/2011 Elsevier Interactive Patient Education [employer]

## 2023-08-17 ENCOUNTER — Telehealth: Payer: Self-pay

## 2023-08-17 NOTE — Telephone Encounter (Signed)
 Received pulmonary clearance from Dr. Aleskerov. Pt's risk assessment is low and is optimized for surgery.

## 2023-08-17 NOTE — Telephone Encounter (Signed)
 Given ostial LAD stent, I recommend that she waits another month before proceeding with surgery if it is elective.  At that time, clopidogrel  can be held for 5 days but she should continue to take aspirin  81 mg once daily. She should be at low risk from a cardiac standpoint.

## 2023-08-20 ENCOUNTER — Telehealth: Payer: Self-pay | Admitting: General Surgery

## 2023-08-20 NOTE — Telephone Encounter (Signed)
 Spoke with pt and pts daughter regarding VV for preop clearance. Pt stated she would rather be seen in office. Pt was scheduled for OV on 09/21/23.

## 2023-08-20 NOTE — Telephone Encounter (Signed)
 Patient calls. She has follow up appointment with Dr. Marinda on 08/30/23 and has evaluation with cardiology on 09/21/23.  This was the soonest cardiology could get her in.  Patient just wanted to make sure that this is ok with her ventral hernia to wait this long for possible ventral hernia.  Please advise and call patient.

## 2023-08-20 NOTE — Telephone Encounter (Signed)
 Preoperative team, per Dr. Darron patient will need to wait 1 more month prior to ventral hernia repair.  Please schedule virtual visit for after 09/17/2023.  Thank you for your help.    Name: Courtney Wilcox  DOB: 1945/11/27  MRN: [id number]  Primary Cardiologist: Lonni Hanson, MD   Preoperative team, please contact this patient and set up a phone call appointment for further preoperative risk assessment. Please obtain consent and complete medication review. Thank you for your help.  I confirm that guidance regarding antiplatelet and oral anticoagulation therapy has been completed and, if necessary, noted below.  Her clopidogrel  may be held for 5 days prior to her procedure.  She will need to take 81 mg aspirin  throughout her procedure.  I also confirmed the patient resides in the state of [place] . As per [facility] [employer] telemedicine laws, the patient must reside in the state in which the provider is licensed.   Courtney CHRISTELLA Beauvais, NP 08/20/2023, 8:07 AM [facility]

## 2023-08-21 NOTE — Progress Notes (Signed)
 Patient ID: Courtney Wilcox, female   DOB: [birth year], 78 y.o.   MRN: [id number] CC: Ventral Hernia History of Present Illness Courtney Wilcox is a 78 y.o. female with past medical history significant for SVT and atrial tachycardia as well as COPD who presents in consultation for ventral hernia.  The patient reports that she has had a ventral hernia for some time however recently she has started develop pain over the area.  She has also developed left lower quadrant pain and was seen by GI recently.  She was started on antibiotics for left lower quadrant pain and underwent a CT scan that showed ventral hernia with transverse colon in it.  She was then directed to the ED for evaluation.  There her hernia was reducible and she was discharged and follows up with me today.  She reports some constipation recently as well.  She denies any overlying skin changes to that hernia.  She says that she has not had any episodes of vomiting and denies any obstipation.  She does have a significant cardiac history and is on Plavix  and aspirin .  Her surgical history is significant for an umbilical hernia repair at some 30 years ago.  She is unsure if she had any mesh placed.  Past Medical History Past Medical History:  Diagnosis Date   Allergic rhinitis    Anxiety    Arthritis    Asthma    Atrophic vaginitis    B12 deficiency    Bronchitis, chronic (HCC)    CAD (coronary artery disease)    a. 03/2003 Cath: Nl cors; b. 09/2022 Staged PCI: LM nl, LAD 85p/53m (3.5x26 Onyx Frontier DES), RI small, mild dzs, LCX nl, OM2 mild dzs, RCA 30p.   Cervical disc disease    CKD (chronic kidney disease), stage III (HCC)    Collagen vascular disease (HCC)    RA   Cough - post-COVID    Depression    Diastolic dysfunction    a. 11/2020 Echo: EF 45-50%, GrI DD; b. 10/2021 Echo: EF 60-65%, GrII DD; c. 09/2022 Echo: EF 55-60%, no rwma, GrI DD, nl RV fxn, mildly dil LA. No significant valvular dzs.   Fibrocystic breast disease     Foot ulcer (HCC)    GERD (gastroesophageal reflux disease)    Hammer toe    Hyperlipidemia    IBS (irritable bowel syndrome)    Macrocytic anemia    Macular degeneration 2023   Obesity    OSA (obstructive sleep apnea)    Osteoarthrosis    Multi site   Osteopenia    PSVT (paroxysmal supraventricular tachycardia) (HCC)    a. 04/2022 Zio: Sinus rhythm @ 57 (42-101). Rare PACs/PVCs. 74290 SVT runs lasting up to 66m 12sec - max rate 222. 5 beats NSVT. Triggered events = sinus, SVT, PACs.   Rheumatoid arthritis (HCC)    Shingles    Thyroid  nodule    MULTIPLE   Tonsillitis    recurrent   Umbilical hernia        Past Surgical History:  Procedure Laterality Date   BACK SURGERY  2011   LAMINECTOMY   BREAST BIOPSY Right    neg   CERVICAL FUSION  2011, 2012   X 2   COLONOSCOPY  2005, 2015   COLONOSCOPY WITH PROPOFOL  N/A 08/04/2020   Procedure: COLONOSCOPY WITH PROPOFOL ;  Surgeon: Dessa Reyes ORN, MD;  Location: [facility] ENDOSCOPY;  Service: Endoscopy;  Laterality: N/A;   CORONARY STENT INTERVENTION N/A 10/02/2022   Procedure: CORONARY  STENT INTERVENTION;  Surgeon: Darron Deatrice LABOR, MD;  Location: [facility] INVASIVE CV LAB;  Service: Cardiovascular;  Laterality: N/A;   ESOPHAGOGASTRODUODENOSCOPY (EGD) WITH PROPOFOL  N/A 09/26/2021   Procedure: ESOPHAGOGASTRODUODENOSCOPY (EGD) WITH PROPOFOL ;  Surgeon: Onita Elspeth Sharper, DO;  Location: [facility] END[facility]COPY;  Service: Gastroenterology;  Laterality: N/A;   ESOPHAGOGASTRODUODENOSCOPY ENDOSCOPY  2005, 2013, 2015   EYE SURGERY Bilateral    Cataract Extraction with IOL    HERNIA REPAIR     JOINT REPLACEMENT     KNEE ARTHROPLASTY Left 07/12/2016   Procedure: COMPUTER ASSISTED TOTAL KNEE ARTHROPLASTY;  Surgeon: Mardee Lynwood SQUIBB, MD;  Location: [facility] ORS;  Service: Orthopedics;  Laterality: Left;   Laryngeal tear after intubation w/repair     LUMBAR DISC SURGERY  12/2009   REPLACEMENT TOTAL KNEE Right 06/2007   DR. HOOTEN, [facility]   REPLACEMENT UNICONDYLAR  JOINT KNEE     DR. HOOTEN, [facility]   RIGHT/LEFT HEART CATH AND CORONARY ANGIOGRAPHY N/A 09/29/2022   Procedure: RIGHT/LEFT HEART CATH AND CORONARY ANGIOGRAPHY;  Surgeon: Darron Deatrice LABOR, MD;  Location: [facility] INVASIVE CV LAB;  Service: Cardiovascular;  Laterality: N/A;   SHOULDER ARTHROSCOPY WITH ROTATOR CUFF REPAIR Right    TONSILLECTOMY     TOTAL HIP ARTHROPLASTY Right 01/31/2017   Procedure: TOTAL HIP ARTHROPLASTY;  Surgeon: Mardee Lynwood SQUIBB, MD;  Location: [facility] ORS;  Service: Orthopedics;  Laterality: Right;   TUBAL LIGATION     UPPER GI ENDOSCOPY      Allergies  Allergen Reactions   Amiodarone  Other (See Comments)    Fatigue, Nausea, anorexia, malaise   Lactose Other (See Comments) and Diarrhea   Other Other (See Comments)    Pt ONLY tolerates SLOW-FE (slow released iron )---upsets IBS   Biaxin [Clarithromycin] Other (See Comments)    GI upset    Hydrocodone -Acetaminophen  Itching    Tolerates acetaminophen     Oxycodone Itching   Sulfa Antibiotics Other (See Comments) and Diarrhea    GI upset  GI upset GI upset    Sulfasalazine Diarrhea    Current Outpatient Medications  Medication Sig Dispense Refill   acetaminophen  (TYLENOL ) 500 MG tablet Take 1,000 mg by mouth 3 (three) times daily as needed for mild pain.      ALPRAZolam  (XANAX ) 0.5 MG tablet Take 1 tablet (0.5 mg total) by mouth at bedtime as needed for sleep or anxiety. 30 tablet 0   Cholecalciferol  (VITAMIN D ) 2000 units tablet Take 2,000 Units by mouth daily.     clopidogrel  (PLAVIX ) 75 MG tablet TAKE 1 TABLET BY MOUTH DAILY WITH BREAKFAST. 90 tablet 3   Cyanocobalamin 1000 MCG/ML KIT Inject as directed every 30 (thirty) days.     diltiazem  (CARDIZEM  CD) 180 MG 24 hr capsule Take 1 capsule (180 mg total) by mouth in the morning and at bedtime. 180 capsule 3   Dupilumab 300 MG/2ML SOPN Inject 4 mLs into the skin every 14 (fourteen) days.     EPINEPHrine 0.3 mg/0.3 mL IJ SOAJ injection Inject 0.3 mg into the muscle as  needed.     famotidine  (PEPCID ) 20 MG tablet Take 20 mg by mouth daily as needed.     Fluticasone -Umeclidin-Vilant 100-62.5-25 MCG/ACT AEPB Inhale into the lungs. Inhale 1 Puff into the lungs once daily     gabapentin  (NEURONTIN ) 300 MG capsule Take 300 mg by mouth at bedtime.     leflunomide  (ARAVA ) 10 MG tablet Take 10 mg by mouth daily.     levalbuterol  (XOPENEX ) 0.63 MG/3ML nebulizer solution Take 0.63 mg  by nebulization every 6 (six) hours as needed for wheezing.     meclizine (ANTIVERT) 12.5 MG tablet PLEASE SEE ATTACHED FOR DETAILED DIRECTIONS     methylPREDNISolone  (MEDROL  DOSEPAK) 4 MG TBPK tablet Take by mouth.     montelukast  (SINGULAIR ) 10 MG tablet Take 10 mg by mouth daily as needed (for allergies.).      nystatin  (MYCOSTATIN ) 100000 UNIT/ML suspension Take 5 mLs by mouth as needed.     pantoprazole  (PROTONIX ) 40 MG tablet Take 1 tablet (40 mg total) by mouth 2 (two) times daily. (Patient taking differently: Take 40 mg by mouth daily.) 60 tablet 1   potassium chloride  SA (KLOR-CON  M) 20 MEQ tablet Take 20 mEq by mouth.     predniSONE  (DELTASONE ) 2.5 MG tablet Take 2 tablets (5 mg total) by mouth daily with breakfast. Resume after finishing high doses of prednisone      sertraline  (ZOLOFT ) 25 MG tablet Take 50 mg by mouth daily. 50 mg total     Simethicone  180 MG CAPS Take 1-2 capsules by mouth as needed.     sodium bicarbonate  650 MG tablet Take 1 tablet (650 mg total) by mouth 2 (two) times daily. 90 tablet 1   triamcinolone  (NASACORT ) 55 MCG/ACT AERO nasal inhaler Place 2 sprays into the nose daily.     No current facility-administered medications for this visit.    Family History Family History  Problem Relation Age of Onset   Asthma Mother    Breast cancer Mother 45   Emphysema Father        smoked   Lung cancer Father        smoked   Coronary artery disease Father 71       Social History Social History   Tobacco Use   Smoking status: Never   Smokeless tobacco:  Never  Vaping Use   Vaping status: Never Used  Substance Use Topics   Alcohol use: No   Drug use: No        ROS Full ROS of systems performed and is otherwise negative there than what is stated in the HPI  Physical Exam Blood pressure (!) 148/79, pulse 80, temperature 98 F (36.7 C), temperature source Oral, height 5' 4 (1.626 m), weight 190 lb 12.8 oz (86.5 kg), SpO2 96%.  Alert and oriented x 3, normal work of breathing on room air, abdomen is soft, obese, there is some tenderness right over the hernia as well as in the left lower quadrant.  I am able to almost completely reduce the hernia but it does feel like there is still some chronically incarcerated fat within the hernia.  Within her umbilicus there is a very well-healed incision.  Data Reviewed I independently reviewed her CT scan.  There is a hernia that does seem to contain transverse colon within it.  I have personally reviewed the patient's imaging and medical records.    Assessment/Plan    78 year old female with symptomatic ventral hernia.  I discussed with her that I would recommend that she get it repaired but she has significant medical comorbidities.  We will plan to get risk stratification from cardiology and pulmonology before this.  We will see her back when this is done to talk more extensively about surgery and schedule a date for surgery.  I discussed warning signs of hernia including overlying skin changes, obstipation, nausea and vomiting as well as unrelieved pain.  Total 45 minutes was spent reviewing the patient's chart, performing interval history and physical  and discussing treatment options with patient  Jayson MALVA Endow

## 2023-08-27 ENCOUNTER — Other Ambulatory Visit

## 2023-08-30 ENCOUNTER — Ambulatory Visit: Admitting: Oncology

## 2023-08-30 ENCOUNTER — Ambulatory Visit

## 2023-08-30 ENCOUNTER — Ambulatory Visit: Admitting: General Surgery

## 2023-09-04 ENCOUNTER — Ambulatory Visit (INDEPENDENT_AMBULATORY_CARE_PROVIDER_SITE_OTHER): Admitting: General Surgery

## 2023-09-04 ENCOUNTER — Ambulatory Visit
Admission: RE | Admit: 2023-09-04 | Discharge: 2023-09-04 | Disposition: A | Discharge status: Home / Self Care | Source: Ambulatory Visit | Attending: General Surgery | Admitting: General Surgery

## 2023-09-04 ENCOUNTER — Inpatient Hospital Stay
Admission: RE | Admit: 2023-09-04 | Discharge: 2023-09-07 | DRG: 354 | Disposition: A | Discharge status: Home / Self Care | Attending: Internal Medicine | Admitting: Internal Medicine

## 2023-09-04 ENCOUNTER — Ambulatory Visit: Payer: Self-pay | Admitting: General Surgery

## 2023-09-04 ENCOUNTER — Inpatient Hospital Stay: Admitting: Certified Registered"

## 2023-09-04 ENCOUNTER — Encounter: Payer: Self-pay | Admitting: General Surgery

## 2023-09-04 ENCOUNTER — Other Ambulatory Visit: Payer: Self-pay

## 2023-09-04 ENCOUNTER — Encounter
Admission: RE | Disposition: A | Discharge status: Home / Self Care | Payer: Self-pay | Source: Home / Self Care | Attending: General Surgery

## 2023-09-04 ENCOUNTER — Inpatient Hospital Stay

## 2023-09-04 VITALS — BP 138/81 | HR 81 | Ht 64.0 in | Wt 190.0 lb

## 2023-09-04 DIAGNOSIS — E785 Hyperlipidemia, unspecified: Secondary | ICD-10-CM | POA: Diagnosis present

## 2023-09-04 DIAGNOSIS — Z885 Allergy status to narcotic agent status: Secondary | ICD-10-CM | POA: Diagnosis not present

## 2023-09-04 DIAGNOSIS — D631 Anemia in chronic kidney disease: Secondary | ICD-10-CM | POA: Diagnosis present

## 2023-09-04 DIAGNOSIS — Z825 Family history of asthma and other chronic lower respiratory diseases: Secondary | ICD-10-CM | POA: Diagnosis not present

## 2023-09-04 DIAGNOSIS — Z801 Family history of malignant neoplasm of trachea, bronchus and lung: Secondary | ICD-10-CM

## 2023-09-04 DIAGNOSIS — K439 Ventral hernia without obstruction or gangrene: Secondary | ICD-10-CM | POA: Insufficient documentation

## 2023-09-04 DIAGNOSIS — J4489 Other specified chronic obstructive pulmonary disease: Secondary | ICD-10-CM | POA: Diagnosis present

## 2023-09-04 DIAGNOSIS — K430 Incisional hernia with obstruction, without gangrene: Principal | ICD-10-CM | POA: Diagnosis present

## 2023-09-04 DIAGNOSIS — I2584 Coronary atherosclerosis due to calcified coronary lesion: Secondary | ICD-10-CM | POA: Diagnosis present

## 2023-09-04 DIAGNOSIS — G4733 Obstructive sleep apnea (adult) (pediatric): Secondary | ICD-10-CM | POA: Diagnosis present

## 2023-09-04 DIAGNOSIS — I5032 Chronic diastolic (congestive) heart failure: Secondary | ICD-10-CM | POA: Diagnosis present

## 2023-09-04 DIAGNOSIS — M069 Rheumatoid arthritis, unspecified: Secondary | ICD-10-CM | POA: Diagnosis present

## 2023-09-04 DIAGNOSIS — K436 Other and unspecified ventral hernia with obstruction, without gangrene: Secondary | ICD-10-CM | POA: Diagnosis not present

## 2023-09-04 DIAGNOSIS — J42 Unspecified chronic bronchitis: Secondary | ICD-10-CM | POA: Diagnosis not present

## 2023-09-04 DIAGNOSIS — E875 Hyperkalemia: Secondary | ICD-10-CM | POA: Diagnosis present

## 2023-09-04 DIAGNOSIS — K219 Gastro-esophageal reflux disease without esophagitis: Secondary | ICD-10-CM | POA: Diagnosis present

## 2023-09-04 DIAGNOSIS — H3530 Unspecified macular degeneration: Secondary | ICD-10-CM | POA: Diagnosis present

## 2023-09-04 DIAGNOSIS — Z7901 Long term (current) use of anticoagulants: Secondary | ICD-10-CM

## 2023-09-04 DIAGNOSIS — Z96653 Presence of artificial knee joint, bilateral: Secondary | ICD-10-CM | POA: Diagnosis present

## 2023-09-04 DIAGNOSIS — Z803 Family history of malignant neoplasm of breast: Secondary | ICD-10-CM

## 2023-09-04 DIAGNOSIS — Z888 Allergy status to other drugs, medicaments and biological substances status: Secondary | ICD-10-CM

## 2023-09-04 DIAGNOSIS — Z955 Presence of coronary angioplasty implant and graft: Secondary | ICD-10-CM

## 2023-09-04 DIAGNOSIS — Z6835 Body mass index (BMI) 35.0-35.9, adult: Secondary | ICD-10-CM

## 2023-09-04 DIAGNOSIS — Z86711 Personal history of pulmonary embolism: Secondary | ICD-10-CM

## 2023-09-04 DIAGNOSIS — Z882 Allergy status to sulfonamides status: Secondary | ICD-10-CM | POA: Diagnosis not present

## 2023-09-04 DIAGNOSIS — Z981 Arthrodesis status: Secondary | ICD-10-CM

## 2023-09-04 DIAGNOSIS — Z881 Allergy status to other antibiotic agents status: Secondary | ICD-10-CM | POA: Diagnosis not present

## 2023-09-04 DIAGNOSIS — I471 Supraventricular tachycardia, unspecified: Secondary | ICD-10-CM | POA: Diagnosis present

## 2023-09-04 DIAGNOSIS — R1084 Generalized abdominal pain: Secondary | ICD-10-CM

## 2023-09-04 DIAGNOSIS — N1832 Chronic kidney disease, stage 3b: Secondary | ICD-10-CM | POA: Diagnosis present

## 2023-09-04 DIAGNOSIS — Z96641 Presence of right artificial hip joint: Secondary | ICD-10-CM | POA: Diagnosis present

## 2023-09-04 DIAGNOSIS — I2510 Atherosclerotic heart disease of native coronary artery without angina pectoris: Secondary | ICD-10-CM | POA: Diagnosis present

## 2023-09-04 DIAGNOSIS — D638 Anemia in other chronic diseases classified elsewhere: Secondary | ICD-10-CM | POA: Diagnosis present

## 2023-09-04 DIAGNOSIS — E66811 Obesity, class 1: Secondary | ICD-10-CM | POA: Diagnosis present

## 2023-09-04 DIAGNOSIS — Z8249 Family history of ischemic heart disease and other diseases of the circulatory system: Secondary | ICD-10-CM | POA: Diagnosis not present

## 2023-09-04 DIAGNOSIS — K5900 Constipation, unspecified: Secondary | ICD-10-CM | POA: Diagnosis present

## 2023-09-04 HISTORY — PX: VENTRAL HERNIA REPAIR: SHX424

## 2023-09-04 HISTORY — PX: INSERTION OF MESH: SHX5868

## 2023-09-04 SURGERY — REPAIR, HERNIA, VENTRAL
Anesthesia: General | Site: Abdomen

## 2023-09-04 MED ORDER — CHLORHEXIDINE GLUCONATE CLOTH 2 % EX PADS
6.0000 | MEDICATED_PAD | Freq: Once | CUTANEOUS | Status: DC
  Administered 2023-09-05: 6 via TOPICAL

## 2023-09-04 MED ORDER — TRIAMCINOLONE ACETONIDE 55 MCG/ACT NA AERO
2.0000 | INHALATION_SPRAY | Freq: Every day | NASAL | Status: DC
  Administered 2023-09-05 – 2023-09-07 (×3): 2 via NASAL
  Filled 2023-09-04: qty 10.8

## 2023-09-04 MED ORDER — SODIUM CHLORIDE 0.9 % IV SOLN
INTRAVENOUS | Status: AC
  Filled 2023-09-04: qty 2

## 2023-09-04 MED ORDER — SODIUM CHLORIDE 0.9 % IV SOLN
2.0000 g | INTRAVENOUS | Status: AC
  Administered 2023-09-04: 2 g via INTRAVENOUS

## 2023-09-04 MED ORDER — FENTANYL CITRATE (PF) 100 MCG/2ML IJ SOLN
INTRAMUSCULAR | Status: AC
  Filled 2023-09-04: qty 2

## 2023-09-04 MED ORDER — BUPIVACAINE-EPINEPHRINE (PF) 0.5% -1:200000 IJ SOLN
INTRAMUSCULAR | Status: AC
  Filled 2023-09-04: qty 20

## 2023-09-04 MED ORDER — ALBUTEROL SULFATE HFA 108 (90 BASE) MCG/ACT IN AERS
INHALATION_SPRAY | RESPIRATORY_TRACT | Status: DC | PRN
  Administered 2023-09-04: 3 via RESPIRATORY_TRACT

## 2023-09-04 MED ORDER — IOHEXOL 9 MG/ML PO SOLN
500.0000 mL | ORAL | Status: AC
  Administered 2023-09-04 (×2): 500 mL via ORAL

## 2023-09-04 MED ORDER — FENTANYL CITRATE (PF) 100 MCG/2ML IJ SOLN
INTRAMUSCULAR | Status: DC | PRN
  Administered 2023-09-04 (×2): 50 ug via INTRAVENOUS

## 2023-09-04 MED ORDER — OXYCODONE HCL 5 MG PO TABS
10.0000 mg | ORAL_TABLET | ORAL | Status: DC | PRN
  Administered 2023-09-05: 10 mg via ORAL
  Filled 2023-09-04 (×2): qty 2

## 2023-09-04 MED ORDER — ONDANSETRON HCL 4 MG/2ML IJ SOLN
INTRAMUSCULAR | Status: DC | PRN
  Administered 2023-09-04: 4 mg via INTRAVENOUS

## 2023-09-04 MED ORDER — DEXAMETHASONE SODIUM PHOSPHATE 10 MG/ML IJ SOLN
INTRAMUSCULAR | Status: DC | PRN
  Administered 2023-09-04: 8 mg via INTRAVENOUS

## 2023-09-04 MED ORDER — ACETAMINOPHEN 500 MG PO TABS
1000.0000 mg | ORAL_TABLET | Freq: Three times a day (TID) | ORAL | Status: DC
  Administered 2023-09-05 – 2023-09-07 (×8): 1000 mg via ORAL
  Filled 2023-09-04 (×8): qty 2

## 2023-09-04 MED ORDER — PHENYLEPHRINE 80 MCG/ML (10ML) SYRINGE FOR IV PUSH (FOR BLOOD PRESSURE SUPPORT)
PREFILLED_SYRINGE | INTRAVENOUS | Status: DC | PRN
  Administered 2023-09-04: 80 ug via INTRAVENOUS

## 2023-09-04 MED ORDER — BUPIVACAINE-EPINEPHRINE (PF) 0.5% -1:200000 IJ SOLN
INTRAMUSCULAR | Status: DC | PRN
Start: 2023-09-04 — End: 2023-09-04
  Administered 2023-09-04: 30 mL

## 2023-09-04 MED ORDER — CEFAZOLIN SODIUM-DEXTROSE 2-4 GM/100ML-% IV SOLN
2.0000 g | Freq: Three times a day (TID) | INTRAVENOUS | Status: AC
  Administered 2023-09-04 – 2023-09-05 (×3): 2 g via INTRAVENOUS
  Filled 2023-09-04 (×3): qty 100

## 2023-09-04 MED ORDER — CHLORHEXIDINE GLUCONATE CLOTH 2 % EX PADS: 6.0000 | MEDICATED_PAD | Freq: Once | CUTANEOUS | Status: DC

## 2023-09-04 MED ORDER — LIDOCAINE HCL (CARDIAC) PF 100 MG/5ML IV SOSY
PREFILLED_SYRINGE | INTRAVENOUS | Status: DC | PRN
  Administered 2023-09-04: 50 mg via INTRAVENOUS

## 2023-09-04 MED ORDER — PROPOFOL 10 MG/ML IV BOLUS
INTRAVENOUS | Status: AC
  Filled 2023-09-04: qty 20

## 2023-09-04 MED ORDER — 0.9 % SODIUM CHLORIDE (POUR BTL) OPTIME
TOPICAL | Status: DC | PRN
  Administered 2023-09-04: 500 mL

## 2023-09-04 MED ORDER — FENTANYL CITRATE (PF) 100 MCG/2ML IJ SOLN
INTRAMUSCULAR | Status: AC
Start: 2023-09-04 — End: 2023-09-04
  Filled 2023-09-04: qty 2

## 2023-09-04 MED ORDER — SUGAMMADEX SODIUM 200 MG/2ML IV SOLN
INTRAVENOUS | Status: DC | PRN
  Administered 2023-09-04: 200 mg via INTRAVENOUS

## 2023-09-04 MED ORDER — SENNA 8.6 MG PO TABS
1.0000 | ORAL_TABLET | Freq: Two times a day (BID) | ORAL | Status: DC
  Administered 2023-09-04 – 2023-09-06 (×4): 8.6 mg via ORAL
  Filled 2023-09-04 (×4): qty 1

## 2023-09-04 MED ORDER — PROPOFOL 10 MG/ML IV BOLUS
INTRAVENOUS | Status: DC | PRN
  Administered 2023-09-04: 110 mg via INTRAVENOUS

## 2023-09-04 MED ORDER — ENOXAPARIN SODIUM 40 MG/0.4ML IJ SOSY
40.0000 mg | PREFILLED_SYRINGE | INTRAMUSCULAR | Status: DC
  Administered 2023-09-05 – 2023-09-07 (×3): 40 mg via SUBCUTANEOUS
  Filled 2023-09-04 (×3): qty 0.4

## 2023-09-04 MED ORDER — SERTRALINE HCL 50 MG PO TABS
50.0000 mg | ORAL_TABLET | Freq: Every day | ORAL | Status: DC
  Administered 2023-09-05 – 2023-09-07 (×3): 50 mg via ORAL
  Filled 2023-09-04 (×3): qty 1

## 2023-09-04 MED ORDER — FENTANYL CITRATE (PF) 100 MCG/2ML IJ SOLN
25.0000 ug | INTRAMUSCULAR | Status: DC | PRN
  Administered 2023-09-04 (×2): 25 ug via INTRAVENOUS
  Administered 2023-09-04: 50 ug via INTRAVENOUS

## 2023-09-04 MED ORDER — HYDROMORPHONE HCL 1 MG/ML IJ SOLN
INTRAMUSCULAR | Status: AC
  Filled 2023-09-04: qty 1

## 2023-09-04 MED ORDER — IOHEXOL 300 MG/ML  SOLN
80.0000 mL | Freq: Once | INTRAMUSCULAR | Status: AC | PRN
  Administered 2023-09-04: 80 mL via INTRAVENOUS

## 2023-09-04 MED ORDER — LACTATED RINGERS IV SOLN: INTRAVENOUS | Status: DC

## 2023-09-04 MED ORDER — CHLORHEXIDINE GLUCONATE 0.12 % MT SOLN
OROMUCOSAL | Status: AC
  Filled 2023-09-04: qty 15

## 2023-09-04 MED ORDER — ACETAMINOPHEN 10 MG/ML IV SOLN
INTRAVENOUS | Status: AC
  Filled 2023-09-04: qty 100

## 2023-09-04 MED ORDER — HYDROMORPHONE HCL 1 MG/ML IJ SOLN
0.5000 mg | INTRAMUSCULAR | Status: DC | PRN
  Administered 2023-09-04: 0.5 mg via INTRAVENOUS
  Filled 2023-09-04: qty 0.5

## 2023-09-04 MED ORDER — ROCURONIUM BROMIDE 100 MG/10ML IV SOLN
INTRAVENOUS | Status: DC | PRN
  Administered 2023-09-04: 50 mg via INTRAVENOUS
  Administered 2023-09-04: 20 mg via INTRAVENOUS

## 2023-09-04 MED ORDER — ACETAMINOPHEN 10 MG/ML IV SOLN
INTRAVENOUS | Status: DC | PRN
  Administered 2023-09-04: 1000 mg via INTRAVENOUS

## 2023-09-04 MED ORDER — OXYCODONE HCL 5 MG PO TABS
5.0000 mg | ORAL_TABLET | ORAL | Status: DC | PRN
  Administered 2023-09-05 – 2023-09-06 (×3): 5 mg via ORAL
  Filled 2023-09-04 (×4): qty 1

## 2023-09-04 MED ORDER — SODIUM BICARBONATE 650 MG PO TABS
650.0000 mg | ORAL_TABLET | Freq: Two times a day (BID) | ORAL | Status: DC
  Administered 2023-09-05: 650 mg via ORAL
  Filled 2023-09-04 (×2): qty 1

## 2023-09-04 MED ORDER — BUDESON-GLYCOPYRROL-FORMOTEROL 160-9-4.8 MCG/ACT IN AERO
2.0000 | INHALATION_SPRAY | Freq: Two times a day (BID) | RESPIRATORY_TRACT | Status: DC
  Filled 2023-09-04 (×2): qty 5.9

## 2023-09-04 MED ORDER — DILTIAZEM HCL ER COATED BEADS 180 MG PO CP24
180.0000 mg | ORAL_CAPSULE | Freq: Two times a day (BID) | ORAL | Status: DC
  Administered 2023-09-04 – 2023-09-05 (×3): 180 mg via ORAL
  Filled 2023-09-04 (×5): qty 1

## 2023-09-04 MED ORDER — BUPIVACAINE LIPOSOME 1.3 % IJ SUSP
INTRAMUSCULAR | Status: AC
  Filled 2023-09-04: qty 10

## 2023-09-04 MED ORDER — DROPERIDOL 2.5 MG/ML IJ SOLN: 0.6250 mg | Freq: Once | INTRAMUSCULAR | Status: DC | PRN

## 2023-09-04 MED ORDER — DOCUSATE SODIUM 100 MG PO CAPS
100.0000 mg | ORAL_CAPSULE | Freq: Two times a day (BID) | ORAL | Status: DC
  Administered 2023-09-04 – 2023-09-06 (×4): 100 mg via ORAL
  Filled 2023-09-04 (×4): qty 1

## 2023-09-04 MED ORDER — ONDANSETRON HCL 4 MG/2ML IJ SOLN
INTRAMUSCULAR | Status: AC
  Filled 2023-09-04: qty 2

## 2023-09-04 MED ORDER — ORAL CARE MOUTH RINSE: 15.0000 mL | Freq: Once | OROMUCOSAL | Status: AC

## 2023-09-04 MED ORDER — CHLORHEXIDINE GLUCONATE 0.12 % MT SOLN
15.0000 mL | Freq: Once | OROMUCOSAL | Status: AC
  Administered 2023-09-04: 15 mL via OROMUCOSAL

## 2023-09-04 MED ORDER — BUPIVACAINE-EPINEPHRINE (PF) 0.5% -1:200000 IJ SOLN
INTRAMUSCULAR | Status: AC
  Filled 2023-09-04: qty 10

## 2023-09-04 MED ORDER — ALBUTEROL SULFATE (2.5 MG/3ML) 0.083% IN NEBU
3.0000 mL | INHALATION_SOLUTION | Freq: Four times a day (QID) | RESPIRATORY_TRACT | Status: DC | PRN
  Administered 2023-09-05: 3 mL via RESPIRATORY_TRACT
  Filled 2023-09-04: qty 3

## 2023-09-04 SURGICAL SUPPLY — 44 items
BLADE SURG 15 STRL LF DISP TIS (BLADE) ×2 IMPLANT
BLADE SURG SZ10 CARB STEEL (BLADE) IMPLANT
CHLORAPREP W/TINT 26 (MISCELLANEOUS) IMPLANT
DERMABOND ADVANCED .7 DNX12 (GAUZE/BANDAGES/DRESSINGS) IMPLANT
DRAPE LAPAROTOMY 100X77 ABD (DRAPES) ×2 IMPLANT
DRSG OPSITE POSTOP 4X10 (GAUZE/BANDAGES/DRESSINGS) IMPLANT
DRSG OPSITE POSTOP 4X6 (GAUZE/BANDAGES/DRESSINGS) IMPLANT
DRSG OPSITE POSTOP 4X8 (GAUZE/BANDAGES/DRESSINGS) IMPLANT
ELECT BLADE 6.5 EXT (BLADE) ×2 IMPLANT
ELECTRODE REM PT RTRN 9FT ADLT (ELECTROSURGICAL) ×2 IMPLANT
GLOVE BIOGEL PI IND STRL 7.5 (GLOVE) ×2 IMPLANT
GLOVE SURG SYN 7.0 PF PI (GLOVE) ×2 IMPLANT
GOWN STRL REUS W/ TWL LRG LVL3 (GOWN DISPOSABLE) ×4 IMPLANT
KIT TURNOVER KIT A (KITS) ×2 IMPLANT
LABEL OR SOLS (LABEL) ×2 IMPLANT
LIGASURE IMPACT 36 18CM CVD LR (INSTRUMENTS) IMPLANT
MANIFOLD NEPTUNE II (INSTRUMENTS) ×2 IMPLANT
MESH VENTRALEX ST 8CM LRG (Mesh General) IMPLANT
NDL HYPO 22X1.5 SAFETY MO (MISCELLANEOUS) ×2 IMPLANT
NEEDLE HYPO 22X1.5 SAFETY MO (MISCELLANEOUS) ×2 IMPLANT
NS IRRIG 1000ML POUR BTL (IV SOLUTION) ×2 IMPLANT
NS IRRIG 500ML POUR BTL (IV SOLUTION) ×2 IMPLANT
PACK BASIN MAJOR ARMC (MISCELLANEOUS) ×2 IMPLANT
PACK BASIN MINOR ARMC (MISCELLANEOUS) ×2 IMPLANT
PACK COLON CLEAN CLOSURE (MISCELLANEOUS) ×2 IMPLANT
RELOAD STAPLE 75 3.8 BLU REG (ENDOMECHANICALS) IMPLANT
RETRACTOR WND ALEXIS-O 25 LRG (MISCELLANEOUS) IMPLANT
RETRACTOR WOUND ALXS 18CM MED (MISCELLANEOUS) IMPLANT
SPONGE T-LAP 18X18 ~~LOC~~+RFID (SPONGE) ×2 IMPLANT
STAPLER GUN LINEAR PROX 60 (STAPLE) IMPLANT
STAPLER PROXIMATE 75MM BLUE (STAPLE) IMPLANT
STAPLER SKIN PROX 35W (STAPLE) ×2 IMPLANT
SUT ETHIBOND 0 MO6 C/R (SUTURE) ×2 IMPLANT
SUT PDS AB 0 CT1 27 (SUTURE) IMPLANT
SUT PDS PLUS AB 0 CT-2 (SUTURE) IMPLANT
SUT SILK 2-0 18XBRD TIE 12 (SUTURE) IMPLANT
SUT SILK 3 0 SH CR/8 (SUTURE) IMPLANT
SUT VIC AB 3-0 SH 27X BRD (SUTURE) ×2 IMPLANT
SUTURE MNCRL 4-0 27XMF (SUTURE) ×2 IMPLANT
SYR 10ML LL (SYRINGE) ×2 IMPLANT
SYR 20ML LL LF (SYRINGE) ×4 IMPLANT
TRAP FLUID SMOKE EVACUATOR (MISCELLANEOUS) ×2 IMPLANT
TRAY FOLEY MTR SLVR 16FR STAT (SET/KITS/TRAYS/PACK) ×2 IMPLANT
WATER STERILE IRR 500ML POUR (IV SOLUTION) ×2 IMPLANT

## 2023-09-04 NOTE — Op Note (Addendum)
 Operative note  Preoperative diagnosis: Incarcerated ventral hernia Postoperative diagnosis: Perforated ventral hernia Surgeon: Jayson Endow, MD Procedure: Open ventral hernia repair with mesh, hernia measures approximately 4 cm.Recurrent hernia EBL: 15 cc  After informed consent was obtained the patient was brought to the operating room placed supine on the operating room table.  General endotracheal anesthesia was then induced and her abdomen was then prepped and draped in the usual sterile fashion.  A surgical timeout was called identify correct patient, site, side and procedure.  Incision was made over the hernia.  This is taken down through the subcutaneous tissue with Bovie cautery.  The hernia sac was identified.  I did open the hernia sac and within the hernia sac there was a large amount of omentum as well as the transverse colon.  The hernia sac was dissected off of the fascia.  The transverse colon was examined and it was noted to be healthy appearing without evidence of necrosis or perforation.  I did have to open the fascia just a little bit on the inferior edge to better reduce the contents of the hernia.  The transverse colon and omentum were then placed back into the abdomen.  The fascia surrounding the hernia was completely cleared off.  I did lyse intra-abdominal adhesions to allow for mesh placement using Metzenbaum scissors.  Once the transverse colon and omentum were fully reduced into the abdomen and the fascia was cleared off I ensured that hemostasis was obtained.  The defect measured approximately 4 cm.  A Ventralight circular mesh was selected.  Using 0 Ethibond this was secured to the fascia in the cardinal directions.  The mesh was then placed intra-abdominal he and the tail of the mesh was pulled through the hernia defect.  The hernia defect was then closed with a series of 0 Ethibond sutures.  I did incorporate the tails of the mesh into the closure of the defect with 0  Ethibond to allow apposition of the mesh and the anterior abdominal wall.  Once the defect was primarily closed with mesh in place the tails of the mesh were then cut to just above the fascia.  The subcutaneous tissue was then irrigated with warm saline solution.  Hemostasis was again obtained.  The deep dermal layer was then closed with 3-0 Vicryl suture.  At this time our surgical counts were incorrect so an x-ray was obtained that did not show any retained foreign body.  The skin was then closed with staples and a sterile dressing was applied.  The patient was extubated and taken to the PACU in good condition.  Prior to termination of the procedure I did infiltrate the skin and fascia with 30 cc of half percent bupivacaine  with epinephrine .

## 2023-09-04 NOTE — Patient Instructions (Signed)
 We have scheduled you for a CT Scan of your Abdomen and Pelvis with contrast. [facility], as soon as you leave here, just head on over there so you can start drinking the Contrast

## 2023-09-04 NOTE — Progress Notes (Signed)
 Outpatient Surgical Follow Up  09/04/2023  Courtney Wilcox is an 78 y.o. female.   Chief Complaint  Patient presents with   Follow-up    Hernia     HPI: The patient is a 78 year old female that presents in my office for follow-up of a ventral hernia.  She was seen in the ED and at that time is reducible.  When she last saw me it was also reducible and she was having just intermittent pain with it.  At that time the decision was made to pursue cardiac restratification and pulmonary restratification prior to surgical repair of the hernia.  She did get pulmonary restratification and they recommended no further workup.  There was discussion about waiting until September for her ventral hernia repair from a cardiac standpoint and that she would be okay to hold Plavix  for 5 days but she did not get formal cardiac restratification.  However, over the last 3 days she reports increased pain over the area.  She says that she feels like the bulge is harder.  She says that she has had difficulty eating given that she has cramping pain when she eats.  She denies having any episodes of vomiting.  She does report she has been passing flatus but she seems to have been more constipated over the last 3 days.  Her last dose of Plavix  was on Sunday morning.  Past Medical History:  Diagnosis Date   Allergic rhinitis    Anxiety    Arthritis    Asthma    Atrophic vaginitis    B12 deficiency    Bronchitis, chronic (HCC)    CAD (coronary artery disease)    a. 03/2003 Cath: Nl cors; b. 09/2022 Staged PCI: LM nl, LAD 85p/74m (3.5x26 Onyx Frontier DES), RI small, mild dzs, LCX nl, OM2 mild dzs, RCA 30p.   Cervical disc disease    CKD (chronic kidney disease), stage III (HCC)    Collagen vascular disease (HCC)    RA   Cough - post-COVID    Depression    Diastolic dysfunction    a. 11/2020 Echo: EF 45-50%, GrI DD; b. 10/2021 Echo: EF 60-65%, GrII DD; c. 09/2022 Echo: EF 55-60%, no rwma, GrI DD, nl RV fxn, mildly  dil LA. No significant valvular dzs.   Fibrocystic breast disease    Foot ulcer (HCC)    GERD (gastroesophageal reflux disease)    Hammer toe    Hyperlipidemia    IBS (irritable bowel syndrome)    Macrocytic anemia    Macular degeneration 2023   Obesity    OSA (obstructive sleep apnea)    Osteoarthrosis    Multi site   Osteopenia    PSVT (paroxysmal supraventricular tachycardia) (HCC)    a. 04/2022 Zio: Sinus rhythm @ 57 (42-101). Rare PACs/PVCs. 74290 SVT runs lasting up to 53m 12sec - max rate 222. 5 beats NSVT. Triggered events = sinus, SVT, PACs.   Rheumatoid arthritis (HCC)    Shingles    Thyroid  nodule    MULTIPLE   Tonsillitis    recurrent   Umbilical hernia     Past Surgical History:  Procedure Laterality Date   BACK SURGERY  2011   LAMINECTOMY   BREAST BIOPSY Right    neg   CERVICAL FUSION  2011, 2012   X 2   COLONOSCOPY  2005, 2015   COLONOSCOPY WITH PROPOFOL  N/A 08/04/2020   Procedure: COLONOSCOPY WITH PROPOFOL ;  Surgeon: Dessa Reyes ORN, MD;  Location: [facility] ENDOSCOPY;  Service: Endoscopy;  Laterality: N/A;   CORONARY STENT INTERVENTION N/A 10/02/2022   Procedure: CORONARY STENT INTERVENTION;  Surgeon: Darron Deatrice LABOR, MD;  Location: [facility] INVASIVE CV LAB;  Service: Cardiovascular;  Laterality: N/A;   ESOPHAGOGASTRODUODENOSCOPY (EGD) WITH PROPOFOL  N/A 09/26/2021   Procedure: ESOPHAGOGASTRODUODENOSCOPY (EGD) WITH PROPOFOL ;  Surgeon: Onita Elspeth Sharper, DO;  Location: [facility] ENDOSCOPY;  Service: Gastroenterology;  Laterality: N/A;   ESOPHAGOGASTRODUODENOSCOPY ENDOSCOPY  2005, 2013, 2015   EYE SURGERY Bilateral    Cataract Extraction with IOL    HERNIA REPAIR     JOINT REPLACEMENT     KNEE ARTHROPLASTY Left 07/12/2016   Procedure: COMPUTER ASSISTED TOTAL KNEE ARTHROPLASTY;  Surgeon: Mardee Lynwood SQUIBB, MD;  Location: [facility] ORS;  Service: Orthopedics;  Laterality: Left;   Laryngeal tear after intubation w/repair     LUMBAR DISC SURGERY  12/2009   REPLACEMENT TOTAL  KNEE Right 06/2007   DR. HOOTEN, [facility]   REPLACEMENT UNICONDYLAR JOINT KNEE     DR. HOOTEN, [facility]   RIGHT/LEFT HEART CATH AND CORONARY ANGIOGRAPHY N/A 09/29/2022   Procedure: RIGHT/LEFT HEART CATH AND CORONARY ANGIOGRAPHY;  Surgeon: Darron Deatrice LABOR, MD;  Location: [facility] INVAS[facility] CV LAB;  Service: Cardiovascular;  Laterality: N/A;   SHOULDER ARTHROSCOPY WITH ROTATOR CUFF REPAIR Right    TONSILLECTOMY     TOTAL HIP ARTHROPLASTY Right 01/31/2017   Procedure: TOTAL HIP ARTHROPLASTY;  Surgeon: Mardee Lynwood SQUIBB, MD;  Location: [facility] ORS;  Service: Orthopedics;  Laterality: Right;   TUBAL LIGATION     UPPER GI ENDOSCOPY      Family History  Problem Relation Age of Onset   Asthma Mother    Breast cancer Mother 77   Emphysema Father        smoked   Lung cancer Father        smoked   Coronary artery disease Father 69    Social History:  reports that she has never smoked. She has never used smokeless tobacco. She reports that she does not drink alcohol and does not use drugs.  Allergies:  Allergies  Allergen Reactions   Amiodarone  Other (See Comments)    Fatigue, Nausea, anorexia, malaise   Lactose Other (See Comments) and Diarrhea   Other Other (See Comments)    Pt ONLY tolerates SLOW-FE (slow released iron )---upsets IBS   Biaxin [Clarithromycin] Other (See Comments)    GI upset    Hydrocodone -Acetaminophen  Itching    Tolerates acetaminophen     Oxycodone  Itching   Sulfa Antibiotics Other (See Comments) and Diarrhea    GI upset  GI upset GI upset    Sulfasalazine Diarrhea    Medications reviewed.    ROS Full ROS performed and is otherwise negative other than what is stated in HPI   BP 138/81   Pulse 81   Ht 5' 4 (1.626 m)   Wt 190 lb (86.2 kg)   SpO2 96%   BMI 32.61 kg/m   Physical Exam Alert and oriented x 3, normal work of breathing on room air, abdomen is soft, tender to palpation with large bulge in the periumbilical space.  The bulge is hard and unable to be  fully reduced.  There is no overlying skin changes.  Moving all extremities spontaneously.    No results found for this or any previous visit (from the past 48 hours). CT ABDOMEN PELVIS W CONTRAST Result Date: 09/04/2023 CLINICAL DATA:  Severe abdominal pain for 6 weeks. Umbilical hernia. Constipation. EXAM: CT ABDOMEN AND PELVIS WITH CONTRAST TECHNIQUE: Multidetector  CT imaging of the abdomen and pelvis was performed using the standard protocol following bolus administration of intravenous contrast. RADIATION DOSE REDUCTION: This exam was performed according to the departmental dose-optimization program which includes automated exposure control, adjustment of the mA and/or kV according to patient size and/or use of iterative reconstruction technique. CONTRAST:  80mL OMNIPAQUE  IOHEXOL  300 MG/ML  SOLN COMPARISON:  08/10/2023 FINDINGS: Lower Chest: No acute findings. Hepatobiliary: No suspicious hepatic masses identified. Gallbladder is unremarkable. No evidence of biliary ductal dilatation. Pancreas:  No mass or inflammatory changes. Spleen: Within normal limits in size and appearance. Adrenals/Urinary Tract: No suspicious masses identified. No evidence of ureteral calculi or hydronephrosis. Stomach/Bowel: Small hiatal hernia, without change. Sigmoid diverticulosis, without signs of diverticulitis. A moderate midline ventral hernia is seen containing a loop of transverse colon. This is causing marked dilatation of the proximal colon, consistent with colonic obstruction. No No evidence of small bowel dilatation. Normal appendix visualized. No evidence of free air or abscess. Vascular/Lymphatic: No pathologically enlarged lymph nodes. No acute vascular findings. 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 Reproductive:  No mass or other significant abnormality. Other:  None. Musculoskeletal:  Right hip prosthesis noted. IMPRESSION: Moderate midline ventral hernia containing a loop of transverse  colon, causing colonic obstruction. Sigmoid diverticulosis, without radiographic evidence of diverticulitis. Small hiatal hernia. Electronically Signed   By: Norleen DELENA Kil M.D.   On: 09/04/2023 12:46    Assessment/Plan:  1. Ventral hernia with obstruction or gangrene (Primary) Given that the patient is having increased pain with constipation and increased induration of the bulge I am concerned that she is obstructed or strangulated.  Obviously, I would like to try to wait to do this more elective fashion but I am concerned that we cannot wait.  I will get a CT scan today to see if there is evidence of obstruction within the hernia.  I will see her back in clinic this afternoon once the CT scan is done. - CT ABDOMEN PELVIS W CONTRAST; Future   Addendum: I have personally reviewed her CT images.  She does look like she has an obstruction within the hernia with dilated proximal ascending colon and decompressed distal transverse colon and sigmoid colon.  I do not see a large degree of dilation of the small bowel.  There may be a little bit of free fluid in the hernia sac with some inflammatory changes but is difficult to tell.  I do not see any evidence of free air.  Given the above findings I think we need to proceed to the operating room for our emergent ventral hernia repair.  I have extensively discussed the risk, benefits alternatives of the procedure including risk of infection, bleeding, need for bowel resection, need for ostomy, recurrence of hernia.  I also discussed briefly the anesthetic complications including pulmonary and cardiac complications given her history.  I also discussed again the risk of bleeding is higher given that she has not been off of Plavix  for 5 days.  With that being said she accepts these risks and wishes to proceed to the operating room for ventral hernia repair.  We will get a type and screen for her as well as cefotetan  for perioperative antibiotics.  A total of 60  minutes was spent reviewing the patient's chart, performing an interval history and physical and discussing treatment options with the patient.  Jayson Endow, M.D. [facility]

## 2023-09-04 NOTE — H&P (Signed)
 Outpatient Surgical Follow Up   09/04/2023   Courtney Wilcox is an 78 y.o. female.        Chief Complaint  Patient presents with   Follow-up      Hernia       HPI: The patient is a 78 year old female that presents in my office for follow-up of a ventral hernia.  She was seen in the ED and at that time is reducible.  When she last saw me it was also reducible and she was having just intermittent pain with it.  At that time the decision was made to pursue cardiac restratification and pulmonary restratification prior to surgical repair of the hernia.  She did get pulmonary restratification and they recommended no further workup.  There was discussion about waiting until September for her ventral hernia repair from a cardiac standpoint and that she would be okay to hold Plavix  for 5 days but she did not get formal cardiac restratification.  However, over the last 3 days she reports increased pain over the area.  She says that she feels like the bulge is harder.  She says that she has had difficulty eating given that she has cramping pain when she eats.  She denies having any episodes of vomiting.  She does report she has been passing flatus but she seems to have been more constipated over the last 3 days.   Her last dose of Plavix  was on Sunday morning.       Past Medical History:  Diagnosis Date   Allergic rhinitis     Anxiety     Arthritis     Asthma     Atrophic vaginitis     B12 deficiency     Bronchitis, chronic (HCC)     CAD (coronary artery disease)      a. 03/2003 Cath: Nl cors; b. 09/2022 Staged PCI: LM nl, LAD 85p/60m (3.5x26 Onyx Frontier DES), RI small, mild dzs, LCX nl, OM2 mild dzs, RCA 30p.   Cervical disc disease     CKD (chronic kidney disease), stage III (HCC)     Collagen vascular disease (HCC)      RA   Cough - post-COVID     Depression     Diastolic dysfunction      a. 11/2020 Echo: EF 45-50%, GrI DD; b. 10/2021 Echo: EF 60-65%, GrII DD; c. 09/2022 Echo: EF 55-60%,  no rwma, GrI DD, nl RV fxn, mildly dil LA. No significant valvular dzs.   Fibrocystic breast disease     Foot ulcer (HCC)     GERD (gastroesophageal reflux disease)     Hammer toe     Hyperlipidemia     IBS (irritable bowel syndrome)     Macrocytic anemia     Macular degeneration 2023   Obesity     OSA (obstructive sleep apnea)     Osteoarthrosis      Multi site   Osteopenia     PSVT (paroxysmal supraventricular tachycardia) (HCC)      a. 04/2022 Zio: Sinus rhythm @ 57 (42-101). Rare PACs/PVCs. 74290 SVT runs lasting up to 34m 12sec - max rate 222. 5 beats NSVT. Triggered events = sinus, SVT, PACs.   Rheumatoid arthritis (HCC)     Shingles     Thyroid  nodule      MULTIPLE   Tonsillitis      recurrent   Umbilical hernia  Past Surgical History:  Procedure Laterality Date   BACK SURGERY   2011    LAMINECTOMY   BREAST BIOPSY Right      neg   CERVICAL FUSION   2011, 2012    X 2   COLONOSCOPY   2005, 2015   COLONOSCOPY WITH PROPOFOL  N/A 08/04/2020    Procedure: COLONOSCOPY WITH PROPOFOL ;  Surgeon: Dessa Reyes ORN, MD;  Location: [facility] ENDOSCOPY;  Service: Endoscopy;  Laterality: N/A;   CORONARY STENT INTERVENTION N/A 10/02/2022    Procedure: CORONARY STENT INTERVENTION;  Surgeon: Darron Deatrice LABOR, MD;  Location: [facility] INVASIVE CV LAB;  Service: Cardiovascular;  Laterality: N/A;   ESOPHAGOGASTRODUODENOSCOPY (EGD) WITH PROPOFOL  N/A 09/26/2021    Procedure: ESOPHAGOGASTRODUODENOSCOPY (EGD) WITH PROPOFOL ;  Surgeon: Onita Elspeth Sharper, DO;  Location: [facility] ENDOSCOPY;  Service: Gastroenterology;  Laterality: N/A;   ESOPHAGOGASTRODUODENOSCOPY ENDOSCOPY   2005, 2013, 2015   EYE SURGERY Bilateral      Cataract Extraction with IOL    HERNIA REPAIR       JOINT REPLACEMENT       KNEE ARTHROPLASTY Left 07/12/2016    Procedure: COMPUTER ASSISTED TOTAL KNEE ARTHROPLASTY;  Surgeon: Mardee Lynwood SQUIBB, MD;  Location: [facility] ORS;  Service: Orthopedics;  Laterality: Left;    Laryngeal tear after intubation w/repair       LUMBAR DISC SURGERY   12/2009   REPLACEMENT TOTAL KNEE Right 06/2007    DR. HOOTEN, [facility]   REPLACEMENT UNICONDYLAR JOINT KNEE        DR. HOOTEN, [facility]   RIGHT/LEFT HEART CATH AND CORONARY ANGIOGRAPHY N/A 09/29/2022    Procedure: RIGHT/LEFT HEART CATH AND CORONARY ANGIOGRAPHY;  Surgeon: Darron Deatrice LABOR, MD;  Location: [facility] INVASIVE CV LAB;  Service: Cardiovascular;  Laterality: N/A;   SHOULDER ARTHROSCOPY WITH ROTATOR CUFF REPAIR Right     TONSILLECTOMY       TOTAL HIP ARTHROPLASTY Right 01/31/2017    Procedure: TOTAL HIP ARTHROPLASTY;  Surgeon: Mardee Lynwood SQUIBB, MD;  Location: [facility] ORS;  Service: Orthopedics;  Laterality: Right;   TUBAL LIGATION       UPPER GI ENDOSCOPY                   Family History  Problem Relation Age of Onset   Asthma Mother     Breast cancer Mother 69   Emphysema Father          smoked   Lung cancer Father          smoked   Coronary artery disease Father 66          Social History:  reports that she has never smoked. She has never used smokeless tobacco. She reports that she does not drink alcohol and does not use drugs.   Allergies:  Allergies       Allergies  Allergen Reactions   Amiodarone  Other (See Comments)      Fatigue, Nausea, anorexia, malaise   Lactose Other (See Comments) and Diarrhea   Other Other (See Comments)      Pt ONLY tolerates SLOW-FE (slow released iron )---upsets IBS   Biaxin [Clarithromycin] Other (See Comments)      GI upset     Hydrocodone -Acetaminophen  Itching      Tolerates acetaminophen     Oxycodone  Itching   Sulfa Antibiotics Other (See Comments) and Diarrhea      GI upset   GI upset GI upset     Sulfasalazine Diarrhea        Medications reviewed.  ROS Full ROS performed and is otherwise negative other than what is stated in HPI     BP 138/81   Pulse 81   Ht 5' 4 (1.626 m)   Wt 190 lb (86.2 kg)   SpO2 96%   BMI 32.61 kg/m    Physical  Exam Alert and oriented x 3, normal work of breathing on room air, abdomen is soft, tender to palpation with large bulge in the periumbilical space.  The bulge is hard and unable to be fully reduced.  There is no overlying skin changes.  Moving all extremities spontaneously.       Lab Results Last 48 Hours  No results found for this or any previous visit (from the past 48 hours).    Imaging Results (Last 48 hours)  CT ABDOMEN PELVIS W CONTRAST Result Date: 09/04/2023 CLINICAL DATA:  Severe abdominal pain for 6 weeks. Umbilical hernia. Constipation. EXAM: CT ABDOMEN AND PELVIS WITH CONTRAST TECHNIQUE: Multidetector CT imaging of the abdomen and pelvis was performed using the standard protocol following bolus administration of intravenous contrast. RADIATION DOSE REDUCTION: This exam was performed according to the departmental dose-optimization program which includes automated exposure control, adjustment of the mA and/or kV according to patient size and/or use of iterative reconstruction technique. CONTRAST:  80mL OMNIPAQUE  IOHEXOL  300 MG/ML  SOLN COMPARISON:  08/10/2023 FINDINGS: Lower Chest: No acute findings. Hepatobiliary: No suspicious hepatic masses identified. Gallbladder is unremarkable. No evidence of biliary ductal dilatation. Pancreas:  No mass or inflammatory changes. Spleen: Within normal limits in size and appearance. Adrenals/Urinary Tract: No suspicious masses identified. No evidence of ureteral calculi or hydronephrosis. Stomach/Bowel: Small hiatal hernia, without change. Sigmoid diverticulosis, without signs of diverticulitis. A moderate midline ventral hernia is seen containing a loop of transverse colon. This is causing marked dilatation of the proximal colon, consistent with colonic obstruction. No No evidence of small bowel dilatation. Normal appendix visualized. No evidence of free air or abscess. Vascular/Lymphatic: No pathologically enlarged lymph nodes. No acute vascular findings.  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 Reproductive:  No mass or other significant abnormality. Other:  None. Musculoskeletal:  Right hip prosthesis noted. IMPRESSION: Moderate midline ventral hernia containing a loop of transverse colon, causing colonic obstruction. Sigmoid diverticulosis, without radiographic evidence of diverticulitis. Small hiatal hernia. Electronically Signed   By: Norleen DELENA Kil M.D.   On: 09/04/2023 12:46       Assessment/Plan:   1. Ventral hernia with obstruction or gangrene (Primary) Given that the patient is having increased pain with constipation and increased induration of the bulge I am concerned that she is obstructed or strangulated.  Obviously, I would like to try to wait to do this more elective fashion but I am concerned that we cannot wait.  I will get a CT scan today to see if there is evidence of obstruction within the hernia.  I will see her back in clinic this afternoon once the CT scan is done. - CT ABDOMEN PELVIS W CONTRAST; Future     Addendum: I have personally reviewed her CT images.  She does look like she has an obstruction within the hernia with dilated proximal ascending colon and decompressed distal transverse colon and sigmoid colon.  I do not see a large degree of dilation of the small bowel.  There may be a little bit of free fluid in the hernia sac with some inflammatory changes but is difficult to tell.  I do not see any evidence of free air.  Given the above findings I think we need to proceed to the operating room for our emergent ventral hernia repair.  I have extensively discussed the risk, benefits alternatives of the procedure including risk of infection, bleeding, need for bowel resection, need for ostomy, recurrence of hernia.  I also discussed briefly the anesthetic complications including pulmonary and cardiac complications given her history.  I also discussed again the risk of bleeding is higher given that she has not been  off of Plavix  for 5 days.  With that being said she accepts these risks and wishes to proceed to the operating room for ventral hernia repair.  We will get a type and screen for her as well as cefotetan  for perioperative antibiotics.   A total of 60 minutes was spent reviewing the patient's chart, performing an interval history and physical and discussing treatment options with the patient.   Jayson Endow, M.D. [facility]

## 2023-09-04 NOTE — Anesthesia Preprocedure Evaluation (Signed)
 Anesthesia Evaluation  Patient identified by MRN, date of birth, ID band Patient awake    Reviewed: Allergy & Precautions, NPO status , Patient's Chart, lab work & pertinent test results  History of Anesthesia Complications (+) DIFFICULT AIRWAY and history of anesthetic complications  Airway Mallampati: III  TM Distance: <3 FB Neck ROM: limited    Dental  (+) Chipped, Poor Dentition, Missing, Upper Dentures   Pulmonary shortness of breath, asthma , sleep apnea , neg COPD, neg recent URI H/o pulmonary embolism and is on Eliquis . Held per cardiology recs   Pulmonary exam normal        Cardiovascular Exercise Tolerance: Good (-) hypertension(-) angina + CAD, + Cardiac Stents and +CHF (HFrEF)  (-) Past MI, (-) CABG and (-) DOE + dysrhythmias Supra Ventricular Tachycardia   H/o SVT   Neuro/Psych  PSYCHIATRIC DISORDERS Anxiety Depression    negative neurological ROS     GI/Hepatic Neg liver ROS,GERD  Medicated and Controlled,,  Endo/Other  negative endocrine ROS    Renal/GU Renal disease (CKD stage 3b)  negative genitourinary   Musculoskeletal  (+) Arthritis , Rheumatoid disorders,    Abdominal Normal abdominal exam  (+)   Peds  Hematology negative hematology ROS (+)   Anesthesia Other Findings Past Medical History: No date: Allergic rhinitis No date: Anemia No date: Anxiety No date: Arthritis No date: Asthma No date: Atrophic vaginitis No date: B12 deficiency No date: Bronchitis, chronic (HCC) No date: Cervical disc disease No date: Collagen vascular disease (HCC)     Comment:  RA No date: Depression No date: Fibrocystic breast disease No date: Foot ulcer (HCC) No date: GERD (gastroesophageal reflux disease) No date: Hammer toe No date: Hyperlipidemia No date: IBS (irritable bowel syndrome) No date: Obesity No date: Obstructive sleep apnea No date: OSA (obstructive sleep apnea) No date: Osteoarthrosis      Comment:  Multi site No date: Osteopenia No date: Rheumatoid arthritis (HCC) No date: Shingles No date: Thyroid  nodule     Comment:  MULTIPLE No date: Tonsillitis     Comment:  recurrent No date: Umbilical hernia  Past Surgical History: 2011: BACK SURGERY     Comment:  LAMINECTOMY No date: BREAST BIOPSY; Right     Comment:  neg 2011, 2012: CERVICAL FUSION     Comment:  X 2 2005, 2015: COLONOSCOPY 2005, 2013, 2015: ESOPHAGOGASTRODUODENOSCOPY ENDOSCOPY No date: EYE SURGERY; Bilateral     Comment:  Cataract Extraction with IOL  No date: HERNIA REPAIR No date: JOINT REPLACEMENT 07/12/2016: KNEE ARTHROPLASTY; Left     Comment:  Procedure: COMPUTER ASSISTED TOTAL KNEE ARTHROPLASTY;                Surgeon: Mardee Lynwood SQUIBB, MD;  Location: [facility] ORS;                Service: Orthopedics;  Laterality: Left; No date: Laryngeal tear after intubation w/repair 12/2009: LUMBAR DISC SURGERY 06/2007: REPLACEMENT TOTAL KNEE; Right     Comment:  DR. MARDEE, [facility] No date: REPLACEMENT UNICONDYLAR JOINT KNEE     Comment:  DR. MARDEE, [facility] No date: SHOULDER ARTHROSCOPY WITH ROTATOR CUFF REPAIR; Right No date: TONSILLECTOMY 01/31/2017: TOTAL HIP ARTHROPLASTY; Right     Comment:  Procedure: TOTAL HIP ARTHROPLASTY;  Surgeon: Mardee Lynwood SQUIBB, MD;  Location: [facility] ORS;  Service: Orthopedics;               Laterality:  Right; No date: TUBAL LIGATION No date: UPPER GI ENDOSCOPY  BMI    Body Mass Index: 35.90 kg/m      Reproductive/Obstetrics negative OB ROS                              Anesthesia Physical Anesthesia Plan  ASA: 3  Anesthesia Plan: General   Post-op Pain Management: Minimal or no pain anticipated   Induction: Intravenous and Rapid sequence  PONV Risk Score and Plan: Ondansetron , Dexamethasone , Midazolam  and Treatment may vary due to age or medical condition  Airway Management Planned: Natural Airway, Nasal Cannula and Video  Laryngoscope Planned  Additional Equipment:   Intra-op Plan:   Post-operative Plan: Extubation in OR  Informed Consent: I have reviewed the patients History and Physical, chart, labs and discussed the procedure including the risks, benefits and alternatives for the proposed anesthesia with the patient or authorized representative who has indicated his/her understanding and acceptance.     Dental Advisory Given  Plan Discussed with: Anesthesiologist, CRNA and Surgeon  Anesthesia Plan Comments: (Patient consented for risks of anesthesia including but not limited to:  - adverse reactions to medications - risk of airway placement if required - damage to eyes, teeth, lips or other oral mucosa - nerve damage due to positioning  - sore throat or hoarseness - Damage to heart, brain, nerves, lungs, other parts of body or loss of life  Patient voiced understanding.)         Anesthesia Quick Evaluation

## 2023-09-04 NOTE — Transfer of Care (Signed)
 Immediate Anesthesia Transfer of Care Note  Patient: Courtney Wilcox  Procedure(s) Performed: REPAIR, HERNIA, VENTRAL (Abdomen) INSERTION OF MESH  Patient Location: PACU  Anesthesia Type:General  Level of Consciousness: drowsy  Airway & Oxygen Therapy: Patient Spontanous Breathing and Patient connected to face mask oxygen  Post-op Assessment: Report given to RN  Post vital signs: stable  Last Vitals:  Vitals Value Taken Time  BP 136/62 09/04/23 18:09  Temp 37.8 C 09/04/23 18:10  Pulse 72 09/04/23 18:13  Resp 21 09/04/23 18:13  SpO2 100 % 09/04/23 18:13  Vitals shown include unfiled device data.  Last Pain:  Vitals:   09/04/23 1504  TempSrc: Tympanic  PainSc: 7          Complications: No notable events documented.

## 2023-09-04 NOTE — Anesthesia Procedure Notes (Signed)
 Procedure Name: Intubation Date/Time: 09/04/2023 4:06 PM  Performed by: Rosine Shona Jansky, CRNAPre-anesthesia Checklist: Patient identified, Emergency Drugs available, Suction available and Patient being monitored Patient Re-evaluated:Patient Re-evaluated prior to induction Oxygen Delivery Method: Circle system utilized Preoxygenation: Pre-oxygenation with 100% oxygen Induction Type: IV induction Ventilation: Mask ventilation without difficulty Laryngoscope Size: McGrath and 3 Grade View: Grade I Tube type: Oral Tube size: 7.0 mm Number of attempts: 1 Airway Equipment and Method: Stylet and Oral airway Placement Confirmation: ETT inserted through vocal cords under direct vision, positive ETCO2 and breath sounds checked- equal and bilateral Secured at: 22 cm Tube secured with: Tape Dental Injury: Teeth and Oropharynx as per pre-operative assessment  Comments: Atraumatic attempt x1 without difficult.  Large tongue noted.

## 2023-09-05 ENCOUNTER — Encounter: Payer: Self-pay | Admitting: General Surgery

## 2023-09-05 DIAGNOSIS — J42 Unspecified chronic bronchitis: Secondary | ICD-10-CM

## 2023-09-05 DIAGNOSIS — N1832 Chronic kidney disease, stage 3b: Secondary | ICD-10-CM | POA: Diagnosis not present

## 2023-09-05 DIAGNOSIS — E875 Hyperkalemia: Secondary | ICD-10-CM

## 2023-09-05 DIAGNOSIS — K436 Other and unspecified ventral hernia with obstruction, without gangrene: Secondary | ICD-10-CM | POA: Diagnosis not present

## 2023-09-05 LAB — GLUCOSE, CAPILLARY: Glucose-Capillary: 124 mg/dL — ABNORMAL HIGH (ref 70–99)

## 2023-09-05 LAB — BASIC METABOLIC PANEL WITH GFR
Anion gap: 8 (ref 5–15)
BUN: 19 mg/dL (ref 8–23)
CO2: 27 mmol/L (ref 22–32)
Calcium: 8.9 mg/dL (ref 8.9–10.3)
Chloride: 102 mmol/L (ref 98–111)
Creatinine, Ser: 1.27 mg/dL — ABNORMAL HIGH (ref 0.44–1.00)
GFR, Estimated: 44 mL/min — ABNORMAL LOW (ref >60)
Glucose, Bld: 140 mg/dL — ABNORMAL HIGH (ref 70–99)
Potassium: 5.4 mmol/L — ABNORMAL HIGH (ref 3.5–5.1)
Sodium: 137 mmol/L (ref 135–145)

## 2023-09-05 LAB — POTASSIUM
Potassium: 5.6 mmol/L — ABNORMAL HIGH (ref 3.5–5.1)
Potassium: 4.8 mmol/L (ref 3.5–5.1)

## 2023-09-05 LAB — CBC
HCT: 34.3 % — ABNORMAL LOW (ref 36.0–46.0)
Hemoglobin: 11.1 g/dL — ABNORMAL LOW (ref 12.0–15.0)
MCH: 33 pg (ref 26.0–34.0)
MCHC: 32.4 g/dL (ref 30.0–36.0)
MCV: 102.1 fL — ABNORMAL HIGH (ref 80.0–100.0)
Platelets: 184 K/uL (ref 150–400)
RBC: 3.36 MIL/uL — ABNORMAL LOW (ref 3.87–5.11)
RDW: 13.7 % (ref 11.5–15.5)
WBC: 11 K/uL — ABNORMAL HIGH (ref 4.0–10.5)
nRBC: 0 % (ref 0.0–0.2)

## 2023-09-05 MED ORDER — ORAL CARE MOUTH RINSE: 15.0000 mL | OROMUCOSAL | Status: DC | PRN

## 2023-09-05 MED ORDER — PATIROMER SORBITEX CALCIUM 8.4 G PO PACK
25.2000 g | PACK | Freq: Once | ORAL | Status: AC
  Administered 2023-09-05: 25.2 g via ORAL
  Filled 2023-09-05: qty 3

## 2023-09-05 MED ORDER — ALPRAZOLAM 0.5 MG PO TABS
0.5000 mg | ORAL_TABLET | Freq: Every evening | ORAL | Status: DC | PRN
  Administered 2023-09-05: 0.5 mg via ORAL
  Filled 2023-09-05: qty 1

## 2023-09-05 MED ORDER — DIPHENHYDRAMINE HCL 25 MG PO CAPS: 50.0000 mg | ORAL_CAPSULE | Freq: Three times a day (TID) | ORAL | Status: DC | PRN

## 2023-09-05 MED ORDER — SODIUM ZIRCONIUM CYCLOSILICATE 5 G PO PACK
5.0000 g | PACK | Freq: Every day | ORAL | Status: DC
  Filled 2023-09-05: qty 1

## 2023-09-05 MED ORDER — DIPHENHYDRAMINE HCL 50 MG/ML IJ SOLN
12.5000 mg | Freq: Four times a day (QID) | INTRAMUSCULAR | Status: DC | PRN
  Administered 2023-09-05: 12.5 mg via INTRAVENOUS
  Filled 2023-09-05: qty 1

## 2023-09-05 NOTE — Progress Notes (Signed)
Foley catheter removed per MD order.  Patient tolerated well.

## 2023-09-05 NOTE — Progress Notes (Addendum)
 CC: Incarcerated recurrent ventral hernia Subjective: Patient reports doing okay this morning.  She says she had some sips of water with meds without any nausea or vomiting.  She denies any bloating.  She denies any flatus.  She does report that she has some soreness when she is moving around.  Objective: Vital signs in last 24 hours: Temp:  [97.7 F (36.5 C)-100.1 F (37.8 C)] 98.4 F (36.9 C) (08/20 0424) Pulse Rate:  [62-136] 62 (08/20 0424) Resp:  [16-36] 19 (08/20 0424) BP: (109-157)/(49-94) 113/61 (08/20 0424) SpO2:  [91 %-100 %] 99 % (08/20 0424) Weight:  [86.2 kg] 86.2 kg (08/19 1504)    Intake/Output from previous day: 08/19 0701 - 08/20 0700 In: 1191.7 [P.O.:125; I.V.:966.7; IV Piggyback:100] Out: 1101 [Urine:1100; Blood:1] Intake/Output this shift: No intake/output data recorded.  Physical exam: Alert and oriented x 3, work of breathing on 2 L nasal cannula, abdomen is soft, obese, tender to palpation in the right lower abdomen and over the incision.  At the inferior part of the incision there is a little bit of blood that is dried underneath the dressing.  Lab Results: CBC  Recent Labs    09/05/23 0503  WBC 11.0*  HGB 11.1*  HCT 34.3*  PLT 184   BMET Recent Labs    09/05/23 0503  NA 137  K 5.4*  CL 102  CO2 27  GLUCOSE 140*  BUN 19  CREATININE 1.27*  CALCIUM  8.9   PT/INR No results for input(s): LABPROT, INR in the last 72 hours. ABG No results for input(s): PHART, HCO3 in the last 72 hours.  Invalid input(s): PCO2, PO2  Studies/Results: DG OR LOCAL ABDOMEN Result Date: 09/04/2023 CLINICAL DATA:  [id number] At risk for retained foreign body [id number] EXAM: OR LOCAL ABDOMEN COMPARISON:  09/04/2023 FINDINGS: Two supine frontal views of the abdomen and pelvis are obtained, excluding portions of the left flank by collimation. Right hip arthroplasty is again identified. Excreted contrast is seen within the bladder, which is decompressed with an  indwelling Foley catheter. There are no unexpected radiopaque foreign bodies identified. Distended ascending and proximal transverse colon are again noted as on recent CT. No small bowel dilatation. No acute bony abnormalities. IMPRESSION: 1. Foley catheter within the urinary bladder. 2. No unexpected radiopaque foreign body. Electronically Signed   By: Ozell Daring M.D.   On: 09/04/2023 17:52   CT ABDOMEN PELVIS W CONTRAST Result Date: 09/04/2023 CLINICAL DATA:  Severe abdominal pain for 6 weeks. Umbilical hernia. Constipation. EXAM: CT ABDOMEN AND PELVIS WITH CONTRAST TECHNIQUE: Multidetector CT imaging of the abdomen and pelvis was performed using the standard protocol following bolus administration of intravenous contrast. RADIATION DOSE REDUCTION: This exam was performed according to the departmental dose-optimization program which includes automated exposure control, adjustment of the mA and/or kV according to patient size and/or use of iterative reconstruction technique. CONTRAST:  80mL OMNIPAQUE  IOHEXOL  300 MG/ML  SOLN COMPARISON:  08/10/2023 FINDINGS: Lower Chest: No acute findings. Hepatobiliary: No suspicious hepatic masses identified. Gallbladder is unremarkable. No evidence of biliary ductal dilatation. Pancreas:  No mass or inflammatory changes. Spleen: Within normal limits in size and appearance. Adrenals/Urinary Tract: No suspicious masses identified. No evidence of ureteral calculi or hydronephrosis. Stomach/Bowel: Small hiatal hernia, without change. Sigmoid diverticulosis, without signs of diverticulitis. A moderate midline ventral hernia is seen containing a loop of transverse colon. This is causing marked dilatation of the proximal colon, consistent with colonic obstruction. No No evidence of small bowel dilatation. Normal appendix visualized.  No evidence of free air or abscess. Vascular/Lymphatic: No pathologically enlarged lymph nodes. No acute vascular findings. Stable small densely  calcified splenic artery aneurysms. Reproductive:  No mass or other significant abnormality. Other:  None. Musculoskeletal:  Right hip prosthesis noted. IMPRESSION: Moderate midline ventral hernia containing a loop of transverse colon, causing colonic obstruction. Sigmoid diverticulosis, without radiographic evidence of diverticulitis. Small hiatal hernia. Electronically Signed   By: Norleen DELENA Kil M.D.   On: 09/04/2023 12:46    Anti-infectives: Anti-infectives (From admission, onward)    Start     Dose/Rate Route Frequency Ordered Stop   09/05/23 0600  cefoTEtan  (CEFOTAN ) 2 g in sodium chloride  0.9 % 100 mL IVPB        2 g 200 mL/hr over 30 Minutes Intravenous On call to O.R. 09/04/23 1457 09/05/23 0204   09/04/23 2200  ceFAZolin  (ANCEF ) IVPB 2g/100 mL premix        2 g 200 mL/hr over 30 Minutes Intravenous Every 8 hours 09/04/23 1926 09/05/23 2159       Assessment/Plan:  Patient status post open incarcerated, recurrent ventral hernia repair on 19 August. Overall doing well this morning.   N: Scheduled Tylenol , as needed narcotics. Home Zoloft .  P: Incentive Spirometer, home inhalers C: Home diltiazem  for SVT. Hold plavix  for now.  GI: Okay for CLD diet now, bowel regimen.  RFEN: Allow fluids to run out after 24 hours, recheck K this afternoon given K level this AM, home sodium bicarb tablets. Remove foley catheter.  H/H: Hold plavix  for now ID: Peri-operative ancef  for 3 doses. MSK/Skin: Keep dressingon wound today. PT/OT consult, OOB and ambulate  Okay to restart Chemical DVT ppx. Hospitalist consult given medical comorbidities.   Jayson Endow, M.D. [facility]

## 2023-09-05 NOTE — Evaluation (Signed)
 Occupational Therapy Evaluation Patient Details Name: Courtney Wilcox MRN: [id number] DOB: October 03, 1945 Today's Date: 09/05/2023   History of Present Illness   78 yo F seen in office for outpatient srugrical follow up, CT abdomen showed moderate midline ventral hernia containing a loop of transverse colon, causing colonic obstruction. Admitted to hospital for emergent ventral hernia repair on 09/04/23. PMH: CAD, CKD, PSVT     Clinical Impressions Courtney Wilcox was seen for OT evaluation this date. Prior to hospital admission, pt was IND. Pt lives with daughter. Pt currently requires CGA exit bed. CGA + RW for ADL t/f ~15 ft. MOD A for LB access in sitting 2/2 abdominal pain. Pt would benefit from skilled OT to address noted impairments and functional limitations (see below for any additional details). Upon hospital discharge, recommend OT follow up.    If plan is discharge home, recommend the following:   A little help with walking and/or transfers;A little help with bathing/dressing/bathroom;Help with stairs or ramp for entrance     Functional Status Assessment   Patient has had a recent decline in their functional status and demonstrates the ability to make significant improvements in function in a reasonable and predictable amount of time.     Equipment Recommendations   BSC/3in1     Recommendations for Other Services         Precautions/Restrictions   Precautions Precautions: Fall Recall of Precautions/Restrictions: Intact Restrictions Weight Bearing Restrictions Per Provider Order: No     Mobility Bed Mobility Overal bed mobility: Needs Assistance Bed Mobility: Supine to Sit     Supine to sit: Contact guard          Transfers Overall transfer level: Needs assistance Equipment used: Rolling walker (2 wheels) Transfers: Sit to/from Stand Sit to Stand: Contact guard assist                  Balance Overall balance assessment: Needs  assistance Sitting-balance support: No upper extremity supported Sitting balance-Leahy Scale: Good     Standing balance support: Bilateral upper extremity supported Standing balance-Leahy Scale: Fair                             ADL either performed or assessed with clinical judgement   ADL Overall ADL's : Needs assistance/impaired                                       General ADL Comments: CGA + RW for toilet t/f. MOD A for LB access in sitting 2/2 abdominal pain.     Vision         Perception         Praxis         Pertinent Vitals/Pain Pain Assessment Pain Assessment: 0-10 Pain Score: 5  Pain Location: abdomen Pain Descriptors / Indicators: Aching, Grimacing, Discomfort Pain Intervention(s): Limited activity within patient's tolerance, Repositioned     Extremity/Trunk Assessment Upper Extremity Assessment Upper Extremity Assessment: Overall WFL for tasks assessed   Lower Extremity Assessment Lower Extremity Assessment: Generalized weakness       Communication Communication Communication: No apparent difficulties   Cognition Arousal: Alert Behavior During Therapy: WFL for tasks assessed/performed Cognition: No apparent impairments  Following commands: Intact       Cueing  General Comments   Cueing Techniques: Verbal cues      Exercises     Shoulder Instructions      Home Living Family/patient expects to be discharged to:: Private residence Living Arrangements: Children Available Help at Discharge: Family;Available 24 hours/day Type of Home: House Home Access: Stairs to enter [employer] of Steps: 1+1   Home Layout: One level     Bathroom Shower/Tub: Chief Strategy Officer: Standard     Home Equipment: Rollator (4 wheels);Shower seat;Grab bars - tub/shower;Grab bars - toilet          Prior Functioning/Environment Prior Level of  Function : Independent/Modified Independent             Mobility Comments: IND no AD use, PRN rollator use ADLs Comments: assist for tub t/f    OT Problem List: Decreased strength;Decreased range of motion;Decreased activity tolerance;Impaired balance (sitting and/or standing);Decreased safety awareness   OT Treatment/Interventions: Self-care/ADL training;Therapeutic exercise;Energy conservation;DME and/or AE instruction;Therapeutic activities;Patient/family education      OT Goals(Current goals can be found in the care plan section)   Acute Rehab OT Goals Patient Stated Goal: to go home OT Goal Formulation: With patient Time For Goal Achievement: 09/19/23 Potential to Achieve Goals: Good ADL Goals Pt Will Perform Grooming: Independently;standing Pt Will Perform Lower Body Dressing: Independently;sit to/from stand;with adaptive equipment Pt Will Transfer to Toilet: Independently;ambulating;regular height toilet   OT Frequency:  Min 3X/week    Co-evaluation              AM-PAC OT 6 Clicks Daily Activity     Outcome Measure Help from another person eating meals?: None Help from another person taking care of personal grooming?: A Little Help from another person toileting, which includes using toliet, bedpan, or urinal?: A Little Help from another person bathing (including washing, rinsing, drying)?: A Little Help from another person to put on and taking off regular upper body clothing?: A Little Help from another person to put on and taking off regular lower body clothing?: A Lot 6 Click Score: 18   End of Session Equipment Utilized During Treatment: Rolling walker (2 wheels)  Activity Tolerance: Patient tolerated treatment well Patient left: in chair;with call bell/phone within reach;with chair alarm set  OT Visit Diagnosis: Unsteadiness on feet (R26.81);Muscle weakness (generalized) (M62.81)                Time: 9152-9094 OT Time Calculation (min): 18  min Charges:  OT General Charges $OT Visit: 1 Visit OT Evaluation $OT Eval Moderate Complexity: 1 Mod OT Treatments $Self Care/Home Management : 8-22 mins  Courtney Wilcox, M.S. OTR/L  09/05/23, 9:57 AM  ascom [phone]

## 2023-09-05 NOTE — Consult Note (Signed)
 Initial Consultation Note   Patient: Courtney Wilcox FMW:[id number] DOB: 1945-03-28 PCP: Marikay Eva POUR, PA DOA: 09/04/2023 DOS: the patient was seen and examined on 09/05/2023 Primary service: Marinda Jayson KIDD, MD  Referring physician: Dr. Marinda. Reason for consult: Management of medical problems.  Assessment and Plan: Asthma/COPD. Condition appear to be stable, patient does not have any shortness of breath.  She is off oxygen.  Continue current treatment.  Chronic kidney disease stage IIIb. Hyperkalemia. Renal function appears to be stable, recheck potassium level today.  May give Lokelma  if still elevated. Patient was taking potassium supplement before admission, which has been discontinued.  Chronic diastolic congestive heart failure. Current condition stable, no exacerbation.  Class I obesity with BMI 32.62. Obstructive sleep apnea. Continue to follow-up  Post ventral hernia repair. Followed by general surgery, has started liquid diet   TRH will continue to follow the patient.  HPI: Courtney Wilcox is a 78 y.o. female with past medical history of chronic diastolic congestive heart failure, chronic kidney disease stage IIIb, obstructive sleep apnea, COPD/asthma, coronary artery disease, gastroesophageal reflux disease, dyslipidemia, who presents to the hospital for elective ventral hernia repair was performed on 8/19. Postoperatively, patient was started on liquid diet, no nausea vomiting.  Overall doing well  Review of Systems: As mentioned in the history of present illness. All other systems reviewed and are negative. Past Medical History:  Diagnosis Date   Allergic rhinitis    Anxiety    Arthritis    Asthma    Atrophic vaginitis    B12 deficiency    Bronchitis, chronic (HCC)    CAD (coronary artery disease)    a. 03/2003 Cath: Nl cors; b. 09/2022 Staged PCI: LM nl, LAD 85p/55m (3.5x26 Onyx Frontier DES), RI small, mild dzs, LCX nl, OM2 mild dzs, RCA 30p.    Cervical disc disease    CKD (chronic kidney disease), stage III (HCC)    Collagen vascular disease (HCC)    RA   Cough - post-COVID    Depression    Diastolic dysfunction    a. 11/2020 Echo: EF 45-50%, GrI DD; b. 10/2021 Echo: EF 60-65%, GrII DD; c. 09/2022 Echo: EF 55-60%, no rwma, GrI DD, nl RV fxn, mildly dil LA. No significant valvular dzs.   Fibrocystic breast disease    Foot ulcer (HCC)    GERD (gastroesophageal reflux disease)    Hammer toe    Hyperlipidemia    IBS (irritable bowel syndrome)    Macrocytic anemia    Macular degeneration 2023   Obesity    OSA (obstructive sleep apnea)    Osteoarthrosis    Multi site   Osteopenia    PSVT (paroxysmal supraventricular tachycardia) (HCC)    a. 04/2022 Zio: Sinus rhythm @ 57 (42-101). Rare PACs/PVCs. 74290 SVT runs lasting up to 68m 12sec - max rate 222. 5 beats NSVT. Triggered events = sinus, SVT, PACs.   Rheumatoid arthritis (HCC)    Shingles    Thyroid  nodule    MULTIPLE   Tonsillitis    recurrent   Umbilical hernia    Past Surgical History:  Procedure Laterality Date   BACK SURGERY  2011   LAMINECTOMY   BREAST BIOPSY Right    neg   CERVICAL FUSION  2011, 2012   X 2   COLONOSCOPY  2005, 2015   COLONOSCOPY WITH PROPOFOL  N/A 08/04/2020   Procedure: COLONOSCOPY WITH PROPOFOL ;  Surgeon: Dessa Reyes ORN, MD;  Location: [facility] ENDOSCOPY;  Service: Endoscopy;  Laterality: N/A;   CORONARY STENT INTERVENTION N/A 10/02/2022   Procedure: CORONARY STENT INTERVENTION;  Surgeon: Darron Deatrice LABOR, MD;  Location: [facility] INVASIVE CV LAB;  Service: Cardiovascular;  Laterality: N/A;   ESOPHAGOGASTRODUODENOSCOPY (EGD) WITH PROPOFOL  N/A 09/26/2021   Procedure: ESOPHAGOGASTRODUODENOSCOPY (EGD) WITH PROPOFOL ;  Surgeon: Onita Elspeth Sharper, DO;  Location: [facility] ENDOSCOPY;  Service: Gastroenterology;  Laterality: N/A;   ESOPHAGOGASTRODUODENOSCOPY ENDOSCOPY  2005, 2013, 2015   EYE SURGERY Bilateral    Cataract Extraction with IOL    HERNIA  REPAIR     JOINT REPLACEMENT     KNEE ARTHROPLASTY Left 07/12/2016   Procedure: COMPUTER ASSISTED TOTAL KNEE ARTHROPLASTY;  Surgeon: Mardee Lynwood SQUIBB, MD;  Location: [facility] ORS;  Service: Orthopedics;  Laterality: Left;   Laryngeal tear after intubation w/repair     LUMBAR DISC SURGERY  12/2009   REPLACEMENT TOTAL KNEE Right 06/2007   DR. HOOTEN, [facility]   REPLACEMENT UNICONDYLAR JOINT KNEE     DR. HOOTEN, [facility]   RIGHT/LEFT HEART CATH AND CORONARY ANGIOGRAPHY N/A 09/29/2022   Procedure: RIGHT/LEFT HEART CATH AND CORONARY ANGIOGRAPHY;  Surgeon: Darron Deatrice LABOR, MD;  Location: [facility] INVASIVE CV LAB;  Service: Cardiovascular;  Laterality: N/A;   SHOULDER ARTHROSCOPY WITH ROTATOR CUFF REPAIR Right    TONSILLECTOMY     TOTAL HIP ARTHROPLASTY Right 01/31/2017   Procedure: TOTAL HIP ARTHROPLASTY;  Surgeon: Mardee Lynwood SQUIBB, MD;  Location: [facility] ORS;  Service: Orthopedics;  Laterality: Right;   TUBAL LIGATION     UPPER GI ENDOSCOPY     Social History:  reports that she has never smoked. She has never used smokeless tobacco. She reports that she does not drink alcohol and does not use drugs.  Allergies  Allergen Reactions   Amiodarone  Other (See Comments)    Fatigue, Nausea, anorexia, malaise   Lactose Other (See Comments) and Diarrhea   Other Other (See Comments)    Pt ONLY tolerates SLOW-FE (slow released iron )---upsets IBS   Biaxin [Clarithromycin] Other (See Comments)    GI upset    Hydrocodone -Acetaminophen  Itching    Tolerates acetaminophen     Oxycodone  Itching   Sulfa Antibiotics Other (See Comments) and Diarrhea    GI upset  GI upset GI upset    Sulfasalazine Diarrhea    Family History  Problem Relation Age of Onset   Asthma Mother    Breast cancer Mother 86   Emphysema Father        smoked   Lung cancer Father        smoked   Coronary artery disease Father 23    Prior to Admission medications   Medication Sig Start Date End Date Taking? Authorizing Provider   acetaminophen  (TYLENOL ) 500 MG tablet Take 1,000 mg by mouth 3 (three) times daily as needed for mild pain.    Yes [provider]  Cholecalciferol  (VITAMIN D ) 2000 units tablet Take 2,000 Units by mouth daily.   Yes [provider]  diltiazem  (CARDIZEM  CD) 180 MG 24 hr capsule Take 1 capsule (180 mg total) by mouth in the morning and at bedtime. 06/27/23  Yes Cindie Ole DASEN, MD  Dupilumab 300 MG/2ML SOPN Inject 4 mLs into the skin every 14 (fourteen) days. 12/15/21  Yes [provider]  famotidine  (PEPCID ) 20 MG tablet Take 20 mg by mouth daily as needed.   Yes [provider]  Fluticasone -Umeclidin-Vilant 100-62.5-25 MCG/ACT AEPB Inhale into the lungs. Inhale 1 Puff into the lungs once daily 12/12/22  Yes [provider]  gabapentin  (NEURONTIN ) 300 MG capsule Take 300 mg by mouth at bedtime.   Yes [provider]  leflunomide  (ARAVA ) 10 MG tablet Take 10 mg by mouth daily. 06/30/20  Yes [provider]  levalbuterol  (XOPENEX ) 0.63 MG/3ML nebulizer solution Take 0.63 mg by nebulization every 6 (six) hours as needed for wheezing. 04/25/21  Yes [provider]  potassium chloride  SA (KLOR-CON  M) 20 MEQ tablet Take 20 mEq by mouth. 05/11/23 05/10/24 Yes [provider]  predniSONE  (DELTASONE ) 2.5 MG tablet Take 2 tablets (5 mg total) by mouth daily with breakfast. Resume after finishing high doses of prednisone  04/27/23  Yes Caleen Qualia, MD  sertraline  (ZOLOFT ) 25 MG tablet Take 50 mg by mouth daily. 50 mg total 10/16/22 10/16/23 Yes [provider]  Simethicone  180 MG CAPS Take 1-2 capsules by mouth as needed.   Yes [provider]  triamcinolone  (NASACORT ) 55 MCG/ACT AERO nasal inhaler Place 2 sprays into the nose daily.   Yes [provider]  ALPRAZolam  (XANAX ) 0.5 MG tablet Take 1 tablet (0.5 mg total) by mouth at bedtime as needed for sleep or anxiety. Patient not taking: Reported on  09/04/2023 10/03/22   Barbarann Nest, MD  clopidogrel  (PLAVIX ) 75 MG tablet TAKE 1 TABLET BY MOUTH DAILY WITH BREAKFAST. 06/08/23   Vivienne Lonni Ingle, NP  Cyanocobalamin 1000 MCG/ML KIT Inject as directed every 30 (thirty) days.    [provider]  EPINEPHrine  0.3 mg/0.3 mL IJ SOAJ injection Inject 0.3 mg into the muscle as needed. 11/29/21   [provider]  meclizine (ANTIVERT) 12.5 MG tablet PLEASE SEE ATTACHED FOR DETAILED DIRECTIONS 10/05/18   [provider]  methylPREDNISolone  (MEDROL  DOSEPAK) 4 MG TBPK tablet Take by mouth. Patient not taking: Reported on 09/04/2023 07/11/23   [provider]  montelukast  (SINGULAIR ) 10 MG tablet Take 10 mg by mouth daily as needed (for allergies.).     [provider]  nystatin  (MYCOSTATIN ) 100000 UNIT/ML suspension Take 5 mLs by mouth as needed. 06/21/21   [provider]  pantoprazole  (PROTONIX ) 40 MG tablet Take 1 tablet (40 mg total) by mouth 2 (two) times daily. Patient taking differently: Take 40 mg by mouth daily. 10/03/22   Barbarann Nest, MD  sodium bicarbonate  650 MG tablet Take 1 tablet (650 mg total) by mouth 2 (two) times daily. 04/27/23   Caleen Qualia, MD    Physical Exam: Vitals:   09/04/23 2043 09/04/23 2145 09/05/23 0424 09/05/23 0853  BP: 133/71 109/64 113/61 136/73  Pulse: 64 67 62 60  Resp: 16 19 19 18   Temp: (P) 98.5 F (36.9 C) 98.3 F (36.8 C) 98.4 F (36.9 C) 98.2 F (36.8 C)  TempSrc: (P) Oral     SpO2: 99% 99% 99% 97%  Weight:      Height:       Physical Exam Constitutional:      General: She is not in acute distress.    Appearance: She is obese. She is not ill-appearing or toxic-appearing.  HENT:     Head: Normocephalic and atraumatic.     Nose: Nose normal. No congestion.     Mouth/Throat:     Mouth: Mucous membranes are moist.     Pharynx: Oropharynx is clear.  Eyes:     Conjunctiva/sclera: Conjunctivae normal.     Pupils: Pupils are equal, round,  and reactive to light.  Cardiovascular:     Rate and Rhythm: Normal rate and regular rhythm.     Heart sounds:  No murmur heard. Pulmonary:     Effort: Pulmonary effort is normal.     Comments: Mildly decreased breath sounds. Abdominal:     General: Abdomen is flat. There is no distension.     Palpations: Abdomen is soft.     Tenderness: There is no abdominal tenderness.  Musculoskeletal:        General: No swelling or tenderness. Normal range of motion.     Cervical back: Normal range of motion and neck supple. No rigidity.     Right lower leg: No edema.     Left lower leg: No edema.  Lymphadenopathy:     Cervical: No cervical adenopathy.  Skin:    General: Skin is warm and dry.     Coloration: Skin is not jaundiced.  Neurological:     General: No focal deficit present.     Mental Status: She is alert and oriented to person, place, and time.     Cranial Nerves: No cranial nerve deficit.  Psychiatric:        Mood and Affect: Mood normal.        Behavior: Behavior normal.     Data Reviewed:   Results reviewed.   Family Communication: None Primary team communication:  Thank you very much for involving us  in the care of your patient.  Author: Murvin Mana, MD 09/05/2023 12:15 PM  For on call review [email or web address].

## 2023-09-05 NOTE — Evaluation (Signed)
 Physical Therapy Evaluation Patient Details Name: Courtney Wilcox MRN: [id number] DOB: 1945-05-28 Today's Date: 09/05/2023  History of Present Illness  78 yo admitted to hospital for emergent ventral hernia repair on 09/04/23. PMH: CAD, CKD, PSVT  Clinical Impression  Patient received in recliner, daughter at bedside. Patient is pleasant and agrees to PT assessment. She is hoh and has macular degeneration. Patient requires cues for safety with transfers ( hand placement) with minimal follow through. She had 1 posterior lob while standing at recliner with unexpected sitting back into recliner. She was able to ambulate ~100 feet with RW and cga. Mod fatigue with this, minimal pain. She will continue to benefit from skilled PT to improve independence, endurance and safety.          If plan is discharge home, recommend the following: A little help with walking and/or transfers;A little help with bathing/dressing/bathroom;Assistance with cooking/housework;Assist for transportation;Help with stairs or ramp for entrance   Can travel by private vehicle    yes    Equipment Recommendations None recommended by PT  Recommendations for Other Services       Functional Status Assessment Patient has had a recent decline in their functional status and demonstrates the ability to make significant improvements in function in a reasonable and predictable amount of time.     Precautions / Restrictions Precautions Precautions: Fall Recall of Precautions/Restrictions: Intact Restrictions Weight Bearing Restrictions Per Provider Order: No      Mobility  Bed Mobility               General bed mobility comments: NT patient received in recliner and remained up in recliner    Transfers Overall transfer level: Needs assistance Equipment used: Rolling walker (2 wheels) Transfers: Sit to/from Stand Sit to Stand: Contact guard assist, Min assist           General transfer comment: patient  requires cues for safe hand placement, does not consistently follow cues. Had one posterior LOB in standing trying to get gown on, unexpectedly sat back down in recliner.    Ambulation/Gait Ambulation/Gait assistance: Contact guard assist Gait Distance (Feet): 100 Feet Assistive device: Rolling walker (2 wheels) Gait Pattern/deviations: Step-through pattern, Decreased step length - right, Decreased step length - left, Decreased stride length Gait velocity: decr     General Gait Details: patient fatigued with ambulation, mild pain.  Stairs            Wheelchair Mobility     Tilt Bed    Modified Rankin (Stroke Patients Only)       Balance Overall balance assessment: Needs assistance Sitting-balance support: Feet supported Sitting balance-Leahy Scale: Good     Standing balance support: Bilateral upper extremity supported, During functional activity, Reliant on assistive device for balance Standing balance-Leahy Scale: Fair Standing balance comment: 1 lob in standing at recliner. Sat back down unexpectedly.                             Pertinent Vitals/Pain Pain Assessment Pain Assessment: Faces Faces Pain Scale: Hurts a little bit Pain Location: abdomen Pain Descriptors / Indicators: Discomfort, Sore Pain Intervention(s): Monitored during session, Repositioned    Home Living Family/patient expects to be discharged to:: Private residence Living Arrangements: Children Available Help at Discharge: Family;Available 24 hours/day Type of Home: House Home Access: Stairs to enter Entrance Stairs-Rails: Right Entrance Stairs-Number of Steps: 1   Home Layout: One level Home Equipment: Rollator (4 wheels);Shower  seat;Grab bars - tub/shower;Grab bars - toilet;Rolling Walker (2 wheels)      Prior Function Prior Level of Function : Independent/Modified Independent             Mobility Comments: IND no AD use, PRN rollator use ADLs Comments: assist for  tub t/f     Extremity/Trunk Assessment   Upper Extremity Assessment Upper Extremity Assessment: Defer to OT evaluation    Lower Extremity Assessment Lower Extremity Assessment: Generalized weakness    Cervical / Trunk Assessment Cervical / Trunk Assessment: Normal  Communication   Communication Communication: No apparent difficulties Factors Affecting Communication: Hearing impaired;Other (comment) (vision impaired- macular degeneration)    Cognition Arousal: Alert Behavior During Therapy: WFL for tasks assessed/performed   PT - Cognitive impairments: No apparent impairments                         Following commands: Intact       Cueing Cueing Techniques: Verbal cues     General Comments      Exercises     Assessment/Plan    PT Assessment Patient needs continued PT services  PT Problem List Decreased strength;Decreased activity tolerance;Decreased mobility;Pain;Decreased safety awareness       PT Treatment Interventions DME instruction;Gait training;Stair training;Functional mobility training;Therapeutic activities;Therapeutic exercise;Patient/family education;Balance training    PT Goals (Current goals can be found in the Care Plan section)  Acute Rehab PT Goals Patient Stated Goal: return home PT Goal Formulation: With patient/family Time For Goal Achievement: 09/12/23 Potential to Achieve Goals: Good    Frequency Min 2X/week     Co-evaluation               AM-PAC PT 6 Clicks Mobility  Outcome Measure Help needed turning from your back to your side while in a flat bed without using bedrails?: A Little Help needed moving from lying on your back to sitting on the side of a flat bed without using bedrails?: A Little Help needed moving to and from a bed to a chair (including a wheelchair)?: A Little Help needed standing up from a chair using your arms (e.g., wheelchair or bedside chair)?: A Little Help needed to walk in hospital  room?: A Little Help needed climbing 3-5 steps with a railing? : A Little 6 Click Score: 18    End of Session Equipment Utilized During Treatment: Gait belt Activity Tolerance: Patient tolerated treatment well Patient left: in chair;with call bell/phone within reach;with chair alarm set;with family/visitor present Nurse Communication: Mobility status PT Visit Diagnosis: Unsteadiness on feet (R26.81);Other abnormalities of gait and mobility (R26.89);Muscle weakness (generalized) (M62.81);Difficulty in walking, not elsewhere classified (R26.2);Pain Pain - part of body:  (abdomen)    Time: 8885-8865 PT Time Calculation (min) (ACUTE ONLY): 20 min   Charges:   PT Evaluation $PT Eval Moderate Complexity: 1 Mod PT Treatments $Gait Training: 8-22 mins PT General Charges $$ ACUTE PT VISIT: 1 Visit        Charlisha Market, PT, GCS 09/05/23,11:48 AM

## 2023-09-06 DIAGNOSIS — E875 Hyperkalemia: Secondary | ICD-10-CM | POA: Insufficient documentation

## 2023-09-06 DIAGNOSIS — N1832 Chronic kidney disease, stage 3b: Secondary | ICD-10-CM | POA: Diagnosis not present

## 2023-09-06 DIAGNOSIS — G4733 Obstructive sleep apnea (adult) (pediatric): Secondary | ICD-10-CM | POA: Insufficient documentation

## 2023-09-06 DIAGNOSIS — K436 Other and unspecified ventral hernia with obstruction, without gangrene: Secondary | ICD-10-CM | POA: Diagnosis not present

## 2023-09-06 LAB — BASIC METABOLIC PANEL WITH GFR
Anion gap: 9 (ref 5–15)
BUN: 20 mg/dL (ref 8–23)
CO2: 27 mmol/L (ref 22–32)
Calcium: 9.1 mg/dL (ref 8.9–10.3)
Chloride: 102 mmol/L (ref 98–111)
Creatinine, Ser: 1.4 mg/dL — ABNORMAL HIGH (ref 0.44–1.00)
GFR, Estimated: 39 mL/min — ABNORMAL LOW (ref >60)
Glucose, Bld: 107 mg/dL — ABNORMAL HIGH (ref 70–99)
Potassium: 4.7 mmol/L (ref 3.5–5.1)
Sodium: 138 mmol/L (ref 135–145)

## 2023-09-06 LAB — CBC
HCT: 32.3 % — ABNORMAL LOW (ref 36.0–46.0)
Hemoglobin: 10.3 g/dL — ABNORMAL LOW (ref 12.0–15.0)
MCH: 32.9 pg (ref 26.0–34.0)
MCHC: 31.9 g/dL (ref 30.0–36.0)
MCV: 103.2 fL — ABNORMAL HIGH (ref 80.0–100.0)
Platelets: 191 K/uL (ref 150–400)
RBC: 3.13 MIL/uL — ABNORMAL LOW (ref 3.87–5.11)
RDW: 13.7 % (ref 11.5–15.5)
WBC: 11.9 K/uL — ABNORMAL HIGH (ref 4.0–10.5)
nRBC: 0 % (ref 0.0–0.2)

## 2023-09-06 NOTE — TOC Initial Note (Signed)
 Transition of Care [facility]) - Initial/Assessment Note    Patient Details  Name: Courtney Wilcox MRN: [id number] Date of Birth: 1945/05/21  Transition of Care [facility]) CM/SW Contact:    Asberry CHRISTELLA Jaksch, RN Phone Number: 09/06/2023, 12:58 PM  Clinical Narrative:                 Admitted for: Hernia  Admitted from: Home  PCP: Marikay Eva POUR, PA Current home health/prior home health/DME: Per patients daughter Courtney Wilcox patient owns a RW and rollator.   Spoke with patient and daughter Courtney Wilcox at beside. Patient states she lives with her daughter Courtney Wilcox and her daughter assists with her care and brings her to medical appointments. Per her daughter Courtney Wilcox, she owns a RW and rollator. I discussed recommendations for [facility] PT/OT. Patient and daughter decline services at this time. Daughter Courtney Wilcox will provide transport at DC.  Expected Discharge Plan: Home/Self Care Barriers to Discharge: Continued Medical Work up   Patient Goals and CMS Choice Patient states their goals for this hospitalization and ongoing recovery are:: Going home          Expected Discharge Plan and Services                                   HH Arranged: Patient Refused HH          Prior Living Arrangements/Services   Lives with:: Adult Children (Lives with daughter Courtney Wilcox) Patient language and need for interpreter reviewed:: Yes Do you feel safe going back to the place where you live?: Yes      Need for Family Participation in Patient Care: Yes (Comment) Care giver support system in place?: Yes (comment) Current home services: DME (Per daughter Courtney Wilcox, patient owns a Therapist, occupational)    Activities of Daily Living   ADL Screening (condition at time of admission) Independently performs ADLs?: Yes (appropriate for developmental age) Is the patient deaf or have difficulty hearing?: No Does the patient have difficulty seeing, even when wearing glasses/contacts?: Yes Does the patient have difficulty  concentrating, remembering, or making decisions?: No  Permission Sought/Granted                  Emotional Assessment Appearance:: Appears stated age     Orientation: : Oriented to Self, Oriented to Place, Oriented to  Time, Oriented to Situation Alcohol / Substance Use: Never Used    Admission diagnosis:  Incarcerated ventral hernia [K43.6] Patient Active Problem List   Diagnosis Date Noted   Incarcerated ventral hernia 09/04/2023   Acute respiratory failure with hypoxia (HCC) 04/25/2023   NSTEMI (non-ST elevated myocardial infarction) (HCC) 04/25/2023   SIRS (systemic inflammatory response syndrome) (HCC) 04/25/2023   Respiratory infection 04/25/2023   Sepsis (HCC) 04/25/2023   Coronary artery disease involving native coronary artery of native heart 09/30/2022   Class 1 obesity due to excess calories with body mass index (BMI) of 34.0 to 34.9 in adult 09/28/2022   Unstable angina (HCC) 09/28/2022   Coronary artery disease due to calcified coronary lesion 09/28/2022   Chronic kidney disease, stage 3b (HCC) 09/28/2022   GERD without esophagitis 09/28/2022   History of pulmonary embolism 01/20/2022   Anemia in CKD (chronic kidney disease) 12/09/2021   LVH (left ventricular hypertrophy) due to hypertensive disease, without heart failure 09/15/2021   SVT (supraventricular tachycardia) (HCC) 11/19/2020   History of atrial flutter 11/19/2020   Chronic venous insufficiency 08/15/2020  Carotid stenosis 02/04/2020   Hyperlipidemia LDL goal <70 02/04/2020   Multiple thyroid  nodules 05/21/2018   Thrombocytopenia (HCC) 07/31/2017   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/2017   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   Primary osteoarthritis of right hip 12/13/2016   S/P total knee arthroplasty 07/12/2016   Anemia of chronic disease 10/30/2013   Anxiety 10/30/2013   Osteoarthritis 07/08/2013   Osteopenia 07/08/2013   Eosinophilic asthma  97/95/7984   Chest pain 02/19/2013   Rheumatoid arthritis (HCC) 10/30/2012   Shortness of breath 10/29/2012   Spinal stenosis in cervical region 10/29/2012   PCP:  Marikay Eva POUR, PA Pharmacy:   [phone], [place] - [address] [place] [address] Phone: [phone] Fax: [phone]     Social Drivers of Health (SDOH) Social History: SDOH Screenings   Food Insecurity: No Food Insecurity (09/04/2023)  Housing: Low Risk  (09/04/2023)  Transportation Needs: No Transportation Needs (09/04/2023)  Utilities: Not At Risk (09/04/2023)  Financial Resource Strain: Low Risk  (06/21/2023)   Received from [facility]  Social Connections: Moderately Isolated (09/04/2023)  Tobacco Use: Low Risk  (09/04/2023)   SDOH Interventions:     Readmission Risk Interventions     No data to display

## 2023-09-06 NOTE — Progress Notes (Signed)
  Progress Note   Patient: Courtney Wilcox FMW:[id number] DOB: 30-Aug-1945 DOA: 09/04/2023     2 DOS: the patient was seen and examined on 09/06/2023   Brief hospital course: Courtney Wilcox is a 78 y.o. female with past medical history of chronic diastolic congestive heart failure, chronic kidney disease stage IIIb, obstructive sleep apnea, COPD/asthma, coronary artery disease, gastroesophageal reflux disease, dyslipidemia, who presents to the hospital for elective ventral hernia repair was performed on 8/19.    Principal Problem:   Incarcerated ventral hernia Active Problems:   Anemia of chronic disease   Class 1 obesity due to excess calories with body mass index (BMI) of 34.0 to 34.9 in adult   Coronary artery disease due to calcified coronary lesion   CKD stage 3b, GFR 30-44 ml/min (HCC)   OSA (obstructive sleep apnea)   Assessment and Plan:  Asthma/COPD. Condition appear to be stable, patient does not have any shortness of breath.  Condition still stable.   Chronic kidney disease stage IIIb. Hyperkalemia. Patient was taking potassium supplement before admission, which has been discontinued.  Calcium  had normalized after given a dose of Velt[place] . Renal function still stable.   Chronic diastolic congestive heart failure. Condition stable.   Class I obesity with BMI 32.62. Obstructive sleep apnea. Continue to follow-up   Post ventral hernia repair. Had a bowel movement yesterday, diet advanced to full liquid.        Subjective:  Well, no abdominal pain or shortness of breath.  Had a bowel movement yesterday  Physical Exam: Vitals:   09/05/23 1744 09/05/23 2046 09/06/23 0423 09/06/23 0855  BP: 127/76 (!) 140/65 (!) 139/56 (!) 153/63  Pulse: 64 66 65 (!) 56  Resp: 18 16 16 18   Temp: 98.6 F (37 C) 98.2 F (36.8 C) 97.7 F (36.5 C) 97.9 F (36.6 C)  TempSrc:    Oral  SpO2: 99% 98% 95% 97%  Weight:      Height:       General exam: Appears calm and  comfortable  Respiratory system: Decreased breathing sounds. Respiratory effort normal. Cardiovascular system: S1 & S2 heard, RRR. No JVD, murmurs, rubs, gallops or clicks. No pedal edema. Gastrointestinal system: Abdomen is nondistended, soft and nontender. No organomegaly or masses felt. Normal bowel sounds heard. Central nervous system: Alert and oriented. No focal neurological deficits. Extremities: Symmetric 5 x 5 power. Skin: No rashes, lesions or ulcers Psychiatry: Judgement and insight appear normal. Mood & affect appropriate.    Data Reviewed:  Lab results reviewed  Family Communication: None  Disposition: Status is: Inpatient Remains inpatient appropriate because: Per general surgery     Time spent: 35 minutes  Author: Murvin Mana, MD 09/06/2023 1:18 PM  For on call review [email or web address].

## 2023-09-06 NOTE — Progress Notes (Signed)
 Physical Therapy Treatment Patient Details Name: Courtney Wilcox MRN: [id number] DOB: 04/15/1945 Today's Date: 09/06/2023   History of Present Illness 78 yo admitted to hospital for emergent ventral hernia repair on 09/04/23. PMH: CAD, CKD, PSVT    PT Comments  Patient received coming out of room with OT and daughter. Patient ambulated ~ 100 feet with RW and cga/supervision. No Lob. She performed seated exercises with cues. Patient will continue to benefit from skilled PT to improve strength, endurance and independence.     If plan is discharge home, recommend the following: A little help with walking and/or transfers;A little help with bathing/dressing/bathroom;Assistance with cooking/housework;Assist for transportation;Help with stairs or ramp for entrance   Can travel by private vehicle      yes  Equipment Recommendations  None recommended by PT    Recommendations for Other Services       Precautions / Restrictions Precautions Precautions: Fall Recall of Precautions/Restrictions: Intact Restrictions Weight Bearing Restrictions Per Provider Order: No     Mobility  Bed Mobility               General bed mobility comments: NT patient out of bed on arrival    Transfers Overall transfer level: Needs assistance Equipment used: Rolling walker (2 wheels) Transfers: Sit to/from Stand Sit to Stand: Contact guard assist                Ambulation/Gait Ambulation/Gait assistance: Contact guard assist, Supervision Gait Distance (Feet): 100 Feet Assistive device: Rolling walker (2 wheels) Gait Pattern/deviations: Step-through pattern, Decreased step length - right, Decreased step length - left, Decreased stride length Gait velocity: decr     General Gait Details: patient fatigued with ambulation, mild pain.   Stairs             Wheelchair Mobility     Tilt Bed    Modified Rankin (Stroke Patients Only)       Balance Overall balance assessment:  Needs assistance Sitting-balance support: Feet supported Sitting balance-Leahy Scale: Good     Standing balance support: Bilateral upper extremity supported, During functional activity, Reliant on assistive device for balance Standing balance-Leahy Scale: Fair                              Communication Communication Communication: Impaired Factors Affecting Communication: Hearing impaired;Other (comment) (vision impaired)  Cognition Arousal: Alert Behavior During Therapy: WFL for tasks assessed/performed   PT - Cognitive impairments: No apparent impairments                         Following commands: Intact      Cueing Cueing Techniques: Verbal cues  Exercises Other Exercises Other Exercises: Performed seated exercises B LE: LAQ, marching, AP    General Comments        Pertinent Vitals/Pain Pain Assessment Pain Assessment: Faces Faces Pain Scale: Hurts little more Pain Location: abdomen Pain Descriptors / Indicators: Discomfort, Sore Pain Intervention(s): Monitored during session, Repositioned    Home Living                          Prior Function            PT Goals (current goals can now be found in the care plan section) Acute Rehab PT Goals Patient Stated Goal: return home PT Goal Formulation: With patient/family Time For Goal Achievement: 09/12/23 Potential to Achieve  Goals: Good Progress towards PT goals: Progressing toward goals    Frequency    Min 2X/week      PT Plan      Co-evaluation              AM-PAC PT 6 Clicks Mobility   Outcome Measure  Help needed turning from your back to your side while in a flat bed without using bedrails?: A Little Help needed moving from lying on your back to sitting on the side of a flat bed without using bedrails?: A Little Help needed moving to and from a bed to a chair (including a wheelchair)?: A Little Help needed standing up from a chair using your arms (e.g.,  wheelchair or bedside chair)?: A Little Help needed to walk in hospital room?: A Little Help needed climbing 3-5 steps with a railing? : A Little 6 Click Score: 18    End of Session   Activity Tolerance: Patient tolerated treatment well Patient left: in chair;with call bell/phone within reach;with family/visitor present Nurse Communication: Mobility status PT Visit Diagnosis: Muscle weakness (generalized) (M62.81);Difficulty in walking, not elsewhere classified (R26.2);Pain Pain - part of body:  (abdomen)     Time: 8599-8581 PT Time Calculation (min) (ACUTE ONLY): 18 min  Charges:    $Gait Training: 8-22 mins PT General Charges $$ ACUTE PT VISIT: 1 Visit                     Trentan Trippe, PT, GCS 09/06/23,2:36 PM

## 2023-09-06 NOTE — Progress Notes (Signed)
 Occupational Therapy Treatment Patient Details Name: Courtney Wilcox MRN: [id number] DOB: 1945-09-03 Today's Date: 09/06/2023   History of present illness 78 yo admitted to hospital for emergent ventral hernia repair on 09/04/23. PMH: CAD, CKD, PSVT   OT comments  Courtney Wilcox was seen for OT treatment on this date. Upon arrival to room pt standing with daughter, agreeable to tx. Pt requires CGA + RW for ADL t/f ~100 ft. MOD A don B socks and underwear in sitting 2/2 abdominal pain. Pt making good progress toward goals, will continue to follow POC. Discharge recommendation remains appropriate.       If plan is discharge home, recommend the following:  A little help with walking and/or transfers;A little help with bathing/dressing/bathroom;Help with stairs or ramp for entrance   Equipment Recommendations  BSC/3in1    Recommendations for Other Services      Precautions / Restrictions Precautions Precautions: Fall Recall of Precautions/Restrictions: Intact Restrictions Weight Bearing Restrictions Per Provider Order: No       Mobility Bed Mobility               General bed mobility comments: not tested    Transfers Overall transfer level: Needs assistance Equipment used: Rolling walker (2 wheels) Transfers: Sit to/from Stand Sit to Stand: Contact guard assist                 Balance Overall balance assessment: Needs assistance Sitting-balance support: Feet supported Sitting balance-Leahy Scale: Good     Standing balance support: Single extremity supported, During functional activity Standing balance-Leahy Scale: Fair                             ADL either performed or assessed with clinical judgement   ADL Overall ADL's : Needs assistance/impaired                                       General ADL Comments: CGA + RW for toilet t/f. MOD A don B socks and underwear in sitting 2/2 abdominal pain.    Extremity/Trunk Assessment               Occupational psychologist Communication: Impaired Factors Affecting Communication: Hearing impaired   Cognition Arousal: Alert Behavior During Therapy: WFL for tasks assessed/performed Cognition: No apparent impairments                               Following commands: Intact        Cueing   Cueing Techniques: Verbal cues  Exercises      Shoulder Instructions       General Comments      Pertinent Vitals/ Pain       Pain Assessment Pain Assessment: Faces Faces Pain Scale: Hurts little more Pain Location: abdomen Pain Descriptors / Indicators: Discomfort, Sore Pain Intervention(s): Limited activity within patient's tolerance, Repositioned  Home Living                                          Prior Functioning/Environment              Frequency  Min 3X/week        Progress Toward Goals  OT Goals(current goals can now be found in the care plan section)  Progress towards OT goals: Progressing toward goals  Acute Rehab OT Goals OT Goal Formulation: With patient Time For Goal Achievement: 09/19/23 Potential to Achieve Goals: Good ADL Goals Pt Will Perform Grooming: Independently;standing Pt Will Perform Lower Body Dressing: Independently;sit to/from stand;with adaptive equipment Pt Will Transfer to Toilet: Independently;ambulating;regular height toilet  Plan      Co-evaluation                 AM-PAC OT 6 Clicks Daily Activity     Outcome Measure   Help from another person eating meals?: None Help from another person taking care of personal grooming?: A Little Help from another person toileting, which includes using toliet, bedpan, or urinal?: A Little Help from another person bathing (including washing, rinsing, drying)?: A Little Help from another person to put on and taking off regular upper body clothing?: A Little Help from another person to  put on and taking off regular lower body clothing?: A Lot 6 Click Score: 18    End of Session Equipment Utilized During Treatment: Rolling walker (2 wheels)  OT Visit Diagnosis: Unsteadiness on feet (R26.81);Muscle weakness (generalized) (M62.81)   Activity Tolerance Patient tolerated treatment well   Patient Left in chair;with call bell/phone within reach;with family/visitor present (PT in room)   Nurse Communication          Time: 8646-8591 OT Time Calculation (min): 15 min  Charges: OT General Charges $OT Visit: 1 Visit OT Treatments $Self Care/Home Management : 8-22 mins  Courtney Wilcox, M.S. OTR/L  09/06/23, 2:55 PM  ascom [phone]

## 2023-09-06 NOTE — Progress Notes (Signed)
 PT Cancellation Note  Patient Details Name: Courtney Wilcox MRN: [id number] DOB: 11-25-1945   Cancelled Treatment:    Reason Eval/Treat Not Completed: Fatigue/lethargy limiting ability to participate Patient states she just got a bath and back into bed, would like for me to come back if I can. Will re-attempt later.   Zavian Slowey 09/06/2023, 10:42 AM

## 2023-09-06 NOTE — Progress Notes (Signed)
 CC: Incarcerated recurrent ventral hernia Subjective: Patient reports doing well this morning. Still with some soreness. Has been up and walking around. Small BM yesterday and having flatus. TolerateD CLD  Objective: Vital signs in last 24 hours: Temp:  [97.7 F (36.5 C)-98.6 F (37 C)] 97.9 F (36.6 C) (08/21 0855) Pulse Rate:  [56-66] 56 (08/21 0855) Resp:  [16-18] 18 (08/21 0855) BP: (127-153)/(56-76) 153/63 (08/21 0855) SpO2:  [95 %-99 %] 97 % (08/21 0855) Last BM Date : 09/05/23  Intake/Output from previous day: 08/20 0701 - 08/21 0700 In: 0  Out: 100 [Urine:100] Intake/Output this shift: Total I/O In: 480 [P.O.:480] Out: -   Physical exam: Alert and oriented x 3, work of breathing on 2 L nasal cannula, abdomen is soft, obese, tender to palpation in the right lower abdomen and over the incision.  At the inferior part of the incision there is bruising to area, staples intact  Lab Results: CBC  Recent Labs    09/05/23 0503 09/06/23 0434  WBC 11.0* 11.9*  HGB 11.1* 10.3*  HCT 34.3* 32.3*  PLT 184 191   BMET Recent Labs    09/05/23 0503 09/05/23 1313 09/05/23 1735 09/06/23 0434  NA 137  --   --  138  K 5.4*   < > 4.8 4.7  CL 102  --   --  102  CO2 27  --   --  27  GLUCOSE 140*  --   --  107*  BUN 19  --   --  20  CREATININE 1.27*  --   --  1.40*  CALCIUM  8.9  --   --  9.1   < > = values in this interval not displayed.   PT/INR No results for input(s): LABPROT, INR in the last 72 hours. ABG No results for input(s): PHART, HCO3 in the last 72 hours.  Invalid input(s): PCO2, PO2  Studies/Results: DG OR LOCAL ABDOMEN Result Date: 09/04/2023 CLINICAL DATA:  [id number] At risk for retained foreign body [id number] EXAM: OR LOCAL ABDOMEN COMPARISON:  09/04/2023 FINDINGS: Two supine frontal views of the abdomen and pelvis are obtained, excluding portions of the left flank by collimation. Right hip arthroplasty is again identified. Excreted contrast is  seen within the bladder, which is decompressed with an indwelling Foley catheter. There are no unexpected radiopaque foreign bodies identified. Distended ascending and proximal transverse colon are again noted as on recent CT. No small bowel dilatation. No acute bony abnormalities. IMPRESSION: 1. Foley catheter within the urinary bladder. 2. No unexpected radiopaque foreign body. Electronically Signed   By: Ozell Daring M.D.   On: 09/04/2023 17:52   CT ABDOMEN PELVIS W CONTRAST Result Date: 09/04/2023 CLINICAL DATA:  Severe abdominal pain for 6 weeks. Umbilical hernia. Constipation. EXAM: CT ABDOMEN AND PELVIS WITH CONTRAST TECHNIQUE: Multidetector CT imaging of the abdomen and pelvis was performed using the standard protocol following bolus administration of intravenous contrast. RADIATION DOSE REDUCTION: This exam was performed according to the departmental dose-optimization program which includes automated exposure control, adjustment of the mA and/or kV according to patient size and/or use of iterative reconstruction technique. CONTRAST:  80mL OMNIPAQUE  IOHEXOL  300 MG/ML  SOLN COMPARISON:  08/10/2023 FINDINGS: Lower Chest: No acute findings. Hepatobiliary: No suspicious hepatic masses identified. Gallbladder is unremarkable. No evidence of biliary ductal dilatation. Pancreas:  No mass or inflammatory changes. Spleen: Within normal limits in size and appearance. Adrenals/Urinary Tract: No suspicious masses identified. No evidence of ureteral calculi or hydronephrosis. Stomach/Bowel: Small hiatal  hernia, without change. Sigmoid diverticulosis, without signs of diverticulitis. A moderate midline ventral hernia is seen containing a loop of transverse colon. This is causing marked dilatation of the proximal colon, consistent with colonic obstruction. No No evidence of small bowel dilatation. Normal appendix visualized. No evidence of free air or abscess. Vascular/Lymphatic: No pathologically enlarged lymph  nodes. No acute vascular findings. 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 Reproductive:  No mass or other significant abnormality. Other:  None. Musculoskeletal:  Right hip prosthesis noted. IMPRESSION: Moderate midline ventral hernia containing a loop of transverse colon, causing colonic obstruction. Sigmoid diverticulosis, without radiographic evidence of diverticulitis. Small hiatal hernia. Electronically Signed   By: Norleen DELENA Kil M.D.   On: 09/04/2023 12:46    Anti-infectives: Anti-infectives (From admission, onward)    Start     Dose/Rate Route Frequency Ordered Stop   09/05/23 0600  cefoTEtan  (CEFOTAN ) 2 g in sodium chloride  0.9 % 100 mL IVPB        2 g 200 mL/hr over 30 Minutes Intravenous On call to O.R. 09/04/23 1457 09/05/23 0204   09/04/23 2200  ceFAZolin  (ANCEF ) IVPB 2g/100 mL premix        2 g 200 mL/hr over 30 Minutes Intravenous Every 8 hours 09/04/23 1926 09/05/23 1405       Assessment/Plan:  Patient status post open incarcerated, recurrent ventral hernia repair on 19 August. Overall doing well this morning. Now having bowel function  N: Scheduled Tylenol , as needed narcotics. Home Zoloft .  P: Incentive Spirometer, home inhalers C: Home diltiazem  for SVT. Hold plavix  for now.  GI: Okay for FLD diet now, bowel regimen. Possible advancement of diet this evening  RFEN:  home sodium bicarb tablets.  H/H: Hold plavix  for now ID: Peri-operative ancef  for 3 doses. Completed MSK/Skin: Keep dressingon wound today. PT/OT consult, OOB and ambulate  Okay to restart Chemical DVT ppx. Hospitalist consult given medical comorbidities.   Jayson Endow, M.D. [facility]

## 2023-09-06 NOTE — Care Management Important Message (Signed)
 Important Message  Patient Details  Name: Courtney Wilcox MRN: [id number] Date of Birth: Dec 19, 1945   Important Message Given:  Yes - Medicare IM     Rojelio SHAUNNA Rattler 09/06/2023, 12:47 PM

## 2023-09-07 ENCOUNTER — Other Ambulatory Visit: Payer: Self-pay

## 2023-09-07 ENCOUNTER — Other Ambulatory Visit: Payer: Self-pay | Admitting: Cardiology

## 2023-09-07 ENCOUNTER — Encounter: Payer: Self-pay | Admitting: Oncology

## 2023-09-07 DIAGNOSIS — N1832 Chronic kidney disease, stage 3b: Secondary | ICD-10-CM | POA: Diagnosis not present

## 2023-09-07 DIAGNOSIS — K436 Other and unspecified ventral hernia with obstruction, without gangrene: Secondary | ICD-10-CM | POA: Diagnosis not present

## 2023-09-07 DIAGNOSIS — E875 Hyperkalemia: Secondary | ICD-10-CM | POA: Diagnosis not present

## 2023-09-07 MED ORDER — DILTIAZEM HCL ER COATED BEADS 180 MG PO CP24
180.0000 mg | ORAL_CAPSULE | Freq: Every day | ORAL | Status: DC
  Administered 2023-09-07: 180 mg via ORAL
  Filled 2023-09-07: qty 1

## 2023-09-07 MED ORDER — OXYCODONE HCL 5 MG PO TABS
5.0000 mg | ORAL_TABLET | Freq: Four times a day (QID) | ORAL | 0 refills | Status: DC | PRN
  Filled 2023-09-07: qty 20, 5d supply, fill #0

## 2023-09-07 NOTE — Discharge Instructions (Signed)
 In addition to included general post-operative instructions,  Diet: Resume home diet.   Activity: No heavy lifting >20 pounds (children, pets, laundry, garbage) or strenuous activity for 6 weeks, but light activity and walking are encouraged. Do not drive or drink alcohol if taking narcotic pain medications or having pain that might distract from driving. Okay to work with therapies if needed. Utilize abdominal binder daily for 4 weeks. Okay to remove to shower.   Wound care: You may shower/get incision wet with soapy water and pat dry (do not rub incisions), but no baths or submerging incision underwater until follow-up. We will remove staples at first follow up appointment.   Medications: Resume all home medications. For mild to moderate pain: acetaminophen  (Tylenol ) or ibuprofen/naproxen (if no kidney disease). Combining Tylenol  with alcohol can substantially increase your risk of causing liver disease. Narcotic pain medications, if prescribed, can be used for severe pain, though may cause nausea, constipation, and drowsiness. Do not combine Tylenol  and Percocet (or similar) within a 6 hour period as Percocet (and similar) contain(s) Tylenol . If you do not need the narcotic pain medication, you do not need to fill the prescription.  Call office [phone] / [phone]) at any time if any questions, worsening pain, fevers/chills, bleeding, drainage from incision site, or other concerns.

## 2023-09-07 NOTE — Discharge Summary (Signed)
 [facility] SURGICAL DISCHARGE SUMMARY (cpt: [phone])  Patient ID: Courtney Wilcox MRN: [id number] DOB/AGE: 76-Jun-1947 76 y.o.  Admit date: 09/04/2023 Discharge date: 09/07/2023  Discharge Diagnoses Patient Active Problem List   Diagnosis Date Noted   OSA (obstructive sleep apnea) 09/06/2023   Hyperkalemia 09/06/2023   Incarcerated ventral hernia 09/04/2023   Acute respiratory failure with hypoxia (HCC) 04/25/2023   NSTEMI (non-ST elevated myocardial infarction) (HCC) 04/25/2023   SIRS (systemic inflammatory response syndrome) (HCC) 04/25/2023   Respiratory infection 04/25/2023   Sepsis (HCC) 04/25/2023   Coronary artery disease involving native coronary artery of native heart 09/30/2022   Class 1 obesity due to excess calories with body mass index (BMI) of 34.0 to 34.9 in adult 09/28/2022   Unstable angina (HCC) 09/28/2022   Coronary artery disease due to calcified coronary lesion 09/28/2022   CKD stage 3b, GFR 30-44 ml/min (HCC) 09/28/2022   GERD without esophagitis 09/28/2022   History of pulmonary embolism 01/20/2022   Anemia in CKD (chronic kidney disease) 12/09/2021   LVH (left ventricular hypertrophy) due to hypertensive disease, without heart failure 09/15/2021   SVT (supraventricular tachycardia) (HCC) 11/19/2020   History of atrial flutter 11/19/2020   Chronic venous insufficiency 08/15/2020   Carotid stenosis 02/04/2020   Hyperlipidemia LDL goal <70 02/04/2020   Multiple thyroid  nodules 05/21/2018   Thrombocytopenia (HCC) 07/31/2017   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/2017   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   Primary osteoarthritis of right hip 12/13/2016   S/P total knee arthroplasty 07/12/2016   Anemia of chronic disease 10/30/2013   Anxiety 10/30/2013   Osteoarthritis 07/08/2013   Osteopenia 07/08/2013   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   Chest pain 02/19/2013   Rheumatoid arthritis (HCC)  10/30/2012   Shortness of breath 10/29/2012   Spinal stenosis in cervical region 10/29/2012    Consultants Medicine  Procedures 09/04/2023:  Ventral Hernia Repair   HPI: The patient is a 78 year old female that presents in my office for follow-up of a ventral hernia. She was seen in the ED and at that time is reducible. When she last saw me it was also reducible and she was having just intermittent pain with it. At that time the decision was made to pursue cardiac restratification and pulmonary restratification prior to surgical repair of the hernia. She did get pulmonary restratification and they recommended no further workup. There was discussion about waiting until September for her ventral hernia repair from a cardiac standpoint and that she would be okay to hold Plavix  for 5 days but she did not get formal cardiac restratification. However, over the last 3 days she reports increased pain over the area. She says that she feels like the bulge is harder. She says that she has had difficulty eating given that she has cramping pain when she eats. She denies having any episodes of vomiting. She does report she has been passing flatus but she seems to have been more constipated over the last 3 days.   Hospital Course: Informed consent was obtained and documented, and patient underwent uneventful ventral hernia repair (Dr Marinda, 09/04/2023).  Post-operatively, patient did well considering the circumstances. Advancement of patient's diet and ambulation were well-tolerated. The remainder of patient's hospital course was essentially unremarkable, and discharge planning was initiated accordingly with patient safely able to be discharged home with appropriate discharge instructions, pain control, and outpatient follow-up after all of her questions were answered to her expressed satisfaction.   Discharge Condition: Good  Physical Examination:  Constitutional: alert, cooperative and no distress   Respiratory: breathing non-labored at rest  Cardiovascular: regular rate and sinus rhythm  Gastrointestinal: soft, incisional soreness, and non-distended, no rebound/guarding Integumentary: Laparotomy is CDI with staples, stable ecchymosis inferiorly   Allergies as of 09/07/2023       Reactions   Amiodarone  Other (See Comments)   Fatigue, Nausea, anorexia, malaise   Lactose Other (See Comments), Diarrhea   Other Other (See Comments)   Pt ONLY tolerates SLOW-FE (slow released iron )---upsets IBS   Biaxin [clarithromycin] Other (See Comments)   GI upset   Hydrocodone -acetaminophen  Itching   Tolerates acetaminophen     Oxycodone  Itching   Sulfa Antibiotics Other (See Comments), Diarrhea   GI upset GI upset GI upset   Sulfasalazine Diarrhea        Medication List     STOP taking these medications    ALPRAZolam  0.5 MG tablet Commonly known as: XANAX    methylPREDNISolone  4 MG Tbpk tablet Commonly known as: MEDROL  DOSEPAK   potassium chloride  SA 20 MEQ tablet Commonly known as: KLOR-CON  M   sodium bicarbonate  650 MG tablet       TAKE these medications    acetaminophen  500 MG tablet Commonly known as: TYLENOL  Take 1,000 mg by mouth 3 (three) times daily as needed for mild pain.   clopidogrel  75 MG tablet Commonly known as: PLAVIX  TAKE 1 TABLET BY MOUTH DAILY WITH BREAKFAST.   Cyanocobalamin 1000 MCG/ML Kit Inject as directed every 30 (thirty) days.   diltiazem  180 MG 24 hr capsule Commonly known as: CARDIZEM  CD Take 1 capsule (180 mg total) by mouth in the morning and at bedtime.   Dupilumab 300 MG/2ML Soaj Inject 4 mLs into the skin every 14 (fourteen) days.   EPINEPHrine  0.3 mg/0.3 mL Soaj injection Commonly known as: EPI-PEN Inject 0.3 mg into the muscle as needed.   famotidine  20 MG tablet Commonly known as: PEPCID  Take 20 mg by mouth daily as needed.   Fluticasone -Umeclidin-Vilant 100-62.5-25 MCG/ACT Aepb Inhale into the lungs. Inhale 1 Puff  into the lungs once daily   gabapentin  300 MG capsule Commonly known as: NEURONTIN  Take 300 mg by mouth at bedtime.   leflunomide  10 MG tablet Commonly known as: ARAVA  Take 10 mg by mouth daily.   levalbuterol  0.63 MG/3ML nebulizer solution Commonly known as: XOPENEX  Take 0.63 mg by nebulization every 6 (six) hours as needed for wheezing.   meclizine 12.5 MG tablet Commonly known as: ANTIVERT PLEASE SEE ATTACHED FOR DETAILED DIRECTIONS   montelukast  10 MG tablet Commonly known as: SINGULAIR  Take 10 mg by mouth daily as needed (for allergies.).   nystatin  100000 UNIT/ML suspension Commonly known as: MYCOSTATIN  Take 5 mLs by mouth as needed.   pantoprazole  40 MG tablet Commonly known as: PROTONIX  Take 1 tablet (40 mg total) by mouth 2 (two) times daily. What changed: when to take this   predniSONE  2.5 MG tablet Commonly known as: DELTASONE  Take 2 tablets (5 mg total) by mouth daily with breakfast. Resume after finishing high doses of prednisone    sertraline  25 MG tablet Commonly known as: ZOLOFT  Take 50 mg by mouth daily. 50 mg total   Simethicone  180 MG Caps Take 1-2 capsules by mouth as needed.   triamcinolone  55 MCG/ACT Aero nasal inhaler Commonly known as: NASACORT  Place 2 sprays into the nose daily.   Vitamin D  50 MCG (2000 UT) tablet Take 2,000 Units by mouth daily.          Follow-up  Information     Marinda Jayson KIDD, MD. Schedule an appointment as soon as possible for a visit in 10 day(s).   Specialty: General Surgery Why: s/p ventral hernia repair, for staple removal Contact information: [address] [place] [address] [phone]                  Time spent on discharge management including discussion of hospital course, clinical condition, outpatient instructions, prescriptions, and follow up with the patient and members of the medical team: >30 minutes  -- Arthea Platt , PA-C [facility]   09/07/2023, 9:56 AM [phone] M-F: 7am - 4pm

## 2023-09-07 NOTE — Progress Notes (Signed)
 Occupational Therapy Treatment Patient Details Name: Courtney Wilcox MRN: [id number] DOB: 15-Mar-1945 Today's Date: 09/07/2023   History of present illness 78 yo admitted to hospital for emergent ventral hernia repair on 09/04/23. PMH: CAD, CKD, PSVT   OT comments  Chart reviewed to date, pt greeted in bed, agreeable to OT tx session targeting improving functional activity tolerance in prep for ADL tasks. Pt is making progress towards goals, continues to endorse she feels weaker than baseline. Pt is  left in chair, all needs met. OT will continue to follow.       If plan is discharge home, recommend the following:  A little help with walking and/or transfers;A little help with bathing/dressing/bathroom;Help with stairs or ramp for entrance   Equipment Recommendations  BSC/3in1    Recommendations for Other Services      Precautions / Restrictions Precautions Precautions: Fall Recall of Precautions/Restrictions: Intact Restrictions Weight Bearing Restrictions Per Provider Order: No       Mobility Bed Mobility Overal bed mobility: Needs Assistance Bed Mobility: Supine to Sit     Supine to sit: Supervision          Transfers Overall transfer level: Needs assistance Equipment used: Rolling walker (2 wheels) Transfers: Sit to/from Stand Sit to Stand: Supervision, Contact guard assist                 Balance Overall balance assessment: Needs assistance Sitting-balance support: Feet supported Sitting balance-Leahy Scale: Good     Standing balance support: Bilateral upper extremity supported, During functional activity, Reliant on assistive device for balance Standing balance-Leahy Scale: Fair                             ADL either performed or assessed with clinical judgement   ADL Overall ADL's : Needs assistance/impaired     Grooming: Set up;Sitting               Lower Body Dressing: Supervision/safety;Sitting/lateral leans Lower Body  Dressing Details (indicate cue type and reason): donn socks via figure 4 position, intermittent vcs for technique; educated re: use of AE PRN Toilet Transfer: Supervision/safety;Contact guard assist;Rolling walker (2 wheels);Ambulation Toilet Transfer Details (indicate cue type and reason): simulated         Functional mobility during ADLs: Supervision/safety;Contact guard assist;Rolling walker (2 wheels) (approx 100')      Extremity/Trunk Assessment              Vision       Perception     Praxis     Communication Communication Communication: Impaired Factors Affecting Communication: Hearing impaired   Cognition Arousal: Alert Behavior During Therapy: WFL for tasks assessed/performed Cognition: No apparent impairments                               Following commands: Intact        Cueing   Cueing Techniques: Verbal cues  Exercises Other Exercises Other Exercises: edu re: role of OT, role of rehab, frequent attempts at mobility after discharge, use of bsc next to bed at night due to MD/decreased mobility    Shoulder Instructions       General Comments spo2 >90% on RA throughout, HR 80s bpm after mobility    Pertinent Vitals/ Pain       Pain Assessment Pain Assessment: No/denies pain  Home Living  Prior Functioning/Environment              Frequency  Min 3X/week        Progress Toward Goals  OT Goals(current goals can now be found in the care plan section)  Progress towards OT goals: Progressing toward goals  Acute Rehab OT Goals Time For Goal Achievement: 09/19/23  Plan      Co-evaluation                 AM-PAC OT 6 Clicks Daily Activity     Outcome Measure   Help from another person eating meals?: None Help from another person taking care of personal grooming?: A Little Help from another person toileting, which includes using toliet, bedpan, or  urinal?: A Little Help from another person bathing (including washing, rinsing, drying)?: A Little Help from another person to put on and taking off regular upper body clothing?: A Little Help from another person to put on and taking off regular lower body clothing?: A Lot 6 Click Score: 18    End of Session Equipment Utilized During Treatment: Rolling walker (2 wheels)  OT Visit Diagnosis: Unsteadiness on feet (R26.81);Muscle weakness (generalized) (M62.81)   Activity Tolerance Patient tolerated treatment well   Patient Left in chair;with call bell/phone within reach;with chair alarm set   Nurse Communication Mobility status        Time: 1009-1020 OT Time Calculation (min): 11 min  Charges: OT General Charges $OT Visit: 1 Visit OT Treatments $Therapeutic Activity: 8-22 mins  Therisa Sheffield, OTD OTR/L  09/07/23, 10:37 AM

## 2023-09-07 NOTE — Progress Notes (Signed)
  Progress Note   Patient: Courtney Wilcox FMW:[id number] DOB: 11/18/1945 DOA: 09/04/2023     3 DOS: the patient was seen and examined on 09/07/2023   Brief hospital course: Courtney Wilcox is a 78 y.o. female with past medical history of chronic diastolic congestive heart failure, chronic kidney disease stage IIIb, obstructive sleep apnea, COPD/asthma, coronary artery disease, gastroesophageal reflux disease, dyslipidemia, who presents to the hospital for elective ventral hernia repair was performed on 8/19.   Principal Problem:   Incarcerated ventral hernia Active Problems:   Anemia of chronic disease   Class 1 obesity due to excess calories with body mass index (BMI) of 34.0 to 34.9 in adult   Coronary artery disease due to calcified coronary lesion   CKD stage 3b, GFR 30-44 ml/min (HCC)   OSA (obstructive sleep apnea)   Hyperkalemia   Assessment and Plan: Asthma/COPD. Condition appear to be stable, patient does not have any shortness of breath.  Medically stable for discharge, no need to change treatment.   Chronic kidney disease stage IIIb. Hyperkalemia. Patient was taking potassium supplement before admission, which has been discontinued.  Calcium  had normalized after given a dose of Veltassa . Renal function still stable. I have discontinued potassium supplement from discharge medications   Chronic diastolic congestive heart failure. Condition stable.  History of SVT Patient was on diltiazem  prescribed by the electrophysiologist for a long time, will restart.  Class I obesity with BMI 32.62. Obstructive sleep apnea. Diet exercise advised   Post ventral hernia repair. Had a bowel movement yesterday, diet advanced to full liquid.       Subjective:  Patient doing well, no complaint  Physical Exam: Vitals:   09/06/23 1954 09/07/23 0429 09/07/23 0745 09/07/23 1000  BP: (!) 114/54 128/80 (!) 140/68 (!) 140/68  Pulse: 75 65 68   Resp: 16 16 16    Temp: 97.9 F  (36.6 C) 97.9 F (36.6 C) 98.1 F (36.7 C)   TempSrc: Oral Oral    SpO2: 99% 100% 95%   Weight:      Height:       General exam: Appears calm and comfortable  Respiratory system: Decreased breath sounds. Respiratory effort normal. Cardiovascular system: S1 & S2 heard, RRR. No JVD, murmurs, rubs, gallops or clicks. No pedal edema. Gastrointestinal system: Abdomen is nondistended, soft and nontender. No organomegaly or masses felt. Normal bowel sounds heard. Central nervous system: Alert and oriented. No focal neurological deficits. Extremities: Symmetric 5 x 5 power. Skin: No rashes, lesions or ulcers Psychiatry: Judgement and insight appear normal. Mood & affect appropriate.    Data Reviewed:  There are no new results to review at this time.  Family Communication: None  Disposition: Status is: Inpatient      Time spent: 35 minutes  Author: Murvin Mana, MD 09/07/2023 10:21 AM  For on call review [email or web address].

## 2023-09-07 NOTE — Plan of Care (Signed)

## 2023-09-07 NOTE — Progress Notes (Deleted)
 [facility] SUR[facility]ICAL PROGRESS NOTE  Hospital Day(s): 3.   Post op day(s): 3 Days Post-Op.   Interval History:  Patient seen and examined No acute events or new complaints overnight.  Patient reports she is doing well She is sore expectedly No fever, chills, nausea, emesis No new labs Diet advanced; having bowel function   Vital signs in last 24 hours: [min-max] current  Temp:  [97.7 F (36.5 C)-97.9 F (36.6 C)] 97.9 F (36.6 C) (08/22 0429) Pulse Rate:  [56-75] 65 (08/22 0429) Resp:  [16-18] 16 (08/22 0429) BP: (114-153)/(54-80) 128/80 (08/22 0429) SpO2:  [97 %-100 %] 100 % (08/22 0429)     Height: 5' 4 (162.6 cm) Weight: 86.2 kg BMI (Calculated): 32.6   Intake/Output last 2 shifts:  08/21 0701 - 08/22 0700 In: 780 [P.O.:780] Out: -    Physical Exam:  Constitutional: alert, cooperative and no distress  Respiratory: breathing non-labored at rest  Cardiovascular: regular rate and sinus rhythm  Gastrointestinal: soft, incisional soreness, and non-distended, no rebound/guarding Integumentary: Laparotomy is CDI with staples, stable ecchymosis inferiorly  Labs:     Latest Ref Rng & Units 09/06/2023    4:34 AM 09/05/2023    5:03 AM 08/10/2023    8:56 PM  CBC  WBC 4.0 - 10.5 K/uL 11.9  11.0  6.4   Hemoglobin 12.0 - 15.0 g/dL 89.6  88.8  87.9   Hematocrit 36.0 - 46.0 % 32.3  34.3  36.0   Platelets 150 - 400 K/uL 191  184  204       Latest Ref Rng & Units 09/06/2023    4:34 AM 09/05/2023    5:35 PM 09/05/2023    1:13 PM  CMP  Glucose 70 - 99 mg/dL 892     BUN 8 - 23 mg/dL 20     Creatinine 9.55 - 1.00 mg/dL 8.59     Sodium 864 - 854 mmol/L 138     Potassium 3.5 - 5.1 mmol/L 4.7  4.8  5.6   Chloride 98 - 111 mmol/L 102     CO2 22 - 32 mmol/L 27     Calcium  8.9 - 10.3 mg/dL 9.1        Imaging studies: No new pertinent imaging studies   Assessment/Plan:  78 y.o. female 3 Days Post-Op s/p ventral hernia repair   - Okay to continue diet as  tolerated - Will add abdominal binder for support - Monitor abdominal examination; on-going bowel function   - Pain control prn; antiemetics prn   - Mobilize as tolerated; reviewed restrictions - Further management per primary service     - Discharge Planning: Okay for discharge from surgical perspective. We will follow up in 7-10 days for staple removal   All of the above findings and recommendations were discussed with the patient, and the medical team, and all of patient's questions were answered to her expressed satisfaction.  -- Arthea Platt, PA-C [facility] 09/07/2023, 7:38 AM M-F: 7am - 4pm \

## 2023-09-07 NOTE — Progress Notes (Signed)
 Pt discharged to home with daughter via wheelchair. Discharge instructions reviewed with patient and family, all questions and concerns answered.

## 2023-09-16 NOTE — Anesthesia Postprocedure Evaluation (Signed)
 Anesthesia Post Note  Patient: Courtney Wilcox  Procedure(s) Performed: REPAIR, HERNIA, VENTRAL (Abdomen) INSERTION OF MESH  Patient location during evaluation: PACU Anesthesia Type: General Level of consciousness: awake and alert Pain management: pain level controlled Vital Signs Assessment: post-procedure vital signs reviewed and stable Respiratory status: spontaneous breathing, nonlabored ventilation, respiratory function stable and patient connected to nasal cannula oxygen Cardiovascular status: blood pressure returned to baseline and stable Postop Assessment: no apparent nausea or vomiting Anesthetic complications: no   No notable events documented.   Last Vitals:  Vitals:   09/07/23 0745 09/07/23 1000  BP: (!) 140/68 (!) 140/68  Pulse: 68   Resp: 16   Temp: 36.7 C   SpO2: 95%     Last Pain:  Vitals:   09/07/23 0800  TempSrc:   PainSc: 0-No pain                 Prentice Murphy

## 2023-09-18 ENCOUNTER — Ambulatory Visit (INDEPENDENT_AMBULATORY_CARE_PROVIDER_SITE_OTHER): Admitting: General Surgery

## 2023-09-18 ENCOUNTER — Encounter: Payer: Self-pay | Admitting: General Surgery

## 2023-09-18 VITALS — BP 136/88 | HR 86 | Temp 97.8°F | Ht 64.0 in | Wt 188.0 lb

## 2023-09-18 DIAGNOSIS — Z09 Encounter for follow-up examination after completed treatment for conditions other than malignant neoplasm: Secondary | ICD-10-CM

## 2023-09-18 DIAGNOSIS — K436 Other and unspecified ventral hernia with obstruction, without gangrene: Secondary | ICD-10-CM | POA: Diagnosis not present

## 2023-09-18 NOTE — Progress Notes (Signed)
 Outpatient Surgical Follow Up  09/18/2023  RONDALYN BELFORD is an 78 y.o. female.   Chief Complaint  Patient presents with   Routine Post Op    Hernia     HPI: The patient returns today status post repair of incarcerated ventral hernia.  She reports doing well.  She is moving around and doing all of her activities of daily living.  She is tolerating a regular diet and is having soft, formed bowel movements.  She says the pain from her hernia is gone.  She does report that she has had some serosanguineous drainage develop over the last several days.  Past Medical History:  Diagnosis Date   Allergic rhinitis    Anxiety    Arthritis    Asthma    Atrophic vaginitis    B12 deficiency    Bronchitis, chronic (HCC)    CAD (coronary artery disease)    a. 03/2003 Cath: Nl cors; b. 09/2022 Staged PCI: LM nl, LAD 85p/63m (3.5x26 Onyx Frontier DES), RI small, mild dzs, LCX nl, OM2 mild dzs, RCA 30p.   Cervical disc disease    CKD (chronic kidney disease), stage III (HCC)    Collagen vascular disease (HCC)    RA   Cough - post-COVID    Depression    Diastolic dysfunction    a. 11/2020 Echo: EF 45-50%, GrI DD; b. 10/2021 Echo: EF 60-65%, GrII DD; c. 09/2022 Echo: EF 55-60%, no rwma, GrI DD, nl RV fxn, mildly dil LA. No significant valvular dzs.   Fibrocystic breast disease    Foot ulcer (HCC)    GERD (gastroesophageal reflux disease)    Hammer toe    Hyperlipidemia    IBS (irritable bowel syndrome)    Macrocytic anemia    Macular degeneration 2023   Obesity    OSA (obstructive sleep apnea)    Osteoarthrosis    Multi site   Osteopenia    PSVT (paroxysmal supraventricular tachycardia) (HCC)    a. 04/2022 Zio: Sinus rhythm @ 57 (42-101). Rare PACs/PVCs. 74290 SVT runs lasting up to 65m 12sec - max rate 222. 5 beats NSVT. Triggered events = sinus, SVT, PACs.   Rheumatoid arthritis (HCC)    Shingles    Thyroid  nodule    MULTIPLE   Tonsillitis    recurrent   Umbilical hernia     Past  Surgical History:  Procedure Laterality Date   BACK SURGERY  2011   LAMINECTOMY   BREAST BIOPSY Right    neg   CERVICAL FUSION  2011, 2012   X 2   COLONOSCOPY  2005, 2015   COLONOSCOPY WITH PROPOFOL  N/A 08/04/2020   Procedure: COLONOSCOPY WITH PROPOFOL ;  Surgeon: Dessa Reyes ORN, MD;  Location: [facility] ENDOSCOPY;  Service: Endoscopy;  Laterality: N/A;   CORONARY STENT INTERVENTION N/A 10/02/2022   Procedure: CORONARY STENT INTERVENTION;  Surgeon: Darron Deatrice LABOR, MD;  Location: [facility];  Service: Cardiovascular;  Laterality: N/A;   ESOPHAGOGASTRODUODENOSCOPY (EGD) WITH PROPOFOL  N/A 09/26/2021   Procedure: ESOPHAGOGASTRODUODENOSCOPY (EGD) WITH PROPOFOL ;  Surgeon: Onita Elspeth Sharper, DO;  Location: [facility];  Service: Gastroenterology;  Laterality: N/A;   ESOPHAGOGASTRODUODENOSCOPY ENDOSCOPY  2005, 2013, 2015   EYE SURGERY Bilateral    Cataract Extraction with IOL    HERNIA REPAIR     INSERTION OF MESH N/A 09/04/2023   Procedure: INSERTION OF MESH;  Surgeon: Marinda Jayson KIDD, MD;  Location: [facility] ORS;  Service: General;  Laterality: N/A;   JOINT REPLACEMENT  KNEE ARTHROPLASTY Left 07/12/2016   Procedure: COMPUTER ASSISTED TOTAL KNEE ARTHROPLASTY;  Surgeon: Mardee Lynwood SQUIBB, MD;  Location: [facility] ORS;  Service: Orthopedics;  Laterality: Left;   Laryngeal tear after intubation w/repair     LUMBAR DISC SURGERY  12/2009   REPLACEMENT TOTAL KNEE Right 06/2007   DR. HOOTEN, [facility]   REPLACEMENT UNICONDYLAR JOINT KNEE     DR. HOOTEN, [facility]   RIGHT/LEFT HEART CATH AND CORONARY ANGIOGRAPHY N/A 09/29/2022   Procedure: RIGHT/LEFT HEART CATH AND CORONARY ANGIOGRAPHY;  Surgeon: Darron Deatrice LABOR, MD;  Location: [facility];  Service: Cardiovascular;  Laterality: N/A;   SHOULDER ARTHROSCOPY WITH ROTATOR CUFF REPAIR Right    TONSILLECTOMY     TOTAL HIP ARTHROPLASTY Right 01/31/2017   Procedure: TOTAL HIP ARTHROPLASTY;  Surgeon: Mardee Lynwood SQUIBB, MD;  Location: [facility] OR[facility];   Service: Orthopedics;  Laterality: Right;   TUBAL LIGATION     UPPER GI ENDOSCOPY     VENTRAL HERNIA REPAIR N/A 09/04/2023   Procedure: REPAIR, HERNIA, VENTRAL;  Surgeon: Marinda Jayson KIDD, MD;  Location: [facility] OR[facility];  Service: General;  Laterality: N/A;    Family History  Problem Relation Age of Onset   Asthma Mother    Breast cancer Mother 11   Emphysema Father        smoked   Lung cancer Father        smoked   Coronary artery disease Father 73    Social History:  reports that she has never smoked. She has never used smokeless tobacco. She reports that she does not drink alcohol and does not use drugs.  Allergies:  Allergies  Allergen Reactions   Amiodarone  Other (See Comments)    Fatigue, Nausea, anorexia, malaise   Lactose Other (See Comments) and Diarrhea   Other Other (See Comments)    Pt ONLY tolerates SLOW-FE (slow released iron )---upsets IBS   Biaxin [Clarithromycin] Other (See Comments)    GI upset    Hydrocodone -Acetaminophen  Itching    Tolerates acetaminophen     Oxycodone  Itching   Sulfa Antibiotics Other (See Comments) and Diarrhea    GI upset  GI upset GI upset    Sulfasalazine Diarrhea    Medications reviewed.    ROS Full ROS performed and is otherwise negative other than what is stated in HPI   BP 136/88   Pulse 86   Temp 97.8 F (36.6 C) (Oral)   Ht 5' 4 (1.626 m)   Wt 188 lb (85.3 kg)   SpO2 97%   BMI 32.27 kg/m   Physical Exam Alert and oriented x 3, no work of breathing on room air, regular rate and rhythm, abdomen is soft, some tenderness right over the staples.  There is bruising throughout the abdomen that is much improved from when she was in the hospital.  The bruising is centered around the incision as well as her site of DVT prophylaxis injections.  The superior 3 staples were removed and there was drainage of serosanguineous fluid.  And probing the wound with a Q-tip there was intact fascia.  The inferior staples were left  in.    No results found for this or any previous visit (from the past 48 hours). No results found.  Assessment/Plan:  Patient status post incarcerated ventral hernia repair with mesh.  She is on Plavix  and likely had a subcutaneous hematoma as there was significant bruising.  The bruising has improved but there is drainage of serosanguineous fluid from the wound.  I did  open several staples at the top.  There are no signs of infection.  I think this is likely the hematoma liquefying and necessitating out.  I will leave the inferior staples in.  I instructed the patient and her daughter to place gauze and an ABD pad over the incision to help with some of the drainage.  We will plan to see them again in 2 weeks to see how the wound is healing.   Jayson Endow, M.D. [facility]

## 2023-09-18 NOTE — Patient Instructions (Addendum)
 Continue to change your dressing daily, you will see some drainage. Keep the area clean and dry. Watch for signs of infection. Please give our office if you have any questions or concerns   We will see you back in 2 weeks   GENERAL POST-OPERATIVE PATIENT INSTRUCTIONS   WOUND CARE INSTRUCTIONS:  Keep a dry clean dressing on the wound if there is drainage. The initial bandage may be removed after 24 hours.  Once the wound has quit draining you may leave it open to air.  If clothing rubs against the wound or causes irritation and the wound is not draining you may cover it with a dry dressing during the daytime.  Try to keep the wound dry and avoid ointments on the wound unless directed to do so.  If the wound becomes bright red and painful or starts to drain infected material that is not clear, please contact your physician immediately.  If the wound is mildly pink and has a thick firm ridge underneath it, this is normal, and is referred to as a healing ridge.  This will resolve over the next 4-6 weeks.  BATHING: You may shower if you have been informed of this by your surgeon. However, Please do not submerge in a tub, hot tub, or pool until incisions are completely sealed or have been told by your surgeon that you may do so.  DIET:  You may eat any foods that you can tolerate.  It is a good idea to eat a high fiber diet and take in plenty of fluids to prevent constipation.  If you do become constipated you may want to take a mild laxative or take ducolax tablets on a daily basis until your bowel habits are regular.  Constipation can be very uncomfortable, along with straining, after recent surgery.  ACTIVITY:  You are encouraged to cough and deep breath or use your incentive spirometer if you were given one, every 15-30 minutes when awake.  This will help prevent respiratory complications and low grade fevers post-operatively if you had a general anesthetic.  You may want to hug a pillow when coughing  and sneezing to add additional support to the surgical area, if you had abdominal or chest surgery, which will decrease pain during these times.  You are encouraged to walk and engage in light activity for the next two weeks.  You should not lift more than 20 pounds for 6 weeks total after surgery as it could put you at increased risk for complications.  Twenty pounds is roughly equivalent to a plastic bag of groceries. At that time- Listen to your body when lifting, if you have pain when lifting, stop and then try again in a few days. Soreness after doing exercises or activities of daily living is normal as you get back in to your normal routine.  MEDICATIONS:  Try to take narcotic medications and anti-inflammatory medications, such as tylenol , ibuprofen, naprosyn, etc., with food.  This will minimize stomach upset from the medication.  Should you develop nausea and vomiting from the pain medication, or develop a rash, please discontinue the medication and contact your physician.  You should not drive, make important decisions, or operate machinery when taking narcotic pain medication.  SUNBLOCK Use sun block to incision area over the next year if this area will be exposed to sun. This helps decrease scarring and will allow you avoid a permanent darkened area over your incision.  QUESTIONS:  Please feel free to call our office  if you have any questions, and we will be glad to assist you. [phone]

## 2023-09-21 ENCOUNTER — Ambulatory Visit: Admitting: Cardiology

## 2023-09-25 ENCOUNTER — Ambulatory Visit: Admitting: General Surgery

## 2023-09-27 ENCOUNTER — Ambulatory Visit (INDEPENDENT_AMBULATORY_CARE_PROVIDER_SITE_OTHER): Admitting: Surgery

## 2023-09-27 ENCOUNTER — Encounter: Payer: Self-pay | Admitting: Surgery

## 2023-09-27 VITALS — BP 122/82 | HR 139 | Temp 97.7°F | Ht 64.0 in | Wt 185.0 lb

## 2023-09-27 DIAGNOSIS — K436 Other and unspecified ventral hernia with obstruction, without gangrene: Secondary | ICD-10-CM | POA: Diagnosis not present

## 2023-09-27 DIAGNOSIS — T8189XA Other complications of procedures, not elsewhere classified, initial encounter: Secondary | ICD-10-CM

## 2023-09-27 DIAGNOSIS — Z09 Encounter for follow-up examination after completed treatment for conditions other than malignant neoplasm: Secondary | ICD-10-CM

## 2023-09-27 MED ORDER — CEPHALEXIN 500 MG PO CAPS: 500.0000 mg | ORAL_CAPSULE | Freq: Four times a day (QID) | ORAL | 0 refills | Status: AC

## 2023-09-27 NOTE — Progress Notes (Signed)
 Outpatient Surgical Follow Up  09/27/2023  Courtney Wilcox is an 78 y.o. female.   Concerns regarding her wound, with increased drainage somewhat malodorous now.  She denies fevers and chills.  She reports increased tenderness in the area with surrounding redness.   The patient returns today status post repair of incarcerated ventral hernia with Sub-lay Ventralex mesh.  She reports the above and presents with her daughter.  She is moving around and doing all of her activities of daily living.  She is tolerating a regular diet and is having soft, formed bowel movements.    Past Medical History:  Diagnosis Date   Allergic rhinitis    Anxiety    Arthritis    Asthma    Atrophic vaginitis    B12 deficiency    Bronchitis, chronic (HCC)    CAD (coronary artery disease)    a. 03/2003 Cath: Nl cors; b. 09/2022 Staged PCI: LM nl, LAD 85p/66m (3.5x26 Onyx Frontier DES), RI small, mild dzs, LCX nl, OM2 mild dzs, RCA 30p.   Cervical disc disease    CKD (chronic kidney disease), stage III (HCC)    Collagen vascular disease (HCC)    RA   Cough - post-COVID    Depression    Diastolic dysfunction    a. 11/2020 Echo: EF 45-50%, GrI DD; b. 10/2021 Echo: EF 60-65%, GrII DD; c. 09/2022 Echo: EF 55-60%, no rwma, GrI DD, nl RV fxn, mildly dil LA. No significant valvular dzs.   Fibrocystic breast disease    Foot ulcer (HCC)    GERD (gastroesophageal reflux disease)    Hammer toe    Hyperlipidemia    IBS (irritable bowel syndrome)    Macrocytic anemia    Macular degeneration 2023   Obesity    OSA (obstructive sleep apnea)    Osteoarthrosis    Multi site   Osteopenia    PSVT (paroxysmal supraventricular tachycardia) (HCC)    a. 04/2022 Zio: Sinus rhythm @ 57 (42-101). Rare PACs/PVCs. 74290 SVT runs lasting up to 50m 12sec - max rate 222. 5 beats NSVT. Triggered events = sinus, SVT, PACs.   Rheumatoid arthritis (HCC)    Shingles    Thyroid  nodule    MULTIPLE   Tonsillitis    recurrent    Umbilical hernia     Past Surgical History:  Procedure Laterality Date   BACK SURGERY  2011   LAMINECTOMY   BREAST BIOPSY Right    neg   CERVICAL FUSION  2011, 2012   X 2   COLONOSCOPY  2005, 2015   COLONOSCOPY WITH PROPOFOL  N/A 08/04/2020   Procedure: COLONOSCOPY WITH PROPOFOL ;  Surgeon: Dessa Reyes ORN, MD;  Location: [facility] ENDOSCOPY;  Service: Endoscopy;  Laterality: N/A;   CORONARY STENT INTERVENTION N/A 10/02/2022   Procedure: CORONARY STENT INTERVENTION;  Surgeon: Darron Deatrice LABOR, MD;  Location: [facility];  Service: Cardiovascular;  Laterality: N/A;   ESOPHAGOGASTRODUODENOSCOPY (EGD) WITH PROPOFOL  N/A 09/26/2021   Procedure: ESOPHAGOGASTRODUODENOSCOPY (EGD) WITH PROPOFOL ;  Surgeon: Onita Elspeth Sharper, DO;  Location[facility];  Service: Gastroenterology;  Laterality: N/A;   ESOPHAGOGASTRODUODENOSCOPY ENDOSCOPY  2005, 2013, 2015   EYE SURGERY Bilateral    Cataract Extraction with IOL    HERNIA REPAIR     INSERTION OF MESH N/A 09/04/2023   Procedure: INSERTION OF MESH;  Surgeon: Marinda Jayson KIDD, MD;  Location: [facility] ORS;  Service: General;  Laterality: N/A;   JOINT REPLACEMENT     KNEE ARTHROPLASTY Left 07/12/2016  Procedure: COMPUTER ASSISTED TOTAL KNEE ARTHROPLASTY;  Surgeon: Mardee Lynwood SQUIBB, MD;  Location: [facility] OR[facility];  Service: Orthopedics;  Laterality: Left;   Laryngeal tear after intubation w/repair     LUMBAR DISC SURGERY  12/2009   REPLACEMENT TOTAL KNEE Right 06/2007   DR. HOOTEN, [facility]   REPLACEMENT UNICONDYLAR JOINT KNEE     DR. HOOTEN, [facility]   RIGHT/LEFT HEART CATH AND CORONARY ANGIOGRAPHY N/A 09/29/2022   Procedure: RIGHT/LEFT HEART CATH AND CORONARY ANGIOGRAPHY;  Surgeon: Darron Deatrice LABOR, MD;  Location: [facility];  Service: Cardiovascular;  Laterality: N/A;   SHOULDER ARTHROSCOPY WITH ROTATOR CUFF REPAIR Right    TONSILLECTOMY     TOTAL HIP ARTHROPLASTY Right 01/31/2017   Procedure: TOTAL HIP ARTHROPLASTY;  Surgeon: Mardee Lynwood SQUIBB,  MD;  Location: [facility] OR[facility];  Service: Orthopedics;  Laterality: Right;   TUBAL LIGATION     UPPER GI ENDOSCOPY     VENTRAL HERNIA REPAIR N/A 09/04/2023   Procedure: REPAIR, HERNIA, VENTRAL;  Surgeon: Marinda Jayson KIDD, MD;  Location: [facility] OR[facility];  Service: General;  Laterality: N/A;    Family History  Problem Relation Age of Onset   Asthma Mother    Breast cancer Mother 69   Emphysema Father        smoked   Lung cancer Father        smoked   Coronary artery disease Father 9    Social History:  reports that she has never smoked. She has never used smokeless tobacco. She reports that she does not drink alcohol and does not use drugs.  Allergies:  Allergies  Allergen Reactions   Amiodarone  Other (See Comments)    Fatigue, Nausea, anorexia, malaise   Lactose Other (See Comments) and Diarrhea   Other Other (See Comments)    Pt ONLY tolerates SLOW-FE (slow released iron )---upsets IBS   Biaxin [Clarithromycin] Other (See Comments)    GI upset    Hydrocodone -Acetaminophen  Itching    Tolerates acetaminophen     Oxycodone  Itching   Sulfa Antibiotics Other (See Comments) and Diarrhea    GI upset  GI upset GI upset    Sulfasalazine Diarrhea    Medications reviewed.    ROS Full ROS performed and is otherwise negative other than what is stated in HPI   There were no vitals taken for this visit.  Physical Exam Alert and oriented x 3, no work of breathing on room air, regular rate and rhythm, abdomen is soft, there is some evidence of a rim of dermal necrosis along the line of the staples.   I took a Building services engineer for epic, lost it, and took a Building services engineer from the patient's daughter's phone:  The remaining staples were removed I cleansed the area with Betadine swabs, and penetrated some depth with a sterile Q-tip, I felt it was prudent to remove some of the Vicryl sutures to allow better drainage.  And probing the wound with a Q-tip there remains intact fascia.    We had an egress of more  serosanguineous fluid, I could visualize some residual old clot in the bottom of the wound.  I packed the wound with some half-inch iodoform packing strip after debriding the skin edge with a 15 blade. Dressing was then covered with 4 x 4 gauze and an ABD pad and secured.   No results found for this or any previous visit (from the past 48 hours). No results found.  Assessment/Plan:  Patient status post incarcerated ventral hernia repair with mesh.  She  is still on Plavix .  I instructed the patient and her daughter to shower with the wound uncovered, will replace the half-inch wide packing strip, and cover with gauze and an ABD pad over the incision.  We will plan to see them again next week to see how the wound is progressing. I did obtain a culture swab of the depth of the wound, we will follow-up culture and sensitivity.  In the interim due to the mesh, I believe we will go ahead and start Keflex  as she has tolerated this in the past and adjust it as needed based on results.   Honor Leghorn, M.D., [facility]  09/27/2023 ; 3:53 PM

## 2023-09-27 NOTE — Patient Instructions (Signed)
 Wound Packing Wound packing usually involves placing a moistened packing material into your wound and then covering it with an outer bandage (dressing). This helps support the healing of deep tissue and tissue under the skin. It also helps prevent bleeding, infection, and further injury. Wounds are packed until deep tissues heal. The time it takes for this to happen is different for everyone. Your health care provider will show you how to pack and dress your wound. Using gloves and a clean technique is important to avoid spreading germs into your wound. Supplies needed: Soap and water. Disposable gloves. Cleansing or wetting solution, such as saline, germ-free (sterile) water, or an antiseptic solution. Clean bowl. Clean packing material, such as gauze, gauze sponges, or rolled gauze. Clean paper towels. Outer dressing. This includes the cover dressing and tape, or a dressing with an adhesive border. Cotton-tipped swabs. Small plastic bag for trash. How to pack your wound Follow your health care provider's instructions on how often you need to change dressings and pack your wound. You will likely be asked to change your dressings 1 to 2 times a day. Preparing to change the wound packing If needed, take pain medicine 30 minutes before you pack your wound as told by your health care provider. Preparing the new packing material  Clean and disinfect your work surface or countertop. Set a plastic bag on or near your work surface. Wash your hands with soap and water for at least 20 seconds before you change the dressing. If soap and water are not available, use hand sanitizer. Put a clean paper towel on the counter. Put a clean bowl on the towel. Only touch the outside of the bowl when handling it. Pour the cleansing or wetting solution that your health care provider tells you to use into the bowl. Select and cut your packing material to fit the size of your wound. Avoid using multiple pieces of  packing material. Drop it into the bowl. Cut tape strips that you will use to seal the outer dressing, if needed. Put gauze pads for cleansing and cotton-tipped swabs on the clean paper towel. Removing the old packing material and dressing Put on a set of gloves. Gently remove the old dressing and packing material. Make sure to check how the drainage looks or if there is any odor. Clean or rinse (irrigate) the wound. Remove your gloves. Put the removed items, including gloves, into the plastic bag to throw away later. Wash your hands again with soap and water for at least 20 seconds. If soap and water are not available, use hand sanitizer. Applying the new packing material and dressing  Put on a new set of gloves. Squeeze the packing material in the bowl to release the extra liquid. The packing material should be moist, but not dripping wet. Gently place the packing material into the wound. Use a cotton-tipped swab to guide it into place, filling all of the space. Do not overpack the wound bed. Dry your gloved fingertips on the paper towel. Open up your outer dressing supplies and put them on a dry part of the paper towel. Keep them from getting wet. Place the outer dressing over the packed wound. Tape the edges of the outer dressing in place. Remove your gloves. Wash your hands again with soap and water for at least 20 seconds. If soap and water are not available, use hand sanitizer. Put the removed items, including gloves, into the plastic bag to throw away. Clean and disinfect your work  surface or countertop. General tips Follow your health care provider's instructions on how much to pack the wound. At first, you may need to pack it more fully to help stop bleeding. As the wound begins to heal inside, you will use less packing material and pack the wound loosely to allow the tissue to heal slowly from the inside out. Do not take baths, swim, or use a hot tub until your health care  provider approves. Ask your health care provider if you may take showers. You may only be allowed to take sponge baths. Keep the dressing clean and dry. Follow any other instructions given by your health care provider on how to aid healing. This may include applying warm or cold compresses, raising (elevating) the affected area, or wearing a compression dressing. Check your wound site every day for signs of infection. Check for: More redness, swelling, or pain. More fluid or blood. Warmth or hardness (induration). Pus or a bad smell. Protect your wound from the sun when you are outside for the first 6 months, or for as long as told by your health care provider. Cover up the scar area or apply sunscreen that has an SPF of at least 30. Keep all follow-up visits. This is important. Contact a health care provider if: Your pain is not controlled with pain medicine. You have more drainage, redness, swelling, or pain at your wound site. You have new rash, warmth, or induration around the wound. You have a fever or chills. Your wound becomes larger or deeper. Get help right away if: The tissue inside your wound changes color from pink to white, yellow, or black. You notice a bad smell or pus coming from the wound site. You are having trouble packing your wound. Your wound is bleeding, and the bleeding does not stop with gentle pressure. These symptoms may represent a serious problem that is an emergency. Do not wait to see if the symptoms will go away. Get medical help right away. Call your local emergency services (911 in the [place] Do not drive yourself to the hospital. Summary Wound packing usually involves placing a moistened packing material into your wound and then covering it with an outer bandage (dressing). Follow your health care provider's instructions on how often you need to change dressings and pack your wound. You will likely be asked to change dressings 1 to 2 times a day. When  packing your wound, it is important to use gloves to avoid spreading germs into the wound. Check your wound site every day for signs of infection. This information is not intended to replace advice given to you by your health care provider. Make sure you discuss any questions you have with your health care provider. Document Revised: 05/11/2020 Document Reviewed: 05/11/2020 Elsevier Patient Education  2024 [employer].

## 2023-10-04 ENCOUNTER — Ambulatory Visit: Admitting: General Surgery

## 2023-10-04 ENCOUNTER — Ambulatory Visit (INDEPENDENT_AMBULATORY_CARE_PROVIDER_SITE_OTHER): Admitting: General Surgery

## 2023-10-04 ENCOUNTER — Encounter: Payer: Self-pay | Admitting: General Surgery

## 2023-10-04 VITALS — BP 140/82 | HR 65 | Temp 98.0°F | Ht 64.0 in | Wt 188.0 lb

## 2023-10-04 DIAGNOSIS — B370 Candidal stomatitis: Secondary | ICD-10-CM | POA: Diagnosis not present

## 2023-10-04 DIAGNOSIS — B3781 Candidal esophagitis: Secondary | ICD-10-CM | POA: Diagnosis not present

## 2023-10-04 MED ORDER — NYSTATIN 100000 UNIT/ML MT SUSP: 5.0000 mL | Freq: Four times a day (QID) | OROMUCOSAL | 0 refills | Status: DC

## 2023-10-04 NOTE — Patient Instructions (Addendum)
 Continue to pack the wound daily and change the bandage. Keep it clean and dry. If you have any questions or concerns please don't hesitate to call the office.  Call your PCP if you continue to have some shortness of breath.    Wound Packing Wound packing usually involves placing a moistened packing material into your wound and then covering it with an outer bandage (dressing). This helps support the healing of deep tissue and tissue under the skin. It also helps prevent bleeding, infection, and further injury. Wounds are packed until deep tissues heal. The time it takes for this to happen is different for everyone. Your health care provider will show you how to pack and dress your wound. Using gloves and a clean technique is important to avoid spreading germs into your wound. Supplies needed: Soap and water. Disposable gloves. Cleansing or wetting solution, such as saline, germ-free (sterile) water, or an antiseptic solution. Clean bowl. Clean packing material, such as gauze, gauze sponges, or rolled gauze. Clean paper towels. Outer dressing. This includes the cover dressing and tape, or a dressing with an adhesive border. Cotton-tipped swabs. Small plastic bag for trash. How to pack your wound Follow your health care provider's instructions on how often you need to change dressings and pack your wound. You will likely be asked to change your dressings 1 to 2 times a day. Preparing to change the wound packing If needed, take pain medicine 30 minutes before you pack your wound as told by your health care provider. Preparing the new packing material  Clean and disinfect your work surface or countertop. Set a plastic bag on or near your work surface. Wash your hands with soap and water for at least 20 seconds before you change the dressing. If soap and water are not available, use hand sanitizer. Put a clean paper towel on the counter. Put a clean bowl on the towel. Only touch the outside  of the bowl when handling it. Pour the cleansing or wetting solution that your health care provider tells you to use into the bowl. Select and cut your packing material to fit the size of your wound. Avoid using multiple pieces of packing material. Drop it into the bowl. Cut tape strips that you will use to seal the outer dressing, if needed. Put gauze pads for cleansing and cotton-tipped swabs on the clean paper towel. Removing the old packing material and dressing Put on a set of gloves. Gently remove the old dressing and packing material. Make sure to check how the drainage looks or if there is any odor. Clean or rinse (irrigate) the wound. Remove your gloves. Put the removed items, including gloves, into the plastic bag to throw away later. Wash your hands again with soap and water for at least 20 seconds. If soap and water are not available, use hand sanitizer. Applying the new packing material and dressing  Put on a new set of gloves. Squeeze the packing material in the bowl to release the extra liquid. The packing material should be moist, but not dripping wet. Gently place the packing material into the wound. Use a cotton-tipped swab to guide it into place, filling all of the space. Do not overpack the wound bed. Dry your gloved fingertips on the paper towel. Open up your outer dressing supplies and put them on a dry part of the paper towel. Keep them from getting wet. Place the outer dressing over the packed wound. Tape the edges of the outer dressing in  place. Remove your gloves. Wash your hands again with soap and water for at least 20 seconds. If soap and water are not available, use hand sanitizer. Put the removed items, including gloves, into the plastic bag to throw away. Clean and disinfect your work surface or countertop. General tips Follow your health care provider's instructions on how much to pack the wound. At first, you may need to pack it more fully to help stop  bleeding. As the wound begins to heal inside, you will use less packing material and pack the wound loosely to allow the tissue to heal slowly from the inside out. Do not take baths, swim, or use a hot tub until your health care provider approves. Ask your health care provider if you may take showers. You may only be allowed to take sponge baths. Keep the dressing clean and dry. Follow any other instructions given by your health care provider on how to aid healing. This may include applying warm or cold compresses, raising (elevating) the affected area, or wearing a compression dressing. Check your wound site every day for signs of infection. Check for: More redness, swelling, or pain. More fluid or blood. Warmth or hardness (induration). Pus or a bad smell. Protect your wound from the sun when you are outside for the first 6 months, or for as long as told by your health care provider. Cover up the scar area or apply sunscreen that has an SPF of at least 30. Keep all follow-up visits. This is important. Contact a health care provider if: Your pain is not controlled with pain medicine. You have more drainage, redness, swelling, or pain at your wound site. You have new rash, warmth, or induration around the wound. You have a fever or chills. Your wound becomes larger or deeper. Get help right away if: The tissue inside your wound changes color from pink to white, yellow, or black. You notice a bad smell or pus coming from the wound site. You are having trouble packing your wound. Your wound is bleeding, and the bleeding does not stop with gentle pressure. These symptoms may represent a serious problem that is an emergency. Do not wait to see if the symptoms will go away. Get medical help right away. Call your local emergency services (911 in the [place] Do not drive yourself to the hospital. Summary Wound packing usually involves placing a moistened packing material into your wound and then  covering it with an outer bandage (dressing). Follow your health care provider's instructions on how often you need to change dressings and pack your wound. You will likely be asked to change dressings 1 to 2 times a day. When packing your wound, it is important to use gloves to avoid spreading germs into the wound. Check your wound site every day for signs of infection. This information is not intended to replace advice given to you by your health care provider. Make sure you discuss any questions you have with your health care provider. Document Revised: 05/11/2020 Document Reviewed: 05/11/2020 Elsevier Patient Education  2024 [employer].

## 2023-10-05 ENCOUNTER — Telehealth: Payer: Self-pay | Admitting: Cardiology

## 2023-10-05 NOTE — Telephone Encounter (Signed)
 Called and spoke with the patient and her daughter.  Daughter was concerned with the patient's heart rate being in the 60s because she is more prone to tachycardia and it is unusual for her heart rate to be in the 60s.  Daughter was concerned about continuing the diltiazem  as prescribed (180 mg BID) due to this.  And she attributes the shortness of breath more to the patient's lungs and asthma, stating that she normally has issues with asthma this time of the year and stating that they have an appointment with the patient's pulmonary doctor on Monday.  Patient is not feeling any other symptoms like lightheadedness, dizziness, or chest pain.  Patient advised to continue taking diltiazem  as directed, and keeping a closer monitoring of the patient's heart rate and blood pressure.  As well as taking the patient to the emergency room or calling 911 if any concerning symptoms develop like chest pain, lightheadedness, or dizziness.  Patient also advised to call after the Pulmonary appointment to see if we need to schedule the patient to be seen in the office earlier than the recall schedule of November and December.  Patient's daughter verbalized understanding and agreement with the monitoring plan as outlined.

## 2023-10-05 NOTE — Telephone Encounter (Signed)
 STAT if HR is under 50 or over 120 (normal HR is 60-100 beats per minute)  What is your heart rate? 65  Do you have a log of your heart rate readings (document readings)? 59, 67  Do you have any other symptoms? sob

## 2023-10-08 LAB — ANAEROBIC AND AEROBIC CULTURE

## 2023-10-08 NOTE — Progress Notes (Signed)
 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 She is accompanied by her caregiver, Mimi.  She describes episodes of choking especially when attempting to sleep.   She recently had a COVID-19 infection and continues to experience a cough, though it does not significantly impact her breathing. During the infection, she used levalbuterol  two to three times a day, which she believes helped her lungs.  She has been experiencing elevated liver enzymes, which she associates with her medication use. Atorvastatin  was stopped for three to four months and then restarted at 40 mg for about a week, after which her liver enzymes were found to be high again. She is concerned about the potential impact of Dupixent on her liver enzymes, as she has been receiving injections since her hospitalization in April.  Her current medications include levalbuterol , Dupixent, and Synalar. She has not been using Trelegy frequently due to its cost and manages her symptoms with over-the-counter allergy pills and Mucinex. She also mentions a past use of Zoloft .  She has a history of asthma and rheumatoid arthritis, which are managed with her current medication regimen. She experiences joint pains related to her rheumatoid arthritis. No COPD is reported.   Past Medical History:   Past Medical History:  Diagnosis Date  . Allergic state   . Anemia   . Anxiety   . Arthritis   . Asthma without status asthmaticus (HHS-HCC)   . Atrophic vaginitis   . Cervical disc disease    with spinal stenosis  . Chronic bronchitis (CMS/HHS-HCC)   . Depression   . Environmental allergies   . Fibrocystic disease of both breasts    . GERD (gastroesophageal reflux disease)   . H/O mammogram 12/17/2012  . IBS (irritable bowel syndrome)   . Lumbar disc disease   . Multiple thyroid  nodules    Stable on follow-up ultrasounds from 2015 - 2020. No routine follow-up needed.   . NSTEMI (non-ST elevated myocardial infarction) (CMS/HHS-HCC) 04/25/2023  . Obesity   . Osteoarthritis   . Osteopenia   . Recurrent tonsillitis   . Rheumatoid arthritis (CMS/HHS-HCC)   . Shingles   . Sleep apnea   . Ulcer    Foot    Past Surgical History:   Past Surgical History:  Procedure Laterality Date  . COLONOSCOPY  04/28/2003   Hyperplastic Polyp: CBF 07/2013  . Right total knee arthroplasty  07/09/2007   Dr. Mardee  . ANTERIOR FUSION CERVICAL SPINE  2011  . BACK SURGERY  2011   laminectomy  . Exterior cervical fusion  2012   cleaned out infection  . Right subacromial decompression and rotator cuff repair Right 05/28/2013  . COLONOSCOPY  12/18/2013   Int Hemorrhoids ([place]): CBF 12/2023  . EGD  12/18/2013  . Left total knee arthroplasty using computer-assisted navigation  07/12/2016   Dr Mardee  . Right total hip arthroplasty  01/31/2017   Dr Mardee  . ENDOSCOPIC  CARPAL TUNNEL RELEASE Left 07/09/2019   Dr. Kathlynn  . COLONOSCOPY  08/04/2020   Diverticulosis/Otherwise normal colon/No repeat due to age/JWB  . EGD @ [facility]  09/26/2021   Gastritis/Gastric polyp/No Repeat/SMR  . TRANSCATH PLACEMENT INTRAVASCULAR STENT ARM  10/02/2022  . REPAIR INCISIONAL/VENTRAL HERNIA  09/04/2023  . Breast biopsies    . CATARACT EXTRACTION    . EGD  09/13/2011, 07/28/2003  . HERNIA REPAIR    . TONSILLECTOMY    . TUBAL LIGATION      Allergies:   Allergies  Allergen Reactions  . Amiodarone  Other (See Comments)    Fatigue, Nausea, anorexia, malaise  amiodarone   . Clarithromycin Other (See Comments)    Other Reaction: acid reflux  clarithromycin  . Lactose Diarrhea and Abdominal Pain  . Norco [Hydrocodone -Acetaminophen ] Itching  .  Other Other (See Comments) and Diarrhea    Pt ONLY tolerates SLOW-FE (slow released iron )---upsets IBS  Other reaction(s): Other (See Comments)  GI upset    GI upset  GI upset  . Oxycontin  [Oxycodone ] Itching  . Sulfa (Sulfonamide Antibiotics) Diarrhea and Other (See Comments)    GI upset  . Sulfasalazine Diarrhea    Current Medications:   Prior to Admission medications  Medication Sig Taking? Last Dose  acetaminophen  (TYLENOL  EXTRA STRENGTH) 500 MG tablet 2 tab by mouth as needed as needed Yes Taking  aspirin  81 MG chewable tablet Take 81 mg by mouth once daily Yes Taking  budesonide -formoteroL  (SYMBICORT ) 160-4.5 mcg/actuation inhaler Inhale 2 inhalations into the lungs 2 (two) times daily Yes Taking  cephalexin  (KEFLEX ) 500 MG capsule Take 500 mg by mouth 4 (four) times daily Yes Taking  cholecalciferol  (VITAMIN D3) 2,000 unit tablet Take 2 tablets (4,000 Units total) by mouth once daily Yes Taking  clopidogreL  (PLAVIX ) 75 mg tablet Take 75 mg by mouth daily with breakfast Yes Taking  dilTIAZem  (CARDIZEM  CD) 180 MG CD capsule Take 180 mg by mouth 2 (two) times daily Yes Taking  dupilumab (DUPIXENT PEN) 300 mg/2 mL pen injector INJECT 300MG  (1 INJECTION) SUBCUTANEOUSLY EVERY OTHER WEEK. Yes Taking  EPINEPHrine  (EPIPEN ) 0.3 mg/0.3 mL auto-injector Inject 0.3 mg into the muscle once as needed Yes Taking  famotidine  (PEPCID ) 20 MG tablet Take 20 mg by mouth as needed for Heartburn Yes Taking  gabapentin  (NEURONTIN ) 100 MG capsule Take 1 capsule (100 mg total) by mouth at bedtime Yes Taking  guaiFENesin (HUMIBID 3) 400 mg Tab tablet Take 400 mg by mouth as needed for Cough Q 6-8 Hrs. PRN Yes Taking  inhalational spacer (AEROCHAMBER) spacer Use as instructed. Yes Taking  levalbuterol  (XOPENEX ) 0.63 mg/3 mL nebulizer solution Take 3 mLs (0.63 mg total) by nebulization every 6 (six) hours as needed for Wheezing Yes Taking  meclizine (ANTIVERT) 12.5 mg tablet Take 12.5 mg by mouth 3 (three)  times daily as needed for Dizziness Yes Taking  montelukast  (SINGULAIR ) 10 mg tablet Take 1 tablet (10 mg total) by mouth at bedtime Yes Taking  nystatin  (MYCOSTATIN ) 100,000 unit/mL suspension Take 500,000 Units by mouth 4 (four) times daily Yes Taking  ondansetron  (ZOFRAN -ODT) 4 MG disintegrating tablet Take 1 tablet (4 mg total) by mouth every 6 (six) hours as needed Yes Taking  pantoprazole  (PROTONIX ) 40 MG DR tablet Take 1 tablet by mouth 2 (two) times daily Yes Taking  predniSONE  (DELTASONE ) 5 MG tablet Take 1 tablet (5 mg total) by mouth once daily Yes Taking  sertraline  (ZOLOFT ) 50 MG tablet TAKE 1 TABLET BY MOUTH EVERY DAY Yes Taking  sodium chloride  3 % nebulizer solution Take 4 mLs by nebulization 3 (three) times daily Patient taking differently: Take 4 mLs by nebulization 3 (three) times daily as needed for Cough Yes Taking  ALPRAZolam  (XANAX ) 0.5 MG tablet Take 1 tablet (0.5 mg total) by mouth once daily as needed for Sleep or Anxiety Patient not taking: Reported on 10/08/2023  Not Taking  dilTIAZem  (CARDIZEM  CD) 240 MG CD capsule Take 240 mg by mouth once daily Patient not taking: Reported on 10/08/2023  Not Taking  leflunomide  (ARAVA ) 10 MG tablet Take 10 mg by mouth once daily Patient not taking: Reported on 10/08/2023  Not Taking  potassium chloride  (KLOR-CON  M20) 20 MEQ ER tablet TAKE 1 TABLET BY MOUTH EVERY DAY Patient not taking: Reported on 10/08/2023  Not Taking    Family History:   Family History  Problem Relation Name Age of Onset  . Breast cancer Mother    . High blood pressure (Hypertension) Mother    . Diabetes Mother    . Myocardial Infarction (Heart attack) Father    . Lung cancer Father    . Heart murmur Sister      Social History:   Social History   Socioeconomic History  . Marital status: Married    Spouse name: Arley  . Number of children: 3  . Years of education: 14  Occupational History  . Occupation: Retired - Engineer, materials  Tobacco Use   . Smoking status: Never  . Smokeless tobacco: Never  Vaping Use  . Vaping status: Never Used  Substance and Sexual Activity  . Alcohol use: No    Alcohol/week: 0.0 standard drinks of alcohol  . Drug use: No  . Sexual activity: Not Currently    Partners: Male    Birth control/protection: Surgical, Post-menopausal   Social Drivers of Health   Financial Resource Strain: Low Risk  (06/21/2023)   Overall Financial Resource Strain (CARDIA)   . Difficulty of Paying Living Expenses: Not hard at all  Food Insecurity: No Food Insecurity (09/04/2023)   Received from [facility]   Hunger Vital Sign   . Within the past 12 months, you worried that your food would run out before you got the money to buy more.: Never true   . Within the past 12 months, the food you bought just didn't last and you didn't have money to get more.: Never true  Transportation Needs: No Transportation Needs (09/04/2023)   Received from [facility] - Transportation   . In the past 12 months, has lack of transportation kept you from medical appointments or from getting medications?: No   . In the past 12 months, has lack of transportation kept you from meetings, work, or from getting things needed for daily living?: No  Social Connections: Moderately Isolated (09/04/2023)   Received from [facility]   Social Connection and Isolation Panel   . In a typical week, how many times do you talk on the phone with family, friends, or neighbors?: More than three times a week   . How often do you get together with friends or relatives?: More than three times a week   . How often do you attend church or religious services?: More than 4 times per year   . Do you belong to any clubs or organizations such as church groups, unions, fraternal or athletic groups, or school groups?: No   . How often do you attend meetings of the clubs or organizations you belong  to?: Never   . Are you married, widowed, divorced, separated, never  married, or living with a partner?: Widowed  Housing Stability: Low Risk  (06/21/2023)   Housing Stability Vital Sign   . Unable to Pay for Housing in the Last Year: No   . Number of Times Moved in the Last Year: 0   . Homeless in the Last Year: No    Review of Systems:   A 10 point review of systems is negative, except for the pertinent positives and negatives detailed in the HPI.  Vitals:   Vitals:   10/08/23 1527  BP: 124/64  BP Location: Right forearm  Patient Position: Sitting  BP Cuff Size: Small Adult  Pulse: 71  SpO2: 98%  Weight: 87.2 kg (192 lb 3.2 oz)  Height: 154.9 cm (5' 0.98)     Body mass index is 36.34 kg/m.  Physical Exam:  Physical Exam Vitals and nursing note reviewed.  Constitutional:      General: She is not in acute distress.    Appearance: Normal appearance. She is not ill-appearing, toxic-appearing or diaphoretic.  HENT:     Head: Normocephalic and atraumatic.     Right Ear: External ear normal.     Left Ear: External ear normal.  Eyes:     General:        Right eye: No discharge.        Left eye: No discharge.     Extraocular Movements: Extraocular movements intact.     Pupils: Pupils are equal, round, and reactive to light.  Cardiovascular:     Rate and Rhythm: Normal rate and regular rhythm.     Pulses: Normal pulses.     Heart sounds: Normal heart sounds. No murmur heard.    No friction rub. No gallop.  Abdominal:     General: Bowel sounds are normal.  Skin:    General: Skin is warm and dry.     Capillary Refill: Capillary refill takes less than 2 seconds.  Neurological:     Mental Status: She is alert.     Lab and Imaging Results:  Results     Assessment and Plan:   There are no diagnoses linked to this encounter.   Assessment & Plan Moderate persistent asthma with acute exacerbation Asthma is well-controlled despite recent COVID-19 infection. She used levalbuterol  nebulization 2-3 times daily during COVID-19, which  effectively managed symptoms. Trelegy Ellipta usage is infrequent due to cost and perceived lack of necessity. Dupilumab has been beneficial for asthma control. - Continue levalbuterol  nebulization as needed, up to several times a day. - Continue Dupilumab 600 mg subcutaneously every 14 days. - Hold off on Trelegy Ellipta due to cost and current asthma control.  COVID-19 infection Recent COVID-19 infection with residual cough but no significant impact on breathing. COVID-19 can affect liver enzymes, but this is not the primary concern at this time.  Elevated liver enzymes Elevated liver enzymes possibly related to atorvastatin  use. Atorvastatin  was previously stopped, leading to a decrease in liver enzymes, but levels have fluctuated. COVID-19 could also contribute to liver enzyme elevation. - Follow up next Tuesday to further evaluate liver enzyme elevation.  Rheumatoid arthritis with positive rheumatoid factor Dupilumab may cause joint swelling, but this is not a new symptom due to existing rheumatoid arthritis.     Encounter for high risk medication :    - Dupixent biologic injection               Adverse  effect screening :  No visual disturbances, denies injection site reaction, denies rash, denies NVD, no abd pain no jaw discomfort, no swelling of lips.    -patient has Epinephrine  pen   - patient is asked to let us  know if any adverse effects occur and please seek medical attention if she has anaphylaxis.  -Immunization is recommended however while on Dupixent patient should not have any live vaccinations.  Adverse Reactions The following adverse drug reactions and incidences are derived from product labeling unless otherwise specified. Reported adverse reactions are for adolescents and adults unless otherwise specified. >10%:  Hematologic & oncologic: Eosinophilia (>=500 cells/mcL: 42%; >=1,000 cells/mcL: 2% to 14%; >=5,000 cells/mcL: <3%)  Immunologic: Antibody development (1%  to 16%; neutralizing: 1% to 5%)  Infection: Viral infection (adults: 14%)  Local: Injection-site reaction (1% to 38%)  Respiratory: Upper respiratory tract infection (18%)  1% to 10%:  Gastrointestinal: Diarrhea (adults: 3% to 4%), gastritis (adults: 2%), toothache (adults: 1% to 2%)  Genitourinary: Urinary tract infection (adults: 3%)  Infection: Helminthiasis (children: 2%; including ascariasis, enterobiasis), herpes virus infection (<=4%; including herpes simplex infection, herpes zoster infection [including ophthalmic herpes zoster], oral herpes simplex infection)  Nervous system: Dizziness (adults: 3%), headache (adults: 8%), insomnia (adults: 1%)  Neuromuscular & skeletal: Arthralgia (2% to 3%), back pain (adults: 5%), myalgia (adults: 3%)  Ophthalmic: Conjunctivitis (adults: 1% to 10%) (table 1), eye pruritus (adults: 1%) (table 2)  Respiratory: Nasopharyngitis (adults: 5% to 8%), oropharyngeal pain (2%), rhinitis (adults: 3%)  <1%:  Cardiovascular: Thromboembolic complications (adults: acute myocardial infarction, cerebrovascular accident)  Gastrointestinal: Cholecystitis (severe; adults)  Hypersensitivity: Hypersensitivity reaction (including anaphylaxis, serum sickness, serum sickness-like reaction)  Ophthalmic: Dry eye syndrome (adults), keratitis (adults)  Postmarketing (any population):  Cardiovascular: Aortic aneurysm (age >=75 years) (Ref), eosinophilic granulomatosis with polyangiitis (Ref), vasculitis (including hypersensitivity angiitis) (Ref)  Dermatologic: Acne vulgaris (Ref), acute generalized exanthematous pustulosis (Ref), alopecia (areata) (Ref), burning sensation of skin (Ref), desquamation (Ref), discoloration of sweat (infectious pseudochromhidrosis) (Ref), erythema multiforme (Ref), erythema nodosum (Ref), erythema of skin, facial erythema (Ref), facial rash, papule of skin, pruritus (Ref), psoriasis (including nail psoriasis) (Ref), skin pain, skin  rash, Sweet syndrome (Ref), vitiligo (Ref)  Gastrointestinal: Pancreatitis (Ref)  Hematologic & oncologic: Hodgkin lymphoma (Ref), immune thrombocytopenia (Ref), T-cell lymphoma (including cutaneous) (Ref)  Hypersensitivity: Angioedema (Ref), facial edema  Immunologic: Sarcoidosis (sarcoid-like granulomatosis and [neuro]sarcoid-like reaction) (Ref)  Infection: Influenza (Ref)  Nervous system: Sleep disturbance (including nocturnal dyspnea) (Ref)  Neuromuscular & skeletal: Arthritis (seronegative) (Ref), arthropathy (enthesitis and enthesopathy) (Ref), joint swelling (Ref)  Ophthalmic: Blepharitis (Ref), blepharoconjunctivitis (Ref), conjunctival scarring (Ref), corneal perforation (Ref), corneal ulcer (Ref), eye irritation (Ref), iridocyclitis (Ref), keratoconjunctivitis (Ref), keratoconus (Ref), punctate keratitis (Ref)  Respiratory: Eosinophilic pneumonitis (Ref), epistaxis (Ref)  Miscellaneous: Granuloma (annulare) (Ref)  Contraindications Known hypersensitivity to ??pil?m?b or any component of the formulation Warnings/Precautions Concerns related to adverse effects:  . Arthralgia: Arthralgia, including gait disturbances or decreased mobility associated with joint symptoms, has been reported, sometimes requiring hospitalization. May occur within days to months following initiation of therapy and has resolved with or without therapy discontinuation; report new-onset or worsening joint symptoms to health care provider.  . Eosinophilia and vasculitis: In rare cases, patients may present with serious systemic eosinophilia, sometimes presenting with clinical features of eosinophilic pneumonia or vasculitis consistent with eosinophilic granulomatosis with polyangiitis, a condition which is often treated with systemic corticosteroid therapy. Monitor for eosinophilia, vasculitic rash, worsening pulmonary symptoms, cardiac complications, and/or neuropathy, especially upon reduction of oral  corticosteroids. A causal association between d??ilumab and these underlying conditions has not been established.  Disease-related concerns:  . Asthma: Discontinuation or adjustment of asthma medications in patients treated with ?u?ilumab should not be done without consulting health care provider.  SABRA Helminth infections: It is unknown if administration of ?u?ilumab will influence a patient's response against parasitic infections; patients with known helminth infections were not studied. Adverse reactions of helminth infections were reported in pediatric patients 47 to 76 years of age who participated in the pediatric asthma development program. Therefore, patients with preexisting helminth infections should undergo treatment of the infection prior to initiation of ???ilumab therapy. Patients who become infected during treatment and do not respond to anti-helminth therapy should discontinue ??pilum?b until the infection resolves.  Concurrent drug therapy issues:  . Corticosteroid therapy: Do not discontinue corticosteroids abruptly following initiation of ??pilum?b therapy. Reductions in corticosteroid dose should be gradual, if appropriate. Clinicians should note that a reduction in corticosteroid dose may be associated with withdrawal symptoms and/or unmask conditions previously suppressed by systemic corticosteroid therapy.  Dosage form specific issues:  . Polysorbate 80: Some dosage forms may contain polysorbate 80 (also known as Tweens). Hypersensitivity reactions, usually a delayed reaction, have been reported following exposure to pharmaceutical products containing polysorbate 80 in certain individuals Micky 2002; Lucente 2000; Ginny 1995). Thrombocytopenia, ascites, pulmonary deterioration, and renal and hepatic failure have been reported in premature neonates after receiving parenteral products containing polysorbate 80 (Alade 1986; CDC 1984). See manufacturer's labeling.  Other  warnings/precautions:  . Appropriate use: Asthma/Chronic obstructive pulmonary disease: Therapy has not been shown to alleviate acute asthma or chronic obstructive pulmonary disease exacerbations; do not use to treat acute bronchospasm or status asthmaticus.  SABRA Appropriate use: Atopic dermatitis: D??ilum?b may be used in combination with or without topical corticosteroids. Topical calcineurin inhibitors may be used but should be reserved for problem areas only (eg, face, neck intertriginous and genital areas).  . Immunogenicity: D?pilum?b antibodies, including neutralizing antibodies, may develop; may be associated with lower serum d??ilum?b concentrations.  . Vaccines: Patients should be up to date with all immunizations before initiating therapy. Avoid the use of live vaccines in patients treated with d??ilumab.   I spent a total of 46 minutes in both face-to-face and non-face-to-face activities, excluding procedures performed, for this visit on the date of this encounter.     This note has been created using dictation software tool and any typographical errors are purely unintentional.  Patient received an After Visit Summary

## 2023-10-09 ENCOUNTER — Other Ambulatory Visit: Payer: Self-pay | Admitting: General Surgery

## 2023-10-09 MED ORDER — CEPHALEXIN 750 MG PO CAPS: 750.0000 mg | ORAL_CAPSULE | Freq: Four times a day (QID) | ORAL | 0 refills | Status: AC

## 2023-10-09 NOTE — Progress Notes (Addendum)
 Outpatient Surgical Follow Up   Courtney Wilcox is an 78 y.o. female.   Chief Complaint  Patient presents with   Routine Post Op    HPI: The patient returns today status post ventral hernia repair.  She was seen by my partner last week and at that time she had some drainage from the wound.  This was opened and the wound edges were debrided and she was started on Keflex .  She reports that the drainage is improved and is not malodorous.  She denies any purulent drainage.  She says that the wound seems to be closing up.  She does report that she feels like she is developing thrush which she often does when she has antibiotics.  I am also concerned about her breathing.  She says that her asthma seems to be worsening over the last several days.  Her daughter reports that this happens with allergies in the fall.  She denies any chest pain but does say that she does feel subjectively short of breath.  Past Medical History:  Diagnosis Date   Allergic rhinitis    Anxiety    Arthritis    Asthma    Atrophic vaginitis    B12 deficiency    Bronchitis, chronic (HCC)    CAD (coronary artery disease)    a. 03/2003 Cath: Nl cors; b. 09/2022 Staged PCI: LM nl, LAD 85p/60m (3.5x26 Onyx Frontier DES), RI small, mild dzs, LCX nl, OM2 mild dzs, RCA 30p.   Cervical disc disease    CKD (chronic kidney disease), stage III (HCC)    Collagen vascular disease    RA   Cough - post-COVID    Depression    Diastolic dysfunction    a. 11/2020 Echo: EF 45-50%, GrI DD; b. 10/2021 Echo: EF 60-65%, GrII DD; c. 09/2022 Echo: EF 55-60%, no rwma, GrI DD, nl RV fxn, mildly dil LA. No significant valvular dzs.   Fibrocystic breast disease    Foot ulcer (HCC)    GERD (gastroesophageal reflux disease)    Hammer toe    Hyperlipidemia    IBS (irritable bowel syndrome)    Macrocytic anemia    Macular degeneration 2023   Obesity    OSA (obstructive sleep apnea)    Osteoarthrosis    Multi site   Osteopenia    PSVT  (paroxysmal supraventricular tachycardia)    a. 04/2022 [facility]: Sinus rhythm @ 57 (42-101). Rare PACs/PVCs. 74290 SVT runs lasting up to 66m 12sec - max rate 222. 5 beats NSVT. Triggered events = sinus, SVT, PACs.   Rheumatoid arthritis (HCC)    Shingles    Thyroid  nodule    MULTIPLE   Tonsillitis    recurrent   Umbilical hernia     Past Surgical History:  Procedure Laterality Date   BACK SURGERY  2011   LAMINECTOMY   BREAST BIOPSY Right    neg   CERVICAL FUSION  2011, 2012   X 2   COLONOSCOPY  2005, 2015   COLONOSCOPY WITH PROPOFOL  N/A 08/04/2020   Procedure: COLONOSCOPY WITH PROPOFOL ;  Surgeon: Dessa Reyes ORN, MD;  Location: [facility];  Service: Endoscopy;  Laterality: N/A;   CORONARY STENT INTERVENTION N/A 10/02/2022   Procedure: CORONARY STENT INTERVENTION;  Surgeon: Darron Deatrice LABOR, MD;  Location: [facility];  Service: Cardiovascular;  Laterality: N/A;   ESOPHAGOGASTRODUODENOSCOPY (EGD) WITH PROPOFOL  N/A 09/26/2021   Procedure: ESOPHAGOGASTRODUODENOSCOPY (EGD) WITH PROPOFOL ;  Surgeon: Onita Elspeth Sharper, DO;  Location: [facility];  Service:  Gastroenterology;  Laterality: N/A;   ESOPHAGOGASTRODUODENOSCOPY ENDOSCOPY  2005, 2013, 2015   EYE SURGERY Bilateral    Cataract Extraction with IOL    HERNIA REPAIR     INSERTION OF MESH N/A 09/04/2023   Procedure: INSERTION OF MESH;  Surgeon: Marinda Jayson KIDD, MD;  Location: [facility];  Service: General;  Laterality: N/A;   JOINT REPLACEMENT     KNEE ARTHROPLASTY Left 07/12/2016   Procedure: COMPUTER ASSISTED TOTAL KNEE ARTHROPLASTY;  Surgeon: Mardee Lynwood SQUIBB, MD;  Location: [facility];  Service: Orthopedics;  Laterality: Left;   Laryngeal tear after intubation w/repair     LUMBAR DISC SURGERY  12/2009   REPLACEMENT TOTAL KNEE Right 06/2007   DR. HOOTEN, [facility]   REPLACEMENT UNICONDYLAR JOINT KNEE     DR. HOOTEN, [facility]   RIGHT/LEFT HEART CATH AND CORONARY ANGIOGRAPHY N/A 09/29/2022   Procedure: RIGHT/LEFT HEART CATH  AND CORONARY ANGIOGRAPHY;  Surgeon: Darron Deatrice LABOR, MD;  Location: [facility];  Service: Cardiovascular;  Laterality: N/A;   SHOULDER ARTHROSCOPY WITH ROTATOR CUFF REPAIR Right    TONSILLECTOMY     TOTAL HIP ARTHROPLASTY Right 01/31/2017   Procedure: TOTAL HIP ARTHROPLASTY;  Surgeon: Mardee Lynwood SQUIBB, MD;  Location: [facility];  Service: Orthopedics;  Laterality: Right;   TUBAL LIGATION     UPPER GI ENDOSCOPY     VENTRAL HERNIA REPAIR N/A 09/04/2023   Procedure: REPAIR, HERNIA, VENTRAL;  Surgeon: Marinda Jayson KIDD, MD;  Location: [facility];  Service: General;  Laterality: N/A;    Family History  Problem Relation Age of Onset   Asthma Mother    Breast cancer Mother 19   Emphysema Father        smoked   Lung cancer Father        smoked   Coronary artery disease Father 18    Social History:  reports that she has never smoked. She has never used smokeless tobacco. She reports that she does not drink alcohol and does not use drugs.  Allergies:  Allergies  Allergen Reactions   Amiodarone  Other (See Comments)    Fatigue, Nausea, anorexia, malaise   Lactose Other (See Comments) and Diarrhea   Other Other (See Comments)    Pt ONLY tolerates SLOW-FE (slow released iron )---upsets IBS   Biaxin [Clarithromycin] Other (See Comments)    GI upset    Hydrocodone -Acetaminophen  Itching    Tolerates acetaminophen     Oxycodone  Itching   Sulfa Antibiotics Other (See Comments) and Diarrhea    GI upset  GI upset GI upset    Sulfasalazine Diarrhea    Medications reviewed.    ROS Full ROS performed and is otherwise negative other than what is stated in HPI   BP (!) 140/82   Pulse 65   Temp 98 F (36.7 C) (Oral)   Ht 5' 4 (1.626 m)   Wt 188 lb (85.3 kg)   SpO2 92%   BMI 32.27 kg/m   Physical Exam Oral exam consistent with white plaque on her tongue consistent with thrush, cleared auscultation bilaterally but does seem to have some increased work of breathing on room  air, SpO2 similar to past visits.,  Abdomen is soft, wound is open and packed.  There is no evidence of erythema or purulence.    No results found for this or any previous visit (from the past 48 hours). No results found.  Assessment/Plan:  1. Thrush of mouth and esophagus (HCC) (Primary) Thrush secondary to antibiotic use will prescribe antibiotics -  nystatin  (MYCOSTATIN ) 100000 UNIT/ML suspension; Take 5 mLs (500,000 Units total) by mouth 4 (four) times daily.  Dispense: 60 mL; Refill: 0  Will continue to pack wound.  Will plan to see her in several weeks to see how the wound is healing.  I discussed with her that it will heal from the bottom up.  I am concerned about her breathing and I told her that if it worsens over the next 24 hours she should call her pulmonologist.  She does have an appointment with him on Monday and if she is able to wait then without worsening symptoms and I think this is appropriate.  A total of 34 minutes was spent reviewing the patient's chart, performing interval history and discussing treatment options with the patient   Jayson Endow, M.D. [facility]

## 2023-10-10 ENCOUNTER — Inpatient Hospital Stay

## 2023-10-10 ENCOUNTER — Telehealth: Payer: Self-pay | Admitting: Oncology

## 2023-10-10 NOTE — Telephone Encounter (Signed)
 Pt daughter called and left a vm to r/s pt appts.   I called and left vm for pt daughter. Appts have been r/s.

## 2023-10-16 ENCOUNTER — Inpatient Hospital Stay: Admitting: Oncology

## 2023-10-16 ENCOUNTER — Inpatient Hospital Stay

## 2023-10-17 ENCOUNTER — Telehealth: Payer: Self-pay | Admitting: Internal Medicine

## 2023-10-17 NOTE — Telephone Encounter (Signed)
 Called and spoke with patient's daughter - patient is sating in the upper 90's, heart rate/ BP  fluctuates with activities but within normal limits. Daughter said that sometimes it sounds more like patient is choking - advised ED precautions daughter verbalized understanding but asked to schedule an office visit - scheduled for Tuesday with Courtney Wilcox, Courtney Wilcox. Daughter is not with her mother and will call  back tomorrow if anything changes - stressed going to the ED, but they want to see in office first.

## 2023-10-17 NOTE — Telephone Encounter (Signed)
 Pt c/o Shortness Of Breath: STAT if SOB developed within the last 24 hours or pt is noticeably SOB on the phone  1. Are you currently SOB (can you hear that pt is SOB on the phone)? No  2. How long have you been experiencing SOB? About 1 week   3. Are you SOB when sitting or when up moving around? Both  4. Are you currently experiencing any other symptoms? Feeling a little fatigue

## 2023-10-18 ENCOUNTER — Encounter: Payer: Self-pay | Admitting: Nurse Practitioner

## 2023-10-18 ENCOUNTER — Encounter: Payer: Self-pay | Admitting: General Surgery

## 2023-10-18 ENCOUNTER — Ambulatory Visit: Attending: Nurse Practitioner | Admitting: Nurse Practitioner

## 2023-10-18 ENCOUNTER — Encounter: Payer: Self-pay | Admitting: Oncology

## 2023-10-18 ENCOUNTER — Ambulatory Visit: Admitting: General Surgery

## 2023-10-18 VITALS — BP 123/76 | HR 64 | Ht 64.0 in | Wt 196.0 lb

## 2023-10-18 VITALS — BP 130/90 | HR 65 | Ht 64.0 in | Wt 198.0 lb

## 2023-10-18 DIAGNOSIS — I2510 Atherosclerotic heart disease of native coronary artery without angina pectoris: Secondary | ICD-10-CM | POA: Insufficient documentation

## 2023-10-18 DIAGNOSIS — R0602 Shortness of breath: Secondary | ICD-10-CM | POA: Insufficient documentation

## 2023-10-18 DIAGNOSIS — Z09 Encounter for follow-up examination after completed treatment for conditions other than malignant neoplasm: Secondary | ICD-10-CM

## 2023-10-18 DIAGNOSIS — I4719 Other supraventricular tachycardia: Secondary | ICD-10-CM | POA: Insufficient documentation

## 2023-10-18 DIAGNOSIS — N183 Chronic kidney disease, stage 3 unspecified: Secondary | ICD-10-CM | POA: Diagnosis present

## 2023-10-18 DIAGNOSIS — I471 Supraventricular tachycardia, unspecified: Secondary | ICD-10-CM | POA: Insufficient documentation

## 2023-10-18 DIAGNOSIS — K436 Other and unspecified ventral hernia with obstruction, without gangrene: Secondary | ICD-10-CM

## 2023-10-18 DIAGNOSIS — K430 Incisional hernia with obstruction, without gangrene: Secondary | ICD-10-CM

## 2023-10-18 NOTE — Progress Notes (Signed)
 Office Visit    Patient Name: Courtney Wilcox Date of Encounter: 10/18/2023  Primary Care Provider:  Marikay Eva POUR, PA Primary Cardiologist:  Lonni Hanson, MD  Electrophysiologist:  OLE ONEIDA HOLTS, MD   Chief Complaint    78 y.o. female with a history of, PSVT, PE (November 2022), diastolic dysfunction, hyperlipidemia, stage III chronic kidney disease, asthma w/ post-COVID cough, B12 deficiency macrocytic anemia, depression, anxiety and rheumatoid arthritis, who presents for f/u related to dyspnea.  Past Medical History   Subjective   Past Medical History:  Diagnosis Date   Allergic rhinitis    Anxiety    Arthritis    Asthma    Atrophic vaginitis    B12 deficiency    Bronchitis, chronic (HCC)    CAD (coronary artery disease)    a. 03/2003 Cath: Nl cors; b. 09/2022 Staged PCI: LM nl, LAD 85p/14m (3.5x26 [employer] DES), RI small, mild dzs, LCX nl, OM2 mild dzs, RCA 30p.   Cervical disc disease    CKD (chronic kidney disease), stage III (HCC)    Collagen vascular disease    RA   Cough - post-COVID    Depression    Diastolic dysfunction    a. 11/2020 Echo: EF 45-50%, GrI DD; b. 10/2021 Echo: EF 60-65%, GrII DD; c. 09/2022 Echo: EF 55-60%, no rwma, GrI DD, nl RV fxn, mildly dil LA. No significant valvular dzs.   Fibrocystic breast disease    Foot ulcer (HCC)    GERD (gastroesophageal reflux disease)    Hammer toe    Hyperlipidemia    IBS (irritable bowel syndrome)    Macrocytic anemia    Macular degeneration 2023   Obesity    OSA (obstructive sleep apnea)    Osteoarthrosis    Multi site   Osteopenia    PSVT (paroxysmal supraventricular tachycardia)    a. 04/2022 Zio: Sinus rhythm @ 57 (42-101). Rare PACs/PVCs. 74290 SVT runs lasting up to 64m 12sec - max rate 222. 5 beats NSVT. Triggered events = sinus, SVT, PACs.   Rheumatoid arthritis (HCC)    Shingles    Thyroid  nodule    MULTIPLE   Tonsillitis    recurrent   Umbilical hernia    Past  Surgical History:  Procedure Laterality Date   BACK SURGERY  2011   LAMINECTOMY   BREAST BIOPSY Right    neg   CERVICAL FUSION  2011, 2012   X 2   COLONOSCOPY  2005, 2015   COLONOSCOPY WITH PROPOFOL  N/A 08/04/2020   Procedure: COLONOSCOPY WITH PROPOFOL ;  Surgeon: Dessa Reyes ORN, MD;  Location: [facility];  Service: Endoscopy;  Laterality: N/A;   CORONARY STENT INTERVENTION N/A 10/02/2022   Procedure: CORONARY STENT INTERVENTION;  Surgeon: Darron Deatrice LABOR, MD;  Location: [facility];  Service: Cardiovascular;  Laterality: N/A;   ESOPHAGOGASTRODUODENOSCOPY (EGD) WITH PROPOFOL  N/A 09/26/2021   Procedure: ESOPHAGOGASTRODUODENOSCOPY (EGD) WITH PROPOFOL ;  Surgeon: Onita Elspeth Sharper, DO;  Location: [facility] ENDOSCOPY;  Service: Gastroenterology;  Laterality: N/A;   ESOPHAGOGASTRODUODENOSCOPY ENDOSCOPY  2005, 2013, 2015   EYE SURGERY Bilateral    Cataract Extraction with IOL    HERNIA REPAIR     INSERTION OF MESH N/A 09/04/2023   Procedure: INSERTION OF MESH;  Surgeon: Marinda Jayson KIDD, MD;  Location: [facility] OR[facility];  Service: General;  Laterality: N/A;   JOINT REPLACEMENT     KNEE ARTHROPLASTY Left 07/12/2016   Procedure: COMPUTER ASSISTED TOTAL KNEE ARTHROPLASTY;  Surgeon: Mardee Lynwood SQUIBB, MD;  Location: [facility] OR[facility];  Service: Orthopedics;  Laterality: Left;   Laryngeal tear after intubation w/repair     LUMBAR DISC SURGERY  12/2009   REPLACEMENT TOTAL KNEE Right 06/2007   DR. HOOTEN, [facility]   REPLACEMENT UNICONDYLAR JOINT KNEE     DR. HOOTEN, [facility]   RIGHT/LEFT HEART CATH AND CORONARY ANGIOGRAPHY N/A 09/29/2022   Procedure: RIGHT/LEFT HEART CATH AND CORONARY ANGIOGRAPHY;  Surgeon: Darron Deatrice LABOR, MD;  Location: [facility];  Service: Cardiovascular;  Laterality: N/A;   SHOULDER ARTHROSCOPY WITH ROTATOR CUFF REPAIR Right    TONSILLECTOMY     TOTAL HIP ARTHROPLASTY Right 01/31/2017   Procedure: TOTAL HIP ARTHROPLASTY;  Surgeon: Mardee Lynwood SQUIBB, MD;  Location: [facility];   Service: Orthopedics;  Laterality: Right;   TUBAL LIGATION     UPPER GI ENDOSCOPY     VENTRAL HERNIA REPAIR N/A 09/04/2023   Procedure: REPAIR, HERNIA, VENTRAL;  Surgeon: Marinda Jayson KIDD, MD;  Location: [facility] OR[facility];  Service: General;  Laterality: N/A;    Allergies  Allergies  Allergen Reactions   Amiodarone  Other (See Comments)    Fatigue, Nausea, anorexia, malaise   Lactose Other (See Comments) and Diarrhea   Hydrocodone -Acetaminophen  Itching   Other Other (See Comments)    Pt ONLY tolerates SLOW-FE (slow released iron )---upsets IBS   Biaxin [Clarithromycin] Other (See Comments)    GI upset    Hydrocodone -Acetaminophen  Itching    Tolerates acetaminophen     Oxycodone  Itching   Sulfa Antibiotics Diarrhea and Other (See Comments)    GI upset   Sulfasalazine Diarrhea       History of Present Illness      78 y.o. y/o female with a history of, PSVT, PE (November 2022), diastolic dysfunction, hyperlipidemia stage III chronic kidney disease, asthma w/ post-COVID cough, B12 deficiency macrocytic anemia, depression, anxiety and rheumatoid arthritis.  She previously underwent diagnostic catheterization 2005, which showed normal coronary arteries.  More recently, she was seen by Dr. Cindie in June 2023 in the setting of PSVT, which was felt to be atrial tachycardia.  She was switched from short-acting to long-acting metoprolol  with subsequent plan to place a loop monitor to assess overall SVT burden.  This was subsequently deferred due to perceived low burden.  Echo in October 2023 showed EF 60 to 65% with grade 2 diastolic dysfunction and mild to moderate mitral regurgitation.  She has also been followed by [facility] pulmonology in the setting of persistent dyspnea, asthma, post-COVID cough, and history of PE on oral anticoagulation.  [name] established general cardiology care with Dr. Mady in January 2024.  At that time, she was having ongoing dyspnea and intermittent chest discomfort.  It  was felt that if her symptoms didn't improve following pulmonology medical interventions, or iron  infusions in the setting of ongoing anemia, diagnostic catheterization may become appropriate.  At April 2024 follow-up, she was noted to have recurrent SVT.  14-day ZIO monitor was placed and subsequently showed 25,700 SVT runs lasting up to 40 minutes at a maximum rate of 222 bpm.  Average resting rate was 57.  She was reevaluated by electrophysiology and switched from Toprol -XL to diltiazem , which was subsequently titrated to 180 mg daily due to ongoing tachycardia, w/o significant improvement in overall burden when compared to toprol  xl therapy.  At September 27, 2022 cardiology follow-up, Ms. Connon c/o or dyspnea substernal chest tightness.  She was referred to the emergency department due to active chest pain, and ruled out for MI.  She  underwent diagnostic catheterization on September 13, which showed severe proximal LAD disease.  She had frequent runs of SVT during the procedure, which made angiography difficult and she was placed on amiodarone  and subsequently underwent staged PCI drug-eluting stent placement of the proximal and mid LAD on September 16.  Following initiation of amiodarone , she had significant improvement in SVT burden and she was discharged home on oral amiodarone  therapy as well as dual antiplatelet therapy.  Eliquis  was not resumed as it had been greater than 12 months since PE.  Unfortunately, she had significant nausea, anorexia, and lethargy on amio, and this was d/c'd @ October 2024 f/u visit, w/ resolution of GI symptoms but more frequent palpitations and elevated heart rates at home in the absence of sustained arrhythmias.  She was seen by EP and is not felt to be a candidate for a 1C agent or tikosyn due to CAD and CKD respectively.    In April of this year, she was admitted with asthma exacerbation and demand ischemia with elevated troponin.  Echo was performed and showed an EF  of 60 to 65% with mild LVH, normal RV function, RVSP 23.6 mmHg, and aortic sclerosis.  Troponin elevation was felt to be secondary to demand ischemia in the setting of tachycardia and asthma exacerbation.  No further ischemic evaluation was warranted.  In most recent EP follow-up in June 2025, she was changed to diltiazem  180 mg twice daily for better 24-hour coverage.     Over the past 2 months, patient has been experiencing worsening dyspnea on exertion associated with tightness in her upper airway/throat, choking sensation, and wheezing.  Symptoms occur after walking 10 to 15 feet.  She does not experience chest pain/tightness.  She denies palpitations, PND, orthopnea, dizziness, syncope, edema, or early satiety.  She was admitted in August for ventral hernia repair with relatively uneventful postoperative course.  She was recently seen by pulmonology on September 29 with concern for exacerbation related to asthma or allergic reaction.  She was given a Solu-Medrol  injection and subsequently started prednisone  taper beginning last night.  Following Solu-Medrol  injection, she did note some improvement in her dyspnea/wheezing/throat tightness.  There was concern on ECG regarding possible AV block and QT prolongation, prompting follow-up today.  In addition, her daughter has noted variable heart rates when checking her pulse ox at home, with rates very frequently trending in the mid 60s.  Patient has no complaints at rest today.  ECG today shows sinus rhythm with frequent PACs and heart rate of 65.  No evidence of AV block.  QTc mildly prolonged at 461, which is overall stable. Objective   Home Medications    Current Outpatient Medications  Medication Sig Dispense Refill   acetaminophen  (TYLENOL ) 500 MG tablet Take 1,000 mg by mouth 3 (three) times daily as needed for mild pain.      albuterol  (VENTOLIN  HFA) 108 (90 Base) MCG/ACT inhaler Inhale 2 puffs into the lungs every 4 (four) hours as needed.      budesonide -formoterol  (SYMBICORT ) 160-4.5 MCG/ACT inhaler Inhale 2 puffs into the lungs daily.     cephALEXin  (KEFLEX ) 500 MG capsule Take 500 mg by mouth 2 (two) times daily.     Cholecalciferol  (VITAMIN D ) 2000 units tablet Take 2,000 Units by mouth daily.     clopidogrel  (PLAVIX ) 75 MG tablet TAKE 1 TABLET BY MOUTH DAILY WITH BREAKFAST. 90 tablet 3   Cyanocobalamin 1000 MCG/ML KIT Inject as directed every 30 (thirty) days.  diltiazem  (CARDIZEM  CD) 180 MG 24 hr capsule Take 1 capsule (180 mg total) by mouth in the morning and at bedtime. 180 capsule 3   Dupilumab 300 MG/2ML SOPN Inject 4 mLs into the skin every 14 (fourteen) days.     EPINEPHrine  0.3 mg/0.3 mL IJ SOAJ injection Inject 0.3 mg into the muscle as needed.     famotidine  (PEPCID ) 20 MG tablet Take 20 mg by mouth daily as needed.     Fluticasone -Umeclidin-Vilant 100-62.5-25 MCG/ACT AEPB Inhale into the lungs. Inhale 1 Puff into the lungs once daily     gabapentin  (NEURONTIN ) 300 MG capsule Take 300 mg by mouth at bedtime.     leflunomide  (ARAVA ) 10 MG tablet Take 10 mg by mouth daily.     levalbuterol  (XOPENEX ) 0.63 MG/3ML nebulizer solution Take 0.63 mg by nebulization every 6 (six) hours as needed for wheezing.     meclizine (ANTIVERT) 12.5 MG tablet PLEASE SEE ATTACHED FOR DETAILED DIRECTIONS     montelukast  (SINGULAIR ) 10 MG tablet Take 10 mg by mouth daily as needed (for allergies.).      nystatin  (MYCOSTATIN ) 100000 UNIT/ML suspension Take 5 mLs by mouth as needed.     nystatin  (MYCOSTATIN ) 100000 UNIT/ML suspension Take 5 mLs (500,000 Units total) by mouth 4 (four) times daily. 60 mL 0   pantoprazole  (PROTONIX ) 40 MG tablet Take 40 mg by mouth daily.     predniSONE  (DELTASONE ) 2.5 MG tablet Take 2 tablets (5 mg total) by mouth daily with breakfast. Resume after finishing high doses of prednisone      sertraline  (ZOLOFT ) 25 MG tablet Take 50 mg by mouth daily. 50 mg total     Simethicone  180 MG CAPS Take 1-2 capsules by mouth  as needed.     triamcinolone  (NASACORT ) 55 MCG/ACT AERO nasal inhaler Place 2 sprays into the nose daily.     No current facility-administered medications for this visit.     Physical Exam    VS:  BP (!) 130/90 (BP Location: Left Arm, Patient Position: Sitting, Cuff Size: Large)   Pulse 65   Ht 5' 4 (1.626 m)   Wt 198 lb (89.8 kg)   SpO2 98%   BMI 33.99 kg/m  , BMI Body mass index is 33.99 kg/m.          GEN: Obese, in no acute distress. HEENT: normal. Neck: Supple, no JVD, carotid bruits, or masses. Cardiac: Irregular, no murmurs, rubs, or gallops. No clubbing, cyanosis, edema.  Radials 2+/PT 2+ and equal bilaterally.  Respiratory:  Respirations regular and unlabored, clear to auscultation bilaterally on posterior auscultation.  Anteriorly, mild expiratory wheezing noted in the upper chest. GI: Soft, nontender, nondistended, BS + x 4. MS: no deformity or atrophy. Skin: warm and dry, no rash. Neuro:  Strength and sensation are intact. Psych: Normal affect.  Accessory Clinical Findings    ECG personally reviewed by me today - EKG Interpretation Date/Time:  Thursday October 18 2023 15:38:12 EDT Ventricular Rate:  65 PR Interval:  142 QRS Duration:  104 QT Interval:  444 QTC Calculation: 461 R Axis:   -15  Text Interpretation: Sinus rhythm with Premature atrial complexes Confirmed by Vivienne Bruckner [phone]) on 10/18/2023 6:05:33 PM   - no acute changes.  Lab Results  Component Value Date   WBC 11.9 (H) 09/06/2023   HGB 10.3 (L) 09/06/2023   HCT 32.3 (L) 09/06/2023   MCV 103.2 (H) 09/06/2023   PLT 191 09/06/2023   Lab Results  Component Value  Date   CREATININE 1.40 (H) 09/06/2023   BUN 20 09/06/2023   NA 138 09/06/2023   K 4.7 09/06/2023   CL 102 09/06/2023   CO2 27 09/06/2023   Lab Results  Component Value Date   ALT 25 08/10/2023   AST 34 08/10/2023   ALKPHOS 93 08/10/2023   BILITOT 0.7 08/10/2023   Lab Results  Component Value Date   TSH 0.649  04/26/2023       Assessment & Plan    1.  Dyspnea on exertion/asthma: 33-month history of progressive dyspnea on exertion associated with upper airway tightness, choking sensation, and wheezing.  Seen by pulmonology and placed on prednisone  taper with some improvement in symptoms and just 2 days.  Current symptoms are similar to but not as severe as what she experienced with asthma exacerbation earlier this year, which landed her in the hospital.  She has not been having any chest tightness.  Echocardiogram earlier this year showed normal LV and RV function.  We discussed the potential role of ischemic evaluation for progressive dyspnea but collectively agreed to hold off at this time in light of active treatment for asthma exacerbation.  Can recent reconsider ischemic evaluation pending response to steroids and also ENT evaluation next week.  2.  Coronary artery disease: Status post PCI and drug-eluting stent placement to LAD 2024.  Demand ischemia in the setting of tachycardia and asthma and April 2025 with normal EF on echo at that time.  As above, she has been having dyspnea with upper airway tightness and is being treated for asthma with plan for follow-up with ENT next week.  She has not been having any chest pain.  Prior anginal equivalent was substernal chest heaviness.  If dyspnea persists despite adequate treatment for above, can consider stress testing although would have to be very careful with Lexiscan given reactive airway disease.  She remains on clopidogrel .  Statin previously discontinued due to elevated LFTs.  3.  PSVT/atrial tachycardia:  >25k SVT episodes on monitoring in 2024, lasting up to 40 mins.  Seen by EP.  Did not tolerate amio.  Poor candidate for 1C agent or Tikosyn.  Follow-up ZIO monitor in May 2025 at 13,844 SVT episodes.  Subsequently seen by EP with change in diltiazem  CD to 180 mg twice daily instead of 240 mg daily.  Patient has not been having any palpitations.  She has  had progressive dyspnea as outlined above.  Recently seen by pulmonology and was tachycardic with concern for what is described as AV block.  That ECG is not available for review today.  ECG here however, shows sinus rhythm at 64 with frequent PACs.  Heart rates typically trend in the 60s at home.  Continue diltiazem  100 mg twice daily.  4.  Hyperlipidemia: Off statin due to elevated LFTs while on statin therapy in the past.  Given multiple other concerns today we did not discuss alternatives to statin therapy and will plan to discuss at follow-up visit.  5.  Stage III chronic kidney disease: Creatinine 1.40 in August.  6.  Macrocytic anemia: Stable by H&H in August 2025.  Followed by hematology.  7.  Disposition: Follow-up in 1 month or sooner if necessary.  Lonni Meager, NP 10/18/2023, 6:06 PM

## 2023-10-18 NOTE — Patient Instructions (Addendum)
 Continue to pack the wound daily and change the bandage. Keep it clean and dry. If you have any questions or concerns please don't hesitate to call the office.

## 2023-10-18 NOTE — Patient Instructions (Signed)
 Medication Instructions:   Your physician recommends that you continue on your current medications as directed. Please refer to the Current Medication list given to you today.    *If you need a refill on your cardiac medications before your next appointment, please call your pharmacy*  Lab Work:  None ordered at this time   If you have labs (blood work) drawn today and your tests are completely normal, you will receive your results only by:  MyChart Message (if you have MyChart) OR  A paper copy in the mail If you have any lab test that is abnormal or we need to change your treatment, we will call you to review the results.  Testing/Procedures:  None ordered at this time   Referrals:  None ordered at this time   Follow-Up:  At [facility], you and your health needs are our priority.  As part of our continuing mission to provide you with exceptional heart care, our providers are all part of one team.  This team includes your primary Cardiologist (physician) and Advanced Practice Providers or APPs (Physician Assistants and Nurse Practitioners) who all work together to provide you with the care you need, when you need it.  Your next appointment:   1 month(s)  Provider:    Lonni Hanson, MD or Lonni Meager, NP    We recommend signing up for the patient portal called MyChart.  Sign up information is provided on this After Visit Summary.  MyChart is used to connect with patients for Virtual Visits (Telemedicine).  Patients are able to view lab/test results, encounter notes, upcoming appointments, etc.  Non-urgent messages can be sent to your provider as well.   To learn more about what you can do with MyChart, go to [email or web address].   Other Instructions None ordered at this time

## 2023-10-23 ENCOUNTER — Ambulatory Visit: Admitting: Medical

## 2023-10-30 ENCOUNTER — Encounter: Payer: Self-pay | Admitting: Otolaryngology

## 2023-11-02 ENCOUNTER — Other Ambulatory Visit: Payer: Self-pay | Admitting: Otolaryngology

## 2023-11-02 DIAGNOSIS — J398 Other specified diseases of upper respiratory tract: Secondary | ICD-10-CM

## 2023-11-02 DIAGNOSIS — R0602 Shortness of breath: Secondary | ICD-10-CM

## 2023-11-04 NOTE — Progress Notes (Signed)
 Outpatient Surgical Follow Up    Courtney Wilcox is an 78 y.o. female.   Chief Complaint  Patient presents with   Routine Post Op    HPI: Patient returns today to see me for wound check.  She had a ventral hernia repair and then had some wound dehiscence with some erythema around this.  She was started on Keflex  for this.  However, she unfortunately lost her Keflex  and I had to represcribe it.  She reports doing well since last time I saw her save for increasingly shortness of breath.  She has back pain and would hide feels like it is healing and well.  She denies any purulent drainage.  She denies any fevers.   Past Medical History:  Diagnosis Date   Allergic rhinitis    Anxiety    Arthritis    Asthma    Atrophic vaginitis    B12 deficiency    Bronchitis, chronic (HCC)    CAD (coronary artery disease)    a. 03/2003 Cath: Nl cors; b. 09/2022 Staged PCI: LM nl, LAD 85p/74m (3.5x26 Onyx Frontier DES), RI small, mild dzs, LCX nl, OM2 mild dzs, RCA 30p.   Cervical disc disease    CKD (chronic kidney disease), stage III (HCC)    Collagen vascular disease    RA   Cough - post-COVID    Depression    Diastolic dysfunction    a. 11/2020 Echo: EF 45-50%, GrI DD; b. 10/2021 Echo: EF 60-65%, GrII DD; c. 09/2022 Echo: EF 55-60%, no rwma, GrI DD, nl RV fxn, mildly dil LA. No significant valvular dzs.   Fibrocystic breast disease    Foot ulcer (HCC)    GERD (gastroesophageal reflux disease)    Hammer toe    Hyperlipidemia    IBS (irritable bowel syndrome)    Macrocytic anemia    Macular degeneration 2023   Obesity    OSA (obstructive sleep apnea)    Osteoarthrosis    Multi site   Osteopenia    PSVT (paroxysmal supraventricular tachycardia)    a. 04/2022 Zio: Sinus rhythm @ 57 (42-101). Rare PACs/PVCs. 74290 SVT runs lasting up to 70m 12sec - max rate 222. 5 beats NSVT. Triggered events = sinus, SVT, PACs.   Rheumatoid arthritis (HCC)    Shingles    Thyroid  nodule    MULTIPLE    Tonsillitis    recurrent   Umbilical hernia     Past Surgical History:  Procedure Laterality Date   BACK SURGERY  2011   LAMINECTOMY   BREAST BIOPSY Right    neg   CERVICAL FUSION  2011, 2012   X 2   COLONOSCOPY  2005, 2015   COLONOSCOPY WITH PROPOFOL  N/A 08/04/2020   Procedure: COLONOSCOPY WITH PROPOFOL ;  Surgeon: Dessa Reyes ORN, MD;  Location: [facility] ENDOSCOPY;  Service: Endoscopy;  Laterality: N/A;   CORONARY STENT INTERVENTION N/A 10/02/2022   Procedure: CORONARY STENT INTERVENTION;  Surgeon: Darron Deatrice LABOR, MD;  Location: [facility] INVASIVE CV LAB;  Service: Cardiovascular;  Laterality: N/A;   ESOPHAGOGASTRODUODENOSCOPY (EGD) WITH PROPOFOL  N/A 09/26/2021   Procedure: ESOPHAGOGASTRODUODENOSCOPY (EGD) WITH PROPOFOL ;  Surgeon: Onita Elspeth Sharper, DO;  Location: [facility] ENDOSCOPY;  Service: Gastroenterology;  Laterality: N/A;   ESOPHAGOGASTRODUODENOSCOPY ENDOSCOPY  2005, 2013, 2015   EYE SURGERY Bilateral    Cataract Extraction with IOL    HERNIA REPAIR     INSERTION OF MESH N/A 09/04/2023   Procedure: INSERTION OF MESH;  Surgeon: Marinda Jayson KIDD, MD;  Location: [facility]  ORS;  Service: General;  Laterality: N/A;   JOINT REPLACEMENT     KNEE ARTHROPLASTY Left 07/12/2016   Procedure: COMPUTER ASSISTED TOTAL KNEE ARTHROPLASTY;  Surgeon: Mardee Lynwood SQUIBB, MD;  Location: [facility] ORS;  Service: Orthopedics;  Laterality: Left;   Laryngeal tear after intubation w/repair     LUMBAR DISC SURGERY  12/2009   REPLACEMENT TOTAL KNEE Right 06/2007   DR. HOOTEN, [facility]   REPLACEMENT UNICONDYLAR JOINT KNEE     DR. HOOTEN, [facility]   RIGHT/LEFT HEART CATH AND CORONARY ANGIOGRAPHY N/A 09/29/2022   Procedure: RIGHT/LEFT HEART CATH AND CORONARY ANGIOGRAPHY;  Surgeon: Darron Deatrice LABOR, MD;  Location: [facility] INVASIVE CV LAB;  Service: Cardiovascular;  Laterality: N/A;   SHOULDER ARTHROSCOPY WITH ROTATOR CUFF REPAIR Right    TONSILLECTOMY     TOTAL HIP ARTHROPLASTY Right 01/31/2017   Procedure: TOTAL HIP ARTHROPLASTY;   Surgeon: Mardee Lynwood SQUIBB, MD;  Location: [facility] ORS;  Service: Orthopedics;  Laterality: Right;   TUBAL LIGATION     UPPER GI ENDOSCOPY     VENTRAL HERNIA REPAIR N/A 09/04/2023   Procedure: REPAIR, HERNIA, VENTRAL;  Surgeon: Marinda Jayson KIDD, MD;  Location: [facility] ORS;  Service: General;  Laterality: N/A;    Family History  Problem Relation Age of Onset   Asthma Mother    Breast cancer Mother 39   Emphysema Father        smoked   Lung cancer Father        smoked   Coronary artery disease Father 1    Social History:  reports that she has never smoked. She has never used smokeless tobacco. She reports that she does not drink alcohol and does not use drugs.  Allergies:  Allergies  Allergen Reactions   Amiodarone  Other (See Comments)    Fatigue, Nausea, anorexia, malaise   Lactose Other (See Comments) and Diarrhea   Hydrocodone -Acetaminophen  Itching   Other Other (See Comments)    Pt ONLY tolerates SLOW-FE (slow released iron )---upsets IBS   Biaxin [Clarithromycin] Other (See Comments)    GI upset    Hydrocodone -Acetaminophen  Itching    Tolerates acetaminophen     Oxycodone  Itching   Sulfa Antibiotics Diarrhea and Other (See Comments)    GI upset   Sulfasalazine Diarrhea    Medications reviewed.    ROS Full ROS performed and is otherwise negative other than what is stated in HPI   BP 123/76   Pulse 64   Ht 5' 4 (1.626 m)   Wt 196 lb (88.9 kg)   SpO2 99%   BMI 33.64 kg/m   Physical Exam  Abdomen is obese, nontender and nondistended.  Open wound is healing well with good granulation tissue at the base.  This was repacked with gauze.   No results found for this or any previous visit (from the past 48 hours). No results found.  Assessment/Plan:  Patient status post ventral hernia repair.  Did have some erythema around her incision that opened.  She has been using some antibiotics for this.  The wound is healing in nicely with good granulation tissue.  Continue  to pack the wound and we will see her in 4 weeks.   Jayson Marinda, M.D. [facility]

## 2023-11-05 ENCOUNTER — Inpatient Hospital Stay: Admission: RE | Admit: 2023-11-05 | Source: Ambulatory Visit

## 2023-11-06 ENCOUNTER — Ambulatory Visit
Admission: RE | Admit: 2023-11-06 | Discharge: 2023-11-06 | Disposition: A | Discharge status: Home / Self Care | Source: Ambulatory Visit | Attending: Otolaryngology | Admitting: Otolaryngology

## 2023-11-06 ENCOUNTER — Other Ambulatory Visit

## 2023-11-06 DIAGNOSIS — R0602 Shortness of breath: Secondary | ICD-10-CM

## 2023-11-06 DIAGNOSIS — J398 Other specified diseases of upper respiratory tract: Secondary | ICD-10-CM

## 2023-11-06 MED ORDER — IOPAMIDOL (ISOVUE-300) INJECTION 61%
75.0000 mL | Freq: Once | INTRAVENOUS | Status: AC | PRN
  Administered 2023-11-06: 50 mL via INTRAVENOUS

## 2023-11-08 ENCOUNTER — Inpatient Hospital Stay

## 2023-11-08 ENCOUNTER — Inpatient Hospital Stay: Attending: Oncology

## 2023-11-08 DIAGNOSIS — M069 Rheumatoid arthritis, unspecified: Secondary | ICD-10-CM | POA: Diagnosis not present

## 2023-11-08 DIAGNOSIS — D61818 Other pancytopenia: Secondary | ICD-10-CM | POA: Insufficient documentation

## 2023-11-08 DIAGNOSIS — N189 Chronic kidney disease, unspecified: Secondary | ICD-10-CM | POA: Diagnosis present

## 2023-11-08 DIAGNOSIS — D631 Anemia in chronic kidney disease: Secondary | ICD-10-CM | POA: Diagnosis present

## 2023-11-08 DIAGNOSIS — D638 Anemia in other chronic diseases classified elsewhere: Secondary | ICD-10-CM | POA: Insufficient documentation

## 2023-11-08 LAB — CBC WITH DIFFERENTIAL (CANCER CENTER ONLY)
Abs Immature Granulocytes: 0.05 K/uL (ref 0.00–0.07)
Basophils Absolute: 0.1 K/uL (ref 0.0–0.1)
Basophils Relative: 1 %
Eosinophils Absolute: 0.3 K/uL (ref 0.0–0.5)
Eosinophils Relative: 3 %
HCT: 37.1 % (ref 36.0–46.0)
Hemoglobin: 11.5 g/dL — ABNORMAL LOW (ref 12.0–15.0)
Immature Granulocytes: 1 %
Lymphocytes Relative: 17 %
Lymphs Abs: 1.5 K/uL (ref 0.7–4.0)
MCH: 32.8 pg (ref 26.0–34.0)
MCHC: 31 g/dL (ref 30.0–36.0)
MCV: 105.7 fL — ABNORMAL HIGH (ref 80.0–100.0)
Monocytes Absolute: 0.8 K/uL (ref 0.1–1.0)
Monocytes Relative: 9 %
Neutro Abs: 6.1 K/uL (ref 1.7–7.7)
Neutrophils Relative %: 69 %
Platelet Count: 175 K/uL (ref 150–400)
RBC: 3.51 MIL/uL — ABNORMAL LOW (ref 3.87–5.11)
RDW: 14.3 % (ref 11.5–15.5)
WBC Count: 8.8 K/uL (ref 4.0–10.5)
nRBC: 0 % (ref 0.0–0.2)

## 2023-11-08 LAB — FERRITIN: Ferritin: 44 ng/mL (ref 11–307)

## 2023-11-08 LAB — RETIC PANEL
Immature Retic Fract: 15.1 % (ref 2.3–15.9)
RBC.: 3.48 MIL/uL — ABNORMAL LOW (ref 3.87–5.11)
Retic Count, Absolute: 69.9 K/uL (ref 19.0–186.0)
Retic Ct Pct: 2 % (ref 0.4–3.1)
Reticulocyte Hemoglobin: 35.3 pg (ref >27.9)

## 2023-11-08 LAB — IRON AND TIBC
Iron: 81 ug/dL (ref 28–170)
Saturation Ratios: 26 % (ref 10.4–31.8)
TIBC: 315 ug/dL (ref 250–450)
UIBC: 234 ug/dL

## 2023-11-13 ENCOUNTER — Ambulatory Visit

## 2023-11-13 ENCOUNTER — Ambulatory Visit: Admitting: Oncology

## 2023-11-14 ENCOUNTER — Encounter: Payer: Self-pay | Admitting: Oncology

## 2023-11-14 ENCOUNTER — Inpatient Hospital Stay (HOSPITAL_BASED_OUTPATIENT_CLINIC_OR_DEPARTMENT_OTHER): Admitting: Oncology

## 2023-11-14 ENCOUNTER — Inpatient Hospital Stay

## 2023-11-14 VITALS — BP 149/91 | HR 86 | Temp 96.5°F | Resp 16 | Wt 205.0 lb

## 2023-11-14 VITALS — BP 127/64 | HR 79 | Temp 96.0°F | Resp 18

## 2023-11-14 DIAGNOSIS — N189 Chronic kidney disease, unspecified: Secondary | ICD-10-CM | POA: Diagnosis not present

## 2023-11-14 DIAGNOSIS — D638 Anemia in other chronic diseases classified elsewhere: Secondary | ICD-10-CM | POA: Diagnosis not present

## 2023-11-14 DIAGNOSIS — D631 Anemia in chronic kidney disease: Secondary | ICD-10-CM

## 2023-11-14 MED ORDER — SODIUM CHLORIDE 0.9% FLUSH
10.0000 mL | Freq: Once | INTRAVENOUS | Status: AC | PRN
  Administered 2023-11-14: 10 mL
  Filled 2023-11-14: qty 10

## 2023-11-14 MED ORDER — IRON SUCROSE 20 MG/ML IV SOLN
200.0000 mg | Freq: Once | INTRAVENOUS | Status: AC
  Administered 2023-11-14: 200 mg via INTRAVENOUS

## 2023-11-14 NOTE — Assessment & Plan Note (Signed)
 Anemia due to chronic disease, inflammation, CKD.  Bone marrow biopsy showed Normocellular bone marrow (20-30%) with trilineage hematopoiesis and no increase in blasts no dysplasia, normal cytogenetics.  Hb is >10, no need for retacrit .  Lab Results  Component Value Date   HGB 11.5 (L) 11/08/2023   TIBC 315 11/08/2023   IRONPCTSAT 26 11/08/2023   FERRITIN 44 11/08/2023   Recommend Venofer  200mg  x 1 to improve iron  store

## 2023-11-14 NOTE — Progress Notes (Signed)
 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  Bone marrow biopsy showed Normocellular bone marrow (20-30%) with trilineage hematopoiesis and no increase in blasts no dysplasia, normal cytogenetics.  Hb is >10, no need for retacrit .  Lab Results  Component Value Date   HGB 11.5 (L) 11/08/2023   TIBC 315 11/08/2023   IRONPCTSAT 26 11/08/2023   FERRITIN 44 11/08/2023   Recommend Venofer  200mg  x 1 to improve iron  store     Orders Placed This Encounter  Procedures   CBC with Differential (Cancer Center Only)    Standing Status:   Future    Expected Date:   05/14/2024    Expiration Date:   08/12/2024   Iron  and TIBC    Standing Status:   Future    Expected Date:   05/14/2024    Expiration Date:   08/12/2024   Ferritin    Standing Status:   Future    Expected Date:   05/14/2024    Expiration Date:   08/12/2024   Retic Panel    Standing Status:   Future    Expected Date:   05/14/2024    Expiration Date:   08/12/2024    Follow up in 6 months.  All questions were answered. The patient knows to call the clinic with any problems, questions or concerns.  Zelphia Cap, MD, PhD [facility] Hematology Oncology 11/14/2023      INTERVAL HISTORY Courtney Wilcox is a 78 y.o. female who has above history reviewed by me today presents for follow up visit for management of pancytopenia. 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  Recent surgery of hernia repair.    Review of Systems  Constitutional:  Positive for fatigue. Negative for appetite change, chills and fever.  HENT:   Negative for hearing loss and voice change.   Eyes:  Negative for eye problems.  Respiratory:  Positive for  shortness of breath. Negative for chest tightness and cough.   Cardiovascular:  Negative for chest pain.  Gastrointestinal:  Negative for abdominal distention and blood in stool.  Endocrine: Negative for hot flashes.  Genitourinary:  Negative for difficulty urinating and frequency.   Musculoskeletal:  Positive for arthralgias.  Skin:  Negative for itching and rash.  Neurological:  Negative for extremity weakness.  Hematological:  Negative for adenopathy.  Psychiatric/Behavioral:  Negative for confusion.       Allergies  Allergen Reactions   Amiodarone  Other (See Comments)    Fatigue, Nausea, anorexia, malaise   Lactose Other (See Comments) and Diarrhea   Hydrocodone -Acetaminophen  Itching   Other Other (See Comments)    Pt ONLY tolerates SLOW-FE (slow released iron )---upsets IBS   Biaxin [Clarithromycin] Other (See Comments)    GI upset    Hydrocodone -Acetaminophen  Itching    Tolerates acetaminophen     Oxycodone  Itching   Sulfa Antibiotics Diarrhea and Other (See Comments)    GI upset   Sulfasalazine Diarrhea     Past Medical History:  Diagnosis Date   Allergic rhinitis    Anxiety    Arthritis    Asthma    Atrophic vaginitis    B12 deficiency    Bronchitis, chronic (HCC)    CAD (coronary artery disease)    a. 03/2003 Cath:  Nl cors; b. 09/2022 Staged PCI: LM nl, LAD 85p/52m (3.5x26 Onyx Frontier DES), RI small, mild dzs, LCX nl, OM2 mild dzs, RCA 30p.   Cervical disc disease    CKD (chronic kidney disease), stage III (HCC)    Collagen vascular disease    RA   Cough - post-COVID    Depression    Diastolic dysfunction    a. 11/2020 Echo: EF 45-50%, GrI DD; b. 10/2021 Echo: EF 60-65%, GrII DD; c. 09/2022 Echo: EF 55-60%, no rwma, GrI DD, nl RV fxn, mildly dil LA. No significant valvular dzs.   Fibrocystic breast disease    Foot ulcer (HCC)    GERD (gastroesophageal reflux disease)    Hammer toe    Hyperlipidemia    IBS (irritable bowel syndrome)    Macrocytic anemia     Macular degeneration 2023   Obesity    OSA (obstructive sleep apnea)    Osteoarthrosis    Multi site   Osteopenia    PSVT (paroxysmal supraventricular tachycardia)    a. 04/2022 Zio: Sinus rhythm @ 57 (42-101). Rare PACs/PVCs. 74290 SVT runs lasting up to 20m 12sec - max rate 222. 5 beats NSVT. Triggered events = sinus, SVT, PACs.   Rheumatoid arthritis (HCC)    Shingles    Thyroid  nodule    MULTIPLE   Tonsillitis    recurrent   Umbilical hernia      Past Surgical History:  Procedure Laterality Date   BACK SURGERY  2011   LAMINECTOMY   BREAST BIOPSY Right    neg   CERVICAL FUSION  2011, 2012   X 2   COLONOSCOPY  2005, 2015   COLONOSCOPY WITH PROPOFOL  N/A 08/04/2020   Procedure: COLONOSCOPY WITH PROPOFOL ;  Surgeon: Dessa Reyes ORN, MD;  Location: [facility] ENDOSCOPY;  Service: Endoscopy;  Laterality: N/A;   CORONARY STENT INTERVENTION N/A 10/02/2022   Procedure: CORONARY STENT INTERVENTION;  Surgeon: Darron Deatrice LABOR, MD;  Location: [facility] INVASIVE CV LAB;  Service: Cardiovascular;  Laterality: N/A;   ESOPHAGOGASTRODUODENOSCOPY (EGD) WITH PROPOFOL  N/A 09/26/2021   Procedure: ESOPHAGOGASTRODUODENOSCOPY (EGD) WITH PROPOFOL ;  Surgeon: Onita Elspeth Sharper, DO;  Location: [facility] ENDOSCOPY;  Service: Gastroenterology;  Laterality: N/A;   ESOPHAGOGASTRODUODENOSCOPY ENDOSCOPY  2005, 2013, 2015   EYE SURGERY Bilateral    Cataract Extraction with IOL    HERNIA REPAIR     INSERTION OF MESH N/A 09/04/2023   Procedure: INSERTION OF MESH;  Surgeon: Marinda Jayson KIDD, MD;  Location: [facility] ORS;  Service: General;  Laterality: N/A;   JOINT REPLACEMENT     KNEE ARTHROPLASTY Left 07/12/2016   Procedure: COMPUTER ASSISTED TOTAL KNEE ARTHROPLASTY;  Surgeon: Mardee Lynwood SQUIBB, MD;  Location: [facility] ORS;  Service: Orthopedics;  Laterality: Left;   Laryngeal tear after intubation w/repair     LUMBAR DISC SURGERY  12/2009   REPLACEMENT TOTAL KNEE Right 06/2007   DR. HOOTEN, [facility]   REPLACEMENT UNICONDYLAR  JOINT KNEE     DR. HOOTEN, [facility]   RIGHT/LEFT HEART CATH AND CORONARY ANGIOGRAPHY N/A 09/29/2022   Procedure: RIGHT/LEFT HEART CATH AND CORONARY ANGIOGRAPHY;  Surgeon: Darron Deatrice LABOR, MD;  Location: [facility] INVASIVE CV LAB;  Service: Cardiovascular;  Laterality: N/A;   SHOULDER ARTHROSCOPY WITH ROTATOR CUFF REPAIR Right    TONSILLECTOMY     TOTAL HIP ARTHROPLASTY Right 01/31/2017   Procedure: TOTAL HIP ARTHROPLASTY;  Surgeon: Mardee Lynwood SQUIBB, MD;  Location: [facility] ORS;  Service: Orthopedics;  Laterality: Right;   TUBAL LIGATION     UPPER  GI ENDOSCOPY     VENTRAL HERNIA REPAIR N/A 09/04/2023   Procedure: REPAIR, HERNIA, VENTRAL;  Surgeon: Marinda Jayson KIDD, MD;  Location: [facility] ORS;  Service: General;  Laterality: N/A;    Social History   Socioeconomic History   Marital status: Widowed    Spouse name: Arley   Number of children: 3   Years of education: Not on file   Highest education level: Not on file  Occupational History   Not on file  Tobacco Use   Smoking status: Never   Smokeless tobacco: Never  Vaping Use   Vaping status: Never Used  Substance and Sexual Activity   Alcohol use: No   Drug use: No   Sexual activity: Not on file  Other Topics Concern   Not on file  Social History Narrative   Not on file   Social Drivers of Health   Financial Resource Strain: Low Risk  (10/30/2023)   Received from [facility]   Overall Financial Resource Strain (CARDIA)    Difficulty of Paying Living Expenses: Not hard at all  Food Insecurity: No Food Insecurity (10/30/2023)   Received from [facility]   Hunger Vital Sign    Within the past 12 months, you worried that your food would run out before you got the money to buy more.: Never true    Within the past 12 months, the food you bought just didn't last and you didn't have money to get more.: Never true  Transportation Needs: No Transportation Needs (10/30/2023)   Received from [facility] - Transportation    In the past 12 months, has lack of transportation kept you from medical appointments or from getting medications?: No    Lack of Transportation (Non-Medical): No  Physical Activity: Not on file  Stress: Not on file  Social Connections: Moderately Isolated (09/04/2023)   Social Connection and Isolation Panel    Frequency of Communication with Friends and Family: More than three times a week    Frequency of Social Gatherings with Friends and Family: More than three times a week    Attends Religious Services: More than 4 times per year    Active Member of [employer] or Organizations: No    Attends Banker Meetings: Never    Marital Status: Widowed  Intimate Partner Violence: Not At Risk (09/04/2023)   Humiliation, Afraid, Rape, and Kick questionnaire    Fear of Current or Ex-Partner: No    Emotionally Abused: No    Physically Abused: No    Sexually Abused: No    Family History  Problem Relation Age of Onset   Asthma Mother    Breast cancer Mother 69   Emphysema Father        smoked   Lung cancer Father        smoked   Coronary artery disease Father 35     Current Outpatient Medications:    acetaminophen  (TYLENOL ) 500 MG tablet, Take 1,000 mg by mouth 3 (three) times daily as needed for mild pain. , Disp: , Rfl:    albuterol  (VENTOLIN  HFA) 108 (90 Base) MCG/ACT inhaler, Inhale 2 puffs into the lungs every 4 (four) hours as needed., Disp: , Rfl:    budesonide -formoterol  (SYMBICORT ) 160-4.5 MCG/ACT inhaler, Inhale 2 puffs into the lungs daily., Disp: , Rfl:    Cholecalciferol  (VITAMIN D ) 2000 units tablet, Take 2,000 Units by mouth daily., Disp: , Rfl:    clopidogrel  (PLAVIX )  75 MG tablet, TAKE 1 TABLET BY MOUTH DAILY WITH BREAKFAST., Disp: 90 tablet, Rfl: 3   Cyanocobalamin 1000 MCG/ML KIT, Inject as directed every 30 (thirty) days., Disp: , Rfl:    dexamethasone  (DECADRON ) 4 MG tablet, Take by mouth., Disp: , Rfl:    diltiazem  (CARDIZEM   CD) 180 MG 24 hr capsule, Take 1 capsule (180 mg total) by mouth in the morning and at bedtime., Disp: 180 capsule, Rfl: 3   Dupilumab 300 MG/2ML SOPN, Inject 4 mLs into the skin every 14 (fourteen) days., Disp: , Rfl:    EPINEPHrine  0.3 mg/0.3 mL IJ SOAJ injection, Inject 0.3 mg into the muscle as needed., Disp: , Rfl:    famotidine  (PEPCID ) 20 MG tablet, Take 20 mg by mouth daily as needed., Disp: , Rfl:    Fluticasone -Umeclidin-Vilant 100-62.5-25 MCG/ACT AEPB, Inhale into the lungs. Inhale 1 Puff into the lungs once daily, Disp: , Rfl:    gabapentin  (NEURONTIN ) 300 MG capsule, Take 300 mg by mouth at bedtime., Disp: , Rfl:    levalbuterol  (XOPENEX ) 0.63 MG/3ML nebulizer solution, Take 0.63 mg by nebulization every 6 (six) hours as needed for wheezing., Disp: , Rfl:    meclizine (ANTIVERT) 12.5 MG tablet, PLEASE SEE ATTACHED FOR DETAILED DIRECTIONS, Disp: , Rfl:    montelukast  (SINGULAIR ) 10 MG tablet, Take 10 mg by mouth daily as needed (for allergies.). , Disp: , Rfl:    nystatin  (MYCOSTATIN ) 100000 UNIT/ML suspension, Take 5 mLs by mouth as needed., Disp: , Rfl:    nystatin  (MYCOSTATIN ) 100000 UNIT/ML suspension, Take 5 mLs (500,000 Units total) by mouth 4 (four) times daily., Disp: 60 mL, Rfl: 0   pantoprazole  (PROTONIX ) 40 MG tablet, Take 40 mg by mouth daily., Disp: , Rfl:    predniSONE  (DELTASONE ) 2.5 MG tablet, Take 2 tablets (5 mg total) by mouth daily with breakfast. Resume after finishing high doses of prednisone , Disp: , Rfl:    sertraline  (ZOLOFT ) 25 MG tablet, Take 50 mg by mouth daily. 50 mg total, Disp: , Rfl:    Simethicone  180 MG CAPS, Take 1-2 capsules by mouth as needed., Disp: , Rfl:    triamcinolone  (NASACORT ) 55 MCG/ACT AERO nasal inhaler, Place 2 sprays into the nose daily., Disp: , Rfl:    cephALEXin  (KEFLEX ) 500 MG capsule, Take 500 mg by mouth 2 (two) times daily. (Patient not taking: Reported on 11/14/2023), Disp: , Rfl:    leflunomide  (ARAVA ) 10 MG tablet, Take 10 mg  by mouth daily. (Patient not taking: Reported on 11/14/2023), Disp: , Rfl:   Physical exam:  Vitals:   11/14/23 1323 11/14/23 1328  BP: (!) 140/85 (!) 149/91  Pulse: 86   Resp: 16   Temp: (!) 96.5 F (35.8 C)   TempSrc: Tympanic   SpO2: 100%   Weight: 205 lb (93 kg)    Physical Exam Constitutional:      General: She is not in acute distress.    Comments: She ambulates independantly  HENT:     Head: Normocephalic and atraumatic.  Eyes:     General: No scleral icterus. Cardiovascular:     Rate and Rhythm: Normal rate.  Pulmonary:     Effort: Pulmonary effort is normal. No respiratory distress.     Breath sounds: No wheezing.     Comments: Decreased breath sounds bilaterally.  Abdominal:     General: There is no distension.     Palpations: Abdomen is soft.  Musculoskeletal:        General: Normal range  of motion.     Cervical back: Normal range of motion and neck supple.  Skin:    General: Skin is warm.  Neurological:     Mental Status: She is alert and oriented to person, place, and time. Mental status is at baseline.  Psychiatric:        Mood and Affect: Mood normal.        Latest Ref Rng & Units 09/06/2023    4:34 AM  CMP  Glucose 70 - 99 mg/dL 892   BUN 8 - 23 mg/dL 20   Creatinine 9.55 - 1.00 mg/dL 8.59   Sodium 864 - 854 mmol/L 138   Potassium 3.5 - 5.1 mmol/L 4.7   Chloride 98 - 111 mmol/L 102   CO2 22 - 32 mmol/L 27   Calcium  8.9 - 10.3 mg/dL 9.1       Latest Ref Rng & Units 11/08/2023    4:15 PM  CBC  WBC 4.0 - 10.5 K/uL 8.8   Hemoglobin 12.0 - 15.0 g/dL 88.4   Hematocrit 63.9 - 46.0 % 37.1   Platelets 150 - 400 K/uL 175     RADIOGRAPHIC STUDIES: I have personally reviewed the radiological images as listed and agreed with the findings in the report. CT SOFT TISSUE NECK W CONTRAST Result Date: 11/09/2023 CLINICAL DATA:  Possible tracheal stenosis.  Shortness of breath. Creatinine was obtained on site at [facility] at [address], [place], [place] Results: Creatinine 1.5 mg/dL. EXAM: CT NECK WITH CONTRAST TECHNIQUE: Multidetector CT imaging of the neck was performed using the standard protocol following the bolus administration of intravenous contrast. RADIATION DOSE REDUCTION: This exam was performed according to the departmental dose-optimization program which includes automated exposure control, adjustment of the mA and/or kV according to patient size and/or use of iterative reconstruction technique. CONTRAST:  50mL ISOVUE -300 IOPAMIDOL  (ISOVUE -300) INJECTION 61% COMPARISON:  Thyroid  ultrasound 09/28/2022. Cervical spine MRI 07/21/2010. FINDINGS: Pharynx and larynx: Asymmetry of the posterior nasopharyngeal soft tissues is attributed to distortion by bulky right-sided ventral osteophytes at C1-2. No discrete mass is identified. The glottis is closed. The airway is widely patent above and below, and specifically no significant tracheal stenosis is evident. No parapharyngeal retropharyngeal fluid collection or inflammation. Salivary glands: No inflammation, mass, or stone. Thyroid : Left thyroid  nodules measuring up to approximately 2.5 cm which were previously evaluated by ultrasound. Lymph nodes: No enlarged or suspicious lymph nodes in the neck. Vascular: Mild atherosclerotic calcification about the carotid bifurcations. 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 Visualized orbits: Bilateral cataract extraction. Mastoids and visualized paranasal sinuses: Clear. 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 Asymmetrically advanced left atlantooccipital and left C2-3 facet arthropathy. C3-C5 ACDF and C3-C6 posterior fusion. Congenital C5-C7 fusion. Advanced disc and left-sided facet degeneration at C7-T1. Upper chest: No apical lung mass or consolidation. Other: None. IMPRESSION: No evidence of tracheal stenosis, neck mass, or acute  abnormality. Electronically Signed   By: Dasie Hamburg M.D.   On: 11/09/2023 14:37

## 2023-11-20 ENCOUNTER — Other Ambulatory Visit
Admission: RE | Admit: 2023-11-20 | Discharge: 2023-11-20 | Disposition: A | Discharge status: Home / Self Care | Source: Ambulatory Visit | Attending: Emergency Medicine | Admitting: Emergency Medicine

## 2023-11-20 DIAGNOSIS — R053 Chronic cough: Secondary | ICD-10-CM | POA: Diagnosis present

## 2023-11-20 DIAGNOSIS — Z86711 Personal history of pulmonary embolism: Secondary | ICD-10-CM | POA: Insufficient documentation

## 2023-11-20 DIAGNOSIS — R0602 Shortness of breath: Secondary | ICD-10-CM | POA: Insufficient documentation

## 2023-11-20 LAB — D-DIMER, QUANTITATIVE: D-Dimer, Quant: 2.12 ug{FEU}/mL — ABNORMAL HIGH (ref 0.00–0.50)

## 2023-11-21 ENCOUNTER — Other Ambulatory Visit: Payer: Self-pay | Admitting: Emergency Medicine

## 2023-11-21 ENCOUNTER — Ambulatory Visit
Admission: RE | Admit: 2023-11-21 | Discharge: 2023-11-21 | Disposition: A | Discharge status: Home / Self Care | Source: Ambulatory Visit | Attending: Emergency Medicine | Admitting: Emergency Medicine

## 2023-11-21 DIAGNOSIS — R590 Localized enlarged lymph nodes: Secondary | ICD-10-CM

## 2023-11-21 DIAGNOSIS — R0602 Shortness of breath: Secondary | ICD-10-CM

## 2023-11-21 DIAGNOSIS — Z86711 Personal history of pulmonary embolism: Secondary | ICD-10-CM | POA: Insufficient documentation

## 2023-11-21 DIAGNOSIS — R7989 Other specified abnormal findings of blood chemistry: Secondary | ICD-10-CM | POA: Diagnosis present

## 2023-11-21 MED ORDER — IOHEXOL 350 MG/ML SOLN
75.0000 mL | Freq: Once | INTRAVENOUS | Status: AC | PRN
  Administered 2023-11-21: 75 mL via INTRAVENOUS

## 2023-11-22 ENCOUNTER — Ambulatory Visit (INDEPENDENT_AMBULATORY_CARE_PROVIDER_SITE_OTHER): Admitting: General Surgery

## 2023-11-22 ENCOUNTER — Encounter: Payer: Self-pay | Admitting: General Surgery

## 2023-11-22 VITALS — BP 150/69 | HR 78 | Temp 97.9°F | Ht 64.0 in | Wt 198.0 lb

## 2023-11-22 DIAGNOSIS — Z09 Encounter for follow-up examination after completed treatment for conditions other than malignant neoplasm: Secondary | ICD-10-CM | POA: Diagnosis not present

## 2023-11-22 DIAGNOSIS — K430 Incisional hernia with obstruction, without gangrene: Secondary | ICD-10-CM

## 2023-11-22 DIAGNOSIS — K436 Other and unspecified ventral hernia with obstruction, without gangrene: Secondary | ICD-10-CM

## 2023-11-22 NOTE — Patient Instructions (Signed)
 Laparoscopic Surgery for Belly Hernias: What to Know After After the procedure, it's common to have pain, discomfort, or soreness. Follow these instructions at home: Medicines Take your medicines only as told. You may need to take steps to help treat or prevent trouble pooping (constipation), such as: Taking medicines to help you poop. Eating foods high in fiber, like beans, whole grains, and fresh fruits and vegetables. Drinking more fluids as told. Ask your health care provider if it's safe to drive or use machines while taking your medicine. Incision care  Take care of the cuts in your belly as told. Make sure you: Wash your hands with soap and water for at least 20 seconds before and after you change your bandage. If you can't use soap and water, use hand sanitizer. Change your bandage. Leave stitches or skin glue alone. Leave tape strips alone unless you're told to take them off. You may trim the edges of the tape strips if they curl up. Check the cuts on your belly every day for signs of infection. Check for: More redness, swelling, or pain. More fluid or blood. Warmth. Pus or a bad smell. Activity Rest as told. Get up to take short walks at least every 2 hours during the day. This helps you breathe better and keeps your blood flowing. Ask for help if you feel weak or unsteady. Do not take baths, swim, or use a hot tub until you're told it's OK. Ask if you can shower. Ask if it's OK for you to lift. If you were given a sedative, do not drive or use machines until you're told it's safe. A sedative can make you sleepy. Ask what things are safe for you to do at home. Ask when you can go back to work or school. General instructions Hold a pillow over your belly when you cough or sneeze. This helps with pain. Wear a binder around your belly as told by your provider. Do not smoke, vape, or use nicotine or tobacco. Wear compression stockings to reduce swelling and help prevent blood  clots in your legs. You may be asked to continue to do deep breathing exercises at home. This will help to prevent a lung infection. Contact a health care provider if: You have any signs of infection. You have pain that gets worse or does not get better with medicine. You throw up or you feel like throwing up. You have a cough. You have not pooped in 3 days. You are not able to pee. You have a fever. Get help right away if: You have very bad pain in your belly. You throw up every time you eat or drink. You have redness, warmth, or pain in your leg. You have chest pain. You have trouble breathing. These symptoms may be an emergency. Call 911 right away. Do not wait to see if the symptoms will go away. Do not drive yourself to the hospital. This information is not intended to replace advice given to you by your health care provider. Make sure you discuss any questions you have with your health care provider. Document Revised: 07/11/2022 Document Reviewed: 07/11/2022 Elsevier Patient Education  2024 [employer].

## 2023-11-28 ENCOUNTER — Encounter: Admitting: Internal Medicine

## 2023-11-28 NOTE — Progress Notes (Signed)
 Outpatient Surgical Follow Up    Courtney Wilcox is an 78 y.o. female.   Chief Complaint  Patient presents with   Routine Post Op    Ventral hernia repair 09/04/2023    HPI: Patient returns today s/p ventral hernia repair. From a surgical perspective she is doing well. She denies any abdominal bulge, her incision has healed in well. She denies any drainage from the midline or pain in her abdomen.   She continues to have trouble breathing and is seeing pulmonology as well as cardiology to elucidate the etiology of this.   Past Medical History:  Diagnosis Date   Allergic rhinitis    Anxiety    Arthritis    Asthma    Atrophic vaginitis    B12 deficiency    Bronchitis, chronic (HCC)    CAD (coronary artery disease)    a. 03/2003 Cath: Nl cors; b. 09/2022 Staged PCI: LM nl, LAD 85p/53m (3.5x26 Onyx Frontier DES), RI small, mild dzs, LCX nl, OM2 mild dzs, RCA 30p.   Cervical disc disease    CKD (chronic kidney disease), stage III (HCC)    Collagen vascular disease    RA   Cough - post-COVID    Depression    Diastolic dysfunction    a. 11/2020 Echo: EF 45-50%, GrI DD; b. 10/2021 Echo: EF 60-65%, GrII DD; c. 09/2022 Echo: EF 55-60%, no rwma, GrI DD, nl RV fxn, mildly dil LA. No significant valvular dzs.   Fibrocystic breast disease    Foot ulcer (HCC)    GERD (gastroesophageal reflux disease)    Hammer toe    Hyperlipidemia    IBS (irritable bowel syndrome)    Macrocytic anemia    Macular degeneration 2023   Obesity    OSA (obstructive sleep apnea)    Osteoarthrosis    Multi site   Osteopenia    PSVT (paroxysmal supraventricular tachycardia)    a. 04/2022 [facility]: Sinus rhythm @ 57 (42-101). Rare PACs/PVCs. 74290 SVT runs lasting up to 35m 12sec - max rate 222. 5 beats NSVT. Triggered events = sinus, SVT, PACs.   Rheumatoid arthritis (HCC)    Shingles    Thyroid  nodule    MULTIPLE   Tonsillitis    recurrent   Umbilical hernia     Past Surgical History:  Procedure  Laterality Date   BACK SURGERY  2011   LAMINECTOMY   BREAST BIOPSY Right    neg   CERVICAL FUSION  2011, 2012   X 2   COLONOSCOPY  2005, 2015   COLONOSCOPY WITH PROPOFOL  N/A 08/04/2020   Procedure: COLONOSCOPY WITH PROPOFOL ;  Surgeon: Dessa Reyes ORN, MD;  Location: [facility];  Service: Endoscopy;  Laterality: N/A;   CORONARY STENT INTERVENTION N/A 10/02/2022   Procedure: CORONARY STENT INTERVENTION;  Surgeon: Darron Deatrice LABOR, MD;  Location: [facility];  Service: Cardiovascular;  Laterality: N/A;   ESOPHAGOGASTRODUODENOSCOPY (EGD) WITH PROPOFOL  N/A 09/26/2021   Procedure: ESOPHAGOGASTRODUODENOSCOPY (EGD) WITH PROPOFOL ;  Surgeon: Onita Elspeth Sharper, DO;  Location: [facility] ENDOSCOPY;  Service: Gastroenterology;  Laterality: N/A;   ESOPHAGOGASTRODUODENOSCOPY ENDOSCOPY  2005, 2013, 2015   EYE SURGERY Bilateral    Cataract Extraction with IOL    HERNIA REPAIR     INSERTION OF MESH N/A 09/04/2023   Procedure: INSERTION OF MESH;  Surgeon: Marinda Jayson KIDD, MD;  Location: [facility];  Service: General;  Laterality: N/A;   JOINT REPLACEMENT     KNEE ARTHROPLASTY Left 07/12/2016   Procedure: COMPUTER ASSISTED  TOTAL KNEE ARTHROPLASTY;  Surgeon: Mardee Lynwood SQUIBB, MD;  Location: [facility];  Service: Orthopedics;  Laterality: Left;   Laryngeal tear after intubation w/repair     LUMBAR DISC SURGERY  12/2009   REPLACEMENT TOTAL KNEE Right 06/2007   DR. HOOTEN, [facility]   REPLACEMENT UNICONDYLAR JOINT KNEE     DR. HOOTEN, [facility]   RIGHT/LEFT HEART CATH AND CORONARY ANGIOGRAPHY N/A 09/29/2022   Procedure: RIGHT/LEFT HEART CATH AND CORONARY ANGIOGRAPHY;  Surgeon: Darron Deatrice LABOR, MD;  Location: [facility];  Service: Cardiovascular;  Laterality: N/A;   SHOULDER ARTHROSCOPY WITH ROTATOR CUFF REPAIR Right    TONSILLECTOMY     TOTAL HIP ARTHROPLASTY Right 01/31/2017   Procedure: TOTAL HIP ARTHROPLASTY;  Surgeon: Mardee Lynwood SQUIBB, MD;  Location: [facility];  Service: Orthopedics;  Laterality:  Right;   TUBAL LIGATION     UPPER GI ENDOSCOPY     VENTRAL HERNIA REPAIR N/A 09/04/2023   Procedure: REPAIR, HERNIA, VENTRAL;  Surgeon: Marinda Jayson KIDD, MD;  Location: [facility];  Service: General;  Laterality: N/A;    Family History  Problem Relation Age of Onset   Asthma Mother    Breast cancer Mother 6   Emphysema Father        smoked   Lung cancer Father        smoked   Coronary artery disease Father 66    Social History:  reports that she has never smoked. She has never used smokeless tobacco. She reports that she does not drink alcohol and does not use drugs.  Allergies:  Allergies  Allergen Reactions   Amiodarone  Other (See Comments)    Fatigue, Nausea, anorexia, malaise   Lactose Other (See Comments) and Diarrhea   Hydrocodone -Acetaminophen  Itching   Other Other (See Comments)    Pt ONLY tolerates SLOW-FE (slow released iron )---upsets IBS   Biaxin [Clarithromycin] Other (See Comments)    GI upset    Hydrocodone -Acetaminophen  Itching    Tolerates acetaminophen     Oxycodone  Itching   Sulfa Antibiotics Diarrhea and Other (See Comments)    GI upset   Sulfasalazine Diarrhea    Medications reviewed.    ROS Full ROS performed and is otherwise negative other than what is stated in HPI   BP (!) 150/69   Pulse 78   Temp 97.9 F (36.6 C) (Oral)   Ht 5' 4 (1.626 m)   Wt 198 lb (89.8 kg)   SpO2 96%   BMI 33.99 kg/m   Physical Exam  Midline scar is healing well.  There is no opening of the wound.  There is no surrounding erythema and minimal pain upon palpation.  There is no evidence of recurrence of ventral hernia   No results found for this or any previous visit (from the past 48 hours). No results found.  Assessment/Plan:  Patient status post ventral hernia repair.  Did have small dehiscence but this is healing well.  No evidence of recurrence.  She can follow-up with us  as needed.  Jayson Marinda, M.D. [facility]\

## 2023-11-30 ENCOUNTER — Encounter: Payer: Self-pay | Admitting: Nurse Practitioner

## 2023-11-30 ENCOUNTER — Other Ambulatory Visit
Admission: RE | Admit: 2023-11-30 | Discharge: 2023-11-30 | Disposition: A | Discharge status: Home / Self Care | Source: Ambulatory Visit | Attending: Nurse Practitioner | Admitting: Nurse Practitioner

## 2023-11-30 ENCOUNTER — Ambulatory Visit: Payer: Self-pay | Admitting: Nurse Practitioner

## 2023-11-30 ENCOUNTER — Ambulatory Visit: Attending: Nurse Practitioner | Admitting: Nurse Practitioner

## 2023-11-30 VITALS — BP 124/80 | HR 109 | Ht 65.0 in | Wt 198.1 lb

## 2023-11-30 DIAGNOSIS — I2584 Coronary atherosclerosis due to calcified coronary lesion: Secondary | ICD-10-CM | POA: Insufficient documentation

## 2023-11-30 DIAGNOSIS — E785 Hyperlipidemia, unspecified: Secondary | ICD-10-CM | POA: Insufficient documentation

## 2023-11-30 DIAGNOSIS — I471 Supraventricular tachycardia, unspecified: Secondary | ICD-10-CM | POA: Diagnosis present

## 2023-11-30 DIAGNOSIS — I2510 Atherosclerotic heart disease of native coronary artery without angina pectoris: Secondary | ICD-10-CM | POA: Diagnosis present

## 2023-11-30 LAB — BASIC METABOLIC PANEL WITH GFR
Anion gap: 11 (ref 5–15)
BUN: 37 mg/dL — ABNORMAL HIGH (ref 8–23)
CO2: 27 mmol/L (ref 22–32)
Calcium: 9.2 mg/dL (ref 8.9–10.3)
Chloride: 104 mmol/L (ref 98–111)
Creatinine, Ser: 1.45 mg/dL — ABNORMAL HIGH (ref 0.44–1.00)
GFR, Estimated: 37 mL/min — ABNORMAL LOW (ref >60)
Glucose, Bld: 97 mg/dL (ref 70–99)
Potassium: 4.4 mmol/L (ref 3.5–5.1)
Sodium: 142 mmol/L (ref 135–145)

## 2023-11-30 LAB — CBC
HCT: 41.6 % (ref 36.0–46.0)
Hemoglobin: 13.6 g/dL (ref 12.0–15.0)
MCH: 33 pg (ref 26.0–34.0)
MCHC: 32.7 g/dL (ref 30.0–36.0)
MCV: 101 fL — ABNORMAL HIGH (ref 80.0–100.0)
Platelets: 163 K/uL (ref 150–400)
RBC: 4.12 MIL/uL (ref 3.87–5.11)
RDW: 14.2 % (ref 11.5–15.5)
WBC: 16.3 K/uL — ABNORMAL HIGH (ref 4.0–10.5)
nRBC: 0 % (ref 0.0–0.2)

## 2023-11-30 NOTE — Progress Notes (Signed)
 Office Visit    Patient Name: Courtney Wilcox Date of Encounter: 11/30/2023  Primary Care Provider:  Marikay Eva POUR, PA Primary Cardiologist:  Lonni Hanson, MD  Electrophysiologist:  OLE ONEIDA HOLTS, MD   Chief Complaint    78 y.o. female  with a history of PSVT, PE (November 2022), diastolic dysfunction, hyperlipidemia, stage III chronic kidney disease, asthma w/ post-COVID cough, B12 deficiency macrocytic anemia, depression, anxiety and rheumatoid arthritis, who presents for f/u related to dyspnea.   Past Medical History   Subjective   Past Medical History:  Diagnosis Date   Allergic rhinitis    Anxiety    Arthritis    Asthma    Atrophic vaginitis    B12 deficiency    Bronchitis, chronic (HCC)    CAD (coronary artery disease)    a. 03/2003 Cath: Nl cors; b. 09/2022 Staged PCI: LM nl, LAD 85p/34m (3.5x26 Onyx Frontier DES), RI small, mild dzs, LCX nl, OM2 mild dzs, RCA 30p.   Cervical disc disease    CKD (chronic kidney disease), stage III (HCC)    Collagen vascular disease    RA   Cough - post-COVID    Depression    Diastolic dysfunction    a. 11/2020 Echo: EF 45-50%, GrI DD; b. 10/2021 Echo: EF 60-65%, GrII DD; c. 09/2022 Echo: EF 55-60%, no rwma, GrI DD, nl RV fxn, mildly dil LA. No significant valvular dzs.   Fibrocystic breast disease    Foot ulcer (HCC)    GERD (gastroesophageal reflux disease)    Hammer toe    Hyperlipidemia    IBS (irritable bowel syndrome)    Macrocytic anemia    Macular degeneration 2023   Obesity    OSA (obstructive sleep apnea)    Osteoarthrosis    Multi site   Osteopenia    PSVT (paroxysmal supraventricular tachycardia)    a. 04/2022 Zio: Sinus rhythm @ 57 (42-101). Rare PACs/PVCs. 74290 SVT runs lasting up to 1m 12sec - max rate 222. 5 beats NSVT. Triggered events = sinus, SVT, PACs.   Rheumatoid arthritis (HCC)    Shingles    Thyroid  nodule    MULTIPLE   Tonsillitis    recurrent   Umbilical hernia    Past  Surgical History:  Procedure Laterality Date   BACK SURGERY  2011   LAMINECTOMY   BREAST BIOPSY Right    neg   CERVICAL FUSION  2011, 2012   X 2   COLONOSCOPY  2005, 2015   COLONOSCOPY WITH PROPOFOL  N/A 08/04/2020   Procedure: COLONOSCOPY WITH PROPOFOL ;  Surgeon: Dessa Reyes ORN, MD;  Location: [facility];  Service: Endoscopy;  Laterality: N/A;   CORONARY STENT INTERVENTION N/A 10/02/2022   Procedure: CORONARY STENT INTERVENTION;  Surgeon: Darron Deatrice LABOR, MD;  Location: [facility];  Service: Cardiovascular;  Laterality: N/A;   ESOPHAGOGASTRODUODENOSCOPY (EGD) WITH PROPOFOL  N/A 09/26/2021   Procedure: ESOPHAGOGASTRODUODENOSCOPY (EGD) WITH PROPOFOL ;  Surgeon: Onita Elspeth Sharper, DO;  Location: [facility];  Service: Gastroenterology;  Laterality: N/A;   ESOPHAGOGASTRODUODENOSCOPY ENDOSCOPY  2005, 2013, 2015   EYE SURGERY Bilateral    Cataract Extraction with IOL    HERNIA REPAIR     INSERTION OF MESH N/A 09/04/2023   Procedure: INSERTION OF MESH;  Surgeon: Marinda Jayson KIDD, MD;  Location: [facility];  Service: General;  Laterality: N/A;   JOINT REPLACEMENT     KNEE ARTHROPLASTY Left 07/12/2016   Procedure: COMPUTER ASSISTED TOTAL KNEE ARTHROPLASTY;  Surgeon: Mardee Agent  P, MD;  Location: [facility];  Service: Orthopedics;  Laterality: Left;   Laryngeal tear after intubation w/repair     LUMBAR DISC SURGERY  12/2009   REPLACEMENT TOTAL KNEE Right 06/2007   DR. HOOTEN, [facility]   REPLACEMENT UNICONDYLAR JOINT KNEE     DR. HOOTEN, [facility]   RIGHT/LEFT HEART CATH AND CORONARY ANGIOGRAPHY N/A 09/29/2022   Procedure: RIGHT/LEFT HEART CATH AND CORONARY ANGIOGRAPHY;  Surgeon: Darron Deatrice LABOR, MD;  Location: [facility];  Service: Cardiovascular;  Laterality: N/A;   SHOULDER ARTHROSCOPY WITH ROTATOR CUFF REPAIR Right    TONSILLECTOMY     TOTAL HIP ARTHROPLASTY Right 01/31/2017   Procedure: TOTAL HIP ARTHROPLASTY;  Surgeon: Mardee Lynwood SQUIBB, MD;  Location: [facility];   Service: Orthopedics;  Laterality: Right;   TUBAL LIGATION     UPPER GI ENDOSCOPY     VENTRAL HERNIA REPAIR N/A 09/04/2023   Procedure: REPAIR, HERNIA, VENTRAL;  Surgeon: Marinda Jayson KIDD, MD;  Location: [facility];  Service: General;  Laterality: N/A;    Allergies  Allergies  Allergen Reactions   Amiodarone  Other (See Comments)    Fatigue, Nausea, anorexia, malaise   Lactose Other (See Comments) and Diarrhea   Hydrocodone -Acetaminophen  Itching   Other Other (See Comments)    Pt ONLY tolerates SLOW-FE (slow released iron )---upsets IBS   Biaxin [Clarithromycin] Other (See Comments)    GI upset    Hydrocodone -Acetaminophen  Itching    Tolerates acetaminophen     Oxycodone  Itching   Sulfa Antibiotics Diarrhea and Other (See Comments)    GI upset   Sulfasalazine Diarrhea       History of Present Illness     78 y.o. y/o female with a history of, PSVT, PE (November 2022), diastolic dysfunction, hyperlipidemia stage III chronic kidney disease, asthma w/ post-COVID cough, B12 deficiency macrocytic anemia, depression, anxiety and rheumatoid arthritis.  She previously underwent diagnostic catheterization 2005, which showed normal coronary arteries.  More recently, she was seen by Dr. Cindie in June 2023 in the setting of PSVT, which was felt to be atrial tachycardia.  She was switched from short-acting to long-acting metoprolol  with subsequent plan to place a loop monitor to assess overall SVT burden.  This was subsequently deferred due to perceived low burden.  Echo in October 2023 showed EF 60 to 65% with grade 2 diastolic dysfunction and mild to moderate mitral regurgitation.  She has also been followed by [facility] pulmonology in the setting of persistent dyspnea, asthma, post-COVID cough, and history of PE on oral anticoagulation.  [name] established general cardiology care with Dr. Mady in January 2024.  At that time, she was having ongoing dyspnea and intermittent chest discomfort.  It  was felt that if her symptoms didn't improve following pulmonology medical interventions, or iron  infusions in the setting of ongoing anemia, diagnostic catheterization may become appropriate.  At April 2024 follow-up, she was noted to have recurrent SVT.  14-day ZIO monitor was placed and subsequently showed 25,700 SVT runs lasting up to 40 minutes at a maximum rate of 222 bpm.  Average resting rate was 57.  She was reevaluated by electrophysiology and switched from Toprol -XL to diltiazem , which was subsequently titrated to 180 mg daily due to ongoing tachycardia, w/o significant improvement in overall burden when compared to toprol  xl therapy.  At September 27, 2022 cardiology follow-up, Ms. Goeken c/o or dyspnea substernal chest tightness.  She was referred to the emergency department due to active chest pain, and ruled out for MI.  She underwent diagnostic catheterization on September 13, which showed severe proximal LAD disease.  She had frequent runs of SVT during the procedure, which made angiography difficult and she was placed on amiodarone  and subsequently underwent staged PCI drug-eluting stent placement of the proximal and mid LAD on September 16.  Following initiation of amiodarone , she had significant improvement in SVT burden and she was discharged home on oral amiodarone  therapy as well as dual antiplatelet therapy.  Eliquis  was not resumed as it had been greater than 12 months since PE.  Unfortunately, she had significant nausea, anorexia, and lethargy on amio, and this was d/c'd @ October 2024 f/u visit, w/ resolution of GI symptoms but more frequent palpitations and elevated heart rates at home in the absence of sustained arrhythmias.  She was seen by EP and is not felt to be a candidate for a 1C agent or tikosyn due to CAD and CKD respectively.    In April of this year, she was admitted with asthma exacerbation and demand ischemia with elevated troponin.  Echo was performed and showed an EF  of 60 to 65% with mild LVH, normal RV function, RVSP 23.6 mmHg, and aortic sclerosis.  Troponin elevation was felt to be secondary to demand ischemia in the setting of tachycardia and asthma exacerbation.  No further ischemic evaluation was warranted.  In most recent EP follow-up in June 2025, she was changed to diltiazem  180 mg twice daily for better 24-hour coverage.   In August 2025, she underwent hernia repair with a relatively uneventful postoperative course. In September, she started having worsening dyspnea that pulmonary initially worked up for exacerbation of asthma or allergic reaction. She was given Solu-Medrol  IM and prednisone  taper. She was seen by us  10/2 with continued dyspnea/wheezing as well as upper airway tightness and choking sensation. She was not experiencing chest pain or tightness at that time, so we held off on ischemic evaluation pending steroid response.   She was subsequently seen by pulmonary 11/20/2023 with continued worsening dyspnea. D-dimer was ordered 11/20/2023 resulting in 2.12. CTA chest 11/21/2023, showed mild cardiomegaly. No PE, and coronary vascular calcification.  There was mild dilation of the main pulmonary trunk suggestive of pulmonary HTN. CXR also ordered showing no consolidation, infiltrate, effusion or edema with enlarged cardiac silhouette, otherwise unremarkable.    Ms. Buch presents today with worsening shortness of breath, significantly limiting her activity. She continues to have choking/upper airway tightness that was relieved slightly by steroids. In addition to her exertional dyspnea, she has also started to experience mild retrosternal chest pressure and heaviness.  Symptoms improve with rest after few minutes.  Her activity has become so limited she is now requiring a wheelchair. Her daughter is present and is also concerned about her heart rate being more elevated than normal. Ms. Bugbee states that she has very infrequent feelings of palpitations.   She denies n, v, dizziness, syncope, edema, weight gain, or early satiety.  She was seen by pulmonology yesterday with referral back to us  for right heart catheterization.   Objective   Home Medications    Current Outpatient Medications  Medication Sig Dispense Refill   acetaminophen  (TYLENOL ) 500 MG tablet Take 1,000 mg by mouth 3 (three) times daily as needed for mild pain.      albuterol  (VENTOLIN  HFA) 108 (90 Base) MCG/ACT inhaler Inhale 2 puffs into the lungs every 4 (four) hours as needed.     budesonide -formoterol  (SYMBICORT ) 160-4.5 MCG/ACT inhaler Inhale 2 puffs into the lungs  daily.     Cholecalciferol  (VITAMIN D ) 2000 units tablet Take 2,000 Units by mouth daily.     clopidogrel  (PLAVIX ) 75 MG tablet TAKE 1 TABLET BY MOUTH DAILY WITH BREAKFAST. 90 tablet 3   Cyanocobalamin 1000 MCG/ML KIT Inject as directed every 30 (thirty) days.     dexamethasone  (DECADRON ) 4 MG tablet Take by mouth.     diltiazem  (CARDIZEM  CD) 180 MG 24 hr capsule Take 1 capsule (180 mg total) by mouth in the morning and at bedtime. 180 capsule 3   Dupilumab 300 MG/2ML SOPN Inject 4 mLs into the skin every 14 (fourteen) days.     EPINEPHrine  0.3 mg/0.3 mL IJ SOAJ injection Inject 0.3 mg into the muscle as needed.     famotidine  (PEPCID ) 20 MG tablet Take 20 mg by mouth daily as needed.     fluconazole (DIFLUCAN) 150 MG tablet Take 150 mg by mouth once.     gabapentin  (NEURONTIN ) 300 MG capsule Take 300 mg by mouth at bedtime.     ipratropium (ATROVENT HFA) 17 MCG/ACT inhaler Inhale 2 puffs into the lungs 4 (four) times daily as needed.     ipratropium (ATROVENT) 0.03 % nasal spray Place 1 spray into the nose as needed.     levalbuterol  (XOPENEX ) 0.63 MG/3ML nebulizer solution Take 0.63 mg by nebulization every 6 (six) hours as needed for wheezing.     meclizine (ANTIVERT) 12.5 MG tablet PLEASE SEE ATTACHED FOR DETAILED DIRECTIONS     montelukast  (SINGULAIR ) 10 MG tablet Take 10 mg by mouth daily as needed (for  allergies.).      nystatin  (MYCOSTATIN ) 100000 UNIT/ML suspension Take 5 mLs by mouth as needed.     pantoprazole  (PROTONIX ) 40 MG tablet Take 40 mg by mouth daily.     sertraline  (ZOLOFT ) 25 MG tablet Take 50 mg by mouth daily. 50 mg total     Simethicone  180 MG CAPS Take 1-2 capsules by mouth as needed.     triamcinolone  (NASACORT ) 55 MCG/ACT AERO nasal inhaler Place 2 sprays into the nose daily.     Fluticasone -Umeclidin-Vilant 100-62.5-25 MCG/ACT AEPB Inhale into the lungs. Inhale 1 Puff into the lungs once daily (Patient not taking: Reported on 11/30/2023)     leflunomide  (ARAVA ) 10 MG tablet Take 10 mg by mouth daily. (Patient not taking: Reported on 11/30/2023)     nystatin  (MYCOSTATIN ) 100000 UNIT/ML suspension Take 5 mLs (500,000 Units total) by mouth 4 (four) times daily. (Patient not taking: Reported on 11/30/2023) 60 mL 0   predniSONE  (DELTASONE ) 2.5 MG tablet Take 2 tablets (5 mg total) by mouth daily with breakfast. Resume after finishing high doses of prednisone  (Patient not taking: Reported on 11/30/2023)     No current facility-administered medications for this visit.     Family History   Family History  Problem Relation Age of Onset   Asthma Mother    Breast cancer Mother 63   Emphysema Father        smoked   Lung cancer Father        smoked   Coronary artery disease Father 29      Social History   Social History   Socioeconomic History   Marital status: Widowed    Spouse name: Arley   Number of children: 3   Years of education: Not on file   Highest education level: Not on file  Occupational History   Not on file  Tobacco Use   Smoking status: Never   Smokeless  tobacco: Never  Vaping Use   Vaping status: Never Used  Substance and Sexual Activity   Alcohol use: No   Drug use: No   Sexual activity: Not on file  Other Topics Concern   Not on file  Social History Narrative   Not on file   Social Drivers of Health   Financial Resource Strain: Low  Risk  (10/30/2023)   Received from [facility]   Overall Financial Resource Strain (CARDIA)    Difficulty of Paying Living Expenses: Not hard at all  Food Insecurity: No Food Insecurity (10/30/2023)   Received from [facility]   Hunger Vital Sign    Within the past 12 months, you worried that your food would run out before you got the money to buy more.: Never true    Within the past 12 months, the food you bought just didn't last and you didn't have money to get more.: Never true  Transportation Needs: No Transportation Needs (10/30/2023)   Received from [facility] - Transportation    In the past 12 months, has lack of transportation kept you from medical appointments or from getting medications?: No    Lack of Transportation (Non-Medical): No  Physical Activity: Not on file  Stress: Not on file  Social Connections: Moderately Isolated (09/04/2023)   Social Connection and Isolation Panel    Frequency of Communication with Friends and Family: More than three times a week    Frequency of Social Gatherings with Friends and Family: More than three times a week    Attends Religious Services: More than 4 times per year    Active Member of [employer] or Organizations: No    Attends Banker Meetings: Never    Marital Status: Widowed  Intimate Partner Violence: Not At Risk (09/04/2023)   Humiliation, Afraid, Rape, and Kick questionnaire    Fear of Current or Ex-Partner: No    Emotionally Abused: No    Physically Abused: No    Sexually Abused: No     Review of Systems   +++ DOE w/ wheezing and upper airway tightness and occasional stridor.  +++ exertional chest heaviness.  +++ tachypalpitations w/ activity/dyspnea.  She denies pnd, orthopnea, n, v, dizziness, syncope, edema, weight gain, or early satiety.   Physical Exam    VS:  BP 124/80 (BP Location: Right Wrist, Patient Position: Sitting, Cuff Size: Large)   Pulse  (!) 109   Ht 5' 5 (1.651 m)   Wt 198 lb 2 oz (89.9 kg)   SpO2 99%   BMI 32.97 kg/m  , BMI Body mass index is 32.97 kg/m.          GEN: Well nourished, well developed, in no acute distress. HEENT: normal. Neck: Supple, no JVD, carotid bruits, or masses. Cardiac: Irreg, freq ectopy, no murmurs, rubs, or gallops. No clubbing, cyanosis, edema.  Radials 2+/PT 2+ and equal bilaterally.  Respiratory:  Respirations regular and labored, diminished bases, faint exp wheezing throughout. GI: Soft, nontender, nondistended, BS + x 4. MS: no deformity or atrophy. Skin: warm and dry, no rash on exposed skin. Neuro:  Strength and sensation are intact. Psych: Normal affect.  Accessory Clinical Findings    ECG personally reviewed by me today - EKG Interpretation Date/Time:  Friday November 30 2023 15:28:28 EST Ventricular Rate:  109 PR Interval:  160 QRS Duration:  94 QT Interval:  328 QTC Calculation: 441 R Axis:   -27  Text Interpretation: Sinus tachycardia with frequent Premature atrial complexes Confirmed by Vivienne Bruckner [phone]) on 11/30/2023 3:32:07 PM   - no acute changes.  Lab Results  Component Value Date   WBC 8.8 11/08/2023   HGB 11.5 (L) 11/08/2023   HCT 37.1 11/08/2023   MCV 105.7 (H) 11/08/2023   PLT 175 11/08/2023   Lab Results  Component Value Date   CREATININE 1.40 (H) 09/06/2023   BUN 20 09/06/2023   NA 138 09/06/2023   K 4.7 09/06/2023   CL 102 09/06/2023   CO2 27 09/06/2023   Lab Results  Component Value Date   ALT 25 08/10/2023   AST 34 08/10/2023   ALKPHOS 93 08/10/2023   BILITOT 0.7 08/10/2023   Lab Results  Component Value Date   TSH 0.649 04/26/2023       Assessment & Plan    1.  Dyspnea on exertion/precordial chest pain: 3 month history of progressive dyspnea on exertion associated with upper airway tightness, choking sensation, stridor, and wheezing. Seen multiple times by pulmonology following Solu-medrol  and prednisone  taper she  continued to have dyspnea. D-dimer was ordered 11/20/2023 resulting in 2.12. CTA chest 11/21/2023, showed mild cardiomegaly. No PE, and coronary vascular calcification.  There was mild dilation of the main pulmonary trunk suggestive of pulmonary HTN. She is now experiencing chest tightness that worsens with activity/dyspnea. Previous echocardiogram earlier this year showed normal LV and RV function (RVSP 23.55mmHg). In the setting of ongoing worsening dyspnea and dilated pulmonary artery on CTA, pulmonology has requested a RHC.  As there is also a component of chest pressure, we also discussed left heart catheterization today and patient is willing to proceed.  CBC and basic metabolic panel today with plan for catheterization in the coming weeks with Dr. Mady.  Informed Consent   Shared Decision Making/Informed Consent The risks, including but not limited to, [bleeding or vascular complications (1 in 500), pneumothorax (1 in 1600), arrhythmia (1 in 1000) and death (1 in 5000)], benefits (diagnostic support and/or management of heart failure, pulmonary hypertension) and alternatives of a right heart catheterization were discussed in detail with Ms. Peth and she is willing to proceed. The risks [stroke (1 in 1000), death (1 in 1000), kidney failure [usually temporary] (1 in 500), bleeding (1 in 200), allergic reaction [possibly serious] (1 in 200)], benefits (diagnostic support and management of coronary artery disease) and alternatives of a cardiac catheterization were discussed in detail with Ms. Cressman and she is willing to proceed.      2. Coronary artery disease/Unstable angina: Status post PCI and DES to LAD in 2024. Demand ischemia in the setting of tachycardia and asthma and April 2025 with normal EF on echo at that time. As above, her worsening dyspnea is now accompanied by midsternal chest pressure reminiscent though not entirely in line w/ prior angina.  Plan on R & L heart cath.  Cont clopidogrel  and  ccb therapy.  No statin in setting of prior LFT abnormalities in May.  Can look to resume statin vs alternate cholesterol therapy post-cath.  3. PSVT/atrial tachycardia: >25k SVT episodes on monitoring in 2024, lasting up to 40 mins.  Seen by EP.  Did not tolerate amio.  Poor candidate for 1C agent or Tikosyn.  Follow-up ZIO monitor in May 2025 at 13,844 SVT episodes.  Subsequently seen by EP with change in diltiazem  CD to 180 mg twice daily instead of 240 mg daily. She has had some instances of palpitations, particularly w/ activity and  profound dyspnea. Previous ECGs showing NSR with frequent PACs. Today's ECG showing sinus tachycardia with frequent PACs.  Per her daughter, her heart rate has been trending higher recently.  Suspect this is compensatory in the setting of hypoxia. Continue diltizazem 180 mg BID.   4.  Hyperlipidemia: Off statin due to elevated LFTs while on statin therapy in the past.  03/05/2023 cholesterol total was 177 , LDL 72, and HDL 84. Given multiple other concerns today we did not discuss alternatives to statin therapy and will plan to discuss at follow-up visit.   5.  Stage IIIb chronic kidney disease: Creatinine 1.27 in August.  F/u today.   6.  Macrocytic anemia: Stable by H&H in October 2025.  Followed by hematology.  F/u pre-cath.   7.  Asthma:  See #1.  Major contributor to dyspnea Ss associated w/ wheezing and sometimes stridor. Symptoms intermittently better w/ steroids however recent changes to inhaler therapy have not seemed to make a significant difference.  Mild wheezing on examination today.  Followed very closely by pulmonology.    8.  Disposition: f/u 2 weeks post cath or sooner if necessary.   Lonni Meager, NP 11/30/2023, 4:21 PM

## 2023-11-30 NOTE — Patient Instructions (Signed)
 Medication Instructions:  No changes *If you need a refill on your cardiac medications before your next appointment, please call your pharmacy*  Lab Work: Your provider would like for you to have the following labs today: CBC and BMET  If you have labs (blood work) drawn today and your tests are completely normal, you will receive your results only by: MyChart Message (if you have MyChart) OR A paper copy in the mail If you have any lab test that is abnormal or we need to change your treatment, we will call you to review the results.  Follow-Up: At [facility], you and your health needs are our priority.  As part of our continuing mission to provide you with exceptional heart care, our providers are all part of one team.  This team includes your primary Cardiologist (physician) and Advanced Practice Providers or APPs (Physician Assistants and Nurse Practitioners) who all work together to provide you with the care you need, when you need it.  Your next appointment:   3 week(s)  Provider:   You may see Courtney Hanson, MD or one of the following Advanced Practice Providers on your designated Care Team:   Courtney Meager, NP   We recommend signing up for the patient portal called MyChart.  Sign up information is provided on this After Visit Summary.  MyChart is used to connect with patients for Virtual Visits (Telemedicine).  Patients are able to view lab/test results, encounter notes, upcoming appointments, etc.  Non-urgent messages can be sent to your provider as well.   To learn more about what you can do with MyChart, go to [email or web address].   Other Instructions  [facility] A DEPT OF [facility] AT [facility] [address] [place], [address] [place] [address] Dept: [phone] Loc: [phone]  Courtney Wilcox  11/30/2023  You are scheduled for a Cardiac Catheterization on: we will call you  with a date and time.  1. Please arrive at the Heart & Vascular Center Entrance of [facility], [address], [place] [address] at (we will call you) (This is 1 hour(s) prior to your procedure time).  Proceed to the Check-In Desk directly inside the entrance.  Procedure Parking: Use the entrance off of the [address] side of the hospital. Turn right upon entering and follow the driveway to parking that is directly in front of the Heart & Vascular Center. There is no valet parking available at this entrance, however there is an awning directly in front of the Heart & Vascular Center for drop off/ pick up for patients.  Special note: Every effort is made to have your procedure done on time. Please understand that emergencies sometimes delay scheduled procedures.  2. Diet: We will call you   3. Hydration: We will call you  4. Labs: You will need to have blood drawn on 12/03/23. You do not need to be fasting.  5. Medication instructions in preparation for your procedure: Nothing to hold  On the morning of your procedure, take your Aspirin  81 mg and any morning medicines NOT listed above.  You may use sips of water.  6. Plan to go home the same day, you will only stay overnight if medically necessary. 7. Bring a current list of your medications and current insurance cards. 8. You MUST have a responsible person to drive you home. 9. Someone MUST be with you the first 24 hours after you arrive home or your discharge  will be delayed. 10. Please wear clothes that are easy to get on and off and wear slip-on shoes.  Thank you for allowing us  to care for you!   -- [facility]

## 2023-12-03 ENCOUNTER — Other Ambulatory Visit

## 2023-12-03 ENCOUNTER — Ambulatory Visit: Admitting: Physician Assistant

## 2023-12-03 NOTE — Telephone Encounter (Signed)
 Called the patient to go over her lab results. She has decided that she does not want to have the cardiac cath on the 25th but would like to postpone it until 12/2 with Dr. Mady.   Right and left heart cath has been scheduled for 12/18/23 with Dr. Mady. She will arrive at 6:30 am with 4 hours of hydration.   Patient has been made aware of instructions.   [facility] A DEPT OF [facility] AT [facility] [address] [place] [address] Dept: [phone] Loc: [phone]  Courtney Wilcox  12/03/2023  You are scheduled for a Cardiac Catheterization on Tuesday, December 2 with Dr. Lonni End.  1. Please arrive at the [facility] Center Entrance of [facility], [address] [place], [place] [address] at 6:30 AM (This is 4 hour(s) prior to your procedure time).  Proceed to the Check-In Desk directly inside the entrance.  Procedure Parking: Use the entrance off of the [address] side of the hospital. Turn right upon entering and follow the driveway to parking that is directly in front of the Heart & Vascular Center. There is no valet parking available at this entrance, however there is an awning directly in front of the Heart & Vascular Center for drop off/ pick up for patients.  Special note: Every effort is made to have your procedure done on time. Please understand that emergencies sometimes delay scheduled procedures.  2. Diet: Nothing to eat after midnight.   3. Hydration: You need to be well hydrated before your procedure. On December 2, you may drink approved liquids (see below) until 2 hours before the procedure, with 16 oz of water as your last intake.   List of approved liquids water, clear juice, clear tea, black coffee, fruit juices, non-citric and without pulp, carbonated beverages, Gatorade, Kool -Aid, plain Jello-O and plain ice popsicles.  4. Labs: Completed on 11/30/23  5. Medication  instructions in preparation for your procedure: Nothing to hold   On the morning of your procedure, take your Aspirin  81 mg and Plavix /Clopidogrel  and any morning medicines NOT listed above.  You may use sips of water.  6. Plan to go home the same day, you will only stay overnight if medically necessary. 7. Bring a current list of your medications and current insurance cards. 8. You MUST have a responsible person to drive you home. 9. Someone MUST be with you the first 24 hours after you arrive home or your discharge will be delayed. 10. Please wear clothes that are easy to get on and off and wear slip-on shoes.  Thank you for allowing us  to care for you!   -- [facility]

## 2023-12-06 ENCOUNTER — Telehealth: Payer: Self-pay | Admitting: Internal Medicine

## 2023-12-06 NOTE — Telephone Encounter (Signed)
 Notes and results indexed into patient's chart.

## 2023-12-10 ENCOUNTER — Telehealth: Payer: Self-pay | Admitting: Internal Medicine

## 2023-12-10 NOTE — Telephone Encounter (Signed)
 Spoke with patient daughter regarding labs. She had a notification on her phone that labs ordered by Dr End needed to be collected, I checked the chart and there were no future labs avaliable. After reviewing the chart more, recent heart cath information was given via My Chart and labs had already been collected on 11/14 for pre-procedure labs and no further labs are needed to proceed with cath. Daughter verbalized understanding. No further questions at this time.

## 2023-12-10 NOTE — Telephone Encounter (Signed)
 Pt's daughter would like a c/b to clarify current labs that is needed please advise

## 2023-12-11 ENCOUNTER — Ambulatory Visit

## 2023-12-11 ENCOUNTER — Ambulatory Visit: Admitting: Oncology

## 2023-12-17 ENCOUNTER — Telehealth: Payer: Self-pay | Admitting: Internal Medicine

## 2023-12-17 NOTE — Telephone Encounter (Signed)
 Pt is scheduled for heart cath procedure tomorrow but her daughter [name] is requesting a callback regarding her wanting to r/s it since pt isn't feeling well. Please advise

## 2023-12-17 NOTE — Telephone Encounter (Signed)
 Called to reschedule cath - awaiting return verification for 12/9 at 9:30 from [place]  Will return call to daughter with updated information  Arrive at 8:00 for IV hydration, cath at 12:30  Daughter verbalized understanding and agreement with plan

## 2023-12-18 DIAGNOSIS — R079 Chest pain, unspecified: Secondary | ICD-10-CM

## 2023-12-20 ENCOUNTER — Encounter: Payer: Self-pay | Admitting: General Surgery

## 2023-12-21 ENCOUNTER — Ambulatory Visit: Admitting: Nurse Practitioner

## 2023-12-22 ENCOUNTER — Emergency Department
Admission: EM | Admit: 2023-12-22 | Discharge: 2023-12-22 | Disposition: A | Discharge status: Home / Self Care | Attending: Emergency Medicine | Admitting: Emergency Medicine

## 2023-12-22 ENCOUNTER — Other Ambulatory Visit: Payer: Self-pay

## 2023-12-22 ENCOUNTER — Encounter: Payer: Self-pay | Admitting: Emergency Medicine

## 2023-12-22 ENCOUNTER — Emergency Department

## 2023-12-22 DIAGNOSIS — I2510 Atherosclerotic heart disease of native coronary artery without angina pectoris: Secondary | ICD-10-CM | POA: Insufficient documentation

## 2023-12-22 DIAGNOSIS — J45909 Unspecified asthma, uncomplicated: Secondary | ICD-10-CM | POA: Insufficient documentation

## 2023-12-22 DIAGNOSIS — I509 Heart failure, unspecified: Secondary | ICD-10-CM | POA: Insufficient documentation

## 2023-12-22 DIAGNOSIS — S29001A Unspecified injury of muscle and tendon of front wall of thorax, initial encounter: Secondary | ICD-10-CM | POA: Insufficient documentation

## 2023-12-22 DIAGNOSIS — N189 Chronic kidney disease, unspecified: Secondary | ICD-10-CM | POA: Insufficient documentation

## 2023-12-22 DIAGNOSIS — W1839XA Other fall on same level, initial encounter: Secondary | ICD-10-CM | POA: Insufficient documentation

## 2023-12-22 LAB — COMPREHENSIVE METABOLIC PANEL WITH GFR
ALT: 24 U/L (ref 0–44)
AST: 25 U/L (ref 15–41)
Albumin: 3.6 g/dL (ref 3.5–5.0)
Alkaline Phosphatase: 88 U/L (ref 38–126)
Anion gap: 11 (ref 5–15)
BUN: 17 mg/dL (ref 8–23)
CO2: 26 mmol/L (ref 22–32)
Calcium: 8.7 mg/dL — ABNORMAL LOW (ref 8.9–10.3)
Chloride: 95 mmol/L — ABNORMAL LOW (ref 98–111)
Creatinine, Ser: 1.41 mg/dL — ABNORMAL HIGH (ref 0.44–1.00)
GFR, Estimated: 38 mL/min — ABNORMAL LOW (ref >60)
Glucose, Bld: 117 mg/dL — ABNORMAL HIGH (ref 70–99)
Potassium: 4.2 mmol/L (ref 3.5–5.1)
Sodium: 132 mmol/L — ABNORMAL LOW (ref 135–145)
Total Bilirubin: 0.7 mg/dL (ref 0.0–1.2)
Total Protein: 5.8 g/dL — ABNORMAL LOW (ref 6.5–8.1)

## 2023-12-22 LAB — CBC
HCT: 39 % (ref 36.0–46.0)
Hemoglobin: 13 g/dL (ref 12.0–15.0)
MCH: 33.4 pg (ref 26.0–34.0)
MCHC: 33.3 g/dL (ref 30.0–36.0)
MCV: 100.3 fL — ABNORMAL HIGH (ref 80.0–100.0)
Platelets: 142 K/uL — ABNORMAL LOW (ref 150–400)
RBC: 3.89 MIL/uL (ref 3.87–5.11)
RDW: 14.6 % (ref 11.5–15.5)
WBC: 8.3 K/uL (ref 4.0–10.5)
nRBC: 0 % (ref 0.0–0.2)

## 2023-12-22 LAB — TROPONIN T, HIGH SENSITIVITY: Troponin T High Sensitivity: 40 ng/L — ABNORMAL HIGH (ref 0–19)

## 2023-12-22 MED ORDER — ONDANSETRON 4 MG PO TBDP: 4.0000 mg | ORAL_TABLET | Freq: Three times a day (TID) | ORAL | 0 refills | Status: AC | PRN

## 2023-12-22 MED ORDER — ONDANSETRON 4 MG PO TBDP
4.0000 mg | ORAL_TABLET | Freq: Once | ORAL | Status: AC
  Administered 2023-12-22: 4 mg via ORAL
  Filled 2023-12-22: qty 1

## 2023-12-22 MED ORDER — HYDROCODONE-ACETAMINOPHEN 5-325 MG PO TABS: 1.0000 | ORAL_TABLET | ORAL | 0 refills | Status: AC | PRN

## 2023-12-22 MED ORDER — HYDROCODONE-ACETAMINOPHEN 5-325 MG PO TABS
1.0000 | ORAL_TABLET | Freq: Once | ORAL | Status: AC
  Administered 2023-12-22: 1 via ORAL
  Filled 2023-12-22: qty 1

## 2023-12-22 NOTE — ED Notes (Signed)
 Allergies reviewed with Dr. Dorothyann.

## 2023-12-22 NOTE — ED Triage Notes (Addendum)
 Pt to ER with c/o fall on Tuesday that caused pain to left ribs.  Was seen at [facility] PCP on Wednesday and given Tramadol .  Pt has not been able to control pain with same.  Pt reports vomiting x 1 today.  Reports pain is under left breast. Pt states pain worsens with deep inspiration.

## 2023-12-22 NOTE — ED Provider Notes (Signed)
 [facility] Provider Note    Event Date/Time   First MD Initiated Contact with Patient 12/22/23 1631     (approximate)  History   Chief Complaint: Rib Injury  HPI  Courtney Wilcox is a 78 y.o. female with a past medical history of anxiety, CAD, CKD, CHF, gastric reflux, asthma, presents to the emergency department for left chest wall injury.  According to the patient on Tuesday she had a fall from the couch onto her left side.  Since that time she has been experiencing sharp pain in the left chest wall especially with deep breath or movement or palpation.  No cough.  No fever.  Physical Exam   Triage Vital Signs: ED Triage Vitals  Encounter Vitals Group     BP 12/22/23 1551 (!) 146/93     Girls Systolic BP Percentile --      Girls Diastolic BP Percentile --      Boys Systolic BP Percentile --      Boys Diastolic BP Percentile --      Pulse Rate 12/22/23 1551 (!) 124     Resp 12/22/23 1551 18     Temp 12/22/23 1551 (!) 97.5 F (36.4 C)     Temp Source 12/22/23 1551 Oral     SpO2 12/22/23 1551 96 %     Weight 12/22/23 1552 200 lb (90.7 kg)     Height 12/22/23 1552 5' 4 (1.626 m)     Head Circumference --      Peak Flow --      Pain Score 12/22/23 1552 10     Pain Loc --      Pain Education --      Exclude from Growth Chart --     Most recent vital signs: Vitals:   12/22/23 1551  BP: (!) 146/93  Pulse: (!) 124  Resp: 18  Temp: (!) 97.5 F (36.4 C)  SpO2: 96%    General: Awake, no distress.  CV:  Good peripheral perfusion.  Regular rate and rhythm  Resp:  Normal effort.  Equal breath sounds bilaterally.  Moderate left lateral chest wall tenderness to palpation. Abd:  No distention.    ED Results / Procedures / Treatments   EKG  EKG viewed and interpreted by myself shows a sinus rhythm at 100 bpm with a widened QRS, mild left axis deviation, largely normal intervals with nonspecific ST changes.  RADIOLOGY  I reviewed interpret  the chest x-ray images.  Patient appears to have a left lower rib fracture on my evaluation although not displaced. Radiology has read the x-ray is negative.   MEDICATIONS ORDERED IN ED: Medications  ondansetron  (ZOFRAN -ODT) disintegrating tablet 4 mg (has no administration in time range)  HYDROcodone -acetaminophen  (NORCO/VICODIN) 5-325 MG per tablet 1 tablet (has no administration in time range)     IMPRESSION / MDM / ASSESSMENT AND PLAN / ED COURSE  I reviewed the triage vital signs and the nursing notes.  Patient's presentation is most consistent with acute presentation with potential threat to life or bodily function.  Patient presents emergency department for left-sided chest wall pain after a fall Tuesday.  Patient has very reproducible pain with palpation.  Lab work shows a reassuring CBC, chemistry shows chronic kidney disease but no significant finding.  Troponin is slightly elevated as to be expected with chronic kidney disease.  X-ray is read as negative although on my evaluation appears to have a small rib fracture in the left lower chest  wall.  Regardless of fracture versus contusion her chest pain is very reproducible occurred after trauma to the chest.  Symptoms seem suggestive of chest wall injury with otherwise reassuring workup.  Will dose pain and nausea medication.  We will discharge on hydrocodone  and Zofran  to be used if needed.  We will also provide an incentive spirometer with instructions to use several times per hour while awake for the next 7 days.  I had a long discussion with the patient and family member regarding this to help prevent pneumonia.  Patient and family member agreeable to plan of care.  As a secondary complaint patient does have a small area/bleeding wound to the right lower abdomen where she states she had excoriated a prior scab to the area.  Patient is on Plavix  and states they have had a dressing on it for 1 day but it continues to bleed  intermittently.  No bleeding currently I covered the area with Dermabond followed by a nonadherent gauze and Tegaderm with instructions to remove in 2 to 3 days.  FINAL CLINICAL IMPRESSION(S) / ED DIAGNOSES   Chest wall injury   Note:  This document was prepared using Dragon voice recognition software and may include unintentional dictation errors.   Dorothyann Drivers, MD 12/22/23 1745

## 2023-12-22 NOTE — ED Notes (Signed)
 Pain is worsened with a deep breath. Patient's daughter is at bedside.

## 2023-12-22 NOTE — Discharge Instructions (Addendum)
 Please take your pain medication as needed, but only as prescribed.  Do not drink alcohol or drive while taking pain medication.  Please use your incentive spirometer 2-3 times per hour while awake for the next 7 days to help prevent pneumonia/infection.  Return to the emergency department for any worsening pain any trouble breathing or any other symptom personally concerning to yourself.

## 2023-12-23 ENCOUNTER — Other Ambulatory Visit: Payer: Self-pay

## 2023-12-23 ENCOUNTER — Emergency Department

## 2023-12-23 ENCOUNTER — Observation Stay
Admission: EM | Admit: 2023-12-23 | Discharge: 2023-12-28 | DRG: 176 | Disposition: A | Attending: Emergency Medicine | Admitting: Emergency Medicine

## 2023-12-23 DIAGNOSIS — I872 Venous insufficiency (chronic) (peripheral): Secondary | ICD-10-CM | POA: Diagnosis present

## 2023-12-23 DIAGNOSIS — I10 Essential (primary) hypertension: Secondary | ICD-10-CM | POA: Insufficient documentation

## 2023-12-23 DIAGNOSIS — I2510 Atherosclerotic heart disease of native coronary artery without angina pectoris: Secondary | ICD-10-CM

## 2023-12-23 DIAGNOSIS — I2699 Other pulmonary embolism without acute cor pulmonale: Principal | ICD-10-CM | POA: Diagnosis present

## 2023-12-23 DIAGNOSIS — R112 Nausea with vomiting, unspecified: Secondary | ICD-10-CM

## 2023-12-23 DIAGNOSIS — Z86711 Personal history of pulmonary embolism: Secondary | ICD-10-CM | POA: Diagnosis present

## 2023-12-23 DIAGNOSIS — M069 Rheumatoid arthritis, unspecified: Secondary | ICD-10-CM | POA: Diagnosis present

## 2023-12-23 DIAGNOSIS — N1831 Chronic kidney disease, stage 3a: Secondary | ICD-10-CM | POA: Insufficient documentation

## 2023-12-23 DIAGNOSIS — J449 Chronic obstructive pulmonary disease, unspecified: Secondary | ICD-10-CM | POA: Insufficient documentation

## 2023-12-23 DIAGNOSIS — S20212D Contusion of left front wall of thorax, subsequent encounter: Secondary | ICD-10-CM

## 2023-12-23 DIAGNOSIS — I471 Supraventricular tachycardia, unspecified: Secondary | ICD-10-CM | POA: Diagnosis present

## 2023-12-23 HISTORY — DX: Chronic obstructive pulmonary disease, unspecified: J44.9

## 2023-12-23 LAB — COMPREHENSIVE METABOLIC PANEL WITH GFR
ALT: 25 U/L (ref 0–44)
AST: 28 U/L (ref 15–41)
Albumin: 3.8 g/dL (ref 3.5–5.0)
Alkaline Phosphatase: 91 U/L (ref 38–126)
Anion gap: 11 (ref 5–15)
BUN: 14 mg/dL (ref 8–23)
CO2: 27 mmol/L (ref 22–32)
Calcium: 9 mg/dL (ref 8.9–10.3)
Chloride: 93 mmol/L — ABNORMAL LOW (ref 98–111)
Creatinine, Ser: 1.2 mg/dL — ABNORMAL HIGH (ref 0.44–1.00)
GFR, Estimated: 47 mL/min — ABNORMAL LOW (ref >60)
Glucose, Bld: 93 mg/dL (ref 70–99)
Potassium: 4.5 mmol/L (ref 3.5–5.1)
Sodium: 131 mmol/L — ABNORMAL LOW (ref 135–145)
Total Bilirubin: 0.7 mg/dL (ref 0.0–1.2)
Total Protein: 6.1 g/dL — ABNORMAL LOW (ref 6.5–8.1)

## 2023-12-23 LAB — CBC
HCT: 37.8 % (ref 36.0–46.0)
Hemoglobin: 12.9 g/dL (ref 12.0–15.0)
MCH: 33.5 pg (ref 26.0–34.0)
MCHC: 34.1 g/dL (ref 30.0–36.0)
MCV: 98.2 fL (ref 80.0–100.0)
Platelets: 143 K/uL — ABNORMAL LOW (ref 150–400)
RBC: 3.85 MIL/uL — ABNORMAL LOW (ref 3.87–5.11)
RDW: 14.2 % (ref 11.5–15.5)
WBC: 7.9 K/uL (ref 4.0–10.5)
nRBC: 0 % (ref 0.0–0.2)

## 2023-12-23 LAB — PROTIME-INR
INR: 1 (ref 0.8–1.2)
Prothrombin Time: 13.3 s (ref 11.4–15.2)

## 2023-12-23 LAB — PRO BRAIN NATRIURETIC PEPTIDE: Pro Brain Natriuretic Peptide: 272 pg/mL (ref <300.0)

## 2023-12-23 LAB — TROPONIN T, HIGH SENSITIVITY
Troponin T High Sensitivity: 38 ng/L — ABNORMAL HIGH (ref 0–19)
Troponin T High Sensitivity: 35 ng/L — ABNORMAL HIGH (ref 0–19)

## 2023-12-23 LAB — APTT: aPTT: 27 s (ref 24–36)

## 2023-12-23 MED ORDER — HEPARIN BOLUS VIA INFUSION
4500.0000 [IU] | Freq: Once | INTRAVENOUS | Status: AC
  Administered 2023-12-23: 4500 [IU] via INTRAVENOUS
  Filled 2023-12-23: qty 4500

## 2023-12-23 MED ORDER — ONDANSETRON HCL 4 MG PO TABS
4.0000 mg | ORAL_TABLET | Freq: Four times a day (QID) | ORAL | Status: DC | PRN
  Administered 2023-12-27: 4 mg via ORAL
  Filled 2023-12-23: qty 1

## 2023-12-23 MED ORDER — ONDANSETRON HCL 4 MG/2ML IJ SOLN
4.0000 mg | Freq: Four times a day (QID) | INTRAMUSCULAR | Status: DC | PRN
  Administered 2023-12-23 – 2023-12-24 (×3): 4 mg via INTRAVENOUS
  Filled 2023-12-23 (×3): qty 2

## 2023-12-23 MED ORDER — PANTOPRAZOLE SODIUM 40 MG IV SOLR
40.0000 mg | INTRAVENOUS | Status: DC
  Administered 2023-12-23: 40 mg via INTRAVENOUS
  Filled 2023-12-23: qty 10

## 2023-12-23 MED ORDER — HEPARIN (PORCINE) 25000 UT/250ML-% IV SOLN
1200.0000 [IU]/h | INTRAVENOUS | Status: DC
  Administered 2023-12-23 – 2023-12-24 (×2): 1200 [IU]/h via INTRAVENOUS
  Administered 2023-12-25 – 2023-12-27 (×3): 1100 [IU]/h via INTRAVENOUS
  Filled 2023-12-23 (×5): qty 250

## 2023-12-23 MED ORDER — SODIUM CHLORIDE 0.9 % IV BOLUS
1000.0000 mL | Freq: Once | INTRAVENOUS | Status: AC
  Administered 2023-12-23: 1000 mL via INTRAVENOUS

## 2023-12-23 MED ORDER — SODIUM CHLORIDE 0.9 % IV SOLN
12.5000 mg | Freq: Four times a day (QID) | INTRAVENOUS | Status: DC | PRN
  Administered 2023-12-24: 12.5 mg via INTRAVENOUS
  Filled 2023-12-23: qty 12.5

## 2023-12-23 MED ORDER — HYDROCODONE-ACETAMINOPHEN 5-325 MG PO TABS
1.0000 | ORAL_TABLET | ORAL | Status: DC | PRN
  Administered 2023-12-24 – 2023-12-26 (×3): 1 via ORAL
  Filled 2023-12-23: qty 1
  Filled 2023-12-23: qty 2
  Filled 2023-12-23 (×2): qty 1

## 2023-12-23 MED ORDER — IOHEXOL 350 MG/ML SOLN
100.0000 mL | Freq: Once | INTRAVENOUS | Status: AC | PRN
  Administered 2023-12-23: 100 mL via INTRAVENOUS

## 2023-12-23 MED ORDER — MORPHINE SULFATE (PF) 2 MG/ML IV SOLN
2.0000 mg | INTRAVENOUS | Status: DC | PRN
  Administered 2023-12-23 – 2023-12-24 (×2): 2 mg via INTRAVENOUS
  Filled 2023-12-23 (×2): qty 1

## 2023-12-23 MED ORDER — ALBUTEROL SULFATE (2.5 MG/3ML) 0.083% IN NEBU
5.0000 mg | INHALATION_SOLUTION | Freq: Once | RESPIRATORY_TRACT | Status: AC
  Administered 2023-12-23: 5 mg via RESPIRATORY_TRACT
  Filled 2023-12-23: qty 6

## 2023-12-23 MED ORDER — SODIUM CHLORIDE 0.9 % IV SOLN: INTRAVENOUS | Status: AC

## 2023-12-23 MED ORDER — ACETAMINOPHEN 650 MG RE SUPP: 650.0000 mg | Freq: Four times a day (QID) | RECTAL | Status: DC | PRN

## 2023-12-23 MED ORDER — ONDANSETRON HCL 4 MG/2ML IJ SOLN
4.0000 mg | Freq: Once | INTRAMUSCULAR | Status: AC
  Administered 2023-12-23: 4 mg via INTRAVENOUS
  Filled 2023-12-23: qty 2

## 2023-12-23 MED ORDER — FLUTICASONE FUROATE-VILANTEROL 100-25 MCG/ACT IN AEPB
1.0000 | INHALATION_SPRAY | Freq: Every day | RESPIRATORY_TRACT | Status: DC
  Administered 2023-12-25 – 2023-12-28 (×4): 1 via RESPIRATORY_TRACT
  Filled 2023-12-23 (×2): qty 28

## 2023-12-23 MED ORDER — IPRATROPIUM-ALBUTEROL 0.5-2.5 (3) MG/3ML IN SOLN
3.0000 mL | RESPIRATORY_TRACT | Status: DC | PRN
  Administered 2023-12-24 – 2023-12-26 (×2): 3 mL via RESPIRATORY_TRACT
  Filled 2023-12-23 (×2): qty 3

## 2023-12-23 MED ORDER — ACETAMINOPHEN 325 MG PO TABS
650.0000 mg | ORAL_TABLET | Freq: Four times a day (QID) | ORAL | Status: DC | PRN
  Administered 2023-12-24 – 2023-12-28 (×5): 650 mg via ORAL
  Filled 2023-12-23 (×6): qty 2

## 2023-12-23 NOTE — Consult Note (Signed)
 PHARMACY - ANTICOAGULATION CONSULT NOTE  Pharmacy Consult for Heparin   Indication: pulmonary embolus  Allergies  Allergen Reactions   Amiodarone  Other (See Comments)    Fatigue, Nausea, anorexia, malaise   Lactose Other (See Comments) and Diarrhea   Hydrocodone -Acetaminophen  Itching   Other Other (See Comments)    Pt ONLY tolerates SLOW-FE (slow released iron )---upsets IBS   Biaxin [Clarithromycin] Other (See Comments)    GI upset    Hydrocodone -Acetaminophen  Itching    Tolerates acetaminophen     Oxycodone  Itching   Sulfa Antibiotics Diarrhea and Other (See Comments)    GI upset   Sulfasalazine Diarrhea    Patient Measurements: Height: 5' 4 (162.6 cm) Weight: 90.7 kg (199 lb 15.3 oz) IBW/kg (Calculated) : 54.7 HEPARIN  DW (KG): 75.1  Vital Signs: Temp: 98 F (36.7 C) (12/07 1712) Temp Source: Oral (12/07 1712) BP: 133/102 (12/07 1712) Pulse Rate: 110 (12/07 1712)  Labs: Recent Labs    12/22/23 1606 12/23/23 1719  HGB 13.0 12.9  HCT 39.0 37.8  PLT 142* 143*  CREATININE 1.41* 1.20*    Estimated Creatinine Clearance: 43.5 mL/min (A) (by C-G formula based on SCr of 1.2 mg/dL (H)).   Medical History: Past Medical History:  Diagnosis Date   Allergic rhinitis    Anxiety    Arthritis    Asthma    Atrophic vaginitis    B12 deficiency    Bronchitis, chronic (HCC)    CAD (coronary artery disease)    a. 03/2003 Cath: Nl cors; b. 09/2022 Staged PCI: LM nl, LAD 85p/21m (3.5x26 Onyx Frontier DES), RI small, mild dzs, LCX nl, OM2 mild dzs, RCA 30p.   Cervical disc disease    CKD (chronic kidney disease), stage III (HCC)    Collagen vascular disease    RA   COPD (chronic obstructive pulmonary disease) (HCC)    Cough - post-COVID    Depression    Diastolic dysfunction    a. 11/2020 Echo: EF 45-50%, GrI DD; b. 10/2021 Echo: EF 60-65%, GrII DD; c. 09/2022 Echo: EF 55-60%, no rwma, GrI DD, nl RV fxn, mildly dil LA. No significant valvular dzs; d. 04/2023 Echo: EF 60-65%  no rwma, mild LVH, nl RV fxn, RVSP 23.66mmHg. AoV sclerosis.   Fibrocystic breast disease    Foot ulcer (HCC)    GERD (gastroesophageal reflux disease)    Hammer toe    Hyperlipidemia    IBS (irritable bowel syndrome)    Macrocytic anemia    Macular degeneration 2023   Obesity    OSA (obstructive sleep apnea)    Osteoarthrosis    Multi site   Osteopenia    PSVT (paroxysmal supraventricular tachycardia)    a. 04/2022 Zio: Sinus rhythm @ 57 (42-101). Rare PACs/PVCs. 74290 SVT runs lasting up to 69m 12sec - max rate 222. 5 beats NSVT. Triggered events = sinus, SVT, PACs.   Rheumatoid arthritis (HCC)    Shingles    Thyroid  nodule    MULTIPLE   Tonsillitis    recurrent   Umbilical hernia     Medications:  (Not in a hospital admission)  Scheduled:  Infusions:  PRN:  Anti-infectives (From admission, onward)    None       Assessment: Pt presents to the ED after a fall and increasing SOB last few weeks. Hx of aflutter, SVT, total replacement of hip, CAD, and CKD. Head CT, no note of bleed. Chest CT: Right-sided central and segmental pulmonary emboli. No right heart strain. CBC stable. No note of  DOAC PTA. Pharmacy consulted to start heparin  infusion for PE.   Goal of Therapy:  Heparin  level 0.3-0.7 units/ml Monitor platelets by anticoagulation protocol: Yes   Plan:  Give 4500 units bolus x 1 Start heparin  infusion at 1200 units/hr Check anti-Xa level in 8 hours and daily while on heparin  Continue to monitor H&H and platelets  Cathaleen GORMAN Blanch, PharmD, BCPS 12/23/2023,7:50 PM

## 2023-12-23 NOTE — ED Notes (Signed)
 Patient transported to CT

## 2023-12-23 NOTE — Assessment & Plan Note (Signed)
 Troponin slightly elevated but flat in past 24 hours: 40-38-35 Continue GDMT-holding antiplatelets for now Has been on DAPT since stent 09/2022 Can Consider cardiology input for decision on resumption of antiplatelet of choice

## 2023-12-23 NOTE — Assessment & Plan Note (Addendum)
 History of prior PEs, off anticoagulation x 1 year -Patient  dyspneic tachycardic and tachypneic in the setting of being off AC x 1 year to decrease bleeding risk with concomitant DAPT -CTA chest showing central and segmental right-sided PE with trace left pleural effusion -Continue heparin  and transition to oral in a day -Patient will likely need lifelong anticoagulation -Currently on dual antiplatelet therapy with clopidogrel  and Ranexa-Will hold tonight to decrease bleeding risk, pending cardiology input -# Supplemental oxygen if needed

## 2023-12-23 NOTE — Assessment & Plan Note (Signed)
 No acute issues suspected Continuous cardiac monitoring

## 2023-12-23 NOTE — Assessment & Plan Note (Signed)
Not acutely exacerbated Continue home inhalers.   

## 2023-12-23 NOTE — ED Triage Notes (Signed)
 Pt to ED with daughter. Seen here yesterday after fall and dx with L lib fx. Pt was nauseous and vomited once yesterday, discharged with zofran  and tylenol . Nausea has increased and pt has been unable to take pain meds due to nausea. Has vomited twice today (multiple times each time) and is unable to keep anything down. Pt feels weak. NP at bedside in triage. Daughter concerned pt may be dehydrated. Pt denies abdominal pain. Pt has upcoming appt for cardiac cath to rule out pulmonary HTN and has been increasingly SOB over last few weeks.

## 2023-12-23 NOTE — ED Provider Notes (Signed)
 [facility] Provider Note    Event Date/Time   First MD Initiated Contact with Patient 12/23/23 1744     (approximate)   History   Chief Complaint: Emesis, Weakness, rib pain (fx), and Shortness of Breath   HPI  Courtney Wilcox is a 78 y.o. female with a 3 of GERD, CKD, CAD who is brought to the ED due to persistent shortness of breath and chest pain after being diagnosed with a left rib fracture yesterday after a fall at home.  The fall is described as mechanical, patient was walking to her recliner, feeling somewhat out of breath, and bumped into the furniture causing her to fall over.  She does endorse nonproductive cough.  Shortness of breath has been worsening for the past week.  Since yesterday she has also had nausea and vomiting and feels dehydrated.  She notes that she is scheduled for left heart cath and right heart cath by cardiology in 2 days due to suspicion of pulmonary hypertension.  Reviewed outside records noting that patient had previous CT chest November 21, 2023 which was negative for PE.  Mother bedside also notes that with the fall, patient did hit her head, has not had neuroimaging.        Past Medical History:  Diagnosis Date   Allergic rhinitis    Anxiety    Arthritis    Asthma    Atrophic vaginitis    B12 deficiency    Bronchitis, chronic (HCC)    CAD (coronary artery disease)    a. 03/2003 Cath: Nl cors; b. 09/2022 Staged PCI: LM nl, LAD 85p/69m (3.5x26 Onyx Frontier DES), RI small, mild dzs, LCX nl, OM2 mild dzs, RCA 30p.   Cervical disc disease    CKD (chronic kidney disease), stage III (HCC)    Collagen vascular disease    RA   COPD (chronic obstructive pulmonary disease) (HCC)    Cough - post-COVID    Depression    Diastolic dysfunction    a. 11/2020 Echo: EF 45-50%, GrI DD; b. 10/2021 Echo: EF 60-65%, GrII DD; c. 09/2022 Echo: EF 55-60%, no rwma, GrI DD, nl RV fxn, mildly dil LA. No significant valvular dzs; d.  04/2023 Echo: EF 60-65% no rwma, mild LVH, nl RV fxn, RVSP 23.78mmHg. AoV sclerosis.   Fibrocystic breast disease    Foot ulcer (HCC)    GERD (gastroesophageal reflux disease)    Hammer toe    Hyperlipidemia    IBS (irritable bowel syndrome)    Macrocytic anemia    Macular degeneration 2023   Nausea and vomiting 12/23/2023   Obesity    OSA (obstructive sleep apnea)    Osteoarthrosis    Multi site   Osteopenia    PSVT (paroxysmal supraventricular tachycardia)    a. 04/2022 [facility]: Sinus rhythm @ 57 (42-101). Rare PACs/PVCs. 74290 SVT runs lasting up to 37m 12sec - max rate 222. 5 beats NSVT. Triggered events = sinus, SVT, PACs.   Rheumatoid arthritis (HCC)    Shingles    Thyroid  nodule    MULTIPLE   Tonsillitis    recurrent   Umbilical hernia     Current Outpatient Rx   Order #: [id number] Class: Historical Med   Order #: [id number] Class: Historical Med   Order #: [id number] Class: Historical Med   Order #: [id number] Class: Historical Med   Order #: [id number] Class: Historical Med   Order #: [id number] Class: Normal   Order #: [id number] Class: Historical Med   Order #: [id number] Class: Historical  Med   Order #: [id number] Class: Normal   Order #: [id number] Class: Historical Med   Order #: [id number] Class: Historical Med   Order #: [id number] Class: Historical Med   Order #: [id number] Class: Historical Med   Order #: [id number] Class: Historical Med   Order #: [id number] Class: Historical Med   Order #: [id number] Class: Normal   Order #: [id number] Class: Historical Med   Order #: [id number] Class: Historical Med   Order #: [id number] Class: Historical Med   Order #: [id number] Class: Historical Med   Order #: [id number] Class: Historical Med   Order #: [id number] Class: Historical Med   Order #: [id number] Class: Normal   Order #: [id number] Class: Normal   Order #: [id number] Class: Historical Med   Order #: [id number] Class: No Print   Order #: [id number] Class: Historical Med   Order #: [id number] Class: Historical  Med   Order #: [id number] Class: Historical Med    Past Surgical History:  Procedure Laterality Date   BACK SURGERY  2011   LAMINECTOMY   BREAST BIOPSY Right    neg   CERVICAL FUSION  2011, 2012   X 2   COLONOSCOPY  2005, 2015   COLONOSCOPY WITH PROPOFOL  N/A 08/04/2020   Procedure: COLONOSCOPY WITH PROPOFOL ;  Surgeon: Dessa Reyes ORN, MD;  Location: [facility] ENDOSCOPY;  Service: Endoscopy;  Laterality: N/A;   CORONARY STENT INTERVENTION N/A 10/02/2022   Procedure: CORONARY STENT INTERVENTION;  Surgeon: Darron Deatrice LABOR, MD;  Location: [facility] IN[facility]ASIVE [facility];  Service: Cardiovascular;  Laterality: N/A;   ESOPHAGOGASTRODUODENOSCOPY (EGD) WITH PROPOFOL  N/A 09/26/2021   Procedure: ESOPHAGOGASTRODUODENOSCOPY (EGD) WITH PROPOFOL ;  Surgeon: Onita Elspeth Sharper, DO;  Location: [facility] ENDOSCOPY;  Service: Gastroenterology;  Laterality: N/A;   ESOPHAGOGASTRODUODENOSCOPY ENDOSCOPY  2005, 2013, 2015   EYE SURGERY Bilateral    Cataract Extraction with IOL    HERNIA REPAIR     JOINT REPLACEMENT     KNEE ARTHROPLASTY Left 07/12/2016   Procedure: COMPUTER ASSISTED TOTAL KNEE ARTHROPLASTY;  Surgeon: Mardee Lynwood SQUIBB, MD;  Location: [facility] ORS;  Service: Orthopedics;  Laterality: Left;   Laryngeal tear after intubation w/repair     LUMBAR DISC SURGERY  12/2009   REPLACEMENT TOTAL KNEE Right 06/2007   DR. HOOTEN, [facility]   REPLACEMENT UNICONDYLAR JOINT KNEE     DR. HOOTEN, [facility]   RIGHT/LEFT HEART CATH AND CORONARY ANGIOGRAPHY N/A 09/29/2022   Procedure: RIGHT/LEFT HEART CATH AND CORONARY ANGIOGRAPHY;  Surgeon: Darron Deatrice LABOR, MD;  Location: [facility] INV[facility]IVE CV LAB;  Service: Cardiovascular;  Laterality: N/A;   SHOULDER ARTHROSCOPY WITH ROTATOR CUFF REPAIR Right    TONSILLECTOMY     TOTAL HIP ARTHROPLASTY Right 01/31/2017   Procedure: TOTAL HIP ARTHROPLASTY;  Surgeon: Mardee Lynwood SQUIBB, MD;  Location: [facility] ORS;  Service: Orthopedics;  Laterality: Right;   TUBAL LIGATION     UPPER GI ENDOSCOPY     VENTRAL HERNIA  REPAIR N/A 09/04/2023   Procedure: REPAIR, HERNIA, VENTRAL;  Surgeon: Marinda Jayson KIDD, MD;  Location: [facility] ORS;  Service: General;  Laterality: N/A;    Physical Exam   Triage Vital Signs: ED Triage Vitals  Encounter Vitals Group     BP 12/23/23 1712 (!) 133/102     Girls Systolic BP Percentile --      Girls Diastolic BP Percentile --      Boys Systolic BP Percentile --      Boys Diastolic BP Percentile --      Pulse Rate 12/23/23 1712 (!) 110     Resp 12/23/23 1712 (!) 28     Temp 12/23/23  1712 98 F (36.7 C)     Temp Source 12/23/23 1712 Oral     SpO2 12/23/23 1712 98 %     Weight 12/23/23 1716 199 lb 15.3 oz (90.7 kg)     Height 12/23/23 1716 5' 4 (1.626 m)     Head Circumference --      Peak Flow --      Pain Score 12/23/23 1713 0     Pain Loc --      Pain Education --      Exclude from Growth Chart --     Most recent vital signs: Vitals:   12/23/23 1712  BP: (!) 133/102  Pulse: (!) 110  Resp: (!) 28  Temp: 98 F (36.7 C)  SpO2: 98%    General: Awake, mild respiratory distress.  CV:  Good peripheral perfusion.  Tachycardia heart rate 110 Resp:  Tachypnea respiratory rate of 26.  Clear lungs. Abd:  No distention.  Soft nontender Other:  Somewhat dry oral mucosa.  Bruising on the right forehead.   ED Results / Procedures / Treatments   Labs (all labs ordered are listed, but only abnormal results are displayed) Labs Reviewed  CBC - Abnormal; Notable for the following components:      Result Value   RBC 3.85 (*)    Platelets 143 (*)    All other components within normal limits  COMPREHENSIVE METABOLIC PANEL WITH GFR - Abnormal; Notable for the following components:   Sodium 131 (*)    Chloride 93 (*)    Creatinine, Ser 1.20 (*)    Total Protein 6.1 (*)    GFR, Estimated 47 (*)    All other components within normal limits  TROPONIN T, HIGH SENSITIVITY - Abnormal; Notable for the following components:   Troponin T High Sensitivity 38 (*)    All other  components within normal limits  TROPONIN T, HIGH SENSITIVITY - Abnormal; Notable for the following components:   Troponin T High Sensitivity 35 (*)    All other components within normal limits  PRO BRAIN NATRIURETIC PEPTIDE  APTT  PROTIME-INR  APTT  HEPARIN  LEVEL (UNFRACTIONATED)  CBC     EKG Interpreted by me Sinus tachycardia rate 116.  Normal axis, normal intervals.  Normal QRS ST segments and T waves.   RADIOLOGY Chest x-ray interpreted by me, unremarkable.  Radiology report reviewed  CT head negative for acute findings  CT chest reveals right sided central and segmental PE.  Discussed with radiology.  CT abdomen pelvis unremarkable   PROCEDURES:  .Critical Care  Performed by: Viviann Pastor, MD Authorized by: Viviann Pastor, MD   Critical care provider statement:    Critical care time (minutes):  35   Critical care time was exclusive of:  Separately billable procedures and treating other patients   Critical care was necessary to treat or prevent imminent or life-threatening deterioration of the following conditions:  Cardiac failure, circulatory failure and respiratory failure   Critical care was time spent personally by me on the following activities:  Development of treatment plan with patient or surrogate, discussions with consultants, evaluation of patient's response to treatment, examination of patient, obtaining history from patient or surrogate, ordering and performing treatments and interventions, ordering and review of laboratory studies, ordering and review of radiographic studies, pulse oximetry, re-evaluation of patient's condition and review of old charts   Care discussed with: admitting provider      MEDICATIONS ORDERED IN ED: Medications  heparin  ADULT infusion 100 units/mL (  25000 units/230mL) (1,200 Units/hr Intravenous New Bag/Given 12/23/23 2038)  sodium chloride  0.9 % bolus 1,000 mL (1,000 mLs Intravenous New Bag/Given 12/23/23 1824)   ondansetron  (ZOFRAN ) injection 4 mg (4 mg Intravenous Given 12/23/23 1823)  iohexol  (OMNIPAQUE ) 350 MG/ML injection 100 mL (100 mLs Intravenous Contrast Given 12/23/23 1848)  albuterol  (PROVENTIL ) (2.5 MG/3ML) 0.083% nebulizer solution 5 mg (5 mg Nebulization Given 12/23/23 1911)  heparin  bolus via infusion 4,500 Units (4,500 Units Intravenous Bolus from Bag 12/23/23 2038)     IMPRESSION / MDM / ASSESSMENT AND PLAN / ED COURSE  I reviewed the triage vital signs and the nursing notes.  DDx: NSTEMI, pneumonia, pleural effusion, pulmonary edema, pulmonary embolism, bowel obstruction, gastritis, viral illness, intracranial hemorrhage  Patient's presentation is most consistent with acute presentation with potential threat to life or bodily function.  Patient presents with shortness of breath, nausea vomiting, gradually worsening symptoms.  With her tachypnea tachycardia, I am worried about PE despite recent negative imaging.  Imaging was repeated today which is positive for right sided PE.  No right heart strain.  Will start heparin .   Clinical Course as of 12/23/23 2105  [place] Dec 23, 2023  2011 Case d/w hopspitalist [PS]    Clinical Course User Index [PS] Viviann Pastor, MD     FINAL CLINICAL IMPRESSION(S) / ED DIAGNOSES   Final diagnoses:  Other acute pulmonary embolism without acute cor pulmonale (HCC)     Rx / DC Orders   ED Discharge Orders     None        Note:  This document was prepared using Dragon voice recognition software and may include unintentional dictation errors.   Viviann Pastor, MD 12/23/23 2112

## 2023-12-23 NOTE — Assessment & Plan Note (Signed)
-   Continue home meds

## 2023-12-23 NOTE — Assessment & Plan Note (Signed)
 -  Renal function  at baseline

## 2023-12-23 NOTE — Assessment & Plan Note (Signed)
 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 Antiemetics, IV hydration, IV Protonix  CT abdomen and pelvis unrevealing Supportive care Will keep n.p.o. tonight Will get GI consult to evaluate for EGD-family very concerned

## 2023-12-23 NOTE — H&P (Incomplete)
 History and Physical    Patient: Courtney Wilcox FMW:[id number] DOB: 11/14/1945 DOA: 12/23/2023 DOS: the patient was seen and examined on 12/23/2023 PCP: Marikay Eva POUR, PA  Patient coming from: Home  Chief Complaint:  Chief Complaint  Patient presents with   Emesis   Weakness   rib pain (fx)   Shortness of Breath    HPI: Courtney Wilcox is a 78 y.o. female with medical history significant for HTN, CAD s/p stent (09/2022), rheumatoid arthritis on chronic immunosuppressants and prednisone , CKD llla, SVT, COPD, OSA, prior recurrent PE (off Eliquis  x 1 year due to being on DAPT), being admitted with a right sided pulmonary embolism(central and segmental).  History provided by the daughter who states that for the past week she has been feeling very weak and having nausea, in the setting of a 50-month history of ongoing shortness of breath currently being evaluated by pulmonology and cardiology.  At baseline she is independent but about 4 days ago she fell forward onto her face and left side while she was trying to sit on a couch and since then her condition seem to worsen.  She developed worsening shortness of breath and also had left-sided chest pain for which she was seen in the ED on 12/22/2023.  Last night she developed vomiting which she attributed to the pain medicine and due to feeling poorly she requested to come back to the emergency room.  Of note, patient was evaluated with a CTA chest a month ago  was negative for PE and she has an upcoming LHC/RHC to further evaluate for pulmonary hypertension due to months of shortness of breath.  She is currently completing a tapering dexamethasone  pack ordered by her pulmonologist prior to going back on her daily prednisone  5 mg..  On arrival, tachycardic to 110, tachypneic to 28 with O2 sat 98% on room air, BP 133/102 and afebrile. Labs were for the most part unremarkable.  Troponin 38-35 (down from 40 the day prior )and proBNP 272.  Creatinine  at baseline at 1.2, mild hyponatremia of 131.  CBC unremarkable. EKG showed sinus tachycardia with PACs at 116 CTA PE showed a right sided central and segmental pulmonary emboli without heart strain and trace left pleural effusion CT abdomen and pelvis with contrast showed no acute intra-abdominal or intrapelvic process CT head was nonacute Patient treated with an NS bolus and Zofran .  She was started on a heparin  infusion Admission requested     Past Medical History:  Diagnosis Date   Allergic rhinitis    Anxiety    Arthritis    Asthma    Atrophic vaginitis    B12 deficiency    Bronchitis, chronic (HCC)    CAD (coronary artery disease)    a. 03/2003 Cath: Nl cors; b. 09/2022 Staged PCI: LM nl, LAD 85p/72m (3.5x26 Onyx Frontier DES), RI small, mild dzs, LCX nl, OM2 mild dzs, RCA 30p.   Cervical disc disease    CKD (chronic kidney disease), stage III (HCC)    Collagen vascular disease    RA   COPD (chronic obstructive pulmonary disease) (HCC)    Cough - post-COVID    Depression    Diastolic dysfunction    a. 11/2020 Echo: EF 45-50%, GrI DD; b. 10/2021 Echo: EF 60-65%, GrII DD; c. 09/2022 Echo: EF 55-60%, no rwma, GrI DD, nl RV fxn, mildly dil LA. No significant valvular dzs; d. 04/2023 Echo: EF 60-65% no rwma, mild LVH, nl RV fxn, RVSP 23.32mmHg. AoV sclerosis.  Fibrocystic breast disease    Foot ulcer (HCC)    GERD (gastroesophageal reflux disease)    Hammer toe    Hyperlipidemia    IBS (irritable bowel syndrome)    Macrocytic anemia    Macular degeneration 2023   Obesity    OSA (obstructive sleep apnea)    Osteoarthrosis    Multi site   Osteopenia    PSVT (paroxysmal supraventricular tachycardia)    a. 04/2022 Zio: Sinus rhythm @ 57 (42-101). Rare PACs/PVCs. 74290 SVT runs lasting up to 48m 12sec - max rate 222. 5 beats NSVT. Triggered events = sinus, SVT, PACs.   Rheumatoid arthritis (HCC)    Shingles    Thyroid  nodule    MULTIPLE   Tonsillitis    recurrent   Umbilical  hernia    Past Surgical History:  Procedure Laterality Date   BACK SURGERY  2011   LAMINECTOMY   BREAST BIOPSY Right    neg   CERVICAL FUSION  2011, 2012   X 2   COLONOSCOPY  2005, 2015   COLONOSCOPY WITH PROPOFOL  N/A 08/04/2020   Procedure: COLONOSCOPY WITH PROPOFOL ;  Surgeon: Dessa Reyes ORN, MD;  Location: [facility] ENDOSCOPY;  Service: Endoscopy;  Laterality: N/A;   CORONARY STENT INTERVENTION N/A 10/02/2022   Procedure: CORONARY STENT INTERVENTION;  Surgeon: Darron Deatrice LABOR, MD;  Location: [facility];  Service: Cardiovascular;  Laterality: N/A;   ESOPHAGOGASTRODUODENOSCOPY (EGD) WITH PROPOFOL  N/A 09/26/2021   Procedure: ESOPHAGOGASTRODUODENOSCOPY (EGD) WITH PROPOFOL ;  Surgeon: Onita Elspeth Sharper, DO;  Location: [facility] ENDOSCOPY;  Service: Gastroenterology;  Laterality: N/A;   ESOPHAGOGASTRODUODENOSCOPY ENDOSCOPY  2005, 2013, 2015   EYE SURGERY Bilateral    Cataract Extraction with IOL    HERNIA REPAIR     JOINT REPLACEMENT     KNEE ARTHROPLASTY Left 07/12/2016   Procedure: COMPUTER ASSISTED TOTAL KNEE ARTHROPLASTY;  Surgeon: Mardee Lynwood SQUIBB, MD;  Location: [facility] ORS;  Service: Orthopedics;  Laterality: Left;   Laryngeal tear after intubation w/repair     LUMBAR DISC SURGERY  12/2009   REPLACEMENT TOTAL KNEE Right 06/2007   DR. HOOTEN, [facility]   REPLACEMENT UNICONDYLAR JOINT KNEE     DR. HOOTEN, [facility]   RIGHT/LEFT HEART CATH AND CORONARY ANGIOGRAPHY N/A 09/29/2022   Procedure: RIGHT/LEFT HEART CATH AND CORONARY ANGIOGRAPHY;  Surgeon: Darron Deatrice LABOR, MD;  Location: [facility];  Service: Cardiovascular;  Laterality: N/A;   SHOULDER ARTHROSCOPY WITH ROTATOR CUFF REPAIR Right    TONSILLECTOMY     TOTAL HIP ARTHROPLASTY Right 01/31/2017   Procedure: TOTAL HIP ARTHROPLASTY;  Surgeon: Mardee Lynwood SQUIBB, MD;  Location: [facility] ORS;  Service: Orthopedics;  Laterality: Right;   TUBAL LIGATION     UPPER GI ENDOSCOPY     VENTRAL HERNIA REPAIR N/A 09/04/2023   Procedure:  REPAIR, HERNIA, VENTRAL;  Surgeon: Marinda Jayson KIDD, MD;  Location: [facility] ORS;  Service: General;  Laterality: N/A;   Social History:  reports that she has never smoked. She has never used smokeless tobacco. She reports that she does not drink alcohol and does not use drugs.  Allergies  Allergen Reactions   Amiodarone  Other (See Comments)    Fatigue, Nausea, anorexia, malaise   Lactose Other (See Comments) and Diarrhea   Hydrocodone -Acetaminophen  Itching   Other Other (See Comments)    Pt ONLY tolerates SLOW-FE (slow released iron )---upsets IBS   Biaxin [Clarithromycin] Other (See Comments)    GI upset    Hydrocodone -Acetaminophen  Itching    Tolerates acetaminophen   Oxycodone  Itching   Sulfa Antibiotics Diarrhea and Other (See Comments)    GI upset   Sulfasalazine Diarrhea    Family History  Problem Relation Age of Onset   Asthma Mother    Breast cancer Mother 35   Emphysema Father        smoked   Lung cancer Father        smoked   Coronary artery disease Father 33    Prior to Admission medications   Medication Sig Start Date End Date Taking? Authorizing Provider  acetaminophen  (TYLENOL ) 500 MG tablet Take 1,000 mg by mouth 3 (three) times daily as needed for mild pain.     [provider]  albuterol  (VENTOLIN  HFA) 108 (90 Base) MCG/ACT inhaler Inhale 1-2 puffs into the lungs every 6 (six) hours as needed for wheezing or shortness of breath.    [provider]  aspirin  EC 81 MG tablet Take 81 mg by mouth daily. Swallow whole.    [provider]  budesonide -formoterol  (SYMBICORT ) 160-4.5 MCG/ACT inhaler Inhale 2 puffs into the lungs daily. 07/09/23 07/08/24  [provider]  Cholecalciferol  (VITAMIN D ) 2000 units tablet Take 2,000 Units by mouth daily. Patient not taking: Reported on 12/12/2023    [provider]  clopidogrel  (PLAVIX ) 75 MG tablet TAKE 1 TABLET BY MOUTH DAILY WITH BREAKFAST. 06/08/23   Vivienne Lonni Ingle, NP   Cyanocobalamin 1000 MCG/ML KIT Inject as directed every 30 (thirty) days.    [provider]  dexamethasone  (DECADRON ) 4 MG tablet Take 4 mg by mouth See admin instructions. Tapered dose; 2mg  once daily until 12/05 11/13/23 12/23/23  [provider]  diltiazem  (CARDIZEM  CD) 180 MG 24 hr capsule Take 1 capsule (180 mg total) by mouth in the morning and at bedtime. 06/27/23   Cindie Ole DASEN, MD  Dupilumab 300 MG/2ML SOPN Inject 4 mLs into the skin every 14 (fourteen) days. 12/15/21   [provider]  EPINEPHrine  0.3 mg/0.3 mL IJ SOAJ injection Inject 0.3 mg into the muscle as needed. Patient not taking: Reported on 12/12/2023 11/29/21   [provider]  famotidine  (PEPCID ) 20 MG tablet Take 20 mg by mouth daily as needed.    [provider]  fluconazole (DIFLUCAN) 150 MG tablet Take 150 mg by mouth once. Patient not taking: Reported on 12/12/2023 11/29/23   [provider]  Fluticasone -Umeclidin-Vilant 100-62.5-25 MCG/ACT AEPB Inhale into the lungs. Inhale 1 Puff into the lungs once daily Patient not taking: Reported on 11/30/2023 12/12/22   [provider]  gabapentin  (NEURONTIN ) 300 MG capsule Take 300 mg by mouth at bedtime.    [provider]  HYDROcodone -acetaminophen  (NORCO/VICODIN) 5-325 MG tablet Take 1 tablet by mouth every 4 (four) hours as needed. 12/22/23   Dorothyann Drivers, MD  ipratropium (ATROVENT HFA) 17 MCG/ACT inhaler Inhale 2 puffs into the lungs 4 (four) times daily as needed. Patient not taking: Reported on 12/12/2023 11/29/23 11/28/24  [provider]  ipratropium (ATROVENT) 0.03 % nasal spray Place 1-2 sprays into both nostrils daily. 11/20/23 11/19/24  [provider]  levalbuterol  (XOPENEX ) 0.63 MG/3ML nebulizer solution Take 0.63 mg by nebulization every 6 (six) hours as needed for wheezing. 04/25/21   [provider]  meclizine (ANTIVERT) 12.5 MG tablet Take 12.5 mg by mouth  daily as needed for dizziness or nausea. 10/05/18   [provider]  montelukast  (SINGULAIR ) 10 MG tablet Take 10 mg by mouth daily.    [provider]  Multiple Vitamins-Minerals (  PRESERVISION AREDS 2 PO) Take 1 tablet by mouth in the morning and at bedtime. Sometimes takes 2 at the same time    [provider]  nystatin  (MYCOSTATIN ) 100000 UNIT/ML suspension Take 5 mLs (500,000 Units total) by mouth 4 (four) times daily. Patient not taking: Reported on 11/30/2023 10/04/23   Marinda Jayson KIDD, MD  ondansetron  (ZOFRAN -ODT) 4 MG disintegrating tablet Take 1 tablet (4 mg total) by mouth every 8 (eight) hours as needed for nausea or vomiting. 12/22/23   Dorothyann Drivers, MD  pantoprazole  (PROTONIX ) 40 MG tablet Take 40 mg by mouth daily.    [provider]  predniSONE  (DELTASONE ) 2.5 MG tablet Take 2 tablets (5 mg total) by mouth daily with breakfast. Resume after finishing high doses of prednisone  Patient not taking: Reported on 11/30/2023 04/27/23   Amin, Sumayya, MD  sertraline  (ZOLOFT ) 50 MG tablet Take 50 mg by mouth daily.    [provider]  Simethicone  180 MG CAPS Take 1-2 capsules by mouth as needed.    [provider]  triamcinolone  (NASACORT ) 55 MCG/ACT AERO nasal inhaler Place 1 spray into the nose daily.    [provider]    Physical Exam: Vitals:   12/23/23 1712 12/23/23 1716  BP: (!) 133/102   Pulse: (!) 110   Resp: (!) 28   Temp: 98 F (36.7 C)   TempSrc: Oral   SpO2: 98%   Weight:  90.7 kg  Height:  5' 4 (1.626 m)   Physical Exam Vitals and nursing note reviewed.  Constitutional:      General: She is not in acute distress.    Comments: In pain discomfort , holding left chest under breast  HENT:     Head: Normocephalic and atraumatic.  Cardiovascular:     Rate and Rhythm: Regular rhythm. Tachycardia present.     Heart sounds: Normal heart sounds.  Pulmonary:     Effort: Tachypnea present.     Breath  sounds: Normal breath sounds.  Abdominal:     Palpations: Abdomen is soft.     Tenderness: There is no abdominal tenderness.     Comments: Small bruise LUQ abdominal wall  Neurological:     Mental Status: Mental status is at baseline.     Labs on Admission: I have personally reviewed following labs and imaging studies  CBC: Recent Labs  Lab 12/22/23 1606 12/23/23 1719  WBC 8.3 7.9  HGB 13.0 12.9  HCT 39.0 37.8  MCV 100.3* 98.2  PLT 142* 143*   Basic Metabolic Panel: Recent Labs  Lab 12/22/23 1606 12/23/23 1719  NA 132* 131*  K 4.2 4.5  CL 95* 93*  CO2 26 27  GLUCOSE 117* 93  BUN 17 14  CREATININE 1.41* 1.20*  CALCIUM  8.7* 9.0   GFR: Estimated Creatinine Clearance: 43.5 mL/min (A) (by C-G formula based on SCr of 1.2 mg/dL (H)). Liver Function Tests: Recent Labs  Lab 12/22/23 1606 12/23/23 1719  AST 25 28  ALT 24 25  ALKPHOS 88 91  BILITOT 0.7 0.7  PROT 5.8* 6.1*  ALBUMIN 3.6 3.8   No results for input(s): LIPASE, AMYLASE in the last 168 hours. No results for input(s): AMMONIA in the last 168 hours. Coagulation Profile: Recent Labs  Lab 12/23/23 1939  INR 1.0   Cardiac Enzymes: No results for input(s): CKTOTAL, CKMB, CKMBINDEX, TROPONINI in the last 168 hours. BNP (last 3 results) Recent Labs    12/23/23 1719  PROBNP 272.0   HbA1C: No results  for input(s): HGBA1C in the last 72 hours. CBG: No results for input(s): GLUCAP in the last 168 hours. Lipid Profile: No results for input(s): CHOL, HDL, LDLCALC, TRIG, CHOLHDL, LDLDIRECT in the last 72 hours. Thyroid  Function Tests: No results for input(s): TSH, T4TOTAL, FREET4, T3FREE, THYROIDAB in the last 72 hours. Anemia Panel: No results for input(s): VITAMINB12, FOLATE, FERRITIN, TIBC, IRON , RETICCTPCT in the last 72 hours. Urine analysis:    Component Value Date/Time   COLORURINE YELLOW (A) 06/13/2023 2358   APPEARANCEUR CLEAR (A) 06/13/2023  2358   LABSPEC 1.042 (H) 06/13/2023 2358   PHURINE 5.0 06/13/2023 2358   GLUCOSEU NEGATIVE 06/13/2023 2358   HGBUR NEGATIVE 06/13/2023 2358   BILIRUBINUR NEGATIVE 06/13/2023 2358   KETONESUR NEGATIVE 06/13/2023 2358   PROTEINUR NEGATIVE 06/13/2023 2358   NITRITE NEGATIVE 06/13/2023 2358   LEUKOCYTESUR TRACE (A) 06/13/2023 2358    Radiological Exams on Admission: CT Angio Chest PE W and/or Wo Contrast Result Date: 12/23/2023 CLINICAL DATA:  Clemens yesterday, nausea and vomiting, weakness, increasing shortness of breath EXAM: CT ANGIOGRAPHY CHEST CT ABDOMEN AND PELVIS WITH CONTRAST TECHNIQUE: Multidetector CT imaging of the chest was performed using the standard protocol during bolus administration of intravenous contrast. Multiplanar CT image reconstructions and MIPs were obtained to evaluate the vascular anatomy. Multidetector CT imaging of the abdomen and pelvis was performed using the standard protocol during bolus administration of intravenous contrast. RADIATION DOSE REDUCTION: This exam was performed according to the departmental dose-optimization program which includes automated exposure control, adjustment of the mA and/or kV according to patient size and/or use of iterative reconstruction technique. CONTRAST:  OMNIPAQUE  IOHEXOL  350 MG/ML SOLN COMPARISON:  12/23/2023, 11/21/2023, 09/04/2023 FINDINGS: CTA CHEST FINDINGS Cardiovascular: This is a technically adequate evaluation of the pulmonary vasculature. Right-sided central and lobar pulmonary emboli are identified. No evidence of right heart strain. Lipomatous hypertrophy of the interatrial septum is again noted. No pericardial effusion. Normal caliber of the thoracic aorta. Stable atherosclerosis of the aorta and coronary vasculature. Mediastinum/Nodes: Stable appearance of the thyroid . Trachea and esophagus are unremarkable. No pathologic adenopathy. Lungs/Pleura: Trace left pleural effusion. No acute airspace disease or pneumothorax.  Dependent hypoventilatory changes are seen within the lower lobes. Musculoskeletal: No acute or destructive bony abnormalities. Reconstructed images demonstrate no additional findings. Review of the MIP images confirms the above findings. CT ABDOMEN and PELVIS FINDINGS Hepatobiliary: No focal liver abnormality is seen. No gallstones, gallbladder wall thickening, or biliary dilatation. Pancreas: Unremarkable. No pancreatic ductal dilatation or surrounding inflammatory changes. Spleen: Normal in size without focal abnormality. Adrenals/Urinary Tract: Bilateral renal cortical atrophy again noted. No abnormal renal enhancement. No urinary tract calculi or obstructive uropathy. The adrenals are stable. Bladder is decompressed, limiting its evaluation. Stomach/Bowel: No bowel obstruction or ileus. Normal appendix right lower quadrant. Sigmoid diverticulosis without evidence of acute diverticulitis. No bowel wall thickening or inflammatory change. Vascular/Lymphatic: No significant vascular findings are present. No enlarged abdominal or pelvic lymph nodes. Reproductive: Uterus and bilateral adnexa are unremarkable. Other: No free fluid or free intraperitoneal gas. No abdominal wall hernia. Musculoskeletal: Unremarkable right hip arthroplasty. There are no acute or destructive bony abnormalities. Reconstructed images demonstrate no additional findings. Review of the MIP images confirms the above findings. IMPRESSION: Chest: 1. Right-sided central and segmental pulmonary emboli. No right heart strain. 2. Trace left pleural effusion. 3. Aortic Atherosclerosis (ICD10-I70.0). Coronary artery atherosclerosis. Abdomen/pelvis: 1. No acute intra-abdominal or intrapelvic process. 2. Sigmoid diverticulosis without diverticulitis. Critical Value/emergent results were called by telephone at the  time of interpretation on 12/23/2023 at 7:09 pm to provider PHILLIP STAFFORD , who verbally acknowledged these results. Electronically Signed    By: Ozell Daring M.D.   On: 12/23/2023 19:12   CT ABDOMEN PELVIS W CONTRAST Result Date: 12/23/2023 CLINICAL DATA:  Clemens yesterday, nausea and vomiting, weakness, increasing shortness of breath EXAM: CT ANGIOGRAPHY CHEST CT ABDOMEN AND PELVIS WITH CONTRAST TECHNIQUE: Multidetector CT imaging of the chest was performed using the standard protocol during bolus administration of intravenous contrast. Multiplanar CT image reconstructions and MIPs were obtained to evaluate the vascular anatomy. Multidetector CT imaging of the abdomen and pelvis was performed using the standard protocol during bolus administration of intravenous contrast. RADIATION DOSE REDUCTION: This exam was performed according to the departmental dose-optimization program which includes automated exposure control, adjustment of the mA and/or kV according to patient size and/or use of iterative reconstruction technique. CONTRAST:  OMNIPAQUE  IOHEXOL  350 MG/ML SOLN COMPARISON:  12/23/2023, 11/21/2023, 09/04/2023 FINDINGS: CTA CHEST FINDINGS Cardiovascular: This is a technically adequate evaluation of the pulmonary vasculature. Right-sided central and lobar pulmonary emboli are identified. No evidence of right heart strain. Lipomatous hypertrophy of the interatrial septum is again noted. No pericardial effusion. Normal caliber of the thoracic aorta. Stable atherosclerosis of the aorta and coronary vasculature. Mediastinum/Nodes: Stable appearance of the thyroid . Trachea and esophagus are unremarkable. No pathologic adenopathy. Lungs/Pleura: Trace left pleural effusion. No acute airspace disease or pneumothorax. Dependent hypoventilatory changes are seen within the lower lobes. Musculoskeletal: No acute or destructive bony abnormalities. Reconstructed images demonstrate no additional findings. Review of the MIP images confirms the above findings. CT ABDOMEN and PELVIS FINDINGS Hepatobiliary: No focal liver abnormality is seen. No gallstones,  gallbladder wall thickening, or biliary dilatation. Pancreas: Unremarkable. No pancreatic ductal dilatation or surrounding inflammatory changes. Spleen: Normal in size without focal abnormality. Adrenals/Urinary Tract: Bilateral renal cortical atrophy again noted. No abnormal renal enhancement. No urinary tract calculi or obstructive uropathy. The adrenals are stable. Bladder is decompressed, limiting its evaluation. Stomach/Bowel: No bowel obstruction or ileus. Normal appendix right lower quadrant. Sigmoid diverticulosis without evidence of acute diverticulitis. No bowel wall thickening or inflammatory change. Vascular/Lymphatic: No significant vascular findings are present. No enlarged abdominal or pelvic lymph nodes. Reproductive: Uterus and bilateral adnexa are unremarkable. Other: No free fluid or free intraperitoneal gas. No abdominal wall hernia. Musculoskeletal: Unremarkable right hip arthroplasty. There are no acute or destructive bony abnormalities. Reconstructed images demonstrate no additional findings. Review of the MIP images confirms the above findings. IMPRESSION: Chest: 1. Right-sided central and segmental pulmonary emboli. No right heart strain. 2. Trace left pleural effusion. 3. Aortic Atherosclerosis (ICD10-I70.0). Coronary artery atherosclerosis. Abdomen/pelvis: 1. No acute intra-abdominal or intrapelvic process. 2. Sigmoid diverticulosis without diverticulitis. Critical Value/emergent results were called by telephone at the time of interpretation on 12/23/2023 at 7:09 pm to provider PHILLIP STAFFORD , who verbally acknowledged these results. Electronically Signed   By: Ozell Daring M.D.   On: 12/23/2023 19:12   CT Head Wo Contrast Result Date: 12/23/2023 EXAM: CT HEAD WITHOUT CONTRAST 12/23/2023 06:48:03 PM TECHNIQUE: CT of the head was performed without the administration of intravenous contrast. Automated exposure control, iterative reconstruction, and/or weight based adjustment of the  mA/kV was utilized to reduce the radiation dose to as low as reasonably achievable. COMPARISON: None available. CLINICAL HISTORY: Head trauma, minor (Age >= 65y) FINDINGS: BRAIN AND VENTRICLES: No acute hemorrhage. No evidence of acute infarct. No hydrocephalus. No extra-axial collection. No mass effect or midline shift. 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 Patchy white matter hypodensities are compatible with chronic microvascular ischemic change. Remote right thalamic lacunar infarct. ORBITS: No acute abnormality. SINUSES: No acute abnormality. SOFT TISSUES AND SKULL: No acute soft tissue abnormality. No skull fracture. IMPRESSION: 1. No acute intracranial abnormality. 2. 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 Electronically signed by: Gilmore Molt MD 12/23/2023 06:58 PM EST RP Workstation: [id number]   DG Chest 2 View Result Date: 12/23/2023 EXAM: 2 VIEW(S) XRAY OF THE CHEST 12/23/2023 05:52:00 PM COMPARISON: Yesterday. CLINICAL HISTORY: SOB. FINDINGS: LUNGS AND PLEURA: No focal pulmonary opacity. No pleural effusion. No pneumothorax. HEART AND MEDIASTINUM: Stable cardiomediastinal silhouette. BONES AND SOFT TISSUES: No acute osseous abnormality. IMPRESSION: 1. No acute cardiopulmonary process. Electronically signed by: Lynwood Seip MD 12/23/2023 06:07 PM EST RP Workstation: [id number]   DG Ribs Unilateral W/Chest Left Result Date: 12/22/2023 CLINICAL DATA:  Clemens, rib pain EXAM: LEFT RIBS AND CHEST - 3+ VIEW COMPARISON:  06/14/2023 FINDINGS: Frontal view of the chest as well as frontal and oblique views of the left thoracic cage are obtained. Stable enlargement of the cardiac silhouette. Mild vascular congestion without airspace disease, effusion, or pneumothorax. No acute displaced fractures. IMPRESSION: 1. Enlarged cardiac silhouette, with mild chronic pulmonary  vascular congestion. 2. No acute intrathoracic process. 3. No displaced fracture. Electronically Signed   By: Ozell Daring M.D.   On: 12/22/2023 16:49   Data Reviewed for HPI: Relevant notes from primary care and specialist visits, past discharge summaries as available in EHR, including Care Everywhere. Prior diagnostic testing as pertinent to current admission diagnoses Updated medications and problem lists for reconciliation ED course, including vitals, labs, imaging, treatment and response to treatment Triage notes, nursing and pharmacy notes and ED provider's notes Notable results as noted above in HPI      Assessment and Plan: * Acute pulmonary embolism, recurrent (HCC) History of prior PEs, off anticoagulation x 1 year -Patient  dyspneic tachycardic and tachypneic in the setting of being off AC x 1 year to decrease bleeding risk with concomitant DAPT -CTA chest showing central and segmental right-sided PE with trace left pleural effusion -Continue heparin  and transition to oral in a day -Patient will likely need lifelong anticoagulation -Currently on dual antiplatelet therapy with clopidogrel  and Ranexa-Will hold tonight to decrease bleeding risk, pending cardiology input -# Supplemental oxygen if needed   Nausea and vomiting History of erosive gastritis (EGD 2023 with biopsy) Etiology uncertain, gastritis EGD 2023: Biopsies were taken-showed reflux, reactive and healing erosive gastritis. Antiemetics, IV hydration, IV Protonix  CT abdomen and pelvis unrevealing Supportive care Will keep n.p.o. tonight Will get GI consult to evaluate for EGD-family very concerned  Contusion of chest wall, left, subsequent encounter History of fall off couch hitting left chest and abdomen Patient with pain and discomfort left upper abdomen , left lateral ribs below left breast As needed pain meds  CAD S/P percutaneous coronary angioplasty Troponin slightly elevated but flat in past 24  hours: 40-38-35 Continue GDMT-holding antiplatelets for now Has been on DAPT since stent 09/2022 Can Consider cardiology input for decision on resumption of antiplatelet of choice  Rheumatoid arthritis (HCC) On chronic immunosuppressive therapy Will hold meds while in the house  Chronic kidney disease, stage 3a (HCC) Renal function at baseline  HTN (hypertension) Continue home meds  COPD (chronic obstructive pulmonary disease) (HCC) Not acutely exacerbated Continue home inhalers  PSVT (paroxysmal supraventricular tachycardia) No acute issues suspected Continuous  cardiac monitoring  Chronic venous insufficiency No acute issues Can consider lower extremity venous ultrasound    DVT prophylaxis: heparin   Consults: none  Advance Care Planning:   Code Status: Prior   Family Communication: daughter at bedside  Disposition Plan: Back to previous home environment  Severity of Illness: The appropriate patient status for this patient is OBSERVATION. Observation status is judged to be reasonable and necessary in order to provide the required intensity of service to ensure the patient's safety. The patient's presenting symptoms, physical exam findings, and initial radiographic and laboratory data in the context of their medical condition is felt to place them at decreased risk for further clinical deterioration. Furthermore, it is anticipated that the patient will be medically stable for discharge from the hospital within 2 midnights of admission.   Author: Delayne LULLA Solian, MD 12/23/2023 8:44 PM  For on call review [email or web address].

## 2023-12-23 NOTE — Assessment & Plan Note (Signed)
 No acute issues Can consider lower extremity venous ultrasound

## 2023-12-23 NOTE — Assessment & Plan Note (Signed)
 On chronic immunosuppressive therapy Will hold meds while in the house

## 2023-12-23 NOTE — Assessment & Plan Note (Signed)
 History of fall off couch hitting left chest and abdomen(PE is on right) As needed pain meds

## 2023-12-24 ENCOUNTER — Ambulatory Visit: Admitting: Cardiology

## 2023-12-24 DIAGNOSIS — S20212D Contusion of left front wall of thorax, subsequent encounter: Secondary | ICD-10-CM | POA: Diagnosis not present

## 2023-12-24 DIAGNOSIS — I471 Supraventricular tachycardia, unspecified: Secondary | ICD-10-CM | POA: Diagnosis not present

## 2023-12-24 DIAGNOSIS — K219 Gastro-esophageal reflux disease without esophagitis: Secondary | ICD-10-CM | POA: Diagnosis present

## 2023-12-24 DIAGNOSIS — R0609 Other forms of dyspnea: Secondary | ICD-10-CM | POA: Diagnosis not present

## 2023-12-24 DIAGNOSIS — N1831 Chronic kidney disease, stage 3a: Secondary | ICD-10-CM | POA: Diagnosis not present

## 2023-12-24 DIAGNOSIS — E662 Morbid (severe) obesity with alveolar hypoventilation: Secondary | ICD-10-CM | POA: Diagnosis present

## 2023-12-24 DIAGNOSIS — I25118 Atherosclerotic heart disease of native coronary artery with other forms of angina pectoris: Secondary | ICD-10-CM | POA: Diagnosis not present

## 2023-12-24 DIAGNOSIS — E871 Hypo-osmolality and hyponatremia: Secondary | ICD-10-CM | POA: Diagnosis present

## 2023-12-24 DIAGNOSIS — R112 Nausea with vomiting, unspecified: Secondary | ICD-10-CM | POA: Diagnosis not present

## 2023-12-24 DIAGNOSIS — I2699 Other pulmonary embolism without acute cor pulmonale: Secondary | ICD-10-CM | POA: Diagnosis not present

## 2023-12-24 DIAGNOSIS — Z9861 Coronary angioplasty status: Secondary | ICD-10-CM | POA: Diagnosis not present

## 2023-12-24 DIAGNOSIS — F419 Anxiety disorder, unspecified: Secondary | ICD-10-CM | POA: Diagnosis present

## 2023-12-24 DIAGNOSIS — I5032 Chronic diastolic (congestive) heart failure: Secondary | ICD-10-CM | POA: Diagnosis present

## 2023-12-24 DIAGNOSIS — I10 Essential (primary) hypertension: Secondary | ICD-10-CM

## 2023-12-24 DIAGNOSIS — J441 Chronic obstructive pulmonary disease with (acute) exacerbation: Secondary | ICD-10-CM | POA: Diagnosis present

## 2023-12-24 DIAGNOSIS — K279 Peptic ulcer, site unspecified, unspecified as acute or chronic, without hemorrhage or perforation: Secondary | ICD-10-CM | POA: Diagnosis present

## 2023-12-24 DIAGNOSIS — I130 Hypertensive heart and chronic kidney disease with heart failure and stage 1 through stage 4 chronic kidney disease, or unspecified chronic kidney disease: Secondary | ICD-10-CM | POA: Diagnosis present

## 2023-12-24 DIAGNOSIS — I2609 Other pulmonary embolism with acute cor pulmonale: Secondary | ICD-10-CM | POA: Diagnosis not present

## 2023-12-24 DIAGNOSIS — E66811 Obesity, class 1: Secondary | ICD-10-CM | POA: Diagnosis present

## 2023-12-24 DIAGNOSIS — M069 Rheumatoid arthritis, unspecified: Secondary | ICD-10-CM | POA: Diagnosis present

## 2023-12-24 DIAGNOSIS — Y92009 Unspecified place in unspecified non-institutional (private) residence as the place of occurrence of the external cause: Secondary | ICD-10-CM | POA: Diagnosis not present

## 2023-12-24 DIAGNOSIS — R000 Tachycardia, unspecified: Secondary | ICD-10-CM

## 2023-12-24 DIAGNOSIS — Z86711 Personal history of pulmonary embolism: Secondary | ICD-10-CM | POA: Diagnosis not present

## 2023-12-24 DIAGNOSIS — D509 Iron deficiency anemia, unspecified: Secondary | ICD-10-CM | POA: Diagnosis present

## 2023-12-24 DIAGNOSIS — Z7902 Long term (current) use of antithrombotics/antiplatelets: Secondary | ICD-10-CM | POA: Diagnosis not present

## 2023-12-24 DIAGNOSIS — I2510 Atherosclerotic heart disease of native coronary artery without angina pectoris: Secondary | ICD-10-CM | POA: Diagnosis not present

## 2023-12-24 DIAGNOSIS — I4719 Other supraventricular tachycardia: Secondary | ICD-10-CM | POA: Diagnosis present

## 2023-12-24 DIAGNOSIS — I2489 Other forms of acute ischemic heart disease: Secondary | ICD-10-CM | POA: Diagnosis present

## 2023-12-24 DIAGNOSIS — E785 Hyperlipidemia, unspecified: Secondary | ICD-10-CM | POA: Diagnosis present

## 2023-12-24 DIAGNOSIS — I872 Venous insufficiency (chronic) (peripheral): Secondary | ICD-10-CM | POA: Diagnosis present

## 2023-12-24 DIAGNOSIS — S20219S Contusion of unspecified front wall of thorax, sequela: Secondary | ICD-10-CM | POA: Diagnosis not present

## 2023-12-24 DIAGNOSIS — Z7901 Long term (current) use of anticoagulants: Secondary | ICD-10-CM | POA: Diagnosis not present

## 2023-12-24 DIAGNOSIS — F32A Depression, unspecified: Secondary | ICD-10-CM | POA: Diagnosis present

## 2023-12-24 DIAGNOSIS — W08XXXD Fall from other furniture, subsequent encounter: Secondary | ICD-10-CM | POA: Diagnosis present

## 2023-12-24 DIAGNOSIS — I700 Atherosclerosis of aorta: Secondary | ICD-10-CM | POA: Diagnosis present

## 2023-12-24 DIAGNOSIS — J432 Centrilobular emphysema: Secondary | ICD-10-CM | POA: Diagnosis not present

## 2023-12-24 DIAGNOSIS — J9811 Atelectasis: Secondary | ICD-10-CM | POA: Diagnosis present

## 2023-12-24 DIAGNOSIS — D849 Immunodeficiency, unspecified: Secondary | ICD-10-CM | POA: Diagnosis present

## 2023-12-24 LAB — CBC
HCT: 33.3 % — ABNORMAL LOW (ref 36.0–46.0)
Hemoglobin: 11.1 g/dL — ABNORMAL LOW (ref 12.0–15.0)
MCH: 32.9 pg (ref 26.0–34.0)
MCHC: 33.3 g/dL (ref 30.0–36.0)
MCV: 98.8 fL (ref 80.0–100.0)
Platelets: 147 K/uL — ABNORMAL LOW (ref 150–400)
RBC: 3.37 MIL/uL — ABNORMAL LOW (ref 3.87–5.11)
RDW: 13.9 % (ref 11.5–15.5)
WBC: 6.7 K/uL (ref 4.0–10.5)
nRBC: 0 % (ref 0.0–0.2)

## 2023-12-24 LAB — HEPARIN LEVEL (UNFRACTIONATED)
Heparin Unfractionated: 0.46 [IU]/mL (ref 0.30–0.70)
Heparin Unfractionated: 0.39 [IU]/mL (ref 0.30–0.70)

## 2023-12-24 LAB — APTT: aPTT: 65 s — ABNORMAL HIGH (ref 24–36)

## 2023-12-24 MED ORDER — DILTIAZEM HCL ER COATED BEADS 180 MG PO CP24: 180.0000 mg | ORAL_CAPSULE | Freq: Every day | ORAL | Status: DC

## 2023-12-24 MED ORDER — SERTRALINE HCL 50 MG PO TABS
50.0000 mg | ORAL_TABLET | Freq: Every day | ORAL | Status: DC
  Administered 2023-12-24 – 2023-12-28 (×5): 50 mg via ORAL
  Filled 2023-12-24 (×5): qty 1

## 2023-12-24 MED ORDER — LORAZEPAM 2 MG/ML IJ SOLN
1.0000 mg | Freq: Four times a day (QID) | INTRAMUSCULAR | Status: DC | PRN
  Administered 2023-12-24: 1 mg via INTRAVENOUS
  Filled 2023-12-24: qty 1

## 2023-12-24 MED ORDER — DILTIAZEM HCL ER COATED BEADS 180 MG PO CP24
180.0000 mg | ORAL_CAPSULE | Freq: Every day | ORAL | Status: DC
  Administered 2023-12-24 – 2023-12-25 (×2): 180 mg via ORAL
  Filled 2023-12-24 (×2): qty 1

## 2023-12-24 MED ORDER — SERTRALINE HCL 50 MG PO TABS: 50.0000 mg | ORAL_TABLET | Freq: Every day | ORAL | Status: DC

## 2023-12-24 MED ORDER — FAMOTIDINE 20 MG PO TABS: 20.0000 mg | ORAL_TABLET | Freq: Every day | ORAL | Status: DC | PRN

## 2023-12-24 MED ORDER — ORAL CARE MOUTH RINSE: 15.0000 mL | OROMUCOSAL | Status: DC | PRN

## 2023-12-24 MED ORDER — METOPROLOL TARTRATE 5 MG/5ML IV SOLN
5.0000 mg | Freq: Four times a day (QID) | INTRAVENOUS | Status: DC | PRN
  Filled 2023-12-24: qty 5

## 2023-12-24 MED ORDER — GABAPENTIN 300 MG PO CAPS
300.0000 mg | ORAL_CAPSULE | Freq: Every evening | ORAL | Status: AC | PRN
  Administered 2023-12-24 – 2023-12-27 (×4): 300 mg via ORAL
  Filled 2023-12-24 (×4): qty 1

## 2023-12-24 MED ORDER — PANTOPRAZOLE SODIUM 40 MG PO TBEC
40.0000 mg | DELAYED_RELEASE_TABLET | Freq: Every day | ORAL | Status: DC
  Administered 2023-12-24 – 2023-12-27 (×4): 40 mg via ORAL
  Filled 2023-12-24 (×4): qty 1

## 2023-12-24 NOTE — Progress Notes (Signed)
 PROGRESS NOTE    Courtney Wilcox  FMW:[id number] DOB: 11-13-1945 DOA: 12/23/2023 PCP: Marikay Eva POUR, PA  [id number]  LOS: 0 days   Brief hospital course:   Assessment & Plan: Courtney Wilcox is a 78 y.o. female with medical history significant for HTN, CAD s/p stent (09/2022), rheumatoid arthritis on chronic immunosuppressants and prednisone , CKD llla, SVT, COPD, OSA, prior recurrent PE (off Eliquis  x 1 year due to being on DAPT), being admitted with a right sided pulmonary embolism(central and segmental).  History provided by the daughter who states that for the past week she has been feeling very weak and having nausea, in the setting of a 70-month history of ongoing shortness of breath currently being evaluated by pulmonology and cardiology.  At baseline she is independent but about 4 days ago she fell forward onto her face and left side while she was trying to sit on a couch and since then her condition seem to worsen.  She developed worsening shortness of breath and also had left-sided chest pain for which she was seen in the ED on 12/22/2023.  Last night she developed vomiting which she attributed to the pain medicine and due to feeling poorly she requested to come back to the emergency room.  Of note, patient was evaluated with a CTA chest a month ago  was negative for PE and she has an upcoming LHC/RHC to further evaluate for pulmonary hypertension due to months of shortness of breath.  She is currently completing a tapering dexamethasone  pack ordered by her pulmonologist prior to going back on her daily prednisone  5 mg..    * Acute pulmonary embolism, recurrent (HCC) History of prior PE's -Patient  dyspneic tachycardic and tachypneic in the setting of being off AC x 1 year to decrease bleeding risk with concomitant DAPT -CTA chest showing central and segmental right-sided PE with trace left pleural effusion --cont heparin  gtt  Nausea and vomiting History of erosive gastritis  (EGD 2023 with biopsy) Etiology uncertain, gastritis EGD 2023: Biopsies were taken-showed reflux, reactive and healing erosive gastritis. CT abdomen and pelvis unrevealing --GI consult today, family would like EGD  Dyspnea --being worked up by outpatient cardio and pulm --consult cardio and pulm, per daughters' request  Contusion of chest wall, left, subsequent encounter History of fall off couch hitting left chest and abdomen --no rib fracture --Norco PRN  CAD S/P percutaneous coronary angioplasty Has been on DAPT since stent 09/2022 --consult cardiology for decision on resumption of antiplatelet of choice  Rheumatoid arthritis (HCC) On chronic immunosuppressive therapy Will hold meds while in the house  Chronic kidney disease, stage 3a (HCC) Renal function at baseline  HTN (hypertension) --cont Cardizem   COPD (chronic obstructive pulmonary disease) (HCC) Not acutely exacerbated --cont bronchodilators  PSVT (paroxysmal supraventricular tachycardia) No acute issues suspected Continuous cardiac monitoring  Chronic venous insufficiency No acute issues   DVT prophylaxis: On:heparin  gtt Code Status: Full code  Family Communication: daughter updated at bedside today Level of care: Progressive Dispo:   The patient is from: home Anticipated d/c is to: home Anticipated d/c date is: 1-2 days   Subjective and Interval History:  Pt had nausea and anxiety today.   Objective: Vitals:   12/24/23 0404 12/24/23 0810 12/24/23 1100 12/24/23 1312  BP: (!) 175/100 (!) 176/106  (!) 117/101  Pulse: (!) 120 (!) 129 92 61  Resp: 20 (!) 21 20   Temp: 99 F (37.2 C) 98.7 F (37.1 C) 98.7 F (37.1 C)  TempSrc:   Oral   SpO2: 97% 98% 100% 100%  Weight:      Height:        Intake/Output Summary (Last 24 hours) at 12/24/2023 1930 Last data filed at 12/24/2023 1600 Gross per 24 hour  Intake 1522.48 ml  Output 700 ml  Net 822.48 ml   Filed Weights   12/23/23 1716   Weight: 90.7 kg    Examination:   Constitutional: NAD, somnolent from ativan  CV: No cyanosis.   RESP: normal respiratory effort   Data Reviewed: I have personally reviewed labs and imaging studies  Time spent: 50 minutes  Ellouise Haber, MD [facility] If 7PM-7AM, please contact night-coverage 12/24/2023, 7:30 PM

## 2023-12-24 NOTE — Progress Notes (Signed)
 Patients family member assisted patient to [facility] , patient noted to be tachypneic, and her HR was in the 120's to 130s sustaining , patient helped back to bed , her BP was also noted to be elevated, patient given PRN morphine  for pain, and IV metoprolol  5mg  to lower HR , patient anxious , provided positive reassurance and educated family member to use call light so staff can assist her to [facility] due to HR , bed alarm turned on , and nonskid socks placed on patients bilateral feet, her HR is now in the 110's . Patients oxygen placed on her and she is receiving a breathing treatment at this time.   12/24/23 0810  Assess: MEWS Score  Temp 98.7 F (37.1 C)  BP (!) 176/106  MAP (mmHg) 127  Pulse Rate (!) 129  Resp (!) 21  SpO2 98 %  O2 Device Nasal Cannula  O2 Flow Rate (L/min) 2 L/min  Assess: MEWS Score  MEWS Temp 0  MEWS Systolic 0  MEWS Pulse 2  MEWS RR 1  MEWS LOC 0  MEWS Score 3  MEWS Score Color Yellow  Assess: if the MEWS score is Yellow or Red  Were vital signs accurate and taken at a resting state? Yes  Does the patient meet 2 or more of the SIRS criteria? Yes  Does the patient have a confirmed or suspected source of infection? No  MEWS guidelines implemented  Yes, yellow  Treat  MEWS Interventions Considered administering scheduled or prn medications/treatments as ordered  Take Vital Signs  Increase Vital Sign Frequency  Yellow: Q2hr x1, continue Q4hrs until patient remains green for 12hrs  Escalate  MEWS: Escalate Yellow: Discuss with charge nurse and consider notifying provider and/or RRT  Notify: Charge Nurse/RN  Name of Charge Nurse/RN Notified Lauraine Coder, RN  Provider Notification  Provider Name/Title Awanda  Date Provider Notified 12/24/23  Time Provider Notified 0830  Method of Notification Page  Notification Reason New onset of dysrhythmia  Assess: SIRS CRITERIA  SIRS Temperature  0  SIRS Respirations  1  SIRS Pulse 1  SIRS WBC 0  SIRS Score Sum  2

## 2023-12-24 NOTE — Plan of Care (Signed)
  Problem: Education: Goal: Knowledge of General Education information will improve Description: Including pain rating scale, medication(s)/side effects and non-pharmacologic comfort measures Outcome: Progressing   Problem: Health Behavior/Discharge Planning: Goal: Ability to manage health-related needs will improve Outcome: Not Progressing   Problem: Clinical Measurements: Goal: Ability to maintain clinical measurements within normal limits will improve Outcome: Progressing Goal: Will remain free from infection Outcome: Progressing Goal: Respiratory complications will improve Outcome: Not Progressing Goal: Cardiovascular complication will be avoided Outcome: Progressing

## 2023-12-24 NOTE — Consult Note (Signed)
 PHARMACY - ANTICOAGULATION CONSULT NOTE  Pharmacy Consult for Heparin   Indication: pulmonary embolus  Allergies  Allergen Reactions   Amiodarone  Other (See Comments)    Fatigue, Nausea, anorexia, malaise   Lactose Other (See Comments) and Diarrhea   Hydrocodone -Acetaminophen  Itching   Other Other (See Comments)    Pt ONLY tolerates SLOW-FE (slow released iron )---upsets IBS   Biaxin [Clarithromycin] Other (See Comments)    GI upset    Hydrocodone -Acetaminophen  Itching    Tolerates acetaminophen     Oxycodone  Itching   Sulfa Antibiotics Diarrhea and Other (See Comments)    GI upset   Sulfasalazine Diarrhea    Patient Measurements: Height: 5' 4 (162.6 cm) Weight: 90.7 kg (199 lb 15.3 oz) IBW/kg (Calculated) : 54.7 HEPARIN  DW (KG): 75.1  Vital Signs: Temp: 99 F (37.2 C) (12/08 0404) Temp Source: Oral (12/07 2129) BP: 175/100 (12/08 0404) Pulse Rate: 120 (12/08 0404)  Labs: Recent Labs    12/22/23 1606 12/23/23 1719 12/23/23 1939 12/24/23 0520  HGB 13.0 12.9  --  11.1*  HCT 39.0 37.8  --  33.3*  PLT 142* 143*  --  147*  APTT  --   --  27 65*  LABPROT  --   --  13.3  --   INR  --   --  1.0  --   HEPARINUNFRC  --   --   --  0.46  CREATININE 1.41* 1.20*  --   --     Estimated Creatinine Clearance: 43.5 mL/min (A) (by C-G formula based on SCr of 1.2 mg/dL (H)).   Medical History: Past Medical History:  Diagnosis Date   Allergic rhinitis    Anxiety    Arthritis    Asthma    Atrophic vaginitis    B12 deficiency    Bronchitis, chronic (HCC)    CAD (coronary artery disease)    a. 03/2003 Cath: Nl cors; b. 09/2022 Staged PCI: LM nl, LAD 85p/66m (3.5x26 Onyx Frontier DES), RI small, mild dzs, LCX nl, OM2 mild dzs, RCA 30p.   Cervical disc disease    CKD (chronic kidney disease), stage III (HCC)    Collagen vascular disease    RA   COPD (chronic obstructive pulmonary disease) (HCC)    Cough - post-COVID    Depression    Diastolic dysfunction    a. 11/2020  Echo: EF 45-50%, GrI DD; b. 10/2021 Echo: EF 60-65%, GrII DD; c. 09/2022 Echo: EF 55-60%, no rwma, GrI DD, nl RV fxn, mildly dil LA. No significant valvular dzs; d. 04/2023 Echo: EF 60-65% no rwma, mild LVH, nl RV fxn, RVSP 23.62mmHg. AoV sclerosis.   Fibrocystic breast disease    Foot ulcer (HCC)    GERD (gastroesophageal reflux disease)    Hammer toe    Hyperlipidemia    IBS (irritable bowel syndrome)    Macrocytic anemia    Macular degeneration 2023   Nausea and vomiting 12/23/2023   Obesity    OSA (obstructive sleep apnea)    Osteoarthrosis    Multi site   Osteopenia    PSVT (paroxysmal supraventricular tachycardia)    a. 04/2022 [facility]: Sinus rhythm @ 57 (42-101). Rare PACs/PVCs. 74290 SVT runs lasting up to 52m 12sec - max rate 222. 5 beats NSVT. Triggered events = sinus, SVT, PACs.   Rheumatoid arthritis (HCC)    Shingles    Thyroid  nodule    MULTIPLE   Tonsillitis    recurrent   Umbilical hernia     Medications:  Medications  Prior to Admission  Medication Sig Dispense Refill Last Dose/Taking   acetaminophen  (TYLENOL ) 500 MG tablet Take 1,000 mg by mouth 3 (three) times daily as needed for mild pain.    12/23/2023 at  1:00 PM   albuterol  (VENTOLIN  HFA) 108 (90 Base) MCG/ACT inhaler Inhale 1-2 puffs into the lungs every 6 (six) hours as needed for wheezing or shortness of breath.   Unknown   aspirin  EC 81 MG tablet Take 81 mg by mouth daily. Swallow whole.   12/22/2023 at 12:00 PM   budesonide -formoterol  (SYMBICORT ) 160-4.5 MCG/ACT inhaler Inhale 2 puffs into the lungs daily.   12/22/2023 at  8:00 PM   clopidogrel  (PLAVIX ) 75 MG tablet TAKE 1 TABLET BY MOUTH DAILY WITH BREAKFAST. 90 tablet 3 12/22/2023 at  3:00 PM   Cyanocobalamin 1000 MCG/ML KIT Inject as directed every 30 (thirty) days.   11/30/2023   [EXPIRED] dexamethasone  (DECADRON ) 4 MG tablet Take 4 mg by mouth See admin instructions. Tapered dose; 2mg  once daily until 12/05   12/20/2023   diltiazem  (CARDIZEM  CD) 180 MG 24 hr  capsule Take 1 capsule (180 mg total) by mouth in the morning and at bedtime. 180 capsule 3 12/23/2023 at  3:00 PM   diphenhydrAMINE  (BENADRYL ) 25 mg capsule Take 25 mg by mouth every 6 (six) hours as needed for itching (Take with Hydrocoone/APAP for itching.).   12/23/2023 at  4:30 AM   Dupilumab 300 MG/2ML SOPN Inject 4 mLs into the skin every 14 (fourteen) days.   12/16/2023   EPINEPHrine  0.3 mg/0.3 mL IJ SOAJ injection Inject 0.3 mg into the muscle as needed.   Taking As Needed   famotidine  (PEPCID ) 20 MG tablet Take 20 mg by mouth daily as needed.   12/23/2023 at  1:00 PM   gabapentin  (NEURONTIN ) 100 MG capsule Take 100 mg by mouth at bedtime as needed.   Past Week   gabapentin  (NEURONTIN ) 300 MG capsule Take 300 mg by mouth at bedtime as needed.   Past Week   HYDROcodone -acetaminophen  (NORCO/VICODIN) 5-325 MG tablet Take 1 tablet by mouth every 4 (four) hours as needed. 15 tablet 0 12/23/2023 at  4:30 AM   ipratropium (ATROVENT) 0.03 % nasal spray Place 1-2 sprays into both nostrils daily.   Past Week   levalbuterol  (XOPENEX ) 0.63 MG/3ML nebulizer solution Take 0.63 mg by nebulization every 6 (six) hours as needed for wheezing.   12/23/2023 at  3:00 PM   meclizine (ANTIVERT) 12.5 MG tablet Take 12.5 mg by mouth daily as needed for dizziness or nausea.   Unknown   montelukast  (SINGULAIR ) 10 MG tablet Take 10 mg by mouth daily.   12/21/2023   Multiple Vitamins-Minerals (PRESERVISION AREDS 2 PO) Take 2 tablets by mouth daily. Sometimes takes 2 at the same time   12/17/2023   ondansetron  (ZOFRAN -ODT) 4 MG disintegrating tablet Take 1 tablet (4 mg total) by mouth every 8 (eight) hours as needed for nausea or vomiting. 20 tablet 0 12/23/2023 at  1:00 PM   pantoprazole  (PROTONIX ) 40 MG tablet Take 40 mg by mouth daily.   12/22/2023 at 11:00 PM   sertraline  (ZOLOFT ) 50 MG tablet Take 50 mg by mouth daily.   12/22/2023 at  3:00 PM   SIMETHICONE  PO Take 250 mg by mouth as needed.   12/23/2023 at  3:00 PM    triamcinolone  (NASACORT ) 55 MCG/ACT AERO nasal inhaler Place 1 spray into the nose daily.   12/22/2023 Noon   doxycycline  (VIBRA -TABS) 100 MG tablet Take  100 mg by mouth 2 (two) times daily.      Scheduled:  Infusions:  PRN:  Anti-infectives (From admission, onward)    None       Assessment: Pt presents to the ED after a fall and increasing SOB last few weeks. Hx of aflutter, SVT, total replacement of hip, CAD, and CKD. Head CT, no note of bleed. Chest CT: Right-sided central and segmental pulmonary emboli. No right heart strain. CBC stable. No note of DOAC PTA. Pharmacy consulted to start heparin  infusion for PE.   Goal of Therapy:  Heparin  level 0.3-0.7 units/ml Monitor platelets by anticoagulation protocol: Yes   Plan:  12/8:  HL @ 0520 = 0.46, therapeutic X 1 - Will continue pt on current rate and recheck HL in 8 hrs - CBC daily   Mikal Blasdell D, PharmD 12/24/2023,6:15 AM

## 2023-12-24 NOTE — Consult Note (Signed)
 Courtney Copping, MD [facility]  [address], [address] [place], [place] [address] Phone: [phone] Fax : [phone]  Consultation  Referring Provider:     Dr. Cleatus Primary Care Physician:  Marikay Eva POUR, [place] Primary Gastroenterologist:  Dr. Onita         Reason for Consultation:     Nausea and vomiting   Date of Admission:  12/23/2023 Date of Consultation:  12/24/2023         HPI:   Courtney Wilcox is a 78 y.o. female who has a history of GERD with coronary artery disease and chronic kidney disease chronic prednisone  use and rheumatoid arthritis with COPD who presented to the emergency department with chest pain and shortness of breath and had fallen with a left rib fracture the day prior to admission.  The patient was trying to walk to her recliner and was short of breath and bumped into the furniture causing her to fall over.  The patient had also been having some nausea and vomiting that started the day prior to admission which has resulted in just nausea now without any further vomiting.  The patient has a history of having abdominal bloating and gas for which she was seeing her primary gastroenterologist for and was found to have a hernia that needed repair.  Patient was scheduled for a left heart catheterization for tomorrow and is now being followed by cardiology inpatient.  The patient has had a history of bowel issues with soft stools and was having looser bowel movements recently but has not had any bowel movement since being in the hospital. The patient's daughter attributed the vomiting to the pain since it started after the patient had fallen and hurt her ribs.  She was treated with antiemetics and reported that the medication was making her worse.  Last month the patient had a CTA that showed no PE but now has a positive scan for PE. The patient CT scan of the abdomen pelvis with contrast did not show any acute intra-abdominal acute pelvic abnormalities. The patient is  reported to have a left ventricular ejection fraction back in April of this year of 60 to 65% the patient also had cardiac stents placed in September 2024.  Past Medical History:  Diagnosis Date   Allergic rhinitis    Anxiety    Arthritis    Asthma    Atrophic vaginitis    B12 deficiency    Bronchitis, chronic (HCC)    CAD (coronary artery disease)    a. 03/2003 Cath: Nl cors; b. 09/2022 Staged PCI: LM nl, LAD 85p/45m (3.5x26 Onyx Frontier DES), RI small, mild dzs, LCX nl, OM2 mild dzs, RCA 30p.   Cervical disc disease    CKD (chronic kidney disease), stage III (HCC)    Collagen vascular disease    RA   COPD (chronic obstructive pulmonary disease) (HCC)    Cough - post-COVID    Depression    Diastolic dysfunction    a. 11/2020 Echo: EF 45-50%, GrI DD; b. 10/2021 Echo: EF 60-65%, GrII DD; c. 09/2022 Echo: EF 55-60%, no rwma, GrI DD, nl RV fxn, mildly dil LA. No significant valvular dzs; d. 04/2023 Echo: EF 60-65% no rwma, mild LVH, nl RV fxn, RVSP 23.61mmHg. AoV sclerosis.   Fibrocystic breast disease    Foot ulcer (HCC)    GERD (gastroesophageal reflux disease)    Hammer toe    Hyperlipidemia    IBS (irritable bowel syndrome)  Macrocytic anemia    Macular degeneration 2023   Nausea and vomiting 12/23/2023   Obesity    OSA (obstructive sleep apnea)    Osteoarthrosis    Multi site   Osteopenia    PSVT (paroxysmal supraventricular tachycardia)    a. 04/2022 Zio: Sinus rhythm @ 57 (42-101). Rare PACs/PVCs. 74290 SVT runs lasting up to 70m 12sec - max rate 222. 5 beats NSVT. Triggered events = sinus, SVT, PACs.   Rheumatoid arthritis (HCC)    Shingles    Thyroid  nodule    MULTIPLE   Tonsillitis    recurrent   Umbilical hernia     Past Surgical History:  Procedure Laterality Date   BACK SURGERY  2011   LAMINECTOMY   BREAST BIOPSY Right    neg   CERVICAL FUSION  2011, 2012   X 2   COLONOSCOPY  2005, 2015   COLONOSCOPY WITH PROPOFOL  N/A 08/04/2020   Procedure: COLONOSCOPY  WITH PROPOFOL ;  Surgeon: Dessa Reyes ORN, MD;  Location: [facility] ENDOSCOPY;  Service: Endoscopy;  Laterality: N/A;   CORONARY STENT INTERVENTION N/A 10/02/2022   Procedure: CORONARY STENT INTERVENTION;  Surgeon: Darron Deatrice LABOR, MD;  Location: [facility] INVASIVE CV LAB;  Service: Cardiovascular;  Laterality: N/A;   ESOPHAGOGASTRODUODENOSCOPY (EGD) WITH PROPOFOL  N/A 09/26/2021   Procedure: ESOPHAGOGASTRODUODENOSCOPY (EGD) WITH PROPOFOL ;  Surgeon: Onita Elspeth Sharper, DO;  Location: [facility] ENDOSCOPY;  Service: Gastroenterology;  Laterality: N/A;   ESOPHAGOGASTRODUODENOSCOPY ENDOSCOPY  2005, 2013, 2015   EYE SURGERY Bilateral    Cataract Extraction with IOL    HERNIA REPAIR     JOINT REPLACEMENT     KNEE ARTHROPLASTY Left 07/12/2016   Procedure: COMPUTER ASSISTED TOTAL KNEE ARTHROPLASTY;  Surgeon: Mardee Lynwood SQUIBB, MD;  Location: [facility] ORS;  Service: Orthopedics;  Laterality: Left;   Laryngeal tear after intubation w/repair     LUMBAR DISC SURGERY  12/2009   REPLACEMENT TOTAL KNEE Right 06/2007   DR. HOOTEN, [facility]   REPLACEMENT UNICONDYLAR JOINT KNEE     DR. HOOTEN, [facility]   RIGHT/LEFT HEART CATH AND CORONARY ANGIOGRAPHY N/A 09/29/2022   Procedure: RIGHT/LEFT HEART CATH AND CORONARY ANGIOGRAPHY;  Surgeon: Darron Deatrice LABOR, MD;  Location: [facility] INVASIVE CV LAB;  Service: Cardiovascular;  Laterality: N/A;   SHOULDER ARTHROSCOPY WITH ROTATOR CUFF REPAIR Right    TONSILLECTOMY     TOTAL HIP ARTHROPLASTY Right 01/31/2017   Procedure: TOTAL HIP ARTHROPLASTY;  Surgeon: Mardee Lynwood SQUIBB, MD;  Location: [facility] ORS;  Service: Orthopedics;  Laterality: Right;   TUBAL LIGATION     UPPER GI ENDOSCOPY     VENTRAL HERNIA REPAIR N/A 09/04/2023   Procedure: REPAIR, HERNIA, VENTRAL;  Surgeon: Marinda Jayson KIDD, MD;  Location: [facility] ORS;  Service: General;  Laterality: N/A;    Prior to Admission medications   Medication Sig Start Date End Date Taking? Authorizing Provider  acetaminophen  (TYLENOL ) 500 MG tablet Take 1,000  mg by mouth 3 (three) times daily as needed for mild pain.    Yes [provider]  albuterol  (VENTOLIN  HFA) 108 (90 Base) MCG/ACT inhaler Inhale 1-2 puffs into the lungs every 6 (six) hours as needed for wheezing or shortness of breath.   Yes [provider]  aspirin  EC 81 MG tablet Take 81 mg by mouth daily. Swallow whole.   Yes [provider]  budesonide -formoterol  (SYMBICORT ) 160-4.5 MCG/ACT inhaler Inhale 2 puffs into the lungs daily. 07/09/23 07/08/24 Yes [provider]  clopidogrel  (PLAVIX ) 75 MG tablet TAKE 1 TABLET BY MOUTH  DAILY WITH BREAKFAST. 06/08/23  Yes Vivienne Lonni Ingle, NP  Cyanocobalamin 1000 MCG/ML KIT Inject as directed every 30 (thirty) days.   Yes [provider]  diltiazem  (CARDIZEM  CD) 180 MG 24 hr capsule Take 1 capsule (180 mg total) by mouth in the morning and at bedtime. 06/27/23  Yes Cindie Ole DASEN, MD  diphenhydrAMINE  (BENADRYL ) 25 mg capsule Take 25 mg by mouth every 6 (six) hours as needed for itching (Take with Hydrocoone/APAP for itching.).   Yes [provider]  Dupilumab 300 MG/2ML SOPN Inject 4 mLs into the skin every 14 (fourteen) days. 12/15/21  Yes [provider]  EPINEPHrine  0.3 mg/0.3 mL IJ SOAJ injection Inject 0.3 mg into the muscle as needed. 11/29/21  Yes [provider]  famotidine  (PEPCID ) 20 MG tablet Take 20 mg by mouth daily as needed.   Yes [provider]  gabapentin  (NEURONTIN ) 100 MG capsule Take 100 mg by mouth at bedtime as needed. 12/17/23  Yes [provider]  gabapentin  (NEURONTIN ) 300 MG capsule Take 300 mg by mouth at bedtime as needed.   Yes [provider]  HYDROcodone -acetaminophen  (NORCO/VICODIN) 5-325 MG tablet Take 1 tablet by mouth every 4 (four) hours as needed. 12/22/23  Yes Dorothyann Drivers, MD  ipratropium (ATROVENT) 0.03 % nasal spray Place 1-2 sprays into both nostrils daily. 11/20/23 11/19/24 Yes [provider]   levalbuterol  (XOPENEX ) 0.63 MG/3ML nebulizer solution Take 0.63 mg by nebulization every 6 (six) hours as needed for wheezing. 04/25/21  Yes [provider]  meclizine (ANTIVERT) 12.5 MG tablet Take 12.5 mg by mouth daily as needed for dizziness or nausea. 10/05/18  Yes [provider]  montelukast  (SINGULAIR ) 10 MG tablet Take 10 mg by mouth daily.   Yes [provider]  Multiple Vitamins-Minerals (PRESERVISION AREDS 2 PO) Take 2 tablets by mouth daily. Sometimes takes 2 at the same time   Yes [provider]  ondansetron  (ZOFRAN -ODT) 4 MG disintegrating tablet Take 1 tablet (4 mg total) by mouth every 8 (eight) hours as needed for nausea or vomiting. 12/22/23  Yes Dorothyann Drivers, MD  pantoprazole  (PROTONIX ) 40 MG tablet Take 40 mg by mouth daily.   Yes [provider]  sertraline  (ZOLOFT ) 50 MG tablet Take 50 mg by mouth daily.   Yes [provider]  SIMETHICONE  PO Take 250 mg by mouth as needed.   Yes [provider]  triamcinolone  (NASACORT ) 55 MCG/ACT AERO nasal inhaler Place 1 spray into the nose daily.   Yes [provider]  doxycycline  (VIBRA -TABS) 100 MG tablet Take 100 mg by mouth 2 (two) times daily. 12/20/23 12/27/23  [provider]    Family History  Problem Relation Age of Onset   Asthma Mother    Breast cancer Mother 31   Emphysema Father        smoked   Lung cancer Father        smoked   Coronary artery disease Father 50     Social History   Tobacco Use   Smoking status: Never   Smokeless tobacco: Never  Vaping Use   Vaping status: Never Used  Substance Use Topics   Alcohol use: No   Drug use: No    Allergies as of 12/23/2023 - Review Complete 12/23/2023  Allergen Reaction Noted   Amiodarone  Other (See Comments) 11/09/2022   Lactose Other (See Comments) and Diarrhea    Hydrocodone -acetaminophen  Itching 06/05/2013   Other Other (See Comments) 01/19/2017   Biaxin [clarithromycin]  Other (See Comments) 03/27/2012   Hydrocodone -acetaminophen  Itching 06/05/2013   Oxycodone  Itching 12/20/2013   Sulfa antibiotics Diarrhea and Other (See Comments) 03/27/2012   Sulfasalazine Diarrhea     Review of Systems:    All systems reviewed and negative except where noted in HPI.   Physical Exam:  Vital signs in last 24 hours: Temp:  [97.5 F (36.4 C)-99 F (37.2 C)] 98.7 F (37.1 C) (12/08 1100) Pulse Rate:  [61-129] 61 (12/08 1312) Resp:  [20-28] 20 (12/08 1100) BP: (117-176)/(87-118) 117/101 (12/08 1312) SpO2:  [95 %-100 %] 100 % (12/08 1312) Weight:  [90.7 kg] 90.7 kg (12/07 1716) Last BM Date : 12/23/23 General:   Pleasant, lethargic after given sedation medication in NAD Head:  Normocephalic and atraumatic. Eyes:   No icterus.   Conjunctiva pink. PERRLA. Ears:  Normal auditory acuity. Neck:  Supple; no masses or thyroidomegaly Lungs: Respirations even and unlabored. Lungs clear to auscultation bilaterally.   No wheezes, crackles, or rhonchi.  Heart:  Regular rate and rhythm;  Without murmur, clicks, rubs or gallops Abdomen:  Soft, nondistended, nontender. Normal bowel sounds. No appreciable masses or hepatomegaly.  No rebound or guarding.  Rectal:  Not performed. Msk:  Symmetrical without gross deformities.    Extremities:  Without edema, cyanosis or clubbing. Skin: Abrasion on her right knee  LAB RESULTS: Recent Labs    12/22/23 1606 12/23/23 1719 12/24/23 0520  WBC 8.3 7.9 6.7  HGB 13.0 12.9 11.1*  HCT 39.0 37.8 33.3*  PLT 142* 143* 147*   BMET Recent Labs    12/22/23 1606 12/23/23 1719  NA 132* 131*  K 4.2 4.5  CL 95* 93*  CO2 26 27  GLUCOSE 117* 93  BUN 17 14  CREATININE 1.41* 1.20*  CALCIUM  8.7* 9.0   LFT Recent Labs    12/23/23 1719  PROT 6.1*  ALBUMIN 3.8  AST 28  ALT 25  ALKPHOS 91  BILITOT 0.7   PT/INR Recent Labs    12/23/23 1939  LABPROT 13.3  INR 1.0    STUDIES: CT Angio Chest PE W and/or Wo Contrast Result Date:  12/23/2023 CLINICAL DATA:  Clemens yesterday, nausea and vomiting, weakness, increasing shortness of breath EXAM: CT ANGIOGRAPHY CHEST CT ABDOMEN AND PELVIS WITH CONTRAST TECHNIQUE: Multidetector CT imaging of the chest was performed using the standard protocol during bolus administration of intravenous contrast. Multiplanar CT image reconstructions and MIPs were obtained to evaluate the vascular anatomy. Multidetector CT imaging of the abdomen and pelvis was performed using the standard protocol during bolus administration of intravenous contrast. RADIATION DOSE REDUCTION: This exam was performed according to the departmental dose-optimization program which includes automated exposure control, adjustment of the mA and/or kV according to patient size and/or use of iterative reconstruction technique. CONTRAST:  OMNIPAQUE  IOHEXOL  350 MG/ML SOLN COMPARISON:  12/23/2023, 11/21/2023, 09/04/2023 FINDINGS: CTA CHEST FINDINGS Cardiovascular: This is a technically adequate evaluation of the pulmonary vasculature. Right-sided central and lobar pulmonary emboli are identified. No evidence of right heart strain. Lipomatous hypertrophy of the interatrial septum is again noted. No pericardial effusion. Normal caliber of the thoracic aorta. Stable atherosclerosis of the aorta and coronary vasculature. Mediastinum/Nodes: Stable appearance of the thyroid . Trachea and esophagus are unremarkable. No pathologic adenopathy. Lungs/Pleura: Trace left pleural effusion. No acute airspace disease or pneumothorax. Dependent hypoventilatory changes are seen within the lower lobes. Musculoskeletal: No acute or destructive bony abnormalities. Reconstructed images demonstrate no additional findings. Review of the MIP images confirms the above findings. CT  ABDOMEN and PELVIS FINDINGS Hepatobiliary: No focal liver abnormality is seen. No gallstones, gallbladder wall thickening, or biliary dilatation. Pancreas: Unremarkable. No pancreatic ductal  dilatation or surrounding inflammatory changes. Spleen: Normal in size without focal abnormality. Adrenals/Urinary Tract: Bilateral renal cortical atrophy again noted. No abnormal renal enhancement. No urinary tract calculi or obstructive uropathy. The adrenals are stable. Bladder is decompressed, limiting its evaluation. Stomach/Bowel: No bowel obstruction or ileus. Normal appendix right lower quadrant. Sigmoid diverticulosis without evidence of acute diverticulitis. No bowel wall thickening or inflammatory change. Vascular/Lymphatic: No significant vascular findings are present. No enlarged abdominal or pelvic lymph nodes. Reproductive: Uterus and bilateral adnexa are unremarkable. Other: No free fluid or free intraperitoneal gas. No abdominal wall hernia. Musculoskeletal: Unremarkable right hip arthroplasty. There are no acute or destructive bony abnormalities. Reconstructed images demonstrate no additional findings. Review of the MIP images confirms the above findings. IMPRESSION: Chest: 1. Right-sided central and segmental pulmonary emboli. No right heart strain. 2. Trace left pleural effusion. 3. Aortic Atherosclerosis (ICD10-I70.0). Coronary artery atherosclerosis. Abdomen/pelvis: 1. No acute intra-abdominal or intrapelvic process. 2. Sigmoid diverticulosis without diverticulitis. Critical Value/emergent results were called by telephone at the time of interpretation on 12/23/2023 at 7:09 pm to provider PHILLIP STAFFORD , who verbally acknowledged these results. Electronically Signed   By: Ozell Daring M.D.   On: 12/23/2023 19:12   CT ABDOMEN PELVIS W CONTRAST Result Date: 12/23/2023 CLINICAL DATA:  Clemens yesterday, nausea and vomiting, weakness, increasing shortness of breath EXAM: CT ANGIOGRAPHY CHEST CT ABDOMEN AND PELVIS WITH CONTRAST TECHNIQUE: Multidetector CT imaging of the chest was performed using the standard protocol during bolus administration of intravenous contrast. Multiplanar CT image  reconstructions and MIPs were obtained to evaluate the vascular anatomy. Multidetector CT imaging of the abdomen and pelvis was performed using the standard protocol during bolus administration of intravenous contrast. RADIATION DOSE REDUCTION: This exam was performed according to the departmental dose-optimization program which includes automated exposure control, adjustment of the mA and/or kV according to patient size and/or use of iterative reconstruction technique. CONTRAST:  OMNIPAQUE  IOHEXOL  350 MG/ML SOLN COMPARISON:  12/23/2023, 11/21/2023, 09/04/2023 FINDINGS: CTA CHEST FINDINGS Cardiovascular: This is a technically adequate evaluation of the pulmonary vasculature. Right-sided central and lobar pulmonary emboli are identified. No evidence of right heart strain. Lipomatous hypertrophy of the interatrial septum is again noted. No pericardial effusion. Normal caliber of the thoracic aorta. Stable atherosclerosis of the aorta and coronary vasculature. Mediastinum/Nodes: Stable appearance of the thyroid . Trachea and esophagus are unremarkable. No pathologic adenopathy. Lungs/Pleura: Trace left pleural effusion. No acute airspace disease or pneumothorax. Dependent hypoventilatory changes are seen within the lower lobes. Musculoskeletal: No acute or destructive bony abnormalities. Reconstructed images demonstrate no additional findings. Review of the MIP images confirms the above findings. CT ABDOMEN and PELVIS FINDINGS Hepatobiliary: No focal liver abnormality is seen. No gallstones, gallbladder wall thickening, or biliary dilatation. Pancreas: Unremarkable. No pancreatic ductal dilatation or surrounding inflammatory changes. Spleen: Normal in size without focal abnormality. Adrenals/Urinary Tract: Bilateral renal cortical atrophy again noted. No abnormal renal enhancement. No urinary tract calculi or obstructive uropathy. The adrenals are stable. Bladder is decompressed, limiting its evaluation.  Stomach/Bowel: No bowel obstruction or ileus. Normal appendix right lower quadrant. Sigmoid diverticulosis without evidence of acute diverticulitis. No bowel wall thickening or inflammatory change. Vascular/Lymphatic: No significant vascular findings are present. No enlarged abdominal or pelvic lymph nodes. Reproductive: Uterus and bilateral adnexa are unremarkable. Other: No free fluid or free intraperitoneal gas. No abdominal wall hernia.  Musculoskeletal: Unremarkable right hip arthroplasty. There are no acute or destructive bony abnormalities. Reconstructed images demonstrate no additional findings. Review of the MIP images confirms the above findings. IMPRESSION: Chest: 1. Right-sided central and segmental pulmonary emboli. No right heart strain. 2. Trace left pleural effusion. 3. Aortic Atherosclerosis (ICD10-I70.0). Coronary artery atherosclerosis. Abdomen/pelvis: 1. No acute intra-abdominal or intrapelvic process. 2. Sigmoid diverticulosis without diverticulitis. Critical Value/emergent results were called by telephone at the time of interpretation on 12/23/2023 at 7:09 pm to provider PHILLIP STAFFORD , who verbally acknowledged these results. Electronically Signed   By: Ozell Daring M.D.   On: 12/23/2023 19:12   CT Head Wo Contrast Result Date: 12/23/2023 EXAM: CT HEAD WITHOUT CONTRAST 12/23/2023 06:48:03 PM TECHNIQUE: CT of the head was performed without the administration of intravenous contrast. Automated exposure control, iterative reconstruction, and/or weight based adjustment of the mA/kV was utilized to reduce the radiation dose to as low as reasonably achievable. COMPARISON: None available. CLINICAL HISTORY: Head trauma, minor (Age >= 65y) FINDINGS: BRAIN AND VENTRICLES: No acute hemorrhage. No evidence of acute infarct. No hydrocephalus. No extra-axial collection. No mass effect or midline shift. 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 Patchy white matter hypodensities are compatible with chronic microvascular ischemic change. Remote right thalamic lacunar infarct. ORBITS: No acute abnormality. SINUSES: No acute abnormality. SOFT TISSUES AND SKULL: No acute soft tissue abnormality. No skull fracture. IMPRESSION: 1. No acute intracranial abnormality. 2. 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 Electronically signed by: Gilmore Molt MD 12/23/2023 06:58 PM EST RP Workstation: [id number]   DG Chest 2 View Result Date: 12/23/2023 EXAM: 2 VIEW(S) XRAY OF THE CHEST 12/23/2023 05:52:00 PM COMPARISON: Yesterday. CLINICAL HISTORY: SOB. FINDINGS: LUNGS AND PLEURA: No focal pulmonary opacity. No pleural effusion. No pneumothorax. HEART AND MEDIASTINUM: Stable cardiomediastinal silhouette. BONES AND SOFT TISSUES: No acute osseous abnormality. IMPRESSION: 1. No acute cardiopulmonary process. Electronically signed by: Lynwood Seip MD 12/23/2023 06:07 PM EST RP Workstation: [id number]   DG Ribs Unilateral W/Chest Left Result Date: 12/22/2023 CLINICAL DATA:  Clemens, rib pain EXAM: LEFT RIBS AND CHEST - 3+ VIEW COMPARISON:  06/14/2023 FINDINGS: Frontal view of the chest as well as frontal and oblique views of the left thoracic cage are obtained. Stable enlargement of the cardiac silhouette. Mild vascular congestion without airspace disease, effusion, or pneumothorax. No acute displaced fractures. IMPRESSION: 1. Enlarged cardiac silhouette, with mild chronic pulmonary vascular congestion. 2. No acute intrathoracic process. 3. No displaced fracture. Electronically Signed   By: Ozell Daring M.D.   On: 12/22/2023 16:49      Impression / Plan:   Assessment: Principal Problem:   Acute pulmonary embolism, recurrent (HCC) Active Problems:   Rheumatoid arthritis (HCC)   Chronic venous insufficiency   PSVT (paroxysmal supraventricular tachycardia)   History of pulmonary embolism    CAD S/P percutaneous coronary angioplasty   COPD (chronic obstructive pulmonary disease) (HCC)   HTN (hypertension)   Chronic kidney disease, stage 3a (HCC)   Nausea and vomiting   Contusion of chest wall, left, subsequent encounter   BERDIA LACHMAN is a 78 y.o. y/o female with who follows up with Dr. Onita wh came with nausea and vomiting that has progressed to only nausea.  The patient has the feeling of having GERD. The patient had an EGD by Dr. Onita back in September 2023 that showed:  Impression:  - Normal duodenal bulb, first portion of the duodenum and second  portion of the duodenum.  - Gastritis. Biopsied.  - A single gastric polyp. Resected and retrieved.  - Small hiatal hernia.  - Z- line regular.  - Esophagogastric landmarks identified.  - Normal mucosa was found in the entire esophagus. Biopsied.  - Tortuous esophagus.  - Abnormal esophageal motility, suspicious for presbyesophagus. Dilated.  The patient has already been started on Pepcid .  Plan:  I would recommend continuing acid suppression and antiemetics.  The patient's nausea may be related to the patient's acute illness versus possible peptic ulcer disease versus reflux.  The patient is on a H2 blocker at the present time.  I have spoken to the patient's daughter who would like to proceed with an upper endoscopy prior to the patient's discharge if possible.  I have told them that I would be amenable to this after surgery has cleared her from a cardiology point of view prior to giving her anesthesia.  The patient and family have been explained the plan and agree with it.   Thank you for involving me in the care of this patient.      LOS: 0 days   Courtney Copping, MD, MD. [place] 12/24/2023, 2:21 PM,  Pager [phone] 7am-5pm  Check AMION for 5pm -7am coverage and on weekends   Note: This dictation was prepared with Dragon dictation along with smaller phrase technology. Any transcriptional errors that result from  this process are unintentional.

## 2023-12-24 NOTE — Consult Note (Signed)
 PHARMACY - ANTICOAGULATION CONSULT NOTE  Pharmacy Consult for Heparin   Indication: pulmonary embolus  Patient Measurements: Height: 5' 4 (162.6 cm) Weight: 90.7 kg (199 lb 15.3 oz) IBW/kg (Calculated) : 54.7 HEPARIN  DW (KG): 75.1  Labs: Recent Labs    12/22/23 1606 12/23/23 1719 12/23/23 1939 12/24/23 0520 12/24/23 1416  HGB 13.0 12.9  --  11.1*  --   HCT 39.0 37.8  --  33.3*  --   PLT 142* 143*  --  147*  --   APTT  --   --  27 65*  --   LABPROT  --   --  13.3  --   --   INR  --   --  1.0  --   --   HEPARINUNFRC  --   --   --  0.46 0.39  CREATININE 1.41* 1.20*  --   --   --     Estimated Creatinine Clearance: 43.5 mL/min (A) (by C-G formula based on SCr of 1.2 mg/dL (H)).   Medical History: Past Medical History:  Diagnosis Date   Allergic rhinitis    Anxiety    Arthritis    Asthma    Atrophic vaginitis    B12 deficiency    Bronchitis, chronic (HCC)    CAD (coronary artery disease)    a. 03/2003 Cath: Nl cors; b. 09/2022 Staged PCI: LM nl, LAD 85p/42m (3.5x26 [employer] DES), RI small, mild dzs, LCX nl, OM2 mild dzs, RCA 30p.   Cervical disc disease    CKD (chronic kidney disease), stage III (HCC)    Collagen vascular disease    RA   COPD (chronic obstructive pulmonary disease) (HCC)    Cough - post-COVID    Depression    Diastolic dysfunction    a. 11/2020 Echo: EF 45-50%, GrI DD; b. 10/2021 Echo: EF 60-65%, GrII DD; c. 09/2022 Echo: EF 55-60%, no rwma, GrI DD, nl RV fxn, mildly dil LA. No significant valvular dzs; d. 04/2023 Echo: EF 60-65% no rwma, mild LVH, nl RV fxn, RVSP 23.50mmHg. AoV sclerosis.   Fibrocystic breast disease    Foot ulcer (HCC)    GERD (gastroesophageal reflux disease)    Hammer toe    Hyperlipidemia    IBS (irritable bowel syndrome)    Macrocytic anemia    Macular degeneration 2023   Nausea and vomiting 12/23/2023   Obesity    OSA (obstructive sleep apnea)    Osteoarthrosis    Multi site   Osteopenia    PSVT (paroxysmal  supraventricular tachycardia)    a. 04/2022 [facility]: Sinus rhythm @ 57 (42-101). Rare PACs/PVCs. 74290 SVT runs lasting up to 46m 12sec - max rate 222. 5 beats NSVT. Triggered events = sinus, SVT, PACs.   Rheumatoid arthritis (HCC)    Shingles    Thyroid  nodule    MULTIPLE   Tonsillitis    recurrent   Umbilical hernia    Assessment: Pt presents to the ED after a fall and increasing SOB last few weeks. Hx of aflutter, SVT, total replacement of hip, CAD, and CKD. Head CT, no note of bleed. Chest CT: Right-sided central and segmental pulmonary emboli. No right heart strain. CBC stable. No note of DOAC PTA. Pharmacy consulted to start heparin  infusion for PE.   Goal of Therapy:  Heparin  level 0.3-0.7 units/ml Monitor platelets by anticoagulation protocol: Yes   Plan:  --Heparin  level is therapeutic x 2 --Continue heparin  infusion at 1200 units/hr --HL & CBC tomorrow AM  Courtney  [name] 12/24/2023,2:50 PM

## 2023-12-24 NOTE — Plan of Care (Signed)

## 2023-12-24 NOTE — Consult Note (Signed)
 Cardiology Consultation   Patient ID: Courtney Wilcox MRN: [id number]; DOB: [birth year]  Admit date: 12/23/2023 Date of Consult: 12/24/2023  PCP:  Courtney Eva POUR, PA   [facility] Providers Cardiologist:  Lonni Hanson, MD  Electrophysiologist:  OLE ONEIDA HOLTS, MD       Patient Profile: Courtney Wilcox is a 78 y.o. 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 Awanda.  History of Present Illness:   Courtney Wilcox previously underwent diagnostic catheterization in 2005 which showed normal coronary arteries.  She was evaluated by EP in June 2023 in the setting of PSVT which was felt to be atrial tachycardia.  She was switched from short acting to long-acting metoprolol  with subsequent plan to place a loop monitor to assess overall SVT burden.  October 2023 echo showed EF 60 to 65%, G2 DD, mild to moderate mitral regurg.  She had been followed by [facility] pulmonary in the setting of persistent dyspnea, asthma, post-COVID cough and history of PE on oral anticoagulation.  She originally established with [facility] in January 2024 with Dr. Hanson.  At that time she was having ongoing dyspnea and intermittent chest discomfort.  It was felt that her symptoms did not improve following pulmonary medical interventions or infusions instead of ongoing anemia, diagnostic catheterization may become appropriate.  In April 2024 she was noted to have recurrent SVT.  14-day ZIO monitor was placed and subsequently showed 25,700 SVT runs lasting up to 40 minutes at a maximum rate of 222 bpm and the average resting heart rate of 57 bpm.  She was evaluated by EP and  switch from Toprol -XL to diltiazem  which was subsequently titrated to 180 mg daily with ongoing tachycardia without significant improvement in overall burden when compared to Toprol -XL therapy.  In September 2024 she continued to complain of dyspnea and substernal chest tightness.  She was referred to the emergency department today active chest pain and ruled out for MI.  She underwent diagnostic catheterization on September 13 which showed severe proximal LAD disease.  She had recurrence of SVT during the procedure which made angiogram difficult and she was placed on amiodarone  and subsequently underwent staged PCI/DES to the proximal mid LAD on September 16.  Following initiation of amiodarone  she had significant improvement in SVT burden and was discharged home on oral Amio as well as dual antiplatelet therapy.  Eliquis  was not resumed intermittent greater than 12 months since PE.  Unfortunately she had significant nausea, anorexia, and lethargy on Amio and this was DC'd in October 2024.  She was having more frequent palpitations and elevated heart rates at home in the absence of sustained arrhythmias.  She was seen by EP and was felt not to be a candidate for 1C agent or Tikosyn due to CAD and CKD respectively.  She was admitted to the hospital in April 2025 with an asthma exacerbation and demand ischemia with elevated troponin.  Echocardiogram revealed an LVEF of 60 to 65% with mild LVH, normal LV function, RVSP 23.6 mmHg and aortic sclerosis.  Troponin elevation was felt to be secondary to demand ischemia in the setting of tachycardia and asthma exacerbation.  In August 2025 she underwent hernia repair with a relatively uneventful postoperative course.  In September she started having worsening dyspnea the pulmonary initially worked up for exacerbation of asthma or allergic reaction.  She was given Solu-Medrol  IM and prednisone  taper.  She was seen in October with continued dyspnea and wheezing as well  as upper airway tightness and choking sensation.  She was evaluated by pulmonary 11/20/2023 with continued worsening dyspnea.  D-dimer was 2.12 a CTA of the chest on 11/21/2023 showed mild cardiomegaly, no PE, and coronary vascular calcification.  There was mild dilatation of the main pulmonary trunk suggestive of pulmonary hypertension.  She was seen in clinic 11/30/2018 follow-up with ongoing symptoms of dyspnea on exertion and precordial chest pain with a 58-month history of progressive dyspnea on exertion associated with upper airway tightness, choking sensation, stridor, and wheezing.  She was scheduled for right heart catheterization at the request of her pulmonologist.    She presented to the [facility] emergency department 12/23/2023 with her daughter after a fall and recently being diagnosed with left rib fractures.  She was nauseous and vomited she was discharged with Zofran  and Tylenol .  Nausea and increase in the patient been unable to take pain medications due to nausea.  Patient is feeling somewhat out of breath since and bumped into furniture causing her to fall over.  She endorses a nonproductive cough.  Shortness of breath have been worsening and become progressive over the last week.  Daughter was concerned that she may be dehydrated.  Initial vital signs revealed blood pressure 133/102, pulse 110, respirations 28, temperature 98.  Pertinent labs revealed sodium 131, chloride 93, serum creatinine 1.20, GFR 47, high-sensitivity troponin 38 and 35, proBNP 272.  CTA of the chest revealed right-sided central and segmental pulmonary emboli with no right heart strain, trace left pleural effusion, aortic atherosclerosis.  Abdomen and pelvis CT revealed no acute intra-abdominal or intrapelvic process, sigmoid diverticulosis without diverticulitis.  She has been treated with heparin  bolus and heparin  infusion as well as Zofran  and nebulizers.  Cardiology was consulted to answer questions related to right and  left heart catheterization scheduled on 12/25/2023 that will now need to be postponed to allow for treatment of her PE.  Past Medical History:  Diagnosis Date   Allergic rhinitis    Anxiety    Arthritis    Asthma    Atrophic vaginitis    B12 deficiency    Bronchitis, chronic (HCC)    CAD (coronary artery disease)    a. 03/2003 Cath: Nl cors; b. 09/2022 Staged PCI: LM nl, LAD 85p/78m (3.5x26 Onyx Frontier DES), RI small, mild dzs, LCX nl, OM2 mild dzs, RCA 30p.   Cervical disc disease    CKD (chronic kidney disease), stage III (HCC)    Collagen vascular disease    RA   COPD (chronic obstructive pulmonary disease) (HCC)    Cough - post-COVID    Depression    Diastolic dysfunction    a. 11/2020 Echo: EF 45-50%, GrI DD; b. 10/2021 Echo: EF 60-65%, GrII DD; c. 09/2022 Echo: EF 55-60%, no rwma, GrI DD, nl RV fxn, mildly dil LA. No significant valvular dzs; d. 04/2023 Echo: EF 60-65% no rwma, mild LVH, nl RV fxn, RVSP 23.51mmHg. AoV sclerosis.   Fibrocystic breast disease    Foot ulcer (HCC)    GERD (gastroesophageal reflux disease)    Hammer toe    Hyperlipidemia    IBS (irritable bowel syndrome)    Macrocytic anemia    Macular degeneration 2023   Nausea and vomiting 12/23/2023   Obesity  OSA (obstructive sleep apnea)    Osteoarthrosis    Multi site   Osteopenia    PSVT (paroxysmal supraventricular tachycardia)    a. 04/2022 Zio: Sinus rhythm @ 57 (42-101). Rare PACs/PVCs. 74290 SVT runs lasting up to 24m 12sec - max rate 222. 5 beats NSVT. Triggered events = sinus, SVT, PACs.   Rheumatoid arthritis (HCC)    Shingles    Thyroid  nodule    MULTIPLE   Tonsillitis    recurrent   Umbilical hernia     Past Surgical History:  Procedure Laterality Date   BACK SURGERY  2011   LAMINECTOMY   BREAST BIOPSY Right    neg   CERVICAL FUSION  2011, 2012   X 2   COLONOSCOPY  2005, 2015   COLONOSCOPY WITH PROPOFOL  N/A 08/04/2020   Procedure: COLONOSCOPY WITH PROPOFOL ;  Surgeon: Dessa Reyes ORN, MD;  Location: [facility] ENDOSCOPY;  Service: Endoscopy;  Laterality: N/A;   CORONARY STENT INTERVENTION N/A 10/02/2022   Procedure: CORONARY STENT INTERVENTION;  Surgeon: Darron Deatrice LABOR, MD;  Location: [facility] INVASIVE CV LAB;  Service: Cardiovascular;  Laterality: N/A;   ESOPHAGOGASTRODUODENOSCOPY (EGD) WITH PROPOFOL  N/A 09/26/2021   Procedure: ESOPHAGOGASTRODUODENOSCOPY (EGD) WITH PROPOFOL ;  Surgeon: Onita Elspeth Sharper, DO;  Location: [facility] ENDOSCOPY;  Service: Gastroenterology;  Laterality: N/A;   ESOPHAGOGASTRODUODENOSCOPY ENDOSCOPY  2005, 2013, 2015   EYE SURGERY Bilateral    Cataract Extraction with IOL    HERNIA REPAIR     JOINT REPLACEMENT     KNEE ARTHROPLASTY Left 07/12/2016   Procedure: COMPUTER ASSISTED TOTAL KNEE ARTHROPLASTY;  Surgeon: Mardee Lynwood SQUIBB, MD;  Location: [facility] ORS;  Service: Orthopedics;  Laterality: Left;   Laryngeal tear after intubation w/repair     LUMBAR DISC SURGERY  12/2009   REPLACEMENT TOTAL KNEE Right 06/2007   DR. HOOTEN, [facility]   REPLACEMENT UNICONDYLAR JOINT KNEE     DR. HOOTEN, [facility]   RIGHT/LEFT HEART CATH AND CORONARY ANGIOGRAPHY N/A 09/29/2022   Procedure: RIGHT/LEFT HEART CATH AND CORONARY ANGIOGRAPHY;  Surgeon: Darron Deatrice LABOR, MD;  Location: [facility] INVASIVE CV LAB;  Service: Cardiovascular;  Laterality: N/A;   SHOULDER ARTHROSCOPY WITH ROTATOR CUFF REPAIR Right    TONSILLECTOMY     TOTAL HIP ARTHROPLASTY Right 01/31/2017   Procedure: TOTAL HIP ARTHROPLASTY;  Surgeon: Mardee Lynwood SQUIBB, MD;  Location: [facility] ORS;  Service: Orthopedics;  Laterality: Right;   TUBAL LIGATION     UPPER GI ENDOSCOPY     VENTRAL HERNIA REPAIR N/A 09/04/2023   Procedure: REPAIR, HERNIA, VENTRAL;  Surgeon: Marinda Jayson KIDD, MD;  Location: [facility] ORS;  Service: General;  Laterality: N/A;     Home Medications:  Prior to Admission medications   Medication Sig Start Date End Date Taking? Authorizing Provider  acetaminophen  (TYLENOL ) 500 MG tablet Take 1,000 mg by mouth 3  (three) times daily as needed for mild pain.    Yes [provider]  albuterol  (VENTOLIN  HFA) 108 (90 Base) MCG/ACT inhaler Inhale 1-2 puffs into the lungs every 6 (six) hours as needed for wheezing or shortness of breath.   Yes [provider]  aspirin  EC 81 MG tablet Take 81 mg by mouth daily. Swallow whole.   Yes [provider]  budesonide -formoterol  (SYMBICORT ) 160-4.5 MCG/ACT inhaler Inhale 2 puffs into the lungs daily. 07/09/23 07/08/24 Yes [provider]  clopidogrel  (PLAVIX ) 75 MG tablet TAKE 1 TABLET BY MOUTH DAILY WITH BREAKFAST. 06/08/23  Yes Vivienne Lonni Ingle, NP  Cyanocobalamin 1000 MCG/ML KIT Inject  as directed every 30 (thirty) days.   Yes [provider]  diltiazem  (CARDIZEM  CD) 180 MG 24 hr capsule Take 1 capsule (180 mg total) by mouth in the morning and at bedtime. 06/27/23  Yes Cindie Ole DASEN, MD  diphenhydrAMINE  (BENADRYL ) 25 mg capsule Take 25 mg by mouth every 6 (six) hours as needed for itching (Take with Hydrocoone/APAP for itching.).   Yes [provider]  Dupilumab 300 MG/2ML SOPN Inject 4 mLs into the skin every 14 (fourteen) days. 12/15/21  Yes [provider]  EPINEPHrine  0.3 mg/0.3 mL IJ SOAJ injection Inject 0.3 mg into the muscle as needed. 11/29/21  Yes [provider]  famotidine  (PEPCID ) 20 MG tablet Take 20 mg by mouth daily as needed.   Yes [provider]  gabapentin  (NEURONTIN ) 100 MG capsule Take 100 mg by mouth at bedtime as needed. 12/17/23  Yes [provider]  gabapentin  (NEURONTIN ) 300 MG capsule Take 300 mg by mouth at bedtime as needed.   Yes [provider]  HYDROcodone -acetaminophen  (NORCO/VICODIN) 5-325 MG tablet Take 1 tablet by mouth every 4 (four) hours as needed. 12/22/23  Yes Dorothyann Drivers, MD  ipratropium (ATROVENT) 0.03 % nasal spray Place 1-2 sprays into both nostrils daily. 11/20/23 11/19/24 Yes [provider]  levalbuterol   (XOPENEX ) 0.63 MG/3ML nebulizer solution Take 0.63 mg by nebulization every 6 (six) hours as needed for wheezing. 04/25/21  Yes [provider]  meclizine (ANTIVERT) 12.5 MG tablet Take 12.5 mg by mouth daily as needed for dizziness or nausea. 10/05/18  Yes [provider]  montelukast  (SINGULAIR ) 10 MG tablet Take 10 mg by mouth daily.   Yes [provider]  Multiple Vitamins-Minerals (PRESERVISION AREDS 2 PO) Take 2 tablets by mouth daily. Sometimes takes 2 at the same time   Yes [provider]  ondansetron  (ZOFRAN -ODT) 4 MG disintegrating tablet Take 1 tablet (4 mg total) by mouth every 8 (eight) hours as needed for nausea or vomiting. 12/22/23  Yes Dorothyann Drivers, MD  pantoprazole  (PROTONIX ) 40 MG tablet Take 40 mg by mouth daily.   Yes [provider]  sertraline  (ZOLOFT ) 50 MG tablet Take 50 mg by mouth daily.   Yes [provider]  SIMETHICONE  PO Take 250 mg by mouth as needed.   Yes [provider]  triamcinolone  (NASACORT ) 55 MCG/ACT AERO nasal inhaler Place 1 spray into the nose daily.   Yes [provider]  doxycycline  (VIBRA -TABS) 100 MG tablet Take 100 mg by mouth 2 (two) times daily. 12/20/23 12/27/23  [provider]    Scheduled Meds:  diltiazem   180 mg Oral Daily   fluticasone  furoate-vilanterol  1 puff Inhalation Daily   pantoprazole   40 mg Oral QHS   sertraline   50 mg Oral Daily   Continuous Infusions:  heparin  1,200 Units/hr (12/23/23 2038)   promethazine  (PHENERGAN ) injection (IM or IVPB) 12.5 mg (12/24/23 0414)   PRN Meds: acetaminophen  **OR** acetaminophen , famotidine , gabapentin , HYDROcodone -acetaminophen , ipratropium-albuterol , metoprolol  tartrate, morphine  injection, ondansetron  **OR** ondansetron  (ZOFRAN ) IV, mouth rinse, promethazine  (PHENERGAN ) injection (IM or IVPB)  Allergies:    Allergies  Allergen Reactions   Amiodarone  Other (See Comments)    Fatigue, Nausea, anorexia,  malaise   Lactose Other (See Comments) and Diarrhea   Other Other (See Comments)    Pt ONLY tolerates SLOW-FE (slow released iron )---upsets IBS   Biaxin [Clarithromycin] Other (See Comments)    GI upset    Hydrocodone -Acetaminophen  Itching    Tolerates acetaminophen   Oxycodone  Itching   Sulfa Antibiotics Diarrhea and Other (See Comments)    GI upset   Sulfasalazine Diarrhea    Social History:   Social History   Socioeconomic History   Marital status: Widowed    Spouse name: Arley   Number of children: 3   Years of education: Not on file   Highest education level: Not on file  Occupational History   Not on file  Tobacco Use   Smoking status: Never   Smokeless tobacco: Never  Vaping Use   Vaping status: Never Used  Substance and Sexual Activity   Alcohol use: No   Drug use: No   Sexual activity: Not on file  Other Topics Concern   Not on file  Social History Narrative   Not on file   Social Drivers of Health   Financial Resource Strain: Low Risk  (12/20/2023)   Received from [facility]   Overall Financial Resource Strain (CARDIA)    Difficulty of Paying Living Expenses: Not hard at all  Food Insecurity: No Food Insecurity (12/24/2023)   Hunger Vital Sign    Worried About Running Out of Food in the Last Year: Never true    Ran Out of Food in the Last Year: Never true  Transportation Needs: No Transportation Needs (12/23/2023)   PRAPARE - Administrator, Civil Service (Medical): No    Lack of Transportation (Non-Medical): No  Physical Activity: Not on file  Stress: Not on file  Social Connections: Moderately Isolated (12/23/2023)   Social Connection and Isolation Panel    Frequency of Communication with Friends and Family: More than three times a week    Frequency of Social Gatherings with Friends and Family: More than three times a week    Attends Religious Services: More than 4 times per year    Active Member of [employer] or  Organizations: No    Attends Banker Meetings: Never    Marital Status: Widowed  Intimate Partner Violence: Not At Risk (12/24/2023)   Humiliation, Afraid, Rape, and Kick questionnaire    Fear of Current or Ex-Partner: No    Emotionally Abused: No    Physically Abused: No    Sexually Abused: No    Family History:    Family History  Problem Relation Age of Onset   Asthma Mother    Breast cancer Mother 74   Emphysema Father        smoked   Lung cancer Father        smoked   Coronary artery disease Father 73     ROS:  Please see the history of present illness.  Review of Systems  Respiratory:  Positive for cough and shortness of breath.   Musculoskeletal:  Positive for falls.  Neurological:  Positive for weakness.    All other ROS reviewed and negative.     Physical Exam/Data: Vitals:   12/24/23 0104 12/24/23 0404 12/24/23 0810 12/24/23 1100  BP: (!) 155/87 (!) 175/100 (!) 176/106   Pulse: (!) 122 (!) 120 (!) 129 92  Resp:  20 (!) 21 20  Temp:  99 F (37.2 C) 98.7 F (37.1 C) 98.7 F (37.1 C)  TempSrc:    Oral  SpO2:  97% 98% 100%  Weight:      Height:        Intake/Output Summary (Last 24 hours) at 12/24/2023 1243 Last data filed at 12/24/2023 1100 Gross per 24 hour  Intake 1347.8 ml  Output  700 ml  Net 647.8 ml      12/23/2023    5:16 PM 12/22/2023    3:52 PM 11/30/2023    3:19 PM  Last 3 Weights  Weight (lbs) 199 lb 15.3 oz 200 lb 198 lb 2 oz  Weight (kg) 90.7 kg 90.719 kg 89.869 kg     Body mass index is 34.32 kg/m.  General:  Well nourished, well developed, in no acute distress HEENT: normal Neck: no JVD Vascular: No carotid bruits; Distal pulses 2+ bilaterally Cardiac:  normal S1, S2; RRR; no murmur  Lungs:  clear with diminished bases faint expiratory wheezes to auscultation bilaterally, previously was on 2 L of O2 via nasal cannula and is now is on room air maintaining oxygen saturations. Abd: soft, nontender, no hepatomegaly   Ext: no edema Musculoskeletal:  No deformities, BUE and BLE strength normal and equal Skin: warm and dry  Neuro:  CNs 2-12 intact, no focal abnormalities noted Psych:  Normal affect   EKG:  The EKG was personally reviewed and demonstrates: Sinus tachycardia with rate of 116 with PACs and left axis deviation Telemetry:  Telemetry was personally reviewed and demonstrates: Sinus rhythm with PACs  Relevant CV Studies:  2d echo 04/26/2023 1. Left ventricular ejection fraction, by estimation, is 60 to 65%. The  left ventricle has normal function. The left ventricle has no regional  wall motion abnormalities. There is mild left ventricular hypertrophy.  Left ventricular diastolic parameters  are indeterminate.   2. Right ventricular systolic function is normal. The right ventricular  size is normal. There is normal pulmonary artery systolic pressure. The  estimated right ventricular systolic pressure is 23.6 mmHg.   3. The mitral valve is normal in structure. No evidence of mitral valve  regurgitation. No evidence of mitral stenosis.   4. The aortic valve is normal in structure. Aortic valve regurgitation is  not visualized. Aortic valve sclerosis is present, with no evidence of  aortic valve stenosis.   5. The inferior vena cava is normal in size with greater than 50%  respiratory variability, suggesting right atrial pressure of 3 mmHg.   Coronary stent intervention 10/02/2022   Prox LAD lesion is 85% stenosed.   Prox LAD to Mid LAD lesion is 50% stenosed.   Prox RCA lesion is 30% stenosed.   A drug-eluting stent was successfully placed using a STENT ONYX FRONTIER 3.5X26.   Post intervention, there is a 10% residual stenosis.   Post intervention, there is a 0% residual stenosis.   Successful drug-eluting stent placement to the ostial/proximal LAD.  The lesion is very eccentric and fibrotic in the proximal segment and thus there was a 10% residual stenosis and spite of high-pressure  noncompliant balloon.   Recommendations: Dual antiplatelet therapy for at least 6 months. Aggressive treatment of risk factors. Hydrate overnight and check renal function in the morning.  68 mL of contrast was used for the procedure.   LHC 09/29/2022   Prox RCA lesion is 30% stenosed.   Prox LAD lesion is 85% stenosed.   Prox LAD to Mid LAD lesion is 50% stenosed.   1.  Severe one-vessel coronary artery disease involving the ostial/proximal LAD 2.  Left ventricular angiography was not performed.  EF was normal by echo.  Normal left ventricular end-diastolic pressure. 3.  Right heart catheterization was attempted but aborted due to severe disease involving the right brachial vein and inability to advance a 5 French Swan-Ganz catheter in spite of being able to  advance an 014 wire. 4.  Frequent supraventricular tachycardia throughout the case which made angiography difficult.   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  In addition, SVT has to be well-controlled before the procedure and thus I elected to start the patient on oral amiodarone  as we are not able to increase diltiazem  due to resting bradycardia. Given that the patient's pulmonary embolism was more than a year ago and she will require dual antiplatelet therapy, I favor not resuming Eliquis  especially that recent CTA showed no evidence of pulmonary embolism.  I started aspirin  and clopidogrel . The echo from today did not show evidence of pulmonary hypertension and thus no need to attempt right heart catheterization at this time.  09/29/2022 1. Left ventricular ejection fraction, by estimation, is 55 to 60%. The  left ventricle has normal function. The left ventricle has no regional  wall motion abnormalities. Left ventricular diastolic parameters are  consistent with Grade I diastolic  dysfunction (impaired relaxation).   2. Right  ventricular systolic function is normal. The right ventricular  size is normal.   3. Left atrial size was mildly dilated.   4. The mitral valve is normal in structure. No evidence of mitral valve  regurgitation.   5. The aortic valve is tricuspid. Aortic valve regurgitation is not  visualized.   6. The inferior vena cava is normal in size with greater than 50%  respiratory variability, suggesting right atrial pressure of 3 mmHg.    Laboratory Data: High Sensitivity Troponin:  No results for input(s): TROPONINIHS in the last 720 hours.   Chemistry Recent Labs  Lab 12/22/23 1606 12/23/23 1719  NA 132* 131*  K 4.2 4.5  CL 95* 93*  CO2 26 27  GLUCOSE 117* 93  BUN 17 14  CREATININE 1.41* 1.20*  CALCIUM  8.7* 9.0  GFRNONAA 38* 47*  ANIONGAP 11 11    Recent Labs  Lab 12/22/23 1606 12/23/23 1719  PROT 5.8* 6.1*  ALBUMIN 3.6 3.8  AST 25 28  ALT 24 25  ALKPHOS 88 91  BILITOT 0.7 0.7   Lipids No results for input(s): CHOL, TRIG, HDL, LABVLDL, LDLCALC, CHOLHDL in the last 168 hours.  Hematology Recent Labs  Lab 12/22/23 1606 12/23/23 1719 12/24/23 0520  WBC 8.3 7.9 6.7  RBC 3.89 3.85* 3.37*  HGB 13.0 12.9 11.1*  HCT 39.0 37.8 33.3*  MCV 100.3* 98.2 98.8  MCH 33.4 33.5 32.9  MCHC 33.3 34.1 33.3  RDW 14.6 14.2 13.9  PLT 142* 143* 147*   Thyroid  No results for input(s): TSH, FREET4 in the last 168 hours.  BNP Recent Labs  Lab 12/23/23 1719  PROBNP 272.0    DDimer No results for input(s): DDIMER in the last 168 hours.  Radiology/Studies:  CT Angio Chest PE W and/or Wo Contrast Result Date: 12/23/2023 CLINICAL DATA:  Clemens yesterday, nausea and vomiting, weakness, increasing shortness of breath EXAM: CT ANGIOGRAPHY CHEST CT ABDOMEN AND PELVIS WITH CONTRAST TECHNIQUE: Multidetector CT imaging of the chest was performed using the standard protocol during bolus administration of intravenous contrast. Multiplanar CT image reconstructions and MIPs were  obtained to evaluate the vascular anatomy. Multidetector CT imaging of the abdomen and pelvis was performed using the standard protocol during bolus administration of intravenous contrast. RADIATION DOSE REDUCTION: This exam was performed according to the departmental dose-optimization program which includes automated exposure control, adjustment of the mA and/or kV according to  patient size and/or use of iterative reconstruction technique. CONTRAST:  OMNIPAQUE  IOHEXOL  350 MG/ML SOLN COMPARISON:  12/23/2023, 11/21/2023, 09/04/2023 FINDINGS: CTA CHEST FINDINGS Cardiovascular: This is a technically adequate evaluation of the pulmonary vasculature. Right-sided central and lobar pulmonary emboli are identified. No evidence of right heart strain. Lipomatous hypertrophy of the interatrial septum is again noted. No pericardial effusion. Normal caliber of the thoracic aorta. Stable atherosclerosis of the aorta and coronary vasculature. Mediastinum/Nodes: Stable appearance of the thyroid . Trachea and esophagus are unremarkable. No pathologic adenopathy. Lungs/Pleura: Trace left pleural effusion. No acute airspace disease or pneumothorax. Dependent hypoventilatory changes are seen within the lower lobes. Musculoskeletal: No acute or destructive bony abnormalities. Reconstructed images demonstrate no additional findings. Review of the MIP images confirms the above findings. CT ABDOMEN and PELVIS FINDINGS Hepatobiliary: No focal liver abnormality is seen. No gallstones, gallbladder wall thickening, or biliary dilatation. Pancreas: Unremarkable. No pancreatic ductal dilatation or surrounding inflammatory changes. Spleen: Normal in size without focal abnormality. Adrenals/Urinary Tract: Bilateral renal cortical atrophy again noted. No abnormal renal enhancement. No urinary tract calculi or obstructive uropathy. The adrenals are stable. Bladder is decompressed, limiting its evaluation. Stomach/Bowel: No bowel obstruction or  ileus. Normal appendix right lower quadrant. Sigmoid diverticulosis without evidence of acute diverticulitis. No bowel wall thickening or inflammatory change. Vascular/Lymphatic: No significant vascular findings are present. No enlarged abdominal or pelvic lymph nodes. Reproductive: Uterus and bilateral adnexa are unremarkable. Other: No free fluid or free intraperitoneal gas. No abdominal wall hernia. Musculoskeletal: Unremarkable right hip arthroplasty. There are no acute or destructive bony abnormalities. Reconstructed images demonstrate no additional findings. Review of the MIP images confirms the above findings. IMPRESSION: Chest: 1. Right-sided central and segmental pulmonary emboli. No right heart strain. 2. Trace left pleural effusion. 3. Aortic Atherosclerosis (ICD10-I70.0). Coronary artery atherosclerosis. Abdomen/pelvis: 1. No acute intra-abdominal or intrapelvic process. 2. Sigmoid diverticulosis without diverticulitis. Critical Value/emergent results were called by telephone at the time of interpretation on 12/23/2023 at 7:09 pm to provider PHILLIP STAFFORD , who verbally acknowledged these results. Electronically Signed   By: Ozell Daring M.D.   On: 12/23/2023 19:12   CT ABDOMEN PELVIS W CONTRAST Result Date: 12/23/2023 CLINICAL DATA:  Clemens yesterday, nausea and vomiting, weakness, increasing shortness of breath EXAM: CT ANGIOGRAPHY CHEST CT ABDOMEN AND PELVIS WITH CONTRAST TECHNIQUE: Multidetector CT imaging of the chest was performed using the standard protocol during bolus administration of intravenous contrast. Multiplanar CT image reconstructions and MIPs were obtained to evaluate the vascular anatomy. Multidetector CT imaging of the abdomen and pelvis was performed using the standard protocol during bolus administration of intravenous contrast. RADIATION DOSE REDUCTION: This exam was performed according to the departmental dose-optimization program which includes automated exposure control,  adjustment of the mA and/or kV according to patient size and/or use of iterative reconstruction technique. CONTRAST:  OMNIPAQUE  IOHEXOL  350 MG/ML SOLN COMPARISON:  12/23/2023, 11/21/2023, 09/04/2023 FINDINGS: CTA CHEST FINDINGS Cardiovascular: This is a technically adequate evaluation of the pulmonary vasculature. Right-sided central and lobar pulmonary emboli are identified. No evidence of right heart strain. Lipomatous hypertrophy of the interatrial septum is again noted. No pericardial effusion. Normal caliber of the thoracic aorta. Stable atherosclerosis of the aorta and coronary vasculature. Mediastinum/Nodes: Stable appearance of the thyroid . Trachea and esophagus are unremarkable. No pathologic adenopathy. Lungs/Pleura: Trace left pleural effusion. No acute airspace disease or pneumothorax. Dependent hypoventilatory changes are seen within the lower lobes. Musculoskeletal: No acute or destructive bony abnormalities. Reconstructed images demonstrate no additional findings. Review  of the MIP images confirms the above findings. CT ABDOMEN and PELVIS FINDINGS Hepatobiliary: No focal liver abnormality is seen. No gallstones, gallbladder wall thickening, or biliary dilatation. Pancreas: Unremarkable. No pancreatic ductal dilatation or surrounding inflammatory changes. Spleen: Normal in size without focal abnormality. Adrenals/Urinary Tract: Bilateral renal cortical atrophy again noted. No abnormal renal enhancement. No urinary tract calculi or obstructive uropathy. The adrenals are stable. Bladder is decompressed, limiting its evaluation. Stomach/Bowel: No bowel obstruction or ileus. Normal appendix right lower quadrant. Sigmoid diverticulosis without evidence of acute diverticulitis. No bowel wall thickening or inflammatory change. Vascular/Lymphatic: No significant vascular findings are present. No enlarged abdominal or pelvic lymph nodes. Reproductive: Uterus and bilateral adnexa are unremarkable. Other:  No free fluid or free intraperitoneal gas. No abdominal wall hernia. Musculoskeletal: Unremarkable right hip arthroplasty. There are no acute or destructive bony abnormalities. Reconstructed images demonstrate no additional findings. Review of the MIP images confirms the above findings. IMPRESSION: Chest: 1. Right-sided central and segmental pulmonary emboli. No right heart strain. 2. Trace left pleural effusion. 3. Aortic Atherosclerosis (ICD10-I70.0). Coronary artery atherosclerosis. Abdomen/pelvis: 1. No acute intra-abdominal or intrapelvic process. 2. Sigmoid diverticulosis without diverticulitis. Critical Value/emergent results were called by telephone at the time of interpretation on 12/23/2023 at 7:09 pm to provider PHILLIP STAFFORD , who verbally acknowledged these results. Electronically Signed   By: Ozell Daring M.D.   On: 12/23/2023 19:12   CT Head Wo Contrast Result Date: 12/23/2023 EXAM: CT HEAD WITHOUT CONTRAST 12/23/2023 06:48:03 PM TECHNIQUE: CT of the head was performed without the administration of intravenous contrast. Automated exposure control, iterative reconstruction, and/or weight based adjustment of the mA/kV was utilized to reduce the radiation dose to as low as reasonably achievable. COMPARISON: None available. CLINICAL HISTORY: Head trauma, minor (Age >= 65y) FINDINGS: BRAIN AND VENTRICLES: No acute hemorrhage. No evidence of acute infarct. No hydrocephalus. No extra-axial collection. No mass effect or midline shift. 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 Patchy white matter hypodensities are compatible with chronic microvascular ischemic change. Remote right thalamic lacunar infarct. ORBITS: No acute abnormality. SINUSES: No acute abnormality. SOFT TISSUES AND SKULL: No acute soft tissue abnormality. No skull fracture. IMPRESSION: 1. No acute intracranial abnormality. 2. 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 Electronically signed by: Gilmore Molt MD 12/23/2023 06:58 PM EST RP Workstation: [id number]   DG Chest 2 View Result Date: 12/23/2023 EXAM: 2 VIEW(S) XRAY OF THE CHEST 12/23/2023 05:52:00 PM COMPARISON: Yesterday. CLINICAL HISTORY: SOB. FINDINGS: LUNGS AND PLEURA: No focal pulmonary opacity. No pleural effusion. No pneumothorax. HEART AND MEDIASTINUM: Stable cardiomediastinal silhouette. BONES AND SOFT TISSUES: No acute osseous abnormality. IMPRESSION: 1. No acute cardiopulmonary process. Electronically signed by: Lynwood Seip MD 12/23/2023 06:07 PM EST RP Workstation: [id number]   DG Ribs Unilateral W/Chest Left Result Date: 12/22/2023 CLINICAL DATA:  Clemens, rib pain EXAM: LEFT RIBS AND CHEST - 3+ VIEW COMPARISON:  06/14/2023 FINDINGS: Frontal view of the chest as well as frontal and oblique views of the left thoracic cage are obtained. Stable enlargement of the cardiac silhouette. Mild vascular congestion without airspace disease, effusion, or pneumothorax. No acute displaced fractures. IMPRESSION: 1. Enlarged cardiac silhouette, with mild chronic pulmonary vascular congestion. 2. No acute intrathoracic process. 3. No displaced fracture. Electronically Signed   By: Ozell Daring M.D.   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 -Patient had a prior PE in November  2022 been off of anticoagulation x 1 year -She presented dyspneic tachycardic and tachypneic - CT of the chest showed central and segmental right-sided PE with trace left pleural effusion -Continue with heparin  infusion -She will need lifelong anticoagulation -Recommend hematology follow-up with recurrent PEs - Previously was on clopidogrel  75 mg daily, currently remains on hold as patient is on heparin  infusion -Supplemental oxygen as needed titrate FiO2 to maintain oxygen saturations greater than equal to 92%  Coronary  artery disease -Status post PCI and DES to the LAD in 2024 -High-sensitivity troponins trended flat not consistent with ACS -Echocardiogram ordered and pending with further recommendations to follow to assess wall motion -Previously scheduled for right and left heart catheterization for 12/25/2023 placed on hold in light of new finding of new PE - Clopidogrel  was placed on hold on admission to prevent bleeding when she was started on heparin  infusion -Not currently on statin therapy due to elevated LFTs -EKG as needed for pain or changes -Recommend restarting PTA [place]  Chronic kidney disease stage IIIa -Serum creatinine 1.20 -Baseline serum creatinine 1.2-1.4 -Monitor urine output -Monitor/trend/replete electrolytes as needed -Daily BMP -Avoid nephrotoxic agents were able  Hypertension -Blood pressure 117/101 -Continued on diltiazem  180 mg daily -Vital signs per unit protocol  Hyperlipidemia -Off statin therapy due to elevated LFTs while on statin therapy in the past -LDL 72 in 02/2023 -Can consider alternatives to statin therapy as an outpatient on follow-up  PSVT -Continue on telemetry monitoring -Continue PTA diltiazem   COPD -Not in acute exacerbation -Continued on home inhalers -Ongoing management per IM   Risk Assessment/Risk Scores:              For questions or updates, please contact [facility] Please consult [email or web address] for contact info under      Signed, Saurav Crumble, NP  12/24/2023 12:43 PM

## 2023-12-25 ENCOUNTER — Inpatient Hospital Stay: Admit: 2023-12-25 | Discharge: 2023-12-25 | Disposition: A | Attending: Cardiology | Admitting: Cardiology

## 2023-12-25 ENCOUNTER — Encounter: Admission: RE | Payer: Self-pay | Source: Home / Self Care

## 2023-12-25 ENCOUNTER — Ambulatory Visit: Admission: RE | Admit: 2023-12-25 | Source: Home / Self Care | Admitting: Internal Medicine

## 2023-12-25 DIAGNOSIS — R079 Chest pain, unspecified: Secondary | ICD-10-CM

## 2023-12-25 LAB — ECHOCARDIOGRAM COMPLETE
AR max vel: 3.32 cm2
AV Area VTI: 3.42 cm2
AV Area mean vel: 3.33 cm2
AV Mean grad: 2 mmHg
AV Peak grad: 3.1 mmHg
Ao pk vel: 0.88 m/s
Area-P 1/2: 2.39 cm2
Est EF: >55
Height: 64 in
MV VTI: 2.92 cm2
S' Lateral: 1.12 cm
Weight: 3199.32 [oz_av]

## 2023-12-25 LAB — BASIC METABOLIC PANEL WITH GFR
Anion gap: 14 (ref 5–15)
BUN: 11 mg/dL (ref 8–23)
CO2: 18 mmol/L — ABNORMAL LOW (ref 22–32)
Calcium: 8.7 mg/dL — ABNORMAL LOW (ref 8.9–10.3)
Chloride: 94 mmol/L — ABNORMAL LOW (ref 98–111)
Creatinine, Ser: 1.41 mg/dL — ABNORMAL HIGH (ref 0.44–1.00)
GFR, Estimated: 38 mL/min — ABNORMAL LOW (ref >60)
Glucose, Bld: 79 mg/dL (ref 70–99)
Potassium: 4.1 mmol/L (ref 3.5–5.1)
Sodium: 126 mmol/L — ABNORMAL LOW (ref 135–145)

## 2023-12-25 LAB — CBC
HCT: 33.7 % — ABNORMAL LOW (ref 36.0–46.0)
Hemoglobin: 11.2 g/dL — ABNORMAL LOW (ref 12.0–15.0)
MCH: 32.8 pg (ref 26.0–34.0)
MCHC: 33.2 g/dL (ref 30.0–36.0)
MCV: 98.8 fL (ref 80.0–100.0)
Platelets: 176 K/uL (ref 150–400)
RBC: 3.41 MIL/uL — ABNORMAL LOW (ref 3.87–5.11)
RDW: 14.2 % (ref 11.5–15.5)
WBC: 6.9 K/uL (ref 4.0–10.5)
nRBC: 0 % (ref 0.0–0.2)

## 2023-12-25 LAB — HEPARIN LEVEL (UNFRACTIONATED)
Heparin Unfractionated: 0.72 [IU]/mL — ABNORMAL HIGH (ref 0.30–0.70)
Heparin Unfractionated: 0.66 [IU]/mL (ref 0.30–0.70)

## 2023-12-25 SURGERY — RIGHT/LEFT HEART CATH AND CORONARY ANGIOGRAPHY
Anesthesia: Moderate Sedation | Laterality: Bilateral

## 2023-12-25 MED ORDER — ASPIRIN 81 MG PO CHEW
81.0000 mg | CHEWABLE_TABLET | Freq: Every day | ORAL | Status: DC
  Administered 2023-12-26 – 2023-12-28 (×3): 81 mg via ORAL
  Filled 2023-12-25 (×3): qty 1

## 2023-12-25 MED ORDER — DICLOFENAC SODIUM 1 % EX GEL
2.0000 g | Freq: Three times a day (TID) | CUTANEOUS | Status: DC
  Filled 2023-12-25: qty 100

## 2023-12-25 MED ORDER — DILTIAZEM HCL ER COATED BEADS 120 MG PO CP24
240.0000 mg | ORAL_CAPSULE | Freq: Every day | ORAL | Status: DC
  Administered 2023-12-26 – 2023-12-28 (×3): 240 mg via ORAL
  Filled 2023-12-25 (×3): qty 2

## 2023-12-25 MED ORDER — ROSUVASTATIN CALCIUM 5 MG PO TABS
5.0000 mg | ORAL_TABLET | Freq: Every day | ORAL | Status: DC
  Administered 2023-12-26 – 2023-12-28 (×3): 5 mg via ORAL
  Filled 2023-12-25 (×3): qty 1

## 2023-12-25 NOTE — Consult Note (Signed)
 PHARMACY - ANTICOAGULATION CONSULT NOTE  Pharmacy Consult for Heparin   Indication: pulmonary embolus  Patient Measurements: Height: 5' 4 (162.6 cm) Weight: 90.7 kg (199 lb 15.3 oz) IBW/kg (Calculated) : 54.7 HEPARIN  DW (KG): 75.1  Labs: Recent Labs    12/22/23 1606 12/23/23 1719 12/23/23 1939 12/24/23 0520 12/24/23 1416 12/25/23 0352  HGB 13.0 12.9  --  11.1*  --  11.2*  HCT 39.0 37.8  --  33.3*  --  33.7*  PLT 142* 143*  --  147*  --  176  APTT  --   --  27 65*  --   --   LABPROT  --   --  13.3  --   --   --   INR  --   --  1.0  --   --   --   HEPARINUNFRC  --   --   --  0.46 0.39 0.72*  CREATININE 1.41* 1.20*  --   --   --   --     Estimated Creatinine Clearance: 43.5 mL/min (A) (by C-G formula based on SCr of 1.2 mg/dL (H)).   Medical History: Past Medical History:  Diagnosis Date   Allergic rhinitis    Anxiety    Arthritis    Asthma    Atrophic vaginitis    B12 deficiency    Bronchitis, chronic (HCC)    CAD (coronary artery disease)    a. 03/2003 Cath: Nl cors; b. 09/2022 Staged PCI: LM nl, LAD 85p/57m (3.5x26 Onyx Frontier DES), RI small, mild dzs, LCX nl, OM2 mild dzs, RCA 30p.   Cervical disc disease    CKD (chronic kidney disease), stage III (HCC)    Collagen vascular disease    RA   COPD (chronic obstructive pulmonary disease) (HCC)    Cough - post-COVID    Depression    Diastolic dysfunction    a. 11/2020 Echo: EF 45-50%, GrI DD; b. 10/2021 Echo: EF 60-65%, GrII DD; c. 09/2022 Echo: EF 55-60%, no rwma, GrI DD, nl RV fxn, mildly dil LA. No significant valvular dzs; d. 04/2023 Echo: EF 60-65% no rwma, mild LVH, nl RV fxn, RVSP 23.72mmHg. AoV sclerosis.   Fibrocystic breast disease    Foot ulcer (HCC)    GERD (gastroesophageal reflux disease)    Hammer toe    Hyperlipidemia    IBS (irritable bowel syndrome)    Macrocytic anemia    Macular degeneration 2023   Nausea and vomiting 12/23/2023   Obesity    OSA (obstructive sleep apnea)    Osteoarthrosis     Multi site   Osteopenia    PSVT (paroxysmal supraventricular tachycardia)    a. 04/2022 Zio: Sinus rhythm @ 57 (42-101). Rare PACs/PVCs. 74290 SVT runs lasting up to 55m 12sec - max rate 222. 5 beats NSVT. Triggered events = sinus, SVT, PACs.   Rheumatoid arthritis (HCC)    Shingles    Thyroid  nodule    MULTIPLE   Tonsillitis    recurrent   Umbilical hernia    Assessment: Pt presents to the ED after a fall and increasing SOB last few weeks. Hx of aflutter, SVT, total replacement of hip, CAD, and CKD. Head CT, no note of bleed. Chest CT: Right-sided central and segmental pulmonary emboli. No right heart strain. CBC stable. No note of DOAC PTA. Pharmacy consulted to start heparin  infusion for PE.   Goal of Therapy:  Heparin  level 0.3-0.7 units/ml Monitor platelets by anticoagulation protocol: Yes   Plan:  --Heparin   level is slightly supratherapeutic --Decrease heparin  infusion to 1100 units/hr --Recheck HL in 8 hrs after rate change --CBC tomorrow AM  Rankin CANDIE Dills, PharmD, [facility] 12/25/2023 6:31 AM

## 2023-12-25 NOTE — Progress Notes (Signed)
*  PRELIMINARY RESULTS* Echocardiogram 2D Echocardiogram has been performed.  Floydene Harder 12/25/2023, 8:51 AM

## 2023-12-25 NOTE — TOC Initial Note (Signed)
 Transition of Care [facility]) - Initial/Assessment Note    Patient Details  Name: Courtney Wilcox MRN: [id number] Date of Birth: [birth year]  Transition of Care [facility]) CM/SW Contact:    Shasta DELENA Daring, RN Phone Number: 12/25/2023, 11:11 AM  Clinical Narrative:                 RNCM assess patient. Daughter at bedside. Patient was sitting in recliner. Appeared sleepy and would nod off during conversation. Some questions were answered by her daughter. Patient lives with daughter is single story home. Daughter provides transportation to appointments and will provide transportation at discharge. [facility] on [address] is insurance claims handler. Denies difficulty affording medication. Patient has DME in home including walker, wheelchair and shower chair x2.  Currently no HH services but expressed interest in having home PT to increase strength and mobility. Advised that, if appropriate, MD would enter orders and then PT and OT would assess and make recommendations.  TOC will follow.  Expected Discharge Plan: Home w Home Health Services Barriers to Discharge: Continued Medical Work up   Patient Goals and CMS Choice Patient states their goals for this hospitalization and ongoing recovery are:: Get stronger          Expected Discharge Plan and Services                                              Prior Living Arrangements/Services     Patient language and need for interpreter reviewed:: Yes        Need for Family Participation in Patient Care: Yes (Comment) Care giver support system in place?: Yes (comment)   Criminal Activity/Legal Involvement Pertinent to Current Situation/Hospitalization: No - Comment as needed  Activities of Daily Living   ADL Screening (condition at time of admission) Independently performs ADLs?: Yes (appropriate for developmental age) Is the patient deaf or have difficulty hearing?: No Does the patient have difficulty seeing, even when wearing glasses/contacts?:  Yes Does the patient have difficulty concentrating, remembering, or making decisions?: Yes  Permission Sought/Granted Permission sought to share information with : Case Manager, Family Supports Permission granted to share information with : Yes, Verbal Permission Granted  Share Information with NAME: Emmelia Holdsworth     Permission granted to share info w Relationship: Daughter     Emotional Assessment Appearance:: Appears stated age Attitude/Demeanor/Rapport: Lethargic, Gracious Affect (typically observed): Appropriate Orientation: : Oriented to Self, Oriented to Place Alcohol / Substance Use: Not Applicable    Admission diagnosis:  Recurrent pulmonary embolism (HCC) [I26.99] Other acute pulmonary embolism without acute cor pulmonale (HCC) [I26.99] Patient Active Problem List   Diagnosis Date Noted   Acute pulmonary embolism, recurrent (HCC) 12/23/2023   COPD (chronic obstructive pulmonary disease) (HCC) 12/23/2023   HTN (hypertension) 12/23/2023   Chronic kidney disease, stage 3a (HCC) 12/23/2023   Nausea and vomiting 12/23/2023   Contusion of chest wall, left, subsequent encounter 12/23/2023   OSA (obstructive sleep apnea) 09/06/2023   Hyperkalemia 09/06/2023   Incarcerated ventral hernia 09/04/2023   Acute respiratory failure with hypoxia (HCC) 04/25/2023   NSTEMI (non-ST elevated myocardial infarction) (HCC) 04/25/2023   SIRS (systemic inflammatory response syndrome) (HCC) 04/25/2023   Respiratory infection 04/25/2023   Sepsis (HCC) 04/25/2023   CAD S/P percutaneous coronary angioplasty 09/30/2022   Class 1 obesity due to excess calories with body mass index (BMI) of 34.0  to 34.9 in adult 09/28/2022   Unstable angina (HCC) 09/28/2022   Coronary artery disease due to calcified coronary lesion 09/28/2022   CKD stage 3b, GFR 30-44 ml/min (HCC) 09/28/2022   GERD without esophagitis 09/28/2022   History of pulmonary embolism 01/20/2022   Anemia in CKD (chronic kidney disease)  12/09/2021   LVH (left ventricular hypertrophy) due to hypertensive disease, without heart failure 09/15/2021   PSVT (paroxysmal supraventricular tachycardia) 11/19/2020   History of atrial flutter 11/19/2020   Chronic venous insufficiency 08/15/2020   Carotid stenosis 02/04/2020   Hyperlipidemia LDL goal <70 02/04/2020   Multiple thyroid  nodules 05/21/2018   Thrombocytopenia 07/31/2017   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/2017   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   Primary osteoarthritis of right hip 12/13/2016   S/P total knee arthroplasty 07/12/2016   Anemia of chronic disease 10/30/2013   Anxiety 10/30/2013   Osteoarthritis 07/08/2013   Osteopenia 07/08/2013   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 [place] [address] Phone: [phone] Fax: [phone]     Social Drivers of Health (SDOH) Social History: SDOH Screenings   Food Insecurity: No Food Insecurity (12/24/2023)  Housing: Low Risk  (12/23/2023)  Transportation Needs: No Transportation Needs (12/23/2023)  Utilities: Not At Risk (12/24/2023)  Depression (PHQ2-9): Low Risk  (11/14/2023)  Financial Resource Strain: Low Risk  (12/20/2023)   Received from [facility]  Social Connections: Moderately Isolated (12/23/2023)  Tobacco Use: Low Risk  (12/23/2023)   SDOH Interventions:     Readmission Risk Interventions    12/25/2023   11:07 AM  Readmission Risk Prevention Plan  Transportation Screening Complete  PCP or Specialist Appt within 3-5 Days Complete  HRI or Home Care Consult Complete  Social Work Consult for Recovery Care Planning/Counseling Complete  Palliative Care Screening Not Applicable   Medication Review Oceanographer) Complete

## 2023-12-25 NOTE — Consult Note (Signed)
 PHARMACY - ANTICOAGULATION CONSULT NOTE  Pharmacy Consult for Heparin   Indication: pulmonary embolus  Patient Measurements: Height: 5' 4 (162.6 cm) Weight: 90.7 kg (199 lb 15.3 oz) IBW/kg (Calculated) : 54.7 HEPARIN  DW (KG): 75.1  Labs: Recent Labs    12/22/23 1606 12/23/23 1719 12/23/23 1719 12/23/23 1939 12/24/23 0520 12/24/23 1416 12/25/23 0352 12/25/23 1454  HGB 13.0 12.9  --   --  11.1*  --  11.2*  --   HCT 39.0 37.8  --   --  33.3*  --  33.7*  --   PLT 142* 143*  --   --  147*  --  176  --   APTT  --   --   --  27 65*  --   --   --   LABPROT  --   --   --  13.3  --   --   --   --   INR  --   --   --  1.0  --   --   --   --   HEPARINUNFRC  --   --    < >  --  0.46 0.39 0.72* 0.66  CREATININE 1.41* 1.20*  --   --   --   --   --   --    < > = values in this interval not displayed.    Estimated Creatinine Clearance: 43.5 mL/min (A) (by C-G formula based on SCr of 1.2 mg/dL (H)).   Medical History: Past Medical History:  Diagnosis Date   Allergic rhinitis    Anxiety    Arthritis    Asthma    Atrophic vaginitis    B12 deficiency    Bronchitis, chronic (HCC)    CAD (coronary artery disease)    a. 03/2003 Cath: Nl cors; b. 09/2022 Staged PCI: LM nl, LAD 85p/25m (3.5x26 Onyx Frontier DES), RI small, mild dzs, LCX nl, OM2 mild dzs, RCA 30p.   Cervical disc disease    CKD (chronic kidney disease), stage III (HCC)    Collagen vascular disease    RA   COPD (chronic obstructive pulmonary disease) (HCC)    Cough - post-COVID    Depression    Diastolic dysfunction    a. 11/2020 Echo: EF 45-50%, GrI DD; b. 10/2021 Echo: EF 60-65%, GrII DD; c. 09/2022 Echo: EF 55-60%, no rwma, GrI DD, nl RV fxn, mildly dil LA. No significant valvular dzs; d. 04/2023 Echo: EF 60-65% no rwma, mild LVH, nl RV fxn, RVSP 23.70mmHg. AoV sclerosis.   Fibrocystic breast disease    Foot ulcer (HCC)    GERD (gastroesophageal reflux disease)    Hammer toe    Hyperlipidemia    IBS (irritable bowel  syndrome)    Macrocytic anemia    Macular degeneration 2023   Nausea and vomiting 12/23/2023   Obesity    OSA (obstructive sleep apnea)    Osteoarthrosis    Multi site   Osteopenia    PSVT (paroxysmal supraventricular tachycardia)    a. 04/2022 Zio: Sinus rhythm @ 57 (42-101). Rare PACs/PVCs. 74290 SVT runs lasting up to 38m 12sec - max rate 222. 5 beats NSVT. Triggered events = sinus, SVT, PACs.   Rheumatoid arthritis (HCC)    Shingles    Thyroid  nodule    MULTIPLE   Tonsillitis    recurrent   Umbilical hernia    Assessment: Pt presents to the ED after a fall and increasing SOB last few weeks. Hx of aflutter,  SVT, total replacement of hip, CAD, and CKD. Head CT, no note of bleed. Chest CT: Right-sided central and segmental pulmonary emboli. No right heart strain. CBC stable. No note of DOAC PTA. Pharmacy consulted to start heparin  infusion for PE.   Goal of Therapy:  Heparin  level 0.3-0.7 units/ml Monitor platelets by anticoagulation protocol: Yes   12/9 1454 HL 0.66, therapeutic x 1  Plan:  HL therapeutic x 1 Continue heparin  infusion at 1100 units/hr Recheck HL in 8 hours to confirm Daily CBC while on heparin   Kayla JULIANNA Blew, PharmD, BCPS 12/25/2023 3:39 PM

## 2023-12-25 NOTE — Progress Notes (Signed)
 Rounding Note   Patient Name: Courtney Wilcox Date of Encounter: 12/25/2023  [facility] Cardiologist: Lonni Hanson, MD   Subjective Patient seen on rounds.  Daughter remains at the bedside.  Denies any chest pain or shortness of breath today.  Sitting upright in the recliner.  Continued on heparin  infusion.  Scheduled Meds:  diltiazem   180 mg Oral Daily   fluticasone  furoate-vilanterol  1 puff Inhalation Daily   pantoprazole   40 mg Oral QHS   sertraline   50 mg Oral Daily   Continuous Infusions:  heparin  1,100 Units/hr (12/25/23 0650)   promethazine  (PHENERGAN ) injection (IM or IVPB) 12.5 mg (12/24/23 0414)   PRN Meds: acetaminophen  **OR** acetaminophen , famotidine , gabapentin , HYDROcodone -acetaminophen , ipratropium-albuterol , LORazepam , metoprolol  tartrate, ondansetron  **OR** ondansetron  (ZOFRAN ) IV, mouth rinse, promethazine  (PHENERGAN ) injection (IM or IVPB)   Vital Signs  Vitals:   12/25/23 0032 12/25/23 0416 12/25/23 0854 12/25/23 0919  BP: (!) 164/91 (!) 152/138 119/80 119/80  Pulse: 75 70 84   Resp: 20 (!) 25 19   Temp: 97.8 F (36.6 C) 98.3 F (36.8 C) 98.4 F (36.9 C)   TempSrc:      SpO2: 95% 100% 100%   Weight:      Height:        Intake/Output Summary (Last 24 hours) at 12/25/2023 1011 Last data filed at 12/24/2023 1900 Gross per 24 hour  Intake 210.73 ml  Output 100 ml  Net 110.73 ml      12/23/2023    5:16 PM 12/22/2023    3:52 PM 11/30/2023    3:19 PM  Last 3 Weights  Weight (lbs) 199 lb 15.3 oz 200 lb 198 lb 2 oz  Weight (kg) 90.7 kg 90.719 kg 89.869 kg      Telemetry Sinus to sinus tach with rates 90-100 with occasional PVC's and PAC's and several burst of PSVT- Personally Reviewed  ECG  No new tracings - Personally Reviewed  Physical Exam  GEN: No acute distress.   Neck: Unable to determine JVD due to body habitus Cardiac: RRR, II/VI systolic murmur, without rubs, or gallops.  Respiratory: Clear to auscultation  bilaterally. GI: Soft, nontender, non-distended  MS: No edema; No deformity. Neuro:  Nonfocal  Psych: Normal affect   Labs High Sensitivity Troponin:  No results for input(s): TROPONINIHS in the last 720 hours.   Chemistry Recent Labs  Lab 12/22/23 1606 12/23/23 1719  NA 132* 131*  K 4.2 4.5  CL 95* 93*  CO2 26 27  GLUCOSE 117* 93  BUN 17 14  CREATININE 1.41* 1.20*  CALCIUM  8.7* 9.0  PROT 5.8* 6.1*  ALBUMIN 3.6 3.8  AST 25 28  ALT 24 25  ALKPHOS 88 91  BILITOT 0.7 0.7  GFRNONAA 38* 47*  ANIONGAP 11 11    Lipids No results for input(s): CHOL, TRIG, HDL, LABVLDL, LDLCALC, CHOLHDL in the last 168 hours.  Hematology Recent Labs  Lab 12/23/23 1719 12/24/23 0520 12/25/23 0352  WBC 7.9 6.7 6.9  RBC 3.85* 3.37* 3.41*  HGB 12.9 11.1* 11.2*  HCT 37.8 33.3* 33.7*  MCV 98.2 98.8 98.8  MCH 33.5 32.9 32.8  MCHC 34.1 33.3 33.2  RDW 14.2 13.9 14.2  PLT 143* 147* 176   Thyroid  No results for input(s): TSH, FREET4 in the last 168 hours.  BNP Recent Labs  Lab 12/23/23 1719  PROBNP 272.0    DDimer No results for input(s): DDIMER in the last 168 hours.   Radiology    Cardiac Studies Echocardiogram ordered  and pending  Patient Profile   78 y.o. 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  Assessment & Plan  Acute pulmonary embolism -Continue anticoagulation with heparin  infusion -Echocardiogram pending to evaluate RV function -She would likely require lifelong anticoagulation given second episode of pulmonary embolism -Recommend ongoing management outpatient by hematology -Hemoglobin remained stable at 11.2 -Will need to be transitioned to OAC prior to discharge, timing will be  determined on if [facility] will be done during hospitalization  Coronary artery disease involving the native coronary arteries with other forms of angina -Status post PCI/DES to the LAD in 2024 -High-sensitivity troponins trended flat not consistent with ACS -Echocardiogram ordered and pending with further recommendations to follow -Previously scheduled right and left heart catheterization 12/25/2023 placed on hold due to acute PE -EKG as needed for pain or changes -Not currently on statin therapy due to intolerance -Maintain on heparin  infusion - Will consider completing right and left heart catheterization during hospitalization to clarify her symptoms - Previous cardiac catheterization could not be done to the right radial artery due to significant tuberosity and decreased radial pulse overall will likely have to use femoral access -Continue on telemetry monitoring  PSVT -Several burst of SVT noted on telemetry throughout the day -Diltiazem  dosing increased 580 mg daily to 240 mg daily -Continue with telemetry monitoring  Chronic kidney disease stage IIIa -Serum creatinine 1.20, no BMP since 12/7 repeat ordered this morning -Baseline serum creatinine 1.2-1.4 -Monitor urine output -Monitor/trend/replete electrolytes as needed -Daily BMP -Avoid nephrotoxic agents were able  Hypertension -Blood pressure 119/80 -Continued on diltiazem  180 mg daily -Vital signs per unit protocol  Hyperlipidemia -Off statin therapy due to elevated LFTs -LDL 72 in 02/2023 -Consider alternatives to statin therapy as an outpatient on follow-up  COPD -Not in acute exacerbation -Ongoing management per IM   For questions or updates, please contact [facility] Please consult [email or web address] for contact info under       Signed, Robert Sperl, NP  12/25/2023, 10:11 AM

## 2023-12-25 NOTE — Progress Notes (Addendum)
 PROGRESS NOTE    Courtney Wilcox  FMW:[id number] DOB: 08-Nov-1945 DOA: 12/23/2023 PCP: Marikay Eva POUR, PA  [id number]  LOS: 1 day   Brief hospital course:   Assessment & Plan: Courtney Wilcox is a 78 y.o. female with medical history significant for HTN, CAD s/p stent (09/2022), rheumatoid arthritis on chronic immunosuppressants and prednisone , CKD llla, SVT, COPD, OSA, prior recurrent PE (off Eliquis  x 1 year due to being on DAPT), being admitted with a right sided pulmonary embolism(central and segmental).  History provided by the daughter who states that for the past week she has been feeling very weak and having nausea, in the setting of a 23-month history of ongoing shortness of breath currently being evaluated by pulmonology and cardiology.  At baseline she is independent but about 4 days ago she fell forward onto her face and left side while she was trying to sit on a couch and since then her condition seem to worsen.  She developed worsening shortness of breath and also had left-sided chest pain for which she was seen in the ED on 12/22/2023.  Last night she developed vomiting which she attributed to the pain medicine and due to feeling poorly she requested to come back to the emergency room.  Of note, patient was evaluated with a CTA chest a month ago  was negative for PE and she has an upcoming LHC/RHC to further evaluate for pulmonary hypertension due to months of shortness of breath.  She is currently completing a tapering dexamethasone  pack ordered by her pulmonologist prior to going back on her daily prednisone  5 mg..    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 Current CT abdomen and pelvis unrevealing --GI consulted.  N/V improved.  No plan for EGD during this hospitalization.  Dyspnea --being worked up by outpatient cardio and pulm Dr. Parris --consult cardio and pulm, per daughters' request --cardio to decide on inpatient heart cath.  Pt made NPO MN just in case.  Contusion of chest wall, left, subsequent encounter History of fall off couch hitting left chest and abdomen --no rib fracture --Norco PRN --diclofenac  gel TID  CAD S/P percutaneous coronary angioplasty Has been on DAPT since stent 09/2022 --consult cardiology for decision on resumption of antiplatelet of choice  Rheumatoid arthritis (HCC) On chronic immunosuppressive therapy Will hold meds while in the house  Chronic kidney disease, stage 3a (HCC) Renal function at baseline  HTN (hypertension) --cont Cardizem   COPD (chronic obstructive pulmonary disease) (HCC) Not acutely exacerbated --cont bronchodilators  PSVT (paroxysmal supraventricular tachycardia) No acute issues suspected Continuous cardiac monitoring  Chronic venous insufficiency No acute issues  Hyponatremia --worsening --monitor Na  Obesity class I, 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   Objective:  Vitals:   12/25/23 0919 12/25/23 1155 12/25/23 1540 12/25/23 1923  BP: 119/80 132/72 123/74 109/72  Pulse:  78 68 (!) 112  Resp:  16  20  Temp:  98.2 F (36.8 C) 98 F (36.7 C) 97.9 F (36.6 C)  TempSrc:      SpO2:  99%  99% 100%  Weight:      Height:        Intake/Output Summary (Last 24 hours) at 12/25/2023 2127 Last data filed at 12/25/2023 2030 Gross per 24 hour  Intake 240 ml  Output 900 ml  Net -660 ml   Filed Weights   12/23/23 1716  Weight: 90.7 kg    Examination:   Constitutional: NAD, AAOx3 HEENT: conjunctivae and lids normal, EOMI CV: No cyanosis.   RESP: normal respiratory effort Neuro: II - XII grossly intact.   Psych: Normal mood and affect.  Appropriate judgement and reason   Data Reviewed: I have personally reviewed labs and imaging studies  Time spent: 50 minutes  Ellouise Haber, MD [facility] If 7PM-7AM, please contact night-coverage 12/25/2023, 9:27 PM

## 2023-12-25 NOTE — Progress Notes (Signed)
 Courtney Copping, MD [facility]   [address] [place], [place] [address] Phone: [phone] Fax : [phone]   Subjective: The patient is accompanied by her daughter today and is lying comfortably in a recliner.  She is and the daughter reports that the patient has had no nausea today and is feeling much better.  The patient is being followed by cardiology for possible cath soon.   Objective: Vital signs in last 24 hours: Vitals:   12/25/23 0416 12/25/23 0854 12/25/23 0919 12/25/23 1155  BP: (!) 152/138 119/80 119/80 132/72  Pulse: 70 84  78  Resp: (!) 25 19  16   Temp: 98.3 F (36.8 C) 98.4 F (36.9 C)  98.2 F (36.8 C)  TempSrc:      SpO2: 100% 100%  99%  Weight:      Height:       Weight change:   Intake/Output Summary (Last 24 hours) at 12/25/2023 1450 Last data filed at 12/25/2023 1155 Gross per 24 hour  Intake 450.73 ml  Output 200 ml  Net 250.73 ml     Exam: General: Patient sitting up in a chair in no apparent distress  Lab Results: @LABTEST2 @ Micro Results: No results found for this or any previous visit (from the past 240 hours). Studies/Results: ECHOCARDIOGRAM COMPLETE Result Date: 12/25/2023    ECHOCARDIOGRAM REPORT   Patient Name:   Courtney Wilcox Date of Exam: 12/25/2023 Medical Rec #:  [id number]         Height:       64.0 in Accession #:    [id number]        Weight:       200.0 lb Date of Birth:  [birth year]         BSA:          1.956 m Patient Age:    78 years          BP:           152/138 mmHg Patient Gender: F                 HR:           70 bpm. Exam Location:  [facility] Procedure: 2D Echo, Cardiac Doppler and Color Doppler (Both Spectral and Color            Flow Doppler were utilized during procedure). Indications:     Dyspnea R06.00                  Pulmonary embolus I26.09  History:         Patient has prior history of Echocardiogram examinations, most                  recent 04/26/2023. COPD; Risk Factors:Dyslipidemia and Sleep                   Apnea. Chronci kidney disease.  Sonographer:     Christopher Furnace Referring Phys:  [id number] SHERI HAMMOCK Diagnosing Phys: Lonni Hanson MD  Sonographer Comments: Technically challenging study due to limited acoustic windows. Image acquisition challenging due to COPD. IMPRESSIONS  1. Left ventricular ejection fraction, by estimation, is >55%. The left ventricle has normal function. Left ventricular endocardial border not optimally defined to evaluate regional wall motion. There is moderate left ventricular hypertrophy. Left ventricular diastolic parameters are consistent with Grade I diastolic dysfunction (impaired relaxation).  2. Right ventricular systolic function is moderately reduced. The right ventricular size is normal. Moderately  increased right ventricular wall thickness.  3. The mitral valve was not well visualized. No evidence of mitral valve regurgitation. No evidence of mitral stenosis.  4. The aortic valve was not well visualized. Aortic valve regurgitation is not visualized. No aortic stenosis is present. FINDINGS  Left Ventricle: Left ventricular ejection fraction, by estimation, is >55%. The left ventricle has normal function. Left ventricular endocardial border not optimally defined to evaluate regional wall motion. The left ventricular internal cavity size was  normal in size. There is moderate left ventricular hypertrophy. Left ventricular diastolic parameters are consistent with Grade I diastolic dysfunction (impaired relaxation). Right Ventricle: The right ventricular size is normal. Moderately increased right ventricular wall thickness. Right ventricular systolic function is moderately reduced. Left Atrium: Left atrial size was normal in size. Right Atrium: Right atrial size was normal in size. Pericardium: Trivial pericardial effusion is present. Presence of epicardial fat layer. Mitral Valve: The mitral valve was not well visualized. No evidence of mitral valve regurgitation. No evidence of  mitral valve stenosis. MV peak gradient, 4.4 mmHg. The mean mitral valve gradient is 2.0 mmHg. Tricuspid Valve: The tricuspid valve is not well visualized. Tricuspid valve regurgitation is trivial. Aortic Valve: The aortic valve was not well visualized. Aortic valve regurgitation is not visualized. No aortic stenosis is present. Aortic valve mean gradient measures 2.0 mmHg. Aortic valve peak gradient measures 3.1 mmHg. Aortic valve area, by VTI measures 3.42 cm. Pulmonic Valve: The pulmonic valve was not well visualized. Pulmonic valve regurgitation is not visualized. No evidence of pulmonic stenosis. Aorta: The aortic root is normal in size and structure. Pulmonary Artery: The pulmonary artery is not well seen. IAS/Shunts: The interatrial septum was not well visualized.  LEFT VENTRICLE PLAX 2D LVIDd:         2.73 cm   Diastology LVIDs:         1.12 cm   LV e' medial:    3.92 cm/s LV PW:         1.59 cm   LV E/e' medial:  13.4 LV IVS:        1.56 cm   LV e' lateral:   8.38 cm/s LVOT diam:     2.20 cm   LV E/e' lateral: 6.3 LV SV:         54 LV SV Index:   28 LVOT Area:     3.80 cm  RIGHT VENTRICLE RV Basal diam:  2.70 cm RV Mid diam:    2.60 cm RV S prime:     8.38 cm/s TAPSE (M-mode): 1.4 cm LEFT ATRIUM             Index        RIGHT ATRIUM           Index LA diam:        4.50 cm 2.30 cm/m   RA Area:     11.70 cm LA Vol (A2C):   25.6 ml 13.06 ml/m  RA Volume:   23.50 ml  12.01 ml/m LA Vol (A4C):   24.0 ml 12.27 ml/m LA Biplane Vol: 27.7 ml 14.16 ml/m  AORTIC VALVE AV Area (Vmax):    3.32 cm AV Area (Vmean):   3.33 cm AV Area (VTI):     3.42 cm AV Vmax:           87.50 cm/s AV Vmean:          59.800 cm/s AV VTI:  0.159 m AV Peak Grad:      3.1 mmHg AV Mean Grad:      2.0 mmHg LVOT Vmax:         76.40 cm/s LVOT Vmean:        52.400 cm/s LVOT VTI:          0.143 m LVOT/AV VTI ratio: 0.90  AORTA Ao Root diam: 3.10 cm MITRAL VALVE               TRICUSPID VALVE MV Area (PHT): 2.39 cm    TR Peak  grad:   12.2 mmHg MV Area VTI:   2.92 cm    TR Vmax:        175.00 cm/s MV Peak grad:  4.4 mmHg MV Mean grad:  2.0 mmHg    SHUNTS MV Vmax:       1.05 m/s    Systemic VTI:  0.14 m MV Vmean:      63.2 cm/s   Systemic Diam: 2.20 cm MV Decel Time: 317 msec MV E velocity: 52.70 cm/s MV A velocity: 66.40 cm/s MV E/A ratio:  0.79 Lonni End MD Electronically signed by Lonni Hanson MD Signature Date/Time: 12/25/2023/12:39:30 PM    Final    CT Angio Chest PE W and/or Wo Contrast Result Date: 12/23/2023 CLINICAL DATA:  Clemens yesterday, nausea and vomiting, weakness, increasing shortness of breath EXAM: CT ANGIOGRAPHY CHEST CT ABDOMEN AND PELVIS WITH CONTRAST TECHNIQUE: Multidetector CT imaging of the chest was performed using the standard protocol during bolus administration of intravenous contrast. Multiplanar CT image reconstructions and MIPs were obtained to evaluate the vascular anatomy. Multidetector CT imaging of the abdomen and pelvis was performed using the standard protocol during bolus administration of intravenous contrast. RADIATION DOSE REDUCTION: This exam was performed according to the departmental dose-optimization program which includes automated exposure control, adjustment of the mA and/or kV according to patient size and/or use of iterative reconstruction technique. CONTRAST:  OMNIPAQUE  IOHEXOL  350 MG/ML SOLN COMPARISON:  12/23/2023, 11/21/2023, 09/04/2023 FINDINGS: CTA CHEST FINDINGS Cardiovascular: This is a technically adequate evaluation of the pulmonary vasculature. Right-sided central and lobar pulmonary emboli are identified. No evidence of right heart strain. Lipomatous hypertrophy of the interatrial septum is again noted. No pericardial effusion. Normal caliber of the thoracic aorta. Stable atherosclerosis of the aorta and coronary vasculature. Mediastinum/Nodes: Stable appearance of the thyroid . Trachea and esophagus are unremarkable. No pathologic adenopathy. Lungs/Pleura: Trace  left pleural effusion. No acute airspace disease or pneumothorax. Dependent hypoventilatory changes are seen within the lower lobes. Musculoskeletal: No acute or destructive bony abnormalities. Reconstructed images demonstrate no additional findings. Review of the MIP images confirms the above findings. CT ABDOMEN and PELVIS FINDINGS Hepatobiliary: No focal liver abnormality is seen. No gallstones, gallbladder wall thickening, or biliary dilatation. Pancreas: Unremarkable. No pancreatic ductal dilatation or surrounding inflammatory changes. Spleen: Normal in size without focal abnormality. Adrenals/Urinary Tract: Bilateral renal cortical atrophy again noted. No abnormal renal enhancement. No urinary tract calculi or obstructive uropathy. The adrenals are stable. Bladder is decompressed, limiting its evaluation. Stomach/Bowel: No bowel obstruction or ileus. Normal appendix right lower quadrant. Sigmoid diverticulosis without evidence of acute diverticulitis. No bowel wall thickening or inflammatory change. Vascular/Lymphatic: No significant vascular findings are present. No enlarged abdominal or pelvic lymph nodes. Reproductive: Uterus and bilateral adnexa are unremarkable. Other: No free fluid or free intraperitoneal gas. No abdominal wall hernia. Musculoskeletal: Unremarkable right hip arthroplasty. There are no acute or destructive bony abnormalities. Reconstructed images demonstrate no additional  findings. Review of the MIP images confirms the above findings. IMPRESSION: Chest: 1. Right-sided central and segmental pulmonary emboli. No right heart strain. 2. Trace left pleural effusion. 3. Aortic Atherosclerosis (ICD10-I70.0). Coronary artery atherosclerosis. Abdomen/pelvis: 1. No acute intra-abdominal or intrapelvic process. 2. Sigmoid diverticulosis without diverticulitis. Critical Value/emergent results were called by telephone at the time of interpretation on 12/23/2023 at 7:09 pm to provider PHILLIP STAFFORD ,  who verbally acknowledged these results. Electronically Signed   By: Ozell Daring M.D.   On: 12/23/2023 19:12   CT ABDOMEN PELVIS W CONTRAST Result Date: 12/23/2023 CLINICAL DATA:  Clemens yesterday, nausea and vomiting, weakness, increasing shortness of breath EXAM: CT ANGIOGRAPHY CHEST CT ABDOMEN AND PELVIS WITH CONTRAST TECHNIQUE: Multidetector CT imaging of the chest was performed using the standard protocol during bolus administration of intravenous contrast. Multiplanar CT image reconstructions and MIPs were obtained to evaluate the vascular anatomy. Multidetector CT imaging of the abdomen and pelvis was performed using the standard protocol during bolus administration of intravenous contrast. RADIATION DOSE REDUCTION: This exam was performed according to the departmental dose-optimization program which includes automated exposure control, adjustment of the mA and/or kV according to patient size and/or use of iterative reconstruction technique. CONTRAST:  OMNIPAQUE  IOHEXOL  350 MG/ML SOLN COMPARISON:  12/23/2023, 11/21/2023, 09/04/2023 FINDINGS: CTA CHEST FINDINGS Cardiovascular: This is a technically adequate evaluation of the pulmonary vasculature. Right-sided central and lobar pulmonary emboli are identified. No evidence of right heart strain. Lipomatous hypertrophy of the interatrial septum is again noted. No pericardial effusion. Normal caliber of the thoracic aorta. Stable atherosclerosis of the aorta and coronary vasculature. Mediastinum/Nodes: Stable appearance of the thyroid . Trachea and esophagus are unremarkable. No pathologic adenopathy. Lungs/Pleura: Trace left pleural effusion. No acute airspace disease or pneumothorax. Dependent hypoventilatory changes are seen within the lower lobes. Musculoskeletal: No acute or destructive bony abnormalities. Reconstructed images demonstrate no additional findings. Review of the MIP images confirms the above findings. CT ABDOMEN and PELVIS FINDINGS  Hepatobiliary: No focal liver abnormality is seen. No gallstones, gallbladder wall thickening, or biliary dilatation. Pancreas: Unremarkable. No pancreatic ductal dilatation or surrounding inflammatory changes. Spleen: Normal in size without focal abnormality. Adrenals/Urinary Tract: Bilateral renal cortical atrophy again noted. No abnormal renal enhancement. No urinary tract calculi or obstructive uropathy. The adrenals are stable. Bladder is decompressed, limiting its evaluation. Stomach/Bowel: No bowel obstruction or ileus. Normal appendix right lower quadrant. Sigmoid diverticulosis without evidence of acute diverticulitis. No bowel wall thickening or inflammatory change. Vascular/Lymphatic: No significant vascular findings are present. No enlarged abdominal or pelvic lymph nodes. Reproductive: Uterus and bilateral adnexa are unremarkable. Other: No free fluid or free intraperitoneal gas. No abdominal wall hernia. Musculoskeletal: Unremarkable right hip arthroplasty. There are no acute or destructive bony abnormalities. Reconstructed images demonstrate no additional findings. Review of the MIP images confirms the above findings. IMPRESSION: Chest: 1. Right-sided central and segmental pulmonary emboli. No right heart strain. 2. Trace left pleural effusion. 3. Aortic Atherosclerosis (ICD10-I70.0). Coronary artery atherosclerosis. Abdomen/pelvis: 1. No acute intra-abdominal or intrapelvic process. 2. Sigmoid diverticulosis without diverticulitis. Critical Value/emergent results were called by telephone at the time of interpretation on 12/23/2023 at 7:09 pm to provider PHILLIP STAFFORD , who verbally acknowledged these results. Electronically Signed   By: Ozell Daring M.D.   On: 12/23/2023 19:12   CT Head Wo Contrast Result Date: 12/23/2023 EXAM: CT HEAD WITHOUT CONTRAST 12/23/2023 06:48:03 PM TECHNIQUE: CT of the head was performed without the administration of intravenous contrast. Automated exposure control,  iterative  reconstruction, and/or weight based adjustment of the mA/kV was utilized to reduce the radiation dose to as low as reasonably achievable. COMPARISON: None available. CLINICAL HISTORY: Head trauma, minor (Age >= 65y) FINDINGS: BRAIN AND VENTRICLES: No acute hemorrhage. No evidence of acute infarct. No hydrocephalus. No extra-axial collection. No mass effect or midline shift. 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 Patchy white matter hypodensities are compatible with chronic microvascular ischemic change. Remote right thalamic lacunar infarct. ORBITS: No acute abnormality. SINUSES: No acute abnormality. SOFT TISSUES AND SKULL: No acute soft tissue abnormality. No skull fracture. IMPRESSION: 1. No acute intracranial abnormality. 2. 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 Electronically signed by: Gilmore Molt MD 12/23/2023 06:58 PM EST RP Workstation: [id number]   DG Chest 2 View Result Date: 12/23/2023 EXAM: 2 VIEW(S) XRAY OF THE CHEST 12/23/2023 05:52:00 PM COMPARISON: Yesterday. CLINICAL HISTORY: SOB. FINDINGS: LUNGS AND PLEURA: No focal pulmonary opacity. No pleural effusion. No pneumothorax. HEART AND MEDIASTINUM: Stable cardiomediastinal silhouette. BONES AND SOFT TISSUES: No acute osseous abnormality. IMPRESSION: 1. No acute cardiopulmonary process. Electronically signed by: Lynwood Seip MD 12/23/2023 06:07 PM EST RP Workstation: [id number]   Medications: I have reviewed the patient's current medications. Scheduled Meds:  [START ON 12/26/2023] diltiazem   240 mg Oral Daily   fluticasone  furoate-vilanterol  1 puff Inhalation Daily   pantoprazole   40 mg Oral QHS   sertraline   50 mg Oral Daily   Continuous Infusions:  heparin  1,100 Units/hr (12/25/23 1326)   promethazine  (PHENERGAN ) injection (IM or IVPB) 12.5 mg (12/24/23 0414)   PRN Meds:.acetaminophen   **OR** acetaminophen , famotidine , gabapentin , HYDROcodone -acetaminophen , ipratropium-albuterol , LORazepam , metoprolol  tartrate, ondansetron  **OR** ondansetron  (ZOFRAN ) IV, mouth rinse, promethazine  (PHENERGAN ) injection (IM or IVPB)   Assessment: Principal Problem:   Acute pulmonary embolism, recurrent (HCC) Active Problems:   Rheumatoid arthritis (HCC)   Chronic venous insufficiency   PSVT (paroxysmal supraventricular tachycardia)   History of pulmonary embolism   CAD S/P percutaneous coronary angioplasty   COPD (chronic obstructive pulmonary disease) (HCC)   HTN (hypertension)   Chronic kidney disease, stage 3a (HCC)   Nausea and vomiting   Contusion of chest wall, left, subsequent encounter    Plan: The patient is doing better today without any further nausea.  The patient is being worked up by cardiology and may undergo a catheterization in the next few days.  The patient has had no further GI symptoms and the nausea was likely due to her ongoing other medical problems.  If any of the patient's symptoms return please do not hesitate to call.  I will sign off.  Please call if any further GI concerns or questions.  We would like to thank you for the opportunity to participate in the care of Courtney Wilcox.    LOS: 1 day   Courtney Copping, MD.FACG 12/25/2023, 2:50 PM Pager [phone] 7am-5pm  Check AMION for 5pm -7am coverage and on weekends

## 2023-12-26 ENCOUNTER — Inpatient Hospital Stay

## 2023-12-26 ENCOUNTER — Other Ambulatory Visit: Payer: Self-pay

## 2023-12-26 DIAGNOSIS — J432 Centrilobular emphysema: Secondary | ICD-10-CM

## 2023-12-26 DIAGNOSIS — S20212D Contusion of left front wall of thorax, subsequent encounter: Secondary | ICD-10-CM

## 2023-12-26 DIAGNOSIS — Z86711 Personal history of pulmonary embolism: Secondary | ICD-10-CM

## 2023-12-26 DIAGNOSIS — R112 Nausea with vomiting, unspecified: Secondary | ICD-10-CM | POA: Diagnosis not present

## 2023-12-26 DIAGNOSIS — I25118 Atherosclerotic heart disease of native coronary artery with other forms of angina pectoris: Secondary | ICD-10-CM

## 2023-12-26 LAB — CBC
HCT: 32.1 % — ABNORMAL LOW (ref 36.0–46.0)
Hemoglobin: 11.1 g/dL — ABNORMAL LOW (ref 12.0–15.0)
MCH: 33.7 pg (ref 26.0–34.0)
MCHC: 34.6 g/dL (ref 30.0–36.0)
MCV: 97.6 fL (ref 80.0–100.0)
Platelets: 213 K/uL (ref 150–400)
RBC: 3.29 MIL/uL — ABNORMAL LOW (ref 3.87–5.11)
RDW: 14.6 % (ref 11.5–15.5)
WBC: 6.7 K/uL (ref 4.0–10.5)
nRBC: 0 % (ref 0.0–0.2)

## 2023-12-26 LAB — BASIC METABOLIC PANEL WITH GFR
Anion gap: 12 (ref 5–15)
BUN: 11 mg/dL (ref 8–23)
CO2: 24 mmol/L (ref 22–32)
Calcium: 8.6 mg/dL — ABNORMAL LOW (ref 8.9–10.3)
Chloride: 99 mmol/L (ref 98–111)
Creatinine, Ser: 1.37 mg/dL — ABNORMAL HIGH (ref 0.44–1.00)
GFR, Estimated: 40 mL/min — ABNORMAL LOW (ref >60)
Glucose, Bld: 81 mg/dL (ref 70–99)
Potassium: 3.7 mmol/L (ref 3.5–5.1)
Sodium: 135 mmol/L (ref 135–145)

## 2023-12-26 LAB — HEPARIN LEVEL (UNFRACTIONATED)
Heparin Unfractionated: 0.53 [IU]/mL (ref 0.30–0.70)
Heparin Unfractionated: 0.55 [IU]/mL (ref 0.30–0.70)

## 2023-12-26 LAB — MAGNESIUM: Magnesium: 1.9 mg/dL (ref 1.7–2.4)

## 2023-12-26 MED ORDER — ASPIRIN 81 MG PO CHEW: 81.0000 mg | CHEWABLE_TABLET | ORAL | Status: DC

## 2023-12-26 MED ORDER — SODIUM CHLORIDE 0.9 % WEIGHT BASED INFUSION: 3.0000 mL/kg/h | INTRAVENOUS | Status: AC

## 2023-12-26 MED ORDER — SODIUM CHLORIDE 0.9 % WEIGHT BASED INFUSION
1.0000 mL/kg/h | INTRAVENOUS | Status: DC
  Administered 2023-12-27: 1 mL/kg/h via INTRAVENOUS

## 2023-12-26 MED ORDER — EZETIMIBE 10 MG PO TABS
10.0000 mg | ORAL_TABLET | Freq: Every day | ORAL | Status: DC
  Administered 2023-12-26 – 2023-12-28 (×3): 10 mg via ORAL
  Filled 2023-12-26 (×3): qty 1

## 2023-12-26 NOTE — Telephone Encounter (Signed)
 Call to daughter (ok per DPR), let her know that cath is still scheduled for tomorrow at 3pm and Dr Anner will make contact with the family at bedside or in the waiting area - reminded to update RN in Specials as to preference/lacation

## 2023-12-26 NOTE — Progress Notes (Signed)
 PROGRESS NOTE    Courtney Wilcox   FMW:[id number] DOB: [birth year]  DOA: 12/23/2023 Date of Service: 12/26/23 which is hospital day 2  PCP: Courtney Wilcox, [facility] [place] course / significant events:   HPI: Courtney Wilcox is a 78 y.o. female with medical history significant for HTN, CAD s/p stent (09/2022), rheumatoid arthritis on chronic immunosuppressants and prednisone , CKD llla, SVT, COPD, OSA, prior recurrent PE (off Eliquis  x 1 year due to being on DAPT). Presented to ED w/ N/V unable to tolerate po, generalized weakness. Also noting upcoming appt for cardiac cath to rule out pulmonary HTN and has been increasingly SOB over last few weeks. SOB overall worse since hernia surgery 09/2023  Of note, recently in ED 12/06 dx w/ concern for L rib fx probably ater fall a week prior. The fall was described as mechanical, patient was walking to her recliner, feeling somewhat out of breath, and bumped into the furniture causing her to fall over. CT this admission no evidence of rib fx   12/07: CTA PE showed a right sided central and segmental pulmonary emboli without heart strain and trace left pleural effusion. Started on heparin  infusion. Admitted to hosptialist  12/08: Pt lethargic AM following ativan  + phenergan . Cardiology consult - echo pending, consider proceed w/ R/L heart cath this hospitalization. GI consult for persistent nausea - recs continue acid suppression and antiemeditcs, cause likely acute illness +/- GERD/PUD, daughter requests EGD and Gi will consider pending cardiology recs / clearance for anesthesia.  12/09: Echo shows moderate RV df, mech thrombectomy risk > benefit per cardiology, consider hematology consult, continue heparin , possible cath later this week, also increased diltiazem  d/t asymptomatic SVT. Nausea resolved - GI s/o.  12/10: pulmonology consult - maintain 2L O2 wean or can go home w/ O2 if needed, ok for Eliquis  on discharge, DVT US  minimal DVT burden  bilaterally, started Breo for asthma/COPD overlap, encourage incentive spirometer use. Cardio - deferring cath for now. Pt/daughter request further discussion on this, I asked Courtney Wilcox team to discuss concerns w/ patient/family     Consultants:  Cardiology Gastroenterology Pulmonology   Procedures/Surgeries: none      ASSESSMENT & PLAN:   Acute pulmonary embolism, recurrent  History of prior PE Small CVT -Patient  dyspneic tachycardic and tachypneic in the setting of being off AC x 1 year to decrease bleeding risk with concomitant DAPT -CTA chest on admission showing central and segmental right-sided PE with trace left pleural effusion heparin  gtt Anticipate to Eliquis  on discharge, pend further procedures - clear w/ cardiology prior to transition to DOAC  Question need for IVC? 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 Current CT abdomen and pelvis unrevealing GI has s/o, no plan for EGD during this hospitalization, reengage as needed N/V improved, continue antacid and prn antiemetics .  Courtney Wilcox   Dyspnea --being worked up by outpatient cardio Courtney Wilcox and pulm Courtney Wilcox --suspect multifactorial, including asthma/COPD, PE (though small so not likely a major contributor), CAD though do not suspect actively ischemic, possibly pulmonary HTN, certainly debility and poor reserves w/ recent illnesses/injuries and obesity are also all putting her at a disadvantage and reducing capacity.   cardio to decide on inpatient heart cath - there has been some back-and-forth on this and I have asked Courtney Wilcox team to review  decision w/ patient per her request - there is some confusion about medical plan / frustration at lack of diagnosis. I spent some time today w/ patient and daughter at bedside reviewing  risk/benefit in general and if cardiologist feels catheterization is more risky than beneficial then they may not agree to her request for this procedure but I encouraged her to ask questions to ensure understanding and therapeutic cooperation.  Pulmonology consult, added Breo    Asthma/COPD overlap Follow w/ pulmonary  Breo ellipta   Bronchodilator nebs prn  Contusion of chest wall, left, subsequent encounter History of fall off couch hitting left chest and abdomen no rib fracture noted on CT  Norco PRN diclofenac  gel TID   CAD S/P percutaneous coronary angioplasty Has been on DAPT since stent 09/2022 --consult cardiology for decision on resumption of antiplatelet of choice Cardiology following    Rheumatoid arthritis  On chronic immunosuppressive therapy Will hold meds while in the house Arava  and Plaquenil  resume on discharge    Chronic kidney disease, stage 3a  Renal function at baseline Monitor BMP   HTN (hypertension) cont Cardizem    PSVT (paroxysmal supraventricular tachycardia) No acute issues suspected Continuous cardiac monitoring Cardiology increased Diltiazem     Chronic venous insufficiency No acute issues Monitor    Hyponatremia - resolved  Monitor BMP      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  Underweight - under 18  overweight - 25 to 29 obese - 30 or more Class 1 obesity: BMI of 30.0 to 34 Class 2 obesity: BMI of 35.0 to 39 Class 3 obesity: BMI of 40.0 to 49 Super Morbid Obesity: BMI 50-59 Super-super Morbid Obesity: BMI 60+ Healthy nutrition and physical activity advised as adjunct to other disease management and risk reduction treatments    DVT prophylaxis: on heparin  IV fluids: no continuous IV fluids  Nutrition: regular  Central lines / other devices: none  Code Status: FULL CODE ACP documentation reviewed:  none on file in  [facility]  TOC needs: pend PT/OT eval expect may need rehab  Medical barriers to dispo: pend cardiology clearance. Expected medical readiness for discharge 1-2 more days.              Subjective / Brief ROS:  Patient reports frustration at lack of clear diagnosis Denies CP/SOB at rest, notes significant SOB on exertion  Pain controlled.  Denies new weakness.  Tolerating diet.  Reports no concerns w/ urination/defecation.   Family Communication: daughter at [place] on rounds     Objective Findings:  Vitals:   12/26/23 0408 12/26/23 0813 12/26/23 1154 12/26/23 1732  BP: 136/63 135/65 (!) 156/77 115/64  Pulse: 82 83  (!) 101  Resp:  18 20 18   Temp: 98.2 F (36.8 C)  98 F (36.7 C) 97.7 F (36.5 C)  TempSrc: Oral  Oral   SpO2: 100% 100% 95% 96%  Weight:      Height:        Intake/Output Summary (Last 24 hours) at 12/26/2023 1748 Last data filed at 12/26/2023 0831 Gross per 24 hour  Intake 550.65 ml  Output 1440 ml  Net -889.35 ml   Filed Weights   12/23/23 1716  Weight: 90.7 kg    Examination:  Physical Exam Cardiovascular:     Rate and Rhythm: Normal rate and regular rhythm.  Pulmonary:     Breath sounds: Normal breath sounds.  Comments: Fair to poor inspiratory capacity  Chest:     Chest wall: Tenderness present.  Neurological:     General: No focal deficit present.     Mental Status: She is alert and oriented to person, place, and time.  Psychiatric:        Mood and Affect: Mood normal.        Behavior: Behavior normal.          Scheduled Medications:   aspirin   81 mg Oral Daily   [START ON 12/27/2023] aspirin   81 mg Oral Pre-Cath   diclofenac  Sodium  2 g Topical TID   diltiazem   240 mg Oral Daily   ezetimibe   10 mg Oral Daily   fluticasone  furoate-vilanterol  1 puff Inhalation Daily   pantoprazole   40 mg Oral QHS   rosuvastatin   5 mg Oral Daily   sertraline   50 mg Oral Daily    Continuous Infusions:  sodium chloride       Followed by   sodium chloride      heparin  1,100 Units/hr (12/26/23 1220)   promethazine  (PHENERGAN ) injection (IM or IVPB) 12.5 mg (12/24/23 0414)    PRN Medications:  acetaminophen  **OR** acetaminophen , famotidine , gabapentin , HYDROcodone -acetaminophen , ipratropium-albuterol , LORazepam , metoprolol  tartrate, ondansetron  **OR** ondansetron  (ZOFRAN ) IV, mouth rinse, promethazine  (PHENERGAN ) injection (IM or IVPB)  Antimicrobials from admission:  Anti-infectives (From admission, onward)    None           Data Reviewed:  I have personally reviewed the following...  CBC: Recent Labs  Lab 12/22/23 1606 12/23/23 1719 12/24/23 0520 12/25/23 0352 12/26/23 0528  WBC 8.3 7.9 6.7 6.9 6.7  HGB 13.0 12.9 11.1* 11.2* 11.1*  HCT 39.0 37.8 33.3* 33.7* 32.1*  MCV 100.3* 98.2 98.8 98.8 97.6  PLT 142* 143* 147* 176 213   Basic Metabolic Panel: Recent Labs  Lab 12/22/23 1606 12/23/23 1719 12/25/23 1454 12/26/23 0528  NA 132* 131* 126* 135  K 4.2 4.5 4.1 3.7  CL 95* 93* 94* 99  CO2 26 27 18* 24  GLUCOSE 117* 93 79 81  BUN 17 14 11 11   CREATININE 1.41* 1.20* 1.41* 1.37*  CALCIUM  8.7* 9.0 8.7* 8.6*  MG  --   --   --  1.9   GFR: Estimated Creatinine Clearance: 38.1 mL/min (A) (by C-G formula based on SCr of 1.37 mg/dL (H)). Liver Function Tests: Recent Labs  Lab 12/22/23 1606 12/23/23 1719  AST 25 28  ALT 24 25  ALKPHOS 88 91  BILITOT 0.7 0.7  PROT 5.8* 6.1*  ALBUMIN 3.6 3.8   No results for input(s): LIPASE, AMYLASE in the last 168 hours. No results for input(s): AMMONIA in the last 168 hours. Coagulation Profile: Recent Labs  Lab 12/23/23 1939  INR 1.0   Cardiac Enzymes: No results for input(s): CKTOTAL, CKMB, CKMBINDEX, TROPONINI in the last 168 hours. BNP (last 3 results) Recent Labs    12/23/23 1719  PROBNP 272.0   HbA1C: No results for input(s): HGBA1C in the last 72 hours. CBG: No results for input(s): GLUCAP in the last 168  hours. Lipid Profile: No results for input(s): CHOL, HDL, LDLCALC, TRIG, CHOLHDL, LDLDIRECT in the last 72 hours. Thyroid  Function Tests: No results for input(s): TSH, T4TOTAL, FREET4, T3FREE, THYROIDAB in the last 72 hours. Anemia Panel: No results for input(s): VITAMINB12, FOLATE, FERRITIN, TIBC, IRON , RETICCTPCT in the last 72 hours. Most Recent Urinalysis On File:     Component Value Date/Time   COLORURINE YELLOW (A) 06/13/2023 2358  APPEARANCEUR CLEAR (A) 06/13/2023 2358   LABSPEC 1.042 (H) 06/13/2023 2358   PHURINE 5.0 06/13/2023 2358   GLUCOSEU NEGATIVE 06/13/2023 2358   HGBUR NEGATIVE 06/13/2023 2358   BILIRUBINUR NEGATIVE 06/13/2023 2358   KETONESUR NEGATIVE 06/13/2023 2358   PROTEINUR NEGATIVE 06/13/2023 2358   NITRITE NEGATIVE 06/13/2023 2358   LEUKOCYTESUR TRACE (A) 06/13/2023 2358   Sepsis Labs: @LABRCNTIP (procalcitonin:4,lacticidven:4) Microbiology: No results found for this or any previous visit (from the past 240 hours).    Radiology Studies last 3 days: US  Venous Img Lower Bilateral (DVT) Result Date: 12/26/2023 CLINICAL DATA:  201733 DVT (deep venous thrombosis) (HCC) 201733, small acute pulmonary emboli 12/23/2023 EXAM: BILATERAL LOWER EXTREMITY VENOUS DOPPLER ULTRASOUND TECHNIQUE: Gray-scale sonography with graded compression, as well as color Doppler and duplex ultrasound were performed to evaluate the lower extremity deep venous systems from the level of the common femoral vein and including the common femoral, femoral, profunda femoral, popliteal and calf veins including the posterior tibial, peroneal and gastrocnemius veins when visible. Spectral Doppler was utilized to evaluate flow at rest and with distal augmentation maneuvers in the common femoral, femoral and popliteal veins. COMPARISON:  None Available. FINDINGS: RIGHT LOWER EXTREMITY Common Femoral Vein: No evidence of thrombus. Normal compressibility, respiratory  phasicity and response to augmentation. Saphenofemoral Junction: No evidence of thrombus. Normal compressibility and flow on color Doppler imaging. Profunda Femoral Vein: No evidence of thrombus. Normal compressibility and flow on color Doppler imaging. Femoral Vein: No evidence of thrombus. Normal compressibility, respiratory phasicity and response to augmentation. Popliteal Vein: No evidence of thrombus. Normal compressibility, respiratory phasicity and response to augmentation. Calf Veins: Posterior tibial and peroneal calf veins appear patent and compressible. Right gastrocnemius veins are noted to be distended with intraluminal thrombus and noncompressible compatible with DVT. Overall very minimal thrombus burden. LEFT LOWER EXTREMITY Common Femoral Vein: No evidence of thrombus. Normal compressibility, respiratory phasicity and response to augmentation. Saphenofemoral Junction: No evidence of thrombus. Normal compressibility and flow on color Doppler imaging. Profunda Femoral Vein: No evidence of thrombus. Normal compressibility and flow on color Doppler imaging. Femoral Vein: No evidence of thrombus. Normal compressibility, respiratory phasicity and response to augmentation. Popliteal Vein: No evidence of thrombus. Normal compressibility, respiratory phasicity and response to augmentation. Calf Veins: Left posterior tibial and peroneal veins are patent and compressible. left gastrocnemius veins are also noted to be noncompressible with some degree of hypoechoic intraluminal thrombus. Overall very minimal thrombus burden. IMPRESSION: Bilateral gastrocnemius calf DVT. Overall very low thrombus burden. Electronically Signed   By: CHRISTELLA.  Shick M.D.   On: 12/26/2023 13:00   ECHOCARDIOGRAM COMPLETE Result Date: 12/25/2023    ECHOCARDIOGRAM REPORT   Patient Name:   DENNISE RAABE Date of Exam: 12/25/2023 Medical Rec #:  [id number]         Height:       64.0 in Accession #:    [id number]        Weight:       200.0  lb Date of Birth:  March 31, 1945         BSA:          1.956 m Patient Age:    78 years          BP:           152/138 mmHg Patient Gender: F                 HR:           70 bpm. Exam Location:  [facility] Procedure: 2D Echo, Cardiac Doppler and Color Doppler (Both Spectral and Color            Flow Doppler were utilized during procedure). Indications:     Dyspnea R06.00                  Pulmonary embolus I26.09  History:         Patient has prior history of Echocardiogram examinations, most                  recent 04/26/2023. COPD; Risk Factors:Dyslipidemia and Sleep                  Apnea. Chronci kidney disease.  Sonographer:     Christopher Furnace Referring Phys:  [id number] SHERI HAMMOCK Diagnosing Phys: Lonni Hanson MD  Sonographer Comments: Technically challenging study due to limited acoustic windows. Image acquisition challenging due to COPD. IMPRESSIONS  1. Left ventricular ejection fraction, by estimation, is >55%. The left ventricle has normal function. Left ventricular endocardial border not optimally defined to evaluate regional wall motion. There is moderate left ventricular hypertrophy. Left ventricular diastolic parameters are consistent with Grade I diastolic dysfunction (impaired relaxation).  2. Right ventricular systolic function is moderately reduced. The right ventricular size is normal. Moderately increased right ventricular wall thickness.  3. The mitral valve was not well visualized. No evidence of mitral valve regurgitation. No evidence of mitral stenosis.  4. The aortic valve was not well visualized. Aortic valve regurgitation is not visualized. No aortic stenosis is present. FINDINGS  Left Ventricle: Left ventricular ejection fraction, by estimation, is >55%. The left ventricle has normal function. Left ventricular endocardial border not optimally defined to evaluate regional wall motion. The left ventricular internal cavity size was  normal in size. There is moderate left ventricular hypertrophy. Left  ventricular diastolic parameters are consistent with Grade I diastolic dysfunction (impaired relaxation). Right Ventricle: The right ventricular size is normal. Moderately increased right ventricular wall thickness. Right ventricular systolic function is moderately reduced. Left Atrium: Left atrial size was normal in size. Right Atrium: Right atrial size was normal in size. Pericardium: Trivial pericardial effusion is present. Presence of epicardial fat layer. Mitral Valve: The mitral valve was not well visualized. No evidence of mitral valve regurgitation. No evidence of mitral valve stenosis. MV peak gradient, 4.4 mmHg. The mean mitral valve gradient is 2.0 mmHg. Tricuspid Valve: The tricuspid valve is not well visualized. Tricuspid valve regurgitation is trivial. Aortic Valve: The aortic valve was not well visualized. Aortic valve regurgitation is not visualized. No aortic stenosis is present. Aortic valve mean gradient measures 2.0 mmHg. Aortic valve peak gradient measures 3.1 mmHg. Aortic valve area, by VTI measures 3.42 cm. Pulmonic Valve: The pulmonic valve was not well visualized. Pulmonic valve regurgitation is not visualized. No evidence of pulmonic stenosis. Aorta: The aortic root is normal in size and structure. Pulmonary Artery: The pulmonary artery is not well seen. IAS/Shunts: The interatrial septum was not well visualized.  LEFT VENTRICLE PLAX 2D LVIDd:         2.73 cm   Diastology LVIDs:         1.12 cm   LV e' medial:    3.92 cm/s LV PW:         1.59 cm   LV E/e' medial:  13.4 LV IVS:        1.56 cm   LV e' lateral:   8.38 cm/s LVOT diam:     2.20 cm  LV E/e' lateral: 6.3 LV SV:         54 LV SV Index:   28 LVOT Area:     3.80 cm  RIGHT VENTRICLE RV Basal diam:  2.70 cm RV Mid diam:    2.60 cm RV S prime:     8.38 cm/s TAPSE (M-mode): 1.4 cm LEFT ATRIUM             Index        RIGHT ATRIUM           Index LA diam:        4.50 cm 2.30 cm/m   RA Area:     11.70 cm LA Vol (A2C):   25.6 ml 13.06  ml/m  RA Volume:   23.50 ml  12.01 ml/m LA Vol (A4C):   24.0 ml 12.27 ml/m LA Biplane Vol: 27.7 ml 14.16 ml/m  AORTIC VALVE AV Area (Vmax):    3.32 cm AV Area (Vmean):   3.33 cm AV Area (VTI):     3.42 cm AV Vmax:           87.50 cm/s AV Vmean:          59.800 cm/s AV VTI:            0.159 m AV Peak Grad:      3.1 mmHg AV Mean Grad:      2.0 mmHg LVOT Vmax:         76.40 cm/s LVOT Vmean:        52.400 cm/s LVOT VTI:          0.143 m LVOT/AV VTI ratio: 0.90  AORTA Ao Root diam: 3.10 cm MITRAL VALVE               TRICUSPID VALVE MV Area (PHT): 2.39 cm    TR Peak grad:   12.2 mmHg MV Area VTI:   2.92 cm    TR Vmax:        175.00 cm/s MV Peak grad:  4.4 mmHg MV Mean grad:  2.0 mmHg    SHUNTS MV Vmax:       1.05 m/s    Systemic VTI:  0.14 m MV Vmean:      63.2 cm/s   Systemic Diam: 2.20 cm MV Decel Time: 317 msec MV E velocity: 52.70 cm/s MV A velocity: 66.40 cm/s MV E/A ratio:  0.79 Lonni End MD Electronically signed by Lonni Hanson MD Signature Date/Time: 12/25/2023/12:39:30 PM    Final    CT Angio Chest PE W and/or Wo Contrast Result Date: 12/23/2023 CLINICAL DATA:  Clemens yesterday, nausea and vomiting, weakness, increasing shortness of breath EXAM: CT ANGIOGRAPHY CHEST CT ABDOMEN AND PELVIS WITH CONTRAST TECHNIQUE: Multidetector CT imaging of the chest was performed using the standard protocol during bolus administration of intravenous contrast. Multiplanar CT image reconstructions and MIPs were obtained to evaluate the vascular anatomy. Multidetector CT imaging of the abdomen and pelvis was performed using the standard protocol during bolus administration of intravenous contrast. RADIATION DOSE REDUCTION: This exam was performed according to the departmental dose-optimization program which includes automated exposure control, adjustment of the mA and/or kV according to patient size and/or use of iterative reconstruction technique. CONTRAST:  OMNIPAQUE  IOHEXOL  350 MG/ML SOLN COMPARISON:   12/23/2023, 11/21/2023, 09/04/2023 FINDINGS: CTA CHEST FINDINGS Cardiovascular: This is a technically adequate evaluation of the pulmonary vasculature. Right-sided central and lobar pulmonary emboli are identified. No evidence of right heart strain. Lipomatous hypertrophy of the interatrial  septum is again noted. No pericardial effusion. Normal caliber of the thoracic aorta. Stable atherosclerosis of the aorta and coronary vasculature. Mediastinum/Nodes: Stable appearance of the thyroid . Trachea and esophagus are unremarkable. No pathologic adenopathy. Lungs/Pleura: Trace left pleural effusion. No acute airspace disease or pneumothorax. Dependent hypoventilatory changes are seen within the lower lobes. Musculoskeletal: No acute or destructive bony abnormalities. Reconstructed images demonstrate no additional findings. Review of the MIP images confirms the above findings. CT ABDOMEN and PELVIS FINDINGS Hepatobiliary: No focal liver abnormality is seen. No gallstones, gallbladder wall thickening, or biliary dilatation. Pancreas: Unremarkable. No pancreatic ductal dilatation or surrounding inflammatory changes. Spleen: Normal in size without focal abnormality. Adrenals/Urinary Tract: Bilateral renal cortical atrophy again noted. No abnormal renal enhancement. No urinary tract calculi or obstructive uropathy. The adrenals are stable. Bladder is decompressed, limiting its evaluation. Stomach/Bowel: No bowel obstruction or ileus. Normal appendix right lower quadrant. Sigmoid diverticulosis without evidence of acute diverticulitis. No bowel wall thickening or inflammatory change. Vascular/Lymphatic: No significant vascular findings are present. No enlarged abdominal or pelvic lymph nodes. Reproductive: Uterus and bilateral adnexa are unremarkable. Other: No free fluid or free intraperitoneal gas. No abdominal wall hernia. Musculoskeletal: Unremarkable right hip arthroplasty. There are no acute or destructive bony  abnormalities. Reconstructed images demonstrate no additional findings. Review of the MIP images confirms the above findings. IMPRESSION: Chest: 1. Right-sided central and segmental pulmonary emboli. No right heart strain. 2. Trace left pleural effusion. 3. Aortic Atherosclerosis (ICD10-I70.0). Coronary artery atherosclerosis. Abdomen/pelvis: 1. No acute intra-abdominal or intrapelvic process. 2. Sigmoid diverticulosis without diverticulitis. Critical Value/emergent results were called by telephone at the time of interpretation on 12/23/2023 at 7:09 pm to provider PHILLIP STAFFORD , who verbally acknowledged these results. Electronically Signed   By: Ozell Daring M.D.   On: 12/23/2023 19:12   CT ABDOMEN PELVIS W CONTRAST Result Date: 12/23/2023 CLINICAL DATA:  Clemens yesterday, nausea and vomiting, weakness, increasing shortness of breath EXAM: CT ANGIOGRAPHY CHEST CT ABDOMEN AND PELVIS WITH CONTRAST TECHNIQUE: Multidetector CT imaging of the chest was performed using the standard protocol during bolus administration of intravenous contrast. Multiplanar CT image reconstructions and MIPs were obtained to evaluate the vascular anatomy. Multidetector CT imaging of the abdomen and pelvis was performed using the standard protocol during bolus administration of intravenous contrast. RADIATION DOSE REDUCTION: This exam was performed according to the departmental dose-optimization program which includes automated exposure control, adjustment of the mA and/or kV according to patient size and/or use of iterative reconstruction technique. CONTRAST:  OMNIPAQUE  IOHEXOL  350 MG/ML SOLN COMPARISON:  12/23/2023, 11/21/2023, 09/04/2023 FINDINGS: CTA CHEST FINDINGS Cardiovascular: This is a technically adequate evaluation of the pulmonary vasculature. Right-sided central and lobar pulmonary emboli are identified. No evidence of right heart strain. Lipomatous hypertrophy of the interatrial septum is again noted. No pericardial  effusion. Normal caliber of the thoracic aorta. Stable atherosclerosis of the aorta and coronary vasculature. Mediastinum/Nodes: Stable appearance of the thyroid . Trachea and esophagus are unremarkable. No pathologic adenopathy. Lungs/Pleura: Trace left pleural effusion. No acute airspace disease or pneumothorax. Dependent hypoventilatory changes are seen within the lower lobes. Musculoskeletal: No acute or destructive bony abnormalities. Reconstructed images demonstrate no additional findings. Review of the MIP images confirms the above findings. CT ABDOMEN and PELVIS FINDINGS Hepatobiliary: No focal liver abnormality is seen. No gallstones, gallbladder wall thickening, or biliary dilatation. Pancreas: Unremarkable. No pancreatic ductal dilatation or surrounding inflammatory changes. Spleen: Normal in size without focal abnormality. Adrenals/Urinary Tract: Bilateral renal cortical atrophy again noted. No abnormal  renal enhancement. No urinary tract calculi or obstructive uropathy. The adrenals are stable. Bladder is decompressed, limiting its evaluation. Stomach/Bowel: No bowel obstruction or ileus. Normal appendix right lower quadrant. Sigmoid diverticulosis without evidence of acute diverticulitis. No bowel wall thickening or inflammatory change. Vascular/Lymphatic: No significant vascular findings are present. No enlarged abdominal or pelvic lymph nodes. Reproductive: Uterus and bilateral adnexa are unremarkable. Other: No free fluid or free intraperitoneal gas. No abdominal wall hernia. Musculoskeletal: Unremarkable right hip arthroplasty. There are no acute or destructive bony abnormalities. Reconstructed images demonstrate no additional findings. Review of the MIP images confirms the above findings. IMPRESSION: Chest: 1. Right-sided central and segmental pulmonary emboli. No right heart strain. 2. Trace left pleural effusion. 3. Aortic Atherosclerosis (ICD10-I70.0). Coronary artery atherosclerosis.  Abdomen/pelvis: 1. No acute intra-abdominal or intrapelvic process. 2. Sigmoid diverticulosis without diverticulitis. Critical Value/emergent results were called by telephone at the time of interpretation on 12/23/2023 at 7:09 pm to provider PHILLIP STAFFORD , who verbally acknowledged these results. Electronically Signed   By: Ozell Daring M.D.   On: 12/23/2023 19:12   CT Head Wo Contrast Result Date: 12/23/2023 EXAM: CT HEAD WITHOUT CONTRAST 12/23/2023 06:48:03 PM TECHNIQUE: CT of the head was performed without the administration of intravenous contrast. Automated exposure control, iterative reconstruction, and/or weight based adjustment of the mA/kV was utilized to reduce the radiation dose to as low as reasonably achievable. COMPARISON: None available. CLINICAL HISTORY: Head trauma, minor (Age >= 65y) FINDINGS: BRAIN AND VENTRICLES: No acute hemorrhage. No evidence of acute infarct. No hydrocephalus. No extra-axial collection. No mass effect or midline shift. 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 Patchy white matter hypodensities are compatible with chronic microvascular ischemic change. Remote right thalamic lacunar infarct. ORBITS: No acute abnormality. SINUSES: No acute abnormality. SOFT TISSUES AND SKULL: No acute soft tissue abnormality. No skull fracture. IMPRESSION: 1. No acute intracranial abnormality. 2. 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 Electronically signed by: Gilmore Molt MD 12/23/2023 06:58 PM EST RP Workstation: [id number]   DG Chest 2 View Result Date: 12/23/2023 EXAM: 2 VIEW(S) XRAY OF THE CHEST 12/23/2023 05:52:00 PM COMPARISON: Yesterday. CLINICAL HISTORY: SOB. FINDINGS: LUNGS AND PLEURA: No focal pulmonary opacity. No pleural effusion. No pneumothorax. HEART AND MEDIASTINUM: Stable cardiomediastinal silhouette. BONES AND SOFT TISSUES: No  acute osseous abnormality. IMPRESSION: 1. No acute cardiopulmonary process. Electronically signed by: Lynwood Seip MD 12/23/2023 06:07 PM EST RP Workstation: [id number]       Time spent: 50 min     Laneta Blunt, DO [facility] 12/26/2023, 5:48 PM    Dictation software may have been used to generate the above note. Typos may occur and escape review in typed/dictated notes. Please contact Courtney Blunt directly for clarity if needed.  Staff may message me via secure chat in Epic  but this may not receive an immediate response,  please page me for urgent matters!  If 7PM-7AM, please contact night coverage [email or web address]

## 2023-12-26 NOTE — Telephone Encounter (Signed)
 Daughter is calling back to speak to a nurse about the cath

## 2023-12-26 NOTE — Care Management Important Message (Signed)
 Important Message  Patient Details  Name: Courtney Wilcox MRN: [id number] Date of Birth: 10/17/1945   Important Message Given:  Yes - Medicare IM     Rojelio SHAUNNA Rattler 12/26/2023, 4:27 PM

## 2023-12-26 NOTE — Consult Note (Signed)
 PHARMACY - ANTICOAGULATION CONSULT NOTE  Pharmacy Consult for Heparin   Indication: pulmonary embolus  Patient Measurements: Height: 5' 4 (162.6 cm) Weight: 90.7 kg (199 lb 15.3 oz) IBW/kg (Calculated) : 54.7 HEPARIN  DW (KG): 75.1  Labs: Recent Labs    12/23/23 1719 12/23/23 1939 12/24/23 0520 12/24/23 1416 12/25/23 0352 12/25/23 1454 12/26/23 0019 12/26/23 0528  HGB 12.9  --  11.1*  --  11.2*  --   --  11.1*  HCT 37.8  --  33.3*  --  33.7*  --   --  32.1*  PLT 143*  --  147*  --  176  --   --  213  APTT  --  27 65*  --   --   --   --   --   LABPROT  --  13.3  --   --   --   --   --   --   INR  --  1.0  --   --   --   --   --   --   HEPARINUNFRC  --   --  0.46   < > 0.72* 0.66 0.55 0.53  CREATININE 1.20*  --   --   --   --  1.41*  --   --    < > = values in this interval not displayed.    Estimated Creatinine Clearance: 37 mL/min (A) (by C-G formula based on SCr of 1.41 mg/dL (H)).   Medical History: Past Medical History:  Diagnosis Date   Allergic rhinitis    Anxiety    Arthritis    Asthma    Atrophic vaginitis    B12 deficiency    Bronchitis, chronic (HCC)    CAD (coronary artery disease)    a. 03/2003 Cath: Nl cors; b. 09/2022 Staged PCI: LM nl, LAD 85p/17m (3.5x26 Onyx Frontier DES), RI small, mild dzs, LCX nl, OM2 mild dzs, RCA 30p.   Cervical disc disease    CKD (chronic kidney disease), stage III (HCC)    Collagen vascular disease    RA   COPD (chronic obstructive pulmonary disease) (HCC)    Cough - post-COVID    Depression    Diastolic dysfunction    a. 11/2020 Echo: EF 45-50%, GrI DD; b. 10/2021 Echo: EF 60-65%, GrII DD; c. 09/2022 Echo: EF 55-60%, no rwma, GrI DD, nl RV fxn, mildly dil LA. No significant valvular dzs; d. 04/2023 Echo: EF 60-65% no rwma, mild LVH, nl RV fxn, RVSP 23.13mmHg. AoV sclerosis.   Fibrocystic breast disease    Foot ulcer (HCC)    GERD (gastroesophageal reflux disease)    Hammer toe    Hyperlipidemia    IBS (irritable bowel  syndrome)    Macrocytic anemia    Macular degeneration 2023   Nausea and vomiting 12/23/2023   Obesity    OSA (obstructive sleep apnea)    Osteoarthrosis    Multi site   Osteopenia    PSVT (paroxysmal supraventricular tachycardia)    a. 04/2022 Zio: Sinus rhythm @ 57 (42-101). Rare PACs/PVCs. 74290 SVT runs lasting up to 52m 12sec - max rate 222. 5 beats NSVT. Triggered events = sinus, SVT, PACs.   Rheumatoid arthritis (HCC)    Shingles    Thyroid  nodule    MULTIPLE   Tonsillitis    recurrent   Umbilical hernia    Assessment: Pt presents to the ED after a fall and increasing SOB last few weeks. Hx of aflutter, SVT, total  replacement of hip, CAD, and CKD. Head CT, no note of bleed. Chest CT: Right-sided central and segmental pulmonary emboli. No right heart strain. CBC stable. No note of DOAC PTA. Pharmacy consulted to start heparin  infusion for PE.   Goal of Therapy:  Heparin  level 0.3-0.7 units/ml Monitor platelets by anticoagulation protocol: Yes   12/9 1454 HL 0.66, therapeutic x 1 12/10 0019 HL 0.55, therapeutic x 2 12/10 0528 HL 0.53, therapeutic x 3  Plan:  HL therapeutic x 3 Continue heparin  infusion at 1100 units/hr Recheck HL daily w/ AM labs while therapeutic Daily CBC while on heparin   Rankin CANDIE Dills, PharmD, [facility] 12/26/2023 6:22 AM

## 2023-12-26 NOTE — Consult Note (Signed)
 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  Previously had a pulmonary embolism and had Eliquis  but was taken off due to being on dual antiplatelet therapy and easy bruising.  She reports that she fell down approximately 4 days prior to admission and has been feeling chest discomfort with worsening dyspnea since then.  She had a CT angio PE protocol 1 month ago which was negative for PE.  On arrival to the ER she was tachypneic and tachycardic with cardiac biomarkers were normal and patient was not hypoxemic on ambient air.  She had a repeat CTA PE protocol showing multiple subsegmental emboli without right heart strain and a left pleural effusion which is small.  The emboli are mostly right-sided central and lobar pulmonary emboli.  Patient has been placed on heparin  drip and is doing well without any signs of bleeding.  PCCM consultation placed for additional evaluation management   PAST MEDICAL HISTORY   Past Medical History:  Diagnosis Date   Allergic rhinitis    Anxiety    Arthritis    Asthma    Atrophic vaginitis    B12 deficiency    Bronchitis, chronic (HCC)    CAD (coronary artery disease)    a. 03/2003 Cath: Nl cors; b. 09/2022 Staged PCI: LM nl, LAD 85p/7m (3.5x26 Onyx  Frontier DES), RI small, mild dzs, LCX nl, OM2 mild dzs, RCA 30p.   Cervical disc disease    CKD (chronic kidney disease), stage III (HCC)    Collagen vascular disease    RA   COPD (chronic obstructive pulmonary disease) (HCC)    Cough - post-COVID    Depression    Diastolic dysfunction    a. 11/2020 Echo: EF 45-50%, GrI DD; b. 10/2021 Echo: EF 60-65%, GrII DD; c. 09/2022 Echo: EF 55-60%, no rwma, GrI DD, nl RV fxn, mildly dil LA. No significant valvular dzs; d. 04/2023 Echo: EF 60-65% no rwma, mild LVH, nl RV fxn, RVSP 23.75mmHg. AoV sclerosis.   Fibrocystic breast disease    Foot ulcer (HCC)    GERD (gastroesophageal reflux disease)    Hammer toe    Hyperlipidemia    IBS (irritable bowel syndrome)    Macrocytic anemia    Macular degeneration 2023   Nausea and vomiting 12/23/2023   Obesity    OSA (obstructive sleep apnea)    Osteoarthrosis    Multi site   Osteopenia    PSVT (paroxysmal supraventricular tachycardia)    a. 04/2022 Zio: Sinus rhythm @ 57 (42-101). Rare PACs/PVCs. 74290 SVT runs lasting up to 60m 12sec - max rate 222. 5 beats NSVT. Triggered events = sinus, SVT, PACs.   Rheumatoid arthritis (HCC)    Shingles    Thyroid  nodule    MULTIPLE  Tonsillitis    recurrent   Umbilical hernia      SURGICAL HISTORY   Past Surgical History:  Procedure Laterality Date   BACK SURGERY  2011   LAMINECTOMY   BREAST BIOPSY Right    neg   CERVICAL FUSION  2011, 2012   X 2   COLONOSCOPY  2005, 2015   COLONOSCOPY WITH PROPOFOL  N/A 08/04/2020   Procedure: COLONOSCOPY WITH PROPOFOL ;  Surgeon: Dessa Reyes ORN, MD;  Location: [facility] ENDOSCOPY;  Service: Endoscopy;  Laterality: N/A;   CORONARY STENT INTERVENTION N/A 10/02/2022   Procedure: CORONARY STENT INTERVENTION;  Surgeon: Darron Deatrice LABOR, MD;  Location: [facility]IVE [facility];  Service: Cardiovascular;  Laterality: N/A;   ESOPHAGOGASTRODUODENOSCOPY (EGD) WITH PROPOFOL  N/A 09/26/2021   Procedure: ESOPHAGOGASTRODUODENOSCOPY (EGD)  WITH PROPOFOL ;  Surgeon: Onita Elspeth Sharper, DO;  Location: [facility] ENDOSCOPY;  Service: Gastroenterology;  Laterality: N/A;   ESOPHAGOGASTRODUODENOSCOPY ENDOSCOPY  2005, 2013, 2015   EYE SURGERY Bilateral    Cataract Extraction with IOL    HERNIA REPAIR     JOINT REPLACEMENT     KNEE ARTHROPLASTY Left 07/12/2016   Procedure: COMPUTER ASSISTED TOTAL KNEE ARTHROPLASTY;  Surgeon: Mardee Lynwood SQUIBB, MD;  Location: [facility] ORS;  Service: Orthopedics;  Laterality: Left;   Laryngeal tear after intubation w/repair     LUMBAR DISC SURGERY  12/2009   REPLACEMENT TOTAL KNEE Right 06/2007   DR. HOOTEN, [facility]   REPLACEMENT UNICONDYLAR JOINT KNEE     DR. HOOTEN, [facility]   RIGHT/LEFT HEART CATH AND CORONARY ANGIOGRAPHY N/A 09/29/2022   Procedure: RIGHT/LEFT HEART CATH AND CORONARY ANGIOGRAPHY;  Surgeon: Darron Deatrice LABOR, MD;  Location: [facility];  Service: Cardiovascular;  Laterality: N/A;   SHOULDER ARTHROSCOPY WITH ROTATOR CUFF REPAIR Right    TONSILLECTOMY     TOTAL HIP ARTHROPLASTY Right 01/31/2017   Procedure: TOTAL HIP ARTHROPLASTY;  Surgeon: Mardee Lynwood SQUIBB, MD;  Location: [facility] OR[facility];  Service: Orthopedics;  Laterality: Right;   TUBAL LIGATION     UPPER GI ENDOSCOPY     VENTRAL HERNIA REPAIR N/A 09/04/2023   Procedure: REPAIR, HERNIA, VENTRAL;  Surgeon: Marinda Jayson KIDD, MD;  Location: [facility] ORS;  Service: General;  Laterality: N/A;     FAMILY HISTORY   Family History  Problem Relation Age of Onset   Asthma Mother    Breast cancer Mother 44   Emphysema Father        smoked   Lung cancer Father        smoked   Coronary artery disease Father 36     SOCIAL HISTORY   Social History   Tobacco Use   Smoking status: Never   Smokeless tobacco: Never  Vaping Use   Vaping status: Never Used  Substance Use Topics   Alcohol use: No   Drug use: No     MEDICATIONS    Home Medication:    Current Medication:  Current Facility-Administered Medications:    acetaminophen  (TYLENOL )  tablet 650 mg, 650 mg, Oral, Q6H PRN, 650 mg at 12/25/23 0919 **OR** acetaminophen  (TYLENOL ) suppository 650 mg, 650 mg, Rectal, Q6H PRN, Duncan, Hazel V, MD   aspirin  chewable tablet 81 mg, 81 mg, Oral, Daily, End, Lonni, MD   diclofenac  Sodium (VOLTAREN ) 1 % topical gel 2 g, 2 g, Topical, TID, Awanda [place], MD   diltiazem  (CARDIZEM  CD) 24 hr capsule 240 mg, 240 mg, Oral, Daily, Hammock, Sheri, NP   famotidine  (PEPCID ) tablet 20 mg, 20 mg, Oral, Daily PRN, Awanda,  Ellouise, MD   fluticasone  furoate-vilanterol (BREO ELLIPTA ) 100-25 MCG/ACT 1 puff, 1 puff, Inhalation, Daily, Cleatus Delayne GAILS, MD, 1 puff at 12/25/23 0920   gabapentin  (NEURONTIN ) capsule 300 mg, 300 mg, Oral, QHS PRN, Cleatus Delayne GAILS, MD, 300 mg at 12/25/23 2138   heparin  ADULT infusion 100 units/mL (25000 units/250mL), 1,100 Units/hr, Intravenous, Continuous, Dail Rankin RAMAN, RPH, Last Rate: 11 mL/hr at 12/25/23 1326, 1,100 Units/hr at 12/25/23 1326   HYDROcodone -acetaminophen  (NORCO/VICODIN) 5-325 MG per tablet 1-2 tablet, 1-2 tablet, Oral, Q4H PRN, Cleatus Delayne GAILS, MD, 1 tablet at 12/25/23 2145   ipratropium-albuterol  (DUONEB) 0.5-2.5 (3) MG/3ML nebulizer solution 3 mL, 3 mL, Nebulization, Q4H PRN, Cleatus Delayne GAILS, MD, 3 mL at 12/24/23 9176   LORazepam  (ATIVAN ) injection 1 mg, 1 mg, Intravenous, Q6H PRN, Awanda Ellouise, MD, 1 mg at 12/24/23 1327   metoprolol  tartrate (LOPRESSOR ) injection 5 mg, 5 mg, Intravenous, Q6H PRN, Cleatus Delayne GAILS, MD   ondansetron  (ZOFRAN ) tablet 4 mg, 4 mg, Oral, Q6H PRN **OR** ondansetron  (ZOFRAN ) injection 4 mg, 4 mg, Intravenous, Q6H PRN, Cleatus Delayne GAILS, MD, 4 mg at 12/24/23 2211   Oral care mouth rinse, 15 mL, Mouth Rinse, PRN, Cleatus Delayne GAILS, MD   pantoprazole  (PROTONIX ) EC tablet 40 mg, 40 mg, Oral, QHS, Clair Marolyn NOVAK, RPH, 40 mg at 12/25/23 2138   promethazine  (PHENERGAN ) 12.5 mg in sodium chloride  0.9 % 50 mL IVPB, 12.5 mg, Intravenous, Q6H PRN, Cleatus Delayne GAILS, MD, Last Rate: 150 mL/hr at 12/24/23 0414,  12.5 mg at 12/24/23 0414   rosuvastatin  (CRESTOR ) tablet 5 mg, 5 mg, Oral, Daily, End, Lonni, MD   sertraline  (ZOLOFT ) tablet 50 mg, 50 mg, Oral, Daily, Cleatus Delayne GAILS, MD, 50 mg at 12/25/23 0919    ALLERGIES   Amiodarone , Lactose, Other, Biaxin [clarithromycin], Hydrocodone -acetaminophen , Oxycodone , Sulfa antibiotics, and Sulfasalazine     REVIEW OF SYSTEMS    Review of Systems:  Gen:  Denies  fever, sweats, chills weigh loss  HEENT: Denies blurred vision, double vision, ear pain, eye pain, hearing loss, nose bleeds, sore throat Cardiac:  No dizziness, chest pain or heaviness, chest tightness,edema Resp:   reports dyspnea chronically  Gi: Denies swallowing difficulty, stomach pain, nausea or vomiting, diarrhea, constipation, bowel incontinence Gu:  Denies bladder incontinence, burning urine Ext:   Denies Joint pain, stiffness or swelling Skin: Denies  skin rash, easy bruising or bleeding or hives Endoc:  Denies polyuria, polydipsia , polyphagia or weight change Psych:   Denies depression, insomnia or hallucinations   Other:  All other systems negative   VS: BP 136/63 (BP Location: Right Arm)   Pulse 82   Temp 98.2 F (36.8 C) (Oral)   Resp 20   Ht 5' 4 (1.626 m)   Wt 90.7 kg   SpO2 100%   BMI 34.32 kg/m      PHYSICAL EXAM    GENERAL:NAD, no fevers, chills, no weakness no fatigue HEAD: Normocephalic, atraumatic.  EYES: Pupils equal, round, reactive to light. Extraocular muscles intact. No scleral icterus.  MOUTH: Moist mucosal membrane. Dentition intact. No abscess noted.  EAR, NOSE, THROAT: Clear without exudates. No external lesions.  NECK: Supple. No thyromegaly. No nodules. No JVD.  PULMONARY: decreased breath sounds with mild rhonchi worse at bases bilaterally.  CARDIOVASCULAR: S1 and S2. Regular rate and rhythm. No murmurs, rubs, or gallops. No edema. Pedal pulses 2+ bilaterally.  GASTROINTESTINAL: Soft, nontender, nondistended. No masses.  Positive bowel sounds. No hepatosplenomegaly.  MUSCULOSKELETAL: No swelling, clubbing, or  edema. Range of motion full in all extremities.  NEUROLOGIC: Cranial nerves II through XII are intact. No gross focal neurological deficits. Sensation intact. Reflexes intact.  SKIN: No ulceration, lesions, rashes, or cyanosis. Skin warm and dry. Turgor intact.  PSYCHIATRIC: Mood, affect within normal limits. The patient is awake, alert and oriented x 3. Insight, judgment intact.       IMAGING  Study Result  Narrative & Impression  CLINICAL DATA:  Clemens yesterday, nausea and vomiting, weakness, increasing shortness of breath   EXAM: CT ANGIOGRAPHY CHEST   CT ABDOMEN AND PELVIS WITH CONTRAST   TECHNIQUE: Multidetector CT imaging of the chest was performed using the standard protocol during bolus administration of intravenous contrast. Multiplanar CT image reconstructions and MIPs were obtained to evaluate the vascular anatomy. Multidetector CT imaging of the abdomen and pelvis was performed using the standard protocol during bolus administration of intravenous contrast.   RADIATION DOSE REDUCTION: This exam was performed according to the departmental dose-optimization program which includes automated exposure control, adjustment of the mA and/or kV according to patient size and/or use of iterative reconstruction technique.   CONTRAST:  OMNIPAQUE  IOHEXOL  350 MG/ML SOLN   COMPARISON:  12/23/2023, 11/21/2023, 09/04/2023   FINDINGS: CTA CHEST FINDINGS   Cardiovascular: This is a technically adequate evaluation of the pulmonary vasculature. Right-sided central and lobar pulmonary emboli are identified. No evidence of right heart strain.   Lipomatous hypertrophy of the interatrial septum is again noted. No pericardial effusion. Normal caliber of the thoracic aorta. Stable atherosclerosis of the aorta and coronary vasculature.   Mediastinum/Nodes: Stable appearance of the thyroid .  Trachea and esophagus are unremarkable. No pathologic adenopathy.   Lungs/Pleura: Trace left pleural effusion. No acute airspace disease or pneumothorax. Dependent hypoventilatory changes are seen within the lower lobes.   Musculoskeletal: No acute or destructive bony abnormalities. Reconstructed images demonstrate no additional findings.   Review of the MIP images confirms the above findings.   CT ABDOMEN and PELVIS FINDINGS   Hepatobiliary: No focal liver abnormality is seen. No gallstones, gallbladder wall thickening, or biliary dilatation.   Pancreas: Unremarkable. No pancreatic ductal dilatation or surrounding inflammatory changes.   Spleen: Normal in size without focal abnormality.   Adrenals/Urinary Tract: Bilateral renal cortical atrophy again noted. No abnormal renal enhancement. No urinary tract calculi or obstructive uropathy. The adrenals are stable. Bladder is decompressed, limiting its evaluation.   Stomach/Bowel: No bowel obstruction or ileus. Normal appendix right lower quadrant. Sigmoid diverticulosis without evidence of acute diverticulitis. No bowel wall thickening or inflammatory change.   Vascular/Lymphatic: No significant vascular findings are present. No enlarged abdominal or pelvic lymph nodes.   Reproductive: Uterus and bilateral adnexa are unremarkable.   Other: No free fluid or free intraperitoneal gas. No abdominal wall hernia.   Musculoskeletal: Unremarkable right hip arthroplasty. There are no acute or destructive bony abnormalities. Reconstructed images demonstrate no additional findings.   Review of the MIP images confirms the above findings.   IMPRESSION: Chest:   1. Right-sided central and segmental pulmonary emboli. No right heart strain. 2. Trace left pleural effusion. 3. Aortic Atherosclerosis (ICD10-I70.0). Coronary artery atherosclerosis.   Abdomen/pelvis:   1. No acute intra-abdominal or intrapelvic process. 2. Sigmoid  diverticulosis without diverticulitis.   Critical Value/emergent results were called by telephone at the time of interpretation on 12/23/2023 at 7:09 pm to provider PHILLIP STAFFORD , who verbally acknowledged these results.     Electronically Signed   By: Ozell Daring M.D.   On:  12/23/2023 19:12    ASSESSMENT/PLAN   Acute nonmassive pulmonary embolism    - Patient currently is in no distress and in general is with chronic dyspnea and chronic physical deconditioning with morbid obesity.   -She is currently on 2 L/min nasal cannula I think this could be weaned down but she may be able to go home on oxygen as well.  -Patient may be transition to p.o. Eliquis  and sent home with outpatient follow-up  - Recommend PT OT if DVT US  is clear.  If not she may need an IVC filter - No indication for pulmonary thrombectomy at this time  Asthma COPD overlap - Patient is currently on DuoNebs as needed Breo Ellipta  100/25 once daily as ordered   Rheumatoid arthritis Patient takes Arava  and Plaquenil    Bibasilar atelectasis with obesity hypoventilation -Patient would benefit from using incentive spirometry - I have discussed incentive spirometry and chest physiotherapy as well as encouraged patient to use this device multiple times each hour while inpatient   Thank you for allowing me to participate in the care of this patient.   Patient/Family are satisfied with care plan and all questions have been answered.    Provider disclosure: Patient with at least one acute or chronic illness or injury that poses a threat to life or bodily function and is being managed actively during this encounter.  All of the below services have been performed independently by signing provider:  review of prior documentation from internal and or external health records.  Review of previous and current lab results.  Interview and comprehensive assessment during patient visit today. Review of current and previous  chest radiographs/CT scans. Discussion of management and test interpretation with health care team and patient/family.   This document was prepared using Dragon voice recognition software and may include unintentional dictation errors.     Tag Wurtz, M.D.  Division of Pulmonary & Critical Care Medicine

## 2023-12-26 NOTE — Progress Notes (Signed)
 Rounding Note   Patient Name: Courtney Wilcox Date of Encounter: 12/26/2023  [facility] Cardiologist: Lonni Hanson, MD   Subjective Patient seen on rounds. Denies any chest pain or shortness of breath today.  Sitting upright in the recliner.  Continued on heparin  infusion. No family at the bedside this morning.  Scheduled Meds:  aspirin   81 mg Oral Daily   diclofenac  Sodium  2 g Topical TID   diltiazem   240 mg Oral Daily   ezetimibe   10 mg Oral Daily   fluticasone  furoate-vilanterol  1 puff Inhalation Daily   pantoprazole   40 mg Oral QHS   rosuvastatin   5 mg Oral Daily   sertraline   50 mg Oral Daily   Continuous Infusions:  heparin  1,100 Units/hr (12/25/23 1326)   promethazine  (PHENERGAN ) injection (IM or IVPB) 12.5 mg (12/24/23 0414)   PRN Meds: acetaminophen  **OR** acetaminophen , famotidine , gabapentin , HYDROcodone -acetaminophen , ipratropium-albuterol , LORazepam , metoprolol  tartrate, ondansetron  **OR** ondansetron  (ZOFRAN ) IV, mouth rinse, promethazine  (PHENERGAN ) injection (IM or IVPB)   Vital Signs  Vitals:   12/25/23 1923 12/26/23 0041 12/26/23 0408 12/26/23 0813  BP: 109/72 123/75 136/63 135/65  Pulse: (!) 112 (!) 116 82 83  Resp: 20 20  18   Temp: 97.9 F (36.6 C) 98.4 F (36.9 C) 98.2 F (36.8 C)   TempSrc:   Oral   SpO2: 100% 100% 100% 100%  Weight:      Height:        Intake/Output Summary (Last 24 hours) at 12/26/2023 0907 Last data filed at 12/26/2023 9376 Gross per 24 hour  Intake 550.65 ml  Output 1400 ml  Net -849.35 ml      12/23/2023    5:16 PM 12/22/2023    3:52 PM 11/30/2023    3:19 PM  Last 3 Weights  Weight (lbs) 199 lb 15.3 oz 200 lb 198 lb 2 oz  Weight (kg) 90.7 kg 90.719 kg 89.869 kg      Telemetry Sinus to sinus tach with rates 90-100 with occasional PVC's and PAC's and several burst of PSVT- Personally Reviewed  ECG  No new tracings - Personally Reviewed  Physical Exam  GEN: No acute distress.   Neck:  Unable to determine JVD due to body habitus Cardiac: RRR, II/VI systolic murmur, without rubs, or gallops.  Respiratory: Clear to auscultation bilaterally. GI: Soft, nontender, non-distended  MS: No edema; No deformity. Neuro:  Nonfocal  Psych: Normal affect   Labs High Sensitivity Troponin:  No results for input(s): TROPONINIHS in the last 720 hours.   Chemistry Recent Labs  Lab 12/22/23 1606 12/23/23 1719 12/25/23 1454 12/26/23 0528  NA 132* 131* 126* 135  K 4.2 4.5 4.1 3.7  CL 95* 93* 94* 99  CO2 26 27 18* 24  GLUCOSE 117* 93 79 81  BUN 17 14 11 11   CREATININE 1.41* 1.20* 1.41* 1.37*  CALCIUM  8.7* 9.0 8.7* 8.6*  MG  --   --   --  1.9  PROT 5.8* 6.1*  --   --   ALBUMIN 3.6 3.8  --   --   AST 25 28  --   --   ALT 24 25  --   --   ALKPHOS 88 91  --   --   BILITOT 0.7 0.7  --   --   GFRNONAA 38* 47* 38* 40*  ANIONGAP 11 11 14 12     Lipids No results for input(s): CHOL, TRIG, HDL, LABVLDL, LDLCALC, CHOLHDL in the last 168 hours.  Hematology  Recent Labs  Lab 12/24/23 0520 12/25/23 0352 12/26/23 0528  WBC 6.7 6.9 6.7  RBC 3.37* 3.41* 3.29*  HGB 11.1* 11.2* 11.1*  HCT 33.3* 33.7* 32.1*  MCV 98.8 98.8 97.6  MCH 32.9 32.8 33.7  MCHC 33.3 33.2 34.6  RDW 13.9 14.2 14.6  PLT 147* 176 213   Thyroid  No results for input(s): TSH, FREET4 in the last 168 hours.  BNP Recent Labs  Lab 12/23/23 1719  PROBNP 272.0    DDimer No results for input(s): DDIMER in the last 168 hours.   Radiology    Cardiac Studies Echocardiogram ordered and pending  Patient Profile   78 y.o. 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  Assessment & Plan  Acute pulmonary embolism -Continue  anticoagulation with heparin  infusion -Echocardiogram pending to evaluate RV function -She would likely require lifelong anticoagulation given second episode of pulmonary embolism -Recommend ongoing management outpatient by hematology -Hemoglobin remained stable at 11.1 -Will need to be transitioned to OAC prior to discharge, apixaban  10 mg twice daily x 7 days and then 5 mg twice daily thereafter  Coronary artery disease involving the native coronary arteries with other forms of angina -Status post PCI/DES to the LAD in 2024 -High-sensitivity troponins trended flat not consistent with ACS -Echocardiogram revealed an LVEF greater than 55%, moderate left ventricular hypertrophy and G1 DD -Previously scheduled right and left heart catheterization 12/25/2023 placed on hold due to acute PE -EKG as needed for pain or changes -Started on ezetimibe  10 mg daily -Maintain on heparin  infusion - Will revisit completing right and left heart catheterization as an outpatient  -Continue on telemetry monitoring  PSVT -Several burst of SVT noted on telemetry throughout the day -Diltiazem  dosing increased to 240 mg daily -Continue with telemetry monitoring  Chronic kidney disease stage IIIa -Serum creatinine 1.37 -Baseline serum creatinine 1.2-1.4 -Monitor urine output -Monitor/trend/replete electrolytes as needed -Daily BMP -Avoid nephrotoxic agents were able  Hypertension -Blood pressure 135/65 -Continued on diltiazem  180 mg daily -Vital signs per unit protocol  Hyperlipidemia -Off statin therapy due to elevated LFTs -LDL 72 in 02/2023 -started on Zetia  10 mg daily -Repeat LFT's in 3-4 weeks  COPD -Not in acute exacerbation -Ongoing management per IM   For questions or updates, please contact [facility] Please consult [email or web address] for contact info under       Signed, Osamu Olguin, NP  12/26/2023, 9:07 AM

## 2023-12-26 NOTE — Hospital Course (Addendum)
 Hospital course / significant events:   HPI: Courtney Wilcox is a 78 y.o. female with medical history significant for HTN, CAD s/p stent (09/2022), rheumatoid arthritis on chronic immunosuppressants and prednisone , CKD llla, SVT, COPD, OSA, prior recurrent PE (off Eliquis  x 1 year due to being on DAPT). Presented to ED w/ N/V unable to tolerate po, generalized weakness. Also noting upcoming appt for cardiac cath to rule out pulmonary HTN and has been increasingly SOB over last few weeks. SOB overall worse since hernia surgery 09/2023  Of note, recently in ED 12/06 dx w/ concern for L rib fx probably ater fall a week prior. The fall was described as mechanical, patient was walking to her recliner, feeling somewhat out of breath, and bumped into the furniture causing her to fall over. CT this admission no evidence of rib fx   12/07: CTA PE showed a right sided central and segmental pulmonary emboli without heart strain and trace left pleural effusion. Started on heparin  infusion. Admitted to hosptialist  12/08: Pt lethargic AM following ativan  + phenergan . Cardiology consult - echo pending, consider proceed w/ R/L heart cath this hospitalization. GI consult for persistent nausea - recs continue acid suppression and antiemeditcs, cause likely acute illness +/- GERD/PUD, daughter requests EGD and Gi will consider pending cardiology recs / clearance for anesthesia.  12/09: Echo shows moderate RV df, mech thrombectomy risk > benefit per cardiology, consider hematology consult, continue heparin , possible cath later this week, also increased diltiazem  d/t asymptomatic SVT. Nausea resolved - GI s/o.  12/10: pulmonology consult - maintain 2L O2 wean or can go home w/ O2 if needed, ok for Eliquis  on discharge, DVT US  minimal DVT burden bilaterally. Started Breo for asthma/COPD overlap, encourage incentive spirometer use. Cardio - deferring cath for now. Pt/daughter request further discussion on this, I asked [facility] team to discuss concerns w/ patient/family 12/11: d/w Vasc does not need IVC. PSVT noted on tele, added metoprolol . Cath demonstrated no acute concerns - relative stable 2-vessel CAD w/ patent LAD stent, hemodynamics WNL and no apparent Pulm HTN   12/12: PT/OT ***. Cardiology ***.       Consultants:  Cardiology Gastroenterology Pulmonology   Procedures/Surgeries: Cardiac cath 12/27/23:  * Two-vessel CAD: Widely patent ostial and proximal LAD stent with minimal ISR (appears fully expanded); Progression of proximal RCA lesion from 30% to roughly 55% but still not flow-limiting *Essentially normal Hemodynamics with no evidence of pulmonary pretension      ASSESSMENT & PLAN:   Acute pulmonary embolism, recurrent  History of prior PE 11/2020 Prior to that PE, hx unprovoked DVT, recs for lifelong anticoag but was off this while on DAPT s/p coronary stent 09/2022 Small bilateral DVT -Patient  dyspneic tachycardic and tachypneic in the setting of being off AC x 1 year to decrease bleeding risk with concomitant DAPT -CTA chest on admission showing central and segmental right-sided PE with trace left pleural effusion heparin  gtt Anticipate to Eliquis  on discharge, pend further procedures - clear w/ cardiology prior to transition to DOAC  Curbside d/w vascular - no need for IVC at this time given hasn't failed anticoag and low clot burden.    PSVT Noted on telemetry 12/27/2023 Normal sinus rhythm with episodes of atrial tachycardia through the course of the day diltiazem  ER up to 240 daily start metoprolol  tartrate 25 twice daily  Dyspnea --worked up by outpatient cardio Dr End and pulm Dr. Aleskerov --suspect multifactorial, including asthma/COPD, PE (though small so not likely  a major contributor), CAD though do not suspect actively ischemic, possibly pulmonary HTN, certainly debility and poor reserves w/ recent illnesses/injuries and obesity are also all putting her at a  disadvantage and reducing capacity.   Cardio planning on cath today  Pulmonology consult, added Breo    Asthma/COPD overlap Follow w/ pulmonary  Breo ellipta   Bronchodilator nebs prn  Contusion of chest wall, left, subsequent encounter History of fall off couch hitting left chest and abdomen no rib fracture noted on CT  Norco PRN diclofenac  gel TID   CAD S/P percutaneous coronary angioplasty stent LAD 09/2022 Stable angina  Has been on DAPT since stent 09/2022 --consult cardiology for decision on resumption of antiplatelet of choice Cardiology following  Statin intolerant- cont Zetia   Planon eliquis     Rheumatoid arthritis  On chronic immunosuppressive therapy Will hold meds while in the house Arava  and Plaquenil  resume on discharge    Nausea and vomiting - improved 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 Current CT abdomen and pelvis unrevealing GI has s/o, no plan for EGD during this hospitalization, reengage as needed N/V improved, continue antacid and prn antiemetics .    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  Underweight - under 18  overweight - 25 to 29 obese - 30 or more Class 1 obesity: BMI of 30.0 to 34 Class 2 obesity: BMI of 35.0 to 39 Class 3 obesity: BMI of 40.0 to 49 Super Morbid Obesity: BMI  50-59 Super-super Morbid Obesity: BMI 60+ Healthy nutrition and physical activity advised as adjunct to other disease management and risk reduction treatments    DVT prophylaxis: on heparin  IV fluids: no continuous IV fluids  Nutrition: regular  Central lines / other devices: none  Code Status: FULL CODE ACP documentation reviewed:  none on file in [facility]  TOC needs: pend PT/OT eval expect may need rehab  Medical barriers to dispo: pend cardiology clearance. Expected medical readiness for discharge 1-2 more days.

## 2023-12-26 NOTE — Plan of Care (Signed)
  Problem: Clinical Measurements: Goal: Cardiovascular complication will be avoided Outcome: Progressing   Problem: Activity: Goal: Risk for activity intolerance will decrease Outcome: Progressing   Problem: Elimination: Goal: Will not experience complications related to urinary retention Outcome: Progressing   Problem: Safety: Goal: Ability to remain free from injury will improve Outcome: Progressing

## 2023-12-27 ENCOUNTER — Telehealth (HOSPITAL_COMMUNITY): Payer: Self-pay | Admitting: Pharmacy Technician

## 2023-12-27 ENCOUNTER — Encounter
Admission: EM | Disposition: A | Discharge status: Home Health | Payer: Self-pay | Source: Home / Self Care | Attending: Osteopathic Medicine

## 2023-12-27 ENCOUNTER — Other Ambulatory Visit (HOSPITAL_COMMUNITY): Payer: Self-pay

## 2023-12-27 DIAGNOSIS — I10 Essential (primary) hypertension: Secondary | ICD-10-CM | POA: Diagnosis not present

## 2023-12-27 DIAGNOSIS — N1831 Chronic kidney disease, stage 3a: Secondary | ICD-10-CM | POA: Diagnosis not present

## 2023-12-27 DIAGNOSIS — I25118 Atherosclerotic heart disease of native coronary artery with other forms of angina pectoris: Secondary | ICD-10-CM | POA: Diagnosis not present

## 2023-12-27 HISTORY — PX: RIGHT/LEFT HEART CATH AND CORONARY ANGIOGRAPHY: CATH118266

## 2023-12-27 LAB — POCT I-STAT EG7
Acid-Base Excess: 0 mmol/L (ref 0.0–2.0)
Acid-Base Excess: 1 mmol/L (ref 0.0–2.0)
Bicarbonate: 25.8 mmol/L (ref 20.0–28.0)
Bicarbonate: 26.7 mmol/L (ref 20.0–28.0)
Calcium, Ion: 1.2 mmol/L (ref 1.15–1.40)
Calcium, Ion: 1.23 mmol/L (ref 1.15–1.40)
HCT: 31 % — ABNORMAL LOW (ref 36.0–46.0)
HCT: 32 % — ABNORMAL LOW (ref 36.0–46.0)
Hemoglobin: 10.5 g/dL — ABNORMAL LOW (ref 12.0–15.0)
Hemoglobin: 10.9 g/dL — ABNORMAL LOW (ref 12.0–15.0)
O2 Saturation: 53 %
O2 Saturation: 60 %
Potassium: 3.3 mmol/L — ABNORMAL LOW (ref 3.5–5.1)
Potassium: 3.4 mmol/L — ABNORMAL LOW (ref 3.5–5.1)
Sodium: 134 mmol/L — ABNORMAL LOW (ref 135–145)
Sodium: 135 mmol/L (ref 135–145)
TCO2: 27 mmol/L (ref 22–32)
TCO2: 28 mmol/L (ref 22–32)
pCO2, Ven: 47.3 mmHg (ref 44–60)
pCO2, Ven: 47.5 mmHg (ref 44–60)
pH, Ven: 7.344 (ref 7.25–7.43)
pH, Ven: 7.358 (ref 7.25–7.43)
pO2, Ven: 30 mmHg — CL (ref 32–45)
pO2, Ven: 33 mmHg (ref 32–45)

## 2023-12-27 LAB — POCT I-STAT 7, (LYTES, BLD GAS, ICA,H+H)
Acid-Base Excess: 0 mmol/L (ref 0.0–2.0)
Bicarbonate: 25.2 mmol/L (ref 20.0–28.0)
Calcium, Ion: 1.25 mmol/L (ref 1.15–1.40)
HCT: 32 % — ABNORMAL LOW (ref 36.0–46.0)
Hemoglobin: 10.9 g/dL — ABNORMAL LOW (ref 12.0–15.0)
O2 Saturation: 91 %
Potassium: 3.5 mmol/L (ref 3.5–5.1)
Sodium: 138 mmol/L (ref 135–145)
TCO2: 26 mmol/L (ref 22–32)
pCO2 arterial: 42.2 mmHg (ref 32–48)
pH, Arterial: 7.384 (ref 7.35–7.45)
pO2, Arterial: 63 mmHg — ABNORMAL LOW (ref 83–108)

## 2023-12-27 LAB — CBC
HCT: 34.4 % — ABNORMAL LOW (ref 36.0–46.0)
Hemoglobin: 11.3 g/dL — ABNORMAL LOW (ref 12.0–15.0)
MCH: 33.1 pg (ref 26.0–34.0)
MCHC: 32.8 g/dL (ref 30.0–36.0)
MCV: 100.9 fL — ABNORMAL HIGH (ref 80.0–100.0)
Platelets: 247 K/uL (ref 150–400)
RBC: 3.41 MIL/uL — ABNORMAL LOW (ref 3.87–5.11)
RDW: 14.9 % (ref 11.5–15.5)
WBC: 7.2 K/uL (ref 4.0–10.5)
nRBC: 0 % (ref 0.0–0.2)

## 2023-12-27 LAB — BASIC METABOLIC PANEL WITH GFR
Anion gap: 11 (ref 5–15)
BUN: 10 mg/dL (ref 8–23)
CO2: 25 mmol/L (ref 22–32)
Calcium: 8.8 mg/dL — ABNORMAL LOW (ref 8.9–10.3)
Chloride: 103 mmol/L (ref 98–111)
Creatinine, Ser: 1.4 mg/dL — ABNORMAL HIGH (ref 0.44–1.00)
GFR, Estimated: 39 mL/min — ABNORMAL LOW (ref >60)
Glucose, Bld: 92 mg/dL (ref 70–99)
Potassium: 3.5 mmol/L (ref 3.5–5.1)
Sodium: 140 mmol/L (ref 135–145)

## 2023-12-27 LAB — HEPARIN LEVEL (UNFRACTIONATED): Heparin Unfractionated: 0.57 [IU]/mL (ref 0.30–0.70)

## 2023-12-27 SURGERY — RIGHT/LEFT HEART CATH AND CORONARY ANGIOGRAPHY
Anesthesia: Moderate Sedation

## 2023-12-27 MED ORDER — SODIUM CHLORIDE 0.9% FLUSH
3.0000 mL | Freq: Two times a day (BID) | INTRAVENOUS | Status: DC
  Administered 2023-12-27 – 2023-12-28 (×2): 3 mL via INTRAVENOUS

## 2023-12-27 MED ORDER — IOHEXOL 300 MG/ML  SOLN
INTRAMUSCULAR | Status: DC | PRN
  Administered 2023-12-27: 40 mL

## 2023-12-27 MED ORDER — POTASSIUM CHLORIDE 20 MEQ PO PACK
40.0000 meq | PACK | Freq: Once | ORAL | Status: AC
  Administered 2023-12-27: 40 meq via ORAL
  Filled 2023-12-27 (×2): qty 2

## 2023-12-27 MED ORDER — LIDOCAINE HCL 1 % IJ SOLN
INTRAMUSCULAR | Status: AC
  Filled 2023-12-27: qty 20

## 2023-12-27 MED ORDER — MIDAZOLAM HCL 2 MG/2ML IJ SOLN
INTRAMUSCULAR | Status: AC
  Filled 2023-12-27: qty 2

## 2023-12-27 MED ORDER — VERAPAMIL HCL 2.5 MG/ML IV SOLN
INTRAVENOUS | Status: DC | PRN
  Administered 2023-12-27: 2.5 mg via INTRA_ARTERIAL

## 2023-12-27 MED ORDER — FREE WATER
500.0000 mL | Freq: Once | Status: AC
  Administered 2023-12-27: 500 mL via ORAL

## 2023-12-27 MED ORDER — HEPARIN SODIUM (PORCINE) 1000 UNIT/ML IJ SOLN
INTRAMUSCULAR | Status: AC
  Filled 2023-12-27: qty 10

## 2023-12-27 MED ORDER — HEPARIN (PORCINE) IN NACL 1000-0.9 UT/500ML-% IV SOLN
INTRAVENOUS | Status: DC | PRN
  Administered 2023-12-27 (×2): 500 mL

## 2023-12-27 MED ORDER — SODIUM CHLORIDE 0.9 % IV SOLN: 250.0000 mL | INTRAVENOUS | Status: DC | PRN

## 2023-12-27 MED ORDER — HEPARIN SODIUM (PORCINE) 1000 UNIT/ML IJ SOLN
INTRAMUSCULAR | Status: DC | PRN
  Administered 2023-12-27: 4000 [IU] via INTRAVENOUS

## 2023-12-27 MED ORDER — HEPARIN (PORCINE) 25000 UT/250ML-% IV SOLN
1150.0000 [IU]/h | INTRAVENOUS | Status: DC
  Administered 2023-12-27: 1100 [IU]/h via INTRAVENOUS
  Filled 2023-12-27: qty 250

## 2023-12-27 MED ORDER — HEPARIN (PORCINE) IN NACL 1000-0.9 UT/500ML-% IV SOLN
INTRAVENOUS | Status: AC
  Filled 2023-12-27: qty 1000

## 2023-12-27 MED ORDER — MIDAZOLAM HCL (PF) 2 MG/2ML IJ SOLN
INTRAMUSCULAR | Status: DC | PRN
  Administered 2023-12-27: .5 mg via INTRAVENOUS

## 2023-12-27 MED ORDER — HYDRALAZINE HCL 20 MG/ML IJ SOLN: 10.0000 mg | INTRAMUSCULAR | Status: AC | PRN

## 2023-12-27 MED ORDER — SODIUM CHLORIDE 0.9% FLUSH: 3.0000 mL | INTRAVENOUS | Status: DC | PRN

## 2023-12-27 MED ORDER — VERAPAMIL HCL 2.5 MG/ML IV SOLN
INTRAVENOUS | Status: AC
  Filled 2023-12-27: qty 2

## 2023-12-27 MED ORDER — LIDOCAINE HCL (PF) 1 % IJ SOLN
INTRAMUSCULAR | Status: DC | PRN
  Administered 2023-12-27: 5 mL

## 2023-12-27 MED ORDER — FENTANYL CITRATE (PF) 100 MCG/2ML IJ SOLN
INTRAMUSCULAR | Status: AC
  Filled 2023-12-27: qty 2

## 2023-12-27 MED ADMIN — Metoprolol Tartrate Tab 25 MG: 25 mg | ORAL | NDC 51079025501

## 2023-12-27 MED FILL — Metoprolol Tartrate Tab 25 MG: 25.0000 mg | ORAL | Qty: 1 | Status: AC

## 2023-12-27 SURGICAL SUPPLY — 14 items
CATH 5FR JL3.5 JR4 ANG PIG MP (CATHETERS) IMPLANT
CATH BALLN WEDGE 5F 110CM (CATHETERS) IMPLANT
DEVICE RAD TR BAND REGULAR (VASCULAR PRODUCTS) IMPLANT
DRAPE BRACHIAL (DRAPES) IMPLANT
GLIDESHEATH SLEND SS 6F .021 (SHEATH) IMPLANT
GUIDEWIRE EMER 3M J .025X150CM (WIRE) IMPLANT
GUIDEWIRE INQWIRE 1.5J.035X260 (WIRE) IMPLANT
KIT MICROPUNCTURE VSI 5F STIFF (SHEATH) IMPLANT
PACK CARDIAC CATH (CUSTOM PROCEDURE TRAY) ×1 IMPLANT
PANNUS RETENTION SYSTEM 2 PAD (MISCELLANEOUS) IMPLANT
SET ATX-X65L (MISCELLANEOUS) IMPLANT
SHEATH GLIDE SLENDER 4/5FR (SHEATH) IMPLANT
STATION PROTECTION PRESSURIZED (MISCELLANEOUS) IMPLANT
WIRE HITORQ VERSACORE ST 145CM (WIRE) IMPLANT

## 2023-12-27 NOTE — Evaluation (Signed)
 Occupational Therapy Evaluation Patient Details Name: Courtney Wilcox MRN: [id number] DOB: [birth year] Today's Date: 12/27/2023   History of Present Illness   Courtney Wilcox is a 78 y.o. female with medical history significant for HTN, CAD s/p stent (09/2022), rheumatoid arthritis on chronic immunosuppressants and prednisone , CKD llla, SVT, COPD, OSA, prior recurrent PE (off Eliquis  x 1 year due to being on DAPT). Presented to ED w/ N/V unable to tolerate po, generalized weakness. 12/23/23 CTA PE showed a right sided central and segmental pulmonary emboli without heart strain and trace left pleural effusion.   Clinical Impressions Courtney Wilcox was seen for OT evaluation this date. Prior to hospital admission, pt was IND using AD as needed. Pt lives with daughter. Pt currently requires SBA standing grooming tasks. In room mobility with no AD - initial furniture walking noted requiring CGA, improves to SBA + RW use. HR 85-133 bpm. SpO2 87% on RA with activity, 95% at rest. Pt would benefit from skilled OT to address noted impairments and functional limitations (see below for any additional details). Upon hospital discharge, recommend OT follow up.    If plan is discharge home, recommend the following:   A little help with walking and/or transfers;A little help with bathing/dressing/bathroom;Help with stairs or ramp for entrance     Functional Status Assessment   Patient has had a recent decline in their functional status and demonstrates the ability to make significant improvements in function in a reasonable and predictable amount of time.     Equipment Recommendations   BSC/3in1     Recommendations for Other Services         Precautions/Restrictions   Precautions Precautions: Fall Recall of Precautions/Restrictions: Intact Restrictions Weight Bearing Restrictions Per Provider Order: No     Mobility Bed Mobility               General bed mobility comments: not  tested    Transfers Overall transfer level: Needs assistance Equipment used: None Transfers: Sit to/from Stand Sit to Stand: Contact guard assist                  Balance Overall balance assessment: Needs assistance Sitting-balance support: Feet supported, No upper extremity supported Sitting balance-Leahy Scale: Normal     Standing balance support: During functional activity, Single extremity supported Standing balance-Leahy Scale: Fair                             ADL either performed or assessed with clinical judgement   ADL Overall ADL's : Needs assistance/impaired                                       General ADL Comments: SBA standing grooming tasks, initial furniture walking noted no AD use requiring CGA, improves to SBA + RW use for ADL t/f ~40 ft.       Pertinent Vitals/Pain Pain Assessment Pain Assessment: 0-10 Pain Score: 5  Pain Location: ribcage Pain Descriptors / Indicators: Grimacing Pain Intervention(s): Limited activity within patient's tolerance, RN gave pain meds during session, Patient requesting pain meds-RN notified     Extremity/Trunk Assessment Upper Extremity Assessment Upper Extremity Assessment: Overall WFL for tasks assessed   Lower Extremity Assessment Lower Extremity Assessment: Generalized weakness       Communication Communication Communication: Impaired Factors Affecting Communication: Hearing impaired   Cognition Arousal:  Alert Behavior During Therapy: WFL for tasks assessed/performed Cognition: No apparent impairments                               Following commands: Intact       Cueing  General Comments   Cueing Techniques: Verbal cues  HR 85-133 bpm. SpO2 87% on RA with activity, 95% at rest   Exercises     Shoulder Instructions      Home Living Family/patient expects to be discharged to:: Private residence Living Arrangements: Children Available Help at  Discharge: Family;Available 24 hours/day Type of Home: House Home Access: Stairs to enter [employer] of Steps: 1 Entrance Stairs-Rails: Right Home Layout: One level     Bathroom Shower/Tub: Tub/shower unit         Home Equipment: Rollator (4 wheels);Shower seat;Grab bars - tub/shower;Grab bars - toilet;Rolling Walker (2 wheels)   Additional Comments: daughter works from home      Prior Functioning/Environment Prior Level of Function : Independent/Modified Independent             Mobility Comments: PRN walker use ADLs Comments: PRN dressing/tub t/f assist    OT Problem List: Decreased strength;Decreased range of motion;Decreased activity tolerance;Impaired balance (sitting and/or standing);Decreased safety awareness   OT Treatment/Interventions: Self-care/ADL training;Therapeutic exercise;Energy conservation;DME and/or AE instruction;Therapeutic activities;Patient/family education      OT Goals(Current goals can be found in the care plan section)   Acute Rehab OT Goals Patient Stated Goal: to go home OT Goal Formulation: With patient Time For Goal Achievement: 01/10/24 Potential to Achieve Goals: Good ADL Goals Pt Will Perform Grooming: Independently;standing Pt Will Perform Lower Body Dressing: sit to/from stand;with modified independence;with adaptive equipment Pt Will Transfer to Toilet: Independently;ambulating;regular height toilet   OT Frequency:  Min 2X/week    Co-evaluation              AM-PAC OT 6 Clicks Daily Activity     Outcome Measure Help from another person eating meals?: None Help from another person taking care of personal grooming?: A Little Help from another person toileting, which includes using toliet, bedpan, or urinal?: A Little Help from another person bathing (including washing, rinsing, drying)?: A Lot Help from another person to put on and taking off regular upper body clothing?: A Little Help from another person  to put on and taking off regular lower body clothing?: A Lot 6 Click Score: 17   End of Session Equipment Utilized During Treatment: Rolling walker (2 wheels) Nurse Communication: Mobility status  Activity Tolerance: Patient tolerated treatment well Patient left: in chair;with call bell/phone within reach;with chair alarm set  OT Visit Diagnosis: Other abnormalities of gait and mobility (R26.89);Muscle weakness (generalized) (M62.81)                Time: 9162-9143 OT Time Calculation (min): 19 min Charges:  OT General Charges $OT Visit: 1 Visit OT Evaluation $OT Eval Moderate Complexity: 1 Mod  Elston Slot, M.S. OTR/L  12/27/2023, 9:15 AM  ascom [phone]

## 2023-12-27 NOTE — Progress Notes (Addendum)
 Rounding Note   Patient Name: Courtney Wilcox Date of Encounter: 12/27/2023  [facility] Cardiologist: Lonni Hanson, MD   Subjective Sleeping on rounds this morning, no complaints - No family at the bedside - Systolic pressure 106 up to 130 Continues to report chest pain  Scheduled Meds:  aspirin   81 mg Oral Daily   diclofenac  Sodium  2 g Topical TID   diltiazem   240 mg Oral Daily   ezetimibe   10 mg Oral Daily   fluticasone  furoate-vilanterol  1 puff Inhalation Daily   pantoprazole   40 mg Oral QHS   rosuvastatin   5 mg Oral Daily   sertraline   50 mg Oral Daily   Continuous Infusions:  sodium chloride      heparin  1,100 Units/hr (12/27/23 1057)   promethazine  (PHENERGAN ) injection (IM or IVPB) 12.5 mg (12/24/23 0414)   PRN Meds: acetaminophen  **OR** acetaminophen , famotidine , gabapentin , HYDROcodone -acetaminophen , ipratropium-albuterol , LORazepam , metoprolol  tartrate, ondansetron  **OR** ondansetron  (ZOFRAN ) IV, mouth rinse, promethazine  (PHENERGAN ) injection (IM or IVPB)   Vital Signs  Vitals:   12/26/23 2020 12/26/23 2353 12/27/23 0441 12/27/23 0810  BP: (!) 106/58 (!) 131/54 123/60 106/76  Pulse:  (!) 107 80 81  Resp: 20 16 17 18   Temp: 98.6 F (37 C) 97.8 F (36.6 C) 98.1 F (36.7 C) 98.3 F (36.8 C)  TempSrc:      SpO2: 100% 96% 94% 95%  Weight:      Height:        Intake/Output Summary (Last 24 hours) at 12/27/2023 1221 Last data filed at 12/27/2023 1045 Gross per 24 hour  Intake 240 ml  Output --  Net 240 ml      12/23/2023    5:16 PM 12/22/2023    3:52 PM 11/30/2023    3:19 PM  Last 3 Weights  Weight (lbs) 199 lb 15.3 oz 200 lb 198 lb 2 oz  Weight (kg) 90.7 kg 90.719 kg 89.869 kg      Telemetry Normal sinus rhythm- Personally Reviewed  ECG   - Personally Reviewed  Physical Exam  GEN: No acute distress.  Obese Neck: No JVD Cardiac: RRR, no murmurs, rubs, or gallops.  Respiratory: Clear to auscultation bilaterally. GI:  Soft, nontender, non-distended  MS: No edema; No deformity. Neuro:  Nonfocal  Psych: Normal affect   Labs High Sensitivity Troponin:  No results for input(s): TROPONINIHS in the last 720 hours.  Recent Labs  Lab 12/22/23 1606 12/23/23 1719 12/23/23 1939  TRNPT 40* 38* 35*       Chemistry Recent Labs  Lab 12/22/23 1606 12/23/23 1719 12/25/23 1454 12/26/23 0528 12/27/23 0455  NA 132* 131* 126* 135 140  K 4.2 4.5 4.1 3.7 3.5  CL 95* 93* 94* 99 103  CO2 26 27 18* 24 25  GLUCOSE 117* 93 79 81 92  BUN 17 14 11 11 10   CREATININE 1.41* 1.20* 1.41* 1.37* 1.40*  CALCIUM  8.7* 9.0 8.7* 8.6* 8.8*  MG  --   --   --  1.9  --   PROT 5.8* 6.1*  --   --   --   ALBUMIN 3.6 3.8  --   --   --   AST 25 28  --   --   --   ALT 24 25  --   --   --   ALKPHOS 88 91  --   --   --   BILITOT 0.7 0.7  --   --   --   GFRNONAA 38*  47* 38* 40* 39*  ANIONGAP 11 11 14 12 11     Lipids No results for input(s): CHOL, TRIG, HDL, LABVLDL, LDLCALC, CHOLHDL in the last 168 hours.  Hematology Recent Labs  Lab 12/25/23 0352 12/26/23 0528 12/27/23 0455  WBC 6.9 6.7 7.2  RBC 3.41* 3.29* 3.41*  HGB 11.2* 11.1* 11.3*  HCT 33.7* 32.1* 34.4*  MCV 98.8 97.6 100.9*  MCH 32.8 33.7 33.1  MCHC 33.2 34.6 32.8  RDW 14.2 14.6 14.9  PLT 176 213 247   Thyroid  No results for input(s): TSH, FREET4 in the last 168 hours.  BNP Recent Labs  Lab 12/23/23 1719  PROBNP 272.0    DDimer No results for input(s): DDIMER in the last 168 hours.   Radiology  US  Venous Img Lower Bilateral (DVT) Result Date: 12/26/2023 CLINICAL DATA:  201733 DVT (deep venous thrombosis) (HCC) 201733, small acute pulmonary emboli 12/23/2023 EXAM: BILATERAL LOWER EXTREMITY VENOUS DOPPLER ULTRASOUND TECHNIQUE: Gray-scale sonography with graded compression, as well as color Doppler and duplex ultrasound were performed to evaluate the lower extremity deep venous systems from the level of the common femoral vein and including  the common femoral, femoral, profunda femoral, popliteal and calf veins including the posterior tibial, peroneal and gastrocnemius veins when visible. Spectral Doppler was utilized to evaluate flow at rest and with distal augmentation maneuvers in the common femoral, femoral and popliteal veins. COMPARISON:  None Available. FINDINGS: RIGHT LOWER EXTREMITY Common Femoral Vein: No evidence of thrombus. Normal compressibility, respiratory phasicity and response to augmentation. Saphenofemoral Junction: No evidence of thrombus. Normal compressibility and flow on color Doppler imaging. Profunda Femoral Vein: No evidence of thrombus. Normal compressibility and flow on color Doppler imaging. Femoral Vein: No evidence of thrombus. Normal compressibility, respiratory phasicity and response to augmentation. Popliteal Vein: No evidence of thrombus. Normal compressibility, respiratory phasicity and response to augmentation. Calf Veins: Posterior tibial and peroneal calf veins appear patent and compressible. Right gastrocnemius veins are noted to be distended with intraluminal thrombus and noncompressible compatible with DVT. Overall very minimal thrombus burden. LEFT LOWER EXTREMITY Common Femoral Vein: No evidence of thrombus. Normal compressibility, respiratory phasicity and response to augmentation. Saphenofemoral Junction: No evidence of thrombus. Normal compressibility and flow on color Doppler imaging. Profunda Femoral Vein: No evidence of thrombus. Normal compressibility and flow on color Doppler imaging. Femoral Vein: No evidence of thrombus. Normal compressibility, respiratory phasicity and response to augmentation. Popliteal Vein: No evidence of thrombus. Normal compressibility, respiratory phasicity and response to augmentation. Calf Veins: Left posterior tibial and peroneal veins are patent and compressible. left gastrocnemius veins are also noted to be noncompressible with some degree of hypoechoic intraluminal  thrombus. Overall very minimal thrombus burden. IMPRESSION: Bilateral gastrocnemius calf DVT. Overall very low thrombus burden. Electronically Signed   By: CHRISTELLA.  Shick M.D.   On: 12/26/2023 13:00     Patient Profile   78 y.o. 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   Assessment & Plan  Acute pulmonary embolism Presenting with pain to ribs, vomiting, pain on inspiration, recent fall - CT chest with right sided central and segmental pulmonary emboli without right heart strain - On heparin  infusion - Given prior history of PE, will require lifelong anticoagulation - Hemoglobin 11.3, stable - After cardiac catheterization, plan for Eliquis  10 twice  daily 7 days then down to 5 twice daily   Coronary artery disease with stable angina Prior stent proximal LAD 2024 -Has been seen as outpatient in clinic with symptoms of recurrent chest pain -Was scheduled for right and left heart catheterization -This admission reporting continued chest pain, family and patient preferred to have cardiac catheterization performed this admission, and prior to initiation of Eliquis  -Normal ejection fraction on echo - Zetia  for hyperlipidemia given statin intolerance - On aspirin  and heparin    PSVT Noted on telemetry, diltiazem  ER up to 240 daily - Normal sinus rhythm with episodes of atrial tachycardia through the course of the day - Will start metoprolol  tartrate 25 twice daily   Chronic kidney disease stage III AA Baseline creatinine 1.4, stable   Essential hypertension Continue diltiazem  ER 240 daily -For hypotension may need to decrease diltiazem  ER down to 180 daily -Will need to check orthostatics prior to discharge    Hyperlipidemia LDL above goal, started on Zetia  10 daily   COPD Breathing stable on oxygen Will wean off as tolerated -Deconditioned   Weakness Will need PT evaluation   For questions or updates, please contact [facility] Please consult [email or web address] for contact info under     Signed, Divante Kotch, MD  12/27/2023, 12:21 PM

## 2023-12-27 NOTE — Consult Note (Signed)
 PHARMACY - ANTICOAGULATION CONSULT NOTE  Pharmacy Consult for Heparin   Indication: pulmonary embolus  Patient Measurements: Height: 5' 4 (162.6 cm) Weight: 90.7 kg (199 lb 15.3 oz) IBW/kg (Calculated) : 54.7 HEPARIN  DW (KG): 75.1  Labs: Recent Labs    12/25/23 0352 12/25/23 1454 12/26/23 0019 12/26/23 0528 12/27/23 0455  HGB 11.2*  --   --  11.1* 11.3*  HCT 33.7*  --   --  32.1* 34.4*  PLT 176  --   --  213 247  HEPARINUNFRC 0.72* 0.66 0.55 0.53 0.57  CREATININE  --  1.41*  --  1.37*  --     Estimated Creatinine Clearance: 38.1 mL/min (A) (by C-G formula based on SCr of 1.37 mg/dL (H)).   Medical History: Past Medical History:  Diagnosis Date   Allergic rhinitis    Anxiety    Arthritis    Asthma    Atrophic vaginitis    B12 deficiency    Bronchitis, chronic (HCC)    CAD (coronary artery disease)    a. 03/2003 Cath: Nl cors; b. 09/2022 Staged PCI: LM nl, LAD 85p/61m (3.5x26 Onyx Frontier DES), RI small, mild dzs, LCX nl, OM2 mild dzs, RCA 30p.   Cervical disc disease    CKD (chronic kidney disease), stage III (HCC)    Collagen vascular disease    RA   COPD (chronic obstructive pulmonary disease) (HCC)    Cough - post-COVID    Depression    Diastolic dysfunction    a. 11/2020 Echo: EF 45-50%, GrI DD; b. 10/2021 Echo: EF 60-65%, GrII DD; c. 09/2022 Echo: EF 55-60%, no rwma, GrI DD, nl RV fxn, mildly dil LA. No significant valvular dzs; d. 04/2023 Echo: EF 60-65% no rwma, mild LVH, nl RV fxn, RVSP 23.87mmHg. AoV sclerosis.   Fibrocystic breast disease    Foot ulcer (HCC)    GERD (gastroesophageal reflux disease)    Hammer toe    Hyperlipidemia    IBS (irritable bowel syndrome)    Macrocytic anemia    Macular degeneration 2023   Nausea and vomiting 12/23/2023   Obesity    OSA (obstructive sleep apnea)    Osteoarthrosis    Multi site   Osteopenia    PSVT (paroxysmal supraventricular tachycardia)    a. 04/2022 [facility]: Sinus rhythm @ 57 (42-101). Rare PACs/PVCs. [id number]  SVT runs lasting up to 39m 12sec - max rate 222. 5 beats NSVT. Triggered events = sinus, SVT, PACs.   Rheumatoid arthritis (HCC)    Shingles    Thyroid  nodule    MULTIPLE   Tonsillitis    recurrent   Umbilical hernia    Assessment: Pt presents to the ED after a fall and increasing SOB last few weeks. Hx of aflutter, SVT, total replacement of hip, CAD, and CKD. Head CT, no note of bleed. Chest CT: Right-sided central and segmental pulmonary emboli. No right heart strain. CBC stable. No note of DOAC PTA. Pharmacy consulted to start heparin  infusion for PE.   Goal of Therapy:  Heparin  level 0.3-0.7 units/ml Monitor platelets by anticoagulation protocol: Yes   12/9 1454 HL 0.66, therapeutic x 1 12/10 0019 HL 0.55, therapeutic x 2 12/10 0528 HL 0.53, therapeutic x 3 12/11 0.57 HL 0.57.   Plan:  Heparin  level is therapeutic. Will continue heparin  infusion at 1100 units/hr. Recheck heparin  level and CBC with AM labs.   Cathaleen Blanch, PharmD 12/27/2023 5:35 AM

## 2023-12-27 NOTE — H&P (View-Only) (Signed)
 Rounding Note   Patient Name: Courtney Wilcox Date of Encounter: 12/27/2023  [facility] Cardiologist: Lonni Hanson, MD   Subjective Sleeping on rounds this morning, no complaints - No family at the bedside - Systolic pressure 106 up to 130 Continues to report chest pain  Scheduled Meds:  aspirin   81 mg Oral Daily   diclofenac  Sodium  2 g Topical TID   diltiazem   240 mg Oral Daily   ezetimibe   10 mg Oral Daily   fluticasone  furoate-vilanterol  1 puff Inhalation Daily   pantoprazole   40 mg Oral QHS   rosuvastatin   5 mg Oral Daily   sertraline   50 mg Oral Daily   Continuous Infusions:  sodium chloride      heparin  1,100 Units/hr (12/27/23 1057)   promethazine  (PHENERGAN ) injection (IM or IVPB) 12.5 mg (12/24/23 0414)   PRN Meds: acetaminophen  **OR** acetaminophen , famotidine , gabapentin , HYDROcodone -acetaminophen , ipratropium-albuterol , LORazepam , metoprolol  tartrate, ondansetron  **OR** ondansetron  (ZOFRAN ) IV, mouth rinse, promethazine  (PHENERGAN ) injection (IM or IVPB)   Vital Signs  Vitals:   12/26/23 2020 12/26/23 2353 12/27/23 0441 12/27/23 0810  BP: (!) 106/58 (!) 131/54 123/60 106/76  Pulse:  (!) 107 80 81  Resp: 20 16 17 18   Temp: 98.6 F (37 C) 97.8 F (36.6 C) 98.1 F (36.7 C) 98.3 F (36.8 C)  TempSrc:      SpO2: 100% 96% 94% 95%  Weight:      Height:        Intake/Output Summary (Last 24 hours) at 12/27/2023 1221 Last data filed at 12/27/2023 1045 Gross per 24 hour  Intake 240 ml  Output --  Net 240 ml      12/23/2023    5:16 PM 12/22/2023    3:52 PM 11/30/2023    3:19 PM  Last 3 Weights  Weight (lbs) 199 lb 15.3 oz 200 lb 198 lb 2 oz  Weight (kg) 90.7 kg 90.719 kg 89.869 kg      Telemetry Normal sinus rhythm- Personally Reviewed  ECG   - Personally Reviewed  Physical Exam  GEN: No acute distress.  Obese Neck: No JVD Cardiac: RRR, no murmurs, rubs, or gallops.  Respiratory: Clear to auscultation bilaterally. GI:  Soft, nontender, non-distended  MS: No edema; No deformity. Neuro:  Nonfocal  Psych: Normal affect   Labs High Sensitivity Troponin:  No results for input(s): TROPONINIHS in the last 720 hours.  Recent Labs  Lab 12/22/23 1606 12/23/23 1719 12/23/23 1939  TRNPT 40* 38* 35*       Chemistry Recent Labs  Lab 12/22/23 1606 12/23/23 1719 12/25/23 1454 12/26/23 0528 12/27/23 0455  NA 132* 131* 126* 135 140  K 4.2 4.5 4.1 3.7 3.5  CL 95* 93* 94* 99 103  CO2 26 27 18* 24 25  GLUCOSE 117* 93 79 81 92  BUN 17 14 11 11 10   CREATININE 1.41* 1.20* 1.41* 1.37* 1.40*  CALCIUM  8.7* 9.0 8.7* 8.6* 8.8*  MG  --   --   --  1.9  --   PROT 5.8* 6.1*  --   --   --   ALBUMIN 3.6 3.8  --   --   --   AST 25 28  --   --   --   ALT 24 25  --   --   --   ALKPHOS 88 91  --   --   --   BILITOT 0.7 0.7  --   --   --   GFRNONAA 38*  47* 38* 40* 39*  ANIONGAP 11 11 14 12 11     Lipids No results for input(s): CHOL, TRIG, HDL, LABVLDL, LDLCALC, CHOLHDL in the last 168 hours.  Hematology Recent Labs  Lab 12/25/23 0352 12/26/23 0528 12/27/23 0455  WBC 6.9 6.7 7.2  RBC 3.41* 3.29* 3.41*  HGB 11.2* 11.1* 11.3*  HCT 33.7* 32.1* 34.4*  MCV 98.8 97.6 100.9*  MCH 32.8 33.7 33.1  MCHC 33.2 34.6 32.8  RDW 14.2 14.6 14.9  PLT 176 213 247   Thyroid  No results for input(s): TSH, FREET4 in the last 168 hours.  BNP Recent Labs  Lab 12/23/23 1719  PROBNP 272.0    DDimer No results for input(s): DDIMER in the last 168 hours.   Radiology  US  Venous Img Lower Bilateral (DVT) Result Date: 12/26/2023 CLINICAL DATA:  201733 DVT (deep venous thrombosis) (HCC) 201733, small acute pulmonary emboli 12/23/2023 EXAM: BILATERAL LOWER EXTREMITY VENOUS DOPPLER ULTRASOUND TECHNIQUE: Gray-scale sonography with graded compression, as well as color Doppler and duplex ultrasound were performed to evaluate the lower extremity deep venous systems from the level of the common femoral vein and including  the common femoral, femoral, profunda femoral, popliteal and calf veins including the posterior tibial, peroneal and gastrocnemius veins when visible. Spectral Doppler was utilized to evaluate flow at rest and with distal augmentation maneuvers in the common femoral, femoral and popliteal veins. COMPARISON:  None Available. FINDINGS: RIGHT LOWER EXTREMITY Common Femoral Vein: No evidence of thrombus. Normal compressibility, respiratory phasicity and response to augmentation. Saphenofemoral Junction: No evidence of thrombus. Normal compressibility and flow on color Doppler imaging. Profunda Femoral Vein: No evidence of thrombus. Normal compressibility and flow on color Doppler imaging. Femoral Vein: No evidence of thrombus. Normal compressibility, respiratory phasicity and response to augmentation. Popliteal Vein: No evidence of thrombus. Normal compressibility, respiratory phasicity and response to augmentation. Calf Veins: Posterior tibial and peroneal calf veins appear patent and compressible. Right gastrocnemius veins are noted to be distended with intraluminal thrombus and noncompressible compatible with DVT. Overall very minimal thrombus burden. LEFT LOWER EXTREMITY Common Femoral Vein: No evidence of thrombus. Normal compressibility, respiratory phasicity and response to augmentation. Saphenofemoral Junction: No evidence of thrombus. Normal compressibility and flow on color Doppler imaging. Profunda Femoral Vein: No evidence of thrombus. Normal compressibility and flow on color Doppler imaging. Femoral Vein: No evidence of thrombus. Normal compressibility, respiratory phasicity and response to augmentation. Popliteal Vein: No evidence of thrombus. Normal compressibility, respiratory phasicity and response to augmentation. Calf Veins: Left posterior tibial and peroneal veins are patent and compressible. left gastrocnemius veins are also noted to be noncompressible with some degree of hypoechoic intraluminal  thrombus. Overall very minimal thrombus burden. IMPRESSION: Bilateral gastrocnemius calf DVT. Overall very low thrombus burden. Electronically Signed   By: CHRISTELLA.  Shick M.D.   On: 12/26/2023 13:00     Patient Profile   78 y.o. 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   Assessment & Plan  Acute pulmonary embolism Presenting with pain to ribs, vomiting, pain on inspiration, recent fall - CT chest with right sided central and segmental pulmonary emboli without right heart strain - On heparin  infusion - Given prior history of PE, will require lifelong anticoagulation - Hemoglobin 11.3, stable - After cardiac catheterization, plan for Eliquis  10 twice  daily 7 days then down to 5 twice daily   Coronary artery disease with stable angina Prior stent proximal LAD 2024 -Has been seen as outpatient in clinic with symptoms of recurrent chest pain -Was scheduled for right and left heart catheterization -This admission reporting continued chest pain, family and patient preferred to have cardiac catheterization performed this admission, and prior to initiation of Eliquis  -Normal ejection fraction on echo - Zetia  for hyperlipidemia given statin intolerance - On aspirin  and heparin    PSVT Noted on telemetry, diltiazem  ER up to 240 daily - Normal sinus rhythm with episodes of atrial tachycardia through the course of the day - Will start metoprolol  tartrate 25 twice daily   Chronic kidney disease stage III AA Baseline creatinine 1.4, stable   Essential hypertension Continue diltiazem  ER 240 daily -For hypotension may need to decrease diltiazem  ER down to 180 daily -Will need to check orthostatics prior to discharge    Hyperlipidemia LDL above goal, started on Zetia  10 daily   COPD Breathing stable on oxygen Will wean off as tolerated -Deconditioned   Weakness Will need PT evaluation   For questions or updates, please contact [facility] Please consult [email or web address] for contact info under     Signed, Divante Kotch, MD  12/27/2023, 12:21 PM

## 2023-12-27 NOTE — Interval H&P Note (Signed)
 History and Physical Interval Note:  12/27/2023 4:00 PM  Courtney Wilcox  has presented today for surgery, with the diagnosis of pulmonary HTN, CAD, prior PCI, chest pain.  The various methods of treatment have been discussed with the patient and family. After consideration of risks, benefits and other options for treatment, the patient has consented to  Procedures: RIGHT/LEFT HEART CATH AND CORONARY ANGIOGRAPHY (N/A)  PERCUTANEOUS CORONARY INTERVENTION  as a surgical intervention.  The patient's history has been reviewed, patient examined, no change in status, stable for surgery.  I have reviewed the patient's chart and labs.  Questions were answered to the patient's satisfaction.    Cath Lab Visit (complete for each Cath Lab visit)  Clinical Evaluation Leading to the Procedure:   ACS: No.  Non-ACS:    Anginal Classification: CCS III  Anti-ischemic medical therapy: Minimal Therapy (1 class of medications)  Non-Invasive Test Results: No non-invasive testing performed  Prior CABG: No previous CABG    Alm Clay

## 2023-12-27 NOTE — Progress Notes (Signed)
 PT Cancellation Note  Patient Details Name: BRANTLEY NASER MRN: [id number] DOB: February 05, 1945   Cancelled Treatment:    Reason Eval/Treat Not Completed: Other (comment): Hold PT services this date per MD request with pt pending cardiac cath.  Will attempt to see pt at a future date/time as medically appropriate.    CHARM Glendia Bertin PT, DPT 12/27/2023, 10:14 AM

## 2023-12-27 NOTE — Progress Notes (Signed)
 PROGRESS NOTE    Courtney Wilcox   FMW:[id number] DOB: 11-19-1945  DOA: 12/23/2023 Date of Service: 12/27/2023 which is hospital day 3  PCP: Marikay Eva POUR, [facility] course / significant events:   HPI: Courtney Wilcox is a 78 y.o. female with medical history significant for HTN, CAD s/p stent (09/2022), rheumatoid arthritis on chronic immunosuppressants and prednisone , CKD llla, SVT, COPD, OSA, prior recurrent PE (off Eliquis  x 1 year due to being on DAPT). Presented to ED w/ N/V unable to tolerate po, generalized weakness. Also noting upcoming appt for cardiac cath to rule out pulmonary HTN and has been increasingly SOB over last few weeks. SOB overall worse since hernia surgery 09/2023  Of note, recently in ED 12/06 dx w/ concern for L rib fx probably ater fall a week prior. The fall was described as mechanical, patient was walking to her recliner, feeling somewhat out of breath, and bumped into the furniture causing her to fall over. CT this admission no evidence of rib fx   12/07: CTA PE showed a right sided central and segmental pulmonary emboli without heart strain and trace left pleural effusion. Started on heparin  infusion. Admitted to hosptialist  12/08: Pt lethargic AM following ativan  + phenergan . Cardiology consult - echo pending, consider proceed w/ R/L heart cath this hospitalization. GI consult for persistent nausea - recs continue acid suppression and antiemeditcs, cause likely acute illness +/- GERD/PUD, daughter requests EGD and Gi will consider pending cardiology recs / clearance for anesthesia.  12/09: Echo shows moderate RV df, mech thrombectomy risk > benefit per cardiology, consider hematology consult, continue heparin , possible cath later this week, also increased diltiazem  d/t asymptomatic SVT. Nausea resolved - GI s/o.  12/10: pulmonology consult - maintain 2L O2 wean or can go home w/ O2 if needed, ok for Eliquis  on discharge, DVT US  minimal DVT burden  bilaterally. Started Breo for asthma/COPD overlap, encourage incentive spirometer use. Cardio - deferring cath for now. Pt/daughter request further discussion on this, I asked [facility] team to discuss concerns w/ patient/family 12/11: cardiac cath scheduled for this afternoon - d/w Vasc does not need IVC. PSVT noted on tele, added metoprolol . Cath demonstrated no acute concerns       Consultants:  Cardiology Gastroenterology Pulmonology   Procedures/Surgeries: Cardiac cath today:  * Two-vessel CAD: - Widely patent ostial and proximal LAD stent with minimal ISR (appears fully expanded) - Progression of proximal RCA lesion from 30% to roughly 55% but still not flow-limiting *Essentially normal Hemodynamics with no evidence of pul, monary pretension: PAP-mean 37/10-20 mmHg (2.4 Woods units) with PCWP of 10 mm with an LVEDP of 11 mmHg. *Ao sat 91% percent, PA sat 56% with a Fick Cardiac Output of Index of 5.01-2.56       ASSESSMENT & PLAN:   Acute pulmonary embolism, recurrent  History of prior PE 11/2020 Prior to that PE, hx unprovoked DVT, recs for lifelong anticoag but was off this while on DAPT s/p coronary stent 09/2022 Small bilateral DVT -Patient  dyspneic tachycardic and tachypneic in the setting of being off AC x 1 year to decrease bleeding risk with concomitant DAPT -CTA chest on admission showing central and segmental right-sided PE with trace left pleural effusion heparin  gtt Anticipate to Eliquis  on discharge, pend further procedures - clear w/ cardiology prior to transition to DOAC  Curbside d/w vascular - no need for IVC at this time given hasn't failed anticoag and low clot burden.  PSVT Noted on telemetry 12/27/2023 Normal sinus rhythm with episodes of atrial tachycardia through the course of the day diltiazem  ER up to 240 daily start metoprolol  tartrate 25 twice daily  Dyspnea --worked up by outpatient cardio Dr End and pulm Dr. Aleskerov --suspect  multifactorial, including asthma/COPD, PE (though small so not likely a major contributor), CAD though do not suspect actively ischemic, possibly pulmonary HTN, certainly debility and poor reserves w/ recent illnesses/injuries and obesity are also all putting her at a disadvantage and reducing capacity.   Cardio planning on cath today  Pulmonology consult, added Breo    Asthma/COPD overlap Follow w/ pulmonary  Breo ellipta   Bronchodilator nebs prn  Contusion of chest wall, left, subsequent encounter History of fall off couch hitting left chest and abdomen no rib fracture noted on CT  Norco PRN diclofenac  gel TID   CAD S/P percutaneous coronary angioplasty stent LAD 09/2022 Stable angina  Has been on DAPT since stent 09/2022 --consult cardiology for decision on resumption of antiplatelet of choice Cardiology following  Statin intolerant- cont Zetia   Planon eliquis     Rheumatoid arthritis  On chronic immunosuppressive therapy Will hold meds while in the house Arava  and Plaquenil  resume on discharge    Nausea and vomiting - improved History of erosive gastritis (EGD 2023 with biopsy) Etiology uncertain, gastritis EGD 2023: Biopsies were taken-showed reflux, reactive and healing erosive gastritis. Current CT abdomen and pelvis unrevealing GI has s/o, no plan for EGD during this hospitalization, reengage as needed N/V improved, continue antacid and prn antiemetics .    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  Underweight - under 18  overweight - 25 to 29 obese - 30 or more Class 1 obesity: BMI of 30.0 to 34 Class 2 obesity: BMI of 35.0 to 39 Class 3 obesity: BMI of 40.0 to 49 Super Morbid Obesity: BMI 50-59 Super-super Morbid Obesity: BMI 60+ Healthy nutrition and physical activity advised as adjunct to other disease management and risk reduction treatments    DVT prophylaxis: on heparin  IV fluids: no continuous IV fluids  Nutrition: regular  Central lines / other devices: none  Code Status: FULL CODE ACP documentation reviewed:  none on file in [facility]  TOC needs: pend PT/OT eval expect may need rehab  Medical barriers to dispo: pend cardiology clearance. Expected medical readiness for discharge 1-2 more days.              Subjective / Brief ROS:  Pt in procedure / recovery most of the day  Family Communication: none at this time    Objective Findings:  Vitals:   12/27/23 1800 12/27/23 1815 12/27/23 1830 12/27/23 1845  BP: 134/71 104/74 120/76 125/62  Pulse:  64 67 68  Resp:  (!) 21 15 20   Temp:      TempSrc:      SpO2:  92% 95% 94%  Weight:      Height:        Intake/Output  Summary (Last 24 hours) at 12/27/2023 1853 Last data filed at 12/27/2023 1045 Gross per 24 hour  Intake 240 ml  Output --  Net 240 ml   Filed Weights   12/23/23 1716  Weight: 90.7 kg           Scheduled Medications:   [MAR Hold] aspirin   81 mg Oral Daily   [MAR Hold] diclofenac  Sodium  2 g Topical TID   [MAR Hold] diltiazem   240 mg Oral Daily   [MAR Hold] ezetimibe   10 mg Oral Daily   [MAR Hold] fluticasone  furoate-vilanterol  1 puff Inhalation Daily   free water  500 mL Oral Once   [MAR Hold] metoprolol  tartrate  25 mg Oral BID   [MAR Hold] pantoprazole   40 mg Oral QHS   potassium chloride   40 mEq Oral Once   [MAR Hold] rosuvastatin   5 mg Oral Daily   [MAR Hold] sertraline   50 mg Oral Daily   sodium chloride   flush  3 mL Intravenous Q12H    Continuous Infusions:  sodium chloride      [MAR Hold] promethazine  (PHENERGAN ) injection (IM or IVPB) 12.5 mg (12/24/23 0414)    PRN Medications:  sodium chloride , [MAR Hold] acetaminophen  **OR** [MAR Hold] acetaminophen , [MAR Hold] famotidine , [MAR Hold] gabapentin , hydrALAZINE, [MAR Hold] HYDROcodone -acetaminophen , [MAR Hold] ipratropium-albuterol , [MAR Hold] LORazepam , [MAR Hold] metoprolol  tartrate, [MAR Hold] ondansetron  **OR** [MAR Hold] ondansetron  (ZOFRAN ) IV, [MAR Hold] mouth rinse, [MAR Hold] promethazine  (PHENERGAN ) injection (IM or IVPB), sodium chloride  flush  Antimicrobials from admission:  Anti-infectives (From admission, onward)    None           Data Reviewed:  I have personally reviewed the following...  CBC: Recent Labs  Lab 12/23/23 1719 12/24/23 0520 12/25/23 0352 12/26/23 0528 12/27/23 0455 12/27/23 1655 12/27/23 1703 12/27/23 1704  WBC 7.9 6.7 6.9 6.7 7.2  --   --   --   HGB 12.9 11.1* 11.2* 11.1* 11.3* 10.9* 10.9* 10.5*  HCT 37.8 33.3* 33.7* 32.1* 34.4* 32.0* 32.0* 31.0*  MCV 98.2 98.8 98.8 97.6 100.9*  --   --   --   PLT 143* 147* 176 213 247  --   --   --    Basic Metabolic Panel: Recent Labs  Lab 12/22/23 1606 12/23/23 1719 12/25/23 1454 12/26/23 0528 12/27/23 0455 12/27/23 1655 12/27/23 1703 12/27/23 1704  NA 132* 131* 126* 135 140 138 135 134*  K 4.2 4.5 4.1 3.7 3.5 3.5 3.4* 3.3*  CL 95* 93* 94* 99 103  --   --   --   CO2 26 27 18* 24 25  --   --   --   GLUCOSE 117* 93 79 81 92  --   --   --   BUN 17 14 11 11 10   --   --   --   CREATININE 1.41* 1.20* 1.41* 1.37* 1.40*  --   --   --   CALCIUM  8.7* 9.0 8.7* 8.6* 8.8*  --   --   --   MG  --   --   --  1.9  --   --   --   --    GFR: Estimated Creatinine Clearance: 37.3 mL/min (A) (by C-G formula based on SCr of 1.4 mg/dL (H)). Liver Function Tests: Recent Labs  Lab 12/22/23 1606 12/23/23 1719  AST 25 28  ALT 24 25  ALKPHOS 88 91  BILITOT  0.7 0.7  PROT 5.8* 6.1*  ALBUMIN 3.6 3.8  No results for input(s): LIPASE, AMYLASE in the last 168 hours. No results for input(s): AMMONIA in the last 168 hours. Coagulation Profile: Recent Labs  Lab 12/23/23 1939  INR 1.0   Cardiac Enzymes: No results for input(s): CKTOTAL, CKMB, CKMBINDEX, TROPONINI in the last 168 hours. BNP (last 3 results) Recent Labs    12/23/23 1719  PROBNP 272.0   HbA1C: No results for input(s): HGBA1C in the last 72 hours. CBG: No results for input(s): GLUCAP in the last 168 hours. Lipid Profile: No results for input(s): CHOL, HDL, LDLCALC, TRIG, CHOLHDL, LDLDIRECT in the last 72 hours. Thyroid  Function Tests: No results for input(s): TSH, T4TOTAL, FREET4, T3FREE, THYROIDAB in the last 72 hours. Anemia Panel: No results for input(s): VITAMINB12, FOLATE, FERRITIN, TIBC, IRON , RETICCTPCT in the last 72 hours. Most Recent Urinalysis On File:     Component Value Date/Time   COLORURINE YELLOW (A) 06/13/2023 2358   APPEARANCEUR CLEAR (A) 06/13/2023 2358   LABSPEC 1.042 (H) 06/13/2023 2358   PHURINE 5.0 06/13/2023 2358   GLUCOSEU NEGATIVE 06/13/2023 2358   HGBUR NEGATIVE 06/13/2023 2358   BILIRUBINUR NEGATIVE 06/13/2023 2358   KETONESUR NEGATIVE 06/13/2023 2358   PROTEINUR NEGATIVE 06/13/2023 2358   NITRITE NEGATIVE 06/13/2023 2358   LEUKOCYTESUR TRACE (A) 06/13/2023 2358   Sepsis Labs: @LABRCNTIP (procalcitonin:4,lacticidven:4) Microbiology: No results found for this or any previous visit (from the past 240 hours).    Radiology Studies last 3 days: CARDIAC CATHETERIZATION Result Date: 12/27/2023 Table formatting from the original result was not included. Images from the original result were not included.   Previously placed Prox LAD to Mid LAD Drug-Eluting Stent type is  widely patent.   Prox RCA lesion is 55% stenosed, indicating mild progression of disease.   LV End Diastolic Pressure is  normal.  Mean Pulm Arterial Pressure Is Normal (20 mmHg-2.4 Woods units) Dominance: Co-dominant   Two-vessel CAD: Widely patent ostial and proximal LAD stent with minimal ISR (appears fully expanded) Progression of proximal RCA lesion from 30% to roughly 55% but still not flow-limiting Essentially normal Hemodynamics No evidence of Pulmonary Hypertension PAP-mean 37/10-20 mmHg (2.4 Woods units) with PCWP of 10 mmHg with an LVEDP of 11 mmHg. Ao sat 91% percent, PA sat 56% with a Fick Cardiac Output of Index of 5.01-2.56  RECOMMENDATIONS   Anticipated discharge date to be determined.   Recommend to resume Apixaban , at currently prescribed dose and frequency on 12/28/2023.   Concurrent antiplatelet therapy not recommended.   Will try to avoid double therapy with DOAC plus Thienopyridine.  Based on one of the patent stent 1 year out from PCI, would simply initiate DOAC on 12/28/2023 using IV heparin  overnight 2 hours after TR band removal. Alm Clay, MD  US  Venous Img Lower Bilateral (DVT) Result Date: 12/26/2023 CLINICAL DATA:  201733 DVT (deep venous thrombosis) (HCC) 201733, small acute pulmonary emboli 12/23/2023 EXAM: BILATERAL LOWER EXTREMITY VENOUS DOPPLER ULTRASOUND TECHNIQUE: Gray-scale sonography with graded compression, as well as color Doppler and duplex ultrasound were performed to evaluate the lower extremity deep venous systems from the level of the common femoral vein and including the common femoral, femoral, profunda femoral, popliteal and calf veins including the posterior tibial, peroneal and gastrocnemius veins when visible. Spectral Doppler was utilized to evaluate flow at rest and with distal augmentation maneuvers in the common femoral, femoral and popliteal veins. COMPARISON:  None Available. FINDINGS: RIGHT LOWER EXTREMITY Common Femoral Vein: No evidence of thrombus. Normal compressibility, respiratory phasicity and response to augmentation. Saphenofemoral  Junction: No evidence of  thrombus. Normal compressibility and flow on color Doppler imaging. Profunda Femoral Vein: No evidence of thrombus. Normal compressibility and flow on color Doppler imaging. Femoral Vein: No evidence of thrombus. Normal compressibility, respiratory phasicity and response to augmentation. Popliteal Vein: No evidence of thrombus. Normal compressibility, respiratory phasicity and response to augmentation. Calf Veins: Posterior tibial and peroneal calf veins appear patent and compressible. Right gastrocnemius veins are noted to be distended with intraluminal thrombus and noncompressible compatible with DVT. Overall very minimal thrombus burden. LEFT LOWER EXTREMITY Common Femoral Vein: No evidence of thrombus. Normal compressibility, respiratory phasicity and response to augmentation. Saphenofemoral Junction: No evidence of thrombus. Normal compressibility and flow on color Doppler imaging. Profunda Femoral Vein: No evidence of thrombus. Normal compressibility and flow on color Doppler imaging. Femoral Vein: No evidence of thrombus. Normal compressibility, respiratory phasicity and response to augmentation. Popliteal Vein: No evidence of thrombus. Normal compressibility, respiratory phasicity and response to augmentation. Calf Veins: Left posterior tibial and peroneal veins are patent and compressible. left gastrocnemius veins are also noted to be noncompressible with some degree of hypoechoic intraluminal thrombus. Overall very minimal thrombus burden. IMPRESSION: Bilateral gastrocnemius calf DVT. Overall very low thrombus burden. Electronically Signed   By: CHRISTELLA.  Shick M.D.   On: 12/26/2023 13:00   ECHOCARDIOGRAM COMPLETE Result Date: 12/25/2023    ECHOCARDIOGRAM REPORT   Patient Name:   FABLE HUISMAN Date of Exam: 12/25/2023 Medical Rec #:  [id number]         Height:       64.0 in Accession #:    [id number]        Weight:       200.0 lb Date of Birth:  1945-05-23         BSA:          1.956 m Patient Age:    78  years          BP:           152/138 mmHg Patient Gender: F                 HR:           70 bpm. Exam Location:  [facility] Procedure: 2D Echo, Cardiac Doppler and Color Doppler (Both Spectral and Color            Flow Doppler were utilized during procedure). Indications:     Dyspnea R06.00                  Pulmonary embolus I26.09  History:         Patient has prior history of Echocardiogram examinations, most                  recent 04/26/2023. COPD; Risk Factors:Dyslipidemia and Sleep                  Apnea. Chronci kidney disease.  Sonographer:     Christopher Furnace Referring Phys:  [id number] SHERI HAMMOCK Diagnosing Phys: Lonni Hanson MD  Sonographer Comments: Technically challenging study due to limited acoustic windows. Image acquisition challenging due to COPD. IMPRESSIONS  1. Left ventricular ejection fraction, by estimation, is >55%. The left ventricle has normal function. Left ventricular endocardial border not optimally defined to evaluate regional wall motion. There is moderate left ventricular hypertrophy. Left ventricular diastolic parameters are consistent with Grade I diastolic dysfunction (impaired relaxation).  2. Right ventricular systolic function is moderately reduced. The right ventricular size is normal.  Moderately increased right ventricular wall thickness.  3. The mitral valve was not well visualized. No evidence of mitral valve regurgitation. No evidence of mitral stenosis.  4. The aortic valve was not well visualized. Aortic valve regurgitation is not visualized. No aortic stenosis is present. FINDINGS  Left Ventricle: Left ventricular ejection fraction, by estimation, is >55%. The left ventricle has normal function. Left ventricular endocardial border not optimally defined to evaluate regional wall motion. The left ventricular internal cavity size was  normal in size. There is moderate left ventricular hypertrophy. Left ventricular diastolic parameters are consistent with Grade I diastolic  dysfunction (impaired relaxation). Right Ventricle: The right ventricular size is normal. Moderately increased right ventricular wall thickness. Right ventricular systolic function is moderately reduced. Left Atrium: Left atrial size was normal in size. Right Atrium: Right atrial size was normal in size. Pericardium: Trivial pericardial effusion is present. Presence of epicardial fat layer. Mitral Valve: The mitral valve was not well visualized. No evidence of mitral valve regurgitation. No evidence of mitral valve stenosis. MV peak gradient, 4.4 mmHg. The mean mitral valve gradient is 2.0 mmHg. Tricuspid Valve: The tricuspid valve is not well visualized. Tricuspid valve regurgitation is trivial. Aortic Valve: The aortic valve was not well visualized. Aortic valve regurgitation is not visualized. No aortic stenosis is present. Aortic valve mean gradient measures 2.0 mmHg. Aortic valve peak gradient measures 3.1 mmHg. Aortic valve area, by VTI measures 3.42 cm. Pulmonic Valve: The pulmonic valve was not well visualized. Pulmonic valve regurgitation is not visualized. No evidence of pulmonic stenosis. Aorta: The aortic root is normal in size and structure. Pulmonary Artery: The pulmonary artery is not well seen. IAS/Shunts: The interatrial septum was not well visualized.  LEFT VENTRICLE PLAX 2D LVIDd:         2.73 cm   Diastology LVIDs:         1.12 cm   LV e' medial:    3.92 cm/s LV PW:         1.59 cm   LV E/e' medial:  13.4 LV IVS:        1.56 cm   LV e' lateral:   8.38 cm/s LVOT diam:     2.20 cm   LV E/e' lateral: 6.3 LV SV:         54 LV SV Index:   28 LVOT Area:     3.80 cm  RIGHT VENTRICLE RV Basal diam:  2.70 cm RV Mid diam:    2.60 cm RV S prime:     8.38 cm/s TAPSE (M-mode): 1.4 cm LEFT ATRIUM             Index        RIGHT ATRIUM           Index LA diam:        4.50 cm 2.30 cm/m   RA Area:     11.70 cm LA Vol (A2C):   25.6 ml 13.06 ml/m  RA Volume:   23.50 ml  12.01 ml/m LA Vol (A4C):   24.0 ml 12.27  ml/m LA Biplane Vol: 27.7 ml 14.16 ml/m  AORTIC VALVE AV Area (Vmax):    3.32 cm AV Area (Vmean):   3.33 cm AV Area (VTI):     3.42 cm AV Vmax:           87.50 cm/s AV Vmean:          59.800 cm/s AV VTI:  0.159 m AV Peak Grad:      3.1 mmHg AV Mean Grad:      2.0 mmHg LVOT Vmax:         76.40 cm/s LVOT Vmean:        52.400 cm/s LVOT VTI:          0.143 m LVOT/AV VTI ratio: 0.90  AORTA Ao Root diam: 3.10 cm MITRAL VALVE               TRICUSPID VALVE MV Area (PHT): 2.39 cm    TR Peak grad:   12.2 mmHg MV Area VTI:   2.92 cm    TR Vmax:        175.00 cm/s MV Peak grad:  4.4 mmHg MV Mean grad:  2.0 mmHg    SHUNTS MV Vmax:       1.05 m/s    Systemic VTI:  0.14 m MV Vmean:      63.2 cm/s   Systemic Diam: 2.20 cm MV Decel Time: 317 msec MV E velocity: 52.70 cm/s MV A velocity: 66.40 cm/s MV E/A ratio:  0.79 Lonni End MD Electronically signed by Lonni Hanson MD Signature Date/Time: 12/25/2023/12:39:30 PM    Final        Time spent: 50 min     Laneta Blunt, DO [facility] 12/27/2023, 6:53 PM    Dictation software may have been used to generate the above note. Typos may occur and escape review in typed/dictated notes. Please contact Dr Blunt directly for clarity if needed.  Staff may message me via secure chat in Epic  but this may not receive an immediate response,  please page me for urgent matters!  If 7PM-7AM, please contact night coverage [email or web address]

## 2023-12-27 NOTE — Plan of Care (Signed)

## 2023-12-27 NOTE — Telephone Encounter (Signed)
 Patient Product/process Development Scientist completed.    The patient is insured through [employer]. Patient has Medicare and is not eligible for a copay card, but may be able to apply for patient assistance or Medicare RX Payment Plan (Patient Must reach out to their plan, if eligible for payment plan), if available.    Ran test claim for Eliquis  5 mg and the current 30 day co-pay is $116.06.  Ran test claim for Xarelto 20 mg and the current 30 day co-pay is $114.48.   This test claim was processed through [facility]- copay amounts may vary at other pharmacies due to pharmacy/plan contracts, or as the patient moves through the different stages of their insurance plan.     Reyes Sharps, CPHT Pharmacy Technician Patient Advocate Specialist Lead [facility] Patient Advocate Team Direct Number: [phone]  Fax: [phone]

## 2023-12-28 ENCOUNTER — Other Ambulatory Visit: Payer: Self-pay

## 2023-12-28 ENCOUNTER — Encounter: Payer: Self-pay | Admitting: Cardiology

## 2023-12-28 ENCOUNTER — Encounter: Payer: Self-pay | Admitting: Oncology

## 2023-12-28 DIAGNOSIS — I471 Supraventricular tachycardia, unspecified: Secondary | ICD-10-CM | POA: Diagnosis not present

## 2023-12-28 DIAGNOSIS — Z9861 Coronary angioplasty status: Secondary | ICD-10-CM | POA: Diagnosis not present

## 2023-12-28 DIAGNOSIS — I2510 Atherosclerotic heart disease of native coronary artery without angina pectoris: Secondary | ICD-10-CM | POA: Diagnosis not present

## 2023-12-28 DIAGNOSIS — I2699 Other pulmonary embolism without acute cor pulmonale: Secondary | ICD-10-CM | POA: Diagnosis not present

## 2023-12-28 LAB — CBC
HCT: 33.4 % — ABNORMAL LOW (ref 36.0–46.0)
Hemoglobin: 10.9 g/dL — ABNORMAL LOW (ref 12.0–15.0)
MCH: 33.4 pg (ref 26.0–34.0)
MCHC: 32.6 g/dL (ref 30.0–36.0)
MCV: 102.5 fL — ABNORMAL HIGH (ref 80.0–100.0)
Platelets: 258 K/uL (ref 150–400)
RBC: 3.26 MIL/uL — ABNORMAL LOW (ref 3.87–5.11)
RDW: 14.9 % (ref 11.5–15.5)
WBC: 7.6 K/uL (ref 4.0–10.5)
nRBC: 0 % (ref 0.0–0.2)

## 2023-12-28 LAB — HEPARIN LEVEL (UNFRACTIONATED): Heparin Unfractionated: 0.31 [IU]/mL (ref 0.30–0.70)

## 2023-12-28 MED ORDER — EZETIMIBE 10 MG PO TABS
10.0000 mg | ORAL_TABLET | Freq: Every day | ORAL | 0 refills | Status: DC
  Filled 2023-12-28: qty 30, 30d supply, fill #0

## 2023-12-28 MED ORDER — DICLOFENAC SODIUM 1 % EX GEL
2.0000 g | Freq: Four times a day (QID) | CUTANEOUS | 0 refills | Status: AC | PRN
  Filled 2023-12-28: qty 100, 12d supply, fill #0

## 2023-12-28 MED ORDER — FLUTICASONE FUROATE-VILANTEROL 100-25 MCG/ACT IN AEPB
1.0000 | INHALATION_SPRAY | Freq: Every day | RESPIRATORY_TRACT | 0 refills | Status: DC
  Filled 2023-12-28: qty 60, 30d supply, fill #0

## 2023-12-28 MED ORDER — DILTIAZEM HCL ER COATED BEADS 240 MG PO CP24
240.0000 mg | ORAL_CAPSULE | Freq: Every day | ORAL | 0 refills | Status: DC
  Filled 2023-12-28: qty 30, 30d supply, fill #0

## 2023-12-28 MED ORDER — APIXABAN 5 MG PO TABS
10.0000 mg | ORAL_TABLET | Freq: Once | ORAL | Status: AC
  Administered 2023-12-28: 10 mg via ORAL
  Filled 2023-12-28: qty 2

## 2023-12-28 MED ORDER — APIXABAN 5 MG PO TABS
ORAL_TABLET | ORAL | 0 refills | Status: AC
  Filled 2023-12-28: qty 60, 23d supply, fill #0

## 2023-12-28 MED ORDER — ROSUVASTATIN CALCIUM 5 MG PO TABS
5.0000 mg | ORAL_TABLET | Freq: Every day | ORAL | 0 refills | Status: DC
  Filled 2023-12-28: qty 30, 30d supply, fill #0

## 2023-12-28 MED ORDER — METOPROLOL TARTRATE 25 MG PO TABS
25.0000 mg | ORAL_TABLET | Freq: Two times a day (BID) | ORAL | 0 refills | Status: DC
  Filled 2023-12-28: qty 60, 30d supply, fill #0

## 2023-12-28 MED ADMIN — Metoprolol Tartrate Tab 25 MG: 25 mg | ORAL | NDC 51079025501

## 2023-12-28 NOTE — Discharge Summary (Signed)
 Physician Discharge Summary   Patient: Courtney Wilcox MRN: [id number]  DOB: 27-Feb-1945   Admit:     Date of Admission: 12/23/2023 Admitted from: home   Discharge: Date of discharge: 12/28/2023 Disposition: Home health Condition at discharge: fair  CODE STATUS: FULL CODE     Discharge Physician: Laneta Blunt, DO [facility]     PCP: Marikay Eva POUR, PA  Recommendations for Outpatient Follow-up:  Follow up with PCP Marikay Eva POUR, PA in 2-4 weeks Follow as directed w/ cardiology and pulmonology    Discharge Instructions     Diet - low sodium heart healthy   Complete by: As directed    Increase activity slowly   Complete by: As directed    No wound care   Complete by: As directed          Discharge Diagnoses: Principal Problem:   Pulmonary embolus (HCC) Active Problems:   Nausea and vomiting   Rheumatoid arthritis (HCC)   CAD S/P percutaneous coronary angioplasty   Contusion of chest wall, left, subsequent encounter   Chronic venous insufficiency   PSVT (paroxysmal supraventricular tachycardia)   History of pulmonary embolism   COPD (chronic obstructive pulmonary disease) (HCC)   HTN (hypertension)   Chronic kidney disease, stage 3a [facility])       Hospital Course: Hospital course / significant events:   HPI: Courtney Wilcox is a 78 y.o. female with medical history significant for HTN, CAD s/p stent (09/2022), rheumatoid arthritis on chronic immunosuppressants and prednisone , CKD llla, SVT, COPD, OSA, prior recurrent PE (off Eliquis  x 1 year due to being on DAPT). Presented to ED w/ N/V unable to tolerate po, generalized weakness. Also noting upcoming appt for cardiac cath to rule out pulmonary HTN and has been increasingly SOB over last few weeks. SOB overall worse since hernia surgery 09/2023  Of note, recently in ED 12/06 dx w/ concern for L rib fx probably ater fall a week prior. The fall was described as mechanical,  patient was walking to her recliner, feeling somewhat out of breath, and bumped into the furniture causing her to fall over. CT this admission no evidence of rib fx   12/07: CTA PE showed a right sided central and segmental pulmonary emboli without heart strain and trace left pleural effusion. Started on heparin  infusion. Admitted to hosptialist  12/08: Pt lethargic AM following ativan  + phenergan . Cardiology consult - echo pending, consider proceed w/ R/L heart cath this hospitalization. GI consult for persistent nausea - recs continue acid suppression and antiemeditcs, cause likely acute illness +/- GERD/PUD, daughter requests EGD and Gi will consider pending cardiology recs / clearance for anesthesia.  12/09: Echo shows moderate RV df, mech thrombectomy risk > benefit per cardiology, consider hematology consult, continue heparin , possible cath later this week, also increased diltiazem  d/t asymptomatic SVT. Nausea resolved - GI s/o.  12/10: pulmonology consult - maintain 2L O2 wean or can go home w/ O2 if needed, ok for Eliquis  on discharge, DVT US  minimal DVT burden bilaterally. Started Breo for asthma/COPD overlap, encourage incentive spirometer use. Cardio - deferring cath for now. Pt/daughter request further discussion on this, I asked [facility] team to discuss concerns w/ patient/family 12/11: d/w Vasc does not need IVC. PSVT noted on tele, added metoprolol . Cath demonstrated no acute concerns - relative stable 2-vessel CAD w/ patent LAD stent, hemodynamics WNL and no apparent Pulm HTN   12/12: PT/OT recs home health, O2 sat ok on ambulation on  room air. Cardiology and pulmonary also have no concerns for discharge today .       Consultants:  Cardiology Gastroenterology Pulmonology   Procedures/Surgeries: Cardiac cath 12/27/23:  * Two-vessel CAD: Widely patent ostial and proximal LAD stent with minimal ISR (appears fully expanded); Progression of proximal RCA lesion from 30% to roughly  55% but still not flow-limiting *Essentially normal Hemodynamics with no evidence of pulmonary pretension      ASSESSMENT & PLAN:   Acute pulmonary embolism, recurrent  History of prior PE 11/2020 Prior to that PE, hx unprovoked DVT, recs for lifelong anticoag but was off this while on DAPT s/p coronary stent 09/2022 Small bilateral DVT -Patient  dyspneic tachycardic and tachypneic in the setting of being off AC x 1 year to decrease bleeding risk with concomitant DAPT -CTA chest on admission showing central and segmental right-sided PE with trace left pleural effusion heparin  gtt --> ELiquis  Curbside d/w vascular - no need for IVC at this time given hasn't failed anticoag and low clot burden.    PSVT Noted on telemetry 12/27/2023 Normal sinus rhythm with episodes of atrial tachycardia through the course of the day diltiazem  ER 240 daily metoprolol  tartrate 25 twice daily  Dyspnea --worked up by outpatient cardio Dr End and pulm Dr. Aleskerov --suspect multifactorial, including asthma/COPD, PE (though small so not likely a major contributor), CAD though do not suspect actively ischemic, possibly pulmonary HTN, certainly debility and poor reserves w/ recent illnesses/injuries and obesity are also all putting her at a disadvantage and reducing capacity.   Follow outpatient    Asthma/COPD overlap Follow w/ pulmonary  Breo ellipta    Contusion of chest wall, left, subsequent encounter History of fall off couch hitting left chest and abdomen no rib fracture noted on CT  Norco PRN diclofenac  gel TID   CAD S/P percutaneous coronary angioplasty stent LAD 09/2022 Stable angina  Has been on DAPT since stent 09/2022 --consult cardiology for decision on resumption of antiplatelet of choice Cardiology following  Statin intolerant- cont Zetia  ad trial low dose statin per cardiology Planon eliquis  - no ASA/Plavix  on this    Rheumatoid arthritis  On chronic immunosuppressive  therapy Will hold meds while in the house Arava  and Plaquenil  resume on discharge    Nausea and vomiting - improved 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 Current CT abdomen and pelvis unrevealing GI has s/o, no plan for EGD during this hospitalization, reengage as needed N/V improved, continue antacid and prn antiemetics .    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  Underweight - under 18  overweight - 25 to 29 obese - 30 or more Class 1 obesity: BMI of 30.0 to 34 Class 2 obesity: BMI of 35.0 to 39 Class 3 obesity: BMI of 40.0 to 49 Super Morbid Obesity: BMI 50-59 Super-super Morbid Obesity: BMI 60+ Healthy nutrition and physical activity advised as adjunct  to other disease management and risk reduction treatments            Discharge Instructions  Allergies as of 12/28/2023       Reactions   Amiodarone  Other (See Comments)   Fatigue, Nausea, anorexia, malaise   Lactose Other (See Comments), Diarrhea   Other Other (See Comments)   Pt ONLY tolerates SLOW-FE (slow released iron )---upsets IBS   Biaxin [clarithromycin] Other (See Comments)   GI upset   Hydrocodone -acetaminophen  Itching   Tolerates acetaminophen     Oxycodone  Itching   Sulfa Antibiotics Diarrhea, Other (See Comments)   GI upset   Sulfasalazine Diarrhea         Medication List     STOP taking these medications    aspirin  EC 81 MG tablet   budesonide -formoterol  160-4.5 MCG/ACT inhaler Commonly known as: SYMBICORT  Replaced by: Breo Ellipta  100-25 MCG/ACT Aepb   clopidogrel  75 MG tablet Commonly known as: PLAVIX    dexamethasone  4 MG tablet Commonly known as: DECADRON    doxycycline  100 MG tablet Commonly known as: VIBRA -TABS       TAKE these medications    acetaminophen  500 MG tablet Commonly known as: TYLENOL  Take 1,000 mg by mouth 3 (three) times daily as needed for mild pain.   albuterol  108 (90 Base) MCG/ACT inhaler Commonly known as: VENTOLIN  HFA Inhale 1-2 puffs into the lungs every 6 (six) hours as needed for wheezing or shortness of breath.   Breo Ellipta  100-25 MCG/ACT Aepb Generic drug: fluticasone  furoate-vilanterol Inhale 1 puff into the lungs daily. Start taking on: December 29, 2023 Replaces: budesonide -formoterol  160-4.5 MCG/ACT inhaler   Cyanocobalamin 1000 MCG/ML Kit Inject as directed every 30 (thirty) days.   diclofenac  Sodium 1 % Gel Commonly known as: VOLTAREN  Apply 2 g topically 4 (four) times daily as needed (joint/muscle ache).   diltiazem  240 MG 24 hr capsule Commonly known as: CARDIZEM  CD Take 1 capsule (240 mg total) by mouth daily. Start taking on: December 29, 2023 What changed:  medication strength how much to take when to take this   diphenhydrAMINE  25 mg capsule Commonly known as: BENADRYL  Take 25 mg by mouth every 6 (six) hours as needed for itching (Take with Hydrocoone/APAP for itching.).   Dupilumab 300 MG/2ML Soaj Inject 4 mLs into the skin every 14 (fourteen) days.   Eliquis  5 MG Tabs tablet Generic drug: apixaban  Take 2 tablets (10mg ) twice daily for 7 days, then 1 tablet (5mg ) twice daily   EPINEPHrine  0.3 mg/0.3 mL Soaj injection Commonly known as: EPI-PEN Inject 0.3 mg into the muscle as needed.   ezetimibe  10 MG tablet Commonly known as: ZETIA  Take 1 tablet (10  mg total) by mouth daily. Start taking on: December 29, 2023   famotidine  20 MG tablet Commonly known as: PEPCID  Take 20 mg by mouth daily as needed.   gabapentin  300 MG capsule Commonly known as: NEURONTIN  Take 300 mg by mouth at bedtime as needed. What changed: Another medication with the same name was removed. Continue taking this medication, and follow the directions you see here.   HYDROcodone -acetaminophen  5-325 MG tablet Commonly known as: NORCO/VICODIN Take 1 tablet by mouth every 4 (four) hours as needed.   ipratropium 0.03 % nasal spray Commonly known as: ATROVENT Place 1-2 sprays into both nostrils daily.   levalbuterol  0.63 MG/3ML nebulizer solution Commonly known as: XOPENEX  Take 0.63 mg by nebulization every 6 (six) hours as needed for wheezing.   meclizine 12.5 MG tablet Commonly known as: ANTIVERT Take 12.5  mg by mouth daily as needed for dizziness or nausea.   metoprolol  tartrate 25 MG tablet Commonly known as: LOPRESSOR  Take 1 tablet (25 mg total) by mouth 2 (two) times daily.   montelukast  10 MG tablet Commonly known as: SINGULAIR  Take 10 mg by mouth daily.   ondansetron  4 MG disintegrating tablet Commonly known as: ZOFRAN -ODT Take 1 tablet (4 mg total) by mouth every 8 (eight) hours as needed for nausea or vomiting.   pantoprazole  40 MG tablet Commonly known as: PROTONIX  Take 40 mg by mouth daily.   PRESERVISION AREDS 2 PO Take 2 tablets by mouth daily. Sometimes takes 2 at the same time   rosuvastatin  5 MG tablet Commonly known as: CRESTOR  Take 1 tablet (5 mg total) by mouth daily. Start taking on: December 29, 2023   sertraline  50 MG tablet Commonly known as: ZOLOFT  Take 50 mg by mouth daily.   SIMETHICONE  PO Take 250 mg by mouth as needed.   triamcinolone  55 MCG/ACT Aero nasal inhaler Commonly known as: NASACORT  Place 1 spray into the nose daily.          Allergies[1]   Subjective: pt reports feeling winded about her  baseline, no SOB at rest, no chest pain, no other complaints/concerns at this time    Discharge Exam: BP (!) 139/99   Pulse 67   Temp 98.1 F (36.7 C)   Resp 19   Ht 5' 4 (1.626 m)   Wt 90.7 kg   SpO2 96%   BMI 34.32 kg/m  General: Pt is alert, awake, not in acute distress Cardiovascular: RRR, S1/S2 +, no rubs, no gallops Respiratory: CTA bilaterally except faint rales / atelectasis at bases  Abdominal: Soft, NT, ND, bowel sounds + Extremities: no edema, no cyanosis     The results of significant diagnostics from this hospitalization (including imaging, microbiology, ancillary and laboratory) are listed below for reference.     Microbiology: No results found for this or any previous visit (from the past 240 hours).   Labs: BNP (last 3 results) No results for input(s): BNP in the last 8760 hours. Basic Metabolic Panel: Recent Labs  Lab 12/22/23 1606 12/23/23 1719 12/25/23 1454 12/26/23 0528 12/27/23 0455 12/27/23 1655 12/27/23 1703 12/27/23 1704  NA 132* 131* 126* 135 140 138 135 134*  K 4.2 4.5 4.1 3.7 3.5 3.5 3.4* 3.3*  CL 95* 93* 94* 99 103  --   --   --   CO2 26 27 18* 24 25  --   --   --   GLUCOSE 117* 93 79 81 92  --   --   --   BUN 17 14 11 11 10   --   --   --   CREATININE 1.41* 1.20* 1.41* 1.37* 1.40*  --   --   --   CALCIUM  8.7* 9.0 8.7* 8.6* 8.8*  --   --   --   MG  --   --   --  1.9  --   --   --   --    Liver Function Tests: Recent Labs  Lab 12/22/23 1606 12/23/23 1719  AST 25 28  ALT 24 25  ALKPHOS 88 91  BILITOT 0.7 0.7  PROT 5.8* 6.1*  ALBUMIN 3.6 3.8   No results for input(s): LIPASE, AMYLASE in the last 168 hours. No results for input(s): AMMONIA in the last 168 hours. CBC: Recent Labs  Lab 12/24/23 0520 12/25/23 0352 12/26/23 0528 12/27/23 0455 12/27/23 1655  12/27/23 1703 12/27/23 1704 12/28/23 0421  WBC 6.7 6.9 6.7 7.2  --   --   --  7.6  HGB 11.1* 11.2* 11.1* 11.3* 10.9* 10.9* 10.5* 10.9*  HCT 33.3* 33.7*  32.1* 34.4* 32.0* 32.0* 31.0* 33.4*  MCV 98.8 98.8 97.6 100.9*  --   --   --  102.5*  PLT 147* 176 213 247  --   --   --  258   Cardiac Enzymes: No results for input(s): CKTOTAL, CKMB, CKMBINDEX, TROPONINI in the last 168 hours. BNP: Invalid input(s): POCBNP CBG: No results for input(s): GLUCAP in the last 168 hours. D-Dimer No results for input(s): DDIMER in the last 72 hours. Hgb A1c No results for input(s): HGBA1C in the last 72 hours. Lipid Profile No results for input(s): CHOL, HDL, LDLCALC, TRIG, CHOLHDL, LDLDIRECT in the last 72 hours. Thyroid  function studies No results for input(s): TSH, T4TOTAL, T3FREE, THYROIDAB in the last 72 hours.  Invalid input(s): FREET3 Anemia work up No results for input(s): VITAMINB12, FOLATE, FERRITIN, TIBC, IRON , RETICCTPCT in the last 72 hours. Urinalysis    Component Value Date/Time   COLORURINE YELLOW (A) 06/13/2023 2358   APPEARANCEUR CLEAR (A) 06/13/2023 2358   LABSPEC 1.042 (H) 06/13/2023 2358   PHURINE 5.0 06/13/2023 2358   GLUCOSEU NEGATIVE 06/13/2023 2358   HGBUR NEGATIVE 06/13/2023 2358   BILIRUBINUR NEGATIVE 06/13/2023 2358   KETONESUR NEGATIVE 06/13/2023 2358   PROTEINUR NEGATIVE 06/13/2023 2358   NITRITE NEGATIVE 06/13/2023 2358   LEUKOCYTESUR TRACE (A) 06/13/2023 2358   Sepsis Labs Recent Labs  Lab 12/25/23 0352 12/26/23 0528 12/27/23 0455 12/28/23 0421  WBC 6.9 6.7 7.2 7.6   Microbiology No results found for this or any previous visit (from the past 240 hours). Imaging US  Venous Img Lower Bilateral (DVT) Result Date: 12/26/2023 CLINICAL DATA:  201733 DVT (deep venous thrombosis) (HCC) 201733, small acute pulmonary emboli 12/23/2023 EXAM: BILATERAL LOWER EXTREMITY VENOUS DOPPLER ULTRASOUND TECHNIQUE: Gray-scale sonography with graded compression, as well as color Doppler and duplex ultrasound were performed to evaluate the lower extremity deep venous systems from  the level of the common femoral vein and including the common femoral, femoral, profunda femoral, popliteal and calf veins including the posterior tibial, peroneal and gastrocnemius veins when visible. Spectral Doppler was utilized to evaluate flow at rest and with distal augmentation maneuvers in the common femoral, femoral and popliteal veins. COMPARISON:  None Available. FINDINGS: RIGHT LOWER EXTREMITY Common Femoral Vein: No evidence of thrombus. Normal compressibility, respiratory phasicity and response to augmentation. Saphenofemoral Junction: No evidence of thrombus. Normal compressibility and flow on color Doppler imaging. Profunda Femoral Vein: No evidence of thrombus. Normal compressibility and flow on color Doppler imaging. Femoral Vein: No evidence of thrombus. Normal compressibility, respiratory phasicity and response to augmentation. Popliteal Vein: No evidence of thrombus. Normal compressibility, respiratory phasicity and response to augmentation. Calf Veins: Posterior tibial and peroneal calf veins appear patent and compressible. Right gastrocnemius veins are noted to be distended with intraluminal thrombus and noncompressible compatible with DVT. Overall very minimal thrombus burden. LEFT LOWER EXTREMITY Common Femoral Vein: No evidence of thrombus. Normal compressibility, respiratory phasicity and response to augmentation. Saphenofemoral Junction: No evidence of thrombus. Normal compressibility and flow on color Doppler imaging. Profunda Femoral Vein: No evidence of thrombus. Normal compressibility and flow on color Doppler imaging. Femoral Vein: No evidence of thrombus. Normal compressibility, respiratory phasicity and response to augmentation. Popliteal Vein: No evidence of thrombus. Normal compressibility, respiratory phasicity and response to augmentation. Calf Veins:  Left posterior tibial and peroneal veins are patent and compressible. left gastrocnemius veins are also noted to be  noncompressible with some degree of hypoechoic intraluminal thrombus. Overall very minimal thrombus burden. IMPRESSION: Bilateral gastrocnemius calf DVT. Overall very low thrombus burden. Electronically Signed   By: CHRISTELLA.  Shick M.D.   On: 12/26/2023 13:00   ECHOCARDIOGRAM COMPLETE Result Date: 12/25/2023    ECHOCARDIOGRAM REPORT   Patient Name:   ALBERT DEVAUL Date of Exam: 12/25/2023 Medical Rec #:  [id number]         Height:       64.0 in Accession #:    [id number]        Weight:       200.0 lb Date of Birth:  10/06/1945         BSA:          1.956 m Patient Age:    78 years          BP:           152/138 mmHg Patient Gender: F                 HR:           70 bpm. Exam Location:  [facility] Procedure: 2D Echo, Cardiac Doppler and Color Doppler (Both Spectral and Color            Flow Doppler were utilized during procedure). Indications:     Dyspnea R06.00                  Pulmonary embolus I26.09  History:         Patient has prior history of Echocardiogram examinations, most                  recent 04/26/2023. COPD; Risk Factors:Dyslipidemia and Sleep                  Apnea. Chronci kidney disease.  Sonographer:     Christopher Furnace Referring Phys:  [id number] SHERI HAMMOCK Diagnosing Phys: Lonni Hanson MD  Sonographer Comments: Technically challenging study due to limited acoustic windows. Image acquisition challenging due to COPD. IMPRESSIONS  1. Left ventricular ejection fraction, by estimation, is >55%. The left ventricle has normal function. Left ventricular endocardial border not optimally defined to evaluate regional wall motion. There is moderate left ventricular hypertrophy. Left ventricular diastolic parameters are consistent with Grade I diastolic dysfunction (impaired relaxation).  2. Right ventricular systolic function is moderately reduced. The right ventricular size is normal. Moderately increased right ventricular wall thickness.  3. The mitral valve was not well visualized. No evidence of mitral valve  regurgitation. No evidence of mitral stenosis.  4. The aortic valve was not well visualized. Aortic valve regurgitation is not visualized. No aortic stenosis is present. FINDINGS  Left Ventricle: Left ventricular ejection fraction, by estimation, is >55%. The left ventricle has normal function. Left ventricular endocardial border not optimally defined to evaluate regional wall motion. The left ventricular internal cavity size was  normal in size. There is moderate left ventricular hypertrophy. Left ventricular diastolic parameters are consistent with Grade I diastolic dysfunction (impaired relaxation). Right Ventricle: The right ventricular size is normal. Moderately increased right ventricular wall thickness. Right ventricular systolic function is moderately reduced. Left Atrium: Left atrial size was normal in size. Right Atrium: Right atrial size was normal in size. Pericardium: Trivial pericardial effusion is present. Presence of epicardial fat layer. Mitral Valve: The mitral valve was not well visualized. No  evidence of mitral valve regurgitation. No evidence of mitral valve stenosis. MV peak gradient, 4.4 mmHg. The mean mitral valve gradient is 2.0 mmHg. Tricuspid Valve: The tricuspid valve is not well visualized. Tricuspid valve regurgitation is trivial. Aortic Valve: The aortic valve was not well visualized. Aortic valve regurgitation is not visualized. No aortic stenosis is present. Aortic valve mean gradient measures 2.0 mmHg. Aortic valve peak gradient measures 3.1 mmHg. Aortic valve area, by VTI measures 3.42 cm. Pulmonic Valve: The pulmonic valve was not well visualized. Pulmonic valve regurgitation is not visualized. No evidence of pulmonic stenosis. Aorta: The aortic root is normal in size and structure. Pulmonary Artery: The pulmonary artery is not well seen. IAS/Shunts: The interatrial septum was not well visualized.  LEFT VENTRICLE PLAX 2D LVIDd:         2.73 cm   Diastology LVIDs:         1.12 cm    LV e' medial:    3.92 cm/s LV PW:         1.59 cm   LV E/e' medial:  13.4 LV IVS:        1.56 cm   LV e' lateral:   8.38 cm/s LVOT diam:     2.20 cm   LV E/e' lateral: 6.3 LV SV:         54 LV SV Index:   28 LVOT Area:     3.80 cm  RIGHT VENTRICLE RV Basal diam:  2.70 cm RV Mid diam:    2.60 cm RV S prime:     8.38 cm/s TAPSE (M-mode): 1.4 cm LEFT ATRIUM             Index        RIGHT ATRIUM           Index LA diam:        4.50 cm 2.30 cm/m   RA Area:     11.70 cm LA Vol (A2C):   25.6 ml 13.06 ml/m  RA Volume:   23.50 ml  12.01 ml/m LA Vol (A4C):   24.0 ml 12.27 ml/m LA Biplane Vol: 27.7 ml 14.16 ml/m  AORTIC VALVE AV Area (Vmax):    3.32 cm AV Area (Vmean):   3.33 cm AV Area (VTI):     3.42 cm AV Vmax:           87.50 cm/s AV Vmean:          59.800 cm/s AV VTI:            0.159 m AV Peak Grad:      3.1 mmHg AV Mean Grad:      2.0 mmHg LVOT Vmax:         76.40 cm/s LVOT Vmean:        52.400 cm/s LVOT VTI:          0.143 m LVOT/AV VTI ratio: 0.90  AORTA Ao Root diam: 3.10 cm MITRAL VALVE               TRICUSPID VALVE MV Area (PHT): 2.39 cm    TR Peak grad:   12.2 mmHg MV Area VTI:   2.92 cm    TR Vmax:        175.00 cm/s MV Peak grad:  4.4 mmHg MV Mean grad:  2.0 mmHg    SHUNTS MV Vmax:       1.05 m/s    Systemic VTI:  0.14 m MV Vmean:  63.2 cm/s   Systemic Diam: 2.20 cm MV Decel Time: 317 msec MV E velocity: 52.70 cm/s MV A velocity: 66.40 cm/s MV E/A ratio:  0.79 Christopher End MD Electronically signed by Lonni Hanson MD Signature Date/Time: 12/25/2023/12:39:30 PM    Final       Time coordinating discharge: over 30 minutes  SIGNED:  Laneta Blunt DO [facility]       [1]  Allergies Allergen Reactions   Amiodarone  Other (See Comments)    Fatigue, Nausea, anorexia, malaise   Lactose Other (See Comments) and Diarrhea   Other Other (See Comments)    Pt ONLY tolerates SLOW-FE (slow released iron )---upsets IBS   Biaxin [Clarithromycin] Other (See Comments)    GI  upset    Hydrocodone -Acetaminophen  Itching    Tolerates acetaminophen     Oxycodone  Itching   Sulfa Antibiotics Diarrhea and Other (See Comments)    GI upset   Sulfasalazine Diarrhea

## 2023-12-28 NOTE — Progress Notes (Signed)
 Please document actions taken and leave I-vent open until all actions completed.  AVS Documentation: 12/12  Co-pay check: $116.06  Patient education: 12/12

## 2023-12-28 NOTE — Evaluation (Signed)
 Physical Therapy Evaluation Patient Details Name: Courtney Wilcox MRN: [id number] DOB: 10/11/1945 Today's Date: 12/28/2023  History of Present Illness  Pt is a 78 y.o. female with medical history significant for HTN, CAD s/p stent (09/2022), rheumatoid arthritis on chronic immunosuppressants and prednisone , CKD llla, SVT, COPD, OSA, prior recurrent PE, right sided pulmonary embolism(central and segmental). MD assessment includes: acute pulm embolism recurrent, hx of prior PE, off anticoagulation x1 yr, cardiac cath placed 12/27/23.  Clinical Impression  Pt was sitting in chair on arrival, was pleasant and motivated to participate during the session and put forth good effort throughout. Pt did report being in some discomfort due to rib fx, and was using a heating pad while sitting in the chair. Pt educated on L radial cath precautions and being mindful to avoid UE push through to protect the site. Pt was able to STS with HHA +2 for safety, was steady on her feet and did not report any dizziness or adverse symptoms. Pt was found on 1.5L [place], MD cleared to trial on RA and SpO2 and HR were monitored at rest and with activity on RA --care team notified (see details below). Pt ambulated approx 25 with HHA +1, and navigated standing up from commode and hand washing with CGA, without any LOB or knee buckling. Pt will benefit from continued PT services upon discharge to safely address deficits listed in patient problem list for decreased caregiver assistance and eventual return to PLOF.       If plan is discharge home, recommend the following: A little help with walking and/or transfers;Assistance with cooking/housework;A little help with bathing/dressing/bathroom;Help with stairs or ramp for entrance;Assist for transportation   Can travel by private vehicle        Equipment Recommendations    Recommendations for Other Services       Functional Status Assessment Patient has had a recent decline in  their functional status and demonstrates the ability to make significant improvements in function in a reasonable and predictable amount of time.     Precautions / Restrictions Precautions L radial cath on 12/27/23 Precautions: Fall Recall of Precautions/Restrictions: Intact Restrictions Weight Bearing Restrictions Per Provider Order: No      Mobility  Bed Mobility               General bed mobility comments: NT    Transfers Overall transfer level: Needs assistance Equipment used: None Transfers: Sit to/from Stand Sit to Stand: Contact guard assist (HHA)           General transfer comment: CGA and HHA +2 for safety - good job reaching for arm rails with R arm being cautious with L arm and lowering self slowly to chair and commode    Ambulation/Gait Ambulation/Gait assistance: Contact guard assist Gait Distance (Feet): 25 Feet Assistive device: 1 person hand held assist Gait Pattern/deviations: Step-through pattern Gait velocity: decreased     General Gait Details: pt was able to ambulate approx 22ft with HHA, slow and steady, no LOB, no knee buckling  Stairs            Wheelchair Mobility     Tilt Bed    Modified Rankin (Stroke Patients Only)       Balance Overall balance assessment: Needs assistance Sitting-balance support: Feet supported, No upper extremity supported Sitting balance-Leahy Scale: Normal     Standing balance support: During functional activity, Single extremity supported Standing balance-Leahy Scale: Good  Pertinent Vitals/Pain Pain Assessment Pain Assessment: Faces Faces Pain Scale: Hurts a little bit Pain Location: ribcage Pain Descriptors / Indicators: Aching Pain Intervention(s): Monitored during session, Repositioned    Home Living Family/patient expects to be discharged to:: Private residence Living Arrangements: Children Available Help at Discharge: Family;Available 24  hours/day Type of Home: House Home Access: Stairs to enter Entrance Stairs-Rails: Right Entrance Stairs-Number of Steps: 1   Home Layout: One level Home Equipment: Rollator (4 wheels);Shower seat;Grab bars - tub/shower;Grab bars - toilet;Rolling Walker (2 wheels)      Prior Function Prior Level of Function : Independent/Modified Independent             Mobility Comments: PRN walker use ADLs Comments: PRN dressing/tub t/f assist     Extremity/Trunk Assessment   Upper Extremity Assessment Upper Extremity Assessment: Defer to OT evaluation    Lower Extremity Assessment Lower Extremity Assessment: Generalized weakness       Communication   Communication Communication: Impaired Factors Affecting Communication: Hearing impaired    Cognition Arousal: Alert Behavior During Therapy: WFL for tasks assessed/performed                             Following commands: Intact       Cueing Cueing Techniques: Verbal cues     General Comments General comments (skin integrity, edema, etc.): Pt did well on RA at 98% at rest and with activity, left on RA at 98%. HR WNL    Exercises Other Exercises Other Exercises: pt edu on role of PT in acute care, recs and benfits of mobiilty   Assessment/Plan    PT Assessment Patient needs continued PT services  PT Problem List Decreased strength;Decreased range of motion;Decreased activity tolerance;Decreased balance;Decreased mobility;Decreased knowledge of use of DME;Pain       PT Treatment Interventions Gait training;Stair training;Functional mobility training;Therapeutic activities;Therapeutic exercise;Balance training;Patient/family education;DME instruction    PT Goals (Current goals can be found in the Care Plan section)  Acute Rehab PT Goals Patient Stated Goal: get back home PT Goal Formulation: With patient Time For Goal Achievement: 01/10/24 Potential to Achieve Goals: Good    Frequency Min 2X/week      Co-evaluation               AM-PAC PT 6 Clicks Mobility  Outcome Measure Help needed turning from your back to your side while in a flat bed without using bedrails?: A Little Help needed moving from lying on your back to sitting on the side of a flat bed without using bedrails?: A Little Help needed moving to and from a bed to a chair (including a wheelchair)?: A Little Help needed standing up from a chair using your arms (e.g., wheelchair or bedside chair)?: A Little Help needed to walk in hospital room?: A Little Help needed climbing 3-5 steps with a railing? : A Lot 6 Click Score: 17    End of Session Equipment Utilized During Treatment: Gait belt Activity Tolerance: Patient tolerated treatment well Patient left: in chair;with call bell/phone within reach;with chair alarm set Nurse Communication: Mobility status pt on RA  PT Visit Diagnosis: Difficulty in walking, not elsewhere classified (R26.2);Muscle weakness (generalized) (M62.81);Pain Pain - Right/Left: Left Pain - part of body:  (back, abdomen, rib area)     Time: 8963-8898 PT Time Calculation (min) (ACUTE ONLY): 25 min   Charges:  Corean Newport, SPT 12/28/2023, 1:30 PM

## 2023-12-28 NOTE — Discharge Instructions (Signed)
 Information on my medicine - ELIQUIS  (apixaban )  This medication education was reviewed with me or my healthcare representative as part of my discharge preparation.  The pharmacist that spoke with me during my hospital stay was:    Why was Eliquis  prescribed for you? Eliquis  was prescribed to treat blood clots that may have been found in the veins of your legs (deep vein thrombosis) or in your lungs (pulmonary embolism) and to reduce the risk of them occurring again.  What do You need to know about Eliquis  ? The starting dose is 10 mg (two 5 mg tablets) taken TWICE daily for the FIRST SEVEN (7) DAYS, then on (enter date)  12/20  the dose is reduced to ONE 5 mg tablet taken TWICE daily.  Eliquis  may be taken with or without food.   Try to take the dose about the same time in the morning and in the evening. If you have difficulty swallowing the tablet whole please discuss with your pharmacist how to take the medication safely.  Take Eliquis  exactly as prescribed and DO NOT stop taking Eliquis  without talking to the doctor who prescribed the medication.  Stopping may increase your risk of developing a new blood clot.  Refill your prescription before you run out.  After discharge, you should have regular check-up appointments with your healthcare provider that is prescribing your Eliquis .    What do you do if you miss a dose? If a dose of ELIQUIS  is not taken at the scheduled time, take it as soon as possible on the same day and twice-daily administration should be resumed. The dose should not be doubled to make up for a missed dose.  Important Safety Information A possible side effect of Eliquis  is bleeding. You should call your healthcare provider right away if you experience any of the following: Bleeding from an injury or your nose that does not stop. Unusual colored urine (red or dark brown) or unusual colored stools (red or black). Unusual bruising for unknown reasons. A serious  fall or if you hit your head (even if there is no bleeding).  Some medicines may interact with Eliquis  and might increase your risk of bleeding or clotting while on Eliquis . To help avoid this, consult your healthcare provider or pharmacist prior to using any new prescription or non-prescription medications, including herbals, vitamins, non-steroidal anti-inflammatory drugs (NSAIDs) and supplements.  This website has more information on Eliquis  (apixaban ): [email or web address] dena

## 2023-12-28 NOTE — Progress Notes (Addendum)
 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  Previously had a pulmonary embolism and had Eliquis  but was taken off due to being on dual antiplatelet therapy and easy bruising.  She reports that she fell down approximately 4 days prior to admission and has been feeling chest discomfort with worsening dyspnea since then.  She had a CT angio PE protocol 1 month ago which was negative for PE.  On arrival to the ER she was tachypneic and tachycardic with cardiac biomarkers were normal and patient was not hypoxemic on ambient air.  She had a repeat CTA PE protocol showing multiple subsegmental emboli without right heart strain and a left pleural effusion which is small.  The emboli are mostly right-sided central and lobar pulmonary emboli.  Patient has been placed on heparin  drip and is doing well without any signs of bleeding.  PCCM consultation placed for additional evaluation management  12/27/23-  patient sitting up in bed in no distress.  I spoke with daughter also in hallway.  Patient is clinically improved, she did have EKG done today. We discussed multifactorial etiology of her chronic dyspnea including , rheumatoid arthritis precluding much activity, physical deconditioning, morbid obesity,  asthma with recurrent exacerbation, previous PE, current PE and advanced age. We discussed need for chronic anticoagulation.   PAST MEDICAL HISTORY   Past Medical History:  Diagnosis Date   Allergic rhinitis    Anxiety    Arthritis    Asthma    Atrophic vaginitis    B12 deficiency    Bronchitis, chronic (HCC)    CAD (coronary artery disease)    a. 03/2003 Cath: Nl cors; b. 09/2022 Staged PCI: LM nl, LAD 85p/62m (3.5x26 Onyx Frontier DES), RI small, mild dzs, LCX nl, OM2 mild dzs, RCA 30p.   Cervical disc disease    CKD (chronic kidney disease), stage III (HCC)    Collagen vascular disease    RA   COPD (chronic obstructive pulmonary disease) (HCC)    Cough - post-COVID    Depression    Diastolic dysfunction    a. 11/2020 Echo: EF 45-50%, GrI DD; b. 10/2021 Echo: EF 60-65%, GrII DD; c. 09/2022 Echo: EF 55-60%, no rwma, GrI DD, nl RV fxn, mildly dil LA. No significant valvular dzs; d. 04/2023 Echo: EF 60-65% no rwma, mild LVH, nl RV fxn, RVSP 23.27mmHg. AoV sclerosis.   Fibrocystic breast disease    Foot ulcer (HCC)    GERD (gastroesophageal reflux disease)    Hammer toe    Hyperlipidemia    IBS (irritable bowel syndrome)    Macrocytic anemia    Macular degeneration 2023   Nausea and vomiting 12/23/2023   Obesity    OSA (obstructive sleep apnea)    Osteoarthrosis  Multi site   Osteopenia    PSVT (paroxysmal supraventricular tachycardia)    a. 04/2022 Zio: Sinus rhythm @ 57 (42-101). Rare PACs/PVCs. 74290 SVT runs lasting up to 56m 12sec - max rate 222. 5 beats NSVT. Triggered events = sinus, SVT, PACs.   Rheumatoid arthritis (HCC)    Shingles    Thyroid  nodule    MULTIPLE   Tonsillitis    recurrent   Umbilical hernia      SURGICAL HISTORY   Past Surgical History:  Procedure Laterality Date   BACK SURGERY  2011   LAMINECTOMY   BREAST BIOPSY Right    neg   CERVICAL FUSION  2011, 2012   X 2   COLONOSCOPY  2005, 2015   COLONOSCOPY WITH PROPOFOL  N/A 08/04/2020    Procedure: COLONOSCOPY WITH PROPOFOL ;  Surgeon: Dessa Reyes ORN, MD;  Location: [facility];  Service: Endoscopy;  Laterality: N/A;   CORONARY STENT INTERVENTION N/A 10/02/2022   Procedure: CORONARY STENT INTERVENTION;  Surgeon: Darron Deatrice LABOR, MD;  Location: [facility];  Service: Cardiovascular;  Laterality: N/A;   ESOPHAGOGASTRODUODENOSCOPY (EGD) WITH PROPOFOL  N/A 09/26/2021   Procedure: ESOPHAGOGASTRODUODENOSCOPY (EGD) WITH PROPOFOL ;  Surgeon: Onita Elspeth Sharper, DO;  Location: [facility] ENDOSCOPY;  Service: Gastroenterology;  Laterality: N/A;   ESOPHAGOGASTRODUODENOSCOPY ENDOSCOPY  2005, 2013, 2015   EYE SURGERY Bilateral    Cataract Extraction with IOL    HERNIA REPAIR     JOINT REPLACEMENT     KNEE ARTHROPLASTY Left 07/12/2016   Procedure: COMPUTER ASSISTED TOTAL KNEE ARTHROPLASTY;  Surgeon: Mardee Lynwood SQUIBB, MD;  Location: [facility] OR[facility];  Service: Orthopedics;  Laterality: Left;   Laryngeal tear after intubation w/repair     LUMBAR DISC SURGERY  12/2009   REPLACEMENT TOTAL KNEE Right 06/2007   DR. HOOTEN, [facility]   REPLACEMENT UNICONDYLAR JOINT KNEE     DR. HOOTEN, [facility]   RIGHT/LEFT HEART CATH AND CORONARY ANGIOGRAPHY N/A 09/29/2022   Procedure: RIGHT/LEFT HEART CATH AND CORONARY ANGIOGRAPHY;  Surgeon: Darron Deatrice LABOR, MD;  Location: [facility];  Service: Cardiovascular;  Laterality: N/A;   RIGHT/LEFT HEART CATH AND CORONARY ANGIOGRAPHY N/A 12/27/2023   Procedure: RIGHT/LEFT HEART CATH AND CORONARY ANGIOGRAPHY;  Surgeon: Anner Alm ORN, MD;  Location: [facility];  Service: Cardiovascular;  Laterality: N/A;   SHOULDER ARTHROSCOPY WITH ROTATOR CUFF REPAIR Right    TONSILLECTOMY     TOTAL HIP ARTHROPLASTY Right 01/31/2017   Procedure: TOTAL HIP ARTHROPLASTY;  Surgeon: Mardee Lynwood SQUIBB, MD;  Location: [facility];  Service: Orthopedics;  Laterality: Right;   TUBAL LIGATION     UPPER GI ENDOSCOPY     VENTRAL HERNIA REPAIR N/A 09/04/2023   Procedure: REPAIR,  HERNIA, VENTRAL;  Surgeon: Marinda Jayson KIDD, MD;  Location: [facility] OR[facility];  Service: General;  Laterality: N/A;     FAMILY HISTORY   Family History  Problem Relation Age of Onset   Asthma Mother    Breast cancer Mother 50   Emphysema Father        smoked   Lung cancer Father        smoked   Coronary artery disease Father 71     SOCIAL HISTORY   Social History   Tobacco Use   Smoking status: Never   Smokeless tobacco: Never  Vaping Use   Vaping status: Never Used  Substance Use Topics   Alcohol use: No   Drug use: No     MEDICATIONS    Home Medication:  Current Medication:  Current Facility-Administered Medications:    0.9 %  sodium chloride  infusion, 250 mL, Intravenous, PRN, Anner Alm ORN, MD   acetaminophen  (TYLENOL ) tablet 650 mg, 650 mg, Oral, Q6H PRN, 650 mg at 12/28/23 0345 **OR** acetaminophen  (TYLENOL ) suppository 650 mg, 650 mg, Rectal, Q6H PRN, Anner Alm ORN, MD   aspirin  chewable tablet 81 mg, 81 mg, Oral, Daily, Anner Alm ORN, MD, 81 mg at 12/28/23 9096   diclofenac  Sodium (VOLTAREN ) 1 % topical gel 2 g, 2 g, Topical, TID, Anner Alm ORN, MD   diltiazem  (CARDIZEM  CD) 24 hr capsule 240 mg, 240 mg, Oral, Daily, Anner Alm ORN, MD, 240 mg at 12/28/23 9096   ezetimibe  (ZETIA ) tablet 10 mg, 10 mg, Oral, Daily, Anner Alm ORN, MD, 10 mg at 12/28/23 9095   famotidine  (PEPCID ) tablet 20 mg, 20 mg, Oral, Daily PRN, Anner Alm ORN, MD   fluticasone  furoate-vilanterol (BREO ELLIPTA ) 100-25 MCG/ACT 1 puff, 1 puff, Inhalation, Daily, Anner Alm ORN, MD, 1 puff at 12/28/23 9095   gabapentin  (NEURONTIN ) capsule 300 mg, 300 mg, Oral, QHS PRN, Anner Alm ORN, MD, 300 mg at 12/27/23 2204   heparin  ADULT infusion 100 units/mL (25000 units/275mL), 1,150 Units/hr, Intravenous, Continuous, Nazari, Walid A, RPH, Last Rate: 11.5 mL/hr at 12/28/23 0733, 1,150 Units/hr at 12/28/23 9266   HYDROcodone -acetaminophen  (NORCO/VICODIN) 5-325 MG per tablet 1-2 tablet,  1-2 tablet, Oral, Q4H PRN, Anner Alm ORN, MD, 1 tablet at 12/26/23 1152   ipratropium-albuterol  (DUONEB) 0.5-2.5 (3) MG/3ML nebulizer solution 3 mL, 3 mL, Nebulization, Q4H PRN, Anner Alm ORN, MD, 3 mL at 12/26/23 1246   LORazepam  (ATIVAN ) injection 1 mg, 1 mg, Intravenous, Q6H PRN, Anner Alm ORN, MD, 1 mg at 12/24/23 1327   metoprolol  tartrate (LOPRESSOR ) injection 5 mg, 5 mg, Intravenous, Q6H PRN, Anner Alm ORN, MD   metoprolol  tartrate (LOPRESSOR ) tablet 25 mg, 25 mg, Oral, BID, Anner Alm ORN, MD, 25 mg at 12/28/23 9095   ondansetron  (ZOFRAN ) tablet 4 mg, 4 mg, Oral, Q6H PRN, 4 mg at 12/27/23 2236 **OR** ondansetron  (ZOFRAN ) injection 4 mg, 4 mg, Intravenous, Q6H PRN, Anner Alm ORN, MD, 4 mg at 12/24/23 2211   Oral care mouth rinse, 15 mL, Mouth Rinse, PRN, Anner Alm ORN, MD   pantoprazole  (PROTONIX ) EC tablet 40 mg, 40 mg, Oral, QHS, Anner Alm ORN, MD, 40 mg at 12/27/23 2204   promethazine  (PHENERGAN ) 12.5 mg in sodium chloride  0.9 % 50 mL IVPB, 12.5 mg, Intravenous, Q6H PRN, Anner Alm ORN, MD, Last Rate: 150 mL/hr at 12/24/23 0414, 12.5 mg at 12/24/23 0414   rosuvastatin  (CRESTOR ) tablet 5 mg, 5 mg, Oral, Daily, Anner Alm ORN, MD, 5 mg at 12/28/23 9095   sertraline  (ZOLOFT ) tablet 50 mg, 50 mg, Oral, Daily, Anner Alm ORN, MD, 50 mg at 12/28/23 9095   sodium chloride  flush (NS) 0.9 % injection 3 mL, 3 mL, Intravenous, Q12H, Anner Alm ORN, MD, 3 mL at 12/27/23 2204   sodium chloride  flush (NS) 0.9 % injection 3 mL, 3 mL, Intravenous, PRN, Anner Alm ORN, MD    ALLERGIES   Amiodarone , Lactose, Other, Biaxin [clarithromycin], Hydrocodone -acetaminophen , Oxycodone , Sulfa antibiotics, and Sulfasalazine     REVIEW OF SYSTEMS    Review of Systems:  Gen:  Denies  fever, sweats, chills weigh loss  HEENT: Denies blurred vision, double vision, ear pain, eye pain, hearing loss, nose bleeds, sore throat Cardiac:  No dizziness, chest pain or heaviness, chest  tightness,edema Resp:   reports dyspnea chronically  Gi: Denies swallowing difficulty, stomach pain, nausea or vomiting, diarrhea, constipation, bowel incontinence Gu:  Denies bladder incontinence, burning urine Ext:   Denies Joint pain, stiffness or swelling Skin: Denies  skin rash, easy bruising or bleeding or hives Endoc:  Denies polyuria, polydipsia , polyphagia or weight change Psych:   Denies depression, insomnia or hallucinations   Other:  All other systems negative   VS: BP 118/62   Pulse 72   Temp 98.1 F (36.7 C)   Resp 18   Ht 5' 4 (1.626 m)   Wt 90.7 kg   SpO2 93%   BMI 34.32 kg/m      PHYSICAL EXAM    GENERAL:NAD, no fevers, chills, no weakness no fatigue HEAD: Normocephalic, atraumatic.  EYES: Pupils equal, round, reactive to light. Extraocular muscles intact. No scleral icterus.  MOUTH: Moist mucosal membrane. Dentition intact. No abscess noted.  EAR, NOSE, THROAT: Clear without exudates. No external lesions.  NECK: Supple. No thyromegaly. No nodules. No JVD.  PULMONARY: decreased breath sounds with mild rhonchi worse at bases bilaterally.  CARDIOVASCULAR: S1 and S2. Regular rate and rhythm. No murmurs, rubs, or gallops. No edema. Pedal pulses 2+ bilaterally.  GASTROINTESTINAL: Soft, nontender, nondistended. No masses. Positive bowel sounds. No hepatosplenomegaly.  MUSCULOSKELETAL: No swelling, clubbing, or edema. Range of motion full in all extremities.  NEUROLOGIC: Cranial nerves II through XII are intact. No gross focal neurological deficits. Sensation intact. Reflexes intact.  SKIN: No ulceration, lesions, rashes, or cyanosis. Skin warm and dry. Turgor intact.  PSYCHIATRIC: Mood, affect within normal limits. The patient is awake, alert and oriented x 3. Insight, judgment intact.       IMAGING  Study Result  Narrative & Impression  CLINICAL DATA:  Clemens yesterday, nausea and vomiting, weakness, increasing shortness of breath   EXAM: CT  ANGIOGRAPHY CHEST   CT ABDOMEN AND PELVIS WITH CONTRAST   TECHNIQUE: Multidetector CT imaging of the chest was performed using the standard protocol during bolus administration of intravenous contrast. Multiplanar CT image reconstructions and MIPs were obtained to evaluate the vascular anatomy. Multidetector CT imaging of the abdomen and pelvis was performed using the standard protocol during bolus administration of intravenous contrast.   RADIATION DOSE REDUCTION: This exam was performed according to the departmental dose-optimization program which includes automated exposure control, adjustment of the mA and/or kV according to patient size and/or use of iterative reconstruction technique.   CONTRAST:  OMNIPAQUE  IOHEXOL  350 MG/ML SOLN   COMPARISON:  12/23/2023, 11/21/2023, 09/04/2023   FINDINGS: CTA CHEST FINDINGS   Cardiovascular: This is a technically adequate evaluation of the pulmonary vasculature. Right-sided central and lobar pulmonary emboli are identified. No evidence of right heart strain.   Lipomatous hypertrophy of the interatrial septum is again noted. No pericardial effusion. Normal caliber of the thoracic aorta. Stable atherosclerosis of the aorta and coronary vasculature.   Mediastinum/Nodes: Stable appearance of the thyroid . Trachea and esophagus are unremarkable. No pathologic adenopathy.   Lungs/Pleura: Trace left pleural effusion. No acute airspace disease or pneumothorax. Dependent hypoventilatory changes are seen within the lower lobes.   Musculoskeletal: No acute or destructive bony abnormalities. Reconstructed images demonstrate no additional findings.   Review of the MIP images confirms the above findings.   CT ABDOMEN and PELVIS FINDINGS   Hepatobiliary: No focal liver abnormality is seen. No gallstones, gallbladder wall thickening, or biliary dilatation.   Pancreas: Unremarkable. No pancreatic ductal dilatation or surrounding  inflammatory changes.   Spleen: Normal in size without focal  abnormality.   Adrenals/Urinary Tract: Bilateral renal cortical atrophy again noted. No abnormal renal enhancement. No urinary tract calculi or obstructive uropathy. The adrenals are stable. Bladder is decompressed, limiting its evaluation.   Stomach/Bowel: No bowel obstruction or ileus. Normal appendix right lower quadrant. Sigmoid diverticulosis without evidence of acute diverticulitis. No bowel wall thickening or inflammatory change.   Vascular/Lymphatic: No significant vascular findings are present. No enlarged abdominal or pelvic lymph nodes.   Reproductive: Uterus and bilateral adnexa are unremarkable.   Other: No free fluid or free intraperitoneal gas. No abdominal wall hernia.   Musculoskeletal: Unremarkable right hip arthroplasty. There are no acute or destructive bony abnormalities. Reconstructed images demonstrate no additional findings.   Review of the MIP images confirms the above findings.   IMPRESSION: Chest:   1. Right-sided central and segmental pulmonary emboli. No right heart strain. 2. Trace left pleural effusion. 3. Aortic Atherosclerosis (ICD10-I70.0). Coronary artery atherosclerosis.   Abdomen/pelvis:   1. No acute intra-abdominal or intrapelvic process. 2. Sigmoid diverticulosis without diverticulitis.   Critical Value/emergent results were called by telephone at the time of interpretation on 12/23/2023 at 7:09 pm to provider PHILLIP STAFFORD , who verbally acknowledged these results.     Electronically Signed   By: Ozell Daring M.D.   On: 12/23/2023 19:12    ASSESSMENT/PLAN   Acute nonmassive pulmonary embolism    - Patient currently is in no distress and in general is with chronic dyspnea and chronic physical deconditioning with morbid obesity.   -She is currently on 2 L/min nasal cannula I think this could be weaned down but she may be able to go home on oxygen as well.   -Patient may be transition to p.o. Eliquis  and sent home with outpatient follow-up  - Recommend PT OT if DVT US  is clear.  If not she may need an IVC filter - No indication for pulmonary thrombectomy at this time  Asthma COPD overlap - Patient is currently on DuoNebs as needed Breo Ellipta  100/25 once daily as ordered   Rheumatoid arthritis Patient takes Arava  and Plaquenil    Bibasilar atelectasis with obesity hypoventilation -Patient would benefit from using incentive spirometry - I have discussed incentive spirometry and chest physiotherapy as well as encouraged patient to use this device multiple times each hour while inpatient   Thank you for allowing me to participate in the care of this patient.   Patient/Family are satisfied with care plan and all questions have been answered.    Provider disclosure: Patient with at least one acute or chronic illness or injury that poses a threat to life or bodily function and is being managed actively during this encounter.  All of the below services have been performed independently by signing provider:  review of prior documentation from internal and or external health records.  Review of previous and current lab results.  Interview and comprehensive assessment during patient visit today. Review of current and previous chest radiographs/CT scans. Discussion of management and test interpretation with health care team and patient/family.   This document was prepared using Dragon voice recognition software and may include unintentional dictation errors.     Jayion Schneck, M.D.  Division of Pulmonary & Critical Care Medicine

## 2023-12-28 NOTE — Consult Note (Addendum)
 PHARMACY - ANTICOAGULATION CONSULT NOTE  Pharmacy Consult for Heparin   Indication: pulmonary embolus  Patient Measurements: Height: 5' 4 (162.6 cm) Weight: 90.7 kg (199 lb 15.3 oz) IBW/kg (Calculated) : 54.7 HEPARIN  DW (KG): 75.1  Labs: Recent Labs    12/25/23 1454 12/26/23 0019 12/26/23 0528 12/27/23 0455 12/27/23 1655 12/27/23 1703 12/27/23 1704 12/28/23 0421  HGB  --    < > 11.1* 11.3*   < > 10.9* 10.5* 10.9*  HCT  --    < > 32.1* 34.4*   < > 32.0* 31.0* 33.4*  PLT  --   --  213 247  --   --   --  258  HEPARINUNFRC 0.66   < > 0.53 0.57  --   --   --  0.31  CREATININE 1.41*  --  1.37* 1.40*  --   --   --   --    < > = values in this interval not displayed.    Estimated Creatinine Clearance: 37.3 mL/min (A) (by C-G formula based on SCr of 1.4 mg/dL (H)).   Medical History: Past Medical History:  Diagnosis Date   Allergic rhinitis    Anxiety    Arthritis    Asthma    Atrophic vaginitis    B12 deficiency    Bronchitis, chronic (HCC)    CAD (coronary artery disease)    a. 03/2003 Cath: Nl cors; b. 09/2022 Staged PCI: LM nl, LAD 85p/19m (3.5x26 Onyx Frontier DES), RI small, mild dzs, LCX nl, OM2 mild dzs, RCA 30p.   Cervical disc disease    CKD (chronic kidney disease), stage III (HCC)    Collagen vascular disease    RA   COPD (chronic obstructive pulmonary disease) (HCC)    Cough - post-COVID    Depression    Diastolic dysfunction    a. 11/2020 Echo: EF 45-50%, GrI DD; b. 10/2021 Echo: EF 60-65%, GrII DD; c. 09/2022 Echo: EF 55-60%, no rwma, GrI DD, nl RV fxn, mildly dil LA. No significant valvular dzs; d. 04/2023 Echo: EF 60-65% no rwma, mild LVH, nl RV fxn, RVSP 23.72mmHg. AoV sclerosis.   Fibrocystic breast disease    Foot ulcer (HCC)    GERD (gastroesophageal reflux disease)    Hammer toe    Hyperlipidemia    IBS (irritable bowel syndrome)    Macrocytic anemia    Macular degeneration 2023   Nausea and vomiting 12/23/2023   Obesity    OSA (obstructive sleep  apnea)    Osteoarthrosis    Multi site   Osteopenia    PSVT (paroxysmal supraventricular tachycardia)    a. 04/2022 Zio: Sinus rhythm @ 57 (42-101). Rare PACs/PVCs. 74290 SVT runs lasting up to 59m 12sec - max rate 222. 5 beats NSVT. Triggered events = sinus, SVT, PACs.   Rheumatoid arthritis (HCC)    Shingles    Thyroid  nodule    MULTIPLE   Tonsillitis    recurrent   Umbilical hernia    Assessment: Pt presents to the ED after a fall and increasing SOB last few weeks. Hx of aflutter, SVT, total replacement of hip, CAD, and CKD. Head CT, no note of bleed. Chest CT: Right-sided central and segmental pulmonary emboli. No right heart strain. CBC stable. No note of DOAC PTA. Pharmacy consulted to start heparin  infusion for PE.   Goal of Therapy:  Heparin  level 0.3-0.7 units/ml Monitor platelets by anticoagulation protocol: Yes   12/09 1454 HL 0.66, therapeutic x 1 12/10 0019 HL 0.55,  therapeutic x 2 12/10 0528 HL 0.53, therapeutic x 3 12/11 0455 HL 0.57. therapeutic x 4 12/12 0421 HL 0.31, therapeutic x 5  Plan:  Heparin  level is therapeutic, but on the lower end of goal. Will slightly increase heparin  infusion to 1150 units/hr. Recheck heparin  level and CBC with AM labs.   Demetri Kerman A Loui Massenburg, PharmD Clinical Pharmacist 12/28/2023 6:18 AM

## 2023-12-28 NOTE — Plan of Care (Signed)

## 2023-12-28 NOTE — TOC Transition Note (Signed)
 Transition of Care [facility]) - Discharge Note   Patient Details  Name: Courtney Wilcox MRN: [id number] Date of Birth: Jun 21, 1945  [facility]) CM/SW Contact:  Racheal LITTIE Schimke, RN Phone Number: 12/28/2023, 2:26 PM    Final next level of care: Home w Home Health Services Barriers to Discharge: Barriers Resolved   Patient Goals and CMS Choice Patient states their goals for this hospitalization and ongoing recovery are:: To return home with Home Health.          Discharge Placement                    Patient and family notified of of transfer: 12/28/23  Discharge Plan and Services Additional resources added to the After Visit Summary for                  DME Arranged: N/A DME Agency: NA       HH Arranged: OT, PT, RN HH Agency: [facility] Date [facility] Agency Contacted: 12/28/23   Representative spoke with at [facility] Agency: Joane  Social Drivers of Health (SDOH) Interventions SDOH Screenings   Food Insecurity: No Food Insecurity (12/24/2023)  Housing: Low Risk (12/23/2023)  Transportation Needs: No Transportation Needs (12/23/2023)  Utilities: Not At Risk (12/24/2023)  Depression (PHQ2-9): Low Risk (11/14/2023)  Financial Resource Strain: Low Risk  (12/20/2023)   Received from [facility]  Social Connections: Moderately Isolated (12/23/2023)  Tobacco Use: Low Risk (12/23/2023)     Readmission Risk Interventions    12/25/2023   11:07 AM  Readmission Risk Prevention Plan  Transportation Screening Complete  PCP or Specialist Appt within 3-5 Days Complete  HRI or Home Care Consult Complete  Social Work Consult for Recovery Care Planning/Counseling Complete  Palliative Care Screening Not Applicable  Medication Review Oceanographer) Complete

## 2023-12-28 NOTE — Progress Notes (Signed)
 Rounding Note   Patient Name: Courtney Wilcox Date of Encounter: 12/28/2023   [facility] Cardiologist: Lonni Hanson, MD   Subjective Dyspnea improving. No chest pain or palpitations.  R/LHC on 12/27/2023 showed widely patent previously placed LAD stent with 55% stenosis along the proximal RCA, normal LVEDP and wedge pressure.  Scheduled Meds:  aspirin   81 mg Oral Daily   diclofenac  Sodium  2 g Topical TID   diltiazem   240 mg Oral Daily   ezetimibe   10 mg Oral Daily   fluticasone  furoate-vilanterol  1 puff Inhalation Daily   metoprolol  tartrate  25 mg Oral BID   pantoprazole   40 mg Oral QHS   rosuvastatin   5 mg Oral Daily   sertraline   50 mg Oral Daily   sodium chloride  flush  3 mL Intravenous Q12H   Continuous Infusions:  sodium chloride      heparin  1,150 Units/hr (12/28/23 0733)   promethazine  (PHENERGAN ) injection (IM or IVPB) 12.5 mg (12/24/23 0414)   PRN Meds: sodium chloride , acetaminophen  **OR** acetaminophen , famotidine , gabapentin , HYDROcodone -acetaminophen , ipratropium-albuterol , LORazepam , metoprolol  tartrate, ondansetron  **OR** ondansetron  (ZOFRAN ) IV, mouth rinse, promethazine  (PHENERGAN ) injection (IM or IVPB), sodium chloride  flush   Vital Signs  Vitals:   12/27/23 2108 12/27/23 2339 12/28/23 0431 12/28/23 0807  BP: 108/62 (!) 123/97 (!) 115/40 118/62  Pulse: 69 69 72 72  Resp: 20 20 20 18   Temp: 99 F (37.2 C) 99.1 F (37.3 C) 98.9 F (37.2 C) 98.1 F (36.7 C)  TempSrc:      SpO2: 100% 100% 98% 93%  Weight:      Height:        Intake/Output Summary (Last 24 hours) at 12/28/2023 0928 Last data filed at 12/28/2023 0910 Gross per 24 hour  Intake 500 ml  Output 200 ml  Net 300 ml      12/23/2023    5:16 PM 12/22/2023    3:52 PM 11/30/2023    3:19 PM  Last 3 Weights  Weight (lbs) 199 lb 15.3 oz 200 lb 198 lb 2 oz  Weight (kg) 90.7 kg 90.719 kg 89.869 kg      Telemetry SR, 60s to 70s bpm - Personally Reviewed  ECG  No new  tracings - Personally Reviewed  Physical Exam  GEN: No acute distress.  Obese Neck: No JVD Cardiac: RRR, no murmurs, rubs, or gallops.  Left radial arteriotomy site is without active bleeding, swelling, warmth, erythema, or tenderness to palpation.  Mild soft bruising. Respiratory: Clear to auscultation bilaterally. GI: Soft, nontender, non-distended  MS: No edema; No deformity. Neuro:  Nonfocal  Psych: Normal affect   Labs High Sensitivity Troponin:  No results for input(s): TROPONINIHS in the last 720 hours.  Recent Labs  Lab 12/22/23 1606 12/23/23 1719 12/23/23 1939  TRNPT 40* 38* 35*       Chemistry Recent Labs  Lab 12/22/23 1606 12/23/23 1719 12/25/23 1454 12/26/23 0528 12/27/23 0455 12/27/23 1655 12/27/23 1703 12/27/23 1704  NA 132* 131* 126* 135 140 138 135 134*  K 4.2 4.5 4.1 3.7 3.5 3.5 3.4* 3.3*  CL 95* 93* 94* 99 103  --   --   --   CO2 26 27 18* 24 25  --   --   --   GLUCOSE 117* 93 79 81 92  --   --   --   BUN 17 14 11 11 10   --   --   --   CREATININE 1.41* 1.20* 1.41* 1.37* 1.40*  --   --   --  CALCIUM  8.7* 9.0 8.7* 8.6* 8.8*  --   --   --   MG  --   --   --  1.9  --   --   --   --   PROT 5.8* 6.1*  --   --   --   --   --   --   ALBUMIN 3.6 3.8  --   --   --   --   --   --   AST 25 28  --   --   --   --   --   --   ALT 24 25  --   --   --   --   --   --   ALKPHOS 88 91  --   --   --   --   --   --   BILITOT 0.7 0.7  --   --   --   --   --   --   GFRNONAA 38* 47* 38* 40* 39*  --   --   --   ANIONGAP 11 11 14 12 11   --   --   --     Lipids No results for input(s): CHOL, TRIG, HDL, LABVLDL, LDLCALC, CHOLHDL in the last 168 hours.  Hematology Recent Labs  Lab 12/26/23 0528 12/27/23 0455 12/27/23 1655 12/27/23 1703 12/27/23 1704 12/28/23 0421  WBC 6.7 7.2  --   --   --  7.6  RBC 3.29* 3.41*  --   --   --  3.26*  HGB 11.1* 11.3*   < > 10.9* 10.5* 10.9*  HCT 32.1* 34.4*   < > 32.0* 31.0* 33.4*  MCV 97.6 100.9*  --   --   --   102.5*  MCH 33.7 33.1  --   --   --  33.4  MCHC 34.6 32.8  --   --   --  32.6  RDW 14.6 14.9  --   --   --  14.9  PLT 213 247  --   --   --  258   < > = values in this interval not displayed.   Thyroid  No results for input(s): TSH, FREET4 in the last 168 hours.  BNP Recent Labs  Lab 12/23/23 1719  PROBNP 272.0    DDimer No results for input(s): DDIMER in the last 168 hours.   Radiology  US  Venous Img Lower Bilateral (DVT) Result Date: 12/26/2023 IMPRESSION: Bilateral gastrocnemius calf DVT. Overall very low thrombus burden. Electronically Signed   By: CHRISTELLA.  Shick M.D.   On: 12/26/2023 13:00     Patient Profile   78 y.o. 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   Assessment & Plan  Acute pulmonary embolism Presenting with pain to ribs, vomiting, pain on inspiration, recent fall - CT chest with right sided central and segmental pulmonary emboli without right heart strain - On heparin  infusion, transition to oral anticoagulation at the discretion of internal medicine - Given prior history of PE, will require lifelong anticoagulation   Coronary artery disease with stable angina - Prior stent proximal LAD 2024 - R/LHC this admission with patent LAD stent with nonobstructive disease involving the RCA, normal LVEDP and PCWP - Crestor  and Zetia   - ASA  81 mg   PSVT - Burden improving - Continue diltiazem  ER up to 240 daily and Lopressor  25 mg twice daily   Chronic kidney disease stage III AA - Baseline creatinine 1.4, stable   Essential hypertension - Well-controlled - Remains on diltiazem  ER 240 mg and Lopressor  25 mg twice daily   Hyperlipidemia - LDL 72 - On rosuvastatin  and ezetimibe      For  questions or updates, please contact [facility] Please consult [email or web address] for contact info under     Signed, Bernardino Bring, PA-C  12/28/2023, 9:28 AM

## 2023-12-28 NOTE — Progress Notes (Signed)
 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  Previously had a pulmonary embolism and had Eliquis  but was taken off due to being on dual antiplatelet therapy and easy bruising.  She reports that she fell down approximately 4 days prior to admission and has been feeling chest discomfort with worsening dyspnea since then.  She had a CT angio PE protocol 1 month ago which was negative for PE.  On arrival to the ER she was tachypneic and tachycardic with cardiac biomarkers were normal and patient was not hypoxemic on ambient air.  She had a repeat CTA PE protocol showing multiple subsegmental emboli without right heart strain and a left pleural effusion which is small.  The emboli are mostly right-sided central and lobar pulmonary emboli.  Patient has been placed on heparin  drip and is doing well without any signs of bleeding.  PCCM consultation placed for additional evaluation management  12/27/23-  patient sitting up in bed in no distress.  I spoke with daughter also in hallway.  Patient is clinically improved, she did have EKG done today. We discussed multifactorial etiology of her chronic dyspnea including , rheumatoid arthritis precluding much activity, physical deconditioning, morbid obesity,  asthma with recurrent exacerbation, previous PE, current PE and advanced age. We discussed need for chronic anticoagulation.   12/28/23- patient seen at bedside.  She reports no chest pains or Dyspnea at rest.  She is on ambient air during my evaluation and saturates >90%.  She is asking regarding home dc.  From my perspective she can be dcd home on eliquis  and I can follow up in clinic.   PAST MEDICAL HISTORY   Past Medical History:  Diagnosis Date   Allergic rhinitis    Anxiety    Arthritis    Asthma    Atrophic vaginitis    B12 deficiency    Bronchitis, chronic (HCC)    CAD (coronary artery disease)    a. 03/2003 Cath: Nl cors; b. 09/2022 Staged PCI: LM nl, LAD 85p/15m (3.5x26 Onyx Frontier DES), RI small, mild dzs, LCX nl, OM2 mild dzs, RCA 30p.   Cervical disc disease    CKD (chronic kidney disease), stage III (HCC)    Collagen vascular disease    RA   COPD (chronic obstructive pulmonary disease) (HCC)    Cough - post-COVID    Depression    Diastolic dysfunction    a. 11/2020 Echo: EF 45-50%, GrI DD; b. 10/2021 Echo: EF 60-65%, GrII DD; c. 09/2022 Echo: EF 55-60%, no rwma, GrI DD, nl RV fxn, mildly dil LA. No significant valvular dzs; d. 04/2023 Echo: EF 60-65% no rwma, mild LVH, nl RV fxn, RVSP 23.78mmHg. AoV sclerosis.   Fibrocystic breast disease    Foot ulcer (HCC)  GERD (gastroesophageal reflux disease)    Hammer toe    Hyperlipidemia    IBS (irritable bowel syndrome)    Macrocytic anemia    Macular degeneration 2023   Nausea and vomiting 12/23/2023   Obesity    OSA (obstructive sleep apnea)    Osteoarthrosis    Multi site   Osteopenia    PSVT (paroxysmal supraventricular tachycardia)    a. 04/2022 Zio: Sinus rhythm @ 57 (42-101). Rare PACs/PVCs. 74290 SVT runs lasting up to 40m 12sec - max rate 222. 5 beats NSVT. Triggered events = sinus, SVT, PACs.   Rheumatoid arthritis (HCC)    Shingles    Thyroid  nodule    MULTIPLE   Tonsillitis    recurrent   Umbilical  hernia      SURGICAL HISTORY   Past Surgical History:  Procedure Laterality Date   BACK SURGERY  2011   LAMINECTOMY   BREAST BIOPSY Right    neg   CERVICAL FUSION  2011, 2012   X 2   COLONOSCOPY  2005, 2015   COLONOSCOPY WITH PROPOFOL  N/A 08/04/2020   Procedure: COLONOSCOPY WITH PROPOFOL ;  Surgeon: Dessa Reyes ORN, MD;  Location: [facility] ENDOSCOPY;  Service: Endoscopy;  Laterality: N/A;   CORONARY STENT INTERVENTION N/A 10/02/2022   Procedure: CORONARY STENT INTERVENTION;  Surgeon: Darron Deatrice LABOR, MD;  Location: [facility];  Service: Cardiovascular;  Laterality: N/A;   ESOPHAGOGASTRODUODENOSCOPY (EGD) WITH PROPOFOL  N/A 09/26/2021   Procedure: ESOPHAGOGASTRODUODENOSCOPY (EGD) WITH PROPOFOL ;  Surgeon: Onita Elspeth Sharper, DO;  Location: [facility] ENDOSCOPY;  Service: Gastroenterology;  Laterality: N/A;   ESOPHAGOGASTRODUODENOSCOPY ENDOSCOPY  2005, 2013, 2015   EYE SURGERY Bilateral    Cataract Extraction with IOL    HERNIA REPAIR     JOINT REPLACEMENT     KNEE ARTHROPLASTY Left 07/12/2016   Procedure: COMPUTER ASSISTED TOTAL KNEE ARTHROPLASTY;  Surgeon: Mardee Lynwood SQUIBB, MD;  Location: [facility] ORS;  Service: Orthopedics;  Laterality: Left;   Laryngeal tear after intubation w/repair     LUMBAR DISC SURGERY  12/2009   REPLACEMENT TOTAL KNEE Right 06/2007   DR. HOOTEN, [facility]   REPLACEMENT UNICONDYLAR JOINT KNEE     DR. HOOTEN, [facility]   RIGHT/LEFT HEART CATH AND CORONARY ANGIOGRAPHY N/A 09/29/2022   Procedure: RIGHT/LEFT HEART CATH AND CORONARY ANGIOGRAPHY;  Surgeon: Darron Deatrice LABOR, MD;  Location: [facility];  Service: Cardiovascular;  Laterality: N/A;   RIGHT/LEFT HEART CATH AND CORONARY ANGIOGRAPHY N/A 12/27/2023   Procedure: RIGHT/LEFT HEART CATH AND CORONARY ANGIOGRAPHY;  Surgeon: Anner Alm ORN, MD;  Location: [facility];  Service: Cardiovascular;  Laterality: N/A;   SHOULDER ARTHROSCOPY WITH ROTATOR CUFF REPAIR Right    TONSILLECTOMY     TOTAL HIP  ARTHROPLASTY Right 01/31/2017   Procedure: TOTAL HIP ARTHROPLASTY;  Surgeon: Mardee Lynwood SQUIBB, MD;  Location: [facility] OR[facility];  Service: Orthopedics;  Laterality: Right;   TUBAL LIGATION     UPPER GI ENDOSCOPY     VENTRAL HERNIA REPAIR N/A 09/04/2023   Procedure: REPAIR, HERNIA, VENTRAL;  Surgeon: Marinda Jayson KIDD, MD;  Location: [facility] ORS;  Service: General;  Laterality: N/A;     FAMILY HISTORY   Family History  Problem Relation Age of Onset   Asthma Mother    Breast cancer Mother 107   Emphysema Father        smoked   Lung cancer Father        smoked   Coronary artery disease Father 31  SOCIAL HISTORY   Social History   Tobacco Use   Smoking status: Never   Smokeless tobacco: Never  Vaping Use   Vaping status: Never Used  Substance Use Topics   Alcohol use: No   Drug use: No     MEDICATIONS    Home Medication:    Current Medication:  Current Facility-Administered Medications:    0.9 %  sodium chloride  infusion, 250 mL, Intravenous, PRN, Anner Alm ORN, MD   acetaminophen  (TYLENOL ) tablet 650 mg, 650 mg, Oral, Q6H PRN, 650 mg at 12/28/23 0345 **OR** acetaminophen  (TYLENOL ) suppository 650 mg, 650 mg, Rectal, Q6H PRN, Anner Alm ORN, MD   aspirin  chewable tablet 81 mg, 81 mg, Oral, Daily, Anner Alm ORN, MD, 81 mg at 12/28/23 9096   diclofenac  Sodium (VOLTAREN ) 1 % topical gel 2 g, 2 g, Topical, TID, Anner Alm ORN, MD   diltiazem  (CARDIZEM  CD) 24 hr capsule 240 mg, 240 mg, Oral, Daily, Anner Alm ORN, MD, 240 mg at 12/28/23 9096   ezetimibe  (ZETIA ) tablet 10 mg, 10 mg, Oral, Daily, Anner Alm ORN, MD, 10 mg at 12/28/23 9095   famotidine  (PEPCID ) tablet 20 mg, 20 mg, Oral, Daily PRN, Anner Alm ORN, MD   fluticasone  furoate-vilanterol (BREO ELLIPTA ) 100-25 MCG/ACT 1 puff, 1 puff, Inhalation, Daily, Anner Alm ORN, MD, 1 puff at 12/28/23 9095   gabapentin  (NEURONTIN ) capsule 300 mg, 300 mg, Oral, QHS PRN, Anner Alm ORN, MD, 300 mg at 12/27/23 2204    heparin  ADULT infusion 100 units/mL (25000 units/250mL), 1,150 Units/hr, Intravenous, Continuous, Nazari, Walid A, RPH, Last Rate: 11.5 mL/hr at 12/28/23 0733, 1,150 Units/hr at 12/28/23 0733   HYDROcodone -acetaminophen  (NORCO/VICODIN) 5-325 MG per tablet 1-2 tablet, 1-2 tablet, Oral, Q4H PRN, Anner Alm ORN, MD, 1 tablet at 12/26/23 1152   ipratropium-albuterol  (DUONEB) 0.5-2.5 (3) MG/3ML nebulizer solution 3 mL, 3 mL, Nebulization, Q4H PRN, Anner Alm ORN, MD, 3 mL at 12/26/23 1246   LORazepam  (ATIVAN ) injection 1 mg, 1 mg, Intravenous, Q6H PRN, Anner Alm ORN, MD, 1 mg at 12/24/23 1327   metoprolol  tartrate (LOPRESSOR ) injection 5 mg, 5 mg, Intravenous, Q6H PRN, Anner Alm ORN, MD   metoprolol  tartrate (LOPRESSOR ) tablet 25 mg, 25 mg, Oral, BID, Anner Alm ORN, MD, 25 mg at 12/28/23 9095   ondansetron  (ZOFRAN ) tablet 4 mg, 4 mg, Oral, Q6H PRN, 4 mg at 12/27/23 2236 **OR** ondansetron  (ZOFRAN ) injection 4 mg, 4 mg, Intravenous, Q6H PRN, Anner Alm ORN, MD, 4 mg at 12/24/23 2211   Oral care mouth rinse, 15 mL, Mouth Rinse, PRN, Anner Alm ORN, MD   pantoprazole  (PROTONIX ) EC tablet 40 mg, 40 mg, Oral, QHS, Anner Alm ORN, MD, 40 mg at 12/27/23 2204   promethazine  (PHENERGAN ) 12.5 mg in sodium chloride  0.9 % 50 mL IVPB, 12.5 mg, Intravenous, Q6H PRN, Anner Alm ORN, MD, Last Rate: 150 mL/hr at 12/24/23 0414, 12.5 mg at 12/24/23 0414   rosuvastatin  (CRESTOR ) tablet 5 mg, 5 mg, Oral, Daily, Anner Alm ORN, MD, 5 mg at 12/28/23 9095   sertraline  (ZOLOFT ) tablet 50 mg, 50 mg, Oral, Daily, Anner Alm ORN, MD, 50 mg at 12/28/23 9095   sodium chloride  flush (NS) 0.9 % injection 3 mL, 3 mL, Intravenous, Q12H, Anner Alm ORN, MD, 3 mL at 12/27/23 2204   sodium chloride  flush (NS) 0.9 % injection 3 mL, 3 mL, Intravenous, PRN, Anner Alm ORN, MD    ALLERGIES   Amiodarone , Lactose, Other, Biaxin [clarithromycin], Hydrocodone -acetaminophen , Oxycodone , Sulfa antibiotics, and  Sulfasalazine  REVIEW OF SYSTEMS    Review of Systems:  Gen:  Denies  fever, sweats, chills weigh loss  HEENT: Denies blurred vision, double vision, ear pain, eye pain, hearing loss, nose bleeds, sore throat Cardiac:  No dizziness, chest pain or heaviness, chest tightness,edema Resp:   reports dyspnea chronically  Gi: Denies swallowing difficulty, stomach pain, nausea or vomiting, diarrhea, constipation, bowel incontinence Gu:  Denies bladder incontinence, burning urine Ext:   Denies Joint pain, stiffness or swelling Skin: Denies  skin rash, easy bruising or bleeding or hives Endoc:  Denies polyuria, polydipsia , polyphagia or weight change Psych:   Denies depression, insomnia or hallucinations   Other:  All other systems negative   VS: BP 118/62   Pulse 72   Temp 98.1 F (36.7 C)   Resp 18   Ht 5' 4 (1.626 m)   Wt 90.7 kg   SpO2 93%   BMI 34.32 kg/m      PHYSICAL EXAM    GENERAL:NAD, no fevers, chills, no weakness no fatigue HEAD: Normocephalic, atraumatic.  EYES: Pupils equal, round, reactive to light. Extraocular muscles intact. No scleral icterus.  MOUTH: Moist mucosal membrane. Dentition intact. No abscess noted.  EAR, NOSE, THROAT: Clear without exudates. No external lesions.  NECK: Supple. No thyromegaly. No nodules. No JVD.  PULMONARY: decreased breath sounds with mild rhonchi worse at bases bilaterally.  CARDIOVASCULAR: S1 and S2. Regular rate and rhythm. No murmurs, rubs, or gallops. No edema. Pedal pulses 2+ bilaterally.  GASTROINTESTINAL: Soft, nontender, nondistended. No masses. Positive bowel sounds. No hepatosplenomegaly.  MUSCULOSKELETAL: No swelling, clubbing, or edema. Range of motion full in all extremities.  NEUROLOGIC: Cranial nerves II through XII are intact. No gross focal neurological deficits. Sensation intact. Reflexes intact.  SKIN: No ulceration, lesions, rashes, or cyanosis. Skin warm and dry. Turgor intact.  PSYCHIATRIC: Mood,  affect within normal limits. The patient is awake, alert and oriented x 3. Insight, judgment intact.       IMAGING  Study Result  Narrative & Impression  CLINICAL DATA:  Clemens yesterday, nausea and vomiting, weakness, increasing shortness of breath   EXAM: CT ANGIOGRAPHY CHEST   CT ABDOMEN AND PELVIS WITH CONTRAST   TECHNIQUE: Multidetector CT imaging of the chest was performed using the standard protocol during bolus administration of intravenous contrast. Multiplanar CT image reconstructions and MIPs were obtained to evaluate the vascular anatomy. Multidetector CT imaging of the abdomen and pelvis was performed using the standard protocol during bolus administration of intravenous contrast.   RADIATION DOSE REDUCTION: This exam was performed according to the departmental dose-optimization program which includes automated exposure control, adjustment of the mA and/or kV according to patient size and/or use of iterative reconstruction technique.   CONTRAST:  OMNIPAQUE  IOHEXOL  350 MG/ML SOLN   COMPARISON:  12/23/2023, 11/21/2023, 09/04/2023   FINDINGS: CTA CHEST FINDINGS   Cardiovascular: This is a technically adequate evaluation of the pulmonary vasculature. Right-sided central and lobar pulmonary emboli are identified. No evidence of right heart strain.   Lipomatous hypertrophy of the interatrial septum is again noted. No pericardial effusion. Normal caliber of the thoracic aorta. Stable atherosclerosis of the aorta and coronary vasculature.   Mediastinum/Nodes: Stable appearance of the thyroid . Trachea and esophagus are unremarkable. No pathologic adenopathy.   Lungs/Pleura: Trace left pleural effusion. No acute airspace disease or pneumothorax. Dependent hypoventilatory changes are seen within the lower lobes.   Musculoskeletal: No acute or destructive bony abnormalities. Reconstructed images demonstrate no additional findings.   Review  of the MIP images  confirms the above findings.   CT ABDOMEN and PELVIS FINDINGS   Hepatobiliary: No focal liver abnormality is seen. No gallstones, gallbladder wall thickening, or biliary dilatation.   Pancreas: Unremarkable. No pancreatic ductal dilatation or surrounding inflammatory changes.   Spleen: Normal in size without focal abnormality.   Adrenals/Urinary Tract: Bilateral renal cortical atrophy again noted. No abnormal renal enhancement. No urinary tract calculi or obstructive uropathy. The adrenals are stable. Bladder is decompressed, limiting its evaluation.   Stomach/Bowel: No bowel obstruction or ileus. Normal appendix right lower quadrant. Sigmoid diverticulosis without evidence of acute diverticulitis. No bowel wall thickening or inflammatory change.   Vascular/Lymphatic: No significant vascular findings are present. No enlarged abdominal or pelvic lymph nodes.   Reproductive: Uterus and bilateral adnexa are unremarkable.   Other: No free fluid or free intraperitoneal gas. No abdominal wall hernia.   Musculoskeletal: Unremarkable right hip arthroplasty. There are no acute or destructive bony abnormalities. Reconstructed images demonstrate no additional findings.   Review of the MIP images confirms the above findings.   IMPRESSION: Chest:   1. Right-sided central and segmental pulmonary emboli. No right heart strain. 2. Trace left pleural effusion. 3. Aortic Atherosclerosis (ICD10-I70.0). Coronary artery atherosclerosis.   Abdomen/pelvis:   1. No acute intra-abdominal or intrapelvic process. 2. Sigmoid diverticulosis without diverticulitis.   Critical Value/emergent results were called by telephone at the time of interpretation on 12/23/2023 at 7:09 pm to provider PHILLIP STAFFORD , who verbally acknowledged these results.     Electronically Signed   By: Ozell Daring M.D.   On: 12/23/2023 19:12    ASSESSMENT/PLAN   Acute nonmassive pulmonary embolism    -  Patient currently is in no distress and in general is with chronic dyspnea and chronic physical deconditioning with morbid obesity.   -She is currently on 2 L/min nasal cannula I think this could be weaned down but she may be able to go home on oxygen as well.  -Patient may be transition to p.o. Eliquis  and sent home with outpatient follow-up  - Recommend PT OT if DVT US  is clear.  If not she may need an IVC filter - No indication for pulmonary thrombectomy at this time  Asthma COPD overlap - Patient is currently on DuoNebs as needed Breo Ellipta  100/25 once daily as ordered   Rheumatoid arthritis Patient takes Arava  and Plaquenil    Bibasilar atelectasis with obesity hypoventilation -Patient would benefit from using incentive spirometry - I have discussed incentive spirometry and chest physiotherapy as well as encouraged patient to use this device multiple times each hour while inpatient   Thank you for allowing me to participate in the care of this patient.   Patient/Family are satisfied with care plan and all questions have been answered.    Provider disclosure: Patient with at least one acute or chronic illness or injury that poses a threat to life or bodily function and is being managed actively during this encounter.  All of the below services have been performed independently by signing provider:  review of prior documentation from internal and or external health records.  Review of previous and current lab results.  Interview and comprehensive assessment during patient visit today. Review of current and previous chest radiographs/CT scans. Discussion of management and test interpretation with health care team and patient/family.   This document was prepared using Dragon voice recognition software and may include unintentional dictation errors.     Lauren Aguayo, M.D.  Division of Pulmonary &  Critical Care Medicine

## 2023-12-31 ENCOUNTER — Ambulatory Visit: Admitting: Cardiology

## 2023-12-31 ENCOUNTER — Telehealth: Payer: Self-pay

## 2023-12-31 NOTE — Telephone Encounter (Signed)
 I spoke with Courtney Wilcox's Daughter Courtney Wilcox with an update from Suzann Riddle, NP. Today's follow up appointment with EP was to discuss SVT. Patient was recently released from the hospital on 12/28/2023. Patient was in sinus rhythm during that stay and there would be no reason to keep today's appointment unless it was patient preference. Patient's daughter agreed that since they saw Dr. Mady during the hospitalization and changes were made then they would cancel the appointment with Suzann Riddle, NP and keep the one month follow up with Dr. Mady. Daughter verbalized understanding and all questions answered at this time. Suzann Riddle, NP notified.

## 2024-01-18 ENCOUNTER — Ambulatory Visit
Admission: RE | Admit: 2024-01-18 | Discharge: 2024-01-18 | Disposition: A | Discharge status: Home / Self Care | Source: Ambulatory Visit

## 2024-01-18 VITALS — BP 123/79 | HR 65 | Temp 98.0°F | Resp 19

## 2024-01-18 DIAGNOSIS — J441 Chronic obstructive pulmonary disease with (acute) exacerbation: Secondary | ICD-10-CM | POA: Diagnosis not present

## 2024-01-18 HISTORY — DX: Other pulmonary embolism without acute cor pulmonale: I26.99

## 2024-01-18 LAB — POC SOFIA SARS ANTIGEN FIA: SARS Coronavirus 2 Ag: NEGATIVE

## 2024-01-18 LAB — POCT INFLUENZA A/B
Influenza A, POC: NEGATIVE
Influenza B, POC: NEGATIVE

## 2024-01-18 MED ORDER — PREDNISONE 10 MG (21) PO TBPK: ORAL_TABLET | Freq: Every day | ORAL | 0 refills | Status: AC

## 2024-01-18 MED ORDER — DOXYCYCLINE HYCLATE 100 MG PO CAPS: 100.0000 mg | ORAL_CAPSULE | Freq: Two times a day (BID) | ORAL | 0 refills | Status: AC

## 2024-01-18 NOTE — Discharge Instructions (Signed)
 Today you are being treated for an exacerbation of your COPD  COVID and flu testing negative  Take doxycycline  twice daily for 7 days for treatment of bacteria  Begin prednisone  starting tomorrow morning to open and relax the airway making it easier for you to breathe    You can take Tylenol  and/or Ibuprofen as needed for fever reduction and pain relief.   For cough: honey 1/2 to 1 teaspoon (you can dilute the honey in water  or another fluid).  You can also use guaifenesin and dextromethorphan for cough. You can use a humidifier for chest congestion and cough.  If you don't have a humidifier, you can sit in the bathroom with the hot shower running.      For sore throat: try warm salt water  gargles, cepacol lozenges, throat spray, warm tea or water  with lemon/honey, popsicles or ice, or OTC cold relief medicine for throat discomfort.   For congestion: take a daily anti-histamine like Zyrtec, Claritin, and a oral decongestant, such as pseudoephedrine .  You can also use Flonase  1-2 sprays in each nostril daily.   It is important to stay hydrated: drink plenty of fluids (water , gatorade/powerade/pedialyte, juices, or teas) to keep your throat moisturized and help further relieve irritation/discomfort.

## 2024-01-18 NOTE — ED Notes (Signed)
 Patient triage by provider Teresa Shelba SAUNDERS, NP

## 2024-01-18 NOTE — ED Provider Notes (Signed)
 79   [facility]    CSN: [id number] Arrival date & time: 01/18/24  1633      History   Chief Complaint Chief Complaint  Patient presents with   Cough    Chest tightness and cough. Muscle and joint pain. Low grade fever. Symptoms started on Sunday (01/13/24). - Entered by patient    HPI MANAIA SAMAD is a 79 y.o. female.    Patient presents for evaluation of a fever present for 5 days, over the past 2 to 3 days has had increased congestion, increased shortness of breath at rest from baseline, wheezing.  Over the past 24 hours has begun to experience nausea without vomiting.  Has attempted use of Zofran .  History of COPD, asthma, currently has PE.     Past Medical History:  Diagnosis Date   Allergic rhinitis    Anxiety    Arthritis    Asthma    Atrophic vaginitis    B12 deficiency    Bronchitis, chronic (HCC)    CAD (coronary artery disease)    a. 03/2003 Cath: Nl cors; b. 09/2022 Staged PCI: LM nl, LAD 85p/20m (3.5x26 Onyx Frontier DES), RI small, mild dzs, LCX nl, OM2 mild dzs, RCA 30p.   Cervical disc disease    CKD (chronic kidney disease), stage III (HCC)    Collagen vascular disease    RA   COPD (chronic obstructive pulmonary disease) (HCC)    Cough - post-COVID    Depression    Diastolic dysfunction    a. 11/2020 Echo: EF 45-50%, GrI DD; b. 10/2021 Echo: EF 60-65%, GrII DD; c. 09/2022 Echo: EF 55-60%, no rwma, GrI DD, nl RV fxn, mildly dil LA. No significant valvular dzs; d. 04/2023 Echo: EF 60-65% no rwma, mild LVH, nl RV fxn, RVSP 23.67mmHg. AoV sclerosis.   Fibrocystic breast disease    Foot ulcer (HCC)    GERD (gastroesophageal reflux disease)    Hammer toe    Hyperlipidemia    IBS (irritable bowel syndrome)    Macrocytic anemia    Macular degeneration 2023   Nausea and vomiting 12/23/2023   Obesity    OSA (obstructive sleep apnea)    Osteoarthrosis    Multi site   Osteopenia    PSVT (paroxysmal supraventricular tachycardia)    a. 04/2022  Zio: Sinus rhythm @ 57 (42-101). Rare PACs/PVCs. 74290 SVT runs lasting up to 35m 12sec - max rate 222. 5 beats NSVT. Triggered events = sinus, SVT, PACs.   Pulmonary embolism (HCC)    Rheumatoid arthritis (HCC)    Shingles    Thyroid  nodule    MULTIPLE   Tonsillitis    recurrent   Umbilical hernia     Patient Active Problem List   Diagnosis Date Noted   Pulmonary embolus (HCC) 12/23/2023   COPD (chronic obstructive pulmonary disease) (HCC) 12/23/2023   HTN (hypertension) 12/23/2023   Chronic kidney disease, stage 3a (HCC) 12/23/2023   Nausea and vomiting 12/23/2023   Contusion of chest wall, left, subsequent encounter 12/23/2023   OSA (obstructive sleep apnea) 09/06/2023   Hyperkalemia 09/06/2023   Incarcerated ventral hernia 09/04/2023   Acute respiratory failure with hypoxia (HCC) 04/25/2023   NSTEMI (non-ST elevated myocardial infarction) (HCC) 04/25/2023   SIRS (systemic inflammatory response syndrome) (HCC) 04/25/2023   Respiratory infection 04/25/2023   Sepsis (HCC) 04/25/2023   CAD S/P percutaneous coronary angioplasty 09/30/2022   Class 1 obesity due to excess calories with body mass index (BMI) of 34.0 to  34.9 in adult 09/28/2022   Unstable angina (HCC) 09/28/2022   Coronary artery disease due to calcified coronary lesion 09/28/2022   CKD stage 3b, GFR 30-44 ml/min (HCC) 09/28/2022   GERD without esophagitis 09/28/2022   History of pulmonary embolism 01/20/2022   Anemia in CKD (chronic kidney disease) 12/09/2021   LVH (left ventricular hypertrophy) due to hypertensive disease, without heart failure 09/15/2021   PSVT (paroxysmal supraventricular tachycardia) 11/19/2020   History of atrial flutter 11/19/2020   Chronic venous insufficiency 08/15/2020   Carotid stenosis 02/04/2020   Hyperlipidemia LDL goal <70 02/04/2020   Multiple thyroid  nodules 05/21/2018   Thrombocytopenia 07/31/2017   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/2017   Spinal stenosis of lumbar  region with neurogenic claudication 12/13/2016   Primary osteoarthritis of right hip 12/13/2016   S/P total knee arthroplasty 07/12/2016   Anemia of chronic disease 10/30/2013   Anxiety 10/30/2013   Osteoarthritis 07/08/2013   Osteopenia 07/08/2013   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   Chest pain 02/19/2013   Rheumatoid arthritis (HCC) 10/30/2012   Shortness of breath 10/29/2012   Spinal stenosis in cervical region 10/29/2012    Past Surgical History:  Procedure Laterality Date   BACK SURGERY  2011   LAMINECTOMY   BREAST BIOPSY Right    neg   CERVICAL FUSION  2011, 2012   X 2   COLONOSCOPY  2005, 2015   COLONOSCOPY WITH PROPOFOL  N/A 08/04/2020   Procedure: COLONOSCOPY WITH PROPOFOL ;  Surgeon: Dessa Reyes ORN, MD;  Location: [facility];  Service: Endoscopy;  Laterality: N/A;   CORONARY STENT INTERVENTION N/A 10/02/2022   Procedure: CORONARY STENT INTERVENTION;  Surgeon: Darron Deatrice LABOR, MD;  Location: [facility];  Service: Cardiovascular;  Laterality: N/A;   ESOPHAGOGASTRODUODENOSCOPY (EGD) WITH PROPOFOL  N/A 09/26/2021   Procedure: ESOPHAGOGASTRODUODENOSCOPY (EGD) WITH PROPOFOL ;  Surgeon: Onita Elspeth Sharper, DO;  Location: [facility] END[facility]COPY;  Service: Gastroenterology;  Laterality: N/A;   ESOPHAGOGASTRODUODENOSCOPY ENDOSCOPY  2005, 2013, 2015   EYE SURGERY Bilateral    Cataract Extraction with IOL    HERNIA REPAIR     JOINT REPLACEMENT     KNEE ARTHROPLASTY Left 07/12/2016   Procedure: COMPUTER ASSISTED TOTAL KNEE ARTHROPLASTY;  Surgeon: Mardee Lynwood SQUIBB, MD;  Location: [facility] OR[facility];  Service: Orthopedics;  Laterality: Left;   Laryngeal tear after intubation w/repair     LUMBAR DISC SURGERY  12/2009   REPLACEMENT TOTAL KNEE Right 06/2007   DR. HOOTEN, [facility]   REPLACEMENT UNICONDYLAR JOINT KNEE     DR. HOOTEN, [facility]   RIGHT/LEFT HEART CATH AND CORONARY ANGIOGRAPHY N/A 09/29/2022   Procedure: RIGHT/LEFT HEART CATH AND CORONARY ANGIOGRAPHY;  Surgeon: Darron Deatrice LABOR, MD;   Location: [facility];  Service: Cardiovascular;  Laterality: N/A;   RIGHT/LEFT HEART CATH AND CORONARY ANGIOGRAPHY N/A 12/27/2023   Procedure: RIGHT/LEFT HEART CATH AND CORONARY ANGIOGRAPHY;  Surgeon: Anner Alm ORN, MD;  Location: [facility];  Service: Cardiovascular;  Laterality: N/A;   SHOULDER ARTHROSCOPY WITH ROTATOR CUFF REPAIR Right    TONSILLECTOMY     TOTAL HIP ARTHROPLASTY Right 01/31/2017   Procedure: TOTAL HIP ARTHROPLASTY;  Surgeon: Mardee Lynwood SQUIBB, MD;  Location: [facility];  Service: Orthopedics;  Laterality: Right;   TUBAL LIGATION     UPPER GI ENDOSCOPY     VENTRAL HERNIA REPAIR N/A 09/04/2023   Procedure: REPAIR, HERNIA, VENTRAL;  Surgeon: Marinda Jayson KIDD, MD;  Location: [facility] OR[facility];  Service: General;  Laterality: N/A;    OB History  No obstetric history on file.      Home Medications    Prior to Admission medications  Medication Sig Start Date End Date Taking? Authorizing Provider  acetaminophen  (TYLENOL ) 500 MG tablet Take 1,000 mg by mouth 3 (three) times daily as needed for mild pain.     [provider]  albuterol  (VENTOLIN  HFA) 108 (90 Base) MCG/ACT inhaler Inhale 1-2 puffs into the lungs every 6 (six) hours as needed for wheezing or shortness of breath.    [provider]  apixaban  (ELIQUIS ) 5 MG TABS tablet Take 2 tablets (10mg ) twice daily for 7 days, then 1 tablet (5mg ) twice daily 12/28/23   Alexander, Natalie, DO  Cyanocobalamin 1000 MCG/ML KIT Inject as directed every 30 (thirty) days.    [provider]  diclofenac  Sodium (VOLTAREN ) 1 % GEL Apply 2 g topically 4 (four) times daily as needed (joint/muscle ache). 12/28/23   Alexander, Natalie, DO  diltiazem  (CARDIZEM  CD) 240 MG 24 hr capsule Take 1 capsule (240 mg total) by mouth daily. 12/29/23   Alexander, Natalie, DO  diphenhydrAMINE  (BENADRYL ) 25 mg capsule Take 25 mg by mouth every 6 (six) hours as needed for itching (Take with Hydrocoone/APAP for  itching.).    [provider]  Dupilumab 300 MG/2ML SOPN Inject 4 mLs into the skin every 14 (fourteen) days. 12/15/21   [provider]  EPINEPHrine  0.3 mg/0.3 mL IJ SOAJ injection Inject 0.3 mg into the muscle as needed. 11/29/21   [provider]  ezetimibe  (ZETIA ) 10 MG tablet Take 1 tablet (10 mg total) by mouth daily. 12/29/23   Alexander, Natalie, DO  famotidine  (PEPCID ) 20 MG tablet Take 20 mg by mouth daily as needed.    [provider]  fluticasone  furoate-vilanterol (BREO ELLIPTA ) 100-25 MCG/ACT AEPB Inhale 1 puff into the lungs daily. 12/29/23   Alexander, Natalie, DO  gabapentin  (NEURONTIN ) 300 MG capsule Take 300 mg by mouth at bedtime as needed.    [provider]  HYDROcodone -acetaminophen  (NORCO/VICODIN) 5-325 MG tablet Take 1 tablet by mouth every 4 (four) hours as needed. 12/22/23   Paduchowski, Kevin, MD  ipratropium (ATROVENT) 0.03 % nasal spray Place 1-2 sprays into both nostrils daily. 11/20/23 11/19/24  [provider]  levalbuterol  (XOPENEX ) 0.63 MG/3ML nebulizer solution Take 0.63 mg by nebulization every 6 (six) hours as needed for wheezing. 04/25/21   [provider]  meclizine (ANTIVERT) 12.5 MG tablet Take 12.5 mg by mouth daily as needed for dizziness or nausea. 10/05/18   [provider]  metoprolol  tartrate (LOPRESSOR ) 25 MG tablet Take 1 tablet (25 mg total) by mouth 2 (two) times daily. 12/28/23   Alexander, Natalie, DO  montelukast  (SINGULAIR ) 10 MG tablet Take 10 mg by mouth daily.    [provider]  Multiple Vitamins-Minerals (PRESERVISION AREDS 2 PO) Take 2 tablets by mouth daily. Sometimes takes 2 at the same time    [provider]  ondansetron  (ZOFRAN -ODT) 4 MG disintegrating tablet Take 1 tablet (4 mg total) by mouth every 8 (eight) hours as needed for nausea or vomiting. 12/22/23   Dorothyann Drivers, MD  pantoprazole  (PROTONIX ) 40 MG tablet Take 40 mg by mouth daily.     [provider]  rosuvastatin  (CRESTOR ) 5 MG tablet Take 1 tablet (5 mg total) by mouth daily. 12/29/23   Alexander, Natalie, DO  sertraline  (ZOLOFT ) 50 MG tablet Take 50 mg by mouth daily.    [provider]  SIMETHICONE  PO Take 250 mg by mouth  as needed.    [provider]  triamcinolone  (NASACORT ) 55 MCG/ACT AERO nasal inhaler Place 1 spray into the nose daily.    [provider]    Family History Family History  Problem Relation Age of Onset   Asthma Mother    Breast cancer Mother 62   Emphysema Father        smoked   Lung cancer Father        smoked   Coronary artery disease Father 53    Social History Social History[1]   Allergies   Amiodarone , Lactose, Other, Biaxin [clarithromycin], Hydrocodone -acetaminophen , Oxycodone , Sulfa antibiotics, and Sulfasalazine   Review of Systems Review of Systems  All other systems reviewed and are negative.    Physical Exam Triage Vital Signs ED Triage Vitals  Encounter Vitals Group     BP      Girls Systolic BP Percentile      Girls Diastolic BP Percentile      Boys Systolic BP Percentile      Boys Diastolic BP Percentile      Pulse      Resp      Temp      Temp src      SpO2      Weight      Height      Head Circumference      Peak Flow      Pain Score      Pain Loc      Pain Education      Exclude from Growth Chart    No data found.  Updated Vital Signs There were no vitals taken for this visit.  Visual Acuity Right Eye Distance:   Left Eye Distance:   Bilateral Distance:    Right Eye Near:   Left Eye Near:    Bilateral Near:     Physical Exam Constitutional:      Appearance: Normal appearance.  HENT:     Right Ear: Tympanic membrane, ear canal and external ear normal.     Left Ear: Tympanic membrane, ear canal and external ear normal.     Nose: Congestion present.     Mouth/Throat:     Pharynx: No oropharyngeal exudate or posterior oropharyngeal erythema.   Eyes:     Extraocular Movements: Extraocular movements intact.  Cardiovascular:     Rate and Rhythm: Normal rate and regular rhythm.     Pulses: Normal pulses.     Heart sounds: Normal heart sounds.  Pulmonary:     Effort: Pulmonary effort is normal.     Breath sounds: Wheezing present.  Neurological:     Mental Status: She is alert and oriented to person, place, and time.      UC Treatments / Results  Labs (all labs ordered are listed, but only abnormal results are displayed) Labs Reviewed - No data to display  EKG   Radiology No results found.  Procedures Procedures (including critical care time)  Medications Ordered in UC Medications - No data to display  Initial Impression / Assessment and Plan / UC Course  I have reviewed the triage vital signs and the nursing notes.  Pertinent labs & imaging results that were available during my care of the patient were reviewed by me and considered in my medical decision making (see chart for details).  COPD exacerbation  Vital signs are stable and patient is in no signs of distress nontoxic-appearing, wheezing heard to auscultation, O2 saturation greater than 90% on room air,  stable for outpatient management, COVID and flu testing negative, discussed findings with patient and daughter, etiology most likely exacerbation of COPD, discussed, prescribed doxycycline  and prednisone , declined cough medicine recommended over-the-counter medications with nonpharmacological supportive care with follow-up with urgent care as needed Final Clinical Impressions(s) / UC Diagnoses   Final diagnoses:  None   Discharge Instructions   None    ED Prescriptions   None    PDMP not reviewed this encounter.     [1]  Social History Tobacco Use   Smoking status: Never   Smokeless tobacco: Never  Vaping Use   Vaping status: Never Used  Substance Use Topics   Alcohol use: No   Drug use: No     Teresa Shelba SAUNDERS, NP 01/18/24  1804

## 2024-01-23 ENCOUNTER — Telehealth: Payer: Self-pay | Admitting: Internal Medicine

## 2024-01-23 ENCOUNTER — Ambulatory Visit: Admitting: Internal Medicine

## 2024-01-23 NOTE — Telephone Encounter (Signed)
 Called and spoke with [name], patient's daughter, per DPR.  [name] states new onset of hallucinations for her mom.  She states that she is not sure if the patient has a UTI and has an appointment with PCP today at 1430, and then an office visit with Dr. Mady today at 1520.  Patient advised to keep both appointments.  [name] verbalized agreement with going to the PCP to have lab work done to rule out UTI and discuss the symptoms.  Then she will come for her office visit with Dr. Mady to see if the symptoms may be related to heart medications started after the hospital stay.  [name] expressed gratitude for the call.  [name] worried that she might be late for the office visit with Dr. Mady.  Advised that Dr. Mady and his nurse will be made aware of the situation.

## 2024-01-23 NOTE — Telephone Encounter (Signed)
 Pt c/o medication issue:  1. Name of Medication: diltiazem  (CARDIZEM  CD) 240 MG 24 hr capsule  metoprolol  tartrate (LOPRESSOR ) 25 MG tablet  rosuvastatin  (CRESTOR ) 5 MG tablet   2. How are you currently taking this medication (dosage and times per day)? As written   3. Are you having a reaction (difficulty breathing--STAT)? No  4. What is your medication issue? Pts daughter states these medication might be causing the pt to have hallucinations.

## 2024-01-23 NOTE — Progress Notes (Deleted)
" °  Cardiology Office Note:  .   Date:  01/23/2024  ID:  Courtney Wilcox, DOB 09-15-1945, MRN [id number] PCP: Marikay Eva POUR, PA  [facility] Providers Cardiologist:  Lonni Hanson, MD Electrophysiologist:  OLE ONEIDA HOLTS, MD { Click to update primary MD,subspecialty MD or APP then REFRESH:1}    History of Present Illness: .   Courtney Wilcox is a 79 y.o. 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  She was last seen in our office in November by Medford Meager, NP, at which time she was noted to have worsening dyspnea, including a choking sensation in her upper airway that improved with corticosteroids.  She also noted mild retrosternal chest pressure and heaviness at times.  She was hospitalized last month with chest pain and found to have recurrent pulmonary embolism.  She was treated with heparin  infusion and subsequently underwent right and left heart catheterization, which showed widely patent ostial and proximal LAD stent with minimal in-stent restenosis.  Proximal RCA disease had progressed from 30% to 50-60% but was not flow-limiting.  PCWP was normal with borderline pulmonary hypertension (PA 30/10, mean 20 mmHg).  Cardiac output/index was normal.  No intervention was pursued.  Due to episodes of PSVT noted on telemetry, the patient was started on metoprolol .  ROS: See HPI  Studies Reviewed: SABRA        R/LHC (12/27/2023): LMCA normal.  Ostial/proximal LAD stent widely patent.  Proximal RCA with 50-60% stenosis.  Mean RA 6, PCWP 12, PA 37/10 (20).  LVEDP 15.  Fick CO/CI 5.0/2.6.  TTE (12/25/2023): Technically challenging study.  Normal LV size with moderate LVH.   LVEF greater than 55% with grade 1 diastolic dysfunction.  Unable to assess regional wall motion.  Normal RV size with moderate RVH and moderately reduced RV contraction.  Normal biatrial size.  Trivial pericardial effusion.  Trivial TR.  Risk Assessment/Calculations:   {Does this patient have ATRIAL FIBRILLATION?[phone]} No BP recorded.  {Refresh Note OR Click here to enter BP  :1}***       Physical Exam:   VS:  There were no vitals taken for this visit.   Wt Readings from Last 3 Encounters:  12/23/23 199 lb 15.3 oz (90.7 kg)  12/22/23 200 lb (90.7 kg)  11/30/23 198 lb 2 oz (89.9 kg)    General:  NAD. Neck: No JVD or HJR. Lungs: Clear to auscultation bilaterally without wheezes or crackles. Heart: Regular rate and rhythm without murmurs, rubs, or gallops. Abdomen: Soft, nontender, nondistended. Extremities: No lower extremity edema.  ASSESSMENT AND PLAN: .    ***    {Are you ordering a CV Procedure (e.g. stress test, cath, DCCV, TEE, etc)?   Press F2        :[id number]}  Dispo: ***  Signed, Lonni Hanson, MD  "

## 2024-01-23 NOTE — Telephone Encounter (Signed)
 Daughter Dairl) returned RN's call.

## 2024-01-23 NOTE — Telephone Encounter (Addendum)
 Pts daughter was made aware of MDs recommendations and agreed to reschedule the appointment. However, she wanted to inquire whether Dr. Mady feels that the hallucinations could be a side effect of any of the pts current cardiac medications.  Will forward to MD and pharm D for recommendations Appointment rescheduled for 01/30/24

## 2024-01-23 NOTE — Telephone Encounter (Signed)
Pt's daughter made aware and verbalized understanding.

## 2024-01-23 NOTE — Telephone Encounter (Signed)
 I don't believe any of her cardiac medications are causing the hallucinations.  However, several other medications on her list, including pain medications, diphenhydramine , and prednisone  could be contributing to hallucinations.  Lonni Hanson, MD [facility]

## 2024-01-23 NOTE — Telephone Encounter (Signed)
 If the patient is having hallucinations with concerns for UTI, it would be best for her to see her PCP as soon as possible or go to the ED; we should reschedule her follow-up with us  for another day.  Lonni Hanson, MD [facility]

## 2024-01-23 NOTE — Telephone Encounter (Signed)
 Left a message for the patient to call back.

## 2024-01-30 ENCOUNTER — Encounter: Payer: Self-pay | Admitting: Internal Medicine

## 2024-01-30 ENCOUNTER — Ambulatory Visit: Attending: Internal Medicine | Admitting: Internal Medicine

## 2024-01-30 VITALS — BP 100/58 | HR 65 | Ht 64.0 in | Wt 199.0 lb

## 2024-01-30 DIAGNOSIS — I2584 Coronary atherosclerosis due to calcified coronary lesion: Secondary | ICD-10-CM | POA: Diagnosis not present

## 2024-01-30 DIAGNOSIS — I5032 Chronic diastolic (congestive) heart failure: Secondary | ICD-10-CM | POA: Diagnosis present

## 2024-01-30 DIAGNOSIS — I2699 Other pulmonary embolism without acute cor pulmonale: Secondary | ICD-10-CM | POA: Diagnosis present

## 2024-01-30 DIAGNOSIS — E785 Hyperlipidemia, unspecified: Secondary | ICD-10-CM | POA: Insufficient documentation

## 2024-01-30 DIAGNOSIS — R002 Palpitations: Secondary | ICD-10-CM

## 2024-01-30 DIAGNOSIS — R079 Chest pain, unspecified: Secondary | ICD-10-CM | POA: Diagnosis not present

## 2024-01-30 DIAGNOSIS — I2510 Atherosclerotic heart disease of native coronary artery without angina pectoris: Secondary | ICD-10-CM | POA: Insufficient documentation

## 2024-01-30 DIAGNOSIS — I10 Essential (primary) hypertension: Secondary | ICD-10-CM

## 2024-01-30 DIAGNOSIS — I4719 Other supraventricular tachycardia: Secondary | ICD-10-CM

## 2024-01-30 DIAGNOSIS — I471 Supraventricular tachycardia, unspecified: Secondary | ICD-10-CM | POA: Diagnosis present

## 2024-01-30 MED ORDER — DILTIAZEM HCL ER COATED BEADS 240 MG PO CP24: 240.0000 mg | ORAL_CAPSULE | Freq: Every day | ORAL | 3 refills | Status: AC

## 2024-01-30 MED ORDER — ROSUVASTATIN CALCIUM 5 MG PO TABS: 5.0000 mg | ORAL_TABLET | Freq: Every day | ORAL | 3 refills | Status: AC

## 2024-01-30 NOTE — Progress Notes (Signed)
 " Cardiology [facility] Note:  .   Date:  01/30/2024  ID:  Courtney Wilcox, DOB [birth year], MRN [id number] PCP: Marikay Eva POUR, PA  [facility] Providers Cardiologist:  Lonni Hanson, MD Electrophysiologist:  OLE ONEIDA HOLTS, MD (Inactive)     History of Present Illness: .   Courtney [facility] is a 79 y.o. 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  She was last seen in our [facility] in November by Medford Meager, NP, at which time she was noted to have worsening dyspnea, including a choking sensation in her upper airway that improved with corticosteroids.  She also noted mild retrosternal chest pressure and heaviness at times.  She was hospitalized last month with chest pain and found to have recurrent pulmonary embolism.  She was treated with heparin  infusion and subsequently underwent right and left heart catheterization, which showed widely patent ostial and proximal LAD stent with minimal in-stent restenosis.  Proximal RCA disease had progressed from 30% to 50-60% but was not flow-limiting.  PCWP was normal with borderline pulmonary hypertension (PA 30/10, mean 20 mmHg).  Cardiac output/index was normal.  No intervention was pursued.  Due to episodes of PSVT noted on telemetry, the patient was started on metoprolol .  Since her hospitalization in December, Courtney Wilcox noted persistent shortness of breath.  Repeat CXR by her PCP showed a persistent opacity at the left lung base, where a small pleural effusion was noted on prior cross-sectional imaging.  She was started on antibiotics and has noticed some improvement.  Courtney Wilcox as concerned about confusion during this time and  wonders if it could have been related to any of her cardiac medications.  Last week, Courtney Wilcox was also nauseated and achy with concerns for UTI.  However, labs through Courtney [facility] did not show evidence of a UTI.  She was using ondansetron  on a regular basis, which in hindsight may have been contributing to her confusion.  Courtney [facility] denies chest pain and palpitations.  Courtney has not been tolerating ezetimibe  well due to diarrhea.  Atorvastatin  was previously discontinued due to abnormal LFT's.  She notes some LE edema.  Her breathing is back to her baseline.  ROS: See HPI  Studies Reviewed: SABRA   EKG Interpretation Date/Time:  Wednesday January 30 2024 15:37:59 EST Ventricular Rate:  65 PR Interval:  160 QRS Duration:  98 QT Interval:  442 QTC Calculation: 459 R Axis:   -23  Text Interpretation: Normal sinus rhythm with sinus arrhythmia Normal ECG When compared with ECG of 27-Dec-2023 12:41, Premature atrial complexes are no longer Present Vent. rate has decreased BY  57 BPM Confirmed by Katieann Hungate [phone]) on 01/30/2024 3:57:46 PM    R/LHC (12/27/2023): LMCA normal.  Ostial/proximal LAD stent widely patent.  Proximal RCA with 50-60% stenosis.  Mean RA 6, PCWP 12, PA 37/10 (20).  LVEDP 15.  Fick CO/CI 5.0/2.6.  TTE (12/25/2023): Technically challenging study.  Normal LV size with moderate LVH.  LVEF greater than 55% with grade 1 diastolic dysfunction.  Unable to assess regional wall motion.  Normal RV size with moderate RVH and moderately reduced RV contraction.  Normal biatrial size.  Trivial pericardial effusion.  Trivial TR.  Risk Assessment/Calculations:  Physical Exam:   VS:  BP (!) 100/58 (BP Location: Left Arm, Patient Position: Sitting, Cuff Size: Normal)   Pulse 65   Ht 5' 4 (1.626 m)   Wt 199 lb (90.3 kg)   SpO2 95%   BMI 34.16 kg/m    Wt Readings from Last 3 Encounters:  01/30/24 199 lb (90.3 kg)  12/23/23 199 lb 15.3 oz (90.7 kg)  12/22/23 200 lb  (90.7 kg)    General:  NAD.  Accompanied by her [facility]. Neck: No gross JVD, though body habitus limits evaluation. Lungs: Mildly diminished breath sounds at the lung bases. Heart: Regular rate and rhythm without murmurs, rubs, or gallops. Abdomen: Soft, nontender, nondistended. Extremities: Trace pretibial edema.  ASSESSMENT AND PLAN: .    Coronary artery disease: No significant chest pain noted since hospitalization last month in the setting of acute pulmonary embolism.  Catheterization at that time showed widely patent LAD stent and moderate RCA disease that had progressed from prior catheterization but was not obstructive.  We will continue secondary prevention with apixaban  in lieu of aspirin  in the setting of recent DVT/PE.  She was recently started on rosuvastatin  5 mg daily, with follow-up LFT's yesterday remaining normal.  Chronic HFpEF: Mild LE edema noted on exam.  Recent RHC showed normal left and mildly elevated right heart/pulmonary artery pressures in the setting of acute PE.  We will defer standing diuretic given normal left-sided filling pressures and marginal renal function.  Courtney [facility] may have an element of post-thrombotic syndrome following her recent VTE.  Given borderline low BP, we will stop metoprolol .  PSVT: No palpitations reported.  Given borderline low blood pressure, we will stop metoprolol  but continue diltiazem  ER 240 mg daily.  PE: Continue indefinite anticoagulation with apixaban  5 mg twice daily.  Hyperlipidemia: We will discontinue ezetimibe  due to associated diarrhea.  Continue rosuvastatin  5 mg daily; recent LFT's normal (patient has history of transaminitis with atorvastatin ).  We will plan to follow-up lipids in 1-2 months.    Dispo: Return to clinic in 2 months.  Signed, Lonni Hanson, MD  "

## 2024-01-30 NOTE — Patient Instructions (Signed)
 Medication Instructions:  Your physician recommends the following medication changes.  STOP TAKING: Metoprolol  Zetia   *If you need a refill on your cardiac medications before your next appointment, please call your pharmacy*  Lab Work: No labs ordered today    Testing/Procedures: No test ordered today   Follow-Up: At [facility], you and your health needs are our priority.  As part of our continuing mission to provide you with exceptional heart care, our providers are all part of one team.  This team includes your primary Cardiologist (physician) and Advanced Practice Providers or APPs (Physician Assistants and Nurse Practitioners) who all work together to provide you with the care you need, when you need it.  Your next appointment:   2 month(s)  Provider:   You may see Lonni Hanson, MD or one of the following Advanced Practice Providers on your designated Care Team:   Lonni Meager, NP Lesley Maffucci, PA-C Bernardino Bring, PA-C Cadence Rincon, PA-C Tylene Lunch, NP Barnie Hila, NP

## 2024-02-01 ENCOUNTER — Encounter: Payer: Self-pay | Admitting: Internal Medicine

## 2024-02-04 ENCOUNTER — Other Ambulatory Visit (HOSPITAL_BASED_OUTPATIENT_CLINIC_OR_DEPARTMENT_OTHER): Payer: Self-pay

## 2024-02-05 ENCOUNTER — Ambulatory Visit: Admitting: Pulmonary Disease

## 2024-02-05 ENCOUNTER — Encounter: Payer: Self-pay | Admitting: Pulmonary Disease

## 2024-02-05 VITALS — BP 126/84 | HR 78 | Temp 98.4°F | Ht 64.0 in | Wt 199.0 lb

## 2024-02-05 DIAGNOSIS — J189 Pneumonia, unspecified organism: Secondary | ICD-10-CM | POA: Diagnosis not present

## 2024-02-05 DIAGNOSIS — D7219 Other eosinophilia: Secondary | ICD-10-CM | POA: Diagnosis not present

## 2024-02-05 DIAGNOSIS — I87009 Postthrombotic syndrome without complications of unspecified extremity: Secondary | ICD-10-CM

## 2024-02-05 DIAGNOSIS — J45909 Unspecified asthma, uncomplicated: Secondary | ICD-10-CM | POA: Diagnosis not present

## 2024-02-05 DIAGNOSIS — I2699 Other pulmonary embolism without acute cor pulmonale: Secondary | ICD-10-CM | POA: Diagnosis not present

## 2024-02-05 DIAGNOSIS — G4733 Obstructive sleep apnea (adult) (pediatric): Secondary | ICD-10-CM | POA: Diagnosis not present

## 2024-02-05 DIAGNOSIS — J8283 Eosinophilic asthma: Secondary | ICD-10-CM

## 2024-02-05 DIAGNOSIS — Z9189 Other specified personal risk factors, not elsewhere classified: Secondary | ICD-10-CM

## 2024-02-05 DIAGNOSIS — R0600 Dyspnea, unspecified: Secondary | ICD-10-CM

## 2024-02-05 DIAGNOSIS — D649 Anemia, unspecified: Secondary | ICD-10-CM

## 2024-02-05 MED ORDER — BUDESONIDE 0.25 MG/2ML IN SUSP: 0.2500 mg | Freq: Two times a day (BID) | RESPIRATORY_TRACT | 11 refills | Status: AC

## 2024-02-05 NOTE — Patient Instructions (Signed)
 VISIT SUMMARY:  During your visit, we discussed your ongoing issues with shortness of breath, asthma, COPD, and recent pulmonary embolism and pneumonia. We reviewed your current medications and treatment history, and we have made some adjustments to better manage your symptoms. We also discussed your mild sleep apnea and postthrombotic syndrome.  YOUR PLAN:  -ASTHMA WITH PULMONARY EOSINOPHILIA: This condition involves inflammation and narrowing of the airways, often triggered by allergies, leading to symptoms like shortness of breath and wheezing. We will continue your levalbuterol  inhaler as needed and add Pulmicort  (budesonide ) nebulizer solution twice daily. We have also ordered a flow volume loop test to evaluate your vocal cord function and a high resolution CT scan to assess your lung condition. Additionally, breathing tests will be conducted to assess your lung function.  -PULMONARY EMBOLISM: This is a condition where blood clots block the arteries in the lungs. You will continue taking Eliquis  for anticoagulation to help resolve the clots. No further imaging is required at this time.  -PNEUMONIA: This is an infection that inflames the air sacs in one or both lungs. Your recent pneumonia was treated with antibiotics, and your symptoms are improving. We have ordered a high resolution CT scan to check for any residual pneumonia or scarring.  -OBSTRUCTIVE SLEEP APNEA: This condition causes breathing to repeatedly stop and start during sleep. We have ordered a repeat sleep study, which can be done at home or in a facility, to reassess your condition.  -POSTTHROMBOTIC SYNDROME: This condition occurs after a blood clot in the leg, leading to symptoms like leg swelling and discoloration. We recommend elevating your legs to reduce swelling and have referred you to a vascular specialist or cardiologist for further evaluation and management.  INSTRUCTIONS:  Please follow up with the ordered tests,  including the flow volume loop test, high resolution CT scan, breathing tests, and repeat sleep study. Continue taking your medications as prescribed. Elevate your legs to reduce swelling and follow up with the vascular specialist or cardiologist as recommended.

## 2024-02-05 NOTE — Progress Notes (Unsigned)
 "  Subjective:    Patient ID: Courtney [facility], female    DOB: [birth year], 79 y.o.   MRN: [id number]  Patient Care Team: Marikay Eva POUR, PA as PCP - General (Physician Assistant) Cindie Ole DASEN, MD (Inactive) as PCP - Electrophysiology (Cardiology) End, Lonni, MD as PCP - Cardiology (Cardiology) Babara Call, MD as Consulting Physician (Oncology)  Chief Complaint  Patient presents with   Consult    Re-establish with a new pulmonologist.  SOB has been a problem for many years.    BACKGROUND:   HPI    Review of Systems A 10 point review of systems was performed and it is as noted above otherwise negative.   Past Medical History:  Diagnosis Date   Allergic rhinitis    Anxiety    Arthritis    Asthma    Atrophic vaginitis    B12 deficiency    Bronchitis, chronic (HCC)    CAD (coronary artery disease)    a. 03/2003 Cath: Nl cors; b. 09/2022 Staged PCI: LM nl, LAD 85p/24m (3.5x26 Onyx Frontier DES), RI small, mild dzs, LCX nl, OM2 mild dzs, RCA 30p.   Cervical disc disease    CKD (chronic kidney disease), stage III (HCC)    Collagen vascular disease    RA   COPD (chronic obstructive pulmonary disease) (HCC)    Cough - post-COVID    Depression    Diastolic dysfunction    a. 11/2020 Echo: EF 45-50%, GrI DD; b. 10/2021 Echo: EF 60-65%, GrII DD; c. 09/2022 Echo: EF 55-60%, no rwma, GrI DD, nl RV fxn, mildly dil LA. No significant valvular dzs; d. 04/2023 Echo: EF 60-65% no rwma, mild LVH, nl RV fxn, RVSP 23.49mmHg. AoV sclerosis.   Fibrocystic breast disease    Foot ulcer (HCC)    GERD (gastroesophageal reflux disease)    Hammer toe    Hyperlipidemia    IBS (irritable bowel syndrome)    Macrocytic anemia    Macular degeneration 2023   Nausea and vomiting 12/23/2023   Obesity    OSA (obstructive sleep apnea)    Osteoarthrosis    Multi site   Osteopenia    PSVT (paroxysmal supraventricular tachycardia)    a. 04/2022 Zio: Sinus rhythm  @ 57 (42-101). Rare PACs/PVCs. 74290 SVT runs lasting up to 69m 12sec - max rate 222. 5 beats NSVT. Triggered events = sinus, SVT, PACs.   Pulmonary embolism (HCC)    Rheumatoid arthritis (HCC)    Shingles    Thyroid  nodule    MULTIPLE   Tonsillitis    recurrent   Umbilical hernia     Past Surgical History:  Procedure Laterality Date   BACK SURGERY  2011   LAMINECTOMY   BREAST BIOPSY Right    neg   CERVICAL FUSION  2011, 2012   X 2   COLONOSCOPY  2005, 2015   COLONOSCOPY WITH PROPOFOL  N/A 08/04/2020   Procedure: COLONOSCOPY WITH PROPOFOL ;  Surgeon: Dessa Reyes ORN, MD;  Location: [facility] ENDOSCOPY;  Service: Endoscopy;  Laterality: N/A;   CORONARY STENT INTERVENTION N/A 10/02/2022   Procedure: CORONARY STENT INTERVENTION;  Surgeon: Darron Deatrice LABOR, MD;  Location: [facility] INVASIVE [facility];  Service: Cardiovascular;  Laterality: N/A;   ESOPHAGOGASTRODUODENOSCOPY (EGD) WITH PROPOFOL  N/A 09/26/2021   Procedure: ESOPHAGOGASTRODUODENOSCOPY (EGD) WITH PROPOFOL ;  Surgeon: Onita Elspeth Sharper, DO;  Location[facility];  Service: Gastroenterology;  Laterality: N/A;   ESOPHAGOGASTRODUODENOSCOPY ENDOSCOPY  2005, 2013, 2015   EYE SURGERY Bilateral    Cataract  Extraction with IOL    HERNIA REPAIR     JOINT REPLACEMENT     KNEE ARTHROPLASTY Left 07/12/2016   Procedure: COMPUTER ASSISTED TOTAL KNEE ARTHROPLASTY;  Surgeon: Mardee Lynwood SQUIBB, MD;  Location: [facility] ORS;  Service: Orthopedics;  Laterality: Left;   Laryngeal tear after intubation w/repair     LUMBAR DISC SURGERY  12/2009   REPLACEMENT TOTAL KNEE Right 06/2007   DR. HOOTEN, [facility]   REPLACEMENT UNICONDYLAR JOINT KNEE     DR. HOOTEN, [facility]   RIGHT/LEFT HEART CATH AND CORONARY ANGIOGRAPHY N/A 09/29/2022   Procedure: RIGHT/LEFT HEART CATH AND CORONARY ANGIOGRAPHY;  Surgeon: Darron Deatrice LABOR, MD;  Location: [facility] IN[facility]IVE CV [facility];  Service: Cardiovascular;  Laterality: N/A;   RIGHT/LEFT HEART CATH AND CORONARY  ANGIOGRAPHY N/A 12/27/2023   Procedure: RIGHT/LEFT HEART CATH AND CORONARY ANGIOGRAPHY;  Surgeon: Anner Alm ORN, MD;  Location: [facility] CV [facility];  Service: Cardiovascular;  Laterality: N/A;   SHOULDER ARTHROSCOPY WITH ROTATOR CUFF REPAIR Right    TONSILLECTOMY     TOTAL HIP ARTHROPLASTY Right 01/31/2017   Procedure: TOTAL HIP ARTHROPLASTY;  Surgeon: Mardee Lynwood SQUIBB, MD;  Location: [facility] ORS;  Service: Orthopedics;  Laterality: Right;   TUBAL LIGATION     UPPER GI ENDOSCOPY     VENTRAL HERNIA REPAIR N/A 09/04/2023   Procedure: REPAIR, HERNIA, VENTRAL;  Surgeon: Marinda Jayson KIDD, MD;  Location: [facility] ORS;  Service: General;  Laterality: N/A;    Patient Active Problem List   Diagnosis Date Noted   Pulmonary embolus (HCC) 12/23/2023   COPD (chronic obstructive pulmonary disease) (HCC) 12/23/2023   HTN (hypertension) 12/23/2023   Chronic kidney disease, stage 3a (HCC) 12/23/2023   Nausea and vomiting 12/23/2023   Contusion of chest wall, left, subsequent encounter 12/23/2023   OSA (obstructive sleep apnea) 09/06/2023   Hyperkalemia 09/06/2023   Incarcerated ventral hernia 09/04/2023   Acute respiratory failure with hypoxia (HCC) 04/25/2023   NSTEMI (non-ST elevated myocardial infarction) (HCC) 04/25/2023   SIRS (systemic inflammatory response syndrome) (HCC) 04/25/2023   Respiratory infection 04/25/2023   Sepsis (HCC) 04/25/2023   CAD S/P percutaneous coronary angioplasty 09/30/2022   Class 1 obesity due to excess calories with body mass index (BMI) of 34.0 to 34.9 in adult 09/28/2022   Unstable angina (HCC) 09/28/2022   Coronary artery disease due to calcified coronary lesion 09/28/2022   CKD stage 3b, GFR 30-44 ml/min (HCC) 09/28/2022   GERD without esophagitis 09/28/2022   History of pulmonary embolism 01/20/2022   Anemia in CKD (chronic kidney disease) 12/09/2021   LVH (left ventricular hypertrophy) due to hypertensive disease, without heart failure  09/15/2021   PSVT (paroxysmal supraventricular tachycardia) 11/19/2020   History of atrial flutter 11/19/2020   Chronic venous insufficiency 08/15/2020   Carotid stenosis 02/04/2020   Hyperlipidemia LDL goal <70 02/04/2020   Multiple thyroid  nodules 05/21/2018   Thrombocytopenia 07/31/2017   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/2017   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   Primary osteoarthritis of right hip 12/13/2016   S/P total knee arthroplasty 07/12/2016   Anemia of chronic disease 10/30/2013   Anxiety 10/30/2013   Osteoarthritis 07/08/2013   Osteopenia 07/08/2013   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   Chest pain 02/19/2013   Rheumatoid arthritis (HCC) 10/30/2012   Shortness of breath 10/29/2012   Spinal stenosis in cervical region 10/29/2012    Family History  Problem Relation Age of Onset   Asthma Mother    Breast cancer Mother 40  Emphysema Father        smoked   Lung cancer Father        smoked   Coronary artery disease Father 1    Social History   Tobacco Use   Smoking status: Never   Smokeless tobacco: Never  Substance Use Topics   Alcohol use: No    Allergies[1]  Active Medications[2]  Immunization History  Administered Date(s) Administered   INFLUENZA, HIGH DOSE SEASONAL PF 12/05/2017   Influenza Inj Mdck Quad Pf 12/05/2017, 12/29/2020, 11/03/2021   Influenza Split 09/16/2012   PFIZER(Purple Top)SARS-COV-2 Vaccination 04/05/2019, 05/14/2019   PPD Test 12/29/2013        Objective:     There were no vitals filed for this visit.      GENERAL: HEAD: Normocephalic, atraumatic.  EYES: Pupils equal, round, reactive to light.  No scleral icterus.  MOUTH:  NECK: Supple. No thyromegaly. Trachea midline. No JVD.  No adenopathy. PULMONARY: Good air entry bilaterally.  No adventitious sounds. CARDIOVASCULAR: S1 and S2. Regular rate and rhythm.  ABDOMEN: MUSCULOSKELETAL:  No joint deformity, no clubbing, no edema.  NEUROLOGIC:  SKIN: Intact,warm,dry. PSYCH:        Assessment & Plan:   No diagnosis found.  No orders of the defined types were placed in this encounter.   No orders of the defined types were placed in this encounter.    Advised if symptoms do not improve or worsen, to please contact office for sooner follow up or seek emergency care.    I spent xxx minutes of dedicated to the care of this patient on the date of this encounter to include pre-visit review of records, face-to-face time with the patient discussing conditions above, post visit ordering of testing, clinical documentation with the electronic health record, making appropriate referrals as documented, and communicating necessary findings to members of the patients care team.   C. Leita Sanders, MD Advanced Bronchoscopy PCCM [facility]    *This note was dictated using voice recognition software/Dragon.  Despite best efforts to proofread, errors can occur which can change the meaning. Any transcriptional errors that result from this process are unintentional and may not be fully corrected at the time of dictation.     [1] Allergies Allergen Reactions   Amiodarone  Other (See Comments)    Fatigue, Nausea, anorexia, malaise   Lactose Other (See Comments) and Diarrhea   Other Other (See Comments)    Pt ONLY tolerates SLOW-FE (slow released iron )---upsets IBS   Biaxin [Clarithromycin] Other (See Comments)    GI upset    Hydrocodone -Acetaminophen  Itching    Tolerates acetaminophen     Oxycodone  Itching   Sulfa Antibiotics Diarrhea and Other (See Comments)    GI upset   Sulfasalazine Diarrhea  [2] No outpatient medications have been marked as taking for the 02/05/24 encounter (Office Visit) with Sanders Dedra CROME, MD.  "

## 2024-02-06 ENCOUNTER — Encounter: Payer: Self-pay | Admitting: Pulmonary Disease

## 2024-02-06 DIAGNOSIS — I87011 Postthrombotic syndrome with ulcer of right lower extremity: Secondary | ICD-10-CM

## 2024-02-06 NOTE — Telephone Encounter (Signed)
 Yes please, place referral, diagnosis: Postphlebitic syndrome, right lower extremity

## 2024-02-07 ENCOUNTER — Other Ambulatory Visit (INDEPENDENT_AMBULATORY_CARE_PROVIDER_SITE_OTHER): Payer: Self-pay | Admitting: Nurse Practitioner

## 2024-02-07 DIAGNOSIS — I824Z9 Acute embolism and thrombosis of unspecified deep veins of unspecified distal lower extremity: Secondary | ICD-10-CM

## 2024-02-08 ENCOUNTER — Ambulatory Visit (INDEPENDENT_AMBULATORY_CARE_PROVIDER_SITE_OTHER): Admitting: Nurse Practitioner

## 2024-02-08 ENCOUNTER — Encounter (INDEPENDENT_AMBULATORY_CARE_PROVIDER_SITE_OTHER): Payer: Self-pay | Admitting: Nurse Practitioner

## 2024-02-08 ENCOUNTER — Ambulatory Visit (INDEPENDENT_AMBULATORY_CARE_PROVIDER_SITE_OTHER)

## 2024-02-08 VITALS — BP 134/75 | HR 76 | Resp 14 | Ht 64.0 in | Wt 200.2 lb

## 2024-02-08 DIAGNOSIS — I824Z9 Acute embolism and thrombosis of unspecified deep veins of unspecified distal lower extremity: Secondary | ICD-10-CM

## 2024-02-08 DIAGNOSIS — I87009 Postthrombotic syndrome without complications of unspecified extremity: Secondary | ICD-10-CM

## 2024-02-08 DIAGNOSIS — I82463 Acute embolism and thrombosis of calf muscular vein, bilateral: Secondary | ICD-10-CM | POA: Diagnosis not present

## 2024-02-11 NOTE — Progress Notes (Signed)
 "  Subjective:    Patient ID: Courtney Wilcox, female    DOB: 1945-05-23, 79 y.o.   MRN: [id number] No chief complaint on file.   HPI  Discussed the use of AI scribe software for clinical note transcription with the patient, who gave verbal consent to proceed.  History of Present Illness CLARYCE FRIEL is a 79 year old female with recurrent venous thromboembolism who presents for evaluation of persistent right lower extremity swelling and pain following recent deep vein thrombosis and pulmonary embolism.  In December, she sustained a fall onto her left side, after which she was diagnosed with pulmonary embolism and bilateral lower extremity deep vein thrombosis. She was initiated on apixaban  at that time. Since then, she has experienced persistent swelling and intermittent erythema of the right lower extremity, primarily from the knee down, with episodic increases in discomfort. Over the past several days, she has developed new pain in the right thigh and groin. Prior to this event, she had no history of lower extremity swelling. She elevates her legs and remains active, but finds compression stockings uncomfortable and sometimes excessively tight. She ambulates independently and has not observed any new wounds on the right leg. A right knee injury from the fall has since resolved.  She has a history of multiple episodes of venous thromboembolism, including a prior pulmonary embolism in 2022 or 2023 treated with apixaban  for approximately one year. Following cardiac catheterization and stent placement, she was transitioned to clopidogrel  for one year. She also experienced a lower extremity deep vein thrombosis during pregnancy, requiring hospitalization.  In August, she underwent emergency hernia repair, complicated by a large postoperative hematoma and increased dyspnea. Computed tomography of the chest at that time did not reveal pulmonary embolism. Since September, she has been sleeping in a  recliner due to orthopnea and difficulty lying flat.  She also experiences restless legs syndrome, managed with gabapentin , with breakthrough symptoms at night. She adjusts her dose between 100 mg and 300 mg depending on severity and sedation, and describes significant leg movements and discomfort during episodes.    Results Radiology Lower extremity venous ultrasound (12/26/2023): Acute deep vein thrombosis in bilateral calves; no evidence of new thrombus on follow-up; chronic post-thrombotic changes in right leg, currently resolved. Chest X-ray (12/26/2023): Opacity in lower left lobe; possible pleural effusion or pneumonia.   Review of Systems  Cardiovascular:  Positive for leg swelling.  All other systems reviewed and are negative.      Objective:   Physical Exam Vitals reviewed.  HENT:     Head: Normocephalic.  Cardiovascular:     Rate and Rhythm: Normal rate.  Pulmonary:     Effort: Pulmonary effort is normal.  Musculoskeletal:     Right lower leg: Edema present.     Left lower leg: Edema present.  Skin:    General: Skin is warm.  Neurological:     Mental Status: She is alert and oriented to person, place, and time.  Psychiatric:        Mood and Affect: Mood normal.        Behavior: Behavior normal.        Thought Content: Thought content normal.        Judgment: Judgment normal.     Physical Exam    BP 134/75   Pulse 76   Resp 14   Ht 5' 4 (1.626 m)   Wt 200 lb 3.2 oz (90.8 kg)   BMI 34.36 kg/m   Past  Medical History:  Diagnosis Date   Allergic rhinitis    Anxiety    Arthritis    Asthma    Atrophic vaginitis    B12 deficiency    Bronchitis, chronic (HCC)    CAD (coronary artery disease)    a. 03/2003 Cath: Nl cors; b. 09/2022 Staged PCI: LM nl, LAD 85p/30m (3.5x26 Onyx Frontier DES), RI small, mild dzs, LCX nl, OM2 mild dzs, RCA 30p.   Cervical disc disease    CKD (chronic kidney disease), stage III (HCC)    Collagen vascular disease    RA    COPD (chronic obstructive pulmonary disease) (HCC)    Cough - post-COVID    Depression    Diastolic dysfunction    a. 11/2020 Echo: EF 45-50%, GrI DD; b. 10/2021 Echo: EF 60-65%, GrII DD; c. 09/2022 Echo: EF 55-60%, no rwma, GrI DD, nl RV fxn, mildly dil LA. No significant valvular dzs; d. 04/2023 Echo: EF 60-65% no rwma, mild LVH, nl RV fxn, RVSP 23.48mmHg. AoV sclerosis.   Fibrocystic breast disease    Foot ulcer (HCC)    GERD (gastroesophageal reflux disease)    Hammer toe    Hyperlipidemia    IBS (irritable bowel syndrome)    Macrocytic anemia    Macular degeneration 2023   Nausea and vomiting 12/23/2023   Obesity    OSA (obstructive sleep apnea)    Osteoarthrosis    Multi site   Osteopenia    PSVT (paroxysmal supraventricular tachycardia)    a. 04/2022 [facility]: Sinus rhythm @ 57 (42-101). Rare PACs/PVCs. 74290 SVT runs lasting up to 34m 12sec - max rate 222. 5 beats NSVT. Triggered events = sinus, SVT, PACs.   Pulmonary embolism (HCC)    Rheumatoid arthritis (HCC)    Shingles    Thyroid  nodule    MULTIPLE   Tonsillitis    recurrent   Umbilical hernia     Social History   Socioeconomic History   Marital status: Widowed    Spouse name: Arley   Number of children: 3   Years of education: Not on file   Highest education level: Not on file  Occupational History   Not on file  Tobacco Use   Smoking status: Never   Smokeless tobacco: Never  Vaping Use   Vaping status: Never Used  Substance and Sexual Activity   Alcohol use: No   Drug use: No   Sexual activity: Not on file  Other Topics Concern   Not on file  Social History Narrative   Not on file   Social Drivers of Health   Tobacco Use: Low Risk (02/12/2024)   Patient History    Smoking Tobacco Use: Never    Smokeless Tobacco Use: Never    Passive Exposure: Not on file  Financial Resource Strain: Low Risk  (12/20/2023)   Received from [facility]   Overall Financial Resource Strain (CARDIA)     Difficulty of Paying Living Expenses: Not hard at all  Food Insecurity: No Food Insecurity (12/24/2023)   Epic    Worried About Radiation Protection Practitioner of Food in the Last Year: Never true    Ran Out of Food in the Last Year: Never true  Transportation Needs: No Transportation Needs (12/23/2023)   Epic    Lack of Transportation (Medical): No    Lack of Transportation (Non-Medical): No  Physical Activity: Not on file  Stress: Not on file  Social Connections: Moderately Isolated (12/23/2023)   Social Connection and Isolation  Panel    Frequency of Communication with Friends and Family: More than three times a week    Frequency of Social Gatherings with Friends and Family: More than three times a week    Attends Religious Services: More than 4 times per year    Active Member of [employer] or Organizations: No    Attends Banker Meetings: Never    Marital Status: Widowed  Intimate Partner Violence: Not At Risk (12/24/2023)   Epic    Fear of Current or Ex-Partner: No    Emotionally Abused: No    Physically Abused: No    Sexually Abused: No  Depression (PHQ2-9): Low Risk (11/14/2023)   Depression (PHQ2-9)    PHQ-2 Score: 0  Alcohol Screen: Not on file  Housing: Low Risk  (01/02/2024)   Received from [facility]   Epic    In the last 12 months, was there a time when you were not able to pay the mortgage or rent on time?: No    In the past 12 months, how many times have you moved where you were living?: 0    At any time in the past 12 months, were you homeless or living in a shelter (including now)?: No  Utilities: Not At Risk (12/24/2023)   Epic    Threatened with loss of utilities: No  Health Literacy: Not on file    Past Surgical History:  Procedure Laterality Date   BACK SURGERY  2011   LAMINECTOMY   BREAST BIOPSY Right    neg   CERVICAL FUSION  2011, 2012   X 2   COLONOSCOPY  2005, 2015   COLONOSCOPY WITH PROPOFOL  N/A 08/04/2020   Procedure: COLONOSCOPY WITH  PROPOFOL ;  Surgeon: Dessa Reyes ORN, MD;  Location: [facility] ENDOSCOPY;  Service: Endoscopy;  Laterality: N/A;   CORONARY STENT INTERVENTION N/A 10/02/2022   Procedure: CORONARY STENT INTERVENTION;  Surgeon: Darron Deatrice LABOR, MD;  Location: [facility] INVASIVE CV LAB;  Service: Cardiovascular;  Laterality: N/A;   ESOPHAGOGASTRODUODENOSCOPY (EGD) WITH PROPOFOL  N/A 09/26/2021   Procedure: ESOPHAGOGASTRODUODENOSCOPY (EGD) WITH PROPOFOL ;  Surgeon: Onita Elspeth Sharper, DO;  Location: [facility] ENDOSCOPY;  Service: Gastroenterology;  Laterality: N/A;   ESOPHAGOGASTRODUODENOSCOPY ENDOSCOPY  2005, 2013, 2015   EYE SURGERY Bilateral    Cataract Extraction with IOL    HERNIA REPAIR     JOINT REPLACEMENT     KNEE ARTHROPLASTY Left 07/12/2016   Procedure: COMPUTER ASSISTED TOTAL KNEE ARTHROPLASTY;  Surgeon: Mardee Lynwood SQUIBB, MD;  Location: [facility] ORS;  Service: Orthopedics;  Laterality: Left;   Laryngeal tear after intubation w/repair     LUMBAR DISC SURGERY  12/2009   REPLACEMENT TOTAL KNEE Right 06/2007   DR. HOOTEN, [facility]   REPLACEMENT UNICONDYLAR JOINT KNEE     DR. HOOTEN, [facility]   RIGHT/LEFT HEART CATH AND CORONARY ANGIOGRAPHY N/A 09/29/2022   Procedure: RIGHT/LEFT HEART CATH AND CORONARY ANGIOGRAPHY;  Surgeon: Darron Deatrice LABOR, MD;  Location: [facility] CV LAB;  Service: Cardiovascular;  Laterality: N/A;   RIGHT/LEFT HEART CATH AND CORONARY ANGIOGRAPHY N/A 12/27/2023   Procedure: RIGHT/LEFT HEART CATH AND CORONARY ANGIOGRAPHY;  Surgeon: Anner Alm ORN, MD;  Location: [facility] CV LAB;  Service: Cardiovascular;  Laterality: N/A;   SHOULDER ARTHROSCOPY WITH ROTATOR CUFF REPAIR Right    TONSILLECTOMY     TOTAL HIP ARTHROPLASTY Right 01/31/2017   Procedure: TOTAL HIP ARTHROPLASTY;  Surgeon: Mardee Lynwood SQUIBB, MD;  Location: [facility] OR[facility];  Service: Orthopedics;  Laterality: Right;  TUBAL LIGATION     UPPER GI ENDOSCOPY     VENTRAL HERNIA REPAIR N/A 09/04/2023   Procedure: REPAIR, HERNIA, VENTRAL;  Surgeon:  Marinda Jayson KIDD, MD;  Location: [facility] ORS;  Service: General;  Laterality: N/A;    Family History  Problem Relation Age of Onset   Asthma Mother    Breast cancer Mother 37   Emphysema Father        smoked   Lung cancer Father        smoked   Coronary artery disease Father 41    Allergies[1]     Latest Ref Rng & Units 12/28/2023    4:21 AM 12/27/2023    5:04 PM 12/27/2023    5:03 PM  CBC  WBC 4.0 - 10.5 K/uL 7.6     Hemoglobin 12.0 - 15.0 g/dL 89.0  89.4  89.0   Hematocrit 36.0 - 46.0 % 33.4  31.0  32.0   Platelets 150 - 400 K/uL 258         CMP     Component Value Date/Time   NA 134 (L) 12/27/2023 1704   NA 141 10/16/2022 1553   K 3.3 (L) 12/27/2023 1704   CL 103 12/27/2023 0455   CO2 25 12/27/2023 0455   GLUCOSE 92 12/27/2023 0455   BUN 10 12/27/2023 0455   BUN 12 10/16/2022 1553   CREATININE 1.40 (H) 12/27/2023 0455   CALCIUM  8.8 (L) 12/27/2023 0455   PROT 6.1 (L) 12/23/2023 1719   PROT 5.5 (L) 10/11/2022 1449   ALBUMIN 3.8 12/23/2023 1719   ALBUMIN 3.5 (L) 10/11/2022 1449   AST 28 12/23/2023 1719   ALT 25 12/23/2023 1719   ALKPHOS 91 12/23/2023 1719   BILITOT 0.7 12/23/2023 1719   BILITOT 0.4 10/11/2022 1449   EGFR 24 (L) 10/16/2022 1553   GFRNONAA 39 (L) 12/27/2023 0455     No results found.     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 Conservative management is first-line. - Recommended 20-30 mmHg knee-high compression stockings; advised 15-20 mmHg if intolerable. - Instructed to measure calf circumference for proper fit and consider purchasing from [employer] or a medical supply store. - Advised to avoid sleeping in compression stockings and to don them in the morning. - Advised regular leg elevation when inactive. - Encouraged continued  ambulation and activity as tolerated. - Discussed possible future consideration of a lymphedema pump if swelling persists. - Scheduled follow-up in several months to reassess swelling and response to therapy.  2. Deep vein thrombosis (DVT) of calf muscle vein of both lower extremities, unspecified chronicity (HCC) History of venous thromboembolism Recurrent VTE, including DVT and PE, with most recent event likely provoked by trauma and immobility. Lifelong anticoagulation indicated due to recurrent VTE. Risk of new thrombosis is very low with consistent Eliquis  use. - Advised continuation of Eliquis  for anticoagulation, likely lifelong. - Emphasized importance of medication adherence to minimize recurrent VTE risk. - Reassured that risk of new thrombosis is very low with consistent Eliquis  use. - Advised to ensure adequate Eliquis  supply at home, especially with impending snowstorm. - Advised that repeat imaging for PE is only indicated with new or worsening symptoms.     Medications Ordered Prior to Encounter[2]  There are no Patient Instructions on file for this visit. Return in about 3 months (around 05/08/2024) for no studies  JD/FB.   Orvin FORBES Daring, NP      [1]  Allergies Allergen Reactions   Amiodarone  Other (See Comments)    Fatigue, Nausea, anorexia, malaise   Lactose Other (See Comments) and Diarrhea   Other Other (See Comments)    Pt ONLY tolerates SLOW-FE (slow released iron )---upsets IBS   Biaxin [Clarithromycin] Other (See Comments)    GI upset    Hydrocodone -Acetaminophen  Itching    Tolerates acetaminophen     Oxycodone  Itching   Sulfa Antibiotics Diarrhea and Other (See Comments)    GI upset   Sulfasalazine Diarrhea  [2]  Current Outpatient Medications on File Prior to Visit  Medication Sig Dispense Refill   acetaminophen  (TYLENOL ) 500 MG tablet Take 1,000 mg by mouth 3 (three) times daily as needed for mild pain.      albuterol  (VENTOLIN  HFA) 108 (90 Base)  MCG/ACT inhaler Inhale 1-2 puffs into the lungs every 6 (six) hours as needed for wheezing or shortness of breath.     apixaban  (ELIQUIS ) 5 MG TABS tablet Take 2 tablets (10mg ) twice daily for 7 days, then 1 tablet (5mg ) twice daily 60 tablet 0   Cyanocobalamin 1000 MCG/ML KIT Inject as directed every 30 (thirty) days.     diclofenac  Sodium (VOLTAREN ) 1 % GEL Apply 2 g topically 4 (four) times daily as needed (joint/muscle ache). 100 g 0   diltiazem  (CARDIZEM  CD) 240 MG 24 hr capsule Take 1 capsule (240 mg total) by mouth daily. 90 capsule 3   diphenhydrAMINE  (BENADRYL ) 25 mg capsule Take 25 mg by mouth every 6 (six) hours as needed for itching (Take with Hydrocoone/APAP for itching.).     doxycycline  (VIBRAMYCIN ) 100 MG capsule Take 1 capsule (100 mg total) by mouth 2 (two) times daily. 14 capsule 0   Dupilumab 300 MG/2ML SOPN Inject 4 mLs into the skin every 14 (fourteen) days.     EPINEPHrine  0.3 mg/0.3 mL IJ SOAJ injection Inject 0.3 mg into the muscle as needed.     famotidine  (PEPCID ) 20 MG tablet Take 20 mg by mouth daily as needed.     gabapentin  (NEURONTIN ) 300 MG capsule Take 300 mg by mouth at bedtime as needed.     HYDROcodone -acetaminophen  (NORCO/VICODIN) 5-325 MG tablet Take 1 tablet by mouth every 4 (four) hours as needed. 15 tablet 0   ipratropium (ATROVENT) 0.03 % nasal spray Place 1-2 sprays into both nostrils daily.     levalbuterol  (XOPENEX ) 0.63 MG/3ML nebulizer solution Take 0.63 mg by nebulization every 6 (six) hours as needed for wheezing.     meclizine (ANTIVERT) 12.5 MG tablet Take 12.5 mg by mouth daily as needed for dizziness or nausea.     montelukast  (SINGULAIR ) 10 MG tablet Take 10 mg by mouth daily.     Multiple Vitamins-Minerals (PRESERVISION AREDS 2 PO) Take 2 tablets by mouth daily. Sometimes takes 2 at the same time     ondansetron  (ZOFRAN -ODT) 4 MG disintegrating tablet Take 1 tablet (4 mg total) by mouth every 8 (eight) hours as needed for nausea or vomiting. 20  tablet 0   pantoprazole  (PROTONIX ) 40 MG tablet Take 40 mg by mouth daily.     predniSONE  (STERAPRED UNI-PAK 21 TAB) 10 MG (21) TBPK tablet Take by mouth daily. Take 6 tabs by mouth daily  for 1 days, then 5 tabs for 1 days, then 4 tabs for 1 days, then 3 tabs for 1 days, 2 tabs for 1 days, then 1 tab by mouth daily for 1  days 21 tablet 0   rosuvastatin  (CRESTOR ) 5 MG tablet Take 1 tablet (5 mg total) by mouth daily. 90 tablet 3   sertraline  (ZOLOFT ) 50 MG tablet Take 50 mg by mouth daily.     SIMETHICONE  PO Take 250 mg by mouth as needed.     triamcinolone  (NASACORT ) 55 MCG/ACT AERO nasal inhaler Place 1 spray into the nose daily.     budesonide  (PULMICORT ) 0.25 MG/2ML nebulizer solution Take 2 mLs (0.25 mg total) by nebulization 2 (two) times daily. 120 mL 11   No current facility-administered medications on file prior to visit.   "

## 2024-02-12 ENCOUNTER — Encounter (INDEPENDENT_AMBULATORY_CARE_PROVIDER_SITE_OTHER): Payer: Self-pay | Admitting: Nurse Practitioner

## 2024-03-04 ENCOUNTER — Ambulatory Visit

## 2024-03-17 ENCOUNTER — Ambulatory Visit: Admitting: Internal Medicine

## 2024-03-27 ENCOUNTER — Encounter

## 2024-03-27 ENCOUNTER — Ambulatory Visit: Admitting: Pulmonary Disease

## 2024-04-02 ENCOUNTER — Ambulatory Visit: Admitting: Internal Medicine

## 2024-05-09 ENCOUNTER — Inpatient Hospital Stay

## 2024-05-12 ENCOUNTER — Inpatient Hospital Stay: Admitting: Oncology

## 2024-05-12 ENCOUNTER — Inpatient Hospital Stay
